# Patient Record
Sex: Female | Born: 1955 | Race: White | Hispanic: No | Marital: Married | Smoking: Former smoker
Health system: Southern US, Community
[De-identification: ages and names within clinical notes are randomized; demographics above are authoritative.]

## PROBLEM LIST (undated history)

## (undated) DIAGNOSIS — I480 Paroxysmal atrial fibrillation: Secondary | ICD-10-CM

## (undated) DIAGNOSIS — I10 Essential (primary) hypertension: Secondary | ICD-10-CM

## (undated) DIAGNOSIS — R011 Cardiac murmur, unspecified: Secondary | ICD-10-CM

## (undated) DIAGNOSIS — E119 Type 2 diabetes mellitus without complications: Secondary | ICD-10-CM

## (undated) DIAGNOSIS — G4730 Sleep apnea, unspecified: Secondary | ICD-10-CM

## (undated) DIAGNOSIS — F419 Anxiety disorder, unspecified: Secondary | ICD-10-CM

## (undated) DIAGNOSIS — M1990 Unspecified osteoarthritis, unspecified site: Secondary | ICD-10-CM

## (undated) DIAGNOSIS — K219 Gastro-esophageal reflux disease without esophagitis: Secondary | ICD-10-CM

## (undated) DIAGNOSIS — E079 Disorder of thyroid, unspecified: Secondary | ICD-10-CM

## (undated) DIAGNOSIS — T7840XA Allergy, unspecified, initial encounter: Secondary | ICD-10-CM

## (undated) DIAGNOSIS — I2510 Atherosclerotic heart disease of native coronary artery without angina pectoris: Secondary | ICD-10-CM

## (undated) DIAGNOSIS — J439 Emphysema, unspecified: Secondary | ICD-10-CM

## (undated) DIAGNOSIS — F988 Other specified behavioral and emotional disorders with onset usually occurring in childhood and adolescence: Secondary | ICD-10-CM

## (undated) HISTORY — PX: TUBAL LIGATION: SHX77

## (undated) HISTORY — DX: Allergy, unspecified, initial encounter: T78.40XA

## (undated) HISTORY — PX: EXCISIONAL HEMORRHOIDECTOMY: SHX1541

## (undated) HISTORY — PX: APPENDECTOMY: SHX54

## (undated) HISTORY — DX: Anxiety disorder, unspecified: F41.9

## (undated) HISTORY — DX: Unspecified osteoarthritis, unspecified site: M19.90

## (undated) HISTORY — DX: Sleep apnea, unspecified: G47.30

## (undated) HISTORY — PX: ABDOMINAL HYSTERECTOMY: SHX81

## (undated) HISTORY — DX: Disorder of thyroid, unspecified: E07.9

## (undated) HISTORY — DX: Emphysema, unspecified: J43.9

## (undated) HISTORY — DX: Gastro-esophageal reflux disease without esophagitis: K21.9

## (undated) HISTORY — PX: INCONTINENCE SURGERY: SHX676

## (undated) HISTORY — DX: Cardiac murmur, unspecified: R01.1

---

## 2011-09-29 ENCOUNTER — Other Ambulatory Visit (HOSPITAL_COMMUNITY): Payer: Self-pay | Admitting: Urology

## 2011-09-29 ENCOUNTER — Ambulatory Visit (HOSPITAL_COMMUNITY)
Admission: RE | Admit: 2011-09-29 | Discharge: 2011-09-29 | Disposition: A | Discharge status: Home / Self Care | Payer: BC Managed Care – PPO | Source: Ambulatory Visit | Attending: Urology | Admitting: Urology

## 2011-09-29 DIAGNOSIS — R109 Unspecified abdominal pain: Secondary | ICD-10-CM | POA: Insufficient documentation

## 2011-09-29 DIAGNOSIS — N200 Calculus of kidney: Secondary | ICD-10-CM

## 2011-11-03 DIAGNOSIS — E663 Overweight: Secondary | ICD-10-CM | POA: Insufficient documentation

## 2011-11-03 DIAGNOSIS — N209 Urinary calculus, unspecified: Secondary | ICD-10-CM | POA: Insufficient documentation

## 2011-11-03 DIAGNOSIS — N200 Calculus of kidney: Secondary | ICD-10-CM | POA: Insufficient documentation

## 2011-11-03 DIAGNOSIS — N393 Stress incontinence (female) (male): Secondary | ICD-10-CM | POA: Insufficient documentation

## 2011-11-03 DIAGNOSIS — K5900 Constipation, unspecified: Secondary | ICD-10-CM | POA: Insufficient documentation

## 2011-11-17 ENCOUNTER — Other Ambulatory Visit (HOSPITAL_COMMUNITY): Payer: Self-pay | Admitting: Urology

## 2011-11-17 DIAGNOSIS — N200 Calculus of kidney: Secondary | ICD-10-CM

## 2011-11-20 ENCOUNTER — Ambulatory Visit (HOSPITAL_COMMUNITY)
Admission: RE | Admit: 2011-11-20 | Discharge: 2011-11-20 | Disposition: A | Discharge status: Home / Self Care | Payer: BC Managed Care – PPO | Source: Ambulatory Visit | Attending: Urology | Admitting: Urology

## 2011-11-20 DIAGNOSIS — R109 Unspecified abdominal pain: Secondary | ICD-10-CM | POA: Insufficient documentation

## 2011-11-20 DIAGNOSIS — N200 Calculus of kidney: Secondary | ICD-10-CM | POA: Insufficient documentation

## 2012-07-09 ENCOUNTER — Encounter (HOSPITAL_COMMUNITY): Payer: Self-pay

## 2012-07-09 ENCOUNTER — Emergency Department (HOSPITAL_COMMUNITY)
Admission: EM | Admit: 2012-07-09 | Discharge: 2012-07-09 | Disposition: A | Discharge status: Home / Self Care | Payer: BC Managed Care – PPO | Attending: Emergency Medicine | Admitting: Emergency Medicine

## 2012-07-09 ENCOUNTER — Emergency Department (HOSPITAL_COMMUNITY): Payer: BC Managed Care – PPO

## 2012-07-09 DIAGNOSIS — R51 Headache: Secondary | ICD-10-CM | POA: Insufficient documentation

## 2012-07-09 DIAGNOSIS — R5383 Other fatigue: Secondary | ICD-10-CM | POA: Insufficient documentation

## 2012-07-09 DIAGNOSIS — Z79899 Other long term (current) drug therapy: Secondary | ICD-10-CM | POA: Insufficient documentation

## 2012-07-09 DIAGNOSIS — R42 Dizziness and giddiness: Secondary | ICD-10-CM | POA: Insufficient documentation

## 2012-07-09 DIAGNOSIS — E119 Type 2 diabetes mellitus without complications: Secondary | ICD-10-CM | POA: Insufficient documentation

## 2012-07-09 DIAGNOSIS — F988 Other specified behavioral and emotional disorders with onset usually occurring in childhood and adolescence: Secondary | ICD-10-CM | POA: Insufficient documentation

## 2012-07-09 DIAGNOSIS — R5381 Other malaise: Secondary | ICD-10-CM | POA: Insufficient documentation

## 2012-07-09 DIAGNOSIS — R55 Syncope and collapse: Secondary | ICD-10-CM | POA: Insufficient documentation

## 2012-07-09 DIAGNOSIS — R0602 Shortness of breath: Secondary | ICD-10-CM | POA: Insufficient documentation

## 2012-07-09 HISTORY — DX: Type 2 diabetes mellitus without complications: E11.9

## 2012-07-09 HISTORY — DX: Other specified behavioral and emotional disorders with onset usually occurring in childhood and adolescence: F98.8

## 2012-07-09 LAB — COMPREHENSIVE METABOLIC PANEL
Albumin: 3.6 g/dL (ref 3.5–5.2)
BUN: 12 mg/dL (ref 6–23)
Chloride: 101 mEq/L (ref 96–112)
Creatinine, Ser: 0.74 mg/dL (ref 0.50–1.10)
GFR calc Af Amer: >90 mL/min (ref >90)
GFR calc non Af Amer: >90 mL/min (ref >90)
Total Bilirubin: 0.2 mg/dL — ABNORMAL LOW (ref 0.3–1.2)

## 2012-07-09 LAB — CBC WITH DIFFERENTIAL/PLATELET
Basophils Relative: 0 % (ref 0–1)
Eosinophils Absolute: 0.1 10*3/uL (ref 0.0–0.7)
HCT: 38.3 % (ref 36.0–46.0)
Hemoglobin: 13.3 g/dL (ref 12.0–15.0)
MCH: 32.5 pg (ref 26.0–34.0)
MCHC: 34.7 g/dL (ref 30.0–36.0)
Monocytes Absolute: 0.6 10*3/uL (ref 0.1–1.0)
Monocytes Relative: 7 % (ref 3–12)
Neutro Abs: 4.3 10*3/uL (ref 1.7–7.7)

## 2012-07-09 LAB — TROPONIN I: Troponin I: <0.3 ng/mL (ref <0.30)

## 2012-07-09 LAB — PROTIME-INR: Prothrombin Time: 12.4 seconds (ref 11.6–15.2)

## 2012-07-09 LAB — MAGNESIUM: Magnesium: 1.6 mg/dL (ref 1.5–2.5)

## 2012-07-09 MED ORDER — SODIUM CHLORIDE 0.9 % IV SOLN
INTRAVENOUS | Status: DC
  Administered 2012-07-09: 18:00:00 via INTRAVENOUS

## 2012-07-09 MED ORDER — KETOROLAC TROMETHAMINE 30 MG/ML IJ SOLN: 30.0000 mg | Freq: Once | INTRAMUSCULAR | Status: DC

## 2012-07-09 MED ORDER — SODIUM CHLORIDE 0.9 % IV BOLUS (SEPSIS)
700.0000 mL | Freq: Once | INTRAVENOUS | Status: AC
  Administered 2012-07-09: 700 mL via INTRAVENOUS

## 2012-07-09 MED ORDER — DIPHENHYDRAMINE HCL 50 MG/ML IJ SOLN
25.0000 mg | Freq: Once | INTRAMUSCULAR | Status: AC
  Administered 2012-07-09: 25 mg via INTRAVENOUS
  Filled 2012-07-09: qty 1

## 2012-07-09 MED ORDER — MECLIZINE HCL 12.5 MG PO TABS
25.0000 mg | ORAL_TABLET | Freq: Once | ORAL | Status: AC
  Administered 2012-07-09: 25 mg via ORAL
  Filled 2012-07-09: qty 2

## 2012-07-09 MED ORDER — METOCLOPRAMIDE HCL 5 MG/ML IJ SOLN
10.0000 mg | Freq: Once | INTRAMUSCULAR | Status: AC
  Administered 2012-07-09: 10 mg via INTRAVENOUS
  Filled 2012-07-09: qty 2

## 2012-07-09 NOTE — ED Provider Notes (Signed)
History   This chart was scribed for [name], MD scribed by Magnus Sinning. The patient was seen in room APA14/[facility]14 at 17:15   CSN: [id number]  Arrival date & time 07/09/12  1658     Chief Complaint  Patient presents with  . Loss of Consciousness    (Consider location/radiation/quality/duration/timing/severity/associated sxs/prior treatment) Patient is a 56 y.o. female presenting with syncope. The history is provided by the patient. No language interpreter was used.  Loss of Consciousness   Rachel Odonnell is a 56 y.o. female who presents to the Emergency Department complaining of sudden onset brief syncopal episode with associated SOB, a current HA located at the back of the head that is more prominent on the right, and current weakness. She says this episode began approximately one hour ago. She says she got up this morning and has felt fine all day. 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 She notes that she had been doing usual work activity all day. Pt explains that she suddenly she felt like she was having lapses in concentration and explains she began seeing "dancing before her eyes and felt that she had no gravity."  She states she purposely leaned back against the wall.   She reports that an inmate said after the episode that  she just slid down the wall and sat down on her bottom. She explains she has no recollection of this event. Pt provides that when she woke up she reached up to grabbed the inmate's hand to help her get up and when she got up she felt her knees buckle.   Denies diaphoresis, CP, pale or flushed skin, nausea, vomiting, diarrhea, numbness, tingling, or blurred/double vision at the time of the dizziness episode.   Patient does report hx of vertigo spells since she was 21.y.o, but she states this situation felt different. She provides that she takes meclizine for vertigo, as well as, janumet, Lipitor, Claritin,  and a fish oil vitamin daily.   The patient explains she walked into the ED with husbands assistance, but states she feels like she has had problems with her balance. She states she still feels weak.  Pt notes hx of similar episode 1 month ago, but says the episode did not last long, at approximately 30 minutes. She says at prior episode, she was at home. She says she had nausea with prior episode and says that she did informed PCP. She states she did call Dr Sherril Croon tonight and he is going to see her in the office on Monday.   She reports having concentration problems and memory problems for a long time and Dr Sherril Croon started her on stratera and she had only had 1 or 2 doses when she had the first syncopal episode. She has now been on adderall a week.   PCP: Dr. Sherril Croon in [place]   Past Medical History  Diagnosis Date  . Diabetes mellitus without complication   . ADD (attention deficit disorder)     Past Surgical History  Procedure Date  . Incontinence surgery     No family history on file. Father had valve replacement with COPD. Currently living at 42 y.o.  Mother died at 7 of Cervical cancer that metastasized.  Sister had a benign tumor on lung, which was removed.   History  Substance Use Topics  . Smoking status: Not on file  . Smokeless tobacco: Not on file  .  Alcohol Use: No  Denies alcohol or smoking tobacco. Currently employed at correctional facility Lives at home Lives with spouse   Review of Systems  Cardiovascular: Positive for syncope.  All other systems reviewed and are negative.  10 Systems reviewed and are negative for acute change except as noted in the HPI.  Allergies  Review of patient's allergies indicates no known allergies.  Home Medications   Current Outpatient Rx  Name  Route  Sig  Dispense  Refill  . AMPHETAMINE-DEXTROAMPHETAMINE 10 MG PO TABS   Oral   Take 10 mg by mouth daily.         . ATORVASTATIN CALCIUM 20 MG PO TABS   Oral   Take 20 mg by  mouth daily.         Marland Kitchen CITALOPRAM HYDROBROMIDE 20 MG PO TABS   Oral   Take 20 mg by mouth daily.         Marland Kitchen ESTRADIOL 2 MG PO TABS   Oral   Take 2 mg by mouth daily.         Marland Kitchen LORATADINE 10 MG PO TABS   Oral   Take 10 mg by mouth daily.         Marland Kitchen SITAGLIPTIN-METFORMIN HCL 50-500 MG PO TABS   Oral   Take 1 tablet by mouth 2 (two) times daily.         Restless leg syndrome medication  BP 133/74  Pulse 62  Temp 98 F (36.7 C) (Oral)  Resp 18  Ht 5\' 4"  (1.626 m)  Wt 210 lb (95.255 kg)  BMI 36.05 kg/m2  SpO2 96%  Vital signs normal   Orthostatic VS normal with bradycardia   Physical Exam  Nursing note and vitals reviewed. Constitutional: She is oriented to person, place, and time. She appears well-developed and well-nourished. No distress.  HENT:  Head: Normocephalic and atraumatic.  Right Ear: External ear normal.  Left Ear: External ear normal.  Nose: Nose normal.  Mouth/Throat: Oropharynx is clear and moist.  Eyes: Conjunctivae normal and EOM are normal. Pupils are equal, round, and reactive to light.  Neck: Normal range of motion. Neck supple. No tracheal deviation present.       No carotid bruits  Cardiovascular: Normal rate.   Murmur heard.      Late systolic soft murmur. No carotid bruits.   Pulmonary/Chest: Effort normal and breath sounds normal. No respiratory distress. She has no wheezes. She has no rales. She exhibits no tenderness.  Abdominal: Soft. Bowel sounds are normal. She exhibits no distension. There is no tenderness. There is no rebound and no guarding.  Musculoskeletal: Normal range of motion. She exhibits no edema and no tenderness.  Neurological: She is alert and oriented to person, place, and time. No cranial nerve deficit or sensory deficit. Coordination normal.       No focal weakness. FTN was normal bilaterally. Grips equal bilaterally  Skin: Skin is warm and dry. No rash noted. No erythema. No pallor.  Psychiatric: She has a  normal mood and affect. Her behavior is normal.    ED Course  Procedures (including critical care time)   Medications  0.9 %  sodium chloride infusion (0  Intravenous Stopped 07/09/12 2016)  sodium chloride 0.9 % bolus 700 mL (0 mL Intravenous Stopped 07/09/12 2011)  meclizine (ANTIVERT) tablet 25 mg (25 mg Oral Given 07/09/12 1847)  metoCLOPramide (REGLAN) injection 10 mg (10 mg Intravenous Given 07/09/12 2016)  diphenhydrAMINE (BENADRYL) injection 25 mg (25  mg Intravenous Given 07/09/12 2016)     DIAGNOSTIC STUDIES: Oxygen Saturation is 96% on room air, normal by my interpretation.    COORDINATION OF CARE: 18:54: Performed recheck and provided that labs and radiology were all nml. Pt provides she walked to the bathroom unassisted and that she has felt better.   20:00 recheck patient ambulated to the bathroom easily. States her headache is still present.  20:40 nurse reports her headache is gone. Pt ready to go home.    Results for orders placed during the hospital encounter of 07/09/12  CBC WITH DIFFERENTIAL      Component Value Range   WBC 9.2  4.0 - 10.5 K/uL   RBC 4.09  3.87 - 5.11 MIL/uL   Hemoglobin 13.3  12.0 - 15.0 g/dL   HCT 16.1  09.6 - 04.5 %   MCV 93.6  78.0 - 100.0 fL   MCH 32.5  26.0 - 34.0 pg   MCHC 34.7  30.0 - 36.0 g/dL   RDW 40.9  81.1 - 91.4 %   Platelets 311  150 - 400 K/uL   Neutrophils Relative 47  43 - 77 %   Neutro Abs 4.3  1.7 - 7.7 K/uL   Lymphocytes Relative 45  12 - 46 %   Lymphs Abs 4.1 (*) 0.7 - 4.0 K/uL   Monocytes Relative 7  3 - 12 %   Monocytes Absolute 0.6  0.1 - 1.0 K/uL   Eosinophils Relative 1  0 - 5 %   Eosinophils Absolute 0.1  0.0 - 0.7 K/uL   Basophils Relative 0  0 - 1 %   Basophils Absolute 0.0  0.0 - 0.1 K/uL  COMPREHENSIVE METABOLIC PANEL      Component Value Range   Sodium 137  135 - 145 mEq/L   Potassium 3.9  3.5 - 5.1 mEq/L   Chloride 101  96 - 112 mEq/L   CO2 25  19 - 32 mEq/L   Glucose, Bld 146 (*) 70 - 99  mg/dL   BUN 12  6 - 23 mg/dL   Creatinine, Ser 7.82  0.50 - 1.10 mg/dL   Calcium 95.6  8.4 - 21.3 mg/dL   Total Protein 7.0  6.0 - 8.3 g/dL   Albumin 3.6  3.5 - 5.2 g/dL   AST 24  0 - 37 U/L   ALT 23  0 - 35 U/L   Alkaline Phosphatase 72  39 - 117 U/L   Total Bilirubin 0.2 (*) 0.3 - 1.2 mg/dL   GFR calc non Af Amer >90  >90 mL/min   GFR calc Af Amer >90  >90 mL/min  TROPONIN I      Component Value Range   Troponin I <0.30  <0.30 ng/mL  MAGNESIUM      Component Value Range   Magnesium 1.6  1.5 - 2.5 mg/dL  D-DIMER, QUANTITATIVE      Component Value Range   D-Dimer, Quant 0.40  0.00 - 0.48 ug/mL-FEU  APTT      Component Value Range   aPTT 26  24 - 37 seconds  PROTIME-INR      Component Value Range   Prothrombin Time 12.4  11.6 - 15.2 seconds   INR 0.93  0.00 - 1.49   Laboratory interpretation all normal except hyperglycemia   Dg Chest 1 View  07/09/2012  *RADIOLOGY REPORT*  Clinical Data: 56 year old female with syncope.  Altered level of consciousness.  CHEST - 1 VIEW  Comparison: CT abdomen  11/20/2011.  Findings: AP upright view at 1800 hours.  Lung volumes are within normal limits.  Cardiac size and mediastinal contours are within normal limits.  Visualized tracheal air column is within normal limits.  Allowing for portable technique, the lungs are clear.  No pneumothorax or effusion.  IMPRESSION: No acute cardiopulmonary abnormality.   Original Report Authenticated By: Erskine Speed, M.D.    Ct Head Wo Contrast  07/09/2012  *RADIOLOGY REPORT*  Clinical Data: 56 year old female syncope altered level of consciousness, weakness.  CT HEAD WITHOUT CONTRAST  Technique:  Contiguous axial images were obtained from the base of the skull through the vertex without contrast.  Comparison: None.  Findings: Visualized paranasal sinuses and mastoids are clear. Visualized orbits and scalp soft tissues are within normal limits. No acute osseous abnormality identified.  Mild calcified  atherosclerosis at the skull base.  Cerebral volume is within normal limits for age.  No midline shift, ventriculomegaly, mass effect, evidence of mass lesion, intracranial hemorrhage or evidence of cortically based acute infarction.  Gray-Adduci matter differentiation is within normal limits throughout the brain.  No suspicious intracranial vascular hyperdensity.  IMPRESSION: Normal noncontrast CT appearance of the brain.   Original Report Authenticated By: Erskine Speed, M.D.     Date: 07/09/2012  Rate: 63  Rhythm: normal sinus rhythm  QRS Axis: normal  Intervals: normal  ST/T Wave abnormalities: normal  Conduction Disutrbances:none  Narrative Interpretation:   Old EKG Reviewed: none available     1. Syncopal episodes   2. Headache     Plan discharge  Devoria Albe, MD, FACEP   MDM  I personally performed the services described in this documentation, which was scribed in my presence. The recorded information has been reviewed and considered.  Devoria Albe, MD, Armando Gang         [name], MD 07/09/12 [phone]

## 2012-07-09 NOTE — ED Notes (Signed)
Pt states she has a sudden onset of dizziness while at work. States she slid down the wall and everything went black. States she has a headache now and is unable to concentrate

## 2012-07-29 ENCOUNTER — Other Ambulatory Visit (HOSPITAL_COMMUNITY): Payer: Self-pay | Admitting: Internal Medicine

## 2012-07-29 ENCOUNTER — Ambulatory Visit (HOSPITAL_COMMUNITY)
Admission: RE | Admit: 2012-07-29 | Discharge: 2012-07-29 | Disposition: A | Discharge status: Home / Self Care | Payer: BC Managed Care – PPO | Source: Ambulatory Visit | Attending: Internal Medicine | Admitting: Internal Medicine

## 2012-07-29 DIAGNOSIS — R109 Unspecified abdominal pain: Secondary | ICD-10-CM

## 2012-07-29 DIAGNOSIS — K7689 Other specified diseases of liver: Secondary | ICD-10-CM | POA: Insufficient documentation

## 2012-08-02 ENCOUNTER — Other Ambulatory Visit (HOSPITAL_COMMUNITY): Payer: Self-pay | Admitting: Internal Medicine

## 2012-08-02 DIAGNOSIS — R109 Unspecified abdominal pain: Secondary | ICD-10-CM

## 2012-08-03 ENCOUNTER — Encounter (HOSPITAL_COMMUNITY)
Admission: RE | Admit: 2012-08-03 | Discharge: 2012-08-03 | Disposition: A | Discharge status: Home / Self Care | Payer: BC Managed Care – PPO | Source: Ambulatory Visit | Attending: Internal Medicine | Admitting: Internal Medicine

## 2012-08-03 ENCOUNTER — Other Ambulatory Visit (HOSPITAL_COMMUNITY): Payer: Self-pay | Admitting: Internal Medicine

## 2012-08-03 ENCOUNTER — Encounter (HOSPITAL_COMMUNITY): Payer: Self-pay

## 2012-08-03 DIAGNOSIS — R109 Unspecified abdominal pain: Secondary | ICD-10-CM | POA: Insufficient documentation

## 2012-08-03 HISTORY — DX: Essential (primary) hypertension: I10

## 2012-08-03 MED ORDER — TECHNETIUM TC 99M MEBROFENIN IV KIT
5.0000 | PACK | Freq: Once | INTRAVENOUS | Status: AC | PRN
  Administered 2012-08-03: 5 via INTRAVENOUS

## 2012-08-03 MED ORDER — MORPHINE SULFATE 2 MG/ML IJ SOLN
2.0000 mg | Freq: Once | INTRAMUSCULAR | Status: AC
  Administered 2012-08-03: 2 mg via INTRAVENOUS

## 2013-02-12 IMAGING — CR DG CHEST 1V
1 series · 1 of 1 positions shown · non-contrast
Comparison: CT abdomen 11/20/2011.

CLINICAL DATA: 56-year-old female with syncope.  Altered level of
consciousness.

CHEST - 1 VIEW

[view not recorded]
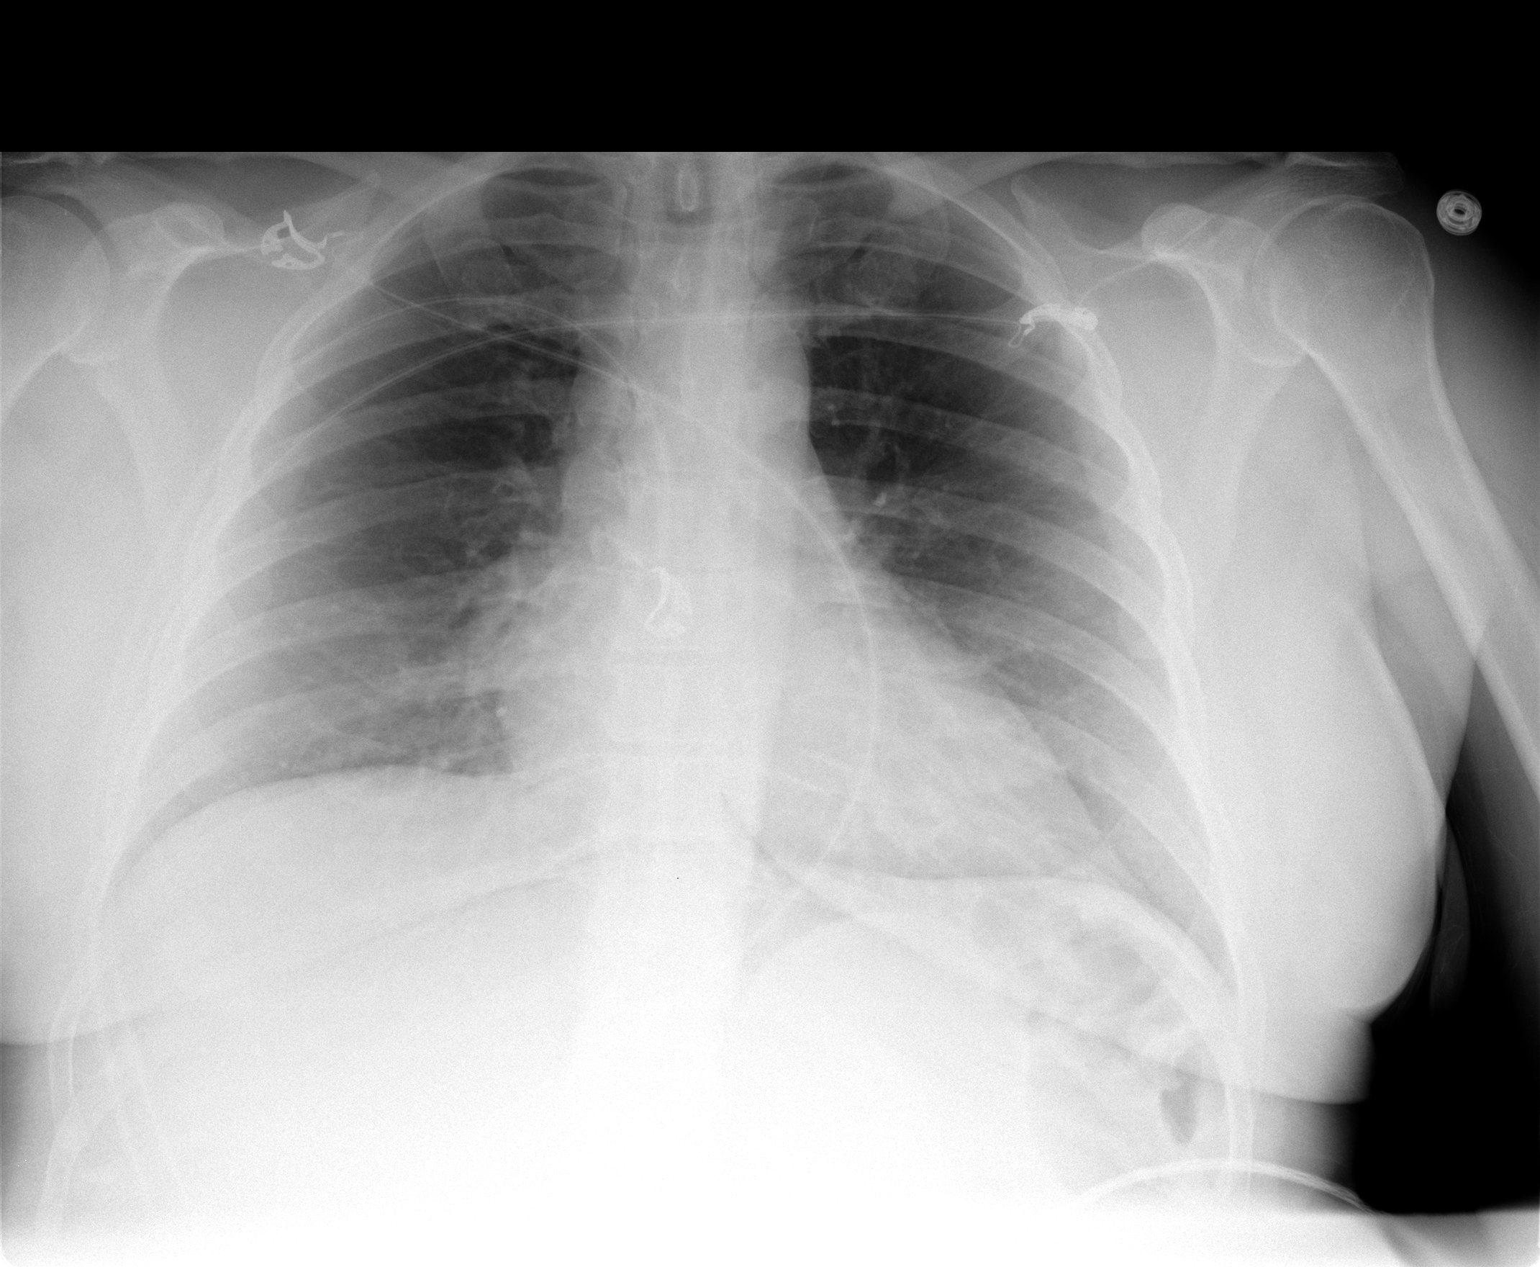

[1 of 1 positions shown; findings below may reference images not displayed]

FINDINGS: AP upright view at 3300 hours.  Lung volumes are within
normal limits.  Cardiac size and mediastinal contours are within
normal limits.  Visualized tracheal air column is within normal
limits.  Allowing for portable technique, the lungs are clear.  No
pneumothorax or effusion.
IMPRESSION: No acute cardiopulmonary abnormality.

## 2016-05-31 DIAGNOSIS — I10 Essential (primary) hypertension: Secondary | ICD-10-CM | POA: Insufficient documentation

## 2016-05-31 DIAGNOSIS — E119 Type 2 diabetes mellitus without complications: Secondary | ICD-10-CM | POA: Insufficient documentation

## 2016-05-31 DIAGNOSIS — E1159 Type 2 diabetes mellitus with other circulatory complications: Secondary | ICD-10-CM | POA: Insufficient documentation

## 2016-05-31 DIAGNOSIS — G43109 Migraine with aura, not intractable, without status migrainosus: Secondary | ICD-10-CM | POA: Insufficient documentation

## 2016-05-31 DIAGNOSIS — E1165 Type 2 diabetes mellitus with hyperglycemia: Secondary | ICD-10-CM | POA: Insufficient documentation

## 2018-03-03 ENCOUNTER — Encounter: Payer: Self-pay | Admitting: *Deleted

## 2018-03-03 ENCOUNTER — Other Ambulatory Visit: Payer: Self-pay

## 2018-03-03 ENCOUNTER — Ambulatory Visit (INDEPENDENT_AMBULATORY_CARE_PROVIDER_SITE_OTHER): Payer: BC Managed Care – PPO | Admitting: Cardiovascular Disease

## 2018-03-03 ENCOUNTER — Encounter: Payer: Self-pay | Admitting: Cardiovascular Disease

## 2018-03-03 VITALS — BP 112/67 | HR 69 | Ht 64.0 in | Wt 216.0 lb

## 2018-03-03 DIAGNOSIS — R079 Chest pain, unspecified: Secondary | ICD-10-CM | POA: Diagnosis not present

## 2018-03-03 DIAGNOSIS — R5383 Other fatigue: Secondary | ICD-10-CM | POA: Diagnosis not present

## 2018-03-03 DIAGNOSIS — R0609 Other forms of dyspnea: Secondary | ICD-10-CM

## 2018-03-03 DIAGNOSIS — E119 Type 2 diabetes mellitus without complications: Secondary | ICD-10-CM | POA: Diagnosis not present

## 2018-03-03 NOTE — Patient Instructions (Signed)
Medication Instructions:  Continue all current medications.  Labwork: none  Testing/Procedures:  Your physician has requested that you have a lexiscan myoview. For further information please visit [email or web address]. Please follow instruction sheet, as given.  Office will contact with results via phone or letter.    Follow-Up: Next available.    Any Other Special Instructions Will Be Listed Below (If Applicable).  If you need a refill on your cardiac medications before your next appointment, please call your pharmacy.  

## 2018-03-03 NOTE — Progress Notes (Signed)
CARDIOLOGY CONSULT NOTE  Patient ID: Rachel Odonnell MRN: [id number] DOB/AGE: 1956/02/16 62 y.o.  Admit date: (Not on file) Primary Physician: Ignatius SpeckingVyas, Dhruv B, MD Referring Physician: Ignatius SpeckingVyas, Dhruv B, MD  Reason for Consultation: Shortness of breath  HPI: Rachel KrebsKathy M Nesheim is a 62 y.o. female who is being seen today for the evaluation of shortness of breath at the request of Vyas, Angelina Pihhruv B, MD.   I reviewed records from her PCP.  She reported the underwent a normal stress test in June 2011 and a normal echocardiogram in March 2010.  I reviewed the echocardiogram report dated 12/10/2008.  [place] was performed in [place].  There appeared to be normal left ventricular systolic function and normal chamber size.  All valves were also normal.  I personally reviewed an ECG performed on 02/06/2010 which demonstrated sinus rhythm with nonspecific T wave abnormalities.  I reviewed carotid Dopplers dated 06/08/2016 which showed less than 50% bilateral internal carotid artery stenosis.  I personally reviewed another ECG performed on 02/14/2018 which demonstrated sinus rhythm with septal Q waves and nonspecific T wave abnormalities in leads III and aVF as well as V6.  She has been experiencing exertional dyspnea for the past month.  This can occur both at rest and with exertion or when going from the sitting to standing position.  She feels dizzy and feels like she is going to pass out.  She has been fatigued.  She even gets short of breath with talking which she began expensing about 3 weeks ago.  She works part-time [facility] [employer] and has had to take herself out of work.  She has occasional sharp retrosternal chest pains lasting seconds.  She has also had bilateral leg and feet swelling.  She has restless leg syndrome as well.  She has a history of type 2 diabetes mellitus and her most recent A1c was 8.9%.  This morning when helping her daughter low teaching supplies into the car she felt short of  breath and had to sit down.  She quit smoking in 1996.  She tells me she underwent an echocardiogram, chest x-ray, and had labs done all within the past several weeks.  I will have to request a copy of these results.  Family history: Father underwent CABG in his late 60s/early 1570s.  He died at the age of 63.  He had COPD.  Her mother had cancer.  Social history: [place] in [place].  She is divorced and remarried.  She quit smoking in 1996.   Allergies  Allergen Reactions  . Azithromycin Hives  . Codeine Other (See Comments)  . Atomoxetine Nausea And Vomiting  . Atorvastatin Other (See Comments)    Restless legs  . Lisinopril Nausea And Vomiting    Current Outpatient Medications  Medication Sig Dispense Refill  . citalopram (CELEXA) 20 MG tablet Take 20 mg by mouth daily.    Marland Kitchen. estradiol (ESTRACE) 2 MG tablet Take 2 mg by mouth daily.    Marland Kitchen. loratadine (CLARITIN) 10 MG tablet Take 10 mg by mouth daily.    . sitaGLIPtan-metformin (JANUMET) 50-500 MG per tablet Take 1 tablet by mouth 2 (two) times daily.    . TRULICITY 0.75 MG/0.5ML SOPN     . amphetamine-dextroamphetamine (ADDERALL) 10 MG tablet Take 10 mg by mouth daily.     No current facility-administered medications for this visit.     Past Medical History:  Diagnosis Date  .  ADD (attention deficit disorder)   . Diabetes mellitus without complication (HCC)   . Hypertension     Past Surgical History:  Procedure Laterality Date  . INCONTINENCE SURGERY      Social History   Socioeconomic History  . Marital status: Married    Spouse name: Not on file  . Number of children: Not on file  . Years of education: Not on file  . Highest education level: Not on file  Occupational History  . Not on file  Social Needs  . Financial resource strain: Not on file  . Food insecurity:    Worry: Not on file    Inability: Not on file  . Transportation needs:    Medical: Not on file    Non-medical: Not on file    Tobacco Use  . Smoking status: Never Smoker  . Smokeless tobacco: Never Used  Substance and Sexual Activity  . Alcohol use: No  . Drug use: No  . Sexual activity: Not on file  Lifestyle  . Physical activity:    Days per week: Not on file    Minutes per session: Not on file  . Stress: Not on file  Relationships  . Social connections:    Talks on phone: Not on file    Gets together: Not on file    Attends religious service: Not on file    Active member of club or organization: Not on file    Attends meetings of clubs or organizations: Not on file    Relationship status: Not on file  . Intimate partner violence:    Fear of current or ex partner: Not on file    Emotionally abused: Not on file    Physically abused: Not on file    Forced sexual activity: Not on file  Other Topics Concern  . Not on file  Social History Narrative  . Not on file     Current Meds  Medication Sig  . citalopram (CELEXA) 20 MG tablet Take 20 mg by mouth daily.  Marland Kitchen estradiol (ESTRACE) 2 MG tablet Take 2 mg by mouth daily.  Marland Kitchen loratadine (CLARITIN) 10 MG tablet Take 10 mg by mouth daily.  . sitaGLIPtan-metformin (JANUMET) 50-500 MG per tablet Take 1 tablet by mouth 2 (two) times daily.  . TRULICITY 0.75 MG/0.5ML SOPN       Review of systems complete and found to be negative unless listed above in HPI    Physical exam Height 5\' 4"  (1.626 m), weight 216 lb (98 kg). General: NAD Neck: No JVD, no thyromegaly or thyroid nodule.  Lungs: Clear to auscultation bilaterally with normal respiratory effort. CV: Nondisplaced PMI. Regular rate and rhythm, normal S1/S2, no S3/S4, 2/6 systolic murmur over bilateral upper sternal borders, right greater than left.  No peripheral edema.  No carotid bruit.   Abdomen: Soft, nontender, no distention.  Skin: Intact without lesions or rashes.  Neurologic: Alert and oriented x 3.  Psych: Normal affect. Extremities: No clubbing or cyanosis.  HEENT: Normal.   ECG:  Most recent ECG reviewed.   Labs: Lab Results  Component Value Date/Time   K 3.9 07/09/2012 05:36 PM   BUN 12 07/09/2012 05:36 PM   CREATININE 0.74 07/09/2012 05:36 PM   ALT 23 07/09/2012 05:36 PM   HGB 13.3 07/09/2012 05:36 PM     Lipids: No results found for: LDLCALC, LDLDIRECT, CHOL, TRIG, HDL      ASSESSMENT AND PLAN:  1.  Exertional dyspnea with intermittent chest pains  and fatigue: Uncertain etiology but she carries no history of pulmonary disease and quit smoking decades ago.  Given her history of type 2 diabetes mellitus, she is certainly at risk for ischemic heart disease. I will proceed with a nuclear myocardial perfusion imaging study to evaluate for ischemic heart disease (Lexiscan Myoview). I will also request echocardiogram report, chest x-ray report, and copy of most recent labs from her PCP.  2. Type 2 diabetes mellitus: This is poorly controlled with most recent A1c of 8.9%.  She takes Janumet and Trulicity.     Disposition: Follow up in 2 months   Signed: Prentice Docker, M.D., F.A.C.C.  03/03/2018, 1:56 PM

## 2018-03-04 ENCOUNTER — Telehealth: Payer: Self-pay | Admitting: Cardiovascular Disease

## 2018-03-04 NOTE — Telephone Encounter (Signed)
Pre-cert Verification for the following procedure   [facility]

## 2018-03-11 ENCOUNTER — Encounter (HOSPITAL_BASED_OUTPATIENT_CLINIC_OR_DEPARTMENT_OTHER)
Admission: RE | Admit: 2018-03-11 | Discharge: 2018-03-11 | Disposition: A | Discharge status: Home / Self Care | Payer: BC Managed Care – PPO | Source: Ambulatory Visit | Attending: Cardiovascular Disease | Admitting: Cardiovascular Disease

## 2018-03-11 ENCOUNTER — Encounter (HOSPITAL_COMMUNITY): Payer: Self-pay

## 2018-03-11 ENCOUNTER — Ambulatory Visit (HOSPITAL_COMMUNITY)
Admission: RE | Admit: 2018-03-11 | Discharge: 2018-03-11 | Disposition: A | Discharge status: Home / Self Care | Payer: BC Managed Care – PPO | Source: Ambulatory Visit | Attending: Cardiovascular Disease | Admitting: Cardiovascular Disease

## 2018-03-11 DIAGNOSIS — R0609 Other forms of dyspnea: Secondary | ICD-10-CM

## 2018-03-11 LAB — NM MYOCAR MULTI W/SPECT W/WALL MOTION / EF
Peak HR: 88 {beats}/min
Rest HR: 61 {beats}/min

## 2018-03-11 MED ORDER — TECHNETIUM TC 99M TETROFOSMIN IV KIT
30.0000 | PACK | Freq: Once | INTRAVENOUS | Status: AC | PRN
  Administered 2018-03-11: 32 via INTRAVENOUS

## 2018-03-11 MED ORDER — SODIUM CHLORIDE 0.9% FLUSH
INTRAVENOUS | Status: AC
  Administered 2018-03-11: 10 mL via INTRAVENOUS
  Filled 2018-03-11: qty 10

## 2018-03-11 MED ORDER — TECHNETIUM TC 99M TETROFOSMIN IV KIT
10.0000 | PACK | Freq: Once | INTRAVENOUS | Status: AC | PRN
  Administered 2018-03-11: 9.89 via INTRAVENOUS

## 2018-03-11 MED ORDER — REGADENOSON 0.4 MG/5ML IV SOLN
INTRAVENOUS | Status: AC
  Administered 2018-03-11: 0.4 mg via INTRAVENOUS
  Filled 2018-03-11: qty 5

## 2018-03-14 ENCOUNTER — Telehealth: Payer: Self-pay

## 2018-03-14 NOTE — Telephone Encounter (Signed)
Patient notified. Routed to PCP 

## 2018-03-14 NOTE — Telephone Encounter (Signed)
-----   Message from Antoine PocheJonathan F Branch, MD sent at 03/14/2018  1:41 PM EDT ----- Stress test looks good, no evidence of blockages. Dr Kirtland BouchardK to discuss in detail at f/u next week, reassess symptoms   J BrancH MD

## 2018-03-21 ENCOUNTER — Encounter: Payer: Self-pay | Admitting: Cardiovascular Disease

## 2018-03-21 ENCOUNTER — Ambulatory Visit (INDEPENDENT_AMBULATORY_CARE_PROVIDER_SITE_OTHER): Payer: BC Managed Care – PPO | Admitting: Cardiovascular Disease

## 2018-03-21 VITALS — BP 122/64 | HR 64 | Ht 64.0 in | Wt 209.0 lb

## 2018-03-21 DIAGNOSIS — E119 Type 2 diabetes mellitus without complications: Secondary | ICD-10-CM

## 2018-03-21 DIAGNOSIS — R0609 Other forms of dyspnea: Secondary | ICD-10-CM | POA: Diagnosis not present

## 2018-03-21 DIAGNOSIS — R5383 Other fatigue: Secondary | ICD-10-CM

## 2018-03-21 DIAGNOSIS — R079 Chest pain, unspecified: Secondary | ICD-10-CM | POA: Diagnosis not present

## 2018-03-21 NOTE — Patient Instructions (Signed)
Medication Instructions:  Continue all current medications.  Labwork: none  Testing/Procedures: none  Follow-Up: As needed.    Any Other Special Instructions Will Be Listed Below (If Applicable).  If you need a refill on your cardiac medications before your next appointment, please call your pharmacy.  

## 2018-03-21 NOTE — Progress Notes (Signed)
SUBJECTIVE: The patient returns for follow-up after undergoing cardiovascular testing performed for the evaluation of chest pain and exertional dyspnea.  Nuclear stress test on 03/11/2018 was normal, EF 55 to 65%.  Since her last visit with me, she has had one mild episode of retrosternal chest tightness radiating down the left arm.  Her exertional dyspnea has improved.  She had a sleep study several years ago and was told she did not have sleep apnea.  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  She believes this is taken a toll on her mental health.     Review of Systems: As per "subjective", otherwise negative.  Allergies  Allergen Reactions  . Azithromycin Hives  . Codeine Other (See Comments)  . Atomoxetine Nausea And Vomiting  . Atorvastatin Other (See Comments)    Restless legs  . Lisinopril Nausea And Vomiting    Current Outpatient Medications  Medication Sig Dispense Refill  . amphetamine-dextroamphetamine (ADDERALL) 10 MG tablet Take 10 mg by mouth daily.    . citalopram (CELEXA) 20 MG tablet Take 20 mg by mouth daily.    . dapagliflozin propanediol (FARXIGA) 10 MG TABS tablet Take 10 mg by mouth daily.    Marland Kitchen estradiol (ESTRACE) 2 MG tablet Take 2 mg by mouth daily.    . fenofibrate (TRICOR) 145 MG tablet Take 145 mg by mouth daily.    Marland Kitchen levocetirizine (XYZAL) 5 MG tablet Take 5 mg by mouth every evening.    . loratadine (CLARITIN) 10 MG tablet Take 10 mg by mouth daily.    . montelukast (SINGULAIR) 10 MG tablet Take 10 mg by mouth at bedtime.    Marland Kitchen omeprazole (PRILOSEC) 40 MG capsule Take 40 mg by mouth daily.    . pregabalin (LYRICA) 50 MG capsule Take 50 mg by mouth daily.    Marland Kitchen rOPINIRole (REQUIP) 2 MG tablet Take 2 mg by mouth at bedtime.    . sitaGLIPtan-metformin (JANUMET) 50-500 MG per tablet Take 1 tablet by mouth 2 (two) times daily.    . TRULICITY 0.75  MG/0.5ML SOPN      No current facility-administered medications for this visit.     Past Medical History:  Diagnosis Date  . ADD (attention deficit disorder)   . Diabetes mellitus without complication (HCC)   . Hypertension     Past Surgical History:  Procedure Laterality Date  . INCONTINENCE SURGERY      Social History   Socioeconomic History  . Marital status: Married    Spouse name: Not on file  . Number of children: Not on file  . Years of education: Not on file  . Highest education level: Not on file  Occupational History  . Not on file  Social Needs  . Financial resource strain: Not on file  . Food insecurity:    Worry: Not on file    Inability: Not on file  . Transportation needs:    Medical: Not on file    Non-medical: Not on file  Tobacco Use  . Smoking status: Never Smoker  . Smokeless tobacco: Never Used  Substance and Sexual Activity  . Alcohol use: No  . Drug use: No  . Sexual activity: Not on file  Lifestyle  . Physical activity:    Days per week: Not on file    Minutes per session: Not on file  .  Stress: Not on file  Relationships  . Social connections:    Talks on phone: Not on file    Gets together: Not on file    Attends religious service: Not on file    Active member of club or organization: Not on file    Attends meetings of clubs or organizations: Not on file    Relationship status: Not on file  . Intimate partner violence:    Fear of current or ex partner: Not on file    Emotionally abused: Not on file    Physically abused: Not on file    Forced sexual activity: Not on file  Other Topics Concern  . Not on file  Social History Narrative  . Not on file     Vitals:   03/21/18 1333  BP: 122/64  Pulse: 64  SpO2: 98%  Weight: 209 lb (94.8 kg)  Height: 5\' 4"  (1.626 m)    Wt Readings from Last 3 Encounters:  03/21/18 209 lb (94.8 kg)  03/03/18 216 lb (98 kg)  07/09/12 210 lb (95.3 kg)     PHYSICAL EXAM General:  NAD HEENT: Normal. Neck: No JVD, no thyromegaly. Lungs: Clear to auscultation bilaterally with normal respiratory effort. CV: Regular rate and rhythm, normal S1/S2, no S3/S4, no murmur. No pretibial or periankle edema.  No carotid bruit.   Abdomen: Soft, nontender, no distention.  Neurologic: Alert and oriented.  Psych: Normal affect. Skin: Normal. Musculoskeletal: No gross deformities.    ECG: Reviewed above under Subjective   Labs: Lab Results  Component Value Date/Time   K 3.9 07/09/2012 05:36 PM   BUN 12 07/09/2012 05:36 PM   CREATININE 0.74 07/09/2012 05:36 PM   ALT 23 07/09/2012 05:36 PM   HGB 13.3 07/09/2012 05:36 PM     Lipids: No results found for: LDLCALC, LDLDIRECT, CHOL, TRIG, HDL     ASSESSMENT AND PLAN: 1.  Exertional dyspnea with intermittent chest pains: Nuclear stress test was normal as noted above.  Symptoms have improved since her last visit.  I told her to contact me should she have recurrent and progressive symptoms.  2.  Type 2 diabetes mellitus: This is poorly controlled with most recent A1c of 8.9%.  She takes Janumet and Trulicity.      Disposition: Follow up prn   Prentice DockerSuresh Koneswaran, M.D., F.A.C.C.

## 2018-10-15 IMAGING — NM NM MYOCAR MULTI W/SPECT W/WALL MOTION & EF
2 series · 12 of 12 positions shown · non-contrast
Comparison: none

[Series 1: rest · 6.51mm/px · 6 of 64 frames shown]
[frame 6/64]
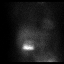
[frame 16/64]
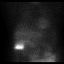
[frame 27/64]
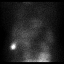
[frame 38/64]
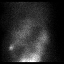
[frame 48/64]
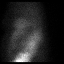
[frame 59/64]
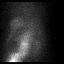

[Series 3: stress gated - perfusion · 6.51mm/px · 6 of 64 frames shown]
[frame 6/64]
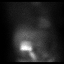
[frame 16/64]
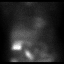
[frame 27/64]
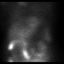
[frame 38/64]
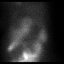
[frame 48/64]
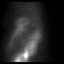
[frame 59/64]
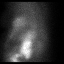

[12 of 12 positions shown; findings below may reference images not displayed]

Canned report from images found in remote index.

Refer to host system for actual result text.

## 2019-02-13 ENCOUNTER — Other Ambulatory Visit: Payer: Self-pay

## 2019-02-13 ENCOUNTER — Other Ambulatory Visit: Payer: Self-pay | Admitting: Internal Medicine

## 2019-02-13 DIAGNOSIS — Z20822 Contact with and (suspected) exposure to covid-19: Secondary | ICD-10-CM

## 2019-02-18 LAB — NOVEL CORONAVIRUS, NAA: SARS-CoV-2, NAA: NOT DETECTED

## 2019-11-28 ENCOUNTER — Institutional Professional Consult (permissible substitution): Payer: BC Managed Care – PPO | Admitting: Neurology

## 2020-05-22 DIAGNOSIS — Z87891 Personal history of nicotine dependence: Secondary | ICD-10-CM | POA: Diagnosis not present

## 2020-05-22 DIAGNOSIS — S60222A Contusion of left hand, initial encounter: Secondary | ICD-10-CM | POA: Diagnosis not present

## 2020-05-22 DIAGNOSIS — Z23 Encounter for immunization: Secondary | ICD-10-CM | POA: Diagnosis not present

## 2020-05-22 DIAGNOSIS — S80212A Abrasion, left knee, initial encounter: Secondary | ICD-10-CM | POA: Diagnosis not present

## 2020-05-22 DIAGNOSIS — W010XXA Fall on same level from slipping, tripping and stumbling without subsequent striking against object, initial encounter: Secondary | ICD-10-CM | POA: Diagnosis not present

## 2020-05-22 DIAGNOSIS — S0990XA Unspecified injury of head, initial encounter: Secondary | ICD-10-CM | POA: Diagnosis not present

## 2020-05-22 DIAGNOSIS — S60221A Contusion of right hand, initial encounter: Secondary | ICD-10-CM | POA: Diagnosis not present

## 2020-05-22 DIAGNOSIS — M542 Cervicalgia: Secondary | ICD-10-CM | POA: Diagnosis not present

## 2020-05-22 DIAGNOSIS — M79642 Pain in left hand: Secondary | ICD-10-CM | POA: Diagnosis not present

## 2020-05-27 DIAGNOSIS — R69 Illness, unspecified: Secondary | ICD-10-CM | POA: Diagnosis not present

## 2020-05-28 DIAGNOSIS — Z0001 Encounter for general adult medical examination with abnormal findings: Secondary | ICD-10-CM | POA: Diagnosis not present

## 2020-05-28 DIAGNOSIS — Z1329 Encounter for screening for other suspected endocrine disorder: Secondary | ICD-10-CM | POA: Diagnosis not present

## 2020-05-28 DIAGNOSIS — E039 Hypothyroidism, unspecified: Secondary | ICD-10-CM | POA: Diagnosis not present

## 2020-05-28 DIAGNOSIS — Z131 Encounter for screening for diabetes mellitus: Secondary | ICD-10-CM | POA: Diagnosis not present

## 2020-05-28 DIAGNOSIS — E119 Type 2 diabetes mellitus without complications: Secondary | ICD-10-CM | POA: Diagnosis not present

## 2020-05-28 DIAGNOSIS — Z13220 Encounter for screening for lipoid disorders: Secondary | ICD-10-CM | POA: Diagnosis not present

## 2020-05-28 DIAGNOSIS — E782 Mixed hyperlipidemia: Secondary | ICD-10-CM | POA: Diagnosis not present

## 2020-05-31 DIAGNOSIS — R69 Illness, unspecified: Secondary | ICD-10-CM | POA: Diagnosis not present

## 2020-05-31 DIAGNOSIS — Z6836 Body mass index (BMI) 36.0-36.9, adult: Secondary | ICD-10-CM | POA: Diagnosis not present

## 2020-05-31 DIAGNOSIS — K219 Gastro-esophageal reflux disease without esophagitis: Secondary | ICD-10-CM | POA: Diagnosis not present

## 2020-05-31 DIAGNOSIS — E785 Hyperlipidemia, unspecified: Secondary | ICD-10-CM | POA: Diagnosis not present

## 2020-05-31 DIAGNOSIS — Z0000 Encounter for general adult medical examination without abnormal findings: Secondary | ICD-10-CM | POA: Diagnosis not present

## 2020-05-31 DIAGNOSIS — J309 Allergic rhinitis, unspecified: Secondary | ICD-10-CM | POA: Diagnosis not present

## 2020-05-31 DIAGNOSIS — Z23 Encounter for immunization: Secondary | ICD-10-CM | POA: Diagnosis not present

## 2020-05-31 DIAGNOSIS — E1165 Type 2 diabetes mellitus with hyperglycemia: Secondary | ICD-10-CM | POA: Diagnosis not present

## 2020-06-13 DIAGNOSIS — Z23 Encounter for immunization: Secondary | ICD-10-CM | POA: Diagnosis not present

## 2020-07-03 DIAGNOSIS — H2513 Age-related nuclear cataract, bilateral: Secondary | ICD-10-CM | POA: Diagnosis not present

## 2020-07-03 DIAGNOSIS — H524 Presbyopia: Secondary | ICD-10-CM | POA: Diagnosis not present

## 2020-07-03 DIAGNOSIS — H5203 Hypermetropia, bilateral: Secondary | ICD-10-CM | POA: Diagnosis not present

## 2020-07-03 DIAGNOSIS — H52223 Regular astigmatism, bilateral: Secondary | ICD-10-CM | POA: Diagnosis not present

## 2020-07-03 DIAGNOSIS — Z7984 Long term (current) use of oral hypoglycemic drugs: Secondary | ICD-10-CM | POA: Diagnosis not present

## 2020-07-03 DIAGNOSIS — E119 Type 2 diabetes mellitus without complications: Secondary | ICD-10-CM | POA: Diagnosis not present

## 2020-07-03 DIAGNOSIS — Z0100 Encounter for examination of eyes and vision without abnormal findings: Secondary | ICD-10-CM | POA: Diagnosis not present

## 2020-07-15 DIAGNOSIS — Z1329 Encounter for screening for other suspected endocrine disorder: Secondary | ICD-10-CM | POA: Diagnosis not present

## 2020-07-15 DIAGNOSIS — E039 Hypothyroidism, unspecified: Secondary | ICD-10-CM | POA: Diagnosis not present

## 2020-07-30 DIAGNOSIS — Z1231 Encounter for screening mammogram for malignant neoplasm of breast: Secondary | ICD-10-CM | POA: Diagnosis not present

## 2020-08-01 DIAGNOSIS — R69 Illness, unspecified: Secondary | ICD-10-CM | POA: Diagnosis not present

## 2020-08-23 DIAGNOSIS — Z20828 Contact with and (suspected) exposure to other viral communicable diseases: Secondary | ICD-10-CM | POA: Diagnosis not present

## 2020-09-03 DIAGNOSIS — E039 Hypothyroidism, unspecified: Secondary | ICD-10-CM | POA: Diagnosis not present

## 2020-09-03 DIAGNOSIS — Z6837 Body mass index (BMI) 37.0-37.9, adult: Secondary | ICD-10-CM | POA: Diagnosis not present

## 2020-09-03 DIAGNOSIS — J309 Allergic rhinitis, unspecified: Secondary | ICD-10-CM | POA: Diagnosis not present

## 2020-09-03 DIAGNOSIS — E1165 Type 2 diabetes mellitus with hyperglycemia: Secondary | ICD-10-CM | POA: Diagnosis not present

## 2020-09-03 DIAGNOSIS — E785 Hyperlipidemia, unspecified: Secondary | ICD-10-CM | POA: Diagnosis not present

## 2020-09-03 DIAGNOSIS — K219 Gastro-esophageal reflux disease without esophagitis: Secondary | ICD-10-CM | POA: Diagnosis not present

## 2020-09-03 DIAGNOSIS — R69 Illness, unspecified: Secondary | ICD-10-CM | POA: Diagnosis not present

## 2020-11-28 DIAGNOSIS — Z6836 Body mass index (BMI) 36.0-36.9, adult: Secondary | ICD-10-CM | POA: Diagnosis not present

## 2020-11-28 DIAGNOSIS — E785 Hyperlipidemia, unspecified: Secondary | ICD-10-CM | POA: Diagnosis not present

## 2020-11-28 DIAGNOSIS — R69 Illness, unspecified: Secondary | ICD-10-CM | POA: Diagnosis not present

## 2020-11-28 DIAGNOSIS — E1165 Type 2 diabetes mellitus with hyperglycemia: Secondary | ICD-10-CM | POA: Diagnosis not present

## 2020-11-28 DIAGNOSIS — J309 Allergic rhinitis, unspecified: Secondary | ICD-10-CM | POA: Diagnosis not present

## 2020-11-28 DIAGNOSIS — E039 Hypothyroidism, unspecified: Secondary | ICD-10-CM | POA: Diagnosis not present

## 2020-11-28 DIAGNOSIS — K219 Gastro-esophageal reflux disease without esophagitis: Secondary | ICD-10-CM | POA: Diagnosis not present

## 2020-11-28 DIAGNOSIS — Z1331 Encounter for screening for depression: Secondary | ICD-10-CM | POA: Diagnosis not present

## 2020-11-28 DIAGNOSIS — Z1389 Encounter for screening for other disorder: Secondary | ICD-10-CM | POA: Diagnosis not present

## 2020-12-20 DIAGNOSIS — G4701 Insomnia due to medical condition: Secondary | ICD-10-CM | POA: Diagnosis not present

## 2020-12-20 DIAGNOSIS — G4733 Obstructive sleep apnea (adult) (pediatric): Secondary | ICD-10-CM | POA: Diagnosis not present

## 2021-01-02 DIAGNOSIS — M9903 Segmental and somatic dysfunction of lumbar region: Secondary | ICD-10-CM | POA: Diagnosis not present

## 2021-01-02 DIAGNOSIS — S338XXA Sprain of other parts of lumbar spine and pelvis, initial encounter: Secondary | ICD-10-CM | POA: Diagnosis not present

## 2021-01-02 DIAGNOSIS — S134XXA Sprain of ligaments of cervical spine, initial encounter: Secondary | ICD-10-CM | POA: Diagnosis not present

## 2021-01-02 DIAGNOSIS — S233XXA Sprain of ligaments of thoracic spine, initial encounter: Secondary | ICD-10-CM | POA: Diagnosis not present

## 2021-01-02 DIAGNOSIS — M9902 Segmental and somatic dysfunction of thoracic region: Secondary | ICD-10-CM | POA: Diagnosis not present

## 2021-01-02 DIAGNOSIS — M9901 Segmental and somatic dysfunction of cervical region: Secondary | ICD-10-CM | POA: Diagnosis not present

## 2021-01-08 DIAGNOSIS — M9901 Segmental and somatic dysfunction of cervical region: Secondary | ICD-10-CM | POA: Diagnosis not present

## 2021-01-08 DIAGNOSIS — M9902 Segmental and somatic dysfunction of thoracic region: Secondary | ICD-10-CM | POA: Diagnosis not present

## 2021-01-08 DIAGNOSIS — S134XXA Sprain of ligaments of cervical spine, initial encounter: Secondary | ICD-10-CM | POA: Diagnosis not present

## 2021-01-08 DIAGNOSIS — M9903 Segmental and somatic dysfunction of lumbar region: Secondary | ICD-10-CM | POA: Diagnosis not present

## 2021-01-08 DIAGNOSIS — S233XXA Sprain of ligaments of thoracic spine, initial encounter: Secondary | ICD-10-CM | POA: Diagnosis not present

## 2021-01-08 DIAGNOSIS — S338XXA Sprain of other parts of lumbar spine and pelvis, initial encounter: Secondary | ICD-10-CM | POA: Diagnosis not present

## 2021-01-16 DIAGNOSIS — S338XXA Sprain of other parts of lumbar spine and pelvis, initial encounter: Secondary | ICD-10-CM | POA: Diagnosis not present

## 2021-01-16 DIAGNOSIS — M9902 Segmental and somatic dysfunction of thoracic region: Secondary | ICD-10-CM | POA: Diagnosis not present

## 2021-01-16 DIAGNOSIS — S233XXA Sprain of ligaments of thoracic spine, initial encounter: Secondary | ICD-10-CM | POA: Diagnosis not present

## 2021-01-16 DIAGNOSIS — S134XXA Sprain of ligaments of cervical spine, initial encounter: Secondary | ICD-10-CM | POA: Diagnosis not present

## 2021-01-16 DIAGNOSIS — M9901 Segmental and somatic dysfunction of cervical region: Secondary | ICD-10-CM | POA: Diagnosis not present

## 2021-01-16 DIAGNOSIS — M9903 Segmental and somatic dysfunction of lumbar region: Secondary | ICD-10-CM | POA: Diagnosis not present

## 2021-01-20 DIAGNOSIS — R6884 Jaw pain: Secondary | ICD-10-CM | POA: Diagnosis not present

## 2021-01-20 DIAGNOSIS — R0600 Dyspnea, unspecified: Secondary | ICD-10-CM | POA: Diagnosis not present

## 2021-01-20 DIAGNOSIS — Z6837 Body mass index (BMI) 37.0-37.9, adult: Secondary | ICD-10-CM | POA: Diagnosis not present

## 2021-02-04 DIAGNOSIS — M9901 Segmental and somatic dysfunction of cervical region: Secondary | ICD-10-CM | POA: Diagnosis not present

## 2021-02-04 DIAGNOSIS — S233XXA Sprain of ligaments of thoracic spine, initial encounter: Secondary | ICD-10-CM | POA: Diagnosis not present

## 2021-02-04 DIAGNOSIS — M9903 Segmental and somatic dysfunction of lumbar region: Secondary | ICD-10-CM | POA: Diagnosis not present

## 2021-02-04 DIAGNOSIS — M9902 Segmental and somatic dysfunction of thoracic region: Secondary | ICD-10-CM | POA: Diagnosis not present

## 2021-02-04 DIAGNOSIS — S134XXA Sprain of ligaments of cervical spine, initial encounter: Secondary | ICD-10-CM | POA: Diagnosis not present

## 2021-02-04 DIAGNOSIS — S338XXA Sprain of other parts of lumbar spine and pelvis, initial encounter: Secondary | ICD-10-CM | POA: Diagnosis not present

## 2021-02-13 DIAGNOSIS — K219 Gastro-esophageal reflux disease without esophagitis: Secondary | ICD-10-CM | POA: Diagnosis not present

## 2021-02-13 DIAGNOSIS — E039 Hypothyroidism, unspecified: Secondary | ICD-10-CM | POA: Diagnosis not present

## 2021-02-13 DIAGNOSIS — E785 Hyperlipidemia, unspecified: Secondary | ICD-10-CM | POA: Diagnosis not present

## 2021-02-13 DIAGNOSIS — E1165 Type 2 diabetes mellitus with hyperglycemia: Secondary | ICD-10-CM | POA: Diagnosis not present

## 2021-02-19 DIAGNOSIS — R6884 Jaw pain: Secondary | ICD-10-CM | POA: Diagnosis not present

## 2021-02-19 DIAGNOSIS — R0600 Dyspnea, unspecified: Secondary | ICD-10-CM | POA: Diagnosis not present

## 2021-02-19 DIAGNOSIS — I351 Nonrheumatic aortic (valve) insufficiency: Secondary | ICD-10-CM | POA: Diagnosis not present

## 2021-02-20 DIAGNOSIS — S134XXA Sprain of ligaments of cervical spine, initial encounter: Secondary | ICD-10-CM | POA: Diagnosis not present

## 2021-02-20 DIAGNOSIS — S233XXA Sprain of ligaments of thoracic spine, initial encounter: Secondary | ICD-10-CM | POA: Diagnosis not present

## 2021-02-20 DIAGNOSIS — M9901 Segmental and somatic dysfunction of cervical region: Secondary | ICD-10-CM | POA: Diagnosis not present

## 2021-02-20 DIAGNOSIS — S338XXA Sprain of other parts of lumbar spine and pelvis, initial encounter: Secondary | ICD-10-CM | POA: Diagnosis not present

## 2021-02-20 DIAGNOSIS — M9903 Segmental and somatic dysfunction of lumbar region: Secondary | ICD-10-CM | POA: Diagnosis not present

## 2021-02-20 DIAGNOSIS — M9902 Segmental and somatic dysfunction of thoracic region: Secondary | ICD-10-CM | POA: Diagnosis not present

## 2021-02-26 DIAGNOSIS — H04413 Chronic dacryocystitis of bilateral lacrimal passages: Secondary | ICD-10-CM | POA: Diagnosis not present

## 2021-03-03 DIAGNOSIS — R69 Illness, unspecified: Secondary | ICD-10-CM | POA: Diagnosis not present

## 2021-03-03 DIAGNOSIS — E1165 Type 2 diabetes mellitus with hyperglycemia: Secondary | ICD-10-CM | POA: Diagnosis not present

## 2021-03-03 DIAGNOSIS — K219 Gastro-esophageal reflux disease without esophagitis: Secondary | ICD-10-CM | POA: Diagnosis not present

## 2021-03-03 DIAGNOSIS — G4733 Obstructive sleep apnea (adult) (pediatric): Secondary | ICD-10-CM | POA: Diagnosis not present

## 2021-03-03 DIAGNOSIS — E785 Hyperlipidemia, unspecified: Secondary | ICD-10-CM | POA: Diagnosis not present

## 2021-03-03 DIAGNOSIS — E039 Hypothyroidism, unspecified: Secondary | ICD-10-CM | POA: Diagnosis not present

## 2021-03-03 DIAGNOSIS — J309 Allergic rhinitis, unspecified: Secondary | ICD-10-CM | POA: Diagnosis not present

## 2021-03-03 DIAGNOSIS — Z6837 Body mass index (BMI) 37.0-37.9, adult: Secondary | ICD-10-CM | POA: Diagnosis not present

## 2021-03-06 DIAGNOSIS — R079 Chest pain, unspecified: Secondary | ICD-10-CM | POA: Diagnosis not present

## 2021-03-06 DIAGNOSIS — Z79899 Other long term (current) drug therapy: Secondary | ICD-10-CM | POA: Diagnosis not present

## 2021-03-06 DIAGNOSIS — I700 Atherosclerosis of aorta: Secondary | ICD-10-CM | POA: Diagnosis not present

## 2021-03-06 DIAGNOSIS — K760 Fatty (change of) liver, not elsewhere classified: Secondary | ICD-10-CM | POA: Diagnosis not present

## 2021-03-06 DIAGNOSIS — R9431 Abnormal electrocardiogram [ECG] [EKG]: Secondary | ICD-10-CM | POA: Diagnosis not present

## 2021-03-06 DIAGNOSIS — R0600 Dyspnea, unspecified: Secondary | ICD-10-CM | POA: Diagnosis not present

## 2021-03-06 DIAGNOSIS — Z885 Allergy status to narcotic agent status: Secondary | ICD-10-CM | POA: Diagnosis not present

## 2021-03-06 DIAGNOSIS — J439 Emphysema, unspecified: Secondary | ICD-10-CM | POA: Diagnosis not present

## 2021-03-06 DIAGNOSIS — R0609 Other forms of dyspnea: Secondary | ICD-10-CM | POA: Diagnosis not present

## 2021-03-06 DIAGNOSIS — R0602 Shortness of breath: Secondary | ICD-10-CM | POA: Diagnosis not present

## 2021-03-07 DIAGNOSIS — R0600 Dyspnea, unspecified: Secondary | ICD-10-CM | POA: Diagnosis not present

## 2021-03-07 DIAGNOSIS — R0609 Other forms of dyspnea: Secondary | ICD-10-CM | POA: Diagnosis not present

## 2021-03-10 DIAGNOSIS — G4733 Obstructive sleep apnea (adult) (pediatric): Secondary | ICD-10-CM | POA: Diagnosis not present

## 2021-03-11 DIAGNOSIS — S233XXA Sprain of ligaments of thoracic spine, initial encounter: Secondary | ICD-10-CM | POA: Diagnosis not present

## 2021-03-11 DIAGNOSIS — M9902 Segmental and somatic dysfunction of thoracic region: Secondary | ICD-10-CM | POA: Diagnosis not present

## 2021-03-11 DIAGNOSIS — M9901 Segmental and somatic dysfunction of cervical region: Secondary | ICD-10-CM | POA: Diagnosis not present

## 2021-03-11 DIAGNOSIS — S338XXA Sprain of other parts of lumbar spine and pelvis, initial encounter: Secondary | ICD-10-CM | POA: Diagnosis not present

## 2021-03-11 DIAGNOSIS — S134XXA Sprain of ligaments of cervical spine, initial encounter: Secondary | ICD-10-CM | POA: Diagnosis not present

## 2021-03-11 DIAGNOSIS — M9903 Segmental and somatic dysfunction of lumbar region: Secondary | ICD-10-CM | POA: Diagnosis not present

## 2021-03-17 NOTE — Progress Notes (Signed)
Cardiology Office Note  Date: 03/18/2021   ID: Rachel Odonnell, DOB 1956/07/15, MRN [id number]  PCP:  Royann ShiversSkillman, Katherine E, PA-C  Cardiologist:  None Electrophysiologist:  None   Chief Complaint: Chest pain  History of Present Illness: Rachel Odonnell is a 65 y.o. female with a history of DM2, HTN  She was last seen by Dr. Purvis SheffieldKoneswaran 03/21/2018 for follow-up for dyspnea on exertion, chest pain unspecified, fatigue, DM2.  She had a nuclear stress test on 03/11/2018 which was normal with a EF of 55 to 60%.  He noted since her last visit she had 1 mild episode of retrosternal chest tightness radiating to left arm.  Her exertional dyspnea had improved.  She had a sleep study several years prior and was told she did not have sleep apnea.  She was told she had narcolepsy and was taking Adderall.  Her symptoms have improved since her prior visit.  She was told to contact Dr. Purvis SheffieldKoneswaran should she have recurrent and progressive symptoms.  Her diabetes was poorly controlled with her most recent A1c of 8.9%.  She was taking Janumet and Trulicity.  [facility] [place] on 03/06/2021 with complaints of shortness of breath and chest pain for several months which she described as continuing to become worse.  She stated she had a stress test a week prior and was told it was normal.  She was scheduled to see cardiology in August but stated she became short of breath a little activity and chest pain was also becoming worse.  Initial troponin was 4 with a delta of 1.  CBC was normal.  Glucose is 198, AST was 66.  She had a CT of the chest with results noted below.  There was some evidence of coronary artery calcification and emphysema.  Also evidence of hepatic steatosis.   She is here today for follow-up status post recent [facility] stress [facility].  Stress echo did not reveal any wall motion abnormality consistent with significant ischemia.  She did have a symptom of jaw pain which was reproduced  at peak stress.  She denied any chest discomfort.  She had normal LV systolic function with no regional wall motion abnormalities.  No regional wall motion average noted poststress.  LV was normal in size and wall thickness.  LV systolic function with EF of 60 to 65%.  RV was normal in size and normal function.  Left atrium was mildly dilated in size, right atrium normal in size, aortic valve trileaflet with mildly thickened leaflets with normal excursion.  Mild aortic regurgitation.  There was no evidence of significant transvalvular gradient.  Aortic valve sclerosis without stenosis.  Pulmonary valve was not well visualized.  Mitral valve showed no significant mitral valve regurgitation and no mitral stenosis.  There was no significant tricuspid regurgitation.  She had right neck pain at peak exercise which was the indication for the stress echo.  She states in spite of the normal results she starts having issues with right neck pain and dizziness with shortness of breath when performing more than normal ADLs.  She [place] and started having neck pain, shortness of breath and dizziness.  She states the symptoms started about a year ago but they seem to have progressed over time.  She states she had a CT [facility] which showed mild coronary artery calcification, mild paraseptal emphysema, mild  hepatic steatosis, aortic atherosclerosis and emphysema.  She states she was a prior smoker.  She smoked approximately 25 years and quit approximately 25 years ago.  However she has been taking care of her for her father over the years and has been exposed to heavy secondhand smoke from him before he passed away.  She does have a history of GERD and takes a PPI.    Past Medical History:  Diagnosis Date   ADD (attention deficit disorder)    Diabetes mellitus without complication (HCC)    Hypertension     Past Surgical History:  Procedure  Laterality Date   INCONTINENCE SURGERY      Current Outpatient Medications  Medication Sig Dispense Refill   aspirin EC 81 MG tablet Take 81 mg by mouth daily.     citalopram (CELEXA) 40 MG tablet Take 40 mg by mouth daily.     diclofenac (VOLTAREN) 75 MG EC tablet Take 1 tablet by mouth 2 (two) times daily.     estradiol (ESTRACE) 2 MG tablet Take 2 mg by mouth daily.     EUTHYROX 25 MCG tablet Take 1 tablet by mouth daily.     fenofibrate (TRICOR) 145 MG tablet Take 145 mg by mouth daily.     glipiZIDE (GLUCOTROL) 5 MG tablet Take 3 tablets by mouth daily. Take 5 mg in the morning and 10 mg in the evening     isosorbide mononitrate (IMDUR) 30 MG 24 hr tablet Take 1 tablet (30 mg total) by mouth daily. 90 tablet 1   levocetirizine (XYZAL) 5 MG tablet Take 5 mg by mouth every evening.     montelukast (SINGULAIR) 10 MG tablet Take 10 mg by mouth at bedtime.     omeprazole (PRILOSEC) 40 MG capsule Take 40 mg by mouth daily.     pioglitazone (ACTOS) 15 MG tablet Take 1 tablet by mouth daily.     rOPINIRole (REQUIP) 2 MG tablet Take 2 mg by mouth 2 (two) times daily.     rosuvastatin (CRESTOR) 10 MG tablet Take 1 tablet by mouth daily.     TRULICITY 0.75 MG/0.5ML SOPN Inject 0.75 mg into the skin once a week.     No current facility-administered medications for this visit.   Allergies:  Azithromycin, Codeine, Atomoxetine, Atorvastatin, and Lisinopril   Social History: The patient  reports that she has never smoked. She has never used smokeless tobacco. She reports that she does not drink alcohol and does not use drugs.   Family History: The patient's family history includes COPD in her father; Congestive Heart Failure in her father; Emphysema in her father; Heart Problems in her father; Heart block in her father.   ROS:  Please see the history of present illness. Otherwise, complete review of systems is positive for none.  All other systems are reviewed and negative.   Physical Exam: VS:   BP 130/60   Pulse 60   Ht  (1.626 m)   Wt 217 lb 9.6 oz (98.7 kg)   SpO2 98%   BMI 37.35 kg/m , BMI Body mass index is 37.35 kg/m.  Wt Readings from Last 3 Encounters:  03/18/21 217 lb 9.6 oz (98.7 kg)  03/21/18 209 lb (94.8 kg)  03/03/18 216 lb (98 kg)    General: Patient appears comfortable at rest. Neck: Supple, no elevated JVP or carotid bruits, no thyromegaly. Lungs: Clear to auscultation, nonlabored breathing at rest. Cardiac: Regular rate and rhythm, no S3 or significant systolic murmur,  no pericardial rub. Extremities: No pitting edema, distal pulses 2+. Skin: Warm and dry. Musculoskeletal: No kyphosis. Neuropsychiatric: Alert and oriented x3, affect grossly appropriate.  ECG:    Recent Labwork: No results found for requested labs within last 8760 hours.  No results found for: CHOL, TRIG, HDL, CHOLHDL, VLDL, LDLCALC, LDLDIRECT  Other Studies Reviewed Today:    Dobutamine stress test 02/19/2021 [facility]  Stress Dobutamine (02/19/2021 10:30 AM EDT) Specimen (Source) Anatomical Location / Laterality Collection Method / Volume Collection Time Received Time        02/19/2021 9:48 AM EDT     Imaging Results - Echocardiogram Stress Dobutamine (02/19/2021 10:30 AM EDT) Narrative  02/19/2021 11:32 AM EDT   Patient Info Name:     Lendon Collar Kato Age:     64 years DOB:     04/13/1956 Gender:     Female MRN:     [id number] Accession #:     [id number] Ht:     163 cm Wt:     99 kg BSA:     2.16 m2 BP:     148 /     56 mmHg Technical Quality:     Fair Exam Date:     02/19/2021 9:48 AM Site Location:     [place] Exam Location:     [place] Admit Date:     02/19/2021  Exam Type:     ECHOCARDIOGRAM STRESS DOBUTAMINE  Study Info Indications     - dyspnea, jaw pain  Dobutamine stress echocardiogram is performed.  Staff Referring Physician:     Royann Shivers Ordering Physician:     Royann Shivers Sonographer:     Sinda Du  Account #:     [id number]   Summary  1. Pharmacologic stress echocardiogram does not reveal wall motion abnormality consistent with significant ischemia.  2. Patient symptom of jaw pain was reproduced at peak stress (reason for stress test).  No chest discomfort reported.   Stress Echo Findings Left Ventricle  Normal left ventricular systolic function with no regional wall motion abnormalities noted at rest.  No regional wall motion abnormalities noted post stress.   Left Ventricle  The left ventricle is normal in size with normal wall thickness.  The left ventricular systolic function is normal, LVEF is visually estimated at 60-65%.  Right Ventricle  The right ventricle is normal in size, with normal systolic function.   Left Atrium  The left atrium is mildly dilated in size.  Right Atrium  The right atrium is normal  in size.   Aortic Valve  The aortic valve is trileaflet with mildly thickened leaflets with normal excursion.  There is mild aortic regurgitation.  There is no evidence of a significant transvalvular gradient.  Aortic valve sclerosis without stenosis.  Pulmonary Valve  Pulmonary valve is not well visualized.  Mitral Valve  There is no significant mitral valve regurgitation.  There is no mitral stenosis.  Tricuspid Valve  There is no significant tricuspid regurgitation.   Other Findings  Rhythm: Sinus Rhythm.  Pericardium  There is no pericardial effusion.  Aorta  The aorta is normal in size in the visualized segments.   Protocol:     Dobutamine Rest HR:     61 bpm Peak HR:     144 bpm Rest Sys BP:     148 mmHg Rest Dias BP:     56 mmHg Peak Sys BP:     227 mmHg Peak Dias BP:  68 mmHg Max Pred HR:     155 bpm % Max Pred HR:     93 % Target HR:     132 bpm Max RPP:     32,688 bpm*mmHg Target HR Summary:     Patient's target heart rate was not achieved due to fatigue BP Response:     Normal blood pressure  response Termination Reason:     Reached target heart rate or workload Total Time:     10 min : 25 sec Resting ECG  Normal sinus rhythm. Stress ECG  Borderline ST depression - inferior leads. Arrhythmias  Frequent PACs.   Left Ventricular Outflow Tract ---------------------------------------------------------------------- Name                                 Value        Normal ----------------------------------------------------------------------  LVOT 2D ---------------------------------------------------------------------- LVOT Diameter                       1.8 cm                LVOT Area                          2.5 cm2  Aorta ---------------------------------------------------------------------- Name                                 Value        Normal ----------------------------------------------------------------------  Ascending Aorta ---------------------------------------------------------------------- Asc Ao Diameter                     3.6 cm  Ventricles ---------------------------------------------------------------------- Name                                 Value        Normal ----------------------------------------------------------------------  LV Dimensions 2D/MM ---------------------------------------------------------------------- LVOT Diameter                       1.8 cm   Report Signatures Echo Finalized by [employer]  Assar  DO on 02/19/2021 11:32 AM Stress ECG Finalize by Rayetta Pigg  Assar  DO on 02/19/2021 11:32 AM     Imaging Results - Echocardiogram Stress Dobutamine (02/19/2021 10:30 AM EDT) Procedure Note  Assar, Soheil, DO - 02/19/2021   Formatting of this note might be different from the original. Patient Info Name: ALYCEA SEGOVIANO Marcella Age: 86 years DOB: October 02, 1955 Gender: Female MRN: [id number] Accession #: [id number] Ht: 163 cm Wt: 99 kg BSA: 2.16 m2 BP: 148 / 56 mmHg Technical Quality: Fair Exam Date: 02/19/2021 9:48 AM Site  Location: [place] Exam Location: [place] Admit Date: 02/19/2021  Exam Type: ECHOCARDIOGRAM STRESS DOBUTAMINE  Study Info Indications - dyspnea, jaw pain  Dobutamine stress echocardiogram is performed.  Staff Referring Physician: Royann Shivers Ordering Physician: Royann Shivers Sonographer: Sinda Du  Account #: [id number]   Summary 1. Pharmacologic stress echocardiogram does not reveal wall motion abnormality consistent with significant ischemia. 2. Patient symptom of jaw pain was reproduced at peak stress (reason for stress test). No chest discomfort reported.     03/07/2021  CTA  Chest W Contrast  Anatomical Region Laterality Modality  Chest -- Computed Tomography  Vascular -- --  Impression  Slightly limited examination without evidence of acute pulmonary  embolism through the proximal segmental arteries. Distal segmental  and subsegmental pulmonary arteries are not well assessed on this  examination.   Mild coronary artery calcification.   Mild paraseptal emphysema.   Mild hepatic steatosis   Aortic Atherosclerosis (ICD10-I70.0) and Emphysema (ICD10-J43.9).       Stress Myoview 03/11/2018 Study Result  Narrative & Impression  There was no ST segment deviation noted during stress. The study is normal. There are no perfusion defects This is a low risk study. The left ventricular ejection fraction is normal (55-65%).      Assessment and Plan:  1. DOE (dyspnea on exertion)   2. Chest pain of uncertain etiology   3. Type 2 diabetes mellitus without complication, without long-term current use of insulin (HCC)   4. History of tobacco abuse    1. DOE (dyspnea on exertion) Complaining of DOE on mild to moderate exertion.  History of smoking x25 years and significant exposure to secondhand smoke from her father who was a heavy smoker until his death recently.  Recent CT scan showed evidence of mild paraseptal emphysema,  coronary artery calcifications, and hepatic steatosis.  Please refer to pulmonology either Dr. Vassie Loll or Dr. Sherene Sires in [place] for evaluation.  2. Chest pain of uncertain etiology Patient states she still has neck pain not necessarily chest pain when she performs normal more than normal activity.  She notes associated shortness of breath when this occurs.  Please start Imdur 30 mg p.o. daily.  Recently had a negative dobutamine stress.  Please see results above  3. Type 2 diabetes mellitus without complication, without long-term current use of insulin [facility]) Patient states she has been having some issues with controlling her diabetes.  She is taking metformin which causes her a significant amount of diarrhea.  Recent random glucose in the emergency room 11 days ago 198.  4. History of tobacco abuse History of tobacco abuse.  She used to smoke approximately 25 years and has quit approximately 25 years but states she has been exposed to significant secondhand smoke from taking care of her father who was a heavy smoker for a long time until his death last year.  5.  Palpitations/racing heart She notes increasing heart rate/palpitations when performing more than usual activity associated with some dizziness.  Please place a 14-day ZIO monitor to assess for arrhythmias.  Medication Adjustments/Labs and Tests Ordered: Current medicines are reviewed at length with the patient today.  Concerns regarding medicines are outlined above.   Disposition: Follow-up with Dr. Wyline Mood or APP 2 to 3 months  Signed, Rennis Harding, NP 03/18/2021 11:47 AM    [facility] at [facility] [address] [place], [place], [place] [address] Phone: [phone]; Fax: [phone]

## 2021-03-18 ENCOUNTER — Ambulatory Visit (INDEPENDENT_AMBULATORY_CARE_PROVIDER_SITE_OTHER): Payer: Medicare HMO

## 2021-03-18 ENCOUNTER — Telehealth: Payer: Self-pay | Admitting: Family Medicine

## 2021-03-18 ENCOUNTER — Encounter: Payer: Self-pay | Admitting: Family Medicine

## 2021-03-18 ENCOUNTER — Other Ambulatory Visit: Payer: Self-pay | Admitting: Family Medicine

## 2021-03-18 ENCOUNTER — Ambulatory Visit (INDEPENDENT_AMBULATORY_CARE_PROVIDER_SITE_OTHER): Payer: Medicare HMO | Admitting: Family Medicine

## 2021-03-18 VITALS — BP 130/60 | HR 60 | Ht 64.0 in | Wt 217.6 lb

## 2021-03-18 DIAGNOSIS — R079 Chest pain, unspecified: Secondary | ICD-10-CM

## 2021-03-18 DIAGNOSIS — Z87891 Personal history of nicotine dependence: Secondary | ICD-10-CM | POA: Diagnosis not present

## 2021-03-18 DIAGNOSIS — R002 Palpitations: Secondary | ICD-10-CM

## 2021-03-18 DIAGNOSIS — E119 Type 2 diabetes mellitus without complications: Secondary | ICD-10-CM

## 2021-03-18 DIAGNOSIS — R0600 Dyspnea, unspecified: Secondary | ICD-10-CM | POA: Diagnosis not present

## 2021-03-18 DIAGNOSIS — R0609 Other forms of dyspnea: Secondary | ICD-10-CM

## 2021-03-18 MED ORDER — ISOSORBIDE MONONITRATE ER 30 MG PO TB24: 30.0000 mg | ORAL_TABLET | Freq: Every day | ORAL | 1 refills | Status: DC

## 2021-03-18 NOTE — Patient Instructions (Addendum)
Medication Instructions:  Your physician has recommended you make the following change in your medication:  Start isosorbide mononitrate 30 mg by mouth daily Continue other medications the same  Labwork: none  Testing/Procedures: ZIO- Long Term Monitor Instructions   Your physician has requested you wear your ZIO patch monitor 14 days.   This is a single patch monitor.  [employer] supplies one patch monitor per enrollment.  Additional stickers are not available.   Please do not apply patch if you will be having a Nuclear Stress Test, Echocardiogram, Cardiac CT, MRI, or Chest Xray during the time frame you would be wearing the monitor. The patch cannot be worn during these tests.  You cannot remove and re-apply the ZIO XT patch monitor.     Once you have received you monitor, please review enclosed instructions.  Your monitor has already been registered assigning a specific monitor serial # to you.   Applying the monitor   Shave hair from upper left chest.   Hold abrader disc by orange tab.  Rub abrader in 40 strokes over left upper chest as indicated in your monitor instructions.   Clean area with 4 enclosed alcohol pads .  Use all pads to assure are is cleaned thoroughly.  Let dry.   Apply patch as indicated in monitor instructions.  Patch will be place under collarbone on left side of chest with arrow pointing upward.   Rub patch adhesive wings for 2 minutes.Remove Borum label marked "1".  Remove Lozito label marked "2".  Rub patch adhesive wings for 2 additional minutes.   While looking in a mirror, press and release button in center of patch.  A small green light will flash 3-4 times .  This will be your only indicator the monitor has been turned on.     Do not shower for the first 24 hours.  You may shower after the first 24 hours.   Press button if you feel a symptom. You will hear a small click.  Record Date, Time and Symptom in the Patient Log Book.   When you are ready to  remove patch, follow instructions on last 2 pages of Patient Log Book.  Stick patch monitor onto last page of Patient Log Book.   Place Patient Log Book in [place] box.  Use locking tab on box and tape box closed securely.  The [employer] and [employer] has [employer] on it.  Please place in mailbox as soon as possible.  Your physician should have your test results approximately 7 days after the monitor has been mailed back to [facility].   Call [facility] Customer Care at [phone] if you have questions regarding your ZIO XT patch monitor.  Call them immediately if you see an orange light blinking on your monitor.   If your monitor falls off in less than 4 days contact our Monitor department at [phone].  If your monitor becomes loose or falls off after 4 days call [employer] at [phone] for suggestions on securing your monitor.  Follow-Up: Your physician recommends that you schedule a follow-up appointment in: 2 months  Any Other Special Instructions Will Be Listed Below (If Applicable). You have been referred to [facility] Pulmonology  If you need a refill on your cardiac medications before your next appointment, please call your pharmacy.

## 2021-03-18 NOTE — Telephone Encounter (Signed)
Pre-cert Verification for the following    14 Day ZIO XT dx: palpitations

## 2021-03-19 DIAGNOSIS — H04203 Unspecified epiphora, bilateral lacrimal glands: Secondary | ICD-10-CM | POA: Diagnosis not present

## 2021-03-24 DIAGNOSIS — S338XXA Sprain of other parts of lumbar spine and pelvis, initial encounter: Secondary | ICD-10-CM | POA: Diagnosis not present

## 2021-03-24 DIAGNOSIS — M9901 Segmental and somatic dysfunction of cervical region: Secondary | ICD-10-CM | POA: Diagnosis not present

## 2021-03-24 DIAGNOSIS — S134XXA Sprain of ligaments of cervical spine, initial encounter: Secondary | ICD-10-CM | POA: Diagnosis not present

## 2021-03-24 DIAGNOSIS — M9903 Segmental and somatic dysfunction of lumbar region: Secondary | ICD-10-CM | POA: Diagnosis not present

## 2021-03-24 DIAGNOSIS — M9902 Segmental and somatic dysfunction of thoracic region: Secondary | ICD-10-CM | POA: Diagnosis not present

## 2021-03-24 DIAGNOSIS — S233XXA Sprain of ligaments of thoracic spine, initial encounter: Secondary | ICD-10-CM | POA: Diagnosis not present

## 2021-03-26 DIAGNOSIS — H1033 Unspecified acute conjunctivitis, bilateral: Secondary | ICD-10-CM | POA: Diagnosis not present

## 2021-03-28 ENCOUNTER — Encounter: Payer: Self-pay | Admitting: *Deleted

## 2021-03-28 ENCOUNTER — Other Ambulatory Visit: Payer: Self-pay | Admitting: *Deleted

## 2021-03-28 NOTE — Patient Outreach (Signed)
[facility]) Care Management  03/28/2021  Rachel Odonnell 06/04/1956 [id number]   [facility] outreach to MD referred patient   Mrs CHIRSTY ARMISTEAD was referred by Wayland Denis, PA-C for uncontrolled diabetes   [employer] and blue cross and blue shield state health plan   Initial assessment  Mrs Amero reports she and family members are traveling to [place] at the time of this outreach for a Cruise until after 04/07/21  Diabetes She confirms concerns with uncontrolled diabetes and reports she has been seen by an endocrinologist once but did not feel it was beneficial. She generally monitors her cbg in the mornings. She reports she is able to calculate her HgA1c on her phone. She voices concerns with managing her diabetes with her diet. She reports attempts to reduce her intake of carbohydrates (potatoes noodles, breads.) She reports her last HgA1c was 11.1 but has previously been able to keep it between 6-7 She discussed changes in her medicines from Trulicity & Farxiga to Metformin after having cost concerns. Patient assistance applications have not been completed. She reports concerns with coping related to poor results with diet and weight management. She weighs weekly and reports a weight of 220 lbs this morning She had been 215 lbs. She reports minimal exercise  Metabolic resistance syndrome discussed  Hypertension Her .blood pressure (BP) this morning was 130/62 Se reports experiencing menopause early (age 89) she had a hysterectomy at age 42. 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 Noelle Penner, RN, BSN, CCM [facility] Telephonic Care Management Care Coordinator Office number [phone] Main [facility] number [phone] Fax number [phone]

## 2021-04-07 DIAGNOSIS — R002 Palpitations: Secondary | ICD-10-CM | POA: Diagnosis not present

## 2021-04-09 ENCOUNTER — Other Ambulatory Visit: Payer: Self-pay | Admitting: *Deleted

## 2021-04-09 NOTE — Patient Outreach (Signed)
[facility]) Care Management  04/09/2021  Rachel Odonnell 04-18-1956 [id number]   [facility] unsuccessful follow up outreach to MD referred patient    Rachel Odonnell was referred by Wayland Denis, PA-C for uncontrolled diabetes     [employer] and blue cross and blue shield state health plan      Outreach attempt to the home number [phone] No answer. [facility] RN CM left HIPAA [facility] Portability and Accountability Act) compliant voicemail message along with CM's contact info.   Plan: [facility] RN CM scheduled this patient for another call attempt within 4-7 business days Unsuccessful outreach on 04/09/21   Rachel Odonnell L. Noelle Penner, RN, BSN, CCM [facility] Telephonic Care Management Care Coordinator Office number [phone] Mobile number [phone]  Main [facility] number [phone] Fax number [phone]

## 2021-04-10 DIAGNOSIS — G4733 Obstructive sleep apnea (adult) (pediatric): Secondary | ICD-10-CM | POA: Diagnosis not present

## 2021-04-17 DIAGNOSIS — M9903 Segmental and somatic dysfunction of lumbar region: Secondary | ICD-10-CM | POA: Diagnosis not present

## 2021-04-17 DIAGNOSIS — M9901 Segmental and somatic dysfunction of cervical region: Secondary | ICD-10-CM | POA: Diagnosis not present

## 2021-04-17 DIAGNOSIS — M9902 Segmental and somatic dysfunction of thoracic region: Secondary | ICD-10-CM | POA: Diagnosis not present

## 2021-04-17 DIAGNOSIS — S134XXA Sprain of ligaments of cervical spine, initial encounter: Secondary | ICD-10-CM | POA: Diagnosis not present

## 2021-04-17 DIAGNOSIS — S338XXA Sprain of other parts of lumbar spine and pelvis, initial encounter: Secondary | ICD-10-CM | POA: Diagnosis not present

## 2021-04-17 DIAGNOSIS — S233XXA Sprain of ligaments of thoracic spine, initial encounter: Secondary | ICD-10-CM | POA: Diagnosis not present

## 2021-04-18 ENCOUNTER — Other Ambulatory Visit: Payer: Self-pay

## 2021-04-18 ENCOUNTER — Encounter: Payer: Self-pay | Admitting: *Deleted

## 2021-04-18 ENCOUNTER — Other Ambulatory Visit: Payer: Self-pay | Admitting: *Deleted

## 2021-04-18 NOTE — Patient Outreach (Signed)
[facility]) Care Management  04/18/2021  Rachel Odonnell 1956/03/15 [id number]  [facility] follow outreach to MD referred patient Rachel Odonnell was referred by Wayland Denis, PA-C for uncontrolled diabetes on 03/19/21 [facility] admission February 19, 2021 and ED in March 06 2021  Insurance [employer] medicare and blue cross and blue shield state health plan    Follow up Assessment Rachel Royal verified HIPAA identifiers  She confirms returning from her cruise and today is camping. She continues to stay as active as possible  She reports some symptoms of fatigue with walking, extremity swelling with weight gain to 2228 lbs during her cruise She reports her weight has decreased to 219-220 at height of 5'4"  Diabetes She reports not following a diabetic diet during her cruise and estimates her Cbg values were in the 200s as she later confirms in the assessment she did not take her glucometer on her cruise She denies any symptoms of hypoglycemia Voices a lot of non compliance  Her preference continues to be to have her pcp manage her labs, foot care and medicines for her diabetes related to her reported previous experience with endocrinology visits being unaffordable for her (>$200) "to tell me I was taking the right medicines" She feels her pcp can complete these interventions for her  RN CM discussed other components of diabetes care to include a nutritionist, exercise plan, eye care and dental care She was encouraged to consider visits to a local nutritionist to develop an individualized meal plan for her  She confirms no structured diet or exercise plan but has eye and dental services annually plus is a member of [employer] with poor attendance related to worsening shortness of breath and jaw pain She reports she is a meat lover, likes and believes she would be able to maintain a keto diet reports she frequently eats fast foods, is a meat lover (Chicken, seafood+) She confirms she has  only received diabetic education with handouts. She reports diabetic education classes were offered but she did not attend any.  Cardiac symptoms In July 2022. Rachel Odonnell reports she presented to her primary care provider (PCP) with symptoms of  uncontrollably shaking, jaw pain and shortness of breath (sob). She reports today she believes her fatigue with walking during the cruise may be related to finding of her cardiac workup. She reports her stress test in July 2022 was negative. She reports she was informed she has emphysema She reports she is now on medicine to "open my arteries"   Respiratory symptoms She reports she was informed after her cardiac workup that she had emphysema after she was a former smoker. She reports she stopped smoking 26 years ago and was a 2 ppd smoker for 25 years, Quit in 1996 She is to establish care with Dr Vassie Loll Pulmonologist on 05/14/21  Review of the 03/07/21 CTA chest with contrast indicates mild coronary artery calcification, mild paraseptal emphysema, mild hepatic steatosis and aortic atherosclerosis  See pcp again 06/09/21 for A1C check,  last on March 03 2021   EMMI education sent via confirmed e-mail on chronic obstructive pulmonary disease: full program, coronary artery disease: treatment options, metabolic syndrome: hypertension, prediabetes and hyperlipidemia, Diabetes: Nutrition and healthy eating, Atherosclerosis  Patient Active Problem List   Diagnosis Date Noted   Diabetes (HCC) 05/31/2016   Hypertension 05/31/2016   Migraine with aura 05/31/2016   Constipation 11/03/2011   Overweight 11/03/2011   Stress incontinence 11/03/2011   Urinary tract stones 11/03/2011  Plans Patient agrees to care plan and follow up within the next 30 business days  Goals Addressed               This Visit's Progress     Patient Stated     Monitor and Manage My Blood Sugar-Diabetes Type 2 ([facility]) (pt-stated)   Not on track     Timeframe:  Long-Range  Goal Priority:  High Start Date:                04/18/21             Expected End Date:          06/16/21             Follow Up Date 05/14/21    - check blood sugar at prescribed times    Notes:  04/18/21 reports non compliance with checking cbg values and diet during recent cruise.       ;  Cala Bradford L. Noelle Penner, RN, BSN, CCM [facility] Telephonic Care Management Care Coordinator Office number [phone] Main [facility] number [phone] Fax number [phone]

## 2021-04-24 ENCOUNTER — Other Ambulatory Visit: Payer: Self-pay | Admitting: *Deleted

## 2021-04-24 ENCOUNTER — Encounter: Payer: Self-pay | Admitting: *Deleted

## 2021-04-24 NOTE — Patient Outreach (Signed)
[facility]) Care Management  04/24/2021  Rachel Odonnell Jul 17, 1956 [id number]  [facility] care coordination- questionable insured home visit  Incoming call from patient  She is able to verify her HIPAA identifiers    She inquired if RN CM was the staff visiting her today as she needed to cancel the appointment as she is not feeing well  She reports symptoms of dizziness and nausea  She has taken her Antivert and is attempting to rest  After confirming RN CM was not scheduled for a home visit with her, she located a number for Rachel Odonnell as [phone] who is scheduled to arrive for an in home wellness check   RN CM interventions Dialed with the patient to [phone] to leave a return number for the patient and to leave a message Dialed [phone] (listed on scanned [employer] card) Spoke with Rachel Odonnell. Rachel Odonnell called to [phone] without an response Rachel Odonnell called and transferred RN CM to [phone] , [facility] Spoke with Rachel Odonnell who was not able to find that Rachel Odonnell was scheduled for any annual wellness visits   Returned a call to Rachel Odonnell to update her that her [facility] staff states she is not scheduled for an annual home visit with them   Rachel Odonnell voiced appreciate with the RN CM care coordination and call attempts on her behalf She will rest and outreach to RN Cm prn   1506 & 1511 Pt returned calls updating RN CM she was contacted by "Rachel Odonnell from [place]" from a [place] number She informed this person she had called to cancel the visit. Rachel Odonnell disconnected to find the number she was called from as [phone] RN CM called the number and left a message requesting a return call [phone] Pt called RN CM  She reports she had time to rest and feels better  She to report she had received another call from another staff member stating they are from [place] with the intent to schedule her for a home visit.  Pt states she informed the person she had an  appointment for 04/24/21 at 2 pm but was called after 3 pm with cancellation Pt states she informed this person she was not interested in having any annual [employer] home visits  Pt agrees to RN CM outreach to possible [employer] manager CM received a call from "Rachel Odonnell" from [phone] providing poor identification of herself or the company she represents when RN CM inquired. She questioned if RN CM was "Rachel Odonnell" or "Rachel Odonnell" She disconnected the line without answer further RN CM questions RN CM outreached to [facility] staff to attempt to connect with [employer] contact   Plans Patient agrees to care plan and follow up within the next 30 business days  Rachel Odonnell L. Noelle Penner, RN, BSN, CCM [facility] Telephonic Care Management Care Coordinator Office number [phone] Main [facility] number [phone] Fax number [phone]

## 2021-04-25 ENCOUNTER — Other Ambulatory Visit: Payer: Self-pay | Admitting: *Deleted

## 2021-04-25 NOTE — Patient Outreach (Signed)
[facility]) Care Management  04/25/2021  Rachel Odonnell 03/28/1956 [id number]   [facility] Care coordination  Reviewed pt questionable outreach for an [employer] home visit with [facility] multidisciplinary team. Information located to identify this was related to scam call  [name] was updated  She has not had further calls and safety was encouraged  Plan [facility] RN CM will follow up with patient within the next 30 business days  Patient agrees to care plan   Cala Bradford L. Noelle Penner, RN, BSN, CCM [facility] Telephonic Care Management Care Coordinator Office number [phone] Mobile number [phone]  Main [facility] number [phone] Fax number [phone]

## 2021-04-28 DIAGNOSIS — R002 Palpitations: Secondary | ICD-10-CM | POA: Diagnosis not present

## 2021-05-06 ENCOUNTER — Institutional Professional Consult (permissible substitution): Payer: Medicare HMO | Admitting: Internal Medicine

## 2021-05-11 DIAGNOSIS — G4733 Obstructive sleep apnea (adult) (pediatric): Secondary | ICD-10-CM | POA: Diagnosis not present

## 2021-05-12 DIAGNOSIS — E1165 Type 2 diabetes mellitus with hyperglycemia: Secondary | ICD-10-CM | POA: Diagnosis not present

## 2021-05-12 DIAGNOSIS — K219 Gastro-esophageal reflux disease without esophagitis: Secondary | ICD-10-CM | POA: Diagnosis not present

## 2021-05-12 DIAGNOSIS — G2581 Restless legs syndrome: Secondary | ICD-10-CM | POA: Diagnosis not present

## 2021-05-12 DIAGNOSIS — Z791 Long term (current) use of non-steroidal anti-inflammatories (NSAID): Secondary | ICD-10-CM | POA: Diagnosis not present

## 2021-05-12 DIAGNOSIS — J302 Other seasonal allergic rhinitis: Secondary | ICD-10-CM | POA: Diagnosis not present

## 2021-05-12 DIAGNOSIS — E785 Hyperlipidemia, unspecified: Secondary | ICD-10-CM | POA: Diagnosis not present

## 2021-05-12 DIAGNOSIS — M1990 Unspecified osteoarthritis, unspecified site: Secondary | ICD-10-CM | POA: Diagnosis not present

## 2021-05-12 DIAGNOSIS — G8929 Other chronic pain: Secondary | ICD-10-CM | POA: Diagnosis not present

## 2021-05-12 DIAGNOSIS — R69 Illness, unspecified: Secondary | ICD-10-CM | POA: Diagnosis not present

## 2021-05-14 ENCOUNTER — Ambulatory Visit (INDEPENDENT_AMBULATORY_CARE_PROVIDER_SITE_OTHER): Payer: Medicare HMO | Admitting: Pulmonary Disease

## 2021-05-14 ENCOUNTER — Other Ambulatory Visit: Payer: Self-pay | Admitting: *Deleted

## 2021-05-14 ENCOUNTER — Other Ambulatory Visit: Payer: Self-pay

## 2021-05-14 ENCOUNTER — Encounter: Payer: Self-pay | Admitting: Pulmonary Disease

## 2021-05-14 VITALS — BP 122/58 | HR 58 | Temp 98.7°F | Ht 64.0 in | Wt 227.0 lb

## 2021-05-14 DIAGNOSIS — Z23 Encounter for immunization: Secondary | ICD-10-CM | POA: Diagnosis not present

## 2021-05-14 DIAGNOSIS — J432 Centrilobular emphysema: Secondary | ICD-10-CM | POA: Insufficient documentation

## 2021-05-14 DIAGNOSIS — R0600 Dyspnea, unspecified: Secondary | ICD-10-CM | POA: Diagnosis not present

## 2021-05-14 DIAGNOSIS — Z9989 Dependence on other enabling machines and devices: Secondary | ICD-10-CM | POA: Insufficient documentation

## 2021-05-14 DIAGNOSIS — G4733 Obstructive sleep apnea (adult) (pediatric): Secondary | ICD-10-CM | POA: Insufficient documentation

## 2021-05-14 DIAGNOSIS — I208 Other forms of angina pectoris: Secondary | ICD-10-CM | POA: Diagnosis not present

## 2021-05-14 DIAGNOSIS — R0609 Other forms of dyspnea: Secondary | ICD-10-CM

## 2021-05-14 NOTE — Patient Outreach (Signed)
[facility]) Care Management  05/14/2021  Rachel Odonnell 1956-07-13 [id number]   [facility] Unsuccessful outreach to MD referred patient Rachel Odonnell was referred by Wayland Denis, PA-C for uncontrolled diabetes on 03/19/21 [facility] admission February 19, 2021 and ED in March 06 2021   Insurance [employer] medicare and blue cross and blue shield state health plan  Last outreach was on 04/25/21 to provide an updated on concerns with a home visit  Outreach attempt to the home number [phone] No answer. [facility] RN CM left HIPAA [facility] Portability and Accountability Act) compliant voicemail message along with CM's contact info.   Plan: [facility] RN CM scheduled this patient for another call attempt within 30 business days Unsuccessful outreach on 05/14/21   Cala Bradford L. Noelle Penner, RN, BSN, CCM [facility] Telephonic Care Management Care Coordinator Office number [phone] Mobile number [phone]  Main [facility] number [phone] Fax number [phone]

## 2021-05-14 NOTE — Assessment & Plan Note (Signed)
Her persistent jaw pain is still concerning to me, this has decreased on the Imdur but she had a couple of episodes. I have asked her to contact cardiology should the frequency worsen.  Normal dobutamine stress echo is reassuring

## 2021-05-14 NOTE — Progress Notes (Signed)
Subjective:    Patient ID: Rachel Odonnell, female    DOB: [birth year], 65 y.o.   MRN: [id number]  HPI  Chief Complaint  Patient presents with   Consult    Referred by cardiologist for SOB with exertion and pain in jaws from SOB. On meds to dilate arteries, pain and SOB has not been going on as much since beginning this medication but still present. Dx of mild emphysema. Does have a cough non productive.    65 year old heavy ex-smoker referred for evaluation of emphysema by cardiology. She presented with shortness of breath and pain radiating to her jaws over the last 6 months which occurred when walking and improved on stopping and resting.  She also reports low energy levels and an early morning cough  She had an ED visit on 7/21 for chest and jaw pain Dobutamine stress echo did not reveal any wall motion abnormality or significant ischemia, nml LVEF. She had neck pain at peak exercise. In 2019 showed a normal nuclear stress test.  I reviewed cardiology evaluation.  She was started on Imdur 30 mg daily.  She states that in spite of taking this she has had at least 2 episodes of jaw pain on exertion -She had a CT angiogram of the chest which showed mild emphysema, hence the referral She denies wheezing or frequent chest colds She smoked 2 packs/day starting as a teenager, about 40 pack years until she quit in 1996 she also reports exposure to secondhand smoke from her father.  She was on a cruise recently and had to use a wheelchair  PMH - ? Narcolepsy vs ADD -she was on Adderall which was stopped by PCP earlier this year. -Severe OSA was diagnosed in 2022 with AHI 43/hour and maintained on nasal CPAP of 10 cm  She lives with her husband and is a retired Secondary school teacher, still works part-time at the [employer], used to work Editor, commissioning  Significant tests/ events reviewed  CTA chest 02/2021 mild coronary artery calcification, mild paraseptal emphysema  Past Medical History:   Diagnosis Date   ADD (attention deficit disorder)    Diabetes mellitus without complication (HCC)    Hypertension    Past Surgical History:  Procedure Laterality Date   INCONTINENCE SURGERY      Allergies  Allergen Reactions   Azithromycin Hives   Codeine Other (See Comments)   Atomoxetine Nausea And Vomiting   Atorvastatin Other (See Comments)    Restless legs   Lisinopril Nausea And Vomiting    Social History   Socioeconomic History   Marital status: Married    Spouse name: Shon Hale   Number of children: Not on file   Years of education: Not on file   Highest education level: Not on file  Occupational History   Not on file  Tobacco Use   Smoking status: Former    Packs/day: 2.00    Types: Cigarettes    Quit date: 08/17/1994    Years since quitting: 26.7   Smokeless tobacco: Never  Substance and Sexual Activity   Alcohol use: No   Drug use: No   Sexual activity: Not on file  Other Topics Concern   Not on file  Social History Narrative   Lives at home with husband, Shon Hale   Former smoker Smoked 2 ppd x 25 yrs; Quit in 1996   Previous worked in the prison system- sheriff department   Now on fixed income from her SSI and pension   Social Determinants  of Health   Financial Resource Strain: Low Risk    Difficulty of Paying Living Expenses: Not very hard  Food Insecurity: No Food Insecurity   Worried About Running Out of Food in the Last Year: Never true   Ran Out of Food in the Last Year: Never true  Transportation Needs: No Transportation Needs   Lack of Transportation (Medical): No   Lack of Transportation (Non-Medical): No  Physical Activity: Unknown   Days of Exercise per Week: 1 day   Minutes of Exercise per Session: Not on file  Stress: No Stress Concern Present   Feeling of Stress : Only a little  Social Connections: Press photographer of Communication with Friends and Family: More than three times a week   Frequency of Social Gatherings  with Friends and Family: More than three times a week   Attends Religious Services: More than 4 times per year   Active Member of [employer] or Organizations: Yes   Attends Engineer, structural: More than 4 times per year   Marital Status: Married  Catering manager Violence: Not At Risk   Fear of Current or Ex-Partner: No   Emotionally Abused: No   Physically Abused: No   Sexually Abused: No     Family History  Problem Relation Age of Onset   Heart Problems Father    COPD Father    Emphysema Father    Heart block Father    Congestive Heart Failure Father      Review of Systems Shortness of breath with activity Early morning nonproductive cough Tooth problems Headaches Feet swelling Joint stiffness  Constitutional: negative for anorexia, fevers and sweats  Eyes: negative for irritation, redness and visual disturbance  Ears, nose, mouth, throat, and face: negative for earaches, epistaxis, nasal congestion and sore throat  Respiratory: negative for sputum and wheezing  Cardiovascular: negative for chest pain,  lower extremity edema, orthopnea, palpitations and syncope  Gastrointestinal: negative for abdominal pain, constipation, diarrhea, melena, nausea and vomiting  Genitourinary:negative for dysuria, frequency and hematuria  Hematologic/lymphatic: negative for bleeding, easy bruising and lymphadenopathy  Musculoskeletal:negative for arthralgias, muscle weakness and stiff joints  Neurological: negative for coordination problems, gait problems, headaches and weakness  Endocrine: negative for diabetic symptoms including polydipsia, polyuria and weight loss     Objective:   Physical Exam  Gen. Pleasant, obese, in no distress, normal affect ENT - no pallor,icterus, no post nasal drip, class 2-3 airway Neck: No JVD, no thyromegaly, no carotid bruits Lungs: no use of accessory muscles, no dullness to percussion, decreased without rales or rhonchi  Cardiovascular: Rhythm  regular, heart sounds  normal, no murmurs or gallops, no peripheral edema Abdomen: soft and non-tender, no hepatosplenomegaly, BS normal. Musculoskeletal: No deformities, no cyanosis or clubbing Neuro:  alert, non focal, no tremors       Assessment & Plan:

## 2021-05-14 NOTE — Patient Instructions (Addendum)
Schedule PFTs If jaw pain persists, please call cardiology

## 2021-05-14 NOTE — Assessment & Plan Note (Signed)
Mild paraseptal emphysema was noted on her CT angiogram chest in July. We will obtain PFTs to clarify but I doubt that this is the main issue or the reason for her dyspnea.  She has been quit smoking since 1996

## 2021-05-14 NOTE — Assessment & Plan Note (Signed)
She seems to have severe OSA, maintained on CPAP, DME is [employer].  She has good results with improvement in her daytime somnolence and fatigue.  She appears to be compliant by report

## 2021-05-15 DIAGNOSIS — S233XXA Sprain of ligaments of thoracic spine, initial encounter: Secondary | ICD-10-CM | POA: Diagnosis not present

## 2021-05-15 DIAGNOSIS — M9903 Segmental and somatic dysfunction of lumbar region: Secondary | ICD-10-CM | POA: Diagnosis not present

## 2021-05-15 DIAGNOSIS — S134XXA Sprain of ligaments of cervical spine, initial encounter: Secondary | ICD-10-CM | POA: Diagnosis not present

## 2021-05-15 DIAGNOSIS — S338XXA Sprain of other parts of lumbar spine and pelvis, initial encounter: Secondary | ICD-10-CM | POA: Diagnosis not present

## 2021-05-15 DIAGNOSIS — M9901 Segmental and somatic dysfunction of cervical region: Secondary | ICD-10-CM | POA: Diagnosis not present

## 2021-05-15 DIAGNOSIS — M9902 Segmental and somatic dysfunction of thoracic region: Secondary | ICD-10-CM | POA: Diagnosis not present

## 2021-05-19 NOTE — Progress Notes (Signed)
Cardiology Office Note  Date: 05/20/2021   ID: [name], Rachel Odonnell 02, 1957, MRN [id number]  PCP:  Rachel Shivers, PA-C  Cardiologist:  None Electrophysiologist:  None   Chief Complaint: Chest pain  History of Present Illness: Rachel Odonnell is a 65 y.o. female with a history of DM2, HTN  She was last seen by Dr. Purvis Sheffield 03/21/2018 for follow-up for dyspnea on exertion, chest pain unspecified, fatigue, DM2.  She had a nuclear stress test on 03/11/2018 which was normal with a EF of 55 to 60%.  He noted since her last visit she had 1 mild episode of retrosternal chest tightness radiating to left arm.  Her exertional dyspnea had improved.  She had a sleep study several years prior and was told she did not have sleep apnea.  She was told she had narcolepsy and was taking Adderall.  Her symptoms have improved since her prior visit.  She was told to contact Dr. Purvis Sheffield should she have recurrent and progressive symptoms.  Her diabetes was poorly controlled with her most recent A1c of 8.9%.  She was taking Janumet and Trulicity.  She had a visit to [facility] [place] on 03/06/2021 with complaints of shortness of breath and chest pain for several months which she described as continuing to become worse.  She stated she had a stress test a week prior and was told it was normal.  She was scheduled to see cardiology in August but stated she became short of breath with a little activity and chest pain was also becoming worse.  Initial troponin was 4 with a delta of 1.  CBC was normal.  Glucose is 198, AST was 66.  She had a CT of the chest with results noted below.  There was some evidence of coronary artery calcification and emphysema.  Also evidence of hepatic steatosis.   Recently saw Dr. Vassie Odonnell on 05/14/2021.  He mentioned she seemed to have severe OSA maintained on CPAP.  She had good results with improvement in daytime somnolence and fatigue.  Plans were to obtain PFTs to clarify but Dr. Vassie Odonnell  doubted this was the main issue or reason for her dyspnea.  He mentioned she complained of jaw pain and if jaw pain persists to call cardiology.   She is here to follow-up on ZIO monitor.  We discussed the results of ZIO monitor.  States she recently had 2 episodes of chest pain while walking at [facility].  She states she has not been totally compliant with taking her Imdur.  She states she sometimes forgets.  She is wondering if the chest pain Odonnell have had something to do with her missing a dose or 2 of the medication.  We talked about her recent dobutamine stress test.  Which was reassuring.  She has upcoming PFTs with Dr. Vassie Odonnell.  She states she has been feeling fatigued since stopping her Adderall for narcolepsy.  She does have sleep apnea which has been managed by her PCP per her statement.  I advised her to speak with Dr. Vassie Odonnell regarding increased fatigue and sleepiness.  He mentioned maybe this could be related to the fact she Odonnell need CPAP titration.  Otherwise she denies any issues.  Blood pressure is elevated today but usually within normal limits.   Past Medical History:  Diagnosis Date   ADD (attention deficit disorder)    Diabetes mellitus without complication (HCC)    Hypertension     Past Surgical History:  Procedure  Laterality Date   INCONTINENCE SURGERY      Current Outpatient Medications  Medication Sig Dispense Refill   aspirin EC 81 MG tablet Take 81 mg by mouth daily.     citalopram (CELEXA) 40 MG tablet Take 40 mg by mouth daily.     diclofenac (VOLTAREN) 75 MG EC tablet Take 1 tablet by mouth 2 (two) times daily.     estradiol (ESTRACE) 2 MG tablet Take 2 mg by mouth daily.     EUTHYROX 25 MCG tablet Take 1 tablet by mouth daily.     fenofibrate (TRICOR) 145 MG tablet Take 145 mg by mouth daily.     fluorometholone (FML) 0.1 % ophthalmic suspension as needed.     glipiZIDE (GLUCOTROL) 5 MG tablet Take 3 tablets by mouth daily. Take 5 mg in the morning and 10 mg in  the evening     isosorbide mononitrate (IMDUR) 30 MG 24 hr tablet Take 1 tablet (30 mg total) by mouth daily. 90 tablet 1   levocetirizine (XYZAL) 5 MG tablet Take 5 mg by mouth every evening.     metFORMIN (GLUCOPHAGE) 500 MG tablet Take 1,000 mg by mouth 2 (two) times daily with a meal.     montelukast (SINGULAIR) 10 MG tablet Take 10 mg by mouth at bedtime.     Omega-3 1000 MG CAPS Take by mouth.     Omega-3 Fatty Acids (FISH OIL) 1000 MG CAPS Take by mouth.     omeprazole (PRILOSEC) 40 MG capsule Take 40 mg by mouth daily.     pioglitazone (ACTOS) 15 MG tablet Take 1 tablet by mouth daily.     rOPINIRole (REQUIP) 2 MG tablet Take 2 mg by mouth 2 (two) times daily.     rosuvastatin (CRESTOR) 10 MG tablet Take 1 tablet by mouth daily.     tobramycin (TOBREX) 0.3 % ophthalmic solution Place 1 drop into both eyes 4 (four) times daily.     TRULICITY 0.75 MG/0.5ML SOPN Inject 0.75 mg into the skin once a week.     No current facility-administered medications for this visit.   Allergies:  Azithromycin, Codeine, Atomoxetine, Atorvastatin, and Lisinopril   Social History: The patient  reports that she quit smoking about 26 years ago. Her smoking use included cigarettes. She smoked an average of 2 packs per day. She has never used smokeless tobacco. She reports that she does not drink alcohol and does not use drugs.   Family History: The patient's family history includes COPD in her father; Congestive Heart Failure in her father; Emphysema in her father; Heart Problems in her father; Heart block in her father.   ROS:  Please see the history of present illness. Otherwise, complete review of systems is positive for none.  All other systems are reviewed and negative.   Physical Exam: VS:  BP (!) 142/70   Pulse (!) 58   Ht 5\' 4"  (1.626 m)   Wt 225 lb (102.1 kg)   SpO2 96%   BMI 38.62 kg/m , BMI Body mass index is 38.62 kg/m.  Wt Readings from Last 3 Encounters:  05/20/21 225 lb (102.1 kg)   05/14/21 227 lb 0.6 oz (103 kg)  03/28/21 220 lb (99.8 kg)    General: Patient appears comfortable at rest. Neck: Supple, no elevated JVP or carotid bruits, no thyromegaly. Lungs: Clear to auscultation, nonlabored breathing at rest. Cardiac: Regular rate and rhythm, no S3 or significant systolic murmur, no pericardial rub. Extremities: No pitting edema,  distal pulses 2+. Skin: Warm and dry. Musculoskeletal: No kyphosis. Neuropsychiatric: Alert and oriented x3, affect grossly appropriate.  ECG:    Recent Labwork: No results found for requested labs within last 8760 hours.  No results found for: CHOL, TRIG, HDL, CHOLHDL, VLDL, LDLCALC, LDLDIRECT  Other Studies Reviewed Today:  Cardiac monitor 03/18/2021 12 day monitor Rare supraventricular ectopy in the form of isolated PACs, couplets, triplets. Fourteen episodes of SVT longest 8 beats Rare ventricular ectopy in the form of isolated PVCs, couplets. Four episodes of NSVT longest 15 beats. Reported symptoms correlated with sinus rhythm, PACs, PVCs, and afib Episodes of afib <1% burden, rates were controlled Single nocturnal pause 3.1 seconds    Dobutamine stress test 02/19/2021 [facility] cardiology  Stress Dobutamine (02/19/2021 10:30 AM EDT) Specimen (Source) Anatomical Location / Laterality Collection Method / Volume Collection Time Received Time        02/19/2021 9:48 AM EDT     Imaging Results - Echocardiogram Stress Dobutamine (02/19/2021 10:30 AM EDT) Narrative  02/19/2021 11:32 AM EDT   Patient Info Name:     Lendon Collar Tarver Age:     64 years DOB:     1955-11-11 Gender:     Female MRN:     [id number] Accession #:     [id number] Ht:     163 cm Wt:     99 kg BSA:     2.16 m2 BP:     148 /     56 mmHg Technical Quality:     Fair Exam Date:     02/19/2021 9:48 AM Site Location:     [place] Exam Location:     [place] Admit Date:     02/19/2021  Exam Type:     ECHOCARDIOGRAM STRESS DOBUTAMINE  Study  Info Indications     - dyspnea, jaw pain  Dobutamine stress echocardiogram is performed.  Staff Referring Physician:     Royann Odonnell Ordering Physician:     Rachel Odonnell Sonographer:     Sinda Du  Account #:     [id number]   Summary  1. Pharmacologic stress echocardiogram does not reveal wall motion abnormality consistent with significant ischemia.  2. Patient symptom of jaw pain was reproduced at peak stress (reason for stress test).  No chest discomfort reported.   Stress Echo Findings Left Ventricle  Normal left ventricular systolic function with no regional wall motion abnormalities noted at rest.  No regional wall motion abnormalities noted post stress.   Left Ventricle  The left ventricle is normal in size with normal wall thickness.  The left ventricular systolic function is normal, LVEF is visually estimated at 60-65%.  Right Ventricle  The right ventricle is normal in size, with normal systolic function.   Left Atrium  The left atrium is mildly dilated in size.  Right Atrium  The right atrium is normal  in size.   Aortic Valve  The aortic valve is trileaflet with mildly thickened leaflets with normal excursion.  There is mild aortic regurgitation.  There is no evidence of a significant transvalvular gradient.  Aortic valve sclerosis without stenosis.  Pulmonary Valve  Pulmonary valve is not well visualized.  Mitral Valve  There is no significant mitral valve regurgitation.  There is no mitral stenosis.  Tricuspid Valve  There is no significant tricuspid regurgitation.   Other Findings  Rhythm: Sinus Rhythm.  Pericardium  There is no pericardial effusion.  Aorta  The aorta is normal in size in  the visualized segments.   Protocol:     Dobutamine Rest HR:     61 bpm Peak HR:     144 bpm Rest Sys BP:     148 mmHg Rest Dias BP:     56 mmHg Peak Sys BP:     227 mmHg Peak Dias BP:     68 mmHg Max Pred HR:      155 bpm % Max Pred HR:     93 % Target HR:     132 bpm Max RPP:     32,688 bpm*mmHg Target HR Summary:     Patient's target heart rate was not achieved due to fatigue BP Response:     Normal blood pressure response Termination Reason:     Reached target heart rate or workload Total Time:     10 min : 25 sec Resting ECG  Normal sinus rhythm. Stress ECG  Borderline ST depression - inferior leads. Arrhythmias  Frequent PACs.   Left Ventricular Outflow Tract ---------------------------------------------------------------------- Name                                 Value        Normal ----------------------------------------------------------------------  LVOT 2D ---------------------------------------------------------------------- LVOT Diameter                       1.8 cm                LVOT Area                          2.5 cm2  Aorta ---------------------------------------------------------------------- Name                                 Value        Normal ----------------------------------------------------------------------  Ascending Aorta ---------------------------------------------------------------------- Asc Ao Diameter                     3.6 cm  Ventricles ---------------------------------------------------------------------- Name                                 Value        Normal ----------------------------------------------------------------------  LV Dimensions 2D/MM ---------------------------------------------------------------------- LVOT Diameter                       1.8 cm   Report Signatures Echo Finalized by [employer]  Assar  DO on 02/19/2021 11:32 AM Stress ECG Finalize by Rayetta Pigg  Assar  DO on 02/19/2021 11:32 AM     Imaging Results - Echocardiogram Stress Dobutamine (02/19/2021 10:30 AM EDT) Procedure Note  Assar, Soheil, DO - 02/19/2021   Formatting of this note might be different from the original. Patient Info Name: GERMANY DODGEN  Dimascio Age: 81 years DOB: 10-Aug-1956 Gender: Female MRN: [id number] Accession #: [id number] Ht: 163 cm Wt: 99 kg BSA: 2.16 m2 BP: 148 / 56 mmHg Technical Quality: Fair Exam Date: 02/19/2021 9:48 AM Site Location: [place] Exam Location: [place] Admit Date: 02/19/2021  Exam Type: ECHOCARDIOGRAM STRESS DOBUTAMINE  Study Info Indications - dyspnea, jaw pain  Dobutamine stress echocardiogram is performed.  Staff Referring Physician: Royann Odonnell Ordering Physician: Rachel Odonnell Sonographer: Sinda Du  Account #: [id number]   Summary  1. Pharmacologic stress echocardiogram does not reveal wall motion abnormality consistent with significant ischemia. 2. Patient symptom of jaw pain was reproduced at peak stress (reason for stress test). No chest discomfort reported.     03/07/2021  CTA  Chest W Contrast  Anatomical Region Laterality Modality  Chest -- Computed Tomography  Vascular -- --    Impression  Slightly limited examination without evidence of acute pulmonary  embolism through the proximal segmental arteries. Distal segmental  and subsegmental pulmonary arteries are not well assessed on this  examination.   Mild coronary artery calcification.   Mild paraseptal emphysema.   Mild hepatic steatosis   Aortic Atherosclerosis (ICD10-I70.0) and Emphysema (ICD10-J43.9).       Stress Myoview 03/11/2018 Study Result  Narrative & Impression  There was no ST segment deviation noted during stress. The study is normal. There are no perfusion defects This is a low risk study. The left ventricular ejection fraction is normal (55-65%).      Assessment and Plan:  1. DOE (dyspnea on exertion)   2. Chest pain of uncertain etiology   3. Type 2 diabetes mellitus without complication, without long-term current use of insulin (HCC)   4. History of tobacco abuse   5. Palpitations     1. DOE (dyspnea on exertion) Complaining  of DOE on mild to moderate exertion.  History of smoking x25 years and significant exposure to secondhand smoke from her father who was a heavy smoker until his death recently.  Recent CT scan showed evidence of mild paraseptal emphysema, coronary artery calcifications, and hepatic steatosis.  Please refer to pulmonology either Dr. Vassie Odonnell or Dr. Sherene Sires in [place] for evaluation.  She has upcoming PFTs with Dr. Vassie Odonnell.  I advised her to speak with Dr. Vassie Odonnell regarding her sleepiness.  She states she was previously on Adderall due to symptoms of narcolepsy.  It Odonnell be that she needs adjustment to her CPAP.  2. Chest pain of uncertain etiology At previous visit we started her on Imdur 30 mg p.o. daily.  She states she recently went to [facility] and was walking and had 2 subsequent episodes of chest pain.  She states sometimes she forgets to take the Imdur and is wondering if these episodes Odonnell have been related to not taking the medication.  Advised her to increase Imdur dose to 60 mg daily.  If chest pain persists Odonnell need to speak to primary cardiologist for possible investigation by cardiac catheterization.  She had a recent stress test at [facility] on 03/11/2021 which did not reveal wall motion abnormality consistent with significant ischemia.  Symptom of jaw pain was reproduced at peak stress.  No chest discomfort.  She had normal left ventricular systolic function with no regional wall motion abnormalities.  No regional wall motion abnormalities noted post-rest.  LVEF was estimated at 60 to 65%.  There was mild aortic regurgitation.  03/11/2021  3. Type 2 diabetes mellitus without complication, without long-term current use of insulin [facility]) Patient states she has been having some issues with controlling her diabetes.  She is taking metformin which causes her a significant amount of diarrhea.  Recent random glucose in the emergency room 11 days ago 198.  4. History of tobacco abuse History of tobacco  abuse.  She used to smoke approximately 25 years and has quit approximately 25 years but states she has been exposed to significant secondhand smoke from taking care of her father who was a heavy smoker for a long  time until his death last year.  She has upcoming PFTs with Dr. Vassie Odonnell.  5.  Palpitations/racing heart Recent ZIO monitor showed rare supraventricular ectopy in the form PACs, couplets, triplets, 14 episodes of SVT longest was 8 beats.  Rare ventricular ectopy therapy in the form of isolated PVCs, couplets.  4 episodes of NSVT longest was 15 beats.  The reported symptoms correlated with sinus rhythm, PACs, PVCs and atrial fibrillation.  Episodes of atrial fibrillation less than 1% burden.  Rates were controlled.  She had 1 single nocturnal pause of 3.1-second.  Medication Adjustments/Labs and Tests Ordered: Current medicines are reviewed at length with the patient today.  Concerns regarding medicines are outlined above.   Disposition: Follow-up with Dr. Wyline Mood or APP 3 months  Signed, Rennis Harding, NP 05/20/2021 2:56 PM    [facility] [address] [place], [place], [place] [address] Phone: [phone]; Fax: [phone]

## 2021-05-20 ENCOUNTER — Ambulatory Visit (INDEPENDENT_AMBULATORY_CARE_PROVIDER_SITE_OTHER): Payer: Medicare HMO | Admitting: Family Medicine

## 2021-05-20 ENCOUNTER — Encounter: Payer: Self-pay | Admitting: Family Medicine

## 2021-05-20 VITALS — BP 142/70 | HR 58 | Ht 64.0 in | Wt 225.0 lb

## 2021-05-20 DIAGNOSIS — R002 Palpitations: Secondary | ICD-10-CM

## 2021-05-20 DIAGNOSIS — R079 Chest pain, unspecified: Secondary | ICD-10-CM

## 2021-05-20 DIAGNOSIS — R0609 Other forms of dyspnea: Secondary | ICD-10-CM | POA: Diagnosis not present

## 2021-05-20 DIAGNOSIS — E119 Type 2 diabetes mellitus without complications: Secondary | ICD-10-CM

## 2021-05-20 DIAGNOSIS — Z87891 Personal history of nicotine dependence: Secondary | ICD-10-CM | POA: Diagnosis not present

## 2021-05-20 MED ORDER — ISOSORBIDE MONONITRATE ER 60 MG PO TB24: 60.0000 mg | ORAL_TABLET | Freq: Every day | ORAL | 3 refills | Status: DC

## 2021-05-20 NOTE — Patient Instructions (Addendum)
Medication Instructions:  Your physician has recommended you make the following change in your medication: Increase Imdur to 60 mg daily. Continue all other medications the same.   Labwork: None  Testing/Procedures: None  Follow-Up: Your physician recommends that you schedule a follow-up appointment in: 3 months   Any Other Special Instructions Will Be Listed Below (If Applicable).  If you need a refill on your cardiac medications before your next appointment, please call your pharmacy.

## 2021-05-28 ENCOUNTER — Other Ambulatory Visit: Payer: Self-pay | Admitting: *Deleted

## 2021-05-28 ENCOUNTER — Other Ambulatory Visit: Payer: Self-pay

## 2021-05-28 NOTE — Patient Outreach (Signed)
[facility]) Care Management  05/28/2021  TERRYL NIZIOLEK 1956-08-03 [id number]  [facility] follow up outreach to MD referred patient Mrs YU CRAGUN was referred by Wayland Denis, PA-C for uncontrolled diabetes on 03/19/21 [facility] admission February 19, 2021 and ED in March 06 2021   Insurance [employer] medicare and blue cross and blue shield state health plan   Mrs Bobe is able to verify her HIPAA identifiers  Assessment Dizzy is a complaint today On Imdur that was recently increased to 60 mg Saw pulmonology Dr Vassie Loll on 05/14/21 after   Discuss low blood pressure and risks  Need to get her blood cuff from her daughter  Did a virtual visit with Orthoptist vs home visit   Nutrition [place] information provided as [address] [place] [phone] and [facility]  [place]  She will using blue cross and blue shield for filing services  Pt discussed she has gathered various disease management information and is trying to review them    Plan Patient agrees to care plan and follow up within the next 30 business days   Justina Bertini L. Noelle Penner, RN, BSN, CCM [facility] Telephonic Care Management Care Coordinator Office number [phone] Main [facility] number [phone] Fax number [phone]

## 2021-06-02 ENCOUNTER — Telehealth: Payer: Self-pay | Admitting: Family Medicine

## 2021-06-02 NOTE — Telephone Encounter (Signed)
New message    Pt c/o medication issue:  1. Name of Medication: Imdur  2. How are you currently taking this medication (dosage and times per day)? 1 60 mg a day   3. Are you having a reaction (difficulty breathing--STAT)? yes  4. What is your medication issue? Since increase in medication - she has been having dizzy spells, can she back the medication back down until she gets back from vacation and can come into office ?

## 2021-06-02 NOTE — Telephone Encounter (Signed)
Patient had Imdur increased to 60 mg 2 weeks ago.Since then she has had headaches and fatigue. Her BP was 152/70 when she checked. She has dropped dose back to 30 mg qd and will monitor her BP/HR for several days and then update [place]. I told her headaches can happen as she acelomates to dose increase and a to use tylenol as directed.    I will FYI A.Vincenza Hews, NP

## 2021-06-04 ENCOUNTER — Telehealth: Payer: Self-pay | Admitting: Family Medicine

## 2021-06-04 NOTE — Telephone Encounter (Signed)
Since the increase of the Imdur pt is having shortness of breath, been having some "twings" in her heart area, fatigue, high bp, headaches, dizziness -- she can barely get out of bed.   This has been going on for a few weeks.   Please call [phone]

## 2021-06-05 NOTE — Telephone Encounter (Signed)
Currently in [place], [place] until tomorrow Reports after walking, feels aching chest pain, rated 3/10, that lasts 5 minutes and stops after resting. Reports this pain started over the weekend after she arrived in [place]. Reports SOB Denies dizziness Denies active chest pain Denies respiratory symptoms Offered appointment for Monday but declined since has PCP appointment Scheduled appointment on 06/10/21 @1 :30 pm with , NP Advised if symptoms get worse between now and then, to go to the ED for an evaluation Verbalized understanding of plan

## 2021-06-09 ENCOUNTER — Encounter: Payer: Self-pay | Admitting: Cardiology

## 2021-06-09 ENCOUNTER — Ambulatory Visit: Payer: Medicare HMO | Admitting: Family Medicine

## 2021-06-09 DIAGNOSIS — Z7689 Persons encountering health services in other specified circumstances: Secondary | ICD-10-CM | POA: Diagnosis not present

## 2021-06-09 DIAGNOSIS — E785 Hyperlipidemia, unspecified: Secondary | ICD-10-CM | POA: Diagnosis not present

## 2021-06-09 DIAGNOSIS — E1165 Type 2 diabetes mellitus with hyperglycemia: Secondary | ICD-10-CM | POA: Diagnosis not present

## 2021-06-09 DIAGNOSIS — J309 Allergic rhinitis, unspecified: Secondary | ICD-10-CM | POA: Diagnosis not present

## 2021-06-09 DIAGNOSIS — F419 Anxiety disorder, unspecified: Secondary | ICD-10-CM | POA: Diagnosis not present

## 2021-06-09 DIAGNOSIS — Z6838 Body mass index (BMI) 38.0-38.9, adult: Secondary | ICD-10-CM | POA: Diagnosis not present

## 2021-06-09 DIAGNOSIS — H16139 Photokeratitis, unspecified eye: Secondary | ICD-10-CM | POA: Diagnosis not present

## 2021-06-09 DIAGNOSIS — E039 Hypothyroidism, unspecified: Secondary | ICD-10-CM | POA: Diagnosis not present

## 2021-06-09 DIAGNOSIS — K219 Gastro-esophageal reflux disease without esophagitis: Secondary | ICD-10-CM | POA: Diagnosis not present

## 2021-06-09 DIAGNOSIS — G4733 Obstructive sleep apnea (adult) (pediatric): Secondary | ICD-10-CM | POA: Diagnosis not present

## 2021-06-09 DIAGNOSIS — R69 Illness, unspecified: Secondary | ICD-10-CM | POA: Diagnosis not present

## 2021-06-09 NOTE — Progress Notes (Addendum)
Cardiology Office Note  Date: 06/10/2021   ID: Rachel, Odonnell 12-30-1955, MRN [id number]  PCP:  Royann Shivers, PA-C  Cardiologist:  None Electrophysiologist:  None   Chief Complaint: Chest pain  History of Present Illness: Rachel Odonnell is a 65 y.o. female with a history of DM2, HTN   Recently saw Dr. Vassie Loll on 05/14/2021.  He mentioned she seemed to have severe OSA maintained on CPAP.  She had good results with improvement in daytime somnolence and fatigue.  Plans were to obtain PFTs to clarify but Dr. Vassie Loll doubted this was the main issue or reason for her dyspnea.  He mentioned she complained of jaw pain and if jaw pain persists to call cardiology.  She was last here to follow-up on ZIO monitor.  We discussed the results of ZIO monitor.  She recently had 2 episodes of chest pain while walking at [facility].  He had not been totally compliant with taking her Imdur.  She stated she sometimes forgot.  She is wondering if the chest pain may have had something to do with her missing a dose or 2 of the medication to take it.  We talked about her recent dobutamine stress test which was reassuring.  She had upcoming PFTs with Dr. Vassie Loll.  She had been feeling fatigued since stopping her Adderall for narcolepsy.  She does have sleep apnea which has been managed by her PCP per her statement.  I advised her to speak with Dr. Vassie Loll regarding increased fatigue and sleepiness.  He mentioned maybe this could be related to the fact she may need CPAP titration.  Otherwise she denied any issues.   Imdur was increased at last visit to 60 mg.  Patient called on 06/04/2021 complaining of shortness of breath and having some "twinges" in her heart area, fatigue, high blood pressure, headache, dizziness, can barely get out of bed after increasing Imdur.  She described this as occurring over the prior 3 weeks.  She reported walking with sensation of aching chest pain.  She rated it 3 out of 10 lasting 5  minutes and stops after resting.  Also reported some accompanying shortness of breath.   She is here today for follow-up with continued complaints of increased shortness of breath.  She states it feels like it is getting worse.  She states she was recently in [facility] and was carrying light bags to her cabin when she started having significant shortness of breath/DOE with some midsternal chest pain which was relieved with rest.  She states she becomes significantly winded when she was exiting her car and entering the clinic today.  She states no one has contacted her regarding PFTs pulmonology had ordered for her.  We discussed possible cardiac catheterization as a next step.  We discussed the process and the risk and benefits.  She is willing to proceed stating she just wants to find out the real issue behind her shortness of breath and chest pain.  She states she could not tolerate the increased dose of Imdur and went back down to 30 but yesterday started the Imdur 60 mg of back again.  EKG today shows normal sinus rhythm rate of 61, septal infarct, age undetermined.  Past Medical History:  Diagnosis Date   ADD (attention deficit disorder)    Diabetes mellitus without complication (HCC)    Hypertension     Past Surgical History:  Procedure Laterality Date   INCONTINENCE SURGERY  Current Outpatient Medications  Medication Sig Dispense Refill   aspirin EC 81 MG tablet Take 81 mg by mouth daily.     citalopram (CELEXA) 40 MG tablet Take 40 mg by mouth daily.     diclofenac (VOLTAREN) 75 MG EC tablet Take 1 tablet by mouth 2 (two) times daily.     estradiol (ESTRACE) 2 MG tablet Take 2 mg by mouth daily.     EUTHYROX 25 MCG tablet Take 1 tablet by mouth daily.     fenofibrate (TRICOR) 145 MG tablet Take 145 mg by mouth daily.     fluorometholone (FML) 0.1 % ophthalmic suspension as needed.     Insulin Glargine (TOUJEO SOLOSTAR [place]) Inject into the skin once a week.      isosorbide mononitrate (IMDUR) 60 MG 24 hr tablet Take 1 tablet (60 mg total) by mouth daily. (Patient taking differently: Take 30 mg by mouth daily.) 90 tablet 3   levocetirizine (XYZAL) 5 MG tablet Take 5 mg by mouth every evening.     metFORMIN (GLUCOPHAGE) 500 MG tablet Take 1,000 mg by mouth 2 (two) times daily with a meal.     montelukast (SINGULAIR) 10 MG tablet Take 10 mg by mouth at bedtime.     Omega-3 Fatty Acids (FISH OIL) 1000 MG CAPS Take 1 capsule by mouth daily.     omeprazole (PRILOSEC) 40 MG capsule Take 40 mg by mouth daily.     rOPINIRole (REQUIP) 2 MG tablet Take 2 mg by mouth 2 (two) times daily.     rosuvastatin (CRESTOR) 10 MG tablet Take 1 tablet by mouth daily.     No current facility-administered medications for this visit.   Allergies:  Azithromycin, Codeine, Atomoxetine, Atorvastatin, and Lisinopril   Social History: The patient  reports that she quit smoking about 26 years ago. Her smoking use included cigarettes. She smoked an average of 2 packs per day. She has never used smokeless tobacco. She reports that she does not drink alcohol and does not use drugs.   Family History: The patient's family history includes COPD in her father; Congestive Heart Failure in her father; Emphysema in her father; Heart Problems in her father; Heart block in her father.   ROS:  Please see the history of present illness. Otherwise, complete review of systems is positive for none.  All other systems are reviewed and negative.   Physical Exam: VS:  BP 134/72   Pulse 63   Ht 5\' 4"  (1.626 m)   Wt 226 lb 6.4 oz (102.7 kg)   SpO2 94%   BMI 38.86 kg/m , BMI Body mass index is 38.86 kg/m.  Wt Readings from Last 3 Encounters:  06/10/21 226 lb 6.4 oz (102.7 kg)  05/20/21 225 lb (102.1 kg)  05/14/21 227 lb 0.6 oz (103 kg)    General: Patient appears comfortable at rest. Neck: Supple, no elevated JVP or carotid bruits, no thyromegaly. Lungs: Clear to auscultation, nonlabored  breathing at rest. Cardiac: Regular rate and rhythm, no S3 or significant systolic murmur, no pericardial rub. Extremities: No pitting edema, distal pulses 2+. Skin: Warm and dry. Musculoskeletal: No kyphosis. Neuropsychiatric: Alert and oriented x3, affect grossly appropriate.  ECG: 06/10/2021 normal sinus rhythm rate of 61, septal infarct, age undetermined.  Recent Labwork: No results found for requested labs within last 8760 hours.  No results found for: CHOL, TRIG, HDL, CHOLHDL, VLDL, LDLCALC, LDLDIRECT  Other Studies Reviewed Today:  Cardiac monitor 03/18/2021 12 day monitor Rare supraventricular ectopy  in the form of isolated PACs, couplets, triplets. Fourteen episodes of SVT longest 8 beats Rare ventricular ectopy in the form of isolated PVCs, couplets. Four episodes of NSVT longest 15 beats. Reported symptoms correlated with sinus rhythm, PACs, PVCs, and afib Episodes of afib <1% burden, rates were controlled Single nocturnal pause 3.1 seconds    Dobutamine stress test 02/19/2021 [facility] cardiology  Stress Dobutamine (02/19/2021 10:30 AM EDT) Specimen (Source) Anatomical Location / Laterality Collection Method / Volume Collection Time Received Time        02/19/2021 9:48 AM EDT     Imaging Results - Echocardiogram Stress Dobutamine (02/19/2021 10:30 AM EDT) Narrative  02/19/2021 11:32 AM EDT   Patient Info Name:     Rachel Odonnell Age:     64 years DOB:     May 09, 1956 Gender:     Female MRN:     [id number] Accession #:     [id number] Ht:     163 cm Wt:     99 kg BSA:     2.16 m2 BP:     148 /     56 mmHg Technical Quality:     Fair Exam Date:     02/19/2021 9:48 AM Site Location:     [place] Exam Location:     [place] Admit Date:     02/19/2021  Exam Type:     ECHOCARDIOGRAM STRESS DOBUTAMINE  Study Info Indications     - dyspnea, jaw pain  Dobutamine stress echocardiogram is performed.  Staff Referring Physician:     Royann Shivers Ordering Physician:     Royann Shivers Sonographer:     Sinda Du  Account #:     [id number]   Summary  1. Pharmacologic stress echocardiogram does not reveal wall motion abnormality consistent with significant ischemia.  2. Patient symptom of jaw pain was reproduced at peak stress (reason for stress test).  No chest discomfort reported.   Stress Echo Findings Left Ventricle  Normal left ventricular systolic function with no regional wall motion abnormalities noted at rest.  No regional wall motion abnormalities noted post stress.   Left Ventricle  The left ventricle is normal in size with normal wall thickness.  The left ventricular systolic function is normal, LVEF is visually estimated at 60-65%.  Right Ventricle  The right ventricle is normal in size, with normal systolic function.   Left Atrium  The left atrium is mildly dilated in size.  Right Atrium  The right atrium is normal  in size.   Aortic Valve  The aortic valve is trileaflet with mildly thickened leaflets with normal excursion.  There is mild aortic regurgitation.  There is no evidence of a significant transvalvular gradient.  Aortic valve sclerosis without stenosis.  Pulmonary Valve  Pulmonary valve is not well visualized.  Mitral Valve  There is no significant mitral valve regurgitation.  There is no mitral stenosis.  Tricuspid Valve  There is no significant tricuspid regurgitation.   Other Findings  Rhythm: Sinus Rhythm.  Pericardium  There is no pericardial effusion.  Aorta  The aorta is normal in size in the visualized segments.   Protocol:     Dobutamine Rest HR:     61 bpm Peak HR:     144 bpm Rest Sys BP:     148 mmHg Rest Dias BP:     56 mmHg Peak Sys BP:     227 mmHg Peak Dias BP:     68  mmHg Max Pred HR:     155 bpm % Max Pred HR:     93 % Target HR:     132 bpm Max RPP:     32,688 bpm*mmHg Target HR Summary:     Patient's target heart rate was not  achieved due to fatigue BP Response:     Normal blood pressure response Termination Reason:     Reached target heart rate or workload Total Time:     10 min : 25 sec Resting ECG  Normal sinus rhythm. Stress ECG  Borderline ST depression - inferior leads. Arrhythmias  Frequent PACs.   Left Ventricular Outflow Tract ---------------------------------------------------------------------- Name                                 Value        Normal ----------------------------------------------------------------------  LVOT 2D ---------------------------------------------------------------------- LVOT Diameter                       1.8 cm                LVOT Area                          2.5 cm2  Aorta ---------------------------------------------------------------------- Name                                 Value        Normal ----------------------------------------------------------------------  Ascending Aorta ---------------------------------------------------------------------- Asc Ao Diameter                     3.6 cm  Ventricles ---------------------------------------------------------------------- Name                                 Value        Normal ----------------------------------------------------------------------  LV Dimensions 2D/MM ---------------------------------------------------------------------- LVOT Diameter                       1.8 cm   Report Signatures Echo Finalized by Soheil  Assar  DO on 02/19/2021 11:32 AM Stress ECG Finalize by Rayetta Pigg  Assar  DO on 02/19/2021 11:32 AM        03/07/2021  CTA  Chest W Contrast  Anatomical Region Laterality Modality  Chest -- Computed Tomography  Vascular -- --    Impression  Slightly limited examination without evidence of acute pulmonary  embolism through the proximal segmental arteries. Distal segmental  and subsegmental pulmonary arteries are not well assessed on this  examination.   Mild  coronary artery calcification.   Mild paraseptal emphysema.   Mild hepatic steatosis   Aortic Atherosclerosis (ICD10-I70.0) and Emphysema (ICD10-J43.9).       Stress Myoview 03/11/2018 Study Result  Narrative & Impression  There was no ST segment deviation noted during stress. The study is normal. There are no perfusion defects This is a low risk study. The left ventricular ejection fraction is normal (55-65%).      Assessment and Plan:  1. DOE (dyspnea on exertion)   2. Chest pain of uncertain etiology   3. Type 2 diabetes mellitus without complication, without long-term current use of insulin (HCC)   4. History of tobacco abuse   5. Palpitations  1. DOE (dyspnea on exertion) Today she continues to complain continued and progressing DOE/SOB.  She states she had trouble just walking from car to the clinic today.  States she was at [facility] over the weekend and had significant issues with dyspnea on exertion/shortness of breath which seems to be progressing.  She states during that period she had some midsternal chest pain.  We discussed since she continues with progressive shortness of breath and episodes of chest pain in spite of increased anginal therapy it may be reasonable to undergo a right and left heart catheterization.  I spoke to her about risk and benefits of cardiac catheterization.  She fully understands the risk and benefits and is willing to undergo cardiac catheterization.  Also spoke to Dr. Diona Browner who recommended both right and left heart cath if patient is willing to proceed.  Patient is willing to proceed.  She has a right and left heart catheterization scheduled for Friday, 06/13/2021 at 1030 with Dr. [place].  2. Chest pain of uncertain etiology She had a recent dobutamine stress test at [facility] on 03/11/2021 which did not reveal wall motion abnormality consistent with significant ischemia.  Symptom of jaw pain was reproduced at peak  stress.  No chest discomfort.  She had normal left ventricular systolic function with no regional wall motion abnormalities.  No regional wall motion abnormalities noted post-rest.  LVEF was estimated at 60 to 65%.  There was mild aortic regurgitation.  As noted above we discussed continuing shortness of breath and chest pain in spite of increasing antianginal therapy.  Patient would like to proceed with right and left heart cath for definitive evaluation of her shortness of breath and chest pain.  As noted above we discussed risk and benefits of cardiac catheterization and she is willing to proceed.  3. Type 2 diabetes mellitus without complication, without long-term current use of insulin [facility]) Patient states she has been having some issues with controlling her diabetes.  She is taking metformin which causes her a significant amount of diarrhea.  Recent random glucose in the emergency room was 198.  4. History of tobacco abuse History of tobacco abuse.  She used to smoke approximately 25 years and has quit approximately 25 years but states she has been exposed to significant secondhand smoke from taking care of her father who was a heavy smoker for a long time until his death last year.  She has upcoming PFTs with Dr. Vassie Loll.  5.  Palpitations/racing heart Recent ZIO monitor showed rare supraventricular ectopy in the form PACs, couplets, triplets, 14 episodes of SVT longest was 8 beats.  Rare ventricular ectopy therapy in the form of isolated PVCs, couplets.  4 episodes of NSVT longest was 15 beats.  The reported symptoms correlated with sinus rhythm, PACs, PVCs and atrial fibrillation.  Episodes of atrial fibrillation less than 1% burden.  Rates were controlled.  She had 1 single nocturnal pause of 3.1-second.  Medication Adjustments/Labs and Tests Ordered: Current medicines are reviewed at length with the patient today.  Concerns regarding medicines are outlined above.   Disposition: Follow-up with  Dr. Wyline Mood or APP after cardiac catheterization.  Signed, Rennis Harding, NP 06/10/2021 1:49 PM    [facility] [address] [place], [place], [place] [address] Phone: [phone]; Fax: [phone]

## 2021-06-09 NOTE — H&P (View-Only) (Signed)
Cardiology Office Note  Date: 06/10/2021   ID: Rachel Odonnell, Rachel Odonnell 12-30-1955, MRN [id number]  PCP:  Royann Shivers, PA-C  Cardiologist:  None Electrophysiologist:  None   Chief Complaint: Chest pain  History of Present Illness: Rachel Odonnell is a 65 y.o. female with a history of DM2, HTN   Recently saw Dr. Vassie Loll on 05/14/2021.  He mentioned she seemed to have severe OSA maintained on CPAP.  She had good results with improvement in daytime somnolence and fatigue.  Plans were to obtain PFTs to clarify but Dr. Vassie Loll doubted this was the main issue or reason for her dyspnea.  He mentioned she complained of jaw pain and if jaw pain persists to call cardiology.  She was last here to follow-up on ZIO monitor.  We discussed the results of ZIO monitor.  She recently had 2 episodes of chest pain while walking at [facility].  He had not been totally compliant with taking her Imdur.  She stated she sometimes forgot.  She is wondering if the chest pain may have had something to do with her missing a dose or 2 of the medication to take it.  We talked about her recent dobutamine stress test which was reassuring.  She had upcoming PFTs with Dr. Vassie Loll.  She had been feeling fatigued since stopping her Adderall for narcolepsy.  She does have sleep apnea which has been managed by her PCP per her statement.  I advised her to speak with Dr. Vassie Loll regarding increased fatigue and sleepiness.  He mentioned maybe this could be related to the fact she may need CPAP titration.  Otherwise she denied any issues.   Imdur was increased at last visit to 60 mg.  Patient called on 06/04/2021 complaining of shortness of breath and having some "twinges" in her heart area, fatigue, high blood pressure, headache, dizziness, can barely get out of bed after increasing Imdur.  She described this as occurring over the prior 3 weeks.  She reported walking with sensation of aching chest pain.  She rated it 3 out of 10 lasting 5  minutes and stops after resting.  Also reported some accompanying shortness of breath.   She is here today for follow-up with continued complaints of increased shortness of breath.  She states it feels like it is getting worse.  She states she was recently in [facility] and was carrying light bags to her cabin when she started having significant shortness of breath/DOE with some midsternal chest pain which was relieved with rest.  She states she becomes significantly winded when she was exiting her car and entering the clinic today.  She states no one has contacted her regarding PFTs pulmonology had ordered for her.  We discussed possible cardiac catheterization as a next step.  We discussed the process and the risk and benefits.  She is willing to proceed stating she just wants to find out the real issue behind her shortness of breath and chest pain.  She states she could not tolerate the increased dose of Imdur and went back down to 30 but yesterday started the Imdur 60 mg of back again.  EKG today shows normal sinus rhythm rate of 61, septal infarct, age undetermined.  Past Medical History:  Diagnosis Date   ADD (attention deficit disorder)    Diabetes mellitus without complication (HCC)    Hypertension     Past Surgical History:  Procedure Laterality Date   INCONTINENCE SURGERY  Current Outpatient Medications  Medication Sig Dispense Refill   aspirin EC 81 MG tablet Take 81 mg by mouth daily.     citalopram (CELEXA) 40 MG tablet Take 40 mg by mouth daily.     diclofenac (VOLTAREN) 75 MG EC tablet Take 1 tablet by mouth 2 (two) times daily.     estradiol (ESTRACE) 2 MG tablet Take 2 mg by mouth daily.     EUTHYROX 25 MCG tablet Take 1 tablet by mouth daily.     fenofibrate (TRICOR) 145 MG tablet Take 145 mg by mouth daily.     fluorometholone (FML) 0.1 % ophthalmic suspension as needed.     Insulin Glargine (TOUJEO SOLOSTAR [place]) Inject into the skin once a week.      isosorbide mononitrate (IMDUR) 60 MG 24 hr tablet Take 1 tablet (60 mg total) by mouth daily. (Patient taking differently: Take 30 mg by mouth daily.) 90 tablet 3   levocetirizine (XYZAL) 5 MG tablet Take 5 mg by mouth every evening.     metFORMIN (GLUCOPHAGE) 500 MG tablet Take 1,000 mg by mouth 2 (two) times daily with a meal.     montelukast (SINGULAIR) 10 MG tablet Take 10 mg by mouth at bedtime.     Omega-3 Fatty Acids (FISH OIL) 1000 MG CAPS Take 1 capsule by mouth daily.     omeprazole (PRILOSEC) 40 MG capsule Take 40 mg by mouth daily.     rOPINIRole (REQUIP) 2 MG tablet Take 2 mg by mouth 2 (two) times daily.     rosuvastatin (CRESTOR) 10 MG tablet Take 1 tablet by mouth daily.     No current facility-administered medications for this visit.   Allergies:  Azithromycin, Codeine, Atomoxetine, Atorvastatin, and Lisinopril   Social History: The patient  reports that she quit smoking about 26 years ago. Her smoking use included cigarettes. She smoked an average of 2 packs per day. She has never used smokeless tobacco. She reports that she does not drink alcohol and does not use drugs.   Family History: The patient's family history includes COPD in her father; Congestive Heart Failure in her father; Emphysema in her father; Heart Problems in her father; Heart block in her father.   ROS:  Please see the history of present illness. Otherwise, complete review of systems is positive for none.  All other systems are reviewed and negative.   Physical Exam: VS:  BP 134/72   Pulse 63   Ht 5\' 4"  (1.626 m)   Wt 226 lb 6.4 oz (102.7 kg)   SpO2 94%   BMI 38.86 kg/m , BMI Body mass index is 38.86 kg/m.  Wt Readings from Last 3 Encounters:  06/10/21 226 lb 6.4 oz (102.7 kg)  05/20/21 225 lb (102.1 kg)  05/14/21 227 lb 0.6 oz (103 kg)    General: Patient appears comfortable at rest. Neck: Supple, no elevated JVP or carotid bruits, no thyromegaly. Lungs: Clear to auscultation, nonlabored  breathing at rest. Cardiac: Regular rate and rhythm, no S3 or significant systolic murmur, no pericardial rub. Extremities: No pitting edema, distal pulses 2+. Skin: Warm and dry. Musculoskeletal: No kyphosis. Neuropsychiatric: Alert and oriented x3, affect grossly appropriate.  ECG: 06/10/2021 normal sinus rhythm rate of 61, septal infarct, age undetermined.  Recent Labwork: No results found for requested labs within last 8760 hours.  No results found for: CHOL, TRIG, HDL, CHOLHDL, VLDL, LDLCALC, LDLDIRECT  Other Studies Reviewed Today:  Cardiac monitor 03/18/2021 12 day monitor Rare supraventricular ectopy  in the form of isolated PACs, couplets, triplets. Fourteen episodes of SVT longest 8 beats Rare ventricular ectopy in the form of isolated PVCs, couplets. Four episodes of NSVT longest 15 beats. Reported symptoms correlated with sinus rhythm, PACs, PVCs, and afib Episodes of afib <1% burden, rates were controlled Single nocturnal pause 3.1 seconds    Dobutamine stress test 02/19/2021 [facility] cardiology  Stress Dobutamine (02/19/2021 10:30 AM EDT) Specimen (Source) Anatomical Location / Laterality Collection Method / Volume Collection Time Received Time        02/19/2021 9:48 AM EDT     Imaging Results - Echocardiogram Stress Dobutamine (02/19/2021 10:30 AM EDT) Narrative  02/19/2021 11:32 AM EDT   Patient Info Name:     Rachel Odonnell Age:     64 years DOB:     May 09, 1956 Gender:     Female MRN:     [id number] Accession #:     [id number] Ht:     163 cm Wt:     99 kg BSA:     2.16 m2 BP:     148 /     56 mmHg Technical Quality:     Fair Exam Date:     02/19/2021 9:48 AM Site Location:     [place] Exam Location:     [place] Admit Date:     02/19/2021  Exam Type:     ECHOCARDIOGRAM STRESS DOBUTAMINE  Study Info Indications     - dyspnea, jaw pain  Dobutamine stress echocardiogram is performed.  Staff Referring Physician:     Royann Shivers Ordering Physician:     Royann Shivers Sonographer:     Sinda Du  Account #:     [id number]   Summary  1. Pharmacologic stress echocardiogram does not reveal wall motion abnormality consistent with significant ischemia.  2. Patient symptom of jaw pain was reproduced at peak stress (reason for stress test).  No chest discomfort reported.   Stress Echo Findings Left Ventricle  Normal left ventricular systolic function with no regional wall motion abnormalities noted at rest.  No regional wall motion abnormalities noted post stress.   Left Ventricle  The left ventricle is normal in size with normal wall thickness.  The left ventricular systolic function is normal, LVEF is visually estimated at 60-65%.  Right Ventricle  The right ventricle is normal in size, with normal systolic function.   Left Atrium  The left atrium is mildly dilated in size.  Right Atrium  The right atrium is normal  in size.   Aortic Valve  The aortic valve is trileaflet with mildly thickened leaflets with normal excursion.  There is mild aortic regurgitation.  There is no evidence of a significant transvalvular gradient.  Aortic valve sclerosis without stenosis.  Pulmonary Valve  Pulmonary valve is not well visualized.  Mitral Valve  There is no significant mitral valve regurgitation.  There is no mitral stenosis.  Tricuspid Valve  There is no significant tricuspid regurgitation.   Other Findings  Rhythm: Sinus Rhythm.  Pericardium  There is no pericardial effusion.  Aorta  The aorta is normal in size in the visualized segments.   Protocol:     Dobutamine Rest HR:     61 bpm Peak HR:     144 bpm Rest Sys BP:     148 mmHg Rest Dias BP:     56 mmHg Peak Sys BP:     227 mmHg Peak Dias BP:     68  mmHg Max Pred HR:     155 bpm % Max Pred HR:     93 % Target HR:     132 bpm Max RPP:     32,688 bpm*mmHg Target HR Summary:     Patient's target heart rate was not  achieved due to fatigue BP Response:     Normal blood pressure response Termination Reason:     Reached target heart rate or workload Total Time:     10 min : 25 sec Resting ECG  Normal sinus rhythm. Stress ECG  Borderline ST depression - inferior leads. Arrhythmias  Frequent PACs.   Left Ventricular Outflow Tract ---------------------------------------------------------------------- Name                                 Value        Normal ----------------------------------------------------------------------  LVOT 2D ---------------------------------------------------------------------- LVOT Diameter                       1.8 cm                LVOT Area                          2.5 cm2  Aorta ---------------------------------------------------------------------- Name                                 Value        Normal ----------------------------------------------------------------------  Ascending Aorta ---------------------------------------------------------------------- Asc Ao Diameter                     3.6 cm  Ventricles ---------------------------------------------------------------------- Name                                 Value        Normal ----------------------------------------------------------------------  LV Dimensions 2D/MM ---------------------------------------------------------------------- LVOT Diameter                       1.8 cm   Report Signatures Echo Finalized by Soheil  Assar  DO on 02/19/2021 11:32 AM Stress ECG Finalize by Soheil  Assar  DO on 02/19/2021 11:32 AM        03/07/2021  CTA  Chest W Contrast  Anatomical Region Laterality Modality  Chest -- Computed Tomography  Vascular -- --    Impression  Slightly limited examination without evidence of acute pulmonary  embolism through the proximal segmental arteries. Distal segmental  and subsegmental pulmonary arteries are not well assessed on this  examination.   Mild  coronary artery calcification.   Mild paraseptal emphysema.   Mild hepatic steatosis   Aortic Atherosclerosis (ICD10-I70.0) and Emphysema (ICD10-J43.9).       Stress Myoview 03/11/2018 Study Result  Narrative & Impression  There was no ST segment deviation noted during stress. The study is normal. There are no perfusion defects This is a low risk study. The left ventricular ejection fraction is normal (55-65%).      Assessment and Plan:  1. DOE (dyspnea on exertion)   2. Chest pain of uncertain etiology   3. Type 2 diabetes mellitus without complication, without long-term current use of insulin (HCC)   4. History of tobacco abuse   5. Palpitations        1. DOE (dyspnea on exertion) Today she continues to complain continued and progressing DOE/SOB.  She states she had trouble just walking from car to the clinic today.  States she was at [facility] over the weekend and had significant issues with dyspnea on exertion/shortness of breath which seems to be progressing.  She states during that period she had some midsternal chest pain.  We discussed since she continues with progressive shortness of breath and episodes of chest pain in spite of increased anginal therapy it may be reasonable to undergo a right and left heart catheterization.  I spoke to her about risk and benefits of cardiac catheterization.  She fully understands the risk and benefits and is willing to undergo cardiac catheterization.  Also spoke to Dr. Diona Browner who recommended both right and left heart cath if patient is willing to proceed.  Patient is willing to proceed.  She has a right and left heart catheterization scheduled for Friday, 06/13/2021 at 1030 with Dr. [place].  2. Chest pain of uncertain etiology She had a recent dobutamine stress test at [facility] on 03/11/2021 which did not reveal wall motion abnormality consistent with significant ischemia.  Symptom of jaw pain was reproduced at peak  stress.  No chest discomfort.  She had normal left ventricular systolic function with no regional wall motion abnormalities.  No regional wall motion abnormalities noted post-rest.  LVEF was estimated at 60 to 65%.  There was mild aortic regurgitation.  As noted above we discussed continuing shortness of breath and chest pain in spite of increasing antianginal therapy.  Patient would like to proceed with right and left heart cath for definitive evaluation of her shortness of breath and chest pain.  As noted above we discussed risk and benefits of cardiac catheterization and she is willing to proceed.  3. Type 2 diabetes mellitus without complication, without long-term current use of insulin [facility]) Patient states she has been having some issues with controlling her diabetes.  She is taking metformin which causes her a significant amount of diarrhea.  Recent random glucose in the emergency room was 198.  4. History of tobacco abuse History of tobacco abuse.  She used to smoke approximately 25 years and has quit approximately 25 years but states she has been exposed to significant secondhand smoke from taking care of her father who was a heavy smoker for a long time until his death last year.  She has upcoming PFTs with Dr. Vassie Loll.  5.  Palpitations/racing heart Recent ZIO monitor showed rare supraventricular ectopy in the form PACs, couplets, triplets, 14 episodes of SVT longest was 8 beats.  Rare ventricular ectopy therapy in the form of isolated PVCs, couplets.  4 episodes of NSVT longest was 15 beats.  The reported symptoms correlated with sinus rhythm, PACs, PVCs and atrial fibrillation.  Episodes of atrial fibrillation less than 1% burden.  Rates were controlled.  She had 1 single nocturnal pause of 3.1-second.  Medication Adjustments/Labs and Tests Ordered: Current medicines are reviewed at length with the patient today.  Concerns regarding medicines are outlined above.   Disposition: Follow-up with  Dr. Wyline Mood or APP after cardiac catheterization.  Signed, Rennis Harding, NP 06/10/2021 1:49 PM    [facility] [address] [place], [place], [place] [address] Phone: [phone]; Fax: [phone]

## 2021-06-10 ENCOUNTER — Encounter: Payer: Self-pay | Admitting: *Deleted

## 2021-06-10 ENCOUNTER — Telehealth: Payer: Self-pay | Admitting: Family Medicine

## 2021-06-10 ENCOUNTER — Ambulatory Visit (INDEPENDENT_AMBULATORY_CARE_PROVIDER_SITE_OTHER): Payer: Medicare HMO | Admitting: Family Medicine

## 2021-06-10 ENCOUNTER — Encounter: Payer: Self-pay | Admitting: Family Medicine

## 2021-06-10 VITALS — BP 134/72 | HR 63 | Ht 64.0 in | Wt 226.4 lb

## 2021-06-10 DIAGNOSIS — Z01812 Encounter for preprocedural laboratory examination: Secondary | ICD-10-CM

## 2021-06-10 DIAGNOSIS — Z87891 Personal history of nicotine dependence: Secondary | ICD-10-CM

## 2021-06-10 DIAGNOSIS — E119 Type 2 diabetes mellitus without complications: Secondary | ICD-10-CM | POA: Diagnosis not present

## 2021-06-10 DIAGNOSIS — G4733 Obstructive sleep apnea (adult) (pediatric): Secondary | ICD-10-CM | POA: Diagnosis not present

## 2021-06-10 DIAGNOSIS — R079 Chest pain, unspecified: Secondary | ICD-10-CM

## 2021-06-10 DIAGNOSIS — Z01818 Encounter for other preprocedural examination: Secondary | ICD-10-CM

## 2021-06-10 DIAGNOSIS — R0609 Other forms of dyspnea: Secondary | ICD-10-CM | POA: Diagnosis not present

## 2021-06-10 DIAGNOSIS — R002 Palpitations: Secondary | ICD-10-CM

## 2021-06-10 NOTE — Patient Instructions (Addendum)
Medication Instructions:  Continue all current medications.  Labwork: CBC, BMET - orders given today.  Testing/Procedures: Your physician has requested that you have a cardiac catheterization. Cardiac catheterization is used to diagnose and/or treat various heart conditions. Doctors may recommend this procedure for a number of different reasons. The most common reason is to evaluate chest pain. Chest pain can be a symptom of coronary artery disease (CAD), and cardiac catheterization can show whether plaque is narrowing or blocking your heart's arteries. This procedure is also used to evaluate the valves, as well as measure the blood flow and oxygen levels in different parts of your heart. For further information please visit www.cardiosmart.org. Please follow instruction sheet, as given.  Follow-Up: 1 month   Any Other Special Instructions Will Be Listed Below (If Applicable).  If you need a refill on your cardiac medications before your next appointment, please call your pharmacy.  

## 2021-06-10 NOTE — Telephone Encounter (Signed)
PERCERT:   right & left heart cath - sob, cp - Friday, 10/28 - [place] - 10:30

## 2021-06-11 ENCOUNTER — Other Ambulatory Visit: Payer: Self-pay

## 2021-06-11 ENCOUNTER — Other Ambulatory Visit (HOSPITAL_COMMUNITY)
Admission: RE | Admit: 2021-06-11 | Discharge: 2021-06-11 | Disposition: A | Discharge status: Home / Self Care | Payer: Medicare HMO | Source: Ambulatory Visit | Attending: Family Medicine | Admitting: Family Medicine

## 2021-06-11 DIAGNOSIS — Z01812 Encounter for preprocedural laboratory examination: Secondary | ICD-10-CM | POA: Diagnosis not present

## 2021-06-11 DIAGNOSIS — R0609 Other forms of dyspnea: Secondary | ICD-10-CM | POA: Diagnosis not present

## 2021-06-11 DIAGNOSIS — R079 Chest pain, unspecified: Secondary | ICD-10-CM | POA: Diagnosis not present

## 2021-06-11 LAB — CBC
HCT: 42 % (ref 36.0–46.0)
Hemoglobin: 13.9 g/dL (ref 12.0–15.0)
MCH: 32.5 pg (ref 26.0–34.0)
MCHC: 33.1 g/dL (ref 30.0–36.0)
MCV: 98.1 fL (ref 80.0–100.0)
Platelets: 267 10*3/uL (ref 150–400)
RBC: 4.28 MIL/uL (ref 3.87–5.11)
RDW: 12.5 % (ref 11.5–15.5)
WBC: 7.3 10*3/uL (ref 4.0–10.5)
nRBC: 0 % (ref 0.0–0.2)

## 2021-06-11 LAB — BASIC METABOLIC PANEL
Anion gap: 8 (ref 5–15)
BUN: 19 mg/dL (ref 8–23)
CO2: 25 mmol/L (ref 22–32)
Calcium: 9.9 mg/dL (ref 8.9–10.3)
Chloride: 99 mmol/L (ref 98–111)
Creatinine, Ser: 0.88 mg/dL (ref 0.44–1.00)
GFR, Estimated: >60 mL/min (ref >60)
Glucose, Bld: 338 mg/dL — ABNORMAL HIGH (ref 70–99)
Potassium: 4.3 mmol/L (ref 3.5–5.1)
Sodium: 132 mmol/L — ABNORMAL LOW (ref 135–145)

## 2021-06-11 NOTE — Progress Notes (Signed)
VM left for RT regarding patients PFT to be scheduled.

## 2021-06-11 NOTE — Telephone Encounter (Signed)
Called RT and LMTCB regarding PFT scheduling. Patient has been notified. Informed patient that Rachel Odonnell does have a long wait for PFTs at the moment and offered her a [place] PFT appt but pt declined and said she'd rather wait for the appt to be in [place]. Nothing further needed at this time.

## 2021-06-11 NOTE — Telephone Encounter (Signed)
-----   Message from Oretha Milch, MD sent at 06/10/2021  9:13 PM EDT ----- Thanks for letting me know Rachel Odonnell , please schedule pFTs for her ----- Message ----- From: Netta Neat., NP Sent: 06/10/2021   3:09 PM EDT To: Oretha Milch, MD, Antoine Poche, MD  Dr. Vassie Loll.  Just wanted to send this note to you.  Patient states she has not heard word about scheduling her PFTs yet.  She continues to complain of progressive shortness of breath.  I spoke to Dr. Diona Browner regarding cardiac catheterization.  Patient wants cardiac catheterization given progressive shortness of breath and continuing chest pain in spite of increasing antianginal therapy.  Just wanted you to know she had questions about no one contacting her about scheduling PFTs.  Thank you

## 2021-06-12 ENCOUNTER — Telehealth: Payer: Self-pay | Admitting: *Deleted

## 2021-06-12 ENCOUNTER — Telehealth: Payer: Self-pay | Admitting: Cardiology

## 2021-06-12 DIAGNOSIS — S233XXA Sprain of ligaments of thoracic spine, initial encounter: Secondary | ICD-10-CM | POA: Diagnosis not present

## 2021-06-12 DIAGNOSIS — M9901 Segmental and somatic dysfunction of cervical region: Secondary | ICD-10-CM | POA: Diagnosis not present

## 2021-06-12 DIAGNOSIS — S134XXA Sprain of ligaments of cervical spine, initial encounter: Secondary | ICD-10-CM | POA: Diagnosis not present

## 2021-06-12 DIAGNOSIS — M9902 Segmental and somatic dysfunction of thoracic region: Secondary | ICD-10-CM | POA: Diagnosis not present

## 2021-06-12 DIAGNOSIS — M9903 Segmental and somatic dysfunction of lumbar region: Secondary | ICD-10-CM | POA: Diagnosis not present

## 2021-06-12 DIAGNOSIS — S338XXA Sprain of other parts of lumbar spine and pelvis, initial encounter: Secondary | ICD-10-CM | POA: Diagnosis not present

## 2021-06-12 NOTE — Telephone Encounter (Signed)
Reviewed procedure/mask/visitor instructions with patient. 

## 2021-06-12 NOTE — Telephone Encounter (Signed)
Patient is having a heart catherization done tomorrow and she wanted to know about if she needs to take off finger and toe nail polish.

## 2021-06-12 NOTE — Telephone Encounter (Signed)
No answer, voicemail message. 

## 2021-06-12 NOTE — Telephone Encounter (Addendum)
Cardiac catheterization scheduled at [facility] for: Friday June 13, 2021 10:30 AM [facility] Main Entrance A [facility]) at: 8:30 AM   No solid food after midnight prior to cath, clear liquids until 5 AM day of procedure.  Medication instructions: Hold: Metformin-day of procedure and 48 hours post procedure  Except hold medications usual morning medications can be taken pre-cath with sips of water including aspirin 81 mg.    Confirmed patient has responsible adult to drive home post procedure and be with patient first 24 hours after arriving home.  [facility] does allow one visitor to accompany you and wait in the hospital waiting room while you are there for your procedure. You and your visitor will be asked to wear a mask once you enter the hospital.   Patient reports does not currently have any new symptoms concerning for COVID-19 and no household members with COVID-19 like illness.    Reviewed procedure/mask/visitor instructions with patient.

## 2021-06-12 NOTE — Telephone Encounter (Signed)
Patient is aware she does not need to remove finger/toe nail polish for cardiac cath tomorrow.

## 2021-06-13 ENCOUNTER — Ambulatory Visit (HOSPITAL_COMMUNITY)
Admission: RE | Admit: 2021-06-13 | Discharge: 2021-06-13 | Disposition: A | Discharge status: Home / Self Care | Payer: Medicare HMO | Attending: Cardiology | Admitting: Cardiology

## 2021-06-13 ENCOUNTER — Encounter (HOSPITAL_COMMUNITY): Payer: Self-pay | Admitting: Cardiology

## 2021-06-13 ENCOUNTER — Encounter (HOSPITAL_COMMUNITY)
Admission: RE | Disposition: A | Discharge status: Home / Self Care | Payer: Self-pay | Source: Home / Self Care | Attending: Cardiology

## 2021-06-13 ENCOUNTER — Telehealth: Payer: Self-pay | Admitting: Cardiology

## 2021-06-13 DIAGNOSIS — Z7982 Long term (current) use of aspirin: Secondary | ICD-10-CM | POA: Insufficient documentation

## 2021-06-13 DIAGNOSIS — R002 Palpitations: Secondary | ICD-10-CM | POA: Diagnosis not present

## 2021-06-13 DIAGNOSIS — I25119 Atherosclerotic heart disease of native coronary artery with unspecified angina pectoris: Secondary | ICD-10-CM | POA: Insufficient documentation

## 2021-06-13 DIAGNOSIS — Z87891 Personal history of nicotine dependence: Secondary | ICD-10-CM | POA: Insufficient documentation

## 2021-06-13 DIAGNOSIS — Z79899 Other long term (current) drug therapy: Secondary | ICD-10-CM | POA: Insufficient documentation

## 2021-06-13 DIAGNOSIS — R6884 Jaw pain: Secondary | ICD-10-CM | POA: Insufficient documentation

## 2021-06-13 DIAGNOSIS — E1159 Type 2 diabetes mellitus with other circulatory complications: Secondary | ICD-10-CM

## 2021-06-13 DIAGNOSIS — I272 Pulmonary hypertension, unspecified: Secondary | ICD-10-CM | POA: Diagnosis not present

## 2021-06-13 DIAGNOSIS — G4733 Obstructive sleep apnea (adult) (pediatric): Secondary | ICD-10-CM | POA: Diagnosis not present

## 2021-06-13 DIAGNOSIS — I10 Essential (primary) hypertension: Secondary | ICD-10-CM | POA: Diagnosis present

## 2021-06-13 DIAGNOSIS — Z794 Long term (current) use of insulin: Secondary | ICD-10-CM | POA: Insufficient documentation

## 2021-06-13 DIAGNOSIS — Z7984 Long term (current) use of oral hypoglycemic drugs: Secondary | ICD-10-CM | POA: Diagnosis not present

## 2021-06-13 DIAGNOSIS — E1165 Type 2 diabetes mellitus with hyperglycemia: Secondary | ICD-10-CM

## 2021-06-13 DIAGNOSIS — I2510 Atherosclerotic heart disease of native coronary artery without angina pectoris: Secondary | ICD-10-CM

## 2021-06-13 DIAGNOSIS — I200 Unstable angina: Secondary | ICD-10-CM | POA: Diagnosis present

## 2021-06-13 DIAGNOSIS — R0609 Other forms of dyspnea: Secondary | ICD-10-CM | POA: Diagnosis not present

## 2021-06-13 DIAGNOSIS — E119 Type 2 diabetes mellitus without complications: Secondary | ICD-10-CM | POA: Insufficient documentation

## 2021-06-13 DIAGNOSIS — R0602 Shortness of breath: Secondary | ICD-10-CM | POA: Diagnosis present

## 2021-06-13 DIAGNOSIS — E663 Overweight: Secondary | ICD-10-CM | POA: Diagnosis present

## 2021-06-13 HISTORY — PX: RIGHT/LEFT HEART CATH AND CORONARY ANGIOGRAPHY: CATH118266

## 2021-06-13 LAB — POCT I-STAT EG7
Acid-Base Excess: 1 mmol/L (ref 0.0–2.0)
Acid-Base Excess: 2 mmol/L (ref 0.0–2.0)
Bicarbonate: 27.9 mmol/L (ref 20.0–28.0)
Bicarbonate: 28.9 mmol/L — ABNORMAL HIGH (ref 20.0–28.0)
Calcium, Ion: 1.34 mmol/L (ref 1.15–1.40)
Calcium, Ion: 1.34 mmol/L (ref 1.15–1.40)
HCT: 37 % (ref 36.0–46.0)
HCT: 37 % (ref 36.0–46.0)
Hemoglobin: 12.6 g/dL (ref 12.0–15.0)
Hemoglobin: 12.6 g/dL (ref 12.0–15.0)
O2 Saturation: 67 %
O2 Saturation: 69 %
Potassium: 4.3 mmol/L (ref 3.5–5.1)
Potassium: 4.3 mmol/L (ref 3.5–5.1)
Sodium: 135 mmol/L (ref 135–145)
Sodium: 137 mmol/L (ref 135–145)
TCO2: 30 mmol/L (ref 22–32)
TCO2: 31 mmol/L (ref 22–32)
pCO2, Ven: 55.8 mmHg (ref 44.0–60.0)
pCO2, Ven: 56.5 mmHg (ref 44.0–60.0)
pH, Ven: 7.307 (ref 7.250–7.430)
pH, Ven: 7.317 (ref 7.250–7.430)
pO2, Ven: 39 mmHg (ref 32.0–45.0)
pO2, Ven: 40 mmHg (ref 32.0–45.0)

## 2021-06-13 LAB — GLUCOSE, CAPILLARY: Glucose-Capillary: 309 mg/dL — ABNORMAL HIGH (ref 70–99)

## 2021-06-13 LAB — POCT I-STAT 7, (LYTES, BLD GAS, ICA,H+H)
Acid-Base Excess: 2 mmol/L (ref 0.0–2.0)
Bicarbonate: 28.2 mmol/L — ABNORMAL HIGH (ref 20.0–28.0)
Calcium, Ion: 1.3 mmol/L (ref 1.15–1.40)
HCT: 36 % (ref 36.0–46.0)
Hemoglobin: 12.2 g/dL (ref 12.0–15.0)
O2 Saturation: 99 %
Potassium: 4.3 mmol/L (ref 3.5–5.1)
Sodium: 136 mmol/L (ref 135–145)
TCO2: 30 mmol/L (ref 22–32)
pCO2 arterial: 49.9 mmHg — ABNORMAL HIGH (ref 32.0–48.0)
pH, Arterial: 7.36 (ref 7.350–7.450)
pO2, Arterial: 144 mmHg — ABNORMAL HIGH (ref 83.0–108.0)

## 2021-06-13 SURGERY — RIGHT/LEFT HEART CATH AND CORONARY ANGIOGRAPHY
Anesthesia: LOCAL

## 2021-06-13 MED ORDER — SODIUM CHLORIDE 0.9% FLUSH: 3.0000 mL | INTRAVENOUS | Status: DC | PRN

## 2021-06-13 MED ORDER — IOHEXOL 350 MG/ML SOLN
INTRAVENOUS | Status: DC | PRN
  Administered 2021-06-13: 55 mL

## 2021-06-13 MED ORDER — LIDOCAINE HCL (PF) 1 % IJ SOLN
INTRAMUSCULAR | Status: AC
  Filled 2021-06-13: qty 30

## 2021-06-13 MED ORDER — HYDRALAZINE HCL 20 MG/ML IJ SOLN: 10.0000 mg | INTRAMUSCULAR | Status: DC | PRN

## 2021-06-13 MED ORDER — SODIUM CHLORIDE 0.9 % WEIGHT BASED INFUSION: 1.0000 mL/kg/h | INTRAVENOUS | Status: DC

## 2021-06-13 MED ORDER — FENTANYL CITRATE (PF) 100 MCG/2ML IJ SOLN
INTRAMUSCULAR | Status: DC | PRN
  Administered 2021-06-13: 25 ug via INTRAVENOUS

## 2021-06-13 MED ORDER — ASPIRIN 81 MG PO CHEW: 81.0000 mg | CHEWABLE_TABLET | ORAL | Status: DC

## 2021-06-13 MED ORDER — SODIUM CHLORIDE 0.9 % WEIGHT BASED INFUSION
3.0000 mL/kg/h | INTRAVENOUS | Status: AC
  Administered 2021-06-13: 3 mL/kg/h via INTRAVENOUS

## 2021-06-13 MED ORDER — METFORMIN HCL 500 MG PO TABS: 1000.0000 mg | ORAL_TABLET | Freq: Two times a day (BID) | ORAL | Status: DC

## 2021-06-13 MED ORDER — MIDAZOLAM HCL 2 MG/2ML IJ SOLN
INTRAMUSCULAR | Status: DC | PRN
  Administered 2021-06-13: 2 mg via INTRAVENOUS

## 2021-06-13 MED ORDER — FENTANYL CITRATE (PF) 100 MCG/2ML IJ SOLN
INTRAMUSCULAR | Status: AC
  Filled 2021-06-13: qty 2

## 2021-06-13 MED ORDER — SODIUM CHLORIDE 0.9% FLUSH: 3.0000 mL | Freq: Two times a day (BID) | INTRAVENOUS | Status: DC

## 2021-06-13 MED ORDER — MIDAZOLAM HCL 2 MG/2ML IJ SOLN
INTRAMUSCULAR | Status: AC
  Filled 2021-06-13: qty 2

## 2021-06-13 MED ORDER — SODIUM CHLORIDE 0.9 % IV SOLN: 250.0000 mL | INTRAVENOUS | Status: DC | PRN

## 2021-06-13 MED ORDER — HEPARIN SODIUM (PORCINE) 1000 UNIT/ML IJ SOLN
INTRAMUSCULAR | Status: AC
  Filled 2021-06-13: qty 1

## 2021-06-13 MED ORDER — VERAPAMIL HCL 2.5 MG/ML IV SOLN
INTRAVENOUS | Status: DC | PRN
  Administered 2021-06-13: 10 mL via INTRA_ARTERIAL

## 2021-06-13 MED ORDER — HEPARIN (PORCINE) IN NACL 1000-0.9 UT/500ML-% IV SOLN
INTRAVENOUS | Status: DC | PRN
  Administered 2021-06-13 (×2): 500 mL

## 2021-06-13 MED ORDER — ONDANSETRON HCL 4 MG/2ML IJ SOLN: 4.0000 mg | Freq: Four times a day (QID) | INTRAMUSCULAR | Status: DC | PRN

## 2021-06-13 MED ORDER — LIDOCAINE HCL (PF) 1 % IJ SOLN
INTRAMUSCULAR | Status: DC | PRN
  Administered 2021-06-13 (×2): 2 mL

## 2021-06-13 MED ORDER — VERAPAMIL HCL 2.5 MG/ML IV SOLN
INTRAVENOUS | Status: AC
  Filled 2021-06-13: qty 2

## 2021-06-13 MED ORDER — ACETAMINOPHEN 325 MG PO TABS: 650.0000 mg | ORAL_TABLET | ORAL | Status: DC | PRN

## 2021-06-13 MED ORDER — HEPARIN SODIUM (PORCINE) 1000 UNIT/ML IJ SOLN
INTRAMUSCULAR | Status: DC | PRN
  Administered 2021-06-13: 5000 [IU] via INTRAVENOUS

## 2021-06-13 MED ORDER — HEPARIN (PORCINE) IN NACL 1000-0.9 UT/500ML-% IV SOLN
INTRAVENOUS | Status: AC
  Filled 2021-06-13: qty 1000

## 2021-06-13 SURGICAL SUPPLY — 12 items
CATH 5FR JL3.5 JR4 ANG PIG MP (CATHETERS) ×1 IMPLANT
CATH BALLN WEDGE 5F 110CM (CATHETERS) ×1 IMPLANT
CATH INFINITI 5 FR JL3.5 (CATHETERS) ×1 IMPLANT
DEVICE RAD COMP TR BAND LRG (VASCULAR PRODUCTS) ×2 IMPLANT
GLIDESHEATH SLEND SS 6F .021 (SHEATH) ×1 IMPLANT
GUIDEWIRE INQWIRE 1.5J.035X260 (WIRE) IMPLANT
INQWIRE 1.5J .035X260CM (WIRE) ×2
KIT HEART LEFT (KITS) ×2 IMPLANT
PACK CARDIAC CATHETERIZATION (CUSTOM PROCEDURE TRAY) ×2 IMPLANT
SHEATH GLIDE SLENDER 4/5FR (SHEATH) ×1 IMPLANT
TRANSDUCER W/STOPCOCK (MISCELLANEOUS) ×2 IMPLANT
TUBING CIL FLEX 10 FLL-RA (TUBING) ×2 IMPLANT

## 2021-06-13 NOTE — Telephone Encounter (Signed)
Spoke to patient she stated she had a cath this morning and is doing well.She was told her cad would be treated medically.She wanted to know if she needs any new medications.Advised she should continue her current medications,Rosuvastatin 10 mg daily,Isosorbide 60 mg daily.She wanted to know if she would be referred to a pulmonary Dr.Advised to keep appointment already scheduled with Rennis Harding PA 11/21 at 2:30 pm at [facility] office and he would make that referral.

## 2021-06-13 NOTE — Interval H&P Note (Signed)
History and Physical Interval Note:  06/13/2021 10:05 AM  Melton Krebs  has presented today for surgery, with the diagnosis of shortness of breath.  The various methods of treatment have been discussed with the patient and family. After consideration of risks, benefits and other options for treatment, the patient has consented to  Procedure(s): RIGHT/LEFT HEART CATH AND CORONARY ANGIOGRAPHY (N/A) as a surgical intervention.  The patient's history has been reviewed, patient examined, no change in status, stable for surgery.  I have reviewed the patient's chart and labs.  Questions were answered to the patient's satisfaction.    Cath Lab Visit (complete for each Cath Lab visit)  Clinical Evaluation Leading to the Procedure:   ACS: Yes.    Non-ACS:    Anginal Classification: CCS III  Anti-ischemic medical therapy: Minimal Therapy (1 class of medications)  Non-Invasive Test Results: Low-risk stress test findings: cardiac mortality <1%/year  Prior CABG: No previous CABG       Theron Arista [facility] 06/13/2021 10:06 AM

## 2021-06-13 NOTE — Telephone Encounter (Signed)
Pt had a Cath this morning and was advised by Dr. [place] that he was going to give her some new bp meds but pt is not sure what meds it will be. Please advise pt further

## 2021-06-20 ENCOUNTER — Telehealth: Payer: Self-pay | Admitting: Pulmonary Disease

## 2021-06-20 NOTE — Telephone Encounter (Signed)
Spoke to RT dept. Scheduled pt for 07/29/21 1:00 with covid screen scheduled for 07/25/21 at 1:00 at [facility]. Called and notified patient and will sent pft paper to her in the mail. Nothing further needed at this time.

## 2021-06-25 ENCOUNTER — Other Ambulatory Visit: Payer: Self-pay | Admitting: *Deleted

## 2021-06-25 ENCOUNTER — Other Ambulatory Visit: Payer: Self-pay

## 2021-06-25 NOTE — Patient Outreach (Signed)
[facility]) Care Management  06/25/2021  VERDEAN MURIN Apr 01, 1956 [id number]   [facility] outreach to MD referred patient Mrs CALLAHAN PEDDIE was referred by Wayland Denis, PA-C for uncontrolled diabetes on 03/19/21 [facility] admission February 19, 2021 and ED in March 06 2021   Insurance [employer] medicare and blue cross and blue shield state health plan  Mrs Doe reports she is doing the same with her diabetes she has made some improvement with her HgA1c 06/11/21 HgA1c= 8.9% was 11.1 prior to this per [facility]  She continues to have times of increase home values She is noted some increased shortness of breath, cough and congestion  She will be having pulmonary testing soon to assist with determination of diagnosis and is looking forward to this Questions answered   Patient Active Problem List   Diagnosis Date Noted   Unstable angina (HCC) 06/13/2021   Centrilobular emphysema (HCC) 05/14/2021   OSA on CPAP 05/14/2021   Chronic stable angina (HCC) 05/14/2021   Diabetes (HCC) 05/31/2016   Hypertension 05/31/2016   Migraine with aura 05/31/2016   Constipation 11/03/2011   Overweight 11/03/2011   Stress incontinence 11/03/2011   Urinary tract stones 11/03/2011   Plans  Patient agrees to care plan and follow up within the next  30 business days Pending RN CM external services at [employer] L. Noelle Penner, RN, BSN, CCM [facility] Telephonic Care Management Care Coordinator Office number [phone] Main [facility] number [phone] Fax number [phone]

## 2021-07-06 NOTE — Progress Notes (Deleted)
Cardiology Office Note  Date: 07/06/2021   ID: Meranda, Dechaine February 04, 1956, MRN [id number]  PCP:  Royann Shivers, PA-C  Cardiologist:  None Electrophysiologist:  None   Chief Complaint: Chest pain  History of Present Illness: KRISSI WILLAIMS is a 65 y.o. female with a history of DM2, HTN   Recently saw Dr. Vassie Loll on 05/14/2021.  He mentioned she seemed to have severe OSA maintained on CPAP.  She had good results with improvement in daytime somnolence and fatigue.  Plans were to obtain PFTs to clarify but Dr. Vassie Loll doubted this was the main issue or reason for her dyspnea.  He mentioned she complained of jaw pain and if jaw pain persists to call cardiology.  She was last here to follow-up on ZIO monitor.  We discussed the results of ZIO monitor.  She recently had 2 episodes of chest pain while walking at [facility].  He had not been totally compliant with taking her Imdur.  She stated she sometimes forgot.  She is wondering if the chest pain may have had something to do with her missing a dose or 2 of the medication to take it.  We talked about her recent dobutamine stress test which was reassuring.  She had upcoming PFTs with Dr. Vassie Loll.  She had been feeling fatigued since stopping her Adderall for narcolepsy.  She does have sleep apnea which has been managed by her PCP per her statement.  I advised her to speak with Dr. Vassie Loll regarding increased fatigue and sleepiness.  He mentioned maybe this could be related to the fact she may need CPAP titration.  Otherwise she denied any issues.   Imdur was increased at last visit to 60 mg.  Patient called on 06/04/2021 complaining of shortness of breath and having some "twinges" in her heart area, fatigue, high blood pressure, headache, dizziness, can barely get out of bed after increasing Imdur.  She described this as occurring over the prior 3 weeks.  She reported walking with sensation of aching chest pain.  She rated it 3 out of 10 lasting 5  minutes and stops after resting.  Also reported some accompanying shortness of breath.   She is here today for follow-up with continued complaints of increased shortness of breath.  She states it feels like it is getting worse.  She states she was recently in [facility] and was carrying light bags to her cabin when she started having significant shortness of breath/DOE with some midsternal chest pain which was relieved with rest.  She states she becomes significantly winded when she was exiting her car and entering the clinic today.  She states no one has contacted her regarding PFTs pulmonology had ordered for her.  We discussed possible cardiac catheterization as a next step.  We discussed the process and the risk and benefits.  She is willing to proceed stating she just wants to find out the real issue behind her shortness of breath and chest pain.  She states she could not tolerate the increased dose of Imdur and went back down to 30 but yesterday started the Imdur 60 mg of back again.  EKG today shows normal sinus rhythm rate of 61, septal infarct, age undetermined.   Follow-up recent cardiac catheterization  Past Medical History:  Diagnosis Date   ADD (attention deficit disorder)    Diabetes mellitus without complication (HCC)    Hypertension     Past Surgical History:  Procedure Laterality Date  INCONTINENCE SURGERY     RIGHT/LEFT HEART CATH AND CORONARY ANGIOGRAPHY N/A 06/13/2021   Procedure: RIGHT/LEFT HEART CATH AND CORONARY ANGIOGRAPHY;  Surgeon: Swaziland, Peter M, MD;  Location: [facility] INVASIVE CV LAB;  Service: Cardiovascular;  Laterality: N/A;    Current Outpatient Medications  Medication Sig Dispense Refill   aspirin EC 81 MG tablet Take 81 mg by mouth every evening.     citalopram (CELEXA) 40 MG tablet Take 40 mg by mouth every evening.     diclofenac (VOLTAREN) 75 MG EC tablet Take 75 mg by mouth 2 (two) times daily.     estradiol (ESTRACE) 2 MG tablet Take 2 mg by  mouth every evening.     EUTHYROX 25 MCG tablet Take 25 mcg by mouth daily before breakfast.     fenofibrate (TRICOR) 145 MG tablet Take 145 mg by mouth every evening.     fluorometholone (FML) 0.1 % ophthalmic suspension Place 1 drop into both eyes 3 (three) times daily as needed (allergy eyes.).     isosorbide mononitrate (IMDUR) 60 MG 24 hr tablet Take 1 tablet (60 mg total) by mouth daily. 90 tablet 3   levocetirizine (XYZAL) 5 MG tablet Take 5 mg by mouth every evening.     metFORMIN (GLUCOPHAGE) 500 MG tablet Take 2 tablets (1,000 mg total) by mouth 2 (two) times daily with a meal.     montelukast (SINGULAIR) 10 MG tablet Take 10 mg by mouth at bedtime.     Omega-3 Fatty Acids (FISH OIL) 1000 MG CAPS Take 1,000 mg by mouth every evening.     omeprazole (PRILOSEC) 40 MG capsule Take 40 mg by mouth every evening.     rOPINIRole (REQUIP) 2 MG tablet Take 2 mg by mouth at bedtime.     rosuvastatin (CRESTOR) 10 MG tablet Take 10 mg by mouth every evening.     tirzepatide [facility]) 2.5 MG/0.5ML Pen Inject 2.5 mg into the skin every Monday.     No current facility-administered medications for this visit.   Allergies:  Azithromycin, Codeine, Atomoxetine, Atorvastatin, and Lisinopril   Social History: The patient  reports that she quit smoking about 26 years ago. Her smoking use included cigarettes. She smoked an average of 2 packs per day. She has never used smokeless tobacco. She reports that she does not drink alcohol and does not use drugs.   Family History: The patient's family history includes COPD in her father; Congestive Heart Failure in her father; Emphysema in her father; Heart Problems in her father; Heart block in her father.   ROS:  Please see the history of present illness. Otherwise, complete review of systems is positive for none.  All other systems are reviewed and negative.   Physical Exam: VS:  There were no vitals taken for this visit., BMI There is no height or weight on  file to calculate BMI.  Wt Readings from Last 3 Encounters:  06/13/21 222 lb (100.7 kg)  06/10/21 226 lb 6.4 oz (102.7 kg)  05/20/21 225 lb (102.1 kg)    General: Patient appears comfortable at rest. Neck: Supple, no elevated JVP or carotid bruits, no thyromegaly. Lungs: Clear to auscultation, nonlabored breathing at rest. Cardiac: Regular rate and rhythm, no S3 or significant systolic murmur, no pericardial rub. Extremities: No pitting edema, distal pulses 2+. Skin: Warm and dry. Musculoskeletal: No kyphosis. Neuropsychiatric: Alert and oriented x3, affect grossly appropriate.  ECG: 06/10/2021 normal sinus rhythm rate of 61, septal infarct, age undetermined.  Recent Labwork:  06/11/2021: BUN 19; Creatinine, Ser 0.88; Platelets 267 06/13/2021: Hemoglobin 12.6; Hemoglobin 12.6; Potassium 4.3; Potassium 4.3; Sodium 137; Sodium 135  No results found for: CHOL, TRIG, HDL, CHOLHDL, VLDL, LDLCALC, LDLDIRECT  Other Studies Reviewed Today:  Cardiac catheterization 06/13/2021 up RIGHT/LEFT HEART CATH AND CORONARY ANGIOGRAPHY   Conclusion      Prox LAD to Mid LAD lesion is 45% stenosed.   1st Mrg lesion is 60% stenosed.   Prox RCA to Mid RCA lesion is 25% stenosed.   The left ventricular systolic function is normal.   LV end diastolic pressure is normal.   The left ventricular ejection fraction is 55-65% by visual estimate.   Hemodynamic findings consistent with pulmonary hypertension.   Nonobstructive CAD Normal LV filling pressures.  Normal right heart pressures Normal cardiac output.    Plan: I do not see a cardiac cause for her symptoms. Would recommend medical therapy and risk factor modification for her CAD.  Diagnostic Dominance: Right   Jesus Cardiac monitor 03/18/2021 12 day monitor Rare supraventricular ectopy in the form of isolated PACs, couplets, triplets. Fourteen episodes of SVT longest 8 beats Rare ventricular ectopy in the form of isolated PVCs, couplets.  Four episodes of NSVT longest 15 beats. Reported symptoms correlated with sinus rhythm, PACs, PVCs, and afib Episodes of afib <1% burden, rates were controlled Single nocturnal pause 3.1 seconds    Dobutamine stress test 02/19/2021 [facility] cardiology  Stress Dobutamine (02/19/2021 10:30 AM EDT) Specimen (Source) Anatomical Location / Laterality Collection Method / Volume Collection Time Received Time        02/19/2021 9:48 AM EDT     Imaging Results - Echocardiogram Stress Dobutamine (02/19/2021 10:30 AM EDT) Narrative  02/19/2021 11:32 AM EDT   Patient Info Name:     Lendon Collar Hansson Age:     64 years DOB:     1956/07/17 Gender:     Female MRN:     [id number] Accession #:     [id number] Ht:     163 cm Wt:     99 kg BSA:     2.16 m2 BP:     148 /     56 mmHg Technical Quality:     Fair Exam Date:     02/19/2021 9:48 AM Site Location:     [place] Exam Location:     [place] Admit Date:     02/19/2021  Exam Type:     ECHOCARDIOGRAM STRESS DOBUTAMINE  Study Info Indications     - dyspnea, jaw pain  Dobutamine stress echocardiogram is performed.  Staff Referring Physician:     Royann Shivers Ordering Physician:     Royann Shivers Sonographer:     Sinda Du  Account #:     [id number]   Summary  1. Pharmacologic stress echocardiogram does not reveal wall motion abnormality consistent with significant ischemia.  2. Patient symptom of jaw pain was reproduced at peak stress (reason for stress test).  No chest discomfort reported.   Stress Echo Findings Left Ventricle  Normal left ventricular systolic function with no regional wall motion abnormalities noted at rest.  No regional wall motion abnormalities noted post stress.   Left Ventricle  The left ventricle is normal in size with normal wall thickness.  The left ventricular systolic function is normal, LVEF is visually estimated at 60-65%.  Right Ventricle  The right ventricle is  normal in size, with normal systolic function.   Left Atrium  The left atrium is  mildly dilated in size.  Right Atrium  The right atrium is normal  in size.   Aortic Valve  The aortic valve is trileaflet with mildly thickened leaflets with normal excursion.  There is mild aortic regurgitation.  There is no evidence of a significant transvalvular gradient.  Aortic valve sclerosis without stenosis.  Pulmonary Valve  Pulmonary valve is not well visualized.  Mitral Valve  There is no significant mitral valve regurgitation.  There is no mitral stenosis.  Tricuspid Valve  There is no significant tricuspid regurgitation.   Other Findings  Rhythm: Sinus Rhythm.  Pericardium  There is no pericardial effusion.  Aorta  The aorta is normal in size in the visualized segments.   Protocol:     Dobutamine Rest HR:     61 bpm Peak HR:     144 bpm Rest Sys BP:     148 mmHg Rest Dias BP:     56 mmHg Peak Sys BP:     227 mmHg Peak Dias BP:     68 mmHg Max Pred HR:     155 bpm % Max Pred HR:     93 % Target HR:     132 bpm Max RPP:     32,688 bpm*mmHg Target HR Summary:     Patient's target heart rate was not achieved due to fatigue BP Response:     Normal blood pressure response Termination Reason:     Reached target heart rate or workload Total Time:     10 min : 25 sec Resting ECG  Normal sinus rhythm. Stress ECG  Borderline ST depression - inferior leads. Arrhythmias  Frequent PACs.   Left Ventricular Outflow Tract ---------------------------------------------------------------------- Name                                 Value        Normal ----------------------------------------------------------------------  LVOT 2D ---------------------------------------------------------------------- LVOT Diameter                       1.8 cm                LVOT Area                          2.5  cm2  Aorta ---------------------------------------------------------------------- Name                                 Value        Normal ----------------------------------------------------------------------  Ascending Aorta ---------------------------------------------------------------------- Asc Ao Diameter                     3.6 cm  Ventricles ---------------------------------------------------------------------- Name                                 Value        Normal ----------------------------------------------------------------------  LV Dimensions 2D/MM ---------------------------------------------------------------------- LVOT Diameter                       1.8 cm   Report Signatures Echo Finalized by Soheil  Assar  DO on 02/19/2021 11:32 AM Stress ECG Finalize by Soheil  Assar  DO on 02/19/2021 11:32 AM        03/07/2021  CTA  Chest W Contrast  Anatomical Region Laterality Modality  Chest -- Computed Tomography  Vascular -- --    Impression  Slightly limited examination without evidence of acute pulmonary  embolism through the proximal segmental arteries. Distal segmental  and subsegmental pulmonary arteries are not well assessed on this  examination.   Mild coronary artery calcification.   Mild paraseptal emphysema.   Mild hepatic steatosis   Aortic Atherosclerosis (ICD10-I70.0) and Emphysema (ICD10-J43.9).       Stress Myoview 03/11/2018 Study Result  Narrative & Impression  There was no ST segment deviation noted during stress. The study is normal. There are no perfusion defects This is a low risk study. The left ventricular ejection fraction is normal (55-65%).      Assessment and Plan:  1. DOE (dyspnea on exertion)   2. Chest pain of uncertain etiology   3. Type 2 diabetes mellitus without complication, without long-term current use of insulin (HCC)   4. History of tobacco abuse   5. Palpitations       1. DOE (dyspnea on  exertion) Today she continues to complain continued and progressing DOE/SOB.  She states she had trouble just walking from car to the clinic today.  States she was at [facility] over the weekend and had significant issues with dyspnea on exertion/shortness of breath which seems to be progressing.  She states during that period she had some midsternal chest pain.  We discussed since she continues with progressive shortness of breath and episodes of chest pain in spite of increased anginal therapy it may be reasonable to undergo a right and left heart catheterization.  I spoke to her about risk and benefits of cardiac catheterization.  She fully understands the risk and benefits and is willing to undergo cardiac catheterization.  Also spoke to Dr. Diona Browner who recommended both right and left heart cath if patient is willing to proceed.  Patient is willing to proceed.  She has a right and left heart catheterization scheduled for Friday, 06/13/2021 at 1030 with Dr. Swaziland.  2. Chest pain of uncertain etiology She had a recent dobutamine stress test at [facility] on 03/11/2021 which did not reveal wall motion abnormality consistent with significant ischemia.  Symptom of jaw pain was reproduced at peak stress.  No chest discomfort.  She had normal left ventricular systolic function with no regional wall motion abnormalities.  No regional wall motion abnormalities noted post-rest.  LVEF was estimated at 60 to 65%.  There was mild aortic regurgitation.  As noted above we discussed continuing shortness of breath and chest pain in spite of increasing antianginal therapy.  Patient would like to proceed with right and left heart cath for definitive evaluation of her shortness of breath and chest pain.  As noted above we discussed risk and benefits of cardiac catheterization and she is willing to proceed.  3. Type 2 diabetes mellitus without complication, without long-term current use of insulin  [facility]) Patient states she has been having some issues with controlling her diabetes.  She is taking metformin which causes her a significant amount of diarrhea.  Recent random glucose in the emergency room was 198.  4. History of tobacco abuse History of tobacco abuse.  She used to smoke approximately 25 years and has quit approximately 25 years but states she has been exposed to significant secondhand smoke from taking care of her father who was a heavy smoker for a long time until his death last year.  She  has upcoming PFTs with Dr. Vassie Loll.  5.  Palpitations/racing heart Recent ZIO monitor showed rare supraventricular ectopy in the form PACs, couplets, triplets, 14 episodes of SVT longest was 8 beats.  Rare ventricular ectopy therapy in the form of isolated PVCs, couplets.  4 episodes of NSVT longest was 15 beats.  The reported symptoms correlated with sinus rhythm, PACs, PVCs and atrial fibrillation.  Episodes of atrial fibrillation less than 1% burden.  Rates were controlled.  She had 1 single nocturnal pause of 3.1-second.  Medication Adjustments/Labs and Tests Ordered: Current medicines are reviewed at length with the patient today.  Concerns regarding medicines are outlined above.   Disposition: Follow-up with Dr. Wyline Mood or APP after cardiac catheterization.  Signed, Rennis Harding, NP 07/06/2021 6:48 PM    [facility] at [facility] [address] [place], [place], [place] [address] Phone: [phone]; Fax: [phone]

## 2021-07-07 ENCOUNTER — Ambulatory Visit: Payer: Medicare HMO | Admitting: Family Medicine

## 2021-07-07 DIAGNOSIS — R0609 Other forms of dyspnea: Secondary | ICD-10-CM

## 2021-07-07 DIAGNOSIS — Z87891 Personal history of nicotine dependence: Secondary | ICD-10-CM

## 2021-07-07 DIAGNOSIS — R079 Chest pain, unspecified: Secondary | ICD-10-CM

## 2021-07-07 DIAGNOSIS — E119 Type 2 diabetes mellitus without complications: Secondary | ICD-10-CM

## 2021-07-07 DIAGNOSIS — R002 Palpitations: Secondary | ICD-10-CM

## 2021-07-08 DIAGNOSIS — S233XXA Sprain of ligaments of thoracic spine, initial encounter: Secondary | ICD-10-CM | POA: Diagnosis not present

## 2021-07-08 DIAGNOSIS — M9902 Segmental and somatic dysfunction of thoracic region: Secondary | ICD-10-CM | POA: Diagnosis not present

## 2021-07-08 DIAGNOSIS — S134XXA Sprain of ligaments of cervical spine, initial encounter: Secondary | ICD-10-CM | POA: Diagnosis not present

## 2021-07-08 DIAGNOSIS — S338XXA Sprain of other parts of lumbar spine and pelvis, initial encounter: Secondary | ICD-10-CM | POA: Diagnosis not present

## 2021-07-08 DIAGNOSIS — M9903 Segmental and somatic dysfunction of lumbar region: Secondary | ICD-10-CM | POA: Diagnosis not present

## 2021-07-08 DIAGNOSIS — M9901 Segmental and somatic dysfunction of cervical region: Secondary | ICD-10-CM | POA: Diagnosis not present

## 2021-07-11 DIAGNOSIS — G4733 Obstructive sleep apnea (adult) (pediatric): Secondary | ICD-10-CM | POA: Diagnosis not present

## 2021-07-25 ENCOUNTER — Other Ambulatory Visit (HOSPITAL_COMMUNITY)
Admission: RE | Admit: 2021-07-25 | Discharge: 2021-07-25 | Disposition: A | Discharge status: Home / Self Care | Payer: Medicare HMO | Source: Ambulatory Visit | Attending: Pulmonary Disease | Admitting: Pulmonary Disease

## 2021-07-25 DIAGNOSIS — Z20822 Contact with and (suspected) exposure to covid-19: Secondary | ICD-10-CM | POA: Diagnosis not present

## 2021-07-25 DIAGNOSIS — Z01812 Encounter for preprocedural laboratory examination: Secondary | ICD-10-CM | POA: Insufficient documentation

## 2021-07-25 DIAGNOSIS — Z01818 Encounter for other preprocedural examination: Secondary | ICD-10-CM

## 2021-07-25 DIAGNOSIS — I200 Unstable angina: Secondary | ICD-10-CM

## 2021-07-26 LAB — SARS CORONAVIRUS 2 (TAT 6-24 HRS): SARS Coronavirus 2: NEGATIVE

## 2021-07-29 ENCOUNTER — Ambulatory Visit (HOSPITAL_COMMUNITY)
Admission: RE | Admit: 2021-07-29 | Discharge: 2021-07-29 | Disposition: A | Discharge status: Home / Self Care | Payer: Medicare HMO | Source: Ambulatory Visit | Attending: Pulmonary Disease | Admitting: Pulmonary Disease

## 2021-07-29 DIAGNOSIS — R0609 Other forms of dyspnea: Secondary | ICD-10-CM | POA: Insufficient documentation

## 2021-07-29 LAB — PULMONARY FUNCTION TEST
DL/VA % pred: 93 %
DL/VA: 3.91 ml/min/mmHg/L
DLCO unc % pred: 75 %
DLCO unc: 14.92 ml/min/mmHg
FEF 25-75 Post: 3.4 L/sec
FEF 25-75 Pre: 2.15 L/sec
FEF2575-%Change-Post: 58 %
FEF2575-%Pred-Post: 163 %
FEF2575-%Pred-Pre: 103 %
FEV1-%Change-Post: 11 %
FEV1-%Pred-Post: 93 %
FEV1-%Pred-Pre: 84 %
FEV1-Post: 2.24 L
FEV1-Pre: 2.01 L
FEV1FVC-%Change-Post: 3 %
FEV1FVC-%Pred-Pre: 107 %
FEV6-%Change-Post: 7 %
FEV6-%Pred-Post: 87 %
FEV6-%Pred-Pre: 81 %
FEV6-Post: 2.63 L
FEV6-Pre: 2.45 L
FEV6FVC-%Pred-Post: 104 %
FEV6FVC-%Pred-Pre: 104 %
FVC-%Change-Post: 7 %
FVC-%Pred-Post: 84 %
FVC-%Pred-Pre: 78 %
FVC-Post: 2.63 L
FVC-Pre: 2.45 L
Post FEV1/FVC ratio: 85 %
Post FEV6/FVC ratio: 100 %
Pre FEV1/FVC ratio: 82 %
Pre FEV6/FVC Ratio: 100 %
RV % pred: 128 %
RV: 2.71 L
TLC % pred: 105 %
TLC: 5.35 L

## 2021-07-29 MED ORDER — ALBUTEROL SULFATE (2.5 MG/3ML) 0.083% IN NEBU
2.5000 mg | INHALATION_SOLUTION | Freq: Once | RESPIRATORY_TRACT | Status: AC
  Administered 2021-07-29: 2.5 mg via RESPIRATORY_TRACT

## 2021-07-30 ENCOUNTER — Other Ambulatory Visit: Payer: Self-pay | Admitting: *Deleted

## 2021-07-30 NOTE — Patient Outreach (Signed)
[facility]) Care Management Telephonic RN Care Manager Note   07/30/2021 Name:  Rachel Odonnell MRN:  [id number] DOB:  1955-10-14  Summary: Patient voices she is doing fair  She presently had Shortness of breath with exertion, coughing and congestion  She confirms she completed pulmonary tests on 07/29/21 and is pending results  She is aware from her pcp office that she will start seeing a pcp RN CM and was informed of a date of 08/20/21 after the pcp office move to a new location  Pending updated Diabetes labs no improvements Concluded outreach as patient arrived to restaurant for dinner with her husband     Subjective: Rachel Odonnell is an 65 y.o. year old female who is a primary patient of Royann Shivers, [place]. The care management team was consulted for assistance with care management and/or care coordination needs.    Telephonic RN Care Manager completed Telephone Visit today.   Objective:  Medications Reviewed Today     Reviewed by Clinton Gallant, RN (Registered Nurse) on 07/30/21 at 1654  Med List Status: <None>   Medication Order Taking? Sig Documenting Provider Last Dose Status Informant  aspirin EC 81 MG tablet [id number]  Take 81 mg by mouth every evening. [provider]  Active Self  citalopram (CELEXA) 40 MG tablet [id number]  Take 40 mg by mouth every evening. [provider]  Active Self  diclofenac (VOLTAREN) 75 MG EC tablet [id number]  Take 75 mg by mouth 2 (two) times daily. [provider]  Active Self  estradiol (ESTRACE) 2 MG tablet [id number]  Take 2 mg by mouth every evening. [provider]  Active Self  EUTHYROX 25 MCG tablet [id number]  Take 25 mcg by mouth daily before breakfast. [provider]  Active Self  fenofibrate (TRICOR) 145 MG tablet [id number]  Take 145 mg by mouth every evening. [provider]  Active Self  fluorometholone (FML) 0.1 % ophthalmic suspension [id number]   Place 1 drop into both eyes 3 (three) times daily as needed (allergy eyes.). [provider]  Active Self  isosorbide mononitrate (IMDUR) 60 MG 24 hr tablet [id number]  Take 1 tablet (60 mg total) by mouth daily. Netta Neat., NP  Active Self  levocetirizine (XYZAL) 5 MG tablet [id number]  Take 5 mg by mouth every evening. [provider]  Active Self  metFORMIN (GLUCOPHAGE) 500 MG tablet [id number]  Take 2 tablets (1,000 mg total) by mouth 2 (two) times daily with a meal. [place], Peter M, MD  Active   metFORMIN (GLUCOPHAGE-XR) 500 MG 24 hr tablet [id number]  Take 2,000 mg by mouth daily. [provider]  Active   montelukast (SINGULAIR) 10 MG tablet [id number]  Take 10 mg by mouth at bedtime. [provider]  Active Self  Omega-3 Fatty Acids (FISH OIL) 1000 MG CAPS [id number]  Take 1,000 mg by mouth every evening. [provider]  Active Self  omeprazole (PRILOSEC) 40 MG capsule [id number]  Take 40 mg by mouth every evening. [provider]  Active Self  rOPINIRole (REQUIP) 2 MG tablet [id number]  Take 2 mg by mouth at bedtime. [provider]  Active Self  rosuvastatin (CRESTOR) 10 MG tablet [id number]  Take 10 mg by mouth every evening. [provider]  Active Self  tirzepatide Gregg[facility] Keen) 2.5 MG/0.5ML Pen [id number]  Inject 2.5 mg into the skin every Monday. [provider]  Active  SDOH:  (Social Determinants of Health) assessments and interventions performed:  SDOH Interventions    Flowsheet Row Most Recent Value  SDOH Interventions   Food Insecurity Interventions Intervention Not Indicated  Financial Strain Interventions Intervention Not Indicated  Housing Interventions Intervention Not Indicated  Intimate Partner Violence Interventions Intervention Not Indicated  Stress Interventions Intervention Not Indicated  Social Connections Interventions Intervention Not Indicated  Transportation  Interventions Intervention Not Indicated       Care Plan  Review of patient past medical history, allergies, medications, health status, including review of consultants reports, laboratory and other test data, was performed as part of comprehensive evaluation for care management services.   Care Plan : Diabetes Type 2 (Adult)  Updates made by Clinton Gallant, RN since 07/30/2021 12:00 AM     Problem: Glycemic Management (Diabetes, Type 2) Resolved 07/30/2021  Priority: High  Onset Date: 04/18/2021     Goal: Glycemic Management Optimized Completed 07/30/2021  Start Date: 04/18/2021  Expected End Date: 08/15/2021  This Visit's Progress: Not on track  Recent Progress: Not on track  Priority: High  Note:      Task: Alleviate Barriers to Glycemic Management Completed 07/30/2021  Due Date: 08/15/2021  Outcome: Positive  Responsible User: Clinton Gallant, RN  Note:   Care Management Activities:   07/30/21 Resolving due to duplicate goal- continues with elevations pending updated labs 06/25/21 continues to not check as ordered and when does with elevations   04/18/21- barriers to adherence to treatment plan identified - blood glucose monitoring encouraged - blood glucose readings reviewed - dental care encouraged - encourage participation in diabetes education classes - mutual A1C goal set or reviewed - resources required to improve adherence to care identified   Notes:    Problem: Disease Progression (Diabetes, Type 2) Resolved 07/30/2021  Priority: High  Onset Date: 04/18/2021     Long-Range Goal: Disease Progression Prevented or Minimized Completed 07/30/2021  Start Date: 04/18/2021  Expected End Date: 08/15/2021  This Visit's Progress: Not on track  Recent Progress: Not on track  Priority: High  Note:      Task: Monitor and Manage Follow-up for Comorbidities Completed 07/30/2021  Due Date: 08/16/2021  Outcome: Positive  Responsible User: Clinton Gallant, RN   Note:   Care Management Activities:   07/30/21 Resolving due to duplicate goal- continues with elevations pending updated labs 06/25/21 continues to not check as ordered and when does with elevations  04/18/21- activity based on tolerance and functional limitations encouraged - completion of annual dilated eye exam confirmed - completion of annual foot exam verified - healthy lifestyle promoted - medication side effects managed - modest weight loss (5 percent) promoted - quality of sleep assessed - reduction of sedentary activity encouraged - response to pharmacologic therapy monitored - signs/symptoms of comorbidities identified - vital signs and trends reviewed  Notes:    Problem: CHL AMB "PATIENT-SPECIFIC PROBLEM"      Care Plan : RN Care Manager Plan of Care  Updates made by Clinton Gallant, RN since 07/30/2021 12:00 AM     Problem: CHL AMB "PATIENT-SPECIFIC PROBLEM"Complex Care Coordination Needs and disease management in patient with DM emphysema   Priority: High  Onset Date: 07/30/2021     Long-Range Goal: Establish Plan of Care for Management Complex SDOH Barriers, disease management and Care Coordination Needs in patient with DM, Emphysema   Start Date: 07/30/2021  This Visit's Progress: Not on track  Priority: High  Note:   Current Barriers:  Knowledge Deficits related to plan of care for management of DMII and emphysema  Care Coordination needs related to Limited education about DM II, emphysema* and health behaviors  RN CM Clinical Goal(s):  Patient will verbalize understanding of plan for management of DMII and Emphysema as evidenced by improvements and admission prevention  through collaboration with RN Care manager, provider, and care team.   Further outreaches to assess worsening symptoms, care coordination and disease management/education needs Inter-disciplinary care team collaboration (see longitudinal plan of care) Evaluation of current treatment plan  related to  self management and patient's adherence to plan as established by provider 07/30/21 She and RN CM reviewed and discussed PFTs (Pulmonary function tests) Recommendations/Changes made from today's visit: RN CM recommended pt Complete the COPD EMMI sent to her on in 05/2021 Sent her further EMMI via e-mail on COPD including emphysema, risk factors for COPD Discussed her progression to pcp office external RN CM services for 2023 - agrees to closure in January 2023 or earlier if her pcp office indicates  COPD Interventions:  (Status:  New goal.) Long Term Goal Provided patient with basic written and verbal COPD education on self care/management/and exacerbation prevention Advised patient to track and manage COPD triggers Advised patient to self assesses COPD action plan zone and make appointment with provider if in the yellow zone for 48 hours without improvement Provided education about and advised patient to utilize infection prevention strategies to reduce risk of respiratory infection Discussed the importance of adequate rest and management of fatigue with COPD Screening for signs and symptoms of depression related to chronic disease state  Assessed social determinant of health barriers   Diabetes Interventions:  (Status:  Goal on track:  NO.) Long Term Goal Assessed patient's understanding of A1c goal: <7% Provided education to patient about basic DM disease process Reviewed medications with patient and discussed importance of medication adherence Counseled on importance of regular laboratory monitoring as prescribed No results found for: HGBA1C- 06/11/21 HgA1c= 8.9% was 11.1 prior to this per [facility]   Patient Goals/Self-Care Activities: Take all medications as prescribed  Follow Up Plan:  The patient has been provided with contact information for the care management team and has been advised to call with any health related questions or concerns.  The care management team will  reach out to the patient again over the next 30 business days.      Plan: The patient has been provided with contact information for the care management team and has been advised to call with any health related questions or concerns.  The care management team will reach out to the patient again over the next 30 business days.  Rachel Athens L. Noelle Penner, RN, BSN, CCM [facility] Telephonic Care Management Care Coordinator Office number [phone] [facility] number [phone] Fax number [phone]

## 2021-07-31 ENCOUNTER — Telehealth: Payer: Self-pay | Admitting: Pulmonary Disease

## 2021-07-31 DIAGNOSIS — M9901 Segmental and somatic dysfunction of cervical region: Secondary | ICD-10-CM | POA: Diagnosis not present

## 2021-07-31 DIAGNOSIS — M9902 Segmental and somatic dysfunction of thoracic region: Secondary | ICD-10-CM | POA: Diagnosis not present

## 2021-07-31 DIAGNOSIS — S134XXA Sprain of ligaments of cervical spine, initial encounter: Secondary | ICD-10-CM | POA: Diagnosis not present

## 2021-07-31 DIAGNOSIS — S338XXA Sprain of other parts of lumbar spine and pelvis, initial encounter: Secondary | ICD-10-CM | POA: Diagnosis not present

## 2021-07-31 DIAGNOSIS — S233XXA Sprain of ligaments of thoracic spine, initial encounter: Secondary | ICD-10-CM | POA: Diagnosis not present

## 2021-07-31 DIAGNOSIS — M9903 Segmental and somatic dysfunction of lumbar region: Secondary | ICD-10-CM | POA: Diagnosis not present

## 2021-08-01 NOTE — Telephone Encounter (Signed)
Called and gave pt results. She requested an appt with Dr. Vassie Loll to discuss narcolepsy. Appt made for Feb. 2023, soonest available for Dr.  Vassie Loll in [place]

## 2021-08-10 DIAGNOSIS — G4733 Obstructive sleep apnea (adult) (pediatric): Secondary | ICD-10-CM | POA: Diagnosis not present

## 2021-08-12 ENCOUNTER — Ambulatory Visit (INDEPENDENT_AMBULATORY_CARE_PROVIDER_SITE_OTHER): Payer: Medicare HMO | Admitting: Cardiology

## 2021-08-12 ENCOUNTER — Telehealth: Payer: Self-pay | Admitting: Cardiology

## 2021-08-12 ENCOUNTER — Encounter: Payer: Self-pay | Admitting: Cardiology

## 2021-08-12 ENCOUNTER — Other Ambulatory Visit: Payer: Self-pay

## 2021-08-12 ENCOUNTER — Encounter: Payer: Self-pay | Admitting: *Deleted

## 2021-08-12 VITALS — BP 128/60 | HR 64 | Ht 64.0 in | Wt 224.2 lb

## 2021-08-12 DIAGNOSIS — I2510 Atherosclerotic heart disease of native coronary artery without angina pectoris: Secondary | ICD-10-CM | POA: Diagnosis not present

## 2021-08-12 DIAGNOSIS — R0609 Other forms of dyspnea: Secondary | ICD-10-CM | POA: Diagnosis not present

## 2021-08-12 DIAGNOSIS — E782 Mixed hyperlipidemia: Secondary | ICD-10-CM | POA: Diagnosis not present

## 2021-08-12 DIAGNOSIS — I4891 Unspecified atrial fibrillation: Secondary | ICD-10-CM

## 2021-08-12 MED ORDER — APIXABAN 5 MG PO TABS: 5.0000 mg | ORAL_TABLET | Freq: Two times a day (BID) | ORAL | 0 refills | Status: DC

## 2021-08-12 MED ORDER — APIXABAN 5 MG PO TABS: 5.0000 mg | ORAL_TABLET | Freq: Two times a day (BID) | ORAL | 6 refills | Status: DC

## 2021-08-12 NOTE — Telephone Encounter (Signed)
Pt c/o medication issue:  1. Name of Medication: apixaban (ELIQUIS) 5 MG TABS tablet  2. How are you currently taking this medication (dosage and times per day)?   3. Are you having a reaction (difficulty breathing--STAT)?   4. What is your medication issue? PT IS NOT ABLE TO AFFORD THIS MEDICATION. SHE WANTS TO KNOW IF SHE CAN BE PRESCRIBED SOMETHING ELSE

## 2021-08-12 NOTE — Progress Notes (Signed)
Clinical Summary Rachel Odonnell is a 65 y.o.female former patient of Dr Purvis Sheffield, this is our first visit together.   Chest pain/DOE/CAD - Nuclear stress test on 03/11/2018 was normal   02/2021 DSE [facility]: baseline LVEF 60-65%, normal RV, mild AI. 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 LVEDP 11, mean PA 22, PCWP 2, CI 2.3   07/2021 PFTs: some restriction likely secondary to body habitus - seen by pulmonary - chronic SOB overall stable.    2. 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 LVEDP 11, mean PA 22, PCWP 2, CI 2.3 - imdur has helped with symptoms, one episode of jaw pain since last visit.    3. Afib - noted on recent monitor - sporadic palpitations, overall infrequent    4. OSA - on cpap  5. Hyperlipidemia - she is on crestor 10mg , fenofirbate.  Past Medical History:  Diagnosis Date   ADD (attention deficit disorder)    Diabetes mellitus without complication (HCC)    Hypertension      Allergies  Allergen Reactions   Azithromycin Hives   Codeine Nausea Only   Atomoxetine Nausea And Vomiting   Atorvastatin Other (See Comments)    Restless legs   Lisinopril Nausea And Vomiting     Current Outpatient Medications  Medication Sig Dispense Refill   aspirin EC 81 MG tablet Take 81 mg by mouth every evening.     citalopram (CELEXA) 40 MG tablet Take 40 mg by mouth every evening.     diclofenac (VOLTAREN) 75 MG EC tablet Take 75 mg by mouth 2 (two) times daily.     estradiol (ESTRACE) 2 MG tablet Take 2 mg by mouth every evening.     EUTHYROX 25 MCG tablet Take 25 mcg by mouth daily before breakfast.     fenofibrate (TRICOR) 145 MG tablet Take 145 mg by mouth every evening.     fluorometholone (FML) 0.1 % ophthalmic suspension Place 1 drop into both eyes 3 (three) times daily as needed (allergy eyes.).     isosorbide mononitrate (IMDUR) 60 MG 24 hr tablet Take 1 tablet (60 mg total) by mouth daily. 90 tablet  3   levocetirizine (XYZAL) 5 MG tablet Take 5 mg by mouth every evening.     metFORMIN (GLUCOPHAGE) 500 MG tablet Take 2 tablets (1,000 mg total) by mouth 2 (two) times daily with a meal.     metFORMIN (GLUCOPHAGE-XR) 500 MG 24 hr tablet Take 2,000 mg by mouth daily.     montelukast (SINGULAIR) 10 MG tablet Take 10 mg by mouth at bedtime.     Omega-3 Fatty Acids (FISH OIL) 1000 MG CAPS Take 1,000 mg by mouth every evening.     omeprazole (PRILOSEC) 40 MG capsule Take 40 mg by mouth every evening.     rOPINIRole (REQUIP) 2 MG tablet Take 2 mg by mouth at bedtime.     rosuvastatin (CRESTOR) 10 MG tablet Take 10 mg by mouth every evening.     tirzepatide [facility]) 2.5 MG/0.5ML Pen Inject 2.5 mg into the skin every Monday.     No current facility-administered medications for this visit.     Past Surgical History:  Procedure Laterality Date   INCONTINENCE SURGERY     RIGHT/LEFT HEART CATH AND CORONARY ANGIOGRAPHY N/A 06/13/2021   Procedure: RIGHT/LEFT HEART CATH AND CORONARY ANGIOGRAPHY;  Surgeon: 06/15/2021, Peter M, MD;  Location: [facility] INVASIVE CV LAB;  Service: Cardiovascular;  Laterality: N/A;  Allergies  Allergen Reactions   Azithromycin Hives   Codeine Nausea Only   Atomoxetine Nausea And Vomiting   Atorvastatin Other (See Comments)    Restless legs   Lisinopril Nausea And Vomiting      Family History  Problem Relation Age of Onset   Heart Problems Father    COPD Father    Emphysema Father    Heart block Father    Congestive Heart Failure Father      Social History Rachel Odonnell reports that she quit smoking about 27 years ago. Her smoking use included cigarettes. She smoked an average of 2 packs per day. She has never used smokeless tobacco. Rachel Odonnell reports no history of alcohol use.   Review of Systems CONSTITUTIONAL: No weight loss, fever, chills, weakness or fatigue.  HEENT: Eyes: No visual loss, blurred vision, double vision or yellow sclerae.No hearing loss,  sneezing, congestion, runny nose or sore throat.  SKIN: No rash or itching.  CARDIOVASCULAR: per hpi RESPIRATORY: No shortness of breath, cough or sputum.  GASTROINTESTINAL: No anorexia, nausea, vomiting or diarrhea. No abdominal pain or blood.  GENITOURINARY: No burning on urination, no polyuria NEUROLOGICAL: No headache, dizziness, syncope, paralysis, ataxia, numbness or tingling in the extremities. No change in bowel or bladder control.  MUSCULOSKELETAL: No muscle, back pain, joint pain or stiffness.  LYMPHATICS: No enlarged nodes. No history of splenectomy.  PSYCHIATRIC: No history of depression or anxiety.  ENDOCRINOLOGIC: No reports of sweating, cold or heat intolerance. No polyuria or polydipsia.  Marland Kitchen   Physical Examination Today's Vitals   08/12/21 1342  BP: 128/60  Pulse: 64  SpO2: 97%  Weight: 224 lb 3.2 oz (101.7 kg)  Height: 5\' 4"  (1.626 m)   Body mass index is 38.48 kg/m.  Gen: resting comfortably, no acute distress HEENT: no scleral icterus, pupils equal round and reactive, no palptable cervical adenopathy,  CV: RRR, 2/6 sysotlic murmur rusb, no jvd Resp: Clear to auscultation bilaterally GI: abdomen is soft, non-tender, non-distended, normal bowel sounds, no hepatosplenomegaly MSK: extremities are warm, no edema.  Skin: warm, no rash Neuro:  no focal deficits Psych: appropriate affect   Diagnostic Studies 05/2021 RHC/LHC   Prox LAD to Mid LAD lesion is 45% stenosed.   1st Mrg lesion is 60% stenosed.   Prox RCA to Mid RCA lesion is 25% stenosed.   The left ventricular systolic function is normal.   LV end diastolic pressure is normal.   The left ventricular ejection fraction is 55-65% by visual estimate.   Hemodynamic findings consistent with pulmonary hypertension.   Nonobstructive CAD Normal LV filling pressures.  Normal right heart pressures Normal cardiac output.    Plan: I do not see a cardiac cause for her symptoms. Would recommend medical therapy  and risk factor modification for her CAD.   04/2021 monitor 12 day monitor Rare supraventricular ectopy in the form of isolated PACs, couplets, triplets. Fourteen episodes of SVT longest 8 beats Rare ventricular ectopy in the form of isolated PVCs, couplets. Four episodes of NSVT longest 15 beats. Reported symptoms correlated with sinus rhythm, PACs, PVCs, and afib Episodes of afib <1% burden, rates were controlled Single nocturnal pause 3.1 seconds   Assessment and Plan  1.DOE/SOB - overall benign cardiac workup with echo and RHC/LHC - no further cardiac testing planned at this time  2. Afib Noted on recent monitor, her CHADS2Vasc score is 4, start eliquis 5mg  bid. Stop ASA, stop diclofenac  3. CAD - mild to moderate by  recent cath - jaw pain has improved with imdur, perhaps some vasospasm. Room to titrate if needed.   4. Hyperlipidemia - request labs from pcp, continue current meds    Antoine Poche, M.D.

## 2021-08-12 NOTE — Patient Instructions (Signed)
Medication Instructions:  Stop Aspirin  Stop Diclofenac (Voltaren) Begin Eliquis 5mg  twice a day   Continue all other medications.     Labwork: none  Testing/Procedures: none  Follow-Up: 4 months   Any Other Special Instructions Will Be Listed Below (If Applicable).   If you need a refill on your cardiac medications before your next appointment, please call your pharmacy.

## 2021-08-13 NOTE — Telephone Encounter (Signed)
Replied to patient via mychart

## 2021-08-20 DIAGNOSIS — M9903 Segmental and somatic dysfunction of lumbar region: Secondary | ICD-10-CM | POA: Diagnosis not present

## 2021-08-20 DIAGNOSIS — S338XXA Sprain of other parts of lumbar spine and pelvis, initial encounter: Secondary | ICD-10-CM | POA: Diagnosis not present

## 2021-08-20 DIAGNOSIS — M9901 Segmental and somatic dysfunction of cervical region: Secondary | ICD-10-CM | POA: Diagnosis not present

## 2021-08-20 DIAGNOSIS — S233XXA Sprain of ligaments of thoracic spine, initial encounter: Secondary | ICD-10-CM | POA: Diagnosis not present

## 2021-08-20 DIAGNOSIS — S134XXA Sprain of ligaments of cervical spine, initial encounter: Secondary | ICD-10-CM | POA: Diagnosis not present

## 2021-08-20 DIAGNOSIS — M9902 Segmental and somatic dysfunction of thoracic region: Secondary | ICD-10-CM | POA: Diagnosis not present

## 2021-08-21 ENCOUNTER — Ambulatory Visit: Payer: Medicare HMO | Admitting: Family Medicine

## 2021-08-21 ENCOUNTER — Ambulatory Visit: Payer: Medicare HMO | Admitting: Cardiology

## 2021-08-22 ENCOUNTER — Other Ambulatory Visit: Payer: Self-pay | Admitting: *Deleted

## 2021-08-22 ENCOUNTER — Telehealth: Payer: Self-pay | Admitting: Pulmonary Disease

## 2021-08-22 ENCOUNTER — Other Ambulatory Visit: Payer: Self-pay

## 2021-08-22 ENCOUNTER — Encounter: Payer: Self-pay | Admitting: *Deleted

## 2021-08-22 MED ORDER — AMOXICILLIN-POT CLAVULANATE 875-125 MG PO TABS: 1.0000 | ORAL_TABLET | Freq: Two times a day (BID) | ORAL | 0 refills | Status: DC

## 2021-08-22 NOTE — Telephone Encounter (Signed)
Primary Pulmonologist: Vassie Loll Last office visit and with whom: 05/14/2021 Vassie Loll What do we see them for (pulmonary problems): DOE, emphysema, OSA on CPAP Last OV assessment/plan:  Assessment & Plan:           Assessment & Plan Note by Oretha Milch, MD at 05/14/2021 12:21 PM  Author: Oretha Milch, MD Author Type: Physician Filed: 05/14/2021 12:22 PM  Note Status: Written Cosign: Cosign Not Required Encounter Date: 05/14/2021  Problem: Chronic stable angina (HCC)  Editor: Oretha Milch, MD (Physician)               Her persistent jaw pain is still concerning to me, this has decreased on the Imdur but she had a couple of episodes. I have asked her to contact cardiology should the frequency worsen.  Normal dobutamine stress echo is reassuring        Assessment & Plan Note by Oretha Milch, MD at 05/14/2021 12:15 PM  Author: Oretha Milch, MD Author Type: Physician Filed: 05/14/2021 12:16 PM  Note Status: Written Cosign: Cosign Not Required Encounter Date: 05/14/2021  Problem: OSA on CPAP  Editor: Oretha Milch, MD (Physician)               She seems to have severe OSA, maintained on CPAP, DME is [facility].  She has good results with improvement in her daytime somnolence and fatigue.  She appears to be compliant by report        Assessment & Plan Note by Oretha Milch, MD at 05/14/2021 12:15 PM  Author: Oretha Milch, MD Author Type: Physician Filed: 05/14/2021 12:15 PM  Note Status: Written Cosign: Cosign Not Required Encounter Date: 05/14/2021  Problem: Centrilobular emphysema (HCC)  Editor: Oretha Milch, MD (Physician)               Mild paraseptal emphysema was noted on her CT angiogram chest in July. We will obtain PFTs to clarify but I doubt that this is the main issue or the reason for her dyspnea.  She has been quit smoking since 1996        Patient Instructions by Oretha Milch, MD at 05/14/2021 11:00 AM  Author: Oretha Milch, MD Author Type: Physician Filed:  05/14/2021 11:35 AM  Note Status: Addendum Cosign: Cosign Not Required Encounter Date: 05/14/2021  Editor: Oretha Milch, MD (Physician)      Prior Versions: 1. Oretha Milch, MD (Physician) at 05/14/2021 11:22 AM - Signed    Schedule PFTs If jaw pain persists, please call cardiology       Orthostatic Vitals Recorded in This Encounter   05/14/2021  1110     Patient Position: Sitting  BP Location: Left Arm   Instructions  Schedule PFTs If jaw pain persists, please call cardiology       Was appointment offered to patient (explain)?  no   Reason for call: Has been coughing up green mucous for a week.  She said she thought it was just normal for people with emphysema.  Denies any fever, chills or body aches.   No increased sob or chest congestion.  Having post nasal drip.  Denies any sick contacts.  She is covid vaccinated.  No recent covid test.    Dr. Vassie Loll, please advise.  Thank you.   (examples of things to ask: : When did symptoms start? Fever? Cough? Productive? Color to sputum? More sputum than usual? Wheezing? Have you needed increased oxygen? Are you taking  your respiratory medications? What over the counter measures have you tried?)  Allergies  Allergen Reactions   Azithromycin Hives   Codeine Nausea Only   Atomoxetine Nausea And Vomiting   Atorvastatin Other (See Comments)    Restless legs   Lisinopril Nausea And Vomiting    Immunization History  Administered Date(s) Administered   Fluad Quad(high Dose 65+) 05/14/2021   Moderna SARS-COV2 Booster Vaccination 06/13/2020   Moderna Sars-Covid-2 Vaccination 09/06/2019, 10/04/2019   Pneumococcal Polysaccharide-23 04/22/2017   Pneumococcal-Unspecified 05/31/2020   Tdap 04/22/2017   Zoster Recombinat (Shingrix) 11/30/2017, 02/20/2018

## 2021-08-22 NOTE — Patient Outreach (Signed)
[facility] Care Management Telephonic RN Care Manager Note   08/22/2021 Name:  Rachel Odonnell MRN:  [id number] DOB:  September 04, 1955  Summary: [facility] vs [employer] medicare for 2023  Rachel Cliffton AstersWhite confirms she is aware that the pcp office has an external RN CM services but she has not had an outreach at this time. She agrees to update RN CM  She has been informed recently she has Atrial fibrillation and emphysema Today she and RN CM reviewed her home management for these diagnoses  176 cbg fasting In 2023 she will have her daughter and her family to stay with her for a short period of time as they transition Post nasal treatment plan Green mucus  Recommendations/Changes made from today's visit: [facility] plus pending external RN CM services Completed assessments for afib and copd with education on home management to include her keeping MD appointments, medicines, cardiac/pulmonary rehabs, coumadin clinics discussed Recommend a diary to determine her individual atrial fib symptoms (she noted she breaks out in a sweat, heart race feels faint then she rests)  Outreach with patient to Dr Vassie LollAlva pulmonary [phone] after she discussed a cough, with green mucus  Spoke with Rachel Odonnell to send a message to Dr Reginia NaasAlva's nurse Encouraged patient to review the EMMI education assigned to her that is noted not to have been reviewed yet Sent further EMMI education on atrial fibrillation and diabetes type 2     Subjective: Rachel Odonnell is an 66 y.o. year old female who is a primary patient of Royann ShiversSkillman, Katherine E, [place]. The care management team was consulted for assistance with care management and/or care coordination needs.    Telephonic RN Care Manager completed Telephone Visit today.   Objective:  Medications Reviewed Today     Reviewed by Clinton GallantGibbs, Sherly Brodbeck L, RN (Registered Nurse) on 08/22/21 at 1403  Med  List Status: <None>   Medication Order Taking? Sig Documenting Provider Last Dose Status Informant  apixaban (ELIQUIS) 5 MG TABS tablet [id number]  Take 1 tablet (5 mg total) by mouth 2 (two) times daily. Antoine PocheBranch, Jonathan F, MD  Active   citalopram (CELEXA) 40 MG tablet [id number]  Take 40 mg by mouth every evening. [provider]  Active Self  estradiol (ESTRACE) 2 MG tablet [id number]  Take 2 mg by mouth every evening. [provider]  Active Self  EUTHYROX 25 MCG tablet [id number]  Take 25 mcg by mouth daily before breakfast. [provider]  Active Self  fenofibrate (TRICOR) 145 MG tablet [id number]  Take 145 mg by mouth every evening. [provider]  Active Self  fluorometholone (FML) 0.1 % ophthalmic suspension [id number]  Place 1 drop into both eyes 3 (three) times daily as needed (allergy eyes.). [provider]  Active Self  isosorbide mononitrate (IMDUR) 60 MG 24 hr tablet [id number]  Take 1 tablet (60 mg total) by mouth daily. Netta NeatQuinn, Andrew L Jr., NP  Active Self  levocetirizine (XYZAL) 5 MG tablet [id number]  Take 5 mg by mouth every evening. [provider]  Active Self  metFORMIN (GLUCOPHAGE) 500 MG tablet [id number]  Take 2 tablets (1,000 mg total) by mouth 2 (two) times daily with a meal. [place], Peter M, MD  Active   montelukast (SINGULAIR) 10 MG tablet [id number]  Take 10 mg by mouth at bedtime. [provider]  Active Self  Omega-3 Fatty Acids (FISH OIL) 1000 MG CAPS  [id number]  Take 1,000 mg by mouth every evening. [provider]  Active Self  omeprazole (PRILOSEC) 40 MG capsule [id number]  Take 40 mg by mouth every evening. [provider]  Active Self  rOPINIRole (REQUIP) 2 MG tablet [id number]  Take 2 mg by mouth at bedtime. [provider]  Active Self  rosuvastatin (CRESTOR) 10 MG tablet [id number]  Take 10 mg by mouth every evening. [provider]  Active Self  tirzepatide Greggory Keen) 2.5  MG/0.5ML Pen [id number]  Inject 2.5 mg into the skin every Monday. [provider]  Active             Patient Active Problem List   Diagnosis Date Noted   Unstable angina (HCC) 06/13/2021   Centrilobular emphysema (HCC) 05/14/2021   OSA on CPAP 05/14/2021   Chronic stable angina (HCC) 05/14/2021   Diabetes (HCC) 05/31/2016   Hypertension 05/31/2016   Migraine with aura 05/31/2016   Constipation 11/03/2011   Overweight 11/03/2011   Stress incontinence 11/03/2011   Urinary tract stones 11/03/2011    SDOH:  (Social Determinants of Health) assessments and interventions performed:    Care Plan  Review of patient past medical history, allergies, medications, health status, including review of consultants reports, laboratory and other test data, was performed as part of comprehensive evaluation for care management services.   Care Plan : Diabetes Type 2 (Adult)  Updates made by Clinton Gallant, RN since 08/22/2021 12:00 AM  Completed 08/22/2021   Problem: CHL AMB "PATIENT-SPECIFIC PROBLEM" Resolved 08/22/2021     Care Plan : RN Care Manager Plan of Care  Updates made by Clinton Gallant, RN since 08/22/2021 12:00 AM     Problem: CHL AMB "PATIENT-SPECIFIC PROBLEM"Complex Care Coordination Needs and disease management in patient with DM emphysema   Priority: High  Onset Date: 07/30/2021     Long-Range Goal: Establish Plan of Care for Management Complex SDOH Barriers, disease management and Care Coordination Needs in patient with DM, Emphysema   Start Date: 07/30/2021  This Visit's Progress: On track  Recent Progress: Not on track  Priority: High  Note:   Current Barriers:  Knowledge Deficits related to plan of care for management of Atrial Fibrillation, COPD, DMII, and emphysema  Care Coordination needs related to Limited education about DM II, COPD/emphysema, Atrial fibrillation* and health behaviors Barriers:Health Behaviors Knowledge   RN CM Clinical Goal(s):   Patient will verbalize understanding of plan for management of DMII and Emphysema as evidenced by improvements and admission prevention  through collaboration with RN Care manager, provider, and care team.  Further outreaches to assess worsening symptoms, care coordination and disease management/education needs Inter-disciplinary care team collaboration (see longitudinal plan of care) Evaluation of current treatment plan related to  self management and patient's adherence to plan as established by provider Discussed her change from [facility] changes [facility] complex program eligibility plus pending external RN CM services 08/22/21 Completed assessments for afib and copd with education on home management to include her keeping MD appointments, medicines, cardiac/pulmonary rehabs, coumadin clinics discussed   COPD Interventions:  (Status:  Goal on track:  Yes.) Long Term Goal Provided patient with basic written and verbal COPD education on self care/management/and exacerbation prevention Advised patient to track and manage COPD triggers Advised patient to self assesses COPD action plan zone and make appointment with provider if in the yellow zone for 48 hours without improvement Provided education about and advised patient to utilize infection prevention strategies to reduce risk of  respiratory infection Discussed the importance of adequate rest and management of fatigue with COPD Screening for signs and symptoms of depression related to chronic disease state  Assessed social determinant of health barriers Reminded her to review COPD EMMI not review by her yet  08/22/21 outreach with her to pulmonologist office to leave a message for RN about pt reported post nasal drainage, green intermittent productive mucus recently- no fever   Diabetes Interventions:  (Status:  Goal on track:  NO.) Long Term Goal Assessed patient's understanding of A1c goal: <7% Provided education to patient about basic DM disease  process Reviewed medications with patient and discussed importance of medication adherence Counseled on importance of regular laboratory monitoring as prescribed No results found for: HGBA1C-  06/11/21 HgA1c= 8.9% was 11.1 prior to this per [facility] 08/22/21 cbg during outreach = 176 - sent EMMI DM education   Patient Goals/Self-Care Activities: Take all medications as prescribed Attend all scheduled provider appointments Perform all self care activities independently  Perform IADL's (shopping, preparing meals, housekeeping, managing finances) independently Call provider office for new concerns or questions  begin a symptom diary  AFIB Interventions: (Status:  New goal.) Long Term Goal   Reviewed importance of adherence to anticoagulant exactly as prescribed Counseled on avoidance of NSAIDs due to increased bleeding risk with anticoagulants Counseled on importance of regular laboratory monitoring as prescribed Afib action plan reviewed Recommend a diary to determine her individual atrial fib symptoms (she noted she breaks out in a sweat, heart race feels faint then she rests)  Sent EMMI Afib education 08/22/21  Follow Up Plan:  The patient has been provided with contact information for the care management team and has been advised to call with any health related questions or concerns.       Plan:  patient will be transferred to her pcp external care management program unless patient notifies RN CM this is not her pcp office plan  Abdulraheem Pineo L. Noelle Penner, RN, BSN, CCM [facility] Telephonic Care Management Care Coordinator Office number [phone] [facility] number [phone] Fax number [phone]

## 2021-08-22 NOTE — Telephone Encounter (Signed)
Called and spoke with patient, advised of recommendations per Dr. Elsworth Soho, she verbalized understanding.  Verified pharmacy and script set for Augmentin.  Nothing further needed.

## 2021-09-08 DIAGNOSIS — Z20828 Contact with and (suspected) exposure to other viral communicable diseases: Secondary | ICD-10-CM | POA: Diagnosis not present

## 2021-09-23 ENCOUNTER — Telehealth: Payer: Self-pay

## 2021-09-23 NOTE — Telephone Encounter (Signed)
Called and spoke to patient and asked if she could bring in her SD card from CPAP tomorrow during appt. Patient agreed if she has a SD Card she will bring it in. Nothing further needed.

## 2021-09-24 ENCOUNTER — Other Ambulatory Visit: Payer: Self-pay

## 2021-09-24 ENCOUNTER — Ambulatory Visit (INDEPENDENT_AMBULATORY_CARE_PROVIDER_SITE_OTHER): Payer: Medicare HMO | Admitting: Pulmonary Disease

## 2021-09-24 ENCOUNTER — Encounter: Payer: Self-pay | Admitting: Pulmonary Disease

## 2021-09-24 VITALS — BP 136/86 | HR 60 | Temp 98.6°F | Ht 64.0 in | Wt 224.1 lb

## 2021-09-24 DIAGNOSIS — J432 Centrilobular emphysema: Secondary | ICD-10-CM

## 2021-09-24 DIAGNOSIS — G4733 Obstructive sleep apnea (adult) (pediatric): Secondary | ICD-10-CM | POA: Diagnosis not present

## 2021-09-24 DIAGNOSIS — G2581 Restless legs syndrome: Secondary | ICD-10-CM | POA: Diagnosis not present

## 2021-09-24 DIAGNOSIS — Z9989 Dependence on other enabling machines and devices: Secondary | ICD-10-CM | POA: Diagnosis not present

## 2021-09-24 DIAGNOSIS — G4710 Hypersomnia, unspecified: Secondary | ICD-10-CM

## 2021-09-24 NOTE — Progress Notes (Signed)
Subjective:    Patient ID: Rachel Odonnell, female    DOB: 03/09/1956, 66 y.o.   MRN: [id number]  HPI   66 yo ex-smoker with mild emphysema and OSA whom I saw in consultation 04/2021 .  She was having shortness of breath on exertion and chest pain radiating to her jaws.  Normal dobutamine stress echo   PMH - ? Narcolepsy vs ADD -she was on Adderall which was stopped by PCP earlier this year. -Severe OSA was diagnosed in 2022 with AHI 43/hour and maintained on nasal CPAP of 10 cm -Atrial fibrillation   She lives with her husband and is a retired Magazine features editor, still works part-time at the [employer], used to work Designer, industrial/product Complaint  Patient presents with   Follow-up    Patient is feeling fatigue more than usual lately.    She presents today for evaluation of persistent fatigue and evaluation of OSA/narcolepsy We treated her in January for sinusitis with Augmentin We referred her for further cardiology evaluation, she was found to be in atrial fibrillation and is now on apixaban.  She was diagnosed with severe OSA and started on CPAP with nasal mask, she is adjusting well to this. 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 She also reports being put on Adderall years ago and this seemed to help. She now reports excessive daytime somnolence, Epworth sleepiness score is 18. She is often napping even in the early morning. 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 LHC/RHC nml cors, PA pressures  PFTs 07/2021 mild restriction, ratio 82, FEV1 84%, FVC  78%, DLCO 75%  Past Medical History:  Diagnosis Date   ADD (attention deficit disorder)    Diabetes mellitus without complication ([place])    Hypertension     Past Surgical History:  Procedure Laterality Date   INCONTINENCE SURGERY     RIGHT/LEFT HEART CATH AND CORONARY ANGIOGRAPHY N/A 06/13/2021   Procedure: RIGHT/LEFT HEART CATH AND CORONARY ANGIOGRAPHY;  Surgeon: Martinique, Peter M, MD;  Location: [place] [facility];  Service: Cardiovascular;  Laterality: N/A;    Allergies  Allergen Reactions   Azithromycin Hives   Codeine Nausea Only   Atomoxetine Nausea And Vomiting   Atorvastatin Other (See Comments)    Restless legs   Lisinopril Nausea And Vomiting    Social History   Socioeconomic History   Marital status: Married    Spouse name: Milbert Coulter   Number of children: Not on file   Years of education: Not on file   Highest education level: Not on file  Occupational History   Not on file  Tobacco Use   Smoking status: Former    Packs/day: 2.00    Types: Cigarettes    Quit date: 08/17/1994    Years since quitting: 27.1   Smokeless tobacco: Never  Substance and Sexual Activity   Alcohol use: No   Drug use: No   Sexual activity: Not on file  Other Topics Concern   Not on file  Social History Narrative  Lives at home with husband, Milbert Coulter   Former smoker Smoked 2 ppd x 25 yrs; Quit in 1996   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, academic   Now on fixed income from her [place] and pension   Social Determinants of Radio broadcast assistant Strain: Low Risk    Difficulty of Paying Living Expenses: Not hard at [employer] Insecurity: No Food Insecurity   Worried About Charity fundraiser in the Last Year: Never true   Arboriculturist in the Last Year: Never true  Transportation Needs: No Data processing manager (Medical): No   Lack of Transportation (Non-Medical): No  Physical Activity: Unknown   Days of Exercise per Week: 1 day    Minutes of Exercise per Session: Not on file  Stress: No Stress Concern Present   Feeling of Stress : Only a little  Social Connections: Engineer, building services of Communication with Friends and Family: More than three times a week   Frequency of Social Gatherings with Friends and Family: More than three times a week   Attends Religious Services: More than 4 times per year   Active Member of [employer] or Organizations: Yes   Attends Music therapist: More than 4 times per year   Marital Status: Married  Human resources officer Violence: Not At Risk   Fear of Current or Ex-Partner: No   Emotionally Abused: No   Physically Abused: No   Sexually Abused: No     Family History  Problem Relation Age of Onset   Heart Problems Father    COPD Father    Emphysema Father    Heart block Father    Congestive Heart Failure Father       Review of Systems neg for any significant sore throat, dysphagia, itching, sneezing, nasal congestion or excess/ purulent secretions, fever, chills, sweats, unintended wt loss, pleuritic or exertional cp, hempoptysis, orthopnea pnd or change in chronic leg swelling. Also denies presyncope, palpitations, heartburn, abdominal pain, nausea, vomiting, diarrhea or change in bowel or urinary habits, dysuria,hematuria, rash, arthralgias, visual complaints, headache, numbness weakness or ataxia.     Objective:   Physical Exam  Gen. Pleasant, obese, in no distress ENT - no lesions, no post nasal drip Neck: No JVD, no thyromegaly, no carotid bruits Lungs: no use of accessory muscles, no dullness to percussion, decreased without rales or rhonchi  Cardiovascular: Rhythm regular, heart sounds  normal, no murmurs or gallops, no peripheral edema Musculoskeletal: No deformities, no cyanosis or clubbing , no tremors       Assessment & Plan:

## 2021-09-24 NOTE — Patient Instructions (Signed)
° °  X CPAP titration study followed by nap test / MSLT  X change CPAP to 5-14 cm

## 2021-09-24 NOTE — Assessment & Plan Note (Signed)
CPAP download was reviewed which shows good control of events on auto settings 4 to 20 cm with average pressure of 11 cm. We will change her to auto CPAP 5 to 14 cm.  Weight loss encouraged, compliance with goal of at least 4-6 hrs every night is the expectation. Advised against medications with sedative side effects Cautioned against driving when sleepy - understanding that sleepiness will vary on a day to day basis

## 2021-09-24 NOTE — Assessment & Plan Note (Signed)
I have asked her to consider decreasing Requip to 1 mg and see if this improves her hypersomnolence

## 2021-09-24 NOTE — Assessment & Plan Note (Signed)
Persistent hypersomnolence in spite of CPAP use.  This may still be related to OSA .  She was previously given a diagnosis of narcolepsy so we will have to investigate further. Proceed with formal CPAP titration study followed by MSLT if more than 2 sleep onset REM naps are noted and we would make a formal diagnosis of narcolepsy.  She does not have any signs of cataplexy. Caution against using stimulants given her history of atrial fibrillation

## 2021-09-24 NOTE — Assessment & Plan Note (Signed)
Minimal on CT mainly, no significant airway obstruction Does not need medications

## 2021-09-25 ENCOUNTER — Encounter: Payer: Self-pay | Admitting: Cardiology

## 2021-09-25 DIAGNOSIS — Z124 Encounter for screening for malignant neoplasm of cervix: Secondary | ICD-10-CM | POA: Diagnosis not present

## 2021-09-25 DIAGNOSIS — F419 Anxiety disorder, unspecified: Secondary | ICD-10-CM | POA: Diagnosis not present

## 2021-09-25 DIAGNOSIS — Z6837 Body mass index (BMI) 37.0-37.9, adult: Secondary | ICD-10-CM | POA: Diagnosis not present

## 2021-09-25 DIAGNOSIS — Z209 Contact with and (suspected) exposure to unspecified communicable disease: Secondary | ICD-10-CM | POA: Diagnosis not present

## 2021-09-25 DIAGNOSIS — G4733 Obstructive sleep apnea (adult) (pediatric): Secondary | ICD-10-CM | POA: Diagnosis not present

## 2021-09-25 DIAGNOSIS — J309 Allergic rhinitis, unspecified: Secondary | ICD-10-CM | POA: Diagnosis not present

## 2021-09-25 DIAGNOSIS — K219 Gastro-esophageal reflux disease without esophagitis: Secondary | ICD-10-CM | POA: Diagnosis not present

## 2021-09-25 DIAGNOSIS — E785 Hyperlipidemia, unspecified: Secondary | ICD-10-CM | POA: Diagnosis not present

## 2021-09-25 DIAGNOSIS — E039 Hypothyroidism, unspecified: Secondary | ICD-10-CM | POA: Diagnosis not present

## 2021-09-25 DIAGNOSIS — Z0000 Encounter for general adult medical examination without abnormal findings: Secondary | ICD-10-CM | POA: Diagnosis not present

## 2021-09-25 DIAGNOSIS — Z0142 Encounter for cervical smear to confirm findings of recent normal smear following initial abnormal smear: Secondary | ICD-10-CM | POA: Diagnosis not present

## 2021-09-25 DIAGNOSIS — E1165 Type 2 diabetes mellitus with hyperglycemia: Secondary | ICD-10-CM | POA: Diagnosis not present

## 2021-09-25 DIAGNOSIS — R251 Tremor, unspecified: Secondary | ICD-10-CM | POA: Diagnosis not present

## 2021-09-25 LAB — HEMOGLOBIN A1C: Hemoglobin A1C: 10.3

## 2021-09-25 LAB — LIPID PANEL
Cholesterol: 148 (ref 0–200)
HDL: 29 — AB (ref 35–70)
LDL Cholesterol: 55
Triglycerides: 421 — AB (ref 40–160)

## 2021-09-25 LAB — VITAMIN D 25 HYDROXY (VIT D DEFICIENCY, FRACTURES): Vit D, 25-Hydroxy: 19.2

## 2021-09-25 LAB — HEPATIC FUNCTION PANEL
ALT: 29 U/L (ref 7–35)
AST: 42 — AB (ref 13–35)

## 2021-09-25 LAB — BASIC METABOLIC PANEL
BUN: 12 (ref 4–21)
Creatinine: 0.8 (ref 0.5–1.1)

## 2021-09-25 LAB — COMPREHENSIVE METABOLIC PANEL: Calcium: 9.8 (ref 8.7–10.7)

## 2021-09-25 LAB — TSH: TSH: 2.7 (ref 0.41–5.90)

## 2021-10-01 DIAGNOSIS — G4733 Obstructive sleep apnea (adult) (pediatric): Secondary | ICD-10-CM | POA: Diagnosis not present

## 2021-10-03 DIAGNOSIS — K529 Noninfective gastroenteritis and colitis, unspecified: Secondary | ICD-10-CM | POA: Diagnosis not present

## 2021-10-03 DIAGNOSIS — Z6837 Body mass index (BMI) 37.0-37.9, adult: Secondary | ICD-10-CM | POA: Diagnosis not present

## 2021-10-08 DIAGNOSIS — I10 Essential (primary) hypertension: Secondary | ICD-10-CM | POA: Diagnosis not present

## 2021-10-08 DIAGNOSIS — Z0100 Encounter for examination of eyes and vision without abnormal findings: Secondary | ICD-10-CM | POA: Diagnosis not present

## 2021-10-08 DIAGNOSIS — H5200 Hypermetropia, unspecified eye: Secondary | ICD-10-CM | POA: Diagnosis not present

## 2021-10-08 DIAGNOSIS — E7800 Pure hypercholesterolemia, unspecified: Secondary | ICD-10-CM | POA: Diagnosis not present

## 2021-10-08 DIAGNOSIS — E119 Type 2 diabetes mellitus without complications: Secondary | ICD-10-CM | POA: Diagnosis not present

## 2021-10-10 ENCOUNTER — Other Ambulatory Visit: Payer: Self-pay | Admitting: *Deleted

## 2021-10-10 ENCOUNTER — Encounter: Payer: Self-pay | Admitting: *Deleted

## 2021-10-10 NOTE — Patient Outreach (Signed)
[facility]) Care Management Telephonic RN Care Manager Note   10/10/2021 Name:  Rachel Odonnell MRN:  [id number] DOB:  08-08-1956  Summary: Follow up outreach to patient  She had not updated RN CM if she had follow up with external care management staff She confirms today she does not recall receiving a call from the external care management staff (pharmacy nor nurse) and possibly if they did reach out and did not identify themselves she may have assumed it was a Designer, multimedia She agrees for RN CM to send an e-mail to external care management and to forward her information to [facility] staff if no response from external vendor as her coverage changed from [employer] to [facility] for 2023   As an update on her medical concerns she reports she is less short of breath and is able to walk longer distances like in a grocery store but is sleeping more She reports being started on Eliquis and it has been beneficial She reports her pain medications were discontinued and she is noticing she is having intermittent stiffness. She reports being able to only take Tylenol but without relief  She is scheduled for a sleep study October 28 2021 and looking forward to the outcome. ? 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, Adderrall was discontinued by pcp   Recommendations/Changes made from today's visit: Assessed for possible outreach from external care management services (dawn, Kim)   Assessed for worsening symptoms She was encouraged to speak with her MD about her pain changes, speak with her insurance about possible complementary care benefits like massage that may assist with pain also [facility] D[facility]ed [facility] progression with pending case transfer to appropriate care management services Sent e-mail to external care management program    Subjective: Rachel Odonnell is an 66 y.o. year old female who is a primary patient of Royann Shivers, [place]. The care management team was consulted  for assistance with care management and/or care coordination needs.    Telephonic RN Care Manager completed Telephone Visit today.   Objective:  Medications Reviewed Today     Reviewed by Clinton Gallant, RN (Registered Nurse) on 10/10/21 at 1748  Med List Status: <None>   Medication Order Taking? Sig Documenting Provider Last Dose Status Informant  apixaban (ELIQUIS) 5 MG TABS tablet [id number] Yes Take 1 tablet (5 mg total) by mouth 2 (two) times daily. Antoine Poche, MD Taking Active   citalopram (CELEXA) 40 MG tablet [id number]  Take 40 mg by mouth every evening. [provider]  Active Self  cyclobenzaprine (FLEXERIL) 10 MG tablet [id number]  Take by mouth. [provider]  Active   diazepam (VALIUM) 5 MG tablet [id number]  Take by mouth. [provider]  Active   estradiol (ESTRACE) 2 MG tablet [id number]  Take 2 mg by mouth every evening. [provider]  Active Self  EUTHYROX 25 MCG tablet [id number]  Take 25 mcg by mouth daily before breakfast. [provider]  Active Self  fenofibrate (TRICOR) 145 MG tablet [id number]  Take 145 mg by mouth every evening. [provider]  Active Self  fluorometholone (FML) 0.1 % ophthalmic suspension [id number]  Place 1 drop into both eyes 3 (three) times daily as needed (allergy eyes.). [provider]  Active Self  isosorbide mononitrate (IMDUR) 60 MG 24 hr tablet [id number]  Take 1 tablet (60 mg total) by mouth daily. Netta Neat., NP  Active Self  LAGEVRIO 200 MG CAPS  capsule [id number]  Take by mouth. [provider]  Active   levocetirizine (XYZAL) 5 MG tablet [id number]  Take 5 mg by mouth every evening. [provider]  Active Self  metFORMIN (GLUCOPHAGE) 500 MG tablet [id number]  Take 2 tablets (1,000 mg total) by mouth 2 (two) times daily with a meal. [place], Peter M, MD  Active   montelukast (SINGULAIR) 10 MG tablet [id number]  Take 10 mg by mouth at bedtime.  [provider]  Active Self  Omega-3 Fatty Acids (FISH OIL) 1000 MG CAPS [id number]  Take 1,000 mg by mouth every evening. [provider]  Active Self  omeprazole (PRILOSEC) 40 MG capsule [id number]  Take 40 mg by mouth every evening. [provider]  Active Self  rOPINIRole (REQUIP) 2 MG tablet [id number]  Take 2 mg by mouth at bedtime. [provider]  Active Self  rosuvastatin (CRESTOR) 10 MG tablet [id number]  Take 10 mg by mouth every evening. [provider]  Active Self  tirzepatide Greggory Keen) 2.5 MG/0.5ML Pen [id number]  Inject 2.5 mg into the skin every Monday. [provider]  Active              SDOH:  (Social Determinants of Health) assessments and interventions performed:  SDOH Interventions    Flowsheet Row Most Recent Value  SDOH Interventions   Food Insecurity Interventions Intervention Not Indicated  Financial Strain Interventions Intervention Not Indicated  Housing Interventions Intervention Not Indicated  Intimate Partner Violence Interventions Intervention Not Indicated  Stress Interventions Intervention Not Indicated  Social Connections Interventions Intervention Not Indicated  Transportation Interventions Intervention Not Indicated       Care Plan  Review of patient past medical history, allergies, medications, health status, including review of consultants reports, laboratory and other test data, was performed as part of comprehensive evaluation for care management services.   Care Plan : RN Care Manager Plan of Care  Updates made by Clinton Gallant, RN since 10/10/2021 12:00 AM     Problem: CH[facility] AMB "PATIENT-SPECIFIC PROBLEM"Complex Care Coordination Needs and disease management in patient with DM emphysema   Priority: High  Onset Date: 07/30/2021     Long-Range Goal: Establish Plan of Care for Management Complex SDOH Barriers, disease management and Care Coordination Needs in patient with DM,  Emphysema   Start Date: 07/30/2021  This Visit's Progress: On track  Recent Progress: On track  Priority: High  Note:   Current Barriers:  Knowledge Deficits related to plan of care for management of Atrial Fibrillation, COPD, DMII, and emphysema  Care Coordination needs related to Limited education about DM II, COPD/emphysema, Atrial fibrillation* and health behaviors Barriers:Health Behaviors Knowledge   RN CM Clinical Goal(s):  Patient will verbalize understanding of plan for management of DMII and Emphysema as evidenced by improvements and admission prevention  through collaboration with RN Care manager, provider, and care team.  Further outreaches to assess worsening symptoms, care coordination and disease management/education needs Inter-disciplinary care team collaboration (see longitudinal plan of care) Evaluation of current treatment plan related to  self management and patient's adherence to plan as established by provider Discussed her change from [facility] changes [facility] complex program eligibility plus pending external RN CM services 08/22/21 Completed assessments for Afib and copd with education on home management to include her keeping MD appointments, medicines, cardiac/pulmonary rehabs, coumadin clinics discussed 10/10/21 Assessed for possible outreach from external care management services (dawn, Kim)   Assessed for worsening symptoms She was encouraged to speak with  her MD about her pain changes, speak with her insurance about possible complementary care benefits like massage that may assist with pain also [facility] Discussed [facility] progression with pending case transfer to appropriate care management services Sent e-mail to external care management program  COPD Interventions:  (Status:  Goal on track:  Yes.) Long Term Goal Provided patient with basic written and verbal COPD education on self care/management/and exacerbation prevention Advised patient to track  and manage COPD triggers Advised patient to self assesses COPD action plan zone and make appointment with provider if in the yellow zone for 48 hours without improvement Provided education about and advised patient to utilize infection prevention strategies to reduce risk of respiratory infection Discussed the importance of adequate rest and management of fatigue with COPD Screening for signs and symptoms of depression related to chronic disease state  Assessed social determinant of health barriers Reminded her to review COPD EMMI not review by her yet  08/22/21 outreach with her to pulmonologist office to leave a message for RN about pt reported post nasal drainage, green intermittent productive mucus recently- no fever   Diabetes Interventions:  (Status:  Condition stable.  Not addressed this visit.) Long Term Goal Assessed patient's understanding of A1c goal: <7% Provided education to patient about basic DM disease process Reviewed medications with patient and discussed importance of medication adherence Counseled on importance of regular laboratory monitoring as prescribed No results found for: HGBA1C-  06/11/21 HgA1c= 8.9% was 11.1 prior to this per [facility] 08/22/21 cbg during outreach = 176 - sent EMMI DM education   AFIB Interventions: (Status:  Goal on track:  Yes.) Long Term Goal   Reviewed importance of adherence to anticoagulant exactly as prescribed Counseled on avoidance of NSAIDs due to increased bleeding risk with anticoagulants Counseled on importance of regular laboratory monitoring as prescribed Afib action plan reviewed Recommend a diary to determine her individual atrial fib symptoms (she noted she breaks out in a sweat, heart race feels faint then she rests)  Sent EMMI Afib education 08/22/21  Patient Goals/Self-Care Activities: Take all medications as prescribed Attend all scheduled provider appointments Perform all self care activities independently  Perform IADL's  (shopping, preparing meals, housekeeping, managing finances) independently Call provider office for new concerns or questions  begin a symptom diary  Follow Up Plan:  The patient has been provided with contact information for the care management team and has been advised to call with any health related questions or concerns.       Plan: The patient has been provided with contact information for the care management team and has been advised to call with any health related questions or concerns.  The care management team will reach out to the patient again over the next 30+ business days.  Marguerite Barba L. Noelle Penner, RN, BSN, CCM [facility] Telephonic Care Management Care Coordinator Office number [phone] Main [facility] number [phone] Fax number [phone]

## 2021-10-17 DIAGNOSIS — R87612 Low grade squamous intraepithelial lesion on cytologic smear of cervix (LGSIL): Secondary | ICD-10-CM | POA: Diagnosis not present

## 2021-10-17 DIAGNOSIS — Z0000 Encounter for general adult medical examination without abnormal findings: Secondary | ICD-10-CM | POA: Diagnosis not present

## 2021-10-17 DIAGNOSIS — Z113 Encounter for screening for infections with a predominantly sexual mode of transmission: Secondary | ICD-10-CM | POA: Diagnosis not present

## 2021-10-17 DIAGNOSIS — Z6837 Body mass index (BMI) 37.0-37.9, adult: Secondary | ICD-10-CM | POA: Diagnosis not present

## 2021-10-21 DIAGNOSIS — M9903 Segmental and somatic dysfunction of lumbar region: Secondary | ICD-10-CM | POA: Diagnosis not present

## 2021-10-21 DIAGNOSIS — S338XXA Sprain of other parts of lumbar spine and pelvis, initial encounter: Secondary | ICD-10-CM | POA: Diagnosis not present

## 2021-10-21 DIAGNOSIS — M9901 Segmental and somatic dysfunction of cervical region: Secondary | ICD-10-CM | POA: Diagnosis not present

## 2021-10-21 DIAGNOSIS — M9902 Segmental and somatic dysfunction of thoracic region: Secondary | ICD-10-CM | POA: Diagnosis not present

## 2021-10-21 DIAGNOSIS — S233XXA Sprain of ligaments of thoracic spine, initial encounter: Secondary | ICD-10-CM | POA: Diagnosis not present

## 2021-10-21 DIAGNOSIS — S134XXA Sprain of ligaments of cervical spine, initial encounter: Secondary | ICD-10-CM | POA: Diagnosis not present

## 2021-10-27 ENCOUNTER — Ambulatory Visit (HOSPITAL_BASED_OUTPATIENT_CLINIC_OR_DEPARTMENT_OTHER): Payer: Medicare HMO | Attending: Pulmonary Disease | Admitting: Pulmonary Disease

## 2021-10-27 ENCOUNTER — Other Ambulatory Visit: Payer: Self-pay | Admitting: *Deleted

## 2021-10-27 ENCOUNTER — Other Ambulatory Visit: Payer: Self-pay

## 2021-10-27 DIAGNOSIS — Z9989 Dependence on other enabling machines and devices: Secondary | ICD-10-CM | POA: Diagnosis not present

## 2021-10-27 DIAGNOSIS — G4733 Obstructive sleep apnea (adult) (pediatric): Secondary | ICD-10-CM | POA: Insufficient documentation

## 2021-10-27 NOTE — Patient Outreach (Incomplete)
[facility]) Care Management  10/27/2021  SHERLIE BOYUM [birth year] [id number]   [facility] Case closure -connected with external care management services  RN CM received an e-mail update that Mrs Legere is now connected with an external care management services Pt is scheduled to meet with pcp office CRN on 12/17/21 at 1445   Plan [facility] RN CM will close case - connected to external care management  Case closure letters sent to patient and MD  Cala Bradford L. Noelle Penner, RN, BSN, CCM [facility] Telephonic Care Management Care Coordinator Office number [phone] Main [facility] number [phone] Fax number [phone]

## 2021-10-27 NOTE — Patient Outreach (Signed)
[facility]) Care Management ? ?10/27/2021 ? ?Rachel Odonnell ?[birth year] ?[id number] ? ? ?[facility] Case closure -connected with external care management services ? ?RN CM received an e-mail update that Rachel Odonnell is now connected with an external care management services ?Pt is scheduled to meet with pcp office CRN on 12/17/21 at 1445 ? ? ?Plan [facility] RN CM will close case - connected to external care management  ?Case closure letters sent to patient and MD ? ?Care Plan : RN Care Manager Plan of Care  ?Updates made by Clinton Gallant, RN since 10/28/2021 12:00 AM  ?Completed 10/28/2021  ? 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  ? ?Long-Range Goal: Establish Plan of Care for Management Complex SDOH Barriers, disease management and Care Coordination Needs in patient with DM, Emphysema Completed 10/28/2021  ?Start Date: 07/30/2021  ?This Visit's Progress: On track  ?Recent Progress: On track  ?Priority: High  ?Note:   ?Current Barriers:  ?Knowledge Deficits related to plan of care for management of Atrial Fibrillation, COPD, DMII, and emphysema  ?Care Coordination needs related to Limited education about DM II, COPD/emphysema, Atrial fibrillation* and health behaviors ?Barriers:Health Behaviors ?Knowledge ? ? ?RN CM Clinical Goal(s):  ?Patient will verbalize understanding of plan for management of DMII and Emphysema as evidenced by improvements and admission prevention  through collaboration with RN Care manager, provider, and care team.  ?Further outreaches to assess worsening symptoms, care coordination and disease management/education needs ?Inter-disciplinary care team collaboration (see longitudinal plan of care) ?Evaluation of current treatment plan related to  self management and patient's adherence to plan as established by provider ?Discussed her change  from [facility] changes [facility] complex program eligibility plus pending external RN CM services ?08/22/21 Completed assessments for Afib and copd with education on home management to include her keeping MD appointments, medicines, cardiac/pulmonary rehabs, coumadin clinics discussed ?10/10/21 Assessed for possible outreach from external care management services (dawn, Selena Batten)  ? Assessed for worsening symptoms ?She was encouraged to speak with her MD about her pain changes, speak with her insurance about possible complementary care benefits like massage that may assist with pain also ?[facility] ?Discussed [facility] progression with pending case transfer to appropriate care management services ?Sent e-mail to external care management program ?10/27/21 case closure RN CM received an e-mail update that Rachel Odonnell is now connected with an external care management services. Pt is scheduled to meet with pcp office CRN on 12/17/21 at 1445 ? ?COPD Interventions:  (Status:   goal on track, case closure- connected to external care management   ) Long Term Goal ?Provided patient with basic written and verbal COPD education on self care/management/and exacerbation prevention ?Advised patient to track and manage COPD triggers ?Advised patient to self assesses COPD action plan zone and make appointment with provider if in the yellow zone for 48 hours without improvement ?Provided education about and advised patient to utilize infection prevention strategies to reduce risk of respiratory infection ?Discussed the importance of adequate rest and management of fatigue with COPD ?Screening for signs and symptoms of depression related to chronic disease state  ?Assessed social determinant of health barriers ?Reminded her to review COPD EMMI not review by her yet  ?08/22/21 outreach with her to pulmonologist office to leave a message for RN about pt reported post nasal drainage, green intermittent productive mucus recently- no  fever ? ? ?Diabetes Interventions:  (Status:   goal not on track - connected to external care management  ) Long Term Goal ?Assessed patient's understanding of A1c goal: <7% ?Provided education to patient about basic DM disease process ?Reviewed medications with patient and discussed importance of medication adherence ?Counseled on importance of regular laboratory monitoring as prescribed ?No results found for: HGBA1C-  ?06/11/21 HgA1c= 8.9% was 11.1 prior to this per [facility] ?08/22/21 cbg during outreach = 176 - sent EMMI DM education  ? ?AFIB Interventions: (Status:  Goal on track:  Yes.- connected to external care management ) Long Term Goal ?  Reviewed importance of adherence to anticoagulant exactly as prescribed ?Counseled on avoidance of NSAIDs due to increased bleeding risk with anticoagulants ?Counseled on importance of regular laboratory monitoring as prescribed ?Afib action plan reviewed ?Recommend a diary to determine her individual atrial fib symptoms (she noted she breaks out in a sweat, heart race feels faint then she rests)  ?Sent EMMI Afib education 08/22/21 ? ?Patient Goals/Self-Care Activities: ?Take all medications as prescribed ?Attend all scheduled provider appointments ?Perform all self care activities independently  ?Perform IADL's (shopping, preparing meals, housekeeping, managing finances) independently ?Call provider office for new concerns or questions  ?begin a symptom diary ? ?Follow Up Plan:  [facility] RN CM will close case - connected to external care management  ?Case closure letters sent to patient and MD The patient has been provided with contact information for the care management team and has been advised to call with any health related questions or concerns.  ?  ? ? ?Dionte Blaustein L. Noelle Penner, RN, BSN, CCM ?[facility] Telephonic Care Management Care Coordinator ?Office number [phone] ?Main [facility] number [phone] ?Fax number [phone] ? ?

## 2021-10-28 ENCOUNTER — Ambulatory Visit (HOSPITAL_BASED_OUTPATIENT_CLINIC_OR_DEPARTMENT_OTHER): Payer: Medicare HMO | Attending: Pulmonary Disease | Admitting: Pulmonary Disease

## 2021-10-28 DIAGNOSIS — Z9989 Dependence on other enabling machines and devices: Secondary | ICD-10-CM | POA: Insufficient documentation

## 2021-10-28 DIAGNOSIS — G4711 Idiopathic hypersomnia with long sleep time: Secondary | ICD-10-CM | POA: Diagnosis not present

## 2021-10-28 DIAGNOSIS — G4710 Hypersomnia, unspecified: Secondary | ICD-10-CM

## 2021-10-28 DIAGNOSIS — G4733 Obstructive sleep apnea (adult) (pediatric): Secondary | ICD-10-CM | POA: Insufficient documentation

## 2021-10-28 NOTE — Procedures (Signed)
Patient Name: Rachel Odonnell, Rachel Odonnell ?Study Date: 10/27/2021 ?Gender: Female ?D.O.B: [birth year] ?Age (years): 53 ?Referring Provider: Kara Mead MD, ABSM ?Height (inches): 64 ?Interpreting Physician: Kara Mead MD, ABSM ?Weight (lbs): 223 ?RPSGT: Zadie Rhine ?BMI: 38 ?MRN: [id number] ?Neck Size: 15.00 ?<br> <br> ?CLINICAL INFORMATION ?The patient is referred for a CPAP titration to treat sleep apnea. ? ? ? ?Severe OSA was diagnosed in 2022 with AHI 43/hour and maintained on nasal CPAP of 10 cm ? ?SLEEP STUDY TECHNIQUE ?As per the AASM Manual for the Scoring of Sleep and Associated Events v2.3 (April 2016) with a hypopnea requiring 4% desaturations. ? ?The channels recorded and monitored were frontal, central and occipital EEG, electrooculogram (EOG), submentalis EMG (chin), nasal and oral airflow, thoracic and abdominal wall motion, anterior tibialis EMG, snore microphone, electrocardiogram, and pulse oximetry. Continuous positive airway pressure (CPAP) was initiated at the beginning of the study and titrated to treat sleep-disordered breathing. ? ?MEDICATIONS ?Medications self-administered by patient taken the night of the study : eliquis, CITALOPRAM, etradol, FENOFIBRATE, FISH OIL, levocetirizine, METFORMIN, MONTELUKAST, OMEPRAZOLE, ROPINIROLE HCL, rosuvastatin ? ?TECHNICIAN COMMENTS ?Comments added by technician: Pt had one restroom visted. Patient had difficulty initiating sleep. ?Comments added by scorer: N/A ? ? ?RESPIRATORY PARAMETERS ?Optimal PAP Pressure (cm): 13 AHI at Optimal Pressure (/hr): 0 ?Overall Minimal O2 (%): 85.0 Supine % at Optimal Pressure (%): 0 ?Minimal O2 at Optimal Pressure (%): 89.0  ? ?SLEEP ARCHITECTURE ?The study was initiated at 10:50:49 PM and ended at 5:59:30 AM. ? ?Sleep onset time was 17.3 minutes and the sleep efficiency was 70.9%%. The total sleep time was 304 minutes. ? ?The patient spent 6.6%% of the night in stage N1 sleep, 61.0%% in stage N2 sleep, 3.3%% in stage N3 and 29.1% in  REM.Stage REM latency was 125.0 minutes ? ?Wake after sleep onset was 107.4. Alpha intrusion was absent. Supine sleep was 0.00%. ? ?CARDIAC DATA ?The 2 lead EKG demonstrated sinus rhythm. The mean heart rate was 59.4 beats per minute. Other EKG findings include: None. ? ? ?LEG MOVEMENT DATA ?The total Periodic Limb Movements of Sleep (PLMS) were 0. The PLMS index was 0.0. A PLMS index of <15 is considered normal in adults. ? ?IMPRESSIONS ?- The optimal PAP pressure was 13 cm of water. ?- Central sleep apnea was not noted during this titration (CAI = 0.4/h). ?- Moderate oxygen desaturations were observed during this titration (min O2 = 85.0%). ?- No snoring was audible during this study. ?- No cardiac abnormalities were observed during this study. ?- Clinically significant periodic limb movements were not noted during this study. Arousals associated with PLMs were rare. ? ? ?DIAGNOSIS ?- Obstructive Sleep Apnea (G47.33) ? ? ?RECOMMENDATIONS ?- Trial of CPAP therapy on 13 cm H2O with a Small size Resmed Nasal Airfit N20 mask and heated humidification. ?- Avoid alcohol, sedatives and other CNS depressants that may worsen sleep apnea and disrupt normal sleep architecture. ?- Sleep hygiene should be reviewed to assess factors that may improve sleep quality. ?- Weight management and regular exercise should be initiated or continued. ?-Consider MSLT if hypersomnolence persists ?- Return to Sleep Center for re-evaluation after 4 weeks of therapy ? ? ?Kara Mead MD ?Board Certified in Sleep medicine ? ?

## 2021-10-28 NOTE — Procedures (Deleted)
? ? ?  NAME: Rachel Odonnell ?DATE OF BIRTH:  19-Jul-1956 ?MEDICAL RECORD NUMBER [id number]  ?LOCATION: [facility]  ?PHYSICIAN: [facility]. Kajah Santizo  ?DATE OF STUDY: 10/27/2021 ? ?SLEEP STUDY TYPE: Out of Center Sleep Test ?              ? ?REFERRING PHYSICIAN: Oretha Milch, MD ? ?INDICATION FOR STUDY: *** ? ?EPWORTH SLEEPINESS SCORE:   ?HEIGHT: 5\' 4"  (162.6 cm)  ?WEIGHT: 223 lb (101.2 kg)    Body mass index is 38.28 kg/m?.  ?NECK SIZE: 15 in. ? ?MEDICATIONS: *** ? ?IMPRESSION:  *** ?   ?RECOMMENDATION:  ***  ? ? Rachel Odonnell ?Diplomate, [employer] ? ?ELECTRONICALLY SIGNED ON:  10/28/2021, 5:00 PM ?[facility] ?PH: [phone] 10/30/2021   FX: [phone] ?ACCREDITED BY THE AMERICAN ACADEMY OF SLEEP MEDICINE ? ?

## 2021-11-04 DIAGNOSIS — J309 Allergic rhinitis, unspecified: Secondary | ICD-10-CM | POA: Diagnosis not present

## 2021-11-04 DIAGNOSIS — H16139 Photokeratitis, unspecified eye: Secondary | ICD-10-CM | POA: Diagnosis not present

## 2021-11-04 DIAGNOSIS — F419 Anxiety disorder, unspecified: Secondary | ICD-10-CM | POA: Diagnosis not present

## 2021-11-04 DIAGNOSIS — K219 Gastro-esophageal reflux disease without esophagitis: Secondary | ICD-10-CM | POA: Diagnosis not present

## 2021-11-04 DIAGNOSIS — E785 Hyperlipidemia, unspecified: Secondary | ICD-10-CM | POA: Diagnosis not present

## 2021-11-04 DIAGNOSIS — E039 Hypothyroidism, unspecified: Secondary | ICD-10-CM | POA: Diagnosis not present

## 2021-11-04 DIAGNOSIS — E1165 Type 2 diabetes mellitus with hyperglycemia: Secondary | ICD-10-CM | POA: Diagnosis not present

## 2021-11-04 DIAGNOSIS — G4733 Obstructive sleep apnea (adult) (pediatric): Secondary | ICD-10-CM | POA: Diagnosis not present

## 2021-11-05 DIAGNOSIS — G47411 Narcolepsy with cataplexy: Secondary | ICD-10-CM

## 2021-11-05 DIAGNOSIS — G4710 Hypersomnia, unspecified: Secondary | ICD-10-CM | POA: Diagnosis not present

## 2021-11-05 DIAGNOSIS — Z9989 Dependence on other enabling machines and devices: Secondary | ICD-10-CM | POA: Diagnosis not present

## 2021-11-05 DIAGNOSIS — G4733 Obstructive sleep apnea (adult) (pediatric): Secondary | ICD-10-CM | POA: Diagnosis not present

## 2021-11-05 NOTE — Procedures (Signed)
Patient Name: Rachel, Odonnell ?Study Date: 10/28/2021 ?Gender: Female ?D.O.B: 23-Feb-1956 ?Age (years): 40 ?Referring Provider: Kara Mead MD, ABSM ?Height (inches): 64 ?Interpreting Physician: Kara Mead MD, ABSM ?Weight (lbs): 223 ?RPSGT: Jacolyn Reedy ?BMI: 38 ?MRN: [id number] ?Neck Size: 15.00 ?<br> <br> ?CLINICAL INFORMATION ?Sleep Study Type: MSLT ? ? ? ?The patient was referred to the sleep center for evaluation of daytime sleepiness. Persistent hypersomnolence in spite of CPAP use. Previous diagnosis of narcolepsy ? ?Epworth Sleepiness Score: 20 ? ?SLEEP STUDY TECHNIQUE ?A Multiple Sleep Latency Test was performed after an overnight polysomnogram according to the AASM scoring manual v2.3 (April 2016) and clinical guidelines. Five nap opportunities occurred over the course of the test which followed an overnight polysomnogram. The channels recorded and monitored were frontal, central, and occipital electroencephalography (EEG), right and left electrooculogram (EOG), chin electromyography (EMG), and electrocardiogram (EKG). ? ?MEDICATIONS ?Medications taken by the patient : eliquis, CITALOPRAM, etradol, FENOFIBRATE, FISH OIL, levocetirizine, METFORMIN, MONTELUKAST, OMEPRAZOLE, ROPINIROLE HCL, rosuvastatin ?Medications administered by patient during sleep study : No sleep medicine administered. ? ? ?IMPRESSIONS ?- Total number of naps attempted: 5 . Total number of naps with sleep attained: 5. The Mean Sleep Latency was 2 minutes 50 secs. There were no sleep-onset REM periods. ?- The patient appears to have pathologic sleepiness, evidenced by a short mean sleep latency (8 minutes or less) on this MSLT. ? ? 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 ? ?RECOMMENDATIONS ?- Return for follow up and management of Idiopathic Hypersomnia. ?- Return for follow up to evaluate other causes of excessive daytime sleepiness such as medications. ? ? ?Kara Mead MD ?Board Certified in Sleep  medicine ? ?

## 2021-11-06 DIAGNOSIS — Z6837 Body mass index (BMI) 37.0-37.9, adult: Secondary | ICD-10-CM | POA: Diagnosis not present

## 2021-11-06 DIAGNOSIS — R87612 Low grade squamous intraepithelial lesion on cytologic smear of cervix (LGSIL): Secondary | ICD-10-CM | POA: Diagnosis not present

## 2021-11-06 DIAGNOSIS — N870 Mild cervical dysplasia: Secondary | ICD-10-CM | POA: Diagnosis not present

## 2021-11-06 DIAGNOSIS — R87619 Unspecified abnormal cytological findings in specimens from cervix uteri: Secondary | ICD-10-CM | POA: Diagnosis not present

## 2021-11-11 DIAGNOSIS — M9902 Segmental and somatic dysfunction of thoracic region: Secondary | ICD-10-CM | POA: Diagnosis not present

## 2021-11-11 DIAGNOSIS — S134XXA Sprain of ligaments of cervical spine, initial encounter: Secondary | ICD-10-CM | POA: Diagnosis not present

## 2021-11-11 DIAGNOSIS — S233XXA Sprain of ligaments of thoracic spine, initial encounter: Secondary | ICD-10-CM | POA: Diagnosis not present

## 2021-11-11 DIAGNOSIS — M9903 Segmental and somatic dysfunction of lumbar region: Secondary | ICD-10-CM | POA: Diagnosis not present

## 2021-11-11 DIAGNOSIS — S338XXA Sprain of other parts of lumbar spine and pelvis, initial encounter: Secondary | ICD-10-CM | POA: Diagnosis not present

## 2021-11-11 DIAGNOSIS — M9901 Segmental and somatic dysfunction of cervical region: Secondary | ICD-10-CM | POA: Diagnosis not present

## 2021-11-17 ENCOUNTER — Encounter: Payer: Self-pay | Admitting: Neurology

## 2021-11-17 ENCOUNTER — Ambulatory Visit (INDEPENDENT_AMBULATORY_CARE_PROVIDER_SITE_OTHER): Payer: Medicare HMO | Admitting: Neurology

## 2021-11-17 VITALS — BP 117/58 | HR 66 | Ht 64.0 in | Wt 218.2 lb

## 2021-11-17 DIAGNOSIS — R251 Tremor, unspecified: Secondary | ICD-10-CM

## 2021-11-17 DIAGNOSIS — G4733 Obstructive sleep apnea (adult) (pediatric): Secondary | ICD-10-CM

## 2021-11-17 DIAGNOSIS — G4730 Sleep apnea, unspecified: Secondary | ICD-10-CM | POA: Insufficient documentation

## 2021-11-17 DIAGNOSIS — R413 Other amnesia: Secondary | ICD-10-CM | POA: Diagnosis not present

## 2021-11-17 MED ORDER — GABAPENTIN 100 MG PO CAPS: 100.0000 mg | ORAL_CAPSULE | Freq: Three times a day (TID) | ORAL | 11 refills | Status: DC

## 2021-11-17 NOTE — Progress Notes (Signed)
? ?Chief Complaint  ?Patient presents with  ? New Patient (Initial Visit)  ?  Rm 15. Alone. ?NP/paper proficient/Rachel Odonnell [facility]Tremors. ?States when she drives her right hand has tremors. C/o tremors when holding up a menu. C/o head tremors. Tremors began in left hand, now moving to right.  ? ? ? ? ?ASSESSMENT AND PLAN ? ?Rachel Odonnell is a 66 y.o. female   ?Cognitive impairment ? MoCA examination 21/30 ? MRI of brain ? laboratory evaluation from primary care physician ?Excessive daytime sleepiness, fatigue, ? Known history of obstructive sleep apnea, on CPAP, ? Sleep study in March 2023 showed findings consistent with obstructive sleep apnea, that was corrected by CPAP, mean sleep latency was 2 minutes 50 seconds, consistent with idiopathic hypersomnia ? ?Tremor, ? Mild action tremor, involving both hands, ? 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 Needs laboratory evaluation TSH if has not been done in 6 months ?  ?Restless leg symptoms ? On Requip, which can cause excessive sleepiness as well, she denies significant difficulty falling to sleep, stop Requip, gabapentin 100 mg as needed ? ?DIAGNOSTIC DATA (LABS, IMAGING, TESTING) ?- I reviewed patient records, labs, notes, testing and imaging myself where available. ? ? ?MEDICAL HISTORY: ? ?Rachel Odonnell is a 66 year old female, seen in request by her primary care PA Rachel Odonnell for evaluation of bilateral hands tremor, excessive daytime sleepiness, fatigue, initial evaluation was on November 17, 2021 ? ?I reviewed and summarized the referring note. PMHX. ?DM ?HTN ?CAD ?Atrial fibrillation, ?OSA-CPAP ? ?She denies family history of tremor, around September 2022 she noticed left hand action tremor, shaky when she holds up her left hand, recent involvement of right hand, ? ?She had long history of obesity, obstructive sleep apnea, using CPAP machine, complains of increased sleepiness since 2022, drifting to  sleep during daytime, sedation, ? ?She had repeat sleep study in March 2023 by Rachel Odonnell, reported obstructive sleep apnea corrected by CPAP, idiopathic hypersomnia, sleep latency was 2 minutes 50 seconds on any sleep latency test ? ?She also concerned about memory loss, she used to work at jail, complains of nightmares, now has improved after retirement ? ?Echocardiogram in December 2022: Ejection fraction 60 to 65%, no significant valvular pathology ? ?Cardiac catheter in October 2022, mild to moderate nonobstructive coronary artery disease, ? ?Laboratory evaluations in Oct 2022, A1c was 8.9, CMP, glucose 364, creat 0.75,  AST 59, ALT 43, hg 14.8, TSH,  Triglyceride 395, ? ?PHYSICAL EXAM: ?  ?Vitals:  ? 11/17/21 1436  ?BP: (!) 117/58  ?Pulse: 66  ?Weight: 218 lb 3.2 oz (99 kg)  ?Height: 5\' 4"  (1.626 m)  ? ?Not recorded ?  ? ? ?Body mass index is 37.45 kg/m?. ? ?PHYSICAL EXAMNIATION: ? ?Gen: NAD, conversant, well nourised, well groomed                     ?Cardiovascular: Regular rate rhythm, no peripheral edema, warm, nontender. ?Eyes: Conjunctivae clear without exudates or hemorrhage ?Neck: Supple, no carotid bruits. ?Pulmonary: Clear to auscultation bilaterally  ? ?NEUROLOGICAL EXAM: ? ?MENTAL STATUS: ?Speech: ?   Speech is normal; fluent and spontaneous with normal comprehension.  ?Cognition: ?  ? ?  11/17/2021  ?  3:18 PM  ?Montreal Cognitive Assessment   ?Visuospatial/ Executive (0/5) 4  ?Naming (0/3) 3  ?Attention: Read list of digits (0/2) 1  ?Attention: Read list of letters (0/1) 1  ?Attention: Serial 7 subtraction starting at 100 (0/3) 3  ?  Language: Repeat phrase (0/2) 2  ?Language : Fluency (0/1) 0  ?Abstraction (0/2) 1  ?Delayed Recall (0/5) 1  ?Orientation (0/6) 5  ?Total 21 21  ?Adjusted Score (based on education) 22  ?  ?CRANIAL NERVES: ?CN II: Visual fields are full to confrontation. Pupils are round equal and briskly reactive to light. ?CN III, IV, VI: extraocular movement are normal. No ptosis. ?CN V:  Facial sensation is intact to light touch ?CN VII: Face is symmetric with normal eye closure  ?CN VIII: Hearing is normal to causal conversation. ?CN IX, X: Phonation is normal. ?CN XI: Head turning and shoulder shrug are intact ?CN XII: Narrow oropharyngeal space ? ?MOTOR: Normal strength, mild bilateral hands tremor, mild shaky when drawing spiral circle ? ?REFLEXES: ?Reflexes are 2+ and symmetric at the biceps, triceps, knees, and absent ankles. Plantar responses are flexor. ? ?SENSORY: ?Intact to light touch, pinprick and vibratory sensation are intact in fingers and toes. ? ?COORDINATION: ?There is no trunk or limb dysmetria noted. ? ?GAIT/STANCE: Need push-up to get up from seated position, steady ? ?REVIEW OF SYSTEMS:  ?Full 14 system review of systems performed and notable only for as above ?All other review of systems were negative. ? ? ?ALLERGIES: ?Allergies  ?Allergen Reactions  ? Azithromycin Hives  ? Codeine Nausea Only  ? Atomoxetine Nausea And Vomiting  ? Atorvastatin Other (See Comments)  ?  Restless legs  ? Lisinopril Nausea And Vomiting  ? ? ?HOME MEDICATIONS: ?Current Outpatient Medications  ?Medication Sig Dispense Refill  ? apixaban (ELIQUIS) 5 MG TABS tablet Take 1 tablet (5 mg total) by mouth 2 (two) times daily. 60 tablet 6  ? citalopram (CELEXA) 40 MG tablet Take 40 mg by mouth every evening.    ? cyclobenzaprine (FLEXERIL) 10 MG tablet Take by mouth.    ? diazepam (VALIUM) 5 MG tablet Take by mouth.    ? estradiol (ESTRACE) 2 MG tablet Take 2 mg by mouth every evening.    ? EUTHYROX 25 MCG tablet Take 25 mcg by mouth daily before breakfast.    ? fenofibrate (TRICOR) 145 MG tablet Take 145 mg by mouth every evening.    ? fluorometholone (FML) 0.1 % ophthalmic suspension Place 1 drop into both eyes 3 (three) times daily as needed (allergy eyes.).    ? glipiZIDE (GLUCOTROL) 2.5 mg TABS tablet Take 2.5 mg by mouth daily before breakfast.    ? isosorbide mononitrate (IMDUR) 60 MG 24 hr tablet  Take 1 tablet (60 mg total) by mouth daily. 90 tablet 3  ? LAGEVRIO 200 MG CAPS capsule Take by mouth.    ? levocetirizine (XYZAL) 5 MG tablet Take 5 mg by mouth every evening.    ? metFORMIN (GLUCOPHAGE) 500 MG tablet Take 2 tablets (1,000 mg total) by mouth 2 (two) times daily with a meal.    ? montelukast (SINGULAIR) 10 MG tablet Take 10 mg by mouth at bedtime.    ? Omega-3 Fatty Acids (FISH OIL) 1000 MG CAPS Take 1,000 mg by mouth every evening.    ? omeprazole (PRILOSEC) 40 MG capsule Take 40 mg by mouth every evening.    ? rOPINIRole (REQUIP) 2 MG tablet Take 2 mg by mouth at bedtime.    ? rosuvastatin (CRESTOR) 10 MG tablet Take 10 mg by mouth every evening.    ? ?No current facility-administered medications for this visit.  ? ? ?PAST MEDICAL HISTORY: ?Past Medical History:  ?Diagnosis Date  ? ADD (attention deficit disorder)   ?  Diabetes mellitus without complication ([place])   ? Hypertension   ? ? ?PAST SURGICAL HISTORY: ?Past Surgical History:  ?Procedure Laterality Date  ? INCONTINENCE SURGERY    ? RIGHT/LEFT HEART CATH AND CORONARY ANGIOGRAPHY N/A 06/13/2021  ? Procedure: RIGHT/LEFT HEART CATH AND CORONARY ANGIOGRAPHY;  Surgeon: Martinique, Peter M, MD;  Location: [facility];  Service: Cardiovascular;  Laterality: N/A;  ? ? ?FAMILY HISTORY: ?Family History  ?Problem Relation Age of Onset  ? Heart Problems Father   ? COPD Father   ? Emphysema Father   ? Heart block Father   ? Congestive Heart Failure Father   ? ? ?SOCIAL HISTORY: ?Social History  ? ?Socioeconomic History  ? Marital status: Married  ?  Spouse name: Milbert Coulter  ? Number of children: Not on file  ? Years of education: Not on file  ? Highest education level: Not on file  ?Occupational History  ? Not on file  ?Tobacco Use  ? Smoking status: Former  ?  Packs/day: 2.00  ?  Types: Cigarettes  ?  Quit date: 08/17/1994  ?  Years since quitting: 27.2  ? Smokeless tobacco: Never  ?Substance and Sexual Activity  ? Alcohol use: No  ? Drug use: No  ? Sexual  activity: Not on file  ?Other Topics Concern  ? Not on file  ?Social History Narrative  ? Lives at home with husband, Milbert Coulter  ? Former smoker Smoked 2 ppd x 25 yrs; Quit in 1996  ? Previous worked in the [employer]

## 2021-11-17 NOTE — Patient Instructions (Signed)
Stop Requip ? ?Gabapentin 100mg  1-3 tab as needed for restless leg symptom ?

## 2021-11-18 ENCOUNTER — Telehealth: Payer: Self-pay | Admitting: Neurology

## 2021-11-18 NOTE — Telephone Encounter (Signed)
Rachel Odonnell: [id number] (exp. 11/18/21 to 12/18/21) & Rachel Odonnell: [id number] (exp. 11/18/21 to 12/17/21) order sent to GI . They will reach out to the patient to schedule.  ?

## 2021-11-29 ENCOUNTER — Ambulatory Visit
Admission: RE | Admit: 2021-11-29 | Discharge: 2021-11-29 | Disposition: A | Discharge status: Home / Self Care | Payer: Medicare HMO | Source: Ambulatory Visit | Attending: Neurology | Admitting: Neurology

## 2021-11-29 DIAGNOSIS — R413 Other amnesia: Secondary | ICD-10-CM

## 2021-11-29 DIAGNOSIS — R251 Tremor, unspecified: Secondary | ICD-10-CM

## 2021-12-01 ENCOUNTER — Telehealth: Payer: Self-pay | Admitting: Neurology

## 2021-12-01 NOTE — Telephone Encounter (Signed)
I called patient to discuss. No answer, left a message asking her to call us back. 

## 2021-12-01 NOTE — Telephone Encounter (Signed)
I spoke to the patient about the MRI results. She verbalized understanding of the findings.  ?

## 2021-12-01 NOTE — Telephone Encounter (Signed)
Please call patient, MRI of the brain showed generalized atrophy, more than age age-appropriate, also mild supratentorium small vessel disease ? ?Continue take medication, keep follow-up visit ? ?IMPRESSION: This MRI of the brain without contrast shows the following: ?1.   Mild generalized cortical atrophy that is more pronounced in the medial temporal lobes.  Though not always specific, this pattern can be seen with Alzheimer's disease.  Atrophy was not present on the CT scan from 07/09/2012. ?2.   Some scattered T2/FLAIR hyperintense foci in the hemispheres consistent with mild chronic microvascular ischemic change, typical for age.  None of these foci appear to be acute. ?3.   No acute findings ?  ?

## 2021-12-02 DIAGNOSIS — Z1231 Encounter for screening mammogram for malignant neoplasm of breast: Secondary | ICD-10-CM | POA: Diagnosis not present

## 2021-12-04 DIAGNOSIS — M9901 Segmental and somatic dysfunction of cervical region: Secondary | ICD-10-CM | POA: Diagnosis not present

## 2021-12-04 DIAGNOSIS — S233XXA Sprain of ligaments of thoracic spine, initial encounter: Secondary | ICD-10-CM | POA: Diagnosis not present

## 2021-12-04 DIAGNOSIS — M9903 Segmental and somatic dysfunction of lumbar region: Secondary | ICD-10-CM | POA: Diagnosis not present

## 2021-12-04 DIAGNOSIS — S338XXA Sprain of other parts of lumbar spine and pelvis, initial encounter: Secondary | ICD-10-CM | POA: Diagnosis not present

## 2021-12-04 DIAGNOSIS — M9902 Segmental and somatic dysfunction of thoracic region: Secondary | ICD-10-CM | POA: Diagnosis not present

## 2021-12-04 DIAGNOSIS — S134XXA Sprain of ligaments of cervical spine, initial encounter: Secondary | ICD-10-CM | POA: Diagnosis not present

## 2021-12-05 DIAGNOSIS — E1165 Type 2 diabetes mellitus with hyperglycemia: Secondary | ICD-10-CM | POA: Diagnosis not present

## 2021-12-05 DIAGNOSIS — R42 Dizziness and giddiness: Secondary | ICD-10-CM | POA: Diagnosis not present

## 2021-12-05 DIAGNOSIS — Z885 Allergy status to narcotic agent status: Secondary | ICD-10-CM | POA: Diagnosis not present

## 2021-12-05 DIAGNOSIS — R519 Headache, unspecified: Secondary | ICD-10-CM | POA: Diagnosis not present

## 2021-12-11 ENCOUNTER — Encounter: Payer: Self-pay | Admitting: *Deleted

## 2021-12-11 ENCOUNTER — Ambulatory Visit (INDEPENDENT_AMBULATORY_CARE_PROVIDER_SITE_OTHER): Payer: Medicare HMO | Admitting: Cardiology

## 2021-12-11 ENCOUNTER — Encounter: Payer: Self-pay | Admitting: Cardiology

## 2021-12-11 VITALS — BP 136/78 | HR 55 | Ht 64.0 in | Wt 219.0 lb

## 2021-12-11 DIAGNOSIS — I4891 Unspecified atrial fibrillation: Secondary | ICD-10-CM

## 2021-12-11 DIAGNOSIS — D6869 Other thrombophilia: Secondary | ICD-10-CM | POA: Diagnosis not present

## 2021-12-11 DIAGNOSIS — I2510 Atherosclerotic heart disease of native coronary artery without angina pectoris: Secondary | ICD-10-CM | POA: Diagnosis not present

## 2021-12-11 DIAGNOSIS — E782 Mixed hyperlipidemia: Secondary | ICD-10-CM

## 2021-12-11 DIAGNOSIS — R0609 Other forms of dyspnea: Secondary | ICD-10-CM

## 2021-12-11 NOTE — Patient Instructions (Addendum)

## 2021-12-11 NOTE — Progress Notes (Signed)
? ? ? ?Clinical Summary ?[name] is a 66 y.o.female ? ?Chest pain/DOE/CAD ?- Nuclear stress test on 03/11/2018 was normal ?  ?  ?02/2021 DSE [facility]: baseline LVEF 60-65%, normal RV, mild AI. No significant valve pathology ?05/2021 RHC/LHC: mild to mod nonobstructive CAD. LVEDP 11, mean PA 22, PCWP 2, CI 2.3 ?  ?  ?07/2021 PFTs: some restriction likely secondary to body habitus ?- no recent chest pains. Rare infrequent jaw pains.  ?  ?  ?2. CAD ?- 05/2021 RHC/LHC: mild to mod nonobstructive CAD. LVEDP 11, mean PA 22, PCWP 2, CI 2.3 ?  ?Rare infrequent jaw pains. Improved on imdur.  ?  ?  ?3. Afib ?- no palpitations ?- no bleeding on eliquis.  ?- has not required av nodal agent ?  ?  ?4. OSA ?- on cpap, followed by Dr Vassie Loll ? ? ?  ?5. Hyperlipidemia ?- she is on crestor 10mg , fenofirbate.  ?- labs folliowed by pcp ?Past Medical History:  ?Diagnosis Date  ? ADD (attention deficit disorder)   ? Diabetes mellitus without complication ([facility])   ? Hypertension   ? ? ? ?Allergies  ?Allergen Reactions  ? Azithromycin Hives  ? Codeine Nausea Only  ? Atomoxetine Nausea And Vomiting  ? Atorvastatin Other (See Comments)  ?  Restless legs  ? Lisinopril Nausea And Vomiting  ? ? ? ?Current Outpatient Medications  ?Medication Sig Dispense Refill  ? apixaban (ELIQUIS) 5 MG TABS tablet Take 1 tablet (5 mg total) by mouth 2 (two) times daily. 60 tablet 6  ? citalopram (CELEXA) 40 MG tablet Take 40 mg by mouth every evening.    ? estradiol (ESTRACE) 2 MG tablet Take 2 mg by mouth every evening.    ? EUTHYROX 25 MCG tablet Take 25 mcg by mouth daily before breakfast.    ? fenofibrate (TRICOR) 145 MG tablet Take 145 mg by mouth every evening.    ? fluorometholone (FML) 0.1 % ophthalmic suspension Place 1 drop into both eyes 3 (three) times daily as needed (allergy eyes.).    ? gabapentin (NEURONTIN) 100 MG capsule Take 1 capsule (100 mg total) by mouth 3 (three) times daily. 90 capsule 11  ? glipiZIDE (GLUCOTROL) 2.5 mg TABS tablet Take 2.5 mg  by mouth daily before breakfast.    ? isosorbide mononitrate (IMDUR) 60 MG 24 hr tablet Take 1 tablet (60 mg total) by mouth daily. 90 tablet 3  ? LAGEVRIO 200 MG CAPS capsule Take by mouth.    ? levocetirizine (XYZAL) 5 MG tablet Take 5 mg by mouth every evening.    ? metFORMIN (GLUCOPHAGE) 500 MG tablet Take 2 tablets (1,000 mg total) by mouth 2 (two) times daily with a meal.    ? montelukast (SINGULAIR) 10 MG tablet Take 10 mg by mouth at bedtime.    ? Omega-3 Fatty Acids (FISH OIL) 1000 MG CAPS Take 1,000 mg by mouth every evening.    ? omeprazole (PRILOSEC) 40 MG capsule Take 40 mg by mouth every evening.    ? rosuvastatin (CRESTOR) 10 MG tablet Take 10 mg by mouth every evening.    ? ?No current facility-administered medications for this visit.  ? ? ? ?Past Surgical History:  ?Procedure Laterality Date  ? INCONTINENCE SURGERY    ? RIGHT/LEFT HEART CATH AND CORONARY ANGIOGRAPHY N/A 06/13/2021  ? Procedure: RIGHT/LEFT HEART CATH AND CORONARY ANGIOGRAPHY;  Surgeon: 06/15/2021, Peter M, MD;  Location: [facility];  Service: Cardiovascular;  Laterality: N/A;  ? ? ? ?  Allergies  ?Allergen Reactions  ? Azithromycin Hives  ? Codeine Nausea Only  ? Atomoxetine Nausea And Vomiting  ? Atorvastatin Other (See Comments)  ?  Restless legs  ? Lisinopril Nausea And Vomiting  ? ? ? ? ?Family History  ?Problem Relation Age of Onset  ? Heart Problems Father   ? COPD Father   ? Emphysema Father   ? Heart block Father   ? Congestive Heart Failure Father   ? ? ? ?Social History ?[name] reports that she quit smoking about 27 years ago. Her smoking use included cigarettes. She smoked an average of 2 packs per day. She has never used smokeless tobacco. ?[name] reports no history of alcohol use. ? ? ?Review of Systems ?CONSTITUTIONAL: No weight loss, fever, chills, weakness or fatigue.  ?HEENT: Eyes: No visual loss, blurred vision, double vision or yellow sclerae.No hearing loss, sneezing, congestion, runny nose or sore throat.   ?SKIN: No rash or itching.  ?CARDIOVASCULAR: per hpi ?RESPIRATORY: No shortness of breath, cough or sputum.  ?GASTROINTESTINAL: No anorexia, nausea, vomiting or diarrhea. No abdominal pain or blood.  ?GENITOURINARY: No burning on urination, no polyuria ?NEUROLOGICAL: No headache, dizziness, syncope, paralysis, ataxia, numbness or tingling in the extremities. No change in bowel or bladder control.  ?MUSCULOSKELETAL: No muscle, back pain, joint pain or stiffness.  ?LYMPHATICS: No enlarged nodes. No history of splenectomy.  ?PSYCHIATRIC: No history of depression or anxiety.  ?ENDOCRINOLOGIC: No reports of sweating, cold or heat intolerance. No polyuria or polydipsia.  ?. ? ? ?Physical Examination ?Today's Vitals  ? 12/11/21 1451  ?BP: 136/78  ?Pulse: (!) 55  ?SpO2: 95%  ?Weight: 219 lb (99.3 kg)  ?Height: 5\' 4"  (1.626 m)  ? ?Body mass index is 37.59 kg/m?. ? ?Gen: resting comfortably, no acute distress ?HEENT: no scleral icterus, pupils equal round and reactive, no palptable cervical adenopathy,  ?CV: RRR, 2/6 systolic murmur apex ?Resp: Clear to auscultation bilaterally ?GI: abdomen is soft, non-tender, non-distended, normal bowel sounds, no hepatosplenomegaly ?MSK: extremities are warm, no edema.  ?Skin: warm, no rash ?Neuro:  no focal deficits ?Psych: appropriate affect ? ? ?Diagnostic Studies ?05/2021 RHC/LHC ?  Prox LAD to Mid LAD lesion is 45% stenosed. ?  1st Mrg lesion is 60% stenosed. ?  Prox RCA to Mid RCA lesion is 25% stenosed. ?  The left ventricular systolic function is normal. ?  LV end diastolic pressure is normal. ?  The left ventricular ejection fraction is 55-65% by visual estimate. ?  Hemodynamic findings consistent with pulmonary hypertension. ?  ?Nonobstructive CAD ?Normal LV filling pressures.  ?Normal right heart pressures ?Normal cardiac output.  ?  ?Plan: I do not see a cardiac cause for her symptoms. Would recommend medical therapy and risk factor modification for her CAD. ?  ?04/2021  monitor ?12 day monitor ?Rare supraventricular ectopy in the form of isolated PACs, couplets, triplets. Fourteen episodes of SVT longest 8 beats ?Rare ventricular ectopy in the form of isolated PVCs, couplets. Four episodes of NSVT longest 15 beats. ?Reported symptoms correlated with sinus rhythm, PACs, PVCs, and afib ?Episodes of afib <1% burden, rates were controlled ?Single nocturnal pause 3.1 seconds ? ? ? ?Assessment and Plan  ? ?1.DOE/SOB ?- overall benign cardiac workup with echo and RHC/LHC ?- no further cardiac testing indicated ?  ?2. Afib/acquired thrombophilia ?No symptoms ?- has not required av nodal agents ?- continue eliquis for stroke prevention ?  ?3. CAD ?- mild to moderate by recent cath ?-  jaw pain has improved with imdur, perhaps some vasospasm or microvasc disease ?- continue current meds ?  ?4. Hyperlipidemia ?- request pcp labs, continue current meds ? ? ?F/u 1 year ? ?Antoine PocheJonathan F. Jaquin Coy, M.D. ?

## 2021-12-15 ENCOUNTER — Ambulatory Visit: Payer: Medicare HMO | Admitting: "Endocrinology

## 2021-12-16 ENCOUNTER — Ambulatory Visit (INDEPENDENT_AMBULATORY_CARE_PROVIDER_SITE_OTHER): Payer: Medicare HMO | Admitting: "Endocrinology

## 2021-12-16 ENCOUNTER — Encounter: Payer: Self-pay | Admitting: "Endocrinology

## 2021-12-16 ENCOUNTER — Telehealth: Payer: Self-pay | Admitting: "Endocrinology

## 2021-12-16 VITALS — BP 108/52 | HR 56 | Ht 64.0 in | Wt 220.2 lb

## 2021-12-16 DIAGNOSIS — Z6837 Body mass index (BMI) 37.0-37.9, adult: Secondary | ICD-10-CM | POA: Diagnosis not present

## 2021-12-16 DIAGNOSIS — E782 Mixed hyperlipidemia: Secondary | ICD-10-CM | POA: Diagnosis not present

## 2021-12-16 DIAGNOSIS — E559 Vitamin D deficiency, unspecified: Secondary | ICD-10-CM

## 2021-12-16 DIAGNOSIS — E039 Hypothyroidism, unspecified: Secondary | ICD-10-CM

## 2021-12-16 DIAGNOSIS — I10 Essential (primary) hypertension: Secondary | ICD-10-CM | POA: Diagnosis not present

## 2021-12-16 DIAGNOSIS — G4730 Sleep apnea, unspecified: Secondary | ICD-10-CM

## 2021-12-16 DIAGNOSIS — K760 Fatty (change of) liver, not elsewhere classified: Secondary | ICD-10-CM | POA: Diagnosis not present

## 2021-12-16 DIAGNOSIS — E1159 Type 2 diabetes mellitus with other circulatory complications: Secondary | ICD-10-CM | POA: Diagnosis not present

## 2021-12-16 DIAGNOSIS — E66812 Obesity, class 2: Secondary | ICD-10-CM

## 2021-12-16 MED ORDER — CONTOUR TEST VI STRP: ORAL_STRIP | 2 refills | Status: DC

## 2021-12-16 MED ORDER — VITAMIN D3 125 MCG (5000 UT) PO CAPS: 5000.0000 [IU] | ORAL_CAPSULE | Freq: Every day | ORAL | 0 refills | Status: AC

## 2021-12-16 NOTE — Patient Instructions (Signed)

## 2021-12-16 NOTE — Telephone Encounter (Signed)
Pt is asking if she could eat fish or bread? Please advise ?

## 2021-12-16 NOTE — Progress Notes (Signed)
? ?                                                             Endocrinology Consult Note  ?     12/16/2021, 12:11 PM ? ? ?Subjective:  ? ? Patient ID: Rachel Odonnell, female    DOB: 12-28-1955.  ?Rachel Odonnell is being seen in consultation for management of currently uncontrolled symptomatic diabetes requested by  Royann Shivers, PA-C. ? ? ?Past Medical History:  ?Diagnosis Date  ? ADD (attention deficit disorder)   ? Diabetes mellitus without complication (HCC)   ? Hypertension   ? ? ?Past Surgical History:  ?Procedure Laterality Date  ? ABDOMINAL HYSTERECTOMY    ? EXCISIONAL HEMORRHOIDECTOMY    ? INCONTINENCE SURGERY    ? RIGHT/LEFT HEART CATH AND CORONARY ANGIOGRAPHY N/A 06/13/2021  ? Procedure: RIGHT/LEFT HEART CATH AND CORONARY ANGIOGRAPHY;  Surgeon: Swaziland, Peter M, MD;  Location: [facility] INVASIVE CV LAB;  Service: Cardiovascular;  Laterality: N/A;  ? ? ?Social History  ? ?Socioeconomic History  ? Marital status: Married  ?  Spouse name: Shon Hale  ? Number of children: Not on file  ? Years of education: Not on file  ? Highest education level: Not on file  ?Occupational History  ? Not on file  ?Tobacco Use  ? Smoking status: Former  ?  Packs/day: 2.00  ?  Types: Cigarettes  ?  Quit date: 08/17/1994  ?  Years since quitting: 27.3  ? Smokeless tobacco: Never  ?Vaping Use  ? Vaping Use: Never used  ?Substance and Sexual Activity  ? Alcohol use: No  ? Drug use: No  ? Sexual activity: Not on file  ?Other Topics Concern  ? Not on file  ?Social History Narrative  ? Lives at home with husband, Shon Hale  ? Former smoker Smoked 2 ppd x 25 yrs; Quit in 1996  ? 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 Now on fixed income from her SSI and pension  ? ?Social Determinants of Health  ? ?Financial Resource Strain: Low Risk   ? Difficulty of Paying Living Expenses: Not hard at all  ?Food  Insecurity: No Food Insecurity  ? Worried About Programme researcher, broadcasting/film/video in the Last Year: Never true  ? Ran Out of Food in the Last Year: Never true  ?Transportation Needs: No Transportation Needs  ? Lack of Transportation (Medical): No  ? Lack of Transportation (Non-Medical): No  ?Physical Activity: Unknown  ? Days of Exercise per Week: 1 day  ? Minutes of Exercise per Session: Not on file  ?Stress: No Stress Concern Present  ? Feeling of Stress : Only a little  ?Social Connections: Socially Integrated  ? Frequency of Communication with Friends and Family: More than three times a week  ? Frequency of Social Gatherings with Friends and Family: More than three times a week  ? Attends Religious Services: More than 4 times per year  ? Active Member of Clubs or Organizations: Yes  ? Attends Banker Meetings: More than 4 times per year  ? Marital Status: Married  ? ? ?Family History  ?Problem Relation Age of Onset  ?  Cancer Mother   ? Diabetes Father   ? Heart Problems Father   ? COPD Father   ? Emphysema Father   ? Heart block Father   ? Congestive Heart Failure Father   ? ? ?Outpatient Encounter Medications as of 12/16/2021  ?Medication Sig  ? glucose blood (CONTOUR TEST) test strip Use as instructed  ? Multiple Vitamin (MULTIVITAMIN ADULT PO) Take 1 tablet by mouth daily.  ? apixaban (ELIQUIS) 5 MG TABS tablet Take 1 tablet (5 mg total) by mouth 2 (two) times daily.  ? citalopram (CELEXA) 40 MG tablet Take 40 mg by mouth every evening.  ? estradiol (ESTRACE) 2 MG tablet Take 2 mg by mouth every evening.  ? EUTHYROX 25 MCG tablet Take 25 mcg by mouth daily before breakfast.  ? fenofibrate (TRICOR) 145 MG tablet Take 145 mg by mouth every evening.  ? fluorometholone (FML) 0.1 % ophthalmic suspension Place 1 drop into both eyes 3 (three) times daily as needed (allergy eyes.).  ? gabapentin (NEURONTIN) 100 MG capsule Take 1 capsule (100 mg total) by mouth 3 (three) times daily.  ? glipiZIDE (GLUCOTROL) 2.5 mg TABS  tablet Take 5 mg by mouth daily with breakfast.  ? isosorbide mononitrate (IMDUR) 60 MG 24 hr tablet Take 1 tablet (60 mg total) by mouth daily.  ? levocetirizine (XYZAL) 5 MG tablet Take 5 mg by mouth every evening.  ? metFORMIN (GLUCOPHAGE) 500 MG tablet Take 2 tablets (1,000 mg total) by mouth 2 (two) times daily with a meal.  ? montelukast (SINGULAIR) 10 MG tablet Take 10 mg by mouth at bedtime.  ? Omega-3 Fatty Acids (FISH OIL) 1000 MG CAPS Take 1,000 mg by mouth every evening.  ? omeprazole (PRILOSEC) 40 MG capsule Take 40 mg by mouth every evening.  ? rosuvastatin (CRESTOR) 10 MG tablet Take 10 mg by mouth every evening.  ? ?No facility-administered encounter medications on file as of 12/16/2021.  ? ? ?ALLERGIES: ?Allergies  ?Allergen Reactions  ? Azithromycin Hives  ? Codeine Nausea Only  ? Atomoxetine Nausea And Vomiting  ? Atorvastatin Other (See Comments)  ?  Restless legs  ? Lisinopril Nausea And Vomiting  ? ? ?VACCINATION STATUS: ?Immunization History  ?Administered Date(s) Administered  ? Fluad Quad(high Dose 65+) 05/14/2021  ? Moderna SARS-COV2 Booster Vaccination 06/13/2020  ? Moderna Sars-Covid-2 Vaccination 09/06/2019, 10/04/2019  ? Pneumococcal Polysaccharide-23 04/22/2017  ? Pneumococcal-Unspecified 05/31/2020  ? Tdap 04/22/2017  ? Zoster Recombinat (Shingrix) 11/30/2017, 02/20/2018  ? ? ?Diabetes ?She presents for her initial diabetic visit. She has type 2 diabetes mellitus. Onset time: Patient was diagnosed at approximate age of 49 years. There are no hypoglycemic associated symptoms. Pertinent negatives for hypoglycemia include no confusion, headaches, pallor or seizures. Associated symptoms include blurred vision, fatigue, polydipsia and polyuria. Pertinent negatives for diabetes include no chest pain and no polyphagia. There are no hypoglycemic complications. Symptoms are worsening. Diabetic complications include heart disease and peripheral neuropathy. Risk factors for coronary artery disease  include dyslipidemia, diabetes mellitus, family history, obesity, post-menopausal, sedentary lifestyle, tobacco exposure and hypertension. Current diabetic treatments: She is currently on metformin 1000 mg p.o. twice daily, glipizide 7.5 mg p.o. daily. Her weight is increasing steadily. She is following a generally unhealthy diet. When asked about meal planning, she reported none. She never participates in exercise. Her home blood glucose trend is increasing steadily. Her overall blood glucose range is >200 mg/dl. (She presents with a meter showing average blood glucose of 341 for the last 14  days of 27 readings.  Her recent A1c was 10.3%.) An ACE inhibitor/angiotensin II receptor blocker is not being taken. Eye exam is current.  ?Hyperlipidemia ?This is a chronic problem. The current episode started more than 1 year ago. The problem is uncontrolled. Exacerbating diseases include diabetes, hypothyroidism and obesity. Pertinent negatives include no chest pain, myalgias or shortness of breath. Current antihyperlipidemic treatment includes statins, fibric acid derivatives and bile acid sequestrants. Risk factors for coronary artery disease include family history, dyslipidemia, obesity, a sedentary lifestyle, post-menopausal, hypertension and diabetes mellitus.  ?Hypertension ?This is a chronic problem. The current episode started more than 1 year ago. Associated symptoms include blurred vision. Pertinent negatives include no chest pain, headaches, palpitations or shortness of breath. Risk factors for coronary artery disease include dyslipidemia, diabetes mellitus, family history, obesity, post-menopausal state, smoking/tobacco exposure and sedentary lifestyle. Past treatments include direct vasodilators. Hypertensive end-organ damage includes CAD/MI.  ? ? ?Review of Systems  ?Constitutional:  Positive for fatigue. Negative for chills, fever and unexpected weight change.  ?HENT:  Negative for trouble swallowing and  voice change.   ?Eyes:  Positive for blurred vision. Negative for visual disturbance.  ?Respiratory:  Negative for cough, shortness of breath and wheezing.   ?Cardiovascular:  Negative for chest pain, palp

## 2021-12-17 NOTE — Telephone Encounter (Signed)
Patient made aware.

## 2021-12-18 ENCOUNTER — Ambulatory Visit (INDEPENDENT_AMBULATORY_CARE_PROVIDER_SITE_OTHER): Payer: Medicare HMO | Admitting: Pulmonary Disease

## 2021-12-18 ENCOUNTER — Encounter: Payer: Self-pay | Admitting: Pulmonary Disease

## 2021-12-18 ENCOUNTER — Telehealth: Payer: Self-pay | Admitting: Pharmacy Technician

## 2021-12-18 DIAGNOSIS — G2581 Restless legs syndrome: Secondary | ICD-10-CM

## 2021-12-18 DIAGNOSIS — Z9989 Dependence on other enabling machines and devices: Secondary | ICD-10-CM

## 2021-12-18 DIAGNOSIS — E039 Hypothyroidism, unspecified: Secondary | ICD-10-CM | POA: Diagnosis not present

## 2021-12-18 DIAGNOSIS — Z0001 Encounter for general adult medical examination with abnormal findings: Secondary | ICD-10-CM | POA: Diagnosis not present

## 2021-12-18 DIAGNOSIS — Z1331 Encounter for screening for depression: Secondary | ICD-10-CM | POA: Diagnosis not present

## 2021-12-18 DIAGNOSIS — G4733 Obstructive sleep apnea (adult) (pediatric): Secondary | ICD-10-CM

## 2021-12-18 DIAGNOSIS — E785 Hyperlipidemia, unspecified: Secondary | ICD-10-CM | POA: Diagnosis not present

## 2021-12-18 DIAGNOSIS — Z1389 Encounter for screening for other disorder: Secondary | ICD-10-CM | POA: Diagnosis not present

## 2021-12-18 DIAGNOSIS — E1165 Type 2 diabetes mellitus with hyperglycemia: Secondary | ICD-10-CM | POA: Diagnosis not present

## 2021-12-18 DIAGNOSIS — G4710 Hypersomnia, unspecified: Secondary | ICD-10-CM

## 2021-12-18 MED ORDER — MODAFINIL 100 MG PO TABS: 100.0000 mg | ORAL_TABLET | Freq: Every day | ORAL | 0 refills | Status: DC

## 2021-12-18 NOTE — Assessment & Plan Note (Addendum)
She has persistent somnolence in spite of CPAP compliance.  We will try Provigil 100 mg, discussed side effects including cardiac.  She has used Adderall in the past ?Reviewed MSLT which does not show any evidence of narcolepsy ?

## 2021-12-18 NOTE — Progress Notes (Signed)
? ?  Subjective:  ? ? Patient ID: Rachel Odonnell, female    DOB: 04/13/1956, 66 y.o.   MRN: [id number] ? ?HPI ? ?66 yo ex-smoker with mild emphysema ,OSA and persistent hypersomnolence ?Initial consultation 04/2021 .  She was having shortness of breath on exertion and chest pain radiating to her jaws.  Normal dobutamine stress echo ?She reports a diagnosis of narcolepsy many years ago when she was living in [place].  ?-RLS on requip ?  ?  ?PMH -ADD - was on Adderall. ?-Atrial fibrillation ?  ?She lives with her husband and is a retired Magazine features editor, used to work Education officer, community ? ?On her last visit, we changed her to auto CPAP 5 to 14 cm. ?She also decreased Requip. ?She saw neurology for persistent tremors, imaging was negative .Requip was changed to gabapentin I reviewed consultation ?She reports persistent somnolence, she reports good compliance with her CPAP very seldom she will fall asleep in a recliner and forget to use it. ?CPAP download was reviewed ? ?We also reviewed titration sleep study and MSLT ? ?Significant tests/ events reviewed ? ?CPAP titration 10/2021 13 cm ?MSLT  5/5 naps,MSL 2:50 s, no SoREMs ?2022 split >>AHI 43/hour >> nasal CPAP of 10 cm ? ?CTA chest 02/2021 mild coronary artery calcification, mild paraseptal emphysema ? - non obs CAD ?05/2021 LHC/RHC nml cors, PA pressures ?  ?PFTs 07/2021 mild restriction, ratio 82, FEV1 84%, FVC 78%, DLCO 75% ? ?Review of Systems ?neg for any significant sore throat, dysphagia, itching, sneezing, nasal congestion or excess/ purulent secretions, fever, chills, sweats, unintended wt loss, pleuritic or exertional cp, hempoptysis, orthopnea pnd or change in chronic leg swelling. Also denies presyncope, palpitations, heartburn, abdominal pain, nausea, vomiting, diarrhea or change in bowel or urinary habits, dysuria,hematuria, rash, arthralgias, visual complaints, headache, numbness weakness or ataxia. ? ?   ?Objective:  ? Physical Exam ? ?Gen. Pleasant, obese, in no  distress ?ENT - no lesions, no post nasal drip ?Neck: No JVD, no thyromegaly, no carotid bruits ?Lungs: no use of accessory muscles, no dullness to percussion, decreased without rales or rhonchi  ?Cardiovascular: Rhythm regular, heart sounds  normal, no murmurs or gallops, no peripheral edema ?Musculoskeletal: No deformities, no cyanosis or clubbing , no tremors ? ? ? ?   ?Assessment & Plan:  ? ? ?

## 2021-12-18 NOTE — Telephone Encounter (Signed)
Received notification from [facility] regarding a prior authorization for  Modinafil 100mg  . Authorization has been APPROVED from 08/17/21 to 08/16/22.  ? ?Authorization # Key: 08/18/22 - PA Case ID: [id number] ? ?Phone # [phone]  ?

## 2021-12-18 NOTE — Assessment & Plan Note (Signed)
CPAP download was reviewed which shows few residual events with AHI 11/hour on auto settings 5 to 14 cm with average pressure of 12 and maximum pressure of 13 cm. ?Compliance is excellent with almost 8 to 9 hours per night ?We will change to auto settings 8 to 14 cm ? ?Weight loss encouraged, compliance with goal of at least 4-6 hrs every night is the expectation. ?Advised against medications with sedative side effects ?Cautioned against driving when sleepy - understanding that sleepiness will vary on a day to day basis ? ? ?

## 2021-12-18 NOTE — Patient Instructions (Signed)
?  X Rx for provigil 100 mg - take as needed  ? ?Take gabapentin 4pm & at bedtime ?

## 2021-12-18 NOTE — Assessment & Plan Note (Signed)
I have asked her to skip morning dose of gabapentin to decrease somnolence and take it starting at 4 PM and then another dose at bedtime for restless legs ?

## 2021-12-22 DIAGNOSIS — S338XXA Sprain of other parts of lumbar spine and pelvis, initial encounter: Secondary | ICD-10-CM | POA: Diagnosis not present

## 2021-12-22 DIAGNOSIS — M9901 Segmental and somatic dysfunction of cervical region: Secondary | ICD-10-CM | POA: Diagnosis not present

## 2021-12-22 DIAGNOSIS — M9902 Segmental and somatic dysfunction of thoracic region: Secondary | ICD-10-CM | POA: Diagnosis not present

## 2021-12-22 DIAGNOSIS — M9903 Segmental and somatic dysfunction of lumbar region: Secondary | ICD-10-CM | POA: Diagnosis not present

## 2021-12-22 DIAGNOSIS — S233XXA Sprain of ligaments of thoracic spine, initial encounter: Secondary | ICD-10-CM | POA: Diagnosis not present

## 2021-12-22 DIAGNOSIS — S134XXA Sprain of ligaments of cervical spine, initial encounter: Secondary | ICD-10-CM | POA: Diagnosis not present

## 2021-12-23 ENCOUNTER — Telehealth: Payer: Self-pay | Admitting: Cardiology

## 2021-12-23 ENCOUNTER — Encounter: Payer: Self-pay | Admitting: "Endocrinology

## 2021-12-23 ENCOUNTER — Ambulatory Visit (INDEPENDENT_AMBULATORY_CARE_PROVIDER_SITE_OTHER): Payer: Medicare HMO | Admitting: "Endocrinology

## 2021-12-23 VITALS — BP 128/52 | HR 64 | Ht 64.0 in | Wt 214.2 lb

## 2021-12-23 DIAGNOSIS — E559 Vitamin D deficiency, unspecified: Secondary | ICD-10-CM

## 2021-12-23 DIAGNOSIS — E039 Hypothyroidism, unspecified: Secondary | ICD-10-CM

## 2021-12-23 DIAGNOSIS — E782 Mixed hyperlipidemia: Secondary | ICD-10-CM | POA: Diagnosis not present

## 2021-12-23 DIAGNOSIS — E1159 Type 2 diabetes mellitus with other circulatory complications: Secondary | ICD-10-CM | POA: Diagnosis not present

## 2021-12-23 DIAGNOSIS — I4891 Unspecified atrial fibrillation: Secondary | ICD-10-CM

## 2021-12-23 DIAGNOSIS — K760 Fatty (change of) liver, not elsewhere classified: Secondary | ICD-10-CM

## 2021-12-23 DIAGNOSIS — I10 Essential (primary) hypertension: Secondary | ICD-10-CM

## 2021-12-23 LAB — POCT GLYCOSYLATED HEMOGLOBIN (HGB A1C): HbA1c, POC (controlled diabetic range): 11.5 % — AB (ref 0.0–7.0)

## 2021-12-23 MED ORDER — EMPAGLIFLOZIN 10 MG PO TABS: 10.0000 mg | ORAL_TABLET | Freq: Every day | ORAL | 2 refills | Status: DC

## 2021-12-23 NOTE — Patient Instructions (Signed)

## 2021-12-23 NOTE — Telephone Encounter (Signed)
Unsure if she's in donut hole. Last 90 day supply was $45 today $160 for a 30 day supply. Says she can not afford this. Does not qualify for patient assistance due to income of over $70,000 per year. Advised that samples are not available enough to supply for the rest of the year. Advised only other option would be to switch to coumadin and message would be sent to provider.  ?

## 2021-12-23 NOTE — Progress Notes (Signed)
? ?                                                             Endocrinology Consult Note  ?     12/23/2021, 7:39 PM ? ? ?Subjective:  ? ? Patient ID: Rachel Odonnell, female    DOB: 1956-02-12.  ?Rachel Odonnell is being seen in consultation for management of currently uncontrolled symptomatic diabetes requested by  Rachel Shivers, PA-C. ? ? ?Past Medical History:  ?Diagnosis Date  ? ADD (attention deficit disorder)   ? Diabetes mellitus without complication (HCC)   ? Hypertension   ? ? ?Past Surgical History:  ?Procedure Laterality Date  ? ABDOMINAL HYSTERECTOMY    ? EXCISIONAL HEMORRHOIDECTOMY    ? INCONTINENCE SURGERY    ? RIGHT/LEFT HEART CATH AND CORONARY ANGIOGRAPHY N/A 06/13/2021  ? Procedure: RIGHT/LEFT HEART CATH AND CORONARY ANGIOGRAPHY;  Surgeon: Swaziland, Peter M, MD;  Location: [facility] INVASIVE CV LAB;  Service: Cardiovascular;  Laterality: N/A;  ? ? ?Social History  ? ?Socioeconomic History  ? Marital status: Married  ?  Spouse name: Shon Hale  ? Number of children: Not on file  ? Years of education: Not on file  ? Highest education level: Not on file  ?Occupational History  ? Not on file  ?Tobacco Use  ? Smoking status: Former  ?  Packs/day: 2.00  ?  Types: Cigarettes  ?  Quit date: 08/17/1994  ?  Years since quitting: 27.3  ? Smokeless tobacco: Never  ?Vaping Use  ? Vaping Use: Never used  ?Substance and Sexual Activity  ? Alcohol use: No  ? Drug use: No  ? Sexual activity: Not on file  ?Other Topics Concern  ? Not on file  ?Social History Narrative  ? Lives at home with husband, Shon Hale  ? Former smoker Smoked 2 ppd x 25 yrs; Quit in 1996  ? 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 Now on fixed income from her SSI and pension  ? ?Social Determinants of Health  ? ?Financial Resource Strain: Low Risk   ? Difficulty of Paying Living Expenses: Not hard at all  ?Food  Insecurity: No Food Insecurity  ? Worried About Programme researcher, broadcasting/film/video in the Last Year: Never true  ? Ran Out of Food in the Last Year: Never true  ?Transportation Needs: No Transportation Needs  ? Lack of Transportation (Medical): No  ? Lack of Transportation (Non-Medical): No  ?Physical Activity: Unknown  ? Days of Exercise per Week: 1 day  ? Minutes of Exercise per Session: Not on file  ?Stress: No Stress Concern Present  ? Feeling of Stress : Only a little  ?Social Connections: Socially Integrated  ? Frequency of Communication with Friends and Family: More than three times a week  ? Frequency of Social Gatherings with Friends and Family: More than three times a week  ? Attends Religious Services: More than 4 times per year  ? Active Member of Clubs or Organizations: Yes  ? Attends Banker Meetings: More than 4 times per year  ? Marital Status: Married  ? ? ?Family History  ?Problem Relation Age of Onset  ?  Cancer Mother   ? Diabetes Father   ? Heart Problems Father   ? COPD Father   ? Emphysema Father   ? Heart block Father   ? Congestive Heart Failure Father   ? ? ?Outpatient Encounter Medications as of 12/23/2021  ?Medication Sig  ? empagliflozin (JARDIANCE) 10 MG TABS tablet Take 1 tablet (10 mg total) by mouth daily.  ? apixaban (ELIQUIS) 5 MG TABS tablet Take 1 tablet (5 mg total) by mouth 2 (two) times daily.  ? Cholecalciferol (VITAMIN D3) 125 MCG (5000 UT) CAPS Take 1 capsule (5,000 Units total) by mouth daily.  ? citalopram (CELEXA) 40 MG tablet Take 40 mg by mouth every evening.  ? estradiol (ESTRACE) 2 MG tablet Take 2 mg by mouth every evening.  ? EUTHYROX 25 MCG tablet Take 25 mcg by mouth daily before breakfast.  ? fenofibrate (TRICOR) 145 MG tablet Take 145 mg by mouth every evening.  ? fluorometholone (FML) 0.1 % ophthalmic suspension Place 1 drop into both eyes 3 (three) times daily as needed (allergy eyes.).  ? gabapentin (NEURONTIN) 100 MG capsule Take 1 capsule (100 mg total) by  mouth 3 (three) times daily.  ? glipiZIDE (GLUCOTROL) 2.5 mg TABS tablet Take 5 mg by mouth daily with breakfast.  ? glucose blood (CONTOUR TEST) test strip Use as instructed  ? isosorbide mononitrate (IMDUR) 60 MG 24 hr tablet Take 1 tablet (60 mg total) by mouth daily.  ? levocetirizine (XYZAL) 5 MG tablet Take 5 mg by mouth every evening.  ? metFORMIN (GLUCOPHAGE) 500 MG tablet Take 2 tablets (1,000 mg total) by mouth 2 (two) times daily with a meal.  ? modafinil (PROVIGIL) 100 MG tablet Take 1 tablet (100 mg total) by mouth daily.  ? montelukast (SINGULAIR) 10 MG tablet Take 10 mg by mouth at bedtime.  ? Multiple Vitamin (MULTIVITAMIN ADULT PO) Take 1 tablet by mouth daily.  ? Omega-3 Fatty Acids (FISH OIL) 1000 MG CAPS Take 1,000 mg by mouth every evening.  ? omeprazole (PRILOSEC) 40 MG capsule Take 40 mg by mouth every evening.  ? rosuvastatin (CRESTOR) 10 MG tablet Take 10 mg by mouth every evening.  ? ?No facility-administered encounter medications on file as of 12/23/2021.  ? ? ?ALLERGIES: ?Allergies  ?Allergen Reactions  ? Azithromycin Hives  ? Codeine Nausea Only  ? Atomoxetine Nausea And Vomiting  ? Atorvastatin Other (See Comments)  ?  Restless legs  ? Lisinopril Nausea And Vomiting  ? ? ?VACCINATION STATUS: ?Immunization History  ?Administered Date(s) Administered  ? Fluad Quad(high Dose 65+) 05/14/2021  ? Moderna SARS-COV2 Booster Vaccination 06/13/2020  ? Moderna Sars-Covid-2 Vaccination 09/06/2019, 10/04/2019  ? Pneumococcal Polysaccharide-23 04/22/2017  ? Pneumococcal-Unspecified 05/31/2020  ? Tdap 04/22/2017  ? Zoster Recombinat (Shingrix) 11/30/2017, 02/20/2018  ? ? ?Diabetes ?She presents for her follow-up diabetic visit. She has type 2 diabetes mellitus. Onset time: Patient was diagnosed at approximate age of 49 years. Her disease course has been worsening. There are no hypoglycemic associated symptoms. Pertinent negatives for hypoglycemia include no confusion, headaches, pallor or seizures.  Associated symptoms include blurred vision, fatigue, polydipsia and polyuria. Pertinent negatives for diabetes include no chest pain and no polyphagia. There are no hypoglycemic complications. Symptoms are worsening. Diabetic complications include heart disease and peripheral neuropathy. Risk factors for coronary artery disease include dyslipidemia, diabetes mellitus, family history, obesity, post-menopausal, sedentary lifestyle, tobacco exposure and hypertension. Current diabetic treatments: She is currently on metformin 1000 mg p.o. twice daily, glipizide 7.5 mg  p.o. daily. Her weight is increasing steadily. She is following a generally unhealthy diet. When asked about meal planning, she reported none. She never participates in exercise. Her home blood glucose trend is increasing steadily. Her breakfast blood glucose range is generally >200 mg/dl. Her lunch blood glucose range is generally >200 mg/dl. Her dinner blood glucose range is generally >200 mg/dl. Her bedtime blood glucose range is generally >200 mg/dl. Her overall blood glucose range is >200 mg/dl. (She presents with persistent, severe hyperglycemia averaging 300-350 mg per DL.  Her point-of-care A1c is 11.5%.   ?However, she presents with 6 pound weight loss, feeling better.) An ACE inhibitor/angiotensin II receptor blocker is not being taken. Eye exam is current.  ?Hyperlipidemia ?This is a chronic problem. The current episode started more than 1 year ago. The problem is uncontrolled. Exacerbating diseases include diabetes, hypothyroidism and obesity. Pertinent negatives include no chest pain, myalgias or shortness of breath. Current antihyperlipidemic treatment includes statins, fibric acid derivatives and bile acid sequestrants. Risk factors for coronary artery disease include family history, dyslipidemia, obesity, a sedentary lifestyle, post-menopausal, hypertension and diabetes mellitus.  ?Hypertension ?This is a chronic problem. The current  episode started more than 1 year ago. Associated symptoms include blurred vision. Pertinent negatives include no chest pain, headaches, palpitations or shortness of breath. Risk factors for coronary artery disease inc

## 2021-12-23 NOTE — Telephone Encounter (Signed)
Patient states she went to get her refills of Eliquis and it was going to cost her $160 for 30 days.  She can't afford that.  She would like for something else to be prescribed to her.  ?

## 2021-12-24 ENCOUNTER — Telehealth: Payer: Self-pay | Admitting: "Endocrinology

## 2021-12-24 MED ORDER — RIVAROXABAN 20 MG PO TABS: 20.0000 mg | ORAL_TABLET | Freq: Every day | ORAL | 0 refills | Status: DC

## 2021-12-24 NOTE — Telephone Encounter (Signed)
Can set her up in coumadin clinic ? ?Josue Hector MD ?

## 2021-12-24 NOTE — Telephone Encounter (Signed)
Rachel Odonnell ?OK to schedule as a new coumadin patient one day next week.  Will need to see her 3 days before she runs out of Eliquis. ?[name] ?

## 2021-12-24 NOTE — Telephone Encounter (Signed)
Pt made aware

## 2021-12-24 NOTE — Telephone Encounter (Signed)
Pt left voicemail that Rachel Odonnell is $158, she can not afford that and is requesting something cheaper to be sent in.  ?

## 2021-12-24 NOTE — Telephone Encounter (Signed)
Called and spoke with patient.  She talked with  insurance company and she is in the donut hole.  She is not excited about changing to warfarin.  Will send in Rx for Xarelto to [facility] to check the cost of it before changing to warfarin.  She will check on proce and let me know which way she decides to go. ?

## 2021-12-24 NOTE — Addendum Note (Signed)
Addended by: Malen Gauze on: 12/24/2021 03:18 PM ? ? Modules accepted: Orders ? ?

## 2021-12-25 ENCOUNTER — Telehealth: Payer: Self-pay | Admitting: Pulmonary Disease

## 2021-12-25 DIAGNOSIS — G4733 Obstructive sleep apnea (adult) (pediatric): Secondary | ICD-10-CM

## 2021-12-25 NOTE — Telephone Encounter (Signed)
Patient feels that her pressures for CPAP need to be adjusted.  She states this was discussed at [facility] and an order was placed.  ?States the wrong DME called her to change.   ? ?Will place a new order for Adjustment with DME [facility].  ?

## 2021-12-29 NOTE — Telephone Encounter (Signed)
Pt states ever since her office visit her readings have been over 200. She said this morning it was 217 and that was the lowest its been. Please advise ?

## 2021-12-29 NOTE — Telephone Encounter (Signed)
See note below from last week. She could not afford the jardiance so you increased her glipizide to 10 mg already please advise ?

## 2021-12-30 ENCOUNTER — Telehealth: Payer: Self-pay | Admitting: Cardiology

## 2021-12-30 NOTE — Telephone Encounter (Signed)
Called pt and left VM to call back to discuss ?

## 2021-12-30 NOTE — Telephone Encounter (Signed)
Pt checked on cost of Xarelto and it cost about the same as Eliquis.  She can not afford either.  She is willing to change to warfarin.  Appt made for pt on Thursdays 5/18 at 10:00am.  She is in agreement. ?

## 2021-12-30 NOTE — Telephone Encounter (Signed)
Pt called back and said that she did have fried fish over the weekend as well as Femia rice and not brown rice. Pt wanted you to be aware.  ?

## 2021-12-30 NOTE — Telephone Encounter (Signed)
Pt returning nurses call regarding medication Eliquis. Please advise ?

## 2021-12-31 DIAGNOSIS — Z86010 Personal history of colonic polyps: Secondary | ICD-10-CM | POA: Diagnosis not present

## 2022-01-01 ENCOUNTER — Ambulatory Visit: Payer: Medicare HMO

## 2022-01-01 DIAGNOSIS — Z5181 Encounter for therapeutic drug level monitoring: Secondary | ICD-10-CM | POA: Insufficient documentation

## 2022-01-01 DIAGNOSIS — I4891 Unspecified atrial fibrillation: Secondary | ICD-10-CM | POA: Insufficient documentation

## 2022-01-05 DIAGNOSIS — M9903 Segmental and somatic dysfunction of lumbar region: Secondary | ICD-10-CM | POA: Diagnosis not present

## 2022-01-05 DIAGNOSIS — S338XXA Sprain of other parts of lumbar spine and pelvis, initial encounter: Secondary | ICD-10-CM | POA: Diagnosis not present

## 2022-01-05 DIAGNOSIS — M9902 Segmental and somatic dysfunction of thoracic region: Secondary | ICD-10-CM | POA: Diagnosis not present

## 2022-01-05 DIAGNOSIS — S233XXA Sprain of ligaments of thoracic spine, initial encounter: Secondary | ICD-10-CM | POA: Diagnosis not present

## 2022-01-05 DIAGNOSIS — M9901 Segmental and somatic dysfunction of cervical region: Secondary | ICD-10-CM | POA: Diagnosis not present

## 2022-01-05 DIAGNOSIS — S134XXA Sprain of ligaments of cervical spine, initial encounter: Secondary | ICD-10-CM | POA: Diagnosis not present

## 2022-01-06 ENCOUNTER — Ambulatory Visit (INDEPENDENT_AMBULATORY_CARE_PROVIDER_SITE_OTHER): Payer: Medicare HMO | Admitting: "Endocrinology

## 2022-01-06 ENCOUNTER — Encounter: Payer: Self-pay | Admitting: "Endocrinology

## 2022-01-06 VITALS — BP 110/50 | HR 64 | Ht 64.0 in | Wt 214.8 lb

## 2022-01-06 DIAGNOSIS — E039 Hypothyroidism, unspecified: Secondary | ICD-10-CM

## 2022-01-06 DIAGNOSIS — E782 Mixed hyperlipidemia: Secondary | ICD-10-CM | POA: Diagnosis not present

## 2022-01-06 DIAGNOSIS — E1159 Type 2 diabetes mellitus with other circulatory complications: Secondary | ICD-10-CM | POA: Diagnosis not present

## 2022-01-06 DIAGNOSIS — I10 Essential (primary) hypertension: Secondary | ICD-10-CM | POA: Diagnosis not present

## 2022-01-06 DIAGNOSIS — E559 Vitamin D deficiency, unspecified: Secondary | ICD-10-CM

## 2022-01-06 MED ORDER — BD PEN NEEDLE SHORT U/F 31G X 8 MM MISC: 1.0000 | 2 refills | Status: AC

## 2022-01-06 MED ORDER — GLIPIZIDE 5 MG PO TABS: 5.0000 mg | ORAL_TABLET | Freq: Every day | ORAL | 1 refills | Status: DC

## 2022-01-06 MED ORDER — TRESIBA FLEXTOUCH 200 UNIT/ML ~~LOC~~ SOPN: 20.0000 [IU] | PEN_INJECTOR | Freq: Every day | SUBCUTANEOUS | 2 refills | Status: DC

## 2022-01-06 NOTE — Progress Notes (Signed)
01/06/2022, 2:04 PM   Endocrinology follow-up note  Subjective:    Patient ID: Rachel KrebsKathy M Waters, female    DOB: 03-Sep-1955.  Rachel Odonnell is being seen in follow-up after she was seen in consultation for  management of currently uncontrolled symptomatic diabetes requested by  Royann ShiversSkillman, Katherine E, PA-C.   Past Medical History:  Diagnosis Date   ADD (attention deficit disorder)    Diabetes mellitus without complication (HCC)    Hypertension     Past Surgical History:  Procedure Laterality Date   ABDOMINAL HYSTERECTOMY     EXCISIONAL HEMORRHOIDECTOMY     INCONTINENCE SURGERY     RIGHT/LEFT HEART CATH AND CORONARY ANGIOGRAPHY N/A 06/13/2021   Procedure: RIGHT/LEFT HEART CATH AND CORONARY ANGIOGRAPHY;  Surgeon: SwazilandJordan, Peter M, MD;  [facility];  Service: Cardiovascular;  Laterality: N/A;    Social History   Socioeconomic History   Marital status: Married    Spouse name: Shon HaleLeon   Number of children: Not on file   Years of education: Not on file   Highest education level: Not on file  Occupational History   Not on file  Tobacco Use   Smoking status: Former    Packs/day: 2.00    Types: Cigarettes    Quit date: 08/17/1994    Years since quitting: 27.4   Smokeless tobacco: Never  Vaping Use   Vaping Use: Never used  Substance and Sexual Activity   Alcohol use: No   Drug use: No   Sexual activity: Not on file  Other Topics Concern   Not on file  Social History Narrative   Lives at home with husband, Shon HaleLeon   Former smoker Smoked 2 ppd x 25 yrs; Quit in 1996   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, retailsupervisor  retired Secondary school teacherstaff sergeant, 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, used to work Editor, commissioningcorrections   Now on fixed income from her SSI and pension   Social Determinants of Corporate investment bankerHealth   Financial Resource Strain: Low Risk    Difficulty of Paying Living  Expenses: Not hard at [employer]  Food Insecurity: No Food Insecurity   Worried About Programme researcher, broadcasting/film/videounning Out of Food in the Last Year: Never true   Baristaan Out of Food in the Last Year: Never true  Transportation Needs: No Transportation Needs   Lack of Transportation (Medical): No   Lack of Transportation (Non-Medical): No  Physical Activity: Unknown   Days of Exercise per Week: 1 day   Minutes of Exercise per Session: Not on file  Stress: No Stress Concern Present   Feeling of Stress : Only a little  Social Connections: Press photographerocially Integrated   Frequency of Communication with Friends and Family: More than three times a week   Frequency of Social Gatherings with Friends and Family: More than three times a week   Attends Religious Services: More than 4 times per year   Active Member of [employer] Financial[employer]ubs or Organizations: Yes   Attends Engineer, structuralClub or Organization Meetings: More than 4 times per year   Marital Status: Married    Family History  Problem Relation Age of  Onset   Cancer Mother    Diabetes Father    Heart Problems Father    COPD Father    Emphysema Father    Heart block Father    Congestive Heart Failure Father     Outpatient Encounter Medications as of 01/06/2022  Medication Sig   insulin degludec (TRESIBA FLEXTOUCH) 200 UNIT/ML FlexTouch Pen Inject 20 Units into the skin at bedtime.   Insulin Pen Needle (B-D ULTRAFINE III SHORT PEN) 31G X 8 MM MISC 1 each by Does not apply route as directed.   [DISCONTINUED] glipiZIDE (GLUCOTROL) 10 MG tablet Take 10 mg by mouth daily before breakfast.   Cholecalciferol (VITAMIN D3) 125 MCG (5000 UT) CAPS Take 1 capsule (5,000 Units total) by mouth daily.   citalopram (CELEXA) 40 MG tablet Take 40 mg by mouth every evening.   estradiol (ESTRACE) 2 MG tablet Take 2 mg by mouth every evening.   EUTHYROX 25 MCG tablet Take 25 mcg by mouth daily before breakfast.   fenofibrate (TRICOR) 145 MG tablet Take 145 mg by mouth every evening.   fluorometholone (FML) 0.1 % ophthalmic  suspension Place 1 drop into both eyes 3 (three) times daily as needed (allergy eyes.).   gabapentin (NEURONTIN) 100 MG capsule Take 1 capsule (100 mg total) by mouth 3 (three) times daily.   glipiZIDE (GLUCOTROL) 5 MG tablet Take 1 tablet (5 mg total) by mouth daily before breakfast.   glucose blood (CONTOUR TEST) test strip Use as instructed   isosorbide mononitrate (IMDUR) 60 MG 24 hr tablet Take 1 tablet (60 mg total) by mouth daily.   levocetirizine (XYZAL) 5 MG tablet Take 5 mg by mouth every evening.   metFORMIN (GLUCOPHAGE) 500 MG tablet Take 2 tablets (1,000 mg total) by mouth 2 (two) times daily with a meal.   modafinil (PROVIGIL) 100 MG tablet Take 1 tablet (100 mg total) by mouth daily.   montelukast (SINGULAIR) 10 MG tablet Take 10 mg by mouth at bedtime.   Multiple Vitamin (MULTIVITAMIN ADULT PO) Take 1 tablet by mouth daily.   Omega-3 Fatty Acids (FISH OIL) 1000 MG CAPS Take 1,000 mg by mouth every evening.   omeprazole (PRILOSEC) 40 MG capsule Take 40 mg by mouth every evening.   rosuvastatin (CRESTOR) 10 MG tablet Take 10 mg by mouth every evening.   warfarin (COUMADIN) 5 MG tablet Take 5 mg by mouth daily. Or as directed by coumadin clinic   [DISCONTINUED] empagliflozin (JARDIANCE) 10 MG TABS tablet Take 1 tablet (10 mg total) by mouth daily.   [DISCONTINUED] glipiZIDE (GLUCOTROL) 2.5 mg TABS tablet Take 5 mg by mouth daily with breakfast.   No facility-administered encounter medications on file as of 01/06/2022.    ALLERGIES: Allergies  Allergen Reactions   Azithromycin Hives   Codeine Nausea Only   Atomoxetine Nausea And Vomiting   Atorvastatin Other (See Comments)    Restless legs   Lisinopril Nausea And Vomiting    VACCINATION STATUS: Immunization History  Administered Date(s) Administered   Fluad Quad(high Dose 65+) 05/14/2021   Moderna SARS-COV2 Booster Vaccination 06/13/2020   Moderna Sars-Covid-2 Vaccination 09/06/2019, 10/04/2019   Pneumococcal  Polysaccharide-23 04/22/2017   Pneumococcal-Unspecified 05/31/2020   Tdap 04/22/2017   Zoster Recombinat (Shingrix) 11/30/2017, 02/20/2018    Diabetes She presents for her follow-up diabetic visit. She has type 2 diabetes mellitus. Onset time: Patient was diagnosed at approximate age of 49 years. Her disease course has been worsening. There are no hypoglycemic associated symptoms. Pertinent negatives for  hypoglycemia include no confusion, headaches, pallor or seizures. Associated symptoms include polydipsia and polyuria. Pertinent negatives for diabetes include no blurred vision, no chest pain, no fatigue and no polyphagia. There are no hypoglycemic complications. Symptoms are improving. Diabetic complications include heart disease and peripheral neuropathy. Risk factors for coronary artery disease include dyslipidemia, diabetes mellitus, family history, obesity, post-menopausal, sedentary lifestyle, tobacco exposure and hypertension. Current diabetic treatments: She is currently on metformin 1000 mg p.o. twice daily, glipizide 7.5 mg p.o. daily. Her weight is fluctuating minimally. She is following a generally unhealthy diet. When asked about meal planning, she reported none. She never participates in exercise. Her home blood glucose trend is decreasing steadily. Her breakfast blood glucose range is generally >200 mg/dl. Her lunch blood glucose range is generally >200 mg/dl. Her dinner blood glucose range is generally >200 mg/dl. Her bedtime blood glucose range is generally >200 mg/dl. Her overall blood glucose range is >200 mg/dl. (She presents with a meter showing average blood glucose of 295-307 for the last 14 days improving from 300-350 mg/min during her last visit.  Her most recent A1c was 11.5%.  She could not afford Jardiance and hence she discontinued. ) An ACE inhibitor/angiotensin II receptor blocker is not being taken. Eye exam is current.  Hyperlipidemia This is a chronic problem. The current  episode started more than 1 year ago. The problem is uncontrolled. Exacerbating diseases include diabetes, hypothyroidism and obesity. Pertinent negatives include no chest pain, myalgias or shortness of breath. Current antihyperlipidemic treatment includes statins, fibric acid derivatives and bile acid sequestrants. Risk factors for coronary artery disease include family history, dyslipidemia, obesity, a sedentary lifestyle, post-menopausal, hypertension and diabetes mellitus.  Hypertension This is a chronic problem. The current episode started more than 1 year ago. Pertinent negatives include no blurred vision, chest pain, headaches, palpitations or shortness of breath. Risk factors for coronary artery disease include dyslipidemia, diabetes mellitus, family history, obesity, post-menopausal state, smoking/tobacco exposure and sedentary lifestyle. Past treatments include direct vasodilators. Hypertensive end-organ damage includes CAD/MI.    Review of Systems  Constitutional:  Negative for chills, fatigue, fever and unexpected weight change.  HENT:  Negative for trouble swallowing and voice change.   Eyes:  Negative for blurred vision and visual disturbance.  Respiratory:  Negative for cough, shortness of breath and wheezing.   Cardiovascular:  Negative for chest pain, palpitations and leg swelling.  Gastrointestinal:  Negative for diarrhea, nausea and vomiting.  Endocrine: Positive for polydipsia and polyuria. Negative for cold intolerance, heat intolerance and polyphagia.  Musculoskeletal:  Negative for arthralgias and myalgias.  Skin:  Negative for color change, pallor, rash and wound.  Neurological:  Negative for seizures and headaches.  Psychiatric/Behavioral:  Negative for confusion and suicidal ideas.    Objective:       01/06/2022   10:40 AM 12/23/2021    3:29 PM 12/18/2021   11:58 AM  Vitals with BMI  Height     Weight 214 lbs 13 oz 214 lbs 3 oz 218 lbs 10 oz  BMI 36.85  36.75 37.5  Systolic 110 128 161  Diastolic 50 52 58  Pulse 64 64 72    BP (!) 110/50   Pulse 64   Ht  (1.626 m)   Wt 214 lb 12.8 oz (97.4 kg)   BMI 36.87 kg/m   Wt Readings from Last 3 Encounters:  01/06/22 214 lb 12.8 oz (97.4 kg)  12/23/21 214 lb 3.2 oz (97.2 kg)  12/18/21 218  lb 9.6 oz (99.2 kg)         CMP ( most recent) CMP     Component Value Date/Time   NA 137 06/13/2021 1030   NA 135 06/13/2021 1030   K 4.3 06/13/2021 1030   K 4.3 06/13/2021 1030   CL 99 06/11/2021 1331   CO2 25 06/11/2021 1331   GLUCOSE 338 (H) 06/11/2021 1331   BUN 12 09/25/2021 0000   CREATININE 0.8 09/25/2021 0000   CREATININE 0.88 06/11/2021 1331   CALCIUM 9.8 09/25/2021 0000   PROT 7.0 07/09/2012 1736   ALBUMIN 3.6 07/09/2012 1736   AST 42 (A) 09/25/2021 0000   ALT 29 09/25/2021 0000   ALKPHOS 72 07/09/2012 1736   BILITOT 0.2 (L) 07/09/2012 1736   GFRNONAA >60 06/11/2021 1331   GFRAA >90 07/09/2012 1736    Recent Results (from the past 2160 hour(s))  HgB A1c     Status: Abnormal   Collection Time: 12/23/21  4:10 PM  Result Value Ref Range   Hemoglobin A1C     HbA1c POC (<> result, manual entry)     HbA1c, POC (prediabetic range)     HbA1c, POC (controlled diabetic range) 11.5 (A) 0.0 - 7.0 %     Assessment & Plan:   1. DM type 2 causing vascular disease (HCC) 2. Mixed hyperlipidemia 3. Essential hypertension, benign 4. Hypothyroidism, unspecified type 5. Vitamin D deficiency 6. Class 2 severe obesity due to excess calories with serious comorbidity and body mass index  of 37.0 to 37.9 in adult (HCC) 7. Non-alcoholic fatty liver disease 8. Sleep apnea, unspecified type  - Rachel Odonnell has currently uncontrolled symptomatic type 2 DM since  66 years of age.   She presents with a meter showing average blood glucose of 295-307 for the last 14 days improving from 300-350 mg/min during her last visit.  Her most recent A1c was 11.5%.  She could not afford Jardiance  and hence she discontinued.    Recent labs reviewed. - I had a long discussion with her about the progressive nature of diabetes and the pathology behind its complications. -her diabetes is complicated by coronary artery disease, peripheral neuropathy, obesity, sedentary life, nonalcoholic fatty liver disease and she remains at extremely  high risk for more acute and chronic complications which include CAD, CVA, CKD, retinopathy, and neuropathy. These are all discussed in detail with her.  - I discussed all available options of managing her diabetes including de-escalation of medications.  Considering her associated comorbidities including sleep apnea requiring CPAP, nonalcoholic fatty liver disease, hypertension, hyperlipidemia, she is a perfect candidate for lifestyle medicine.    I have counseled her on diet  and weight management  by adopting a Whole Food , Plant Predominant  ( WFPP) nutrition as recommended by [employer] of Lifestyle Medicine. Patient is encouraged to switch to  unprocessed or minimally processed  complex starch, adequate protein intake (mainly plant source), minimal liquid fat ( mainly vegetable oils), plenty of fruits, and vegetables. -  she is advised to stick to a routine mealtimes to eat 3 complete meals a day and snack only when necessary ( to snack only to correct hypoglycemia BG <70 day time or <100 at night).   - she acknowledges that there is a room for improvement in her food and drink choices. - Suggestion is made for her to avoid simple carbohydrates  from her diet including Cakes, Sweet Desserts, Ice Cream, Soda (diet and regular), Sweet Tea, Candies,  Chips, Cookies, Store Bought Juices, Alcohol , Artificial Sweeteners,  Coffee Creamer, and "Sugar-free" Products, Lemonade. This will help patient to have more stable blood glucose profile and potentially avoid unintended weight gain.  The following Lifestyle Medicine recommendations according to American College  of Lifestyle Medicine  [facility]) were discussed and and offered to patient and she  agrees to start the journey:  A. Whole Foods, Plant-Based Nutrition comprising of fruits and vegetables, plant-based proteins, whole-grain carbohydrates was discussed in detail with the patient.   A list for source of those nutrients were also provided to the patient.  Patient will use only water or unsweetened tea for hydration. B.  The need to stay away from risky substances including alcohol, smoking; obtaining 7 to 9 hours of restorative sleep, at least 150 minutes of moderate intensity exercise weekly, the importance of healthy social connections,  and stress management techniques were discussed. C.  A full color page of  Calorie density of various food groups per pound showing examples of each food groups was provided to the patient.   - she will be scheduled with Norm Salt, RDN, CDE for individualized diabetes education.  - I have approached her with the following plan to manage  her diabetes and patient agrees:   -Unfortunately, her engagement for lifestyle medicine is not optimal.  She cannot afford Jardiance hence discontinued.    She will need insulin treatment.  I discussed and initiated Tresiba 20 units nightly.  I demonstrated insulin injection technique in the exam room with a sample.   She will continue to monitor blood glucose 4 times a day-before meals and at bedtime and return in 2 weeks with her meter and logs for evaluation. This patient will benefit from a CGM.  She will be considered for the freestyle libre device next visit. She is advised to continue metformin 1000 mg p.o. twice daily, advised to lower glipizide to 5 mg p.o. daily at breakfast.  - she is encouraged to call clinic for blood glucose levels less than 70 or above 200 mg /dl. - she will be considered for incretin therapy as appropriate next visit  if she presents with persistent hyperglycemia averaging greater than 250  mg/day.   - Specific targets for  A1c;  LDL, HDL,  and Triglycerides were discussed with the patient.  2) Blood Pressure /Hypertension:    Her blood pressure is controlled to target.   she is advised to continue her current medications including Imdur 60 mg p.o. daily with breakfast .   3) Lipids/Hyperlipidemia:   Review of her recent lipid panel showed uncontrolled hypertriglyceridemia at 421, LDL at 55.   She is advised to continue on her current triple agent therapy including fenofibrate 145 mg p.o. daily, omega-3 fatty acids 1000 mg p.o. twice daily, and Crestor 10 mg p.o. daily at bedtime.  She has a chance to simplify this treatment with proper engagement in lifestyle medicine.     Side effects and precautions discussed with her.  4)  Weight/Diet:  Body mass index is 36.87 kg/m.  -   clearly complicating her diabetes care.   she is  a candidate for weight loss. I discussed with her the fact that loss of 5 - 10% of her  current body weight will have the most impact on her diabetes management.  The above detailed  ACLM recommendations for nutrition, exercise, sleep, social life, avoidance of risky substances, the need for restorative sleep   information will also detailed on  discharge instructions.  5) hypothyroidism: The circumstances of her diagnosis are not available to review.  She is currently on levothyroxine 25 mcg p.o. daily before breakfast.  Her recent TSH was on target at 2.7.  She is advised to continue her current dose.   - We discussed about the correct intake of her thyroid hormone, on empty stomach at fasting, with water, separated by at least 30 minutes from breakfast and other medications,  and separated by more than 4 hours from calcium, iron, multivitamins, acid reflux medications (PPIs). -Patient is made aware of the fact that thyroid hormone replacement is needed for life, dose to be adjusted by periodic monitoring of thyroid function tests.    6) Chronic  Care/Health Maintenance:  -she  is on Statin medications and  is encouraged to initiate and continue to follow up with Ophthalmology, Dentist,  Podiatrist at least yearly or according to recommendations, and advised to   stay away from smoking. I have recommended yearly flu vaccine and pneumonia vaccine at least every 5 years; moderate intensity exercise for up to 150 minutes weekly; and  sleep for 7- 9 hours a day.  -She will need to have vitamin D supplement with vitamin D3 5000 units daily x90 days.  - she is  advised to maintain close follow up with Royann Shivers, PA-C for primary care needs, as well as her other providers for optimal and coordinated care.  I spent 41 minutes in the care of the patient today including review of labs from CMP, Lipids, Thyroid Function, Hematology (current and previous including abstractions from other facilities); face-to-face time discussing  her blood glucose readings/logs, discussing hypoglycemia and hyperglycemia episodes and symptoms, medications doses, her options of short and long term treatment based on the latest standards of care / guidelines;  discussion about incorporating lifestyle medicine;  and documenting the encounter.    Please refer to Patient Instructions for Blood Glucose Monitoring and Insulin/Medications Dosing Guide"  in media tab for additional information. Please  also refer to " Patient Self Inventory" in the Media  tab for reviewed elements of pertinent patient history.  Shaun Zuccaro Golinski participated in the discussions, expressed understanding, and voiced agreement with the above plans.  All questions were answered to her satisfaction. she is encouraged to contact clinic should she have any questions or concerns prior to her return visit.  Follow up plan: - Return in about 2 weeks (around 01/20/2022) for F/U with Meter/CGM Megan Salon Only - no Labs.  Marquis Lunch, MD [facility] [address] [place], [place] [address] Phone: [phone]  Fax: [phone]    01/06/2022, 2:04 PM  This note was partially dictated with voice recognition software. Similar sounding words can be transcribed inadequately or may not  be corrected upon review.

## 2022-01-06 NOTE — Patient Instructions (Signed)

## 2022-01-07 ENCOUNTER — Encounter: Payer: Self-pay | Admitting: *Deleted

## 2022-01-07 ENCOUNTER — Encounter: Payer: Medicare HMO | Admitting: *Deleted

## 2022-01-08 ENCOUNTER — Ambulatory Visit (INDEPENDENT_AMBULATORY_CARE_PROVIDER_SITE_OTHER): Payer: Medicare HMO | Admitting: *Deleted

## 2022-01-08 ENCOUNTER — Telehealth: Payer: Self-pay | Admitting: "Endocrinology

## 2022-01-08 DIAGNOSIS — I4891 Unspecified atrial fibrillation: Secondary | ICD-10-CM

## 2022-01-08 DIAGNOSIS — Z5181 Encounter for therapeutic drug level monitoring: Secondary | ICD-10-CM

## 2022-01-08 MED ORDER — WARFARIN SODIUM 5 MG PO TABS: 5.0000 mg | ORAL_TABLET | Freq: Every day | ORAL | 0 refills | Status: DC

## 2022-01-08 NOTE — Patient Instructions (Signed)
Description   Pt's last dose of Eliquis was 5/23 ( pt ran out) Pt starting Warfarin 5/25, prescription sent.  Recheck INR  5/30  A full discussion of the nature of anticoagulants has been carried out.  A benefit risk analysis has been presented to the patient, so that they understand the justification for choosing anticoagulation at this time. The need for frequent and regular monitoring, precise dosage adjustment and compliance is stressed.  Side effects of potential bleeding are discussed.  The patient should avoid any OTC items containing aspirin or ibuprofen, and should avoid great swings in general diet.  Avoid alcohol consumption.  Call if any signs of abnormal bleeding.

## 2022-01-08 NOTE — Telephone Encounter (Signed)
Pt called with high BG readings.   Date Before breakfast Before lunch Before supper Bedtime  5/23 [phone]  5/24 [phone]  5/25 213

## 2022-01-08 NOTE — Telephone Encounter (Signed)
Patient made aware.

## 2022-01-13 ENCOUNTER — Ambulatory Visit (INDEPENDENT_AMBULATORY_CARE_PROVIDER_SITE_OTHER): Payer: Medicare HMO | Admitting: *Deleted

## 2022-01-13 DIAGNOSIS — I4821 Permanent atrial fibrillation: Secondary | ICD-10-CM | POA: Diagnosis not present

## 2022-01-13 DIAGNOSIS — Z5181 Encounter for therapeutic drug level monitoring: Secondary | ICD-10-CM

## 2022-01-13 LAB — POCT INR: INR: 2.6 (ref 2.0–3.0)

## 2022-01-13 NOTE — Progress Notes (Signed)
This encounter was created in error - please disregard.

## 2022-01-13 NOTE — Progress Notes (Unsigned)
This encounter was created in error - please disregard.

## 2022-01-13 NOTE — Patient Instructions (Signed)
Pt started Warfarin 1 tablet (5mg ) daily on 5/25 Continue warfarin 5mg  daily Recheck in 1 wk

## 2022-01-14 ENCOUNTER — Ambulatory Visit: Payer: Medicare HMO | Admitting: "Endocrinology

## 2022-01-14 NOTE — Telephone Encounter (Signed)
Pt called back and stated that her #'s are still staying consistent over 200. This morning it was 208 and at lunch today it was 235. She did increase to 30 units. Please Advise

## 2022-01-15 NOTE — Telephone Encounter (Signed)
Patient was made aware

## 2022-01-19 ENCOUNTER — Ambulatory Visit (INDEPENDENT_AMBULATORY_CARE_PROVIDER_SITE_OTHER): Payer: Medicare HMO | Admitting: *Deleted

## 2022-01-19 DIAGNOSIS — Z5181 Encounter for therapeutic drug level monitoring: Secondary | ICD-10-CM

## 2022-01-19 DIAGNOSIS — I4821 Permanent atrial fibrillation: Secondary | ICD-10-CM | POA: Diagnosis not present

## 2022-01-19 LAB — POCT INR: INR: 3.4 — AB (ref 2.0–3.0)

## 2022-01-19 NOTE — Patient Instructions (Signed)
Pt started Warfarin 1 tablet (5mg ) daily on 5/25 Hold warfarin tonight then decrease dose to 1 tablet daily except 1/2 tablet on Mondays and Thursdays Recheck in 1 wk

## 2022-01-28 ENCOUNTER — Encounter: Payer: Self-pay | Admitting: "Endocrinology

## 2022-01-28 ENCOUNTER — Ambulatory Visit (INDEPENDENT_AMBULATORY_CARE_PROVIDER_SITE_OTHER): Payer: Medicare HMO | Admitting: "Endocrinology

## 2022-01-28 VITALS — BP 132/62 | HR 60 | Ht 64.0 in | Wt 218.6 lb

## 2022-01-28 DIAGNOSIS — I10 Essential (primary) hypertension: Secondary | ICD-10-CM

## 2022-01-28 DIAGNOSIS — E782 Mixed hyperlipidemia: Secondary | ICD-10-CM

## 2022-01-28 DIAGNOSIS — E039 Hypothyroidism, unspecified: Secondary | ICD-10-CM | POA: Diagnosis not present

## 2022-01-28 DIAGNOSIS — E1159 Type 2 diabetes mellitus with other circulatory complications: Secondary | ICD-10-CM | POA: Diagnosis not present

## 2022-01-28 DIAGNOSIS — E559 Vitamin D deficiency, unspecified: Secondary | ICD-10-CM

## 2022-01-28 MED ORDER — TRESIBA FLEXTOUCH 200 UNIT/ML ~~LOC~~ SOPN: 60.0000 [IU] | PEN_INJECTOR | Freq: Every day | SUBCUTANEOUS | 2 refills | Status: DC

## 2022-01-28 MED ORDER — FREESTYLE LIBRE 2 SENSOR MISC: 1.0000 | 3 refills | Status: DC

## 2022-01-28 MED ORDER — FREESTYLE LIBRE 2 READER DEVI: 0 refills | Status: DC

## 2022-01-28 NOTE — Progress Notes (Signed)
01/28/2022, 9:59 PM   Endocrinology follow-up note  Subjective:    Patient ID: Rachel Odonnell, female    DOB: Jul 15, 1956.  Rachel Odonnell is being seen in follow-up after she was seen in consultation for  management of currently uncontrolled symptomatic diabetes requested by  Royann Shivers, PA-C.   Past Medical History:  Diagnosis Date   ADD (attention deficit disorder)    Diabetes mellitus without complication (HCC)    Hypertension     Past Surgical History:  Procedure Laterality Date   ABDOMINAL HYSTERECTOMY     EXCISIONAL HEMORRHOIDECTOMY     INCONTINENCE SURGERY     RIGHT/LEFT HEART CATH AND CORONARY ANGIOGRAPHY N/A 06/13/2021   Procedure: RIGHT/LEFT HEART CATH AND CORONARY ANGIOGRAPHY;  Surgeon: [place], Peter M, MD;  Location: [facility] INVASIVE CV LAB;  Service: Cardiovascular;  Laterality: N/A;    Social History   Socioeconomic History   Marital status: Married    Spouse name: Shon Hale   Number of children: Not on file   Years of education: Not on file   Highest education level: Not on file  Occupational History   Not on file  Tobacco Use   Smoking status: Former    Packs/day: 2.00    Types: Cigarettes    Quit date: 08/17/1994    Years since quitting: 27.4   Smokeless tobacco: Never  Vaping Use   Vaping Use: Never used  Substance and Sexual Activity   Alcohol use: No   Drug use: No   Sexual activity: Not on file  Other Topics Concern   Not on file  Social History Narrative   Lives at home with husband, Shon Hale   Former smoker Smoked 2 ppd x 25 yrs; Quit in 1996   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, retail  retired Secondary school teacher, still works part-time at the [employer], used to work Editor, commissioning   Now on fixed income from her SSI and pension   Social Determinants of Health   Financial Resource Strain: Low Risk  (10/10/2021)   Overall Financial  Resource Strain (CARDIA)    Difficulty of Paying Living Expenses: Not hard at all  Food Insecurity: No Food Insecurity (10/10/2021)   Hunger Vital Sign    Worried About Running Out of Food in the Last Year: Never true    Ran Out of Food in the Last Year: Never true  Transportation Needs: No Transportation Needs (10/10/2021)   PRAPARE - Administrator, Civil Service (Medical): No    Lack of Transportation (Non-Medical): No  Physical Activity: Unknown (04/18/2021)   Exercise Vital Sign    Days of Exercise per Week: 1 day    Minutes of Exercise per Session: Not on file  Stress: No Stress Concern Present (10/10/2021)   [employer] of Occupational Health - Occupational Stress Questionnaire    Feeling of Stress : Only a little  Social Connections: Socially Integrated (10/10/2021)   Social Connection and Isolation Panel [NHANES]    Frequency of Communication with Friends and Family: More than three times a week    Frequency of Social Gatherings with  Friends and Family: More than three times a week    Attends Religious Services: More than 4 times per year    Active Member of Clubs or Organizations: Yes    Attends Engineer, structural: More than 4 times per year    Marital Status: Married    Family History  Problem Relation Age of Onset   Cancer Mother    Diabetes Father    Heart Problems Father    COPD Father    Emphysema Father    Heart block Father    Congestive Heart Failure Father     Outpatient Encounter Medications as of 01/28/2022  Medication Sig   Continuous Blood Gluc Receiver (FREESTYLE LIBRE 2 READER) DEVI As directed   Continuous Blood Gluc Sensor (FREESTYLE LIBRE 2 SENSOR) MISC 1 Piece by Does not apply route every 14 (fourteen) days.   busPIRone (BUSPAR) 5 MG tablet Take 5 mg by mouth 3 (three) times daily.   Cholecalciferol (VITAMIN D3) 125 MCG (5000 UT) CAPS Take 1 capsule (5,000 Units total) by mouth daily.   citalopram (CELEXA) 40 MG tablet  Take 40 mg by mouth every evening.   estradiol (ESTRACE) 2 MG tablet Take 2 mg by mouth every evening.   EUTHYROX 25 MCG tablet Take 25 mcg by mouth daily before breakfast.   fenofibrate (TRICOR) 145 MG tablet Take 145 mg by mouth every evening.   fluorometholone (FML) 0.1 % ophthalmic suspension Place 1 drop into both eyes 3 (three) times daily as needed (allergy eyes.).   gabapentin (NEURONTIN) 100 MG capsule Take 1 capsule (100 mg total) by mouth 3 (three) times daily. (Patient taking differently: Take 100 mg by mouth at bedtime.)   glipiZIDE (GLUCOTROL) 5 MG tablet Take 1 tablet (5 mg total) by mouth daily before breakfast.   glucose blood (CONTOUR TEST) test strip Use as instructed   insulin degludec (TRESIBA FLEXTOUCH) 200 UNIT/ML FlexTouch Pen Inject 60 Units into the skin at bedtime.   Insulin Pen Needle (B-D ULTRAFINE III SHORT PEN) 31G X 8 MM MISC 1 each by Does not apply route as directed.   isosorbide mononitrate (IMDUR) 60 MG 24 hr tablet Take 1 tablet (60 mg total) by mouth daily.   levocetirizine (XYZAL) 5 MG tablet Take 5 mg by mouth every evening.   metFORMIN (GLUCOPHAGE) 500 MG tablet Take 2 tablets (1,000 mg total) by mouth 2 (two) times daily with a meal.   modafinil (PROVIGIL) 100 MG tablet Take 1 tablet (100 mg total) by mouth daily.   montelukast (SINGULAIR) 10 MG tablet Take 10 mg by mouth at bedtime.   Multiple Vitamin (MULTIVITAMIN ADULT PO) Take 1 tablet by mouth daily.   Omega-3 Fatty Acids (FISH OIL) 1000 MG CAPS Take 1,000 mg by mouth every evening.   omeprazole (PRILOSEC) 40 MG capsule Take 40 mg by mouth every evening.   rosuvastatin (CRESTOR) 10 MG tablet Take 10 mg by mouth every evening.   warfarin (COUMADIN) 5 MG tablet Take 5 mg by mouth daily. Or as directed by coumadin clinic   warfarin (COUMADIN) 5 MG tablet Take 1 tablet (5 mg total) by mouth daily.   [DISCONTINUED] insulin degludec (TRESIBA FLEXTOUCH) 200 UNIT/ML FlexTouch Pen Inject 20 Units into the  skin at bedtime. (Patient taking differently: Inject 50 Units into the skin at bedtime.)   No facility-administered encounter medications on file as of 01/28/2022.    ALLERGIES: Allergies  Allergen Reactions   Azithromycin Hives   Codeine Nausea Only  Atomoxetine Nausea And Vomiting   Atorvastatin Other (See Comments)    Restless legs   Lisinopril Nausea And Vomiting    VACCINATION STATUS: Immunization History  Administered Date(s) Administered   Fluad Quad(high Dose 65+) 05/14/2021   Moderna SARS-COV2 Booster Vaccination 06/13/2020   Moderna Sars-Covid-2 Vaccination 09/06/2019, 10/04/2019   Pneumococcal Polysaccharide-23 04/22/2017   Pneumococcal-Unspecified 05/31/2020   Tdap 04/22/2017   Zoster Recombinat (Shingrix) 11/30/2017, 02/20/2018    Diabetes She presents for her follow-up diabetic visit. She has type 2 diabetes mellitus. Onset time: Patient was diagnosed at approximate age of 49 years. Her disease course has been worsening. There are no hypoglycemic associated symptoms. Pertinent negatives for hypoglycemia include no confusion, headaches, pallor or seizures. Pertinent negatives for diabetes include no blurred vision, no chest pain, no fatigue, no polydipsia, no polyphagia and no polyuria. There are no hypoglycemic complications. Symptoms are improving. Diabetic complications include heart disease and peripheral neuropathy. Risk factors for coronary artery disease include dyslipidemia, diabetes mellitus, family history, obesity, post-menopausal, sedentary lifestyle, tobacco exposure and hypertension. Current diabetic treatments: She is currently on metformin 1000 mg p.o. twice daily, glipizide 7.5 mg p.o. daily. Her weight is fluctuating minimally. She is following a generally unhealthy diet. When asked about meal planning, she reported none. She never participates in exercise. Her home blood glucose trend is decreasing steadily. Her breakfast blood glucose range is generally  140-180 mg/dl. Her lunch blood glucose range is generally 180-200 mg/dl. Her dinner blood glucose range is generally 180-200 mg/dl. Her bedtime blood glucose range is generally 180-200 mg/dl. Her overall blood glucose range is 180-200 mg/dl. (She presents with a meter  and a log showing recent improvement in her glycemic profile. 14 days EAG of 192 improving from  295-307 previous  14 days .  Her most recent A1c was 11.5%.  She was initiated on basal insulin, currently on Tresiba 50  units qns. ) An ACE inhibitor/angiotensin II receptor blocker is not being taken. Eye exam is current.  Hyperlipidemia This is a chronic problem. The current episode started more than 1 year ago. The problem is uncontrolled. Exacerbating diseases include diabetes, hypothyroidism and obesity. Pertinent negatives include no chest pain, myalgias or shortness of breath. Current antihyperlipidemic treatment includes statins, fibric acid derivatives and bile acid sequestrants. Risk factors for coronary artery disease include family history, dyslipidemia, obesity, a sedentary lifestyle, post-menopausal, hypertension and diabetes mellitus.  Hypertension This is a chronic problem. The current episode started more than 1 year ago. Pertinent negatives include no blurred vision, chest pain, headaches, palpitations or shortness of breath. Risk factors for coronary artery disease include dyslipidemia, diabetes mellitus, family history, obesity, post-menopausal state, smoking/tobacco exposure and sedentary lifestyle. Past treatments include direct vasodilators. Hypertensive end-organ damage includes CAD/MI.     Review of Systems  Constitutional:  Negative for chills, fatigue, fever and unexpected weight change.  HENT:  Negative for trouble swallowing and voice change.   Eyes:  Negative for blurred vision and visual disturbance.  Respiratory:  Negative for cough, shortness of breath and wheezing.   Cardiovascular:  Negative for chest  pain, palpitations and leg swelling.  Gastrointestinal:  Negative for diarrhea, nausea and vomiting.  Endocrine: Negative for cold intolerance, heat intolerance, polydipsia, polyphagia and polyuria.  Musculoskeletal:  Negative for arthralgias and myalgias.  Skin:  Negative for color change, pallor, rash and wound.  Neurological:  Negative for seizures and headaches.  Psychiatric/Behavioral:  Negative for confusion and suicidal ideas.     Objective:  01/28/2022    1:22 PM 01/06/2022   10:40 AM 12/23/2021    3:29 PM  Vitals with BMI  Height 5\' 4"  5\' 4"  5\' 4"   Weight 218 lbs 10 oz 214 lbs 13 oz 214 lbs 3 oz  BMI 37.5 36.85 36.75  Systolic 132 110  Diastolic 62 50 52  Pulse 60 64 64    BP 132/62   Pulse 60   Ht 5\' 4"  (1.626 m)   Wt 218 lb 9.6 oz (99.2 kg)   BMI 37.52 kg/m   Wt Readings from Last 3 Encounters:  01/28/22 218 lb 9.6 oz (99.2 kg)  01/06/22 214 lb 12.8 oz (97.4 kg)  12/23/21 214 lb 3.2 oz (97.2 kg)         CMP ( most recent) CMP     Component Value Date/Time   NA 137 06/13/2021 1030   NA 135 06/13/2021 1030   K 4.3 06/13/2021 1030   K 4.3 06/13/2021 1030   CL 99 06/11/2021 1331   CO2 25 06/11/2021 1331   GLUCOSE 338 (H) 06/11/2021 1331   BUN 12 09/25/2021 0000   CREATININE 0.8 09/25/2021 0000   CREATININE 0.88 06/11/2021 1331   CALCIUM 9.8 09/25/2021 0000   PROT 7.0 07/09/2012 1736   ALBUMIN 3.6 07/09/2012 1736   AST 42 (A) 09/25/2021 0000   ALT 29 09/25/2021 0000   ALKPHOS 72 07/09/2012 1736   BILITOT 0.2 (L) 07/09/2012 1736   GFRNONAA >60 06/11/2021 1331   GFRAA >90 07/09/2012 1736    Recent Results (from the past 2160 hour(s))  HgB A1c     Status: Abnormal   Collection Time: 12/23/21  4:10 PM  Result Value Ref Range   Hemoglobin A1C     HbA1c POC (<> result, manual entry)     HbA1c, POC (prediabetic range)     HbA1c, POC (controlled diabetic range) 11.5 (A) 0.0 - 7.0 %  POCT INR     Status: Normal   Collection Time: 01/13/22   3:28 PM  Result Value Ref Range   INR 2.6 2.0 - 3.0  POCT INR     Status: Abnormal   Collection Time: 01/19/22  2:29 PM  Result Value Ref Range   INR 3.4 (A) 2.0 - 3.0     Assessment & Plan:   1. DM type 2 causing vascular disease (HCC) 2. Mixed hyperlipidemia 3. Essential hypertension, benign 4. Hypothyroidism, unspecified type 5. Vitamin D deficiency 6. Class 2 severe obesity due to excess calories with serious comorbidity and body mass index  of 37.0 to 37.9 in adult (HCC) 7. Non-alcoholic fatty liver disease 8. Sleep apnea, unspecified type  - Rachel Odonnell has currently uncontrolled symptomatic type 2 DM since  66 years of age.   She presents with a meter  and a log showing recent improvement in her glycemic profile. 14 days EAG of 192 improving from  295-307 previous  14 days .  Her most recent A1c was 11.5%.  She was initiated on basal insulin, currently on Tresiba 50  units qns.   Recent labs reviewed. - I had a long discussion with her about the progressive nature of diabetes and the pathology behind its complications. -her diabetes is complicated by coronary artery disease, peripheral neuropathy, obesity, sedentary life, nonalcoholic fatty liver disease and she remains at extremely  high risk for more acute and chronic complications which include CAD, CVA, CKD, retinopathy, and neuropathy. These are all discussed in detail with her.  -  I discussed all available options of managing her diabetes including de-escalation of medications.  Considering her associated comorbidities including sleep apnea requiring CPAP, nonalcoholic fatty liver disease, hypertension, hyperlipidemia, she is a perfect candidate for lifestyle medicine.    I have counseled her on diet  and weight management  by adopting a Whole Food , Plant Predominant  ( WFPP) nutrition as recommended by [employer] of Lifestyle Medicine. Patient is encouraged to switch to  unprocessed or minimally processed   complex starch, adequate protein intake (mainly plant source), minimal liquid fat ( mainly vegetable oils), plenty of fruits, and vegetables. -  she is advised to stick to a routine mealtimes to eat 3 complete meals a day and snack only when necessary ( to snack only to correct hypoglycemia BG <70 day time or <100 at night).   -- she acknowledges that there is a room for improvement in her food and drink choices. - Suggestion is made for her to avoid simple carbohydrates  from her diet including Cakes, Sweet Desserts, Ice Cream, Soda (diet and regular), Sweet Tea, Candies, Chips, Cookies, Store Bought Juices, Alcohol , Artificial Sweeteners,  Coffee Creamer, and "Sugar-free" Products, Lemonade. This will help patient to have more stable blood glucose profile and potentially avoid unintended weight gain.  The following Lifestyle Medicine recommendations according to American College of Lifestyle Medicine  [facility]) were discussed and and offered to patient and she  agrees to start the journey:  A. Whole Foods, Plant-Based Nutrition comprising of fruits and vegetables, plant-based proteins, whole-grain carbohydrates was discussed in detail with the patient.   A list for source of those nutrients were also provided to the patient.  Patient will use only water or unsweetened tea for hydration. B.  The need to stay away from risky substances including alcohol, smoking; obtaining 7 to 9 hours of restorative sleep, at least 150 minutes of moderate intensity exercise weekly, the importance of healthy social connections,  and stress management techniques were discussed. C.  A full color page of  Calorie density of various food groups per pound showing examples of each food groups was provided to the patient.    - she has been scheduled with Norm Salt, RDN, CDE for individualized diabetes education.  - I have approached her with the following plan to manage  her diabetes and patient agrees:    -Unfortunately, her engagement for lifestyle medicine is not optimal.  She cannot afford Jardiance hence discontinued.    She will continue to  need insulin treatment.  I discussed and increased her Tresiba to 60 units nightly.   She will continue to monitor blood glucose 4 times a day-before meals and at bedtime and return in 2 weeks with her meter and logs for evaluation. This patient will benefit from a CGM.  I will prescribe the Freestyle Libre device for her.  She is advised to continue metformin 1000 mg p.o. twice daily, advised to lower glipizide to 5 mg p.o. daily at breakfast.  - she is encouraged to call clinic for blood glucose levels less than 70 or above 200 mg /dl. - she will be considered for incretin therapy as appropriate next visit  if she presents with persistent  postprandial-hyperglycemia averaging greater than 250 mg/day.   - Specific targets for  A1c;  LDL, HDL,  and Triglycerides were discussed with the patient.  2) Blood Pressure /Hypertension:    Her blood pressure is controlled to target.  she is advised to continue her  current medications including Imdur 60 mg p.o. daily with breakfast .   3) Lipids/Hyperlipidemia:   Review of her recent lipid panel showed uncontrolled hypertriglyceridemia at 421, LDL at 55.   She is advised to continue on her current triple agent therapy including fenofibrate 145 mg p.o. daily, omega-3 fatty acids 1000 mg p.o. twice daily, and Crestor 10 mg p.o. daily at bedtime.  She has a chance to simplify this treatment with proper engagement in lifestyle medicine.     Side effects and precautions discussed with her.  4)  Weight/Diet:  Body mass index is 37.52 kg/m.  -   clearly complicating her diabetes care.   she is  a candidate for weight loss. I discussed with her the fact that loss of 5 - 10% of her  current body weight will have the most impact on her diabetes management.  The above detailed  ACLM recommendations for nutrition,  exercise, sleep, social life, avoidance of risky substances, the need for restorative sleep   information will also detailed on discharge instructions.  5) hypothyroidism: The circumstances of her diagnosis are not available to review.  She is currently on levothyroxine 25 mcg p.o. daily before breakfast.  Her recent TSH was on target at 2.7.  She is advised to continue her current dose.   - We discussed about the correct intake of her thyroid hormone, on empty stomach at fasting, with water, separated by at least 30 minutes from breakfast and other medications,  and separated by more than 4 hours from calcium, iron, multivitamins, acid reflux medications (PPIs). -Patient is made aware of the fact that thyroid hormone replacement is needed for life, dose to be adjusted by periodic monitoring of thyroid function tests.   6) Chronic Care/Health Maintenance:  -she  is on Statin medications and  is encouraged to initiate and continue to follow up with Ophthalmology, Dentist,  Podiatrist at least yearly or according to recommendations, and advised to   stay away from smoking. I have recommended yearly flu vaccine and pneumonia vaccine at least every 5 years; moderate intensity exercise for up to 150 minutes weekly; and  sleep for 7- 9 hours a day.  -She will need to have vitamin D supplement with vitamin D3 5000 units daily x90 days.  - she is  advised to maintain close follow up with Royann Shivers, PA-C for primary care needs, as well as her other providers for optimal and coordinated care.    I spent 41 minutes in the care of the patient today including review of labs from CMP, Lipids, Thyroid Function, Hematology (current and previous including abstractions from other facilities); face-to-face time discussing  her blood glucose readings/logs, discussing hypoglycemia and hyperglycemia episodes and symptoms, medications doses, her options of short and long term treatment based on the latest  standards of care / guidelines;  discussion about incorporating lifestyle medicine;  and documenting the encounter.    Please refer to Patient Instructions for Blood Glucose Monitoring and Insulin/Medications Dosing Guide"  in media tab for additional information. Please  also refer to " Patient Self Inventory" in the Media  tab for reviewed elements of pertinent patient history.  Tashyra Adduci Givans participated in the discussions, expressed understanding, and voiced agreement with the above plans.  All questions were answered to her satisfaction. she is encouraged to contact clinic should she have any questions or concerns prior to her return visit.   Follow up plan: - Return in about 9 weeks (around 04/01/2022)  for F/U with Pre-visit Labs, Meter/CGM/Logs, A1c here, Urine MA - NV.  Marquis LunchGebre Vianne Grieshop, MD [facility] [address] [place], [place] [address] Phone: [phone]  Fax: [phone]    01/28/2022, 9:59 PM  This note was partially dictated with voice recognition software. Similar sounding words can be transcribed inadequately or may not  be corrected upon review.

## 2022-01-28 NOTE — Patient Instructions (Signed)
                                     Advice for Weight Management  -For most of us the best way to lose weight is by diet management. Generally speaking, diet management means consuming less calories intentionally which over time brings about progressive weight loss.  This can be achieved more effectively by avoiding ultra processed carbohydrates, processed meats, unhealthy fats.    It is critically important to know your numbers: how much calorie you are consuming and how much calorie you need. More importantly, our carbohydrates sources should be unprocessed naturally occurring  complex starch food items.  It is always important to balance nutrition also by  appropriate intake of proteins (mainly plant-based), healthy fats/oils, plenty of fruits and vegetables.   -The American College of Lifestyle Medicine (ACL M) recommends nutrition derived mostly from Whole Food, Plant Predominant Sources example an apple instead of applesauce or apple pie. Eat Plenty of vegetables, Mushrooms, fruits, Legumes, Whole Grains, Nuts, seeds in lieu of processed meats, processed snacks/pastries red meat, poultry, eggs.  Use only water or unsweetened tea for hydration.  The College also recommends the need to stay away from risky substances including alcohol, smoking; obtaining 7-9 hours of restorative sleep, at least 150 minutes of moderate intensity exercise weekly, importance of healthy social connections, and being mindful of stress and seek help when it is overwhelming.    -Sticking to a routine mealtime to eat 3 meals a day and avoiding unnecessary snacks is shown to have a big role in weight control. Under normal circumstances, the only time we burn stored energy is when we are hungry, so allow  some hunger to take place- hunger means no food between appropriate meal times, only water.  It is not advisable to starve.   -It is better to avoid simple carbohydrates including:  Cakes, Sweet Desserts, Ice Cream, Soda (diet and regular), Sweet Tea, Candies, Chips, Cookies, Store Bought Juices, Alcohol in Excess of  1-2 drinks a day, Lemonade,  Artificial Sweeteners, Doughnuts, Coffee Creamers, "Sugar-free" Products, etc, etc.  This is not a complete list.....    -Consulting with certified diabetes educators is proven to provide you with the most accurate and current information on diet.  Also, you may be  interested in discussing diet options/exchanges , we can schedule a visit with Penny Crumpton, RDN, CDE for individualized nutrition education.  -Exercise: If you are able: 30 -60 minutes a day ,4 days a week, or 150 minutes of moderate intensity exercise weekly.    The longer the better if tolerated.  Combine stretch, strength, and aerobic activities.  If you were told in the past that you have high risk for cardiovascular diseases, or if you are currently symptomatic, you may seek evaluation by your heart doctor prior to initiating moderate to intense exercise programs.                                  Additional Care Considerations for Diabetes   -Diabetes  is a chronic disease.  The most important care consideration is regular follow-up with your diabetes care provider with the goal being avoiding or delaying its complications and to take advantage of advances in medications and technology.  If appropriate actions are taken early enough, type 2 diabetes can even be   reversed.  Seek information from the right source.  - Whole Food, Plant Predominant Nutrition is highly recommended: Eat Plenty of vegetables, Mushrooms, fruits, Legumes, Whole Grains, Nuts, seeds in lieu of processed meats, processed snacks/pastries red meat, poultry, eggs as recommended by American College of  Lifestyle Medicine (ACLM).  -Type 2 diabetes is known to coexist with other important comorbidities such as high blood pressure and high cholesterol.  It is critical to control not only the diabetes but  also the high blood pressure and high cholesterol to minimize and delay the risk of complications including coronary artery disease, stroke, amputations, blindness, etc.  The good news is that this diet recommendation for type 2 diabetes is also very helpful for managing high cholesterol and high blood blood pressure.  - Studies showed that people with diabetes will benefit from a class of medications known as ACE inhibitors and statins.  Unless there are specific reasons not to be on these medications, the standard of care is to consider getting one from these groups of medications at an optimal doses.  These medications are generally considered safe and proven to help protect the heart and the kidneys.    - People with diabetes are encouraged to initiate and maintain regular follow-up with eye doctors, foot doctors, dentists , and if necessary heart and kidney doctors.     - It is highly recommended that people with diabetes quit smoking or stay away from smoking, and get yearly  flu vaccine and pneumonia vaccine at least every 5 years.  See above for additional recommendations on exercise, sleep, stress management , and healthy social connections.      

## 2022-01-29 ENCOUNTER — Ambulatory Visit (INDEPENDENT_AMBULATORY_CARE_PROVIDER_SITE_OTHER): Payer: Medicare HMO | Admitting: *Deleted

## 2022-01-29 ENCOUNTER — Other Ambulatory Visit: Payer: Self-pay | Admitting: Cardiology

## 2022-01-29 DIAGNOSIS — I4821 Permanent atrial fibrillation: Secondary | ICD-10-CM

## 2022-01-29 DIAGNOSIS — Z5181 Encounter for therapeutic drug level monitoring: Secondary | ICD-10-CM | POA: Diagnosis not present

## 2022-01-29 DIAGNOSIS — I4891 Unspecified atrial fibrillation: Secondary | ICD-10-CM

## 2022-01-29 LAB — POCT INR: INR: 3.9 — AB (ref 2.0–3.0)

## 2022-01-29 NOTE — Patient Instructions (Signed)
Pt started Warfarin 1 tablet (5mg ) daily on 5/25 Hold warfarin tonight then decrease dose to 1/2 tablet daily except 1 tablet on Sundays, Tuesdays and Thursdays Recheck in 2 wk

## 2022-01-30 NOTE — Telephone Encounter (Signed)
Prescription refill request received for warfarin Lov:  [facility] 12/11/2021 Next INR check: 7/3 Warfarin tablet strength: 5mg    Refill sent.

## 2022-02-03 DIAGNOSIS — F339 Major depressive disorder, recurrent, unspecified: Secondary | ICD-10-CM | POA: Diagnosis not present

## 2022-02-03 DIAGNOSIS — E1165 Type 2 diabetes mellitus with hyperglycemia: Secondary | ICD-10-CM | POA: Diagnosis not present

## 2022-02-13 DIAGNOSIS — E039 Hypothyroidism, unspecified: Secondary | ICD-10-CM | POA: Diagnosis not present

## 2022-02-13 DIAGNOSIS — K219 Gastro-esophageal reflux disease without esophagitis: Secondary | ICD-10-CM | POA: Diagnosis not present

## 2022-02-13 DIAGNOSIS — E1165 Type 2 diabetes mellitus with hyperglycemia: Secondary | ICD-10-CM | POA: Diagnosis not present

## 2022-02-14 HISTORY — PX: COLONOSCOPY: SHX174

## 2022-02-16 ENCOUNTER — Ambulatory Visit (INDEPENDENT_AMBULATORY_CARE_PROVIDER_SITE_OTHER): Payer: Medicare HMO | Admitting: *Deleted

## 2022-02-16 DIAGNOSIS — Z5181 Encounter for therapeutic drug level monitoring: Secondary | ICD-10-CM | POA: Diagnosis not present

## 2022-02-16 DIAGNOSIS — I4821 Permanent atrial fibrillation: Secondary | ICD-10-CM

## 2022-02-16 LAB — POCT INR: INR: 1.8 — AB (ref 2.0–3.0)

## 2022-02-16 NOTE — Patient Instructions (Signed)
Take warfarin extra 1/2 tablet tonight then resume 1/2 tablet daily except 1 tablet on Sundays, Tuesdays and Thursdays.  Start taking at night. Recheck in 2 wk

## 2022-02-18 DIAGNOSIS — M9902 Segmental and somatic dysfunction of thoracic region: Secondary | ICD-10-CM | POA: Diagnosis not present

## 2022-02-18 DIAGNOSIS — S338XXA Sprain of other parts of lumbar spine and pelvis, initial encounter: Secondary | ICD-10-CM | POA: Diagnosis not present

## 2022-02-18 DIAGNOSIS — S233XXA Sprain of ligaments of thoracic spine, initial encounter: Secondary | ICD-10-CM | POA: Diagnosis not present

## 2022-02-18 DIAGNOSIS — M9903 Segmental and somatic dysfunction of lumbar region: Secondary | ICD-10-CM | POA: Diagnosis not present

## 2022-02-18 DIAGNOSIS — M9901 Segmental and somatic dysfunction of cervical region: Secondary | ICD-10-CM | POA: Diagnosis not present

## 2022-02-18 DIAGNOSIS — S134XXA Sprain of ligaments of cervical spine, initial encounter: Secondary | ICD-10-CM | POA: Diagnosis not present

## 2022-02-23 ENCOUNTER — Encounter: Payer: Medicare HMO | Attending: Physician Assistant | Admitting: Nutrition

## 2022-02-23 ENCOUNTER — Ambulatory Visit: Payer: Medicare HMO | Admitting: Nutrition

## 2022-02-23 VITALS — Ht 64.0 in | Wt 218.0 lb

## 2022-02-23 DIAGNOSIS — K760 Fatty (change of) liver, not elsewhere classified: Secondary | ICD-10-CM | POA: Insufficient documentation

## 2022-02-23 DIAGNOSIS — Z713 Dietary counseling and surveillance: Secondary | ICD-10-CM | POA: Insufficient documentation

## 2022-02-23 DIAGNOSIS — I10 Essential (primary) hypertension: Secondary | ICD-10-CM | POA: Insufficient documentation

## 2022-02-23 DIAGNOSIS — Z6837 Body mass index (BMI) 37.0-37.9, adult: Secondary | ICD-10-CM | POA: Insufficient documentation

## 2022-02-23 DIAGNOSIS — E1165 Type 2 diabetes mellitus with hyperglycemia: Secondary | ICD-10-CM

## 2022-02-23 DIAGNOSIS — E118 Type 2 diabetes mellitus with unspecified complications: Secondary | ICD-10-CM | POA: Diagnosis not present

## 2022-02-23 DIAGNOSIS — E782 Mixed hyperlipidemia: Secondary | ICD-10-CM | POA: Insufficient documentation

## 2022-02-23 DIAGNOSIS — E663 Overweight: Secondary | ICD-10-CM

## 2022-02-23 NOTE — Progress Notes (Signed)
Medical Nutrition Therapy  Appointment Start time:  1530  Appointment End time:  1630  Dm Typ2  Primary concerns today: DM Type 2 , NASH Referral diagnosis: E11,8, K 76 Preferred learning style: none Learning readiness: Ready    NUTRITION ASSESSMENT  66 yr old female with Type 2 DM, HTN, Hyperlipidemia, NASH  Sees Dr. Fransico Him, Endocrinology. She is working on lifestyle medicine. Currently on Glipizide 5 mg, Tresiba 60 units at night. Metformin 2 g per day; Has CGM LIBRE. BS ranges 295-307 mg/dl. A1C 11.5%.  Anthropometrics  Wt Readings from Last 3 Encounters:  02/23/22 218 lb (98.9 kg)  01/28/22 218 lb 9.6 oz (99.2 kg)  01/06/22 214 lb 12.8 oz (97.4 kg)   Ht Readings from Last 3 Encounters:  02/23/22 5\' 4"  (1.626 m)  01/28/22 5\' 4"  (1.626 m)  01/06/22 5\' 4"  (1.626 m)   Body mass index is 37.42 kg/m. @BMIFA @ Facility age limit for growth %iles is 20 years. Facility age limit for growth %iles is 20 years.    Clinical Medical Hx: See chart Medications: Tresiba, Glipizide, Metformin Labs:  Lab Results  Component Value Date   HGBA1C 11.5 (A) 12/23/2021      Latest Ref Rng & Units 09/25/2021   12:00 AM 06/13/2021   10:30 AM 06/13/2021   10:27 AM  CMP  BUN 4 - 21 12        Creatinine 0.5 - 1.1 0.8        Sodium 135 - 145 mmol/L 135 - 145 mmol/L  137    135  136   Potassium 3.5 - 5.1 mmol/L 3.5 - 5.1 mmol/L  4.3    4.3  4.3   Calcium 8.7 - 10.7 9.8        AST 13 - 35 42        ALT 7 - 35 U/L 29           This result is from an external source.   Multiple values from one day are sorted in reverse-chronological order   Lipid Panel     Component Value Date/Time   CHOL 148 09/25/2021 0000   TRIG 421 (A) 09/25/2021 0000   HDL 29 (A) 09/25/2021 0000   LDLCALC 55 09/25/2021 0000    Notable Signs/Symptoms: Increased thirsty, frequent urination.  Lifestyle & Dietary Hx Married . Hsuband dows cooking and shopping. Gouts out to eat.  Estimated daily fluid  intake: 30 oz Supplements: see chart Sleep: varies Stress / self-care: family  Current average weekly physical activity: none  24-Hr Dietary Recall First Meal: Oatmeal,  small box  rasins, cashews, and coc[place] chucnks Snack:  Second Meal: Sandwich or leftovers, soda Snack:  Third Meal: Meat and some veggies, water Snack:  Beverages: Sodas, juice, water  Estimated Energy Needs Calories: 1200 Carbohydrate: 135g Protein: 90g Fat: 33g   NUTRITION DIAGNOSIS  NB-1.1 Food and nutrition-related knowledge deficit As related to Diabetes Type 2.  As evidenced by A1C 11.5%.   NUTRITION INTERVENTION  Nutrition education (E-1) on the following topics:  Nutrition and Diabetes education provided on My Plate, CHO counting, meal planning, portion sizes, timing of meals, avoiding snacks between meals unless having a low blood sugar, target ranges for A1C and blood sugars, signs/symptoms and treatment of hyper/hypoglycemia, monitoring blood sugars, taking medications as prescribed, benefits of exercising 30 minutes per day and prevention of complications of DM.  Lifestyle Medicine  - Whole Food, Plant Predominant Nutrition is highly recommended: Eat Plenty of vegetables, Mushrooms,  fruits, Legumes, Whole Grains, Nuts, seeds in lieu of processed meats, processed snacks/pastries red meat, poultry, eggs.    -It is better to avoid simple carbohydrates including: Cakes, Sweet Desserts, Ice Cream, Soda (diet and regular), Sweet Tea, Candies, Chips, Cookies, Store Bought Juices, Alcohol in Excess of  1-2 drinks a day, Lemonade,  Artificial Sweeteners, Doughnuts, Coffee Creamers, "Sugar-free" Products, etc, etc.  This is not a complete list.....  Exercise: If you are able: 30 -60 minutes a day ,4 days a week, or 150 minutes a week.  The longer the better.  Combine stretch, strength, and aerobic activities.  If you were told in the past that you have high risk for cardiovascular diseases, you may seek  evaluation by your heart doctor prior to initiating moderate to intense exercise programs.    Handouts Provided Include  LIfestyle medicine handouts Meal Plan Card  Learning Style & Readiness for Change Teaching method utilized: Visual & Auditory  Demonstrated degree of understanding via: Teach Back  Barriers to learning/adherence to lifestyle change: none  Goals Established by Pt Eat three meals per day Don't skip meals Take insulin as prescibed Test blood sugars 4 times per day. Increase fresh fruits and vegetables Drink only water and cut out sodas, juice, tea and junk food.   MONITORING & EVALUATION Dietary intake, weekly physical activity, and blood sugars  in 1 month.  Next Steps  Patient is to work on meal planning and eating three meals per day.Marland Kitchen

## 2022-03-02 ENCOUNTER — Encounter: Payer: Self-pay | Admitting: Nutrition

## 2022-03-02 NOTE — Patient Instructions (Signed)
  Goals Established by Pt Eat three meals per day Don't skip meals Take insulin as prescibed Test blood sugars 4 times per day. Increase fresh fruits and vegetables Drink only water and cut out sodas, juice, tea and junk food.

## 2022-03-05 DIAGNOSIS — E1165 Type 2 diabetes mellitus with hyperglycemia: Secondary | ICD-10-CM | POA: Diagnosis not present

## 2022-03-10 ENCOUNTER — Encounter: Payer: Self-pay | Admitting: Pulmonary Disease

## 2022-03-10 ENCOUNTER — Ambulatory Visit (INDEPENDENT_AMBULATORY_CARE_PROVIDER_SITE_OTHER): Payer: Medicare HMO | Admitting: Pulmonary Disease

## 2022-03-10 ENCOUNTER — Ambulatory Visit (INDEPENDENT_AMBULATORY_CARE_PROVIDER_SITE_OTHER): Payer: Medicare HMO | Admitting: *Deleted

## 2022-03-10 ENCOUNTER — Encounter: Payer: Medicare HMO | Admitting: Nutrition

## 2022-03-10 ENCOUNTER — Encounter: Payer: Self-pay | Admitting: Nutrition

## 2022-03-10 VITALS — Ht 64.0 in | Wt 224.0 lb

## 2022-03-10 DIAGNOSIS — G4733 Obstructive sleep apnea (adult) (pediatric): Secondary | ICD-10-CM | POA: Diagnosis not present

## 2022-03-10 DIAGNOSIS — E782 Mixed hyperlipidemia: Secondary | ICD-10-CM | POA: Diagnosis not present

## 2022-03-10 DIAGNOSIS — E118 Type 2 diabetes mellitus with unspecified complications: Secondary | ICD-10-CM | POA: Diagnosis not present

## 2022-03-10 DIAGNOSIS — I4821 Permanent atrial fibrillation: Secondary | ICD-10-CM

## 2022-03-10 DIAGNOSIS — Z6837 Body mass index (BMI) 37.0-37.9, adult: Secondary | ICD-10-CM | POA: Diagnosis not present

## 2022-03-10 DIAGNOSIS — Z5181 Encounter for therapeutic drug level monitoring: Secondary | ICD-10-CM

## 2022-03-10 DIAGNOSIS — Z9989 Dependence on other enabling machines and devices: Secondary | ICD-10-CM | POA: Diagnosis not present

## 2022-03-10 DIAGNOSIS — Z713 Dietary counseling and surveillance: Secondary | ICD-10-CM | POA: Diagnosis not present

## 2022-03-10 DIAGNOSIS — G4710 Hypersomnia, unspecified: Secondary | ICD-10-CM

## 2022-03-10 DIAGNOSIS — K760 Fatty (change of) liver, not elsewhere classified: Secondary | ICD-10-CM | POA: Diagnosis not present

## 2022-03-10 DIAGNOSIS — I10 Essential (primary) hypertension: Secondary | ICD-10-CM | POA: Diagnosis not present

## 2022-03-10 LAB — POCT INR: INR: 1.1 — AB (ref 2.0–3.0)

## 2022-03-10 NOTE — Patient Instructions (Addendum)
Goals  Get days and nights back on tack. Get back to eating plant based foods after colonoscopy Get online for [email or web address] Drink 80+ oz per day of water Eat 3 meals per day. Exercise as tolerated. Lose  1-2 lbs per week. AM less than 130 and bedtime 10 pm- less than 180 at least. Only take 1/2 of your insulin -Tresiba 30 units at night before your colonoscopy.

## 2022-03-10 NOTE — Patient Instructions (Signed)
Sample schedule :   Bedtime 12 MN Take melatonin 2 h prior - 5-10 mg  Wake up 9 am Light exposure x Provigil 1 h later

## 2022-03-10 NOTE — Assessment & Plan Note (Signed)
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

## 2022-03-10 NOTE — Assessment & Plan Note (Signed)
CPAP download was reviewed which shows reasonable control of events with residual AHI 6.5/hour on average pressure of 13 cm maximum pressure 13.5 on auto settings 8 to 14 cm.  She has a mild leak.  She is very compliant more than 8 hours every night. CPAP is only helped improve her daytime somnolence and fatigue  Weight loss encouraged, compliance with goal of at least 4-6 hrs every night is the expectation. Advised against medications with sedative side effects Cautioned against driving when sleepy - understanding that sleepiness will vary on a day to day basis

## 2022-03-10 NOTE — Patient Instructions (Signed)
Pending colonoscopy on 7/27.  Warfarin has been on hold since 7/21. Restart warfarin night of procedure take warfarin 1 tablet x 2 days then increase dose to 1 tablet daily except 1/2 tablet on Mondays, Wednesdays and Fridays. Recheck in 10 days

## 2022-03-10 NOTE — Progress Notes (Signed)
Medical Nutrition Therapy  Appointment Start time:  1530  Appointment End time:  1630  Dm Typ2  Primary concerns today: DM Type 2 , NASH Referral diagnosis: E11,8, K 76 Preferred learning style: none Learning readiness: Ready    NUTRITION ASSESSMENT  66 yr old female with Type 2 DM, HTN, Hyperlipidemia, NASH  Sees Dr. Fransico Him, Endocrinology. She is working on lifestyle medicine. Getting ready to have a colonoscopy this Thursday. Has been using tofu crumbles instead of b  Glipizide once a day 5 mg; Tresiba Staying up at night and sleeping during the day. Has been skipping her medicines at times due to sleeping schedule  =bring off.  Tresiba 60 units. Glipizide 5 mg in am and Metformin 1000 mg BID. Getting a colonoscopy Thrusday. Will do prep tomorrow. Dr. Alva-pulmonologist. Josephine Igo shows her TIR only 39% and 61% high due to her staying up at night snacking and sleeping during the daytime.  She has been stocking her pantry and fridge with plant based foods. Est A1C is 8%.  Plans on getting back on track after her colonoscopy.  Anthropometrics  Wt Readings from Last 3 Encounters:  03/10/22 221 lb 6.4 oz (100.4 kg)  02/23/22 218 lb (98.9 kg)  01/28/22 218 lb 9.6 oz (99.2 kg)   Ht Readings from Last 3 Encounters:  03/10/22 5\' 4"  (1.626 m)  02/23/22 5\' 4"  (1.626 m)  01/28/22 5\' 4"  (1.626 m)   There is no height or weight on file to calculate BMI. @BMIFA @ Facility age limit for growth %iles is 20 years. Facility age limit for growth %iles is 20 years.    Clinical Medical Hx: See chart Medications: Tresiba, Glipizide, Metformin Labs:  Lab Results  Component Value Date   HGBA1C 11.5 (A) 12/23/2021      Latest Ref Rng & Units 09/25/2021   12:00 AM 06/13/2021   10:30 AM 06/13/2021   10:27 AM  CMP  BUN 4 - 21 12        Creatinine 0.5 - 1.1 0.8        Sodium 135 - 145 mmol/L 135 - 145 mmol/L  137    135  136   Potassium 3.5 - 5.1 mmol/L 3.5 - 5.1 mmol/L  4.3    4.3   4.3   Calcium 8.7 - 10.7 9.8        AST 13 - 35 42        ALT 7 - 35 U/L 29           This result is from an external source.   Multiple values from one day are sorted in reverse-chronological order   Lipid Panel     Component Value Date/Time   CHOL 148 09/25/2021 0000   TRIG 421 (A) 09/25/2021 0000   HDL 29 (A) 09/25/2021 0000   LDLCALC 55 09/25/2021 0000    Notable Signs/Symptoms: Increased thirsty, frequent urination.  Lifestyle & Dietary Hx Married . Hsuband dows cooking and shopping. Gouts out to eat.  Estimated daily fluid intake: 30 oz Supplements: see chart Sleep: varies Stress / self-care: family  Current average weekly physical activity: none  24-Hr Dietary Recall First Meal: ham biscuit, or bacon egg and cheese biscuit and gravy Snack:  Second Meal: leftovers or pb sandwich, or banana, water Snack:  Third Meal: Chicken potpie from [facility], water Snack:  Beverages: water  Estimated Energy Needs Calories: 1200 Carbohydrate: 135g Protein: 90g Fat: 33g   NUTRITION DIAGNOSIS  NB-1.1 Food and nutrition-related knowledge deficit  As related to Diabetes Type 2.  As evidenced by A1C 11.5%.   NUTRITION INTERVENTION  Nutrition education (E-1) on the following topics:  Nutrition and Diabetes education provided on My Plate, CHO counting, meal planning, portion sizes, timing of meals, avoiding snacks between meals unless having a low blood sugar, target ranges for A1C and blood sugars, signs/symptoms and treatment of hyper/hypoglycemia, monitoring blood sugars, taking medications as prescribed, benefits of exercising 30 minutes per day and prevention of complications of DM.  Lifestyle Medicine  - Whole Food, Plant Predominant Nutrition is highly recommended: Eat Plenty of vegetables, Mushrooms, fruits, Legumes, Whole Grains, Nuts, seeds in lieu of processed meats, processed snacks/pastries red meat, poultry, eggs.    -It is better to avoid simple carbohydrates  including: Cakes, Sweet Desserts, Ice Cream, Soda (diet and regular), Sweet Tea, Candies, Chips, Cookies, Store Bought Juices, Alcohol in Excess of  1-2 drinks a day, Lemonade,  Artificial Sweeteners, Doughnuts, Coffee Creamers, "Sugar-free" Products, etc, etc.  This is not a complete list.....  Exercise: If you are able: 30 -60 minutes a day ,4 days a week, or 150 minutes a week.  The longer the better.  Combine stretch, strength, and aerobic activities.  If you were told in the past that you have high risk for cardiovascular diseases, you may seek evaluation by your heart doctor prior to initiating moderate to intense exercise programs.    Handouts Provided Include  LIfestyle medicine handouts Meal Plan Card  Learning Style & Readiness for Change Teaching method utilized: Visual & Auditory  Demonstrated degree of understanding via: Teach Back  Barriers to learning/adherence to lifestyle change: none  Goals Established by Pt Goals  Get days and nights back on tack. Get back to eating plant based foods after colonoscopy Get online for [email or web address] Drink 80+ oz per day of water Eat 3 meals per day. Exercise as tolerated. Lose  1-2 lbs per week. AM less than 130 and bedtime 10 pm- less than 180 at least. Only take 1/2 of your insulin -Tresiba 30 units at night before your colonoscopy.  MONITORING & EVALUATION Dietary intake, weekly physical activity, and blood sugars  in 1 month.  Next Steps  Patient is to work on meal planning and eating three meals per day.Marland Kitchen

## 2022-03-10 NOTE — Progress Notes (Signed)
   Subjective:    Patient ID: Rachel Odonnell, female    DOB: [birth year], 66 y.o.   MRN: [id number]  HPI  66 yo ex-smoker with mild emphysema ,OSA and persistent hypersomnolence - delayed sleep phase - worked night shift x 16 y She reports a diagnosis of narcolepsy many years ago when she was living in [place].  -RLS on requip     PMH -ADD - was on Adderall. -Atrial fibrillation   Chief Complaint  Patient presents with   Follow-up    Patient states CPAP working well.    On her last visit, we changed her to auto CPAP 8 to 14 cm & added provigil. She saw neurology for persistent tremors, imaging was negative .Requip was changed to gabapentin   She has not tried the Provigil much.  She admits that she does not have a fixed bedtime or fixed wake-up time.  Sometimes she sleeps all day.  She has worked the night shift for so many years that she has found it difficult to adjust to a normal sleep cycle  CPAP is working well and she denies any problems with mask or pressure.  She is only taking 1 dose of gabapentin at bedtime due to her sleepiness.  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 Also denies presyncope, palpitations, heartburn, abdominal pain, nausea, vomiting, diarrhea or change in bowel or urinary habits, dysuria,hematuria, rash, arthralgias, visual  complaints, headache, numbness weakness or ataxia.     Objective:   Physical Exam   Gen. Pleasant, obese, in no distress ENT - no lesions, no post nasal drip Neck: No JVD, no thyromegaly, no carotid bruits Lungs: no use of accessory muscles, no dullness to percussion, decreased without rales or rhonchi  Cardiovascular: Rhythm regular, heart sounds  normal, no murmurs or gallops, no peripheral edema Musculoskeletal: No deformities, no cyanosis or clubbing , no tremors        Assessment & Plan:

## 2022-03-12 DIAGNOSIS — K649 Unspecified hemorrhoids: Secondary | ICD-10-CM | POA: Diagnosis not present

## 2022-03-12 DIAGNOSIS — Z1211 Encounter for screening for malignant neoplasm of colon: Secondary | ICD-10-CM | POA: Diagnosis not present

## 2022-03-12 DIAGNOSIS — J439 Emphysema, unspecified: Secondary | ICD-10-CM | POA: Diagnosis not present

## 2022-03-12 DIAGNOSIS — E119 Type 2 diabetes mellitus without complications: Secondary | ICD-10-CM | POA: Diagnosis not present

## 2022-03-12 DIAGNOSIS — E1165 Type 2 diabetes mellitus with hyperglycemia: Secondary | ICD-10-CM | POA: Diagnosis not present

## 2022-03-12 DIAGNOSIS — Z86010 Personal history of colonic polyps: Secondary | ICD-10-CM | POA: Diagnosis not present

## 2022-03-12 DIAGNOSIS — K5730 Diverticulosis of large intestine without perforation or abscess without bleeding: Secondary | ICD-10-CM | POA: Diagnosis not present

## 2022-03-12 DIAGNOSIS — K641 Second degree hemorrhoids: Secondary | ICD-10-CM | POA: Diagnosis not present

## 2022-03-12 DIAGNOSIS — Z87891 Personal history of nicotine dependence: Secondary | ICD-10-CM | POA: Diagnosis not present

## 2022-03-12 DIAGNOSIS — Q438 Other specified congenital malformations of intestine: Secondary | ICD-10-CM | POA: Diagnosis not present

## 2022-03-12 DIAGNOSIS — K621 Rectal polyp: Secondary | ICD-10-CM | POA: Diagnosis not present

## 2022-03-12 DIAGNOSIS — E785 Hyperlipidemia, unspecified: Secondary | ICD-10-CM | POA: Diagnosis not present

## 2022-03-17 DIAGNOSIS — E039 Hypothyroidism, unspecified: Secondary | ICD-10-CM | POA: Diagnosis not present

## 2022-03-17 DIAGNOSIS — E1165 Type 2 diabetes mellitus with hyperglycemia: Secondary | ICD-10-CM | POA: Diagnosis not present

## 2022-03-17 DIAGNOSIS — E785 Hyperlipidemia, unspecified: Secondary | ICD-10-CM | POA: Diagnosis not present

## 2022-03-17 DIAGNOSIS — K219 Gastro-esophageal reflux disease without esophagitis: Secondary | ICD-10-CM | POA: Diagnosis not present

## 2022-03-18 ENCOUNTER — Telehealth: Payer: Self-pay | Admitting: "Endocrinology

## 2022-03-18 DIAGNOSIS — Z6837 Body mass index (BMI) 37.0-37.9, adult: Secondary | ICD-10-CM | POA: Diagnosis not present

## 2022-03-18 DIAGNOSIS — R87622 Low grade squamous intraepithelial lesion on cytologic smear of vagina (LGSIL): Secondary | ICD-10-CM | POA: Diagnosis not present

## 2022-03-18 NOTE — Telephone Encounter (Signed)
Pt is asking if she is on auto ship with her [employer]. Please Advise

## 2022-03-18 NOTE — Telephone Encounter (Signed)
Spoke with pt, made her aware [employer] should automatically ship her sensors to her. Understanding voiced.

## 2022-03-23 ENCOUNTER — Ambulatory Visit: Payer: Medicare HMO | Admitting: Adult Health

## 2022-03-23 ENCOUNTER — Encounter: Payer: Self-pay | Admitting: Adult Health

## 2022-03-23 ENCOUNTER — Ambulatory Visit (INDEPENDENT_AMBULATORY_CARE_PROVIDER_SITE_OTHER): Payer: Medicare HMO | Admitting: Adult Health

## 2022-03-23 VITALS — BP 158/62 | HR 57 | Ht 64.0 in | Wt 218.0 lb

## 2022-03-23 DIAGNOSIS — R413 Other amnesia: Secondary | ICD-10-CM

## 2022-03-23 DIAGNOSIS — G2581 Restless legs syndrome: Secondary | ICD-10-CM

## 2022-03-23 DIAGNOSIS — G4710 Hypersomnia, unspecified: Secondary | ICD-10-CM | POA: Diagnosis not present

## 2022-03-23 DIAGNOSIS — R251 Tremor, unspecified: Secondary | ICD-10-CM | POA: Diagnosis not present

## 2022-03-23 NOTE — Progress Notes (Signed)
[facility] [address] [place]. [place] [address] [phone]       OFFICE FOLLOW UP NOTE  Rachel Odonnell Date of Birth:  11-01-1955 Medical Record Number:  [id number]    Primary neurologist: Dr. Terrace Arabia Reason for visit: Tremor and excessive daytime fatigue    SUBJECTIVE:   CHIEF COMPLAINT:  Chief Complaint  Patient presents with   Follow-up    RM 3 alone  Pt is well, states tremors is the same and fatigue has slightly improved since last visit      HPI:   Rachel Odonnell is a 66 year old female, seen in request by her primary care PA Wayland Denis for evaluation of bilateral hands tremor, excessive daytime sleepiness, fatigue, initial evaluation was on November 17, 2021   I reviewed and summarized the referring note. PMHX. DM HTN CAD Atrial fibrillation, OSA-CPAP   She denies family history of tremor, around September 2022 she noticed left hand action tremor, shaky when she holds up her left hand, recent involvement of right hand,  She had long history of obesity, obstructive sleep apnea, using CPAP machine, complains of increased sleepiness since 2022, drifting to sleep during daytime, sedation,  She had repeat sleep study in March 2023 by Dr. Virgel Manifold, reported obstructive sleep apnea corrected by CPAP, idiopathic hypersomnia, sleep latency was 2 minutes 50 seconds on any sleep latency test  She also concerned about memory loss, she used to work at jail, complains of nightmares, now has improved after retirement   Echocardiogram in December 2022: Ejection fraction 60 to 65%, no significant valvular pathology  Cardiac catheter in October 2022, mild to moderate nonobstructive coronary artery disease,   Laboratory evaluations in Oct 2022, A1c was 8.9, CMP, glucose 364, creat 0.75,  AST 59, ALT 43, hg 14.8, TSH,  Triglyceride 395,     Update 03/23/2022 JM: Patient is being seen for follow-up visit after initial consult visit with Dr. Terrace Arabia 4 months  ago.  She is unaccompanied today.  Reports continued tremors, essentially unchanged since prior visit. Still has issues when gripping steering wheel and holding menu for prolonged period of time.  Can occasionally interfere with writing, does not interfere with ADLs.  Discontinued Requip at prior visit as this can contribute to excessive sleepiness and recommended use of gabapentin 100 mg 3 times daily. Has been taking 100mg  nightly, no significant issues with restless leg.  Will noticed it being worse when she forgets a dose   Continues to follow with pulmonology for OSA on CPAP and hypersomnolence, recommended use of Provigil for daytime sleepiness and melatonin for delayed sleep phase syndrome. Does report Provigil has helped a lot with sleepiness.  Has been taking melatonin 15 mg nightly and working on sleep/wake cycle.  She continues to have some issues with cognition more with short-term memory loss.  She continues to drive and maintains all ADLs and IADLs independently.  She does report family history of Alzheimer's including her aunt on her mother side and paternal grandmother. Completed MRI of the brain back in April showed mild generalized cortical atrophy more pronounced in medial temporal lobes as well as mild chronic microvascular ischemic change.      ROS:   14 system review of systems performed and negative with exception of those listed in HPI  PMH:  Past Medical History:  Diagnosis Date   ADD (attention deficit disorder)    Diabetes mellitus without complication (HCC)    Hypertension     PSH:  Past  Surgical History:  Procedure Laterality Date   ABDOMINAL HYSTERECTOMY     EXCISIONAL HEMORRHOIDECTOMY     INCONTINENCE SURGERY     RIGHT/LEFT HEART CATH AND CORONARY ANGIOGRAPHY N/A 06/13/2021   Procedure: RIGHT/LEFT HEART CATH AND CORONARY ANGIOGRAPHY;  Surgeon: [place], Peter M, MD;  Location: [facility] INVASIVE CV LAB;  Service: Cardiovascular;  Laterality: N/A;    Social  History:  Social History   Socioeconomic History   Marital status: Married    Spouse name: Rachel Odonnell   Number of children: Not on file   Years of education: Not on file   Highest education level: Not on file  Occupational History   Not on file  Tobacco Use   Smoking status: Former    Packs/day: 2.00    Types: Cigarettes    Quit date: 08/17/1994    Years since quitting: 27.6   Smokeless tobacco: Never  Vaping Use   Vaping Use: Never used  Substance and Sexual Activity   Alcohol use: No   Drug use: No   Sexual activity: Not on file  Other Topics Concern   Not on file  Social History Narrative   Lives at home with husband, Rachel Odonnell   Former smoker Smoked 2 ppd x 25 yrs; Quit in 1996   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, retail  retired Secondary school teacher, still works part-time at the [employer], used to work Editor, commissioning   Now on fixed income from her SSI and pension   Social Determinants of Health   Financial Resource Strain: Low Risk  (10/10/2021)   Overall Financial Resource Strain (CARDIA)    Difficulty of Paying Living Expenses: Not hard at all  Food Insecurity: No Food Insecurity (10/10/2021)   Hunger Vital Sign    Worried About Running Out of Food in the Last Year: Never true    Ran Out of Food in the Last Year: Never true  Transportation Needs: No Transportation Needs (10/10/2021)   PRAPARE - Administrator, Civil Service (Medical): No    Lack of Transportation (Non-Medical): No  Physical Activity: Unknown (04/18/2021)   Exercise Vital Sign    Days of Exercise per Week: 1 day    Minutes of Exercise per Session: Not on file  Stress: No Stress Concern Present (10/10/2021)   [employer] of Occupational Health - Occupational Stress Questionnaire    Feeling of Stress : Only a little  Social Connections: Socially Integrated (10/10/2021)   Social Connection and Isolation Panel [NHANES]    Frequency of Communication with Friends and  Family: More than three times a week    Frequency of Social Gatherings with Friends and Family: More than three times a week    Attends Religious Services: More than 4 times per year    Active Member of [employer] or Organizations: Yes    Attends Engineer, structural: More than 4 times per year    Marital Status: Married  Catering manager Violence: Not At Risk (10/10/2021)   Humiliation, Afraid, Rape, and Kick questionnaire    Fear of Current or Ex-Partner: No    Emotionally Abused: No    Physically Abused: No    Sexually Abused: No    Family History:  Family History  Problem Relation Age of Onset   Cancer Mother    Diabetes Father    Heart Problems Father    COPD Father    Emphysema Father    Heart block Father    Congestive  Heart Failure Father     Medications:   Current Outpatient Medications on File Prior to Visit  Medication Sig Dispense Refill   busPIRone (BUSPAR) 5 MG tablet Take 5 mg by mouth 3 (three) times daily.     Cholecalciferol (VITAMIN D3) 125 MCG (5000 UT) CAPS Take 1 capsule (5,000 Units total) by mouth daily. 90 capsule 0   citalopram (CELEXA) 40 MG tablet Take 40 mg by mouth every evening.     Continuous Blood Gluc Receiver (FREESTYLE LIBRE 2 READER) DEVI As directed 1 each 0   Continuous Blood Gluc Sensor (FREESTYLE LIBRE 2 SENSOR) MISC 1 Piece by Does not apply route every 14 (fourteen) days. 2 each 3   estradiol (ESTRACE) 2 MG tablet Take 2 mg by mouth every evening.     EUTHYROX 25 MCG tablet Take 25 mcg by mouth daily before breakfast.     fenofibrate (TRICOR) 145 MG tablet Take 145 mg by mouth every evening.     fluorometholone (FML) 0.1 % ophthalmic suspension Place 1 drop into both eyes 3 (three) times daily as needed (allergy eyes.).     gabapentin (NEURONTIN) 100 MG capsule Take 1 capsule (100 mg total) by mouth 3 (three) times daily. (Patient taking differently: Take 100 mg by mouth at bedtime.) 90 capsule 11   glipiZIDE (GLUCOTROL) 5 MG tablet  Take 1 tablet (5 mg total) by mouth daily before breakfast. 90 tablet 1   insulin degludec (TRESIBA FLEXTOUCH) 200 UNIT/ML FlexTouch Pen Inject 60 Units into the skin at bedtime. 12 mL 2   Insulin Pen Needle (B-D ULTRAFINE III SHORT PEN) 31G X 8 MM MISC 1 each by Does not apply route as directed. 100 each 2   isosorbide mononitrate (IMDUR) 60 MG 24 hr tablet Take 1 tablet (60 mg total) by mouth daily. 90 tablet 3   levocetirizine (XYZAL) 5 MG tablet Take 5 mg by mouth every evening.     metFORMIN (GLUCOPHAGE) 500 MG tablet Take 2 tablets (1,000 mg total) by mouth 2 (two) times daily with a meal.     modafinil (PROVIGIL) 100 MG tablet Take 1 tablet (100 mg total) by mouth daily. 30 tablet 0   montelukast (SINGULAIR) 10 MG tablet Take 10 mg by mouth at bedtime.     Multiple Vitamin (MULTIVITAMIN ADULT PO) Take 1 tablet by mouth daily.     Omega-3 Fatty Acids (FISH OIL) 1000 MG CAPS Take 1,000 mg by mouth 2 (two) times daily. Morning and evening     omeprazole (PRILOSEC) 40 MG capsule Take 40 mg by mouth every evening.     rosuvastatin (CRESTOR) 10 MG tablet Take 10 mg by mouth every evening.     warfarin (COUMADIN) 5 MG tablet Take 1/2 a tablet to 1 tablet by mouth daily as directed by the coumadin clinic. 30 tablet 1   No current facility-administered medications on file prior to visit.    Allergies:   Allergies  Allergen Reactions   Azithromycin Hives   Codeine Nausea Only   Atomoxetine Nausea And Vomiting   Atorvastatin Other (See Comments)    Restless legs   Lisinopril Nausea And Vomiting      OBJECTIVE:  Physical Exam  Vitals:   03/23/22 1515  BP: (!) 158/62  Pulse: (!) 57  Weight: 218 lb (98.9 kg)  Height: 5\' 4"  (1.626 m)   Body mass index is 37.42 kg/m. No results found.   General: well developed, well nourished, pleasant middle-age Caucasian female, seated, in  no evident distress Head: head normocephalic and atraumatic.   Musculoskeletal: no deformity Skin:  no  rash/petichiae Vascular:  Normal pulses all extremities   Neurologic Exam Mental Status: Awake and fully alert.  Fluent speech and language.  Oriented to place and time. Recent memory subjectively impaired and remote memory intact. Attention span, concentration and fund of knowledge appropriate during visit. Mood and affect flat.     11/17/2021    3:18 PM  Montreal Cognitive Assessment   Visuospatial/ Executive (0/5) 4  Naming (0/3) 3  Attention: Read list of digits (0/2) 1  Attention: Read list of letters (0/1) 1  Attention: Serial 7 subtraction starting at 100 (0/3) 3  Language: Repeat phrase (0/2) 2  Language : Fluency (0/1) 0  Abstraction (0/2) 1  Delayed Recall (0/5) 1  Orientation (0/6) 5  Total 21  Adjusted Score (based on education) 22   Cranial Nerves: Pupils equal, briskly reactive to light. Extraocular movements full without nystagmus. Visual fields full to confrontation. Hearing intact. Facial sensation intact. Face, tongue, palate moves normally and symmetrically.  Motor: Normal bulk and tone. Normal strength in all tested extremity muscles.  Unable to appreciate any action or resting tremor bilaterally.  No evidence of cogwheel rigidity or bradykinesia Sensory.: intact to touch , pinprick , position and vibratory sensation.  Coordination: Rapid alternating movements normal in all extremities. Finger-to-nose and heel-to-shin performed accurately bilaterally. Gait and Station: Arises from chair without difficulty. Stance is normal. Gait demonstrates wide-based gait with mild unsteadiness initially with chronic low back pain, ambulates without assistive device.  Tandem walk and heel toe not attempted Reflexes: 1+ and symmetric. Toes downgoing.         ASSESSMENT/PLAN: Rachel Odonnell is a 66 y.o. year old female    Tremor  Unable to appreciate tremor on exam today - mild action tremor involving both hands worse with holding steering well and holding menu at  restaurant  No parkinsonian features, per Dr. 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  Would not recommend use of medication management as current symptoms are mild and does not greatly interfere with daily activities/functioning, concern potential side effects may outweigh benefit  Discussed possibly participating in PT but declines interest at this time as she currently has multiple doctors appointments  Advised to follow-up with PCP to discuss current use of Celexa as this could potentially be contributing  Continue to follow with PCP for hypothyroidism   Cognitive impairment             MoCA examination 22/30 (11/2021)              MRI of brain mild generalized cortical atrophy  Does have family history of Alzheimer's - could consider neuro cognitive evaluation in the future if symptoms should worsen.   Discussed underlying excessive fatigue could be contributing as well as multiple comorbidities             laboratory evaluation from primary care physician   Excessive daytime sleepiness, fatigue, OSA on CPAP  Currently managed by pulmonology on Provigil and melatonin with some improvement Continue nightly CPAP for OSA management     Restless leg symptoms            Well-controlled on gabapentin 100 mg nightly - can continue to be managed by PCP/pulmonology       No further recommendations or workup indicated of the above from a neurological standpoint.  Recommend continued follow-up with other multiple specialties and call office with any questions  or concerns in the future   CC:  PCP: Royann Shivers, PA-C    I spent 26 minutes of face-to-face and non-face-to-face time with patient.  This included previsit chart review, lab review, study review, electronic health record documentation, patient education and discussion regarding above diagnoses and answered all other questions to patient satisfaction   Ihor Austin, [facility]  [facility] [address] [place], [place] [address]  Phone [phone] Fax [phone] Note: This document was prepared with digital dictation and possible smart phrase technology. Any transcriptional errors that result from this process are unintentional.

## 2022-03-23 NOTE — Patient Instructions (Addendum)
Your Plan:  Continue working with pulmonology for sleep apnea and continue day time fatigue   You may start to see some improvement of your tremor and memory issues as your fatigue improves  Please let me know if you would like to do some physical therapy for tremors  Please let me know if you would ike to do complete a neurocognitive evaluation by a neuropsychologist for memory concerns        Thank you for coming to see [place] at [facility]. I hope we have been able to provide you high quality care today.  You may receive a patient satisfaction survey over the next few weeks. We would appreciate your feedback and comments so that we may continue to improve ourselves and the health of our patients.    Tremor A tremor is trembling or shaking that you cannot control. Most tremors affect the hands or arms. Tremors can also affect the head, vocal cords, face, and other parts of the body. There are many types of tremors. Common types include: Essential tremor. These usually occur in people older than 40. This type of tremor may run in families and can happen in otherwise healthy people. Resting tremor. These occur when the muscles are at rest, such as when your hands are resting in your lap. People with Parkinson's disease often have resting tremors. Postural tremor. These occur when you try to hold a pose, such as keeping your hands outstretched. Kinetic tremor. These occur during purposeful movement, such as trying to touch a finger to your nose. Task-specific tremor. These may occur when you do certain tasks such as writing, speaking, or standing. Psychogenic tremor. These are greatly reduced or go away when you are distracted. These tremors happen due to underlying stress or psychiatric disease. They can happen in people of all ages. Some types of tremors have no known cause. Tremors can also be a symptom of nervous system problems (neurological disorders) that may occur  with aging. Some tremors go away with treatment, while others do not. Follow these instructions at home: Lifestyle     If you drink alcohol: Limit how much you have to: 0-1 drink a day for women who are not pregnant. 0-2 drinks a day for men. Know how much alcohol is in a drink. In the [place] one drink equals one 12 oz bottle of beer (355 mL), one 5 oz glass of wine (148 mL), or one 1 oz glass of hard liquor (44 mL). Do not use any products that contain nicotine or tobacco. These products include cigarettes, chewing tobacco, and vaping devices, such as e-cigarettes. If you need help quitting, ask your health care provider. Avoid extreme heat and extreme cold. Limit your caffeine intake, as told by your health care provider. Try to get 8 hours of sleep each night. Find ways to manage your stress, such as meditation or yoga. General instructions Take over-the-counter and prescription medicines only as told by your health care provider. Keep all follow-up visits. This is important. Contact a health care provider if: You develop a tremor after starting a new medicine. You have a tremor along with other symptoms such as: Numbness. Tingling. Pain. Weakness. Your tremor gets worse. Your tremor interferes with your day-to-day life. Summary A tremor is trembling or shaking that you cannot control. Most tremors affect the hands or arms. Some types of tremors have no known cause. Others may be a symptom of nervous system problems (neurological disorders). Make sure you discuss any  tremors you have with your health care provider. This information is not intended to replace advice given to you by your health care provider. Make sure you discuss any questions you have with your health care provider. Document Revised: 05/23/2021 Document Reviewed: 05/23/2021 Elsevier Patient Education  2023 Elsevier Inc.     Compensation Strategies for Tremors  When eating, try the following Eat out of  bowls, divided plates, or use a plate guard (available at a medical supply store) and eat with a spoon so that you have an edge to scoop up food. Try raising your plate so that there is less distance between the plate and mouth.Try stabilizing elbows on the tables or against your body. Use utensil with built-up/larger grips as they are easier to hold.  When writing, try the following: Stabilize forearm on the table. Take your time as rushing/being stressed can increase tremors. Try a felt-tipped pen, it does not glide as much.  Avoid gel pens ( they move to much ). Consider using pre-printed labels with your name and address (carry them with you when you go out) or you can get stamps with your address or signature on it. Use a small tape recorder to record messages/reminders for yourself. Use pens with bigger grips.  When brushing your teeth, putting on make-up, or styling hair, try the following: Use an electric toothbrush. Use items with built-up grips. Stabilize your elbows against your body or on the counter. Use long-handled brushes/combs. Use a hair dryer with a stand.  In general: Avoid stress, fatigue or rushing as this can increase tremors. Sit down for activities that require more control/coordination. Perform "flicks".

## 2022-03-24 ENCOUNTER — Ambulatory Visit (INDEPENDENT_AMBULATORY_CARE_PROVIDER_SITE_OTHER): Payer: Medicare HMO | Admitting: *Deleted

## 2022-03-24 DIAGNOSIS — I4821 Permanent atrial fibrillation: Secondary | ICD-10-CM

## 2022-03-24 DIAGNOSIS — Z5181 Encounter for therapeutic drug level monitoring: Secondary | ICD-10-CM | POA: Diagnosis not present

## 2022-03-24 LAB — POCT INR: INR: 1.3 — AB (ref 2.0–3.0)

## 2022-03-24 NOTE — Patient Instructions (Signed)
Increase warfarin to 1 tablet daily except 1/2 tablet on Sundays Recheck in 1 wk 

## 2022-03-25 DIAGNOSIS — F339 Major depressive disorder, recurrent, unspecified: Secondary | ICD-10-CM | POA: Diagnosis not present

## 2022-03-25 DIAGNOSIS — F419 Anxiety disorder, unspecified: Secondary | ICD-10-CM | POA: Diagnosis not present

## 2022-03-31 ENCOUNTER — Ambulatory Visit (INDEPENDENT_AMBULATORY_CARE_PROVIDER_SITE_OTHER): Payer: Medicare HMO | Admitting: *Deleted

## 2022-03-31 DIAGNOSIS — Z131 Encounter for screening for diabetes mellitus: Secondary | ICD-10-CM | POA: Diagnosis not present

## 2022-03-31 DIAGNOSIS — K219 Gastro-esophageal reflux disease without esophagitis: Secondary | ICD-10-CM | POA: Diagnosis not present

## 2022-03-31 DIAGNOSIS — E1165 Type 2 diabetes mellitus with hyperglycemia: Secondary | ICD-10-CM | POA: Diagnosis not present

## 2022-03-31 DIAGNOSIS — Z13220 Encounter for screening for lipoid disorders: Secondary | ICD-10-CM | POA: Diagnosis not present

## 2022-03-31 DIAGNOSIS — Z5181 Encounter for therapeutic drug level monitoring: Secondary | ICD-10-CM

## 2022-03-31 DIAGNOSIS — E785 Hyperlipidemia, unspecified: Secondary | ICD-10-CM | POA: Diagnosis not present

## 2022-03-31 DIAGNOSIS — I4821 Permanent atrial fibrillation: Secondary | ICD-10-CM

## 2022-03-31 DIAGNOSIS — Z1329 Encounter for screening for other suspected endocrine disorder: Secondary | ICD-10-CM | POA: Diagnosis not present

## 2022-03-31 DIAGNOSIS — E039 Hypothyroidism, unspecified: Secondary | ICD-10-CM | POA: Diagnosis not present

## 2022-03-31 LAB — POCT INR: INR: 2.6 (ref 2.0–3.0)

## 2022-03-31 NOTE — Patient Instructions (Signed)
Continue 1 tablet daily except 1/2 tablet on Sundays Recheck in 1 wk

## 2022-04-01 DIAGNOSIS — M9901 Segmental and somatic dysfunction of cervical region: Secondary | ICD-10-CM | POA: Diagnosis not present

## 2022-04-01 DIAGNOSIS — M9902 Segmental and somatic dysfunction of thoracic region: Secondary | ICD-10-CM | POA: Diagnosis not present

## 2022-04-01 DIAGNOSIS — S338XXA Sprain of other parts of lumbar spine and pelvis, initial encounter: Secondary | ICD-10-CM | POA: Diagnosis not present

## 2022-04-01 DIAGNOSIS — S233XXA Sprain of ligaments of thoracic spine, initial encounter: Secondary | ICD-10-CM | POA: Diagnosis not present

## 2022-04-01 DIAGNOSIS — S134XXA Sprain of ligaments of cervical spine, initial encounter: Secondary | ICD-10-CM | POA: Diagnosis not present

## 2022-04-01 DIAGNOSIS — M9903 Segmental and somatic dysfunction of lumbar region: Secondary | ICD-10-CM | POA: Diagnosis not present

## 2022-04-01 LAB — TSH: TSH: 7.99 — AB (ref 0.41–5.90)

## 2022-04-02 ENCOUNTER — Other Ambulatory Visit: Payer: Self-pay | Admitting: Cardiology

## 2022-04-02 ENCOUNTER — Ambulatory Visit: Payer: Medicare HMO | Admitting: Nutrition

## 2022-04-02 ENCOUNTER — Telehealth: Payer: Self-pay | Admitting: "Endocrinology

## 2022-04-02 ENCOUNTER — Encounter: Payer: Self-pay | Admitting: "Endocrinology

## 2022-04-02 ENCOUNTER — Ambulatory Visit (INDEPENDENT_AMBULATORY_CARE_PROVIDER_SITE_OTHER): Payer: Medicare HMO | Admitting: "Endocrinology

## 2022-04-02 VITALS — BP 108/56 | HR 68 | Ht 64.0 in | Wt 223.6 lb

## 2022-04-02 DIAGNOSIS — I4891 Unspecified atrial fibrillation: Secondary | ICD-10-CM

## 2022-04-02 DIAGNOSIS — E782 Mixed hyperlipidemia: Secondary | ICD-10-CM | POA: Diagnosis not present

## 2022-04-02 DIAGNOSIS — K760 Fatty (change of) liver, not elsewhere classified: Secondary | ICD-10-CM

## 2022-04-02 DIAGNOSIS — E039 Hypothyroidism, unspecified: Secondary | ICD-10-CM

## 2022-04-02 DIAGNOSIS — I10 Essential (primary) hypertension: Secondary | ICD-10-CM

## 2022-04-02 DIAGNOSIS — E559 Vitamin D deficiency, unspecified: Secondary | ICD-10-CM | POA: Diagnosis not present

## 2022-04-02 DIAGNOSIS — E1159 Type 2 diabetes mellitus with other circulatory complications: Secondary | ICD-10-CM

## 2022-04-02 LAB — POCT GLYCOSYLATED HEMOGLOBIN (HGB A1C): HbA1c, POC (controlled diabetic range): 9 % — AB (ref 0.0–7.0)

## 2022-04-02 LAB — POCT UA - MICROALBUMIN
Albumin/Creatinine Ratio, Urine, POC: <30
Creatinine, POC: 200 mg/dL
Microalbumin Ur, POC: 10 mg/L

## 2022-04-02 MED ORDER — TRESIBA FLEXTOUCH 200 UNIT/ML ~~LOC~~ SOPN
70.0000 [IU] | PEN_INJECTOR | Freq: Every day | SUBCUTANEOUS | 2 refills | Status: DC
Start: 2022-04-02 — End: 2022-05-12

## 2022-04-02 MED ORDER — EUTHYROX 50 MCG PO TABS: 50.0000 ug | ORAL_TABLET | Freq: Every day | ORAL | 1 refills | Status: DC

## 2022-04-02 NOTE — Telephone Encounter (Signed)
Called pt, reviewed her A1c result.

## 2022-04-02 NOTE — Progress Notes (Signed)
04/02/2022, 7:16 PM   Endocrinology follow-up note  Subjective:    Patient ID: Rachel Odonnell, female    DOB: 1956-05-12.  Rachel Odonnell is being seen in follow-up after she was seen in consultation for  management of currently uncontrolled symptomatic diabetes requested by  Royann Shivers, PA-C.   Past Medical History:  Diagnosis Date   ADD (attention deficit disorder)    Diabetes mellitus without complication (HCC)    Hypertension     Past Surgical History:  Procedure Laterality Date   ABDOMINAL HYSTERECTOMY     COLONOSCOPY  02/2022   EXCISIONAL HEMORRHOIDECTOMY     INCONTINENCE SURGERY     RIGHT/LEFT HEART CATH AND CORONARY ANGIOGRAPHY N/A 06/13/2021   Procedure: RIGHT/LEFT HEART CATH AND CORONARY ANGIOGRAPHY;  Surgeon: [place], Peter M, MD;  Location: [facility] INVASIVE CV LAB;  Service: Cardiovascular;  Laterality: N/A;    Social History   Socioeconomic History   Marital status: Married    Spouse name: Shon Hale   Number of children: Not on file   Years of education: Not on file   Highest education level: Not on file  Occupational History   Not on file  Tobacco Use   Smoking status: Former    Packs/day: 2.00    Types: Cigarettes    Quit date: 08/17/1994    Years since quitting: 27.6   Smokeless tobacco: Never  Vaping Use   Vaping Use: Never used  Substance and Sexual Activity   Alcohol use: No   Drug use: No   Sexual activity: Not on file  Other Topics Concern   Not on file  Social History Narrative   Lives at home with husband, Shon Hale   Former smoker Smoked 2 ppd x 25 yrs; Quit in 1996   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, retail  retired Secondary school teacher, still works part-time at the [employer], used to work Editor, commissioning   Now on fixed income from her SSI and pension   Social Determinants of Health   Financial Resource Strain: Low Risk   (10/10/2021)   Overall Financial Resource Strain (CARDIA)    Difficulty of Paying Living Expenses: Not hard at all  Food Insecurity: No Food Insecurity (10/10/2021)   Hunger Vital Sign    Worried About Running Out of Food in the Last Year: Never true    Ran Out of Food in the Last Year: Never true  Transportation Needs: No Transportation Needs (10/10/2021)   PRAPARE - Administrator, Civil Service (Medical): No    Lack of Transportation (Non-Medical): No  Physical Activity: Unknown (04/18/2021)   Exercise Vital Sign    Days of Exercise per Week: 1 day    Minutes of Exercise per Session: Not on file  Stress: No Stress Concern Present (10/10/2021)   [employer] of Occupational Health - Occupational Stress Questionnaire    Feeling of Stress : Only a little  Social Connections: Socially Integrated (10/10/2021)   Social Connection and Isolation Panel [NHANES]    Frequency of Communication with Friends and Family: More than three times a week  Frequency of Social Gatherings with Friends and Family: More than three times a week    Attends Religious Services: More than 4 times per year    Active Member of [employer] or Organizations: Yes    Attends Music therapist: More than 4 times per year    Marital Status: Married    Family History  Problem Relation Age of Onset   Cancer Mother    Diabetes Father    Heart Problems Father    COPD Father    Emphysema Father    Heart block Father    Congestive Heart Failure Father     Outpatient Encounter Medications as of 04/02/2022  Medication Sig   EUTHYROX 50 MCG tablet Take 1 tablet (50 mcg total) by mouth daily before breakfast.   busPIRone (BUSPAR) 5 MG tablet Take 5 mg by mouth 3 (three) times daily.   Cholecalciferol (VITAMIN D3) 125 MCG (5000 UT) CAPS Take 1 capsule (5,000 Units total) by mouth daily.   citalopram (CELEXA) 40 MG tablet Take 40 mg by mouth every evening.   Continuous Blood Gluc Receiver (FREESTYLE  LIBRE 2 READER) DEVI As directed   Continuous Blood Gluc Sensor (FREESTYLE LIBRE 2 SENSOR) MISC 1 Piece by Does not apply route every 14 (fourteen) days.   estradiol (ESTRACE) 2 MG tablet Take 2 mg by mouth every evening.   fenofibrate (TRICOR) 145 MG tablet Take 145 mg by mouth every evening.   fluorometholone (FML) 0.1 % ophthalmic suspension Place 1 drop into both eyes 3 (three) times daily as needed (allergy eyes.).   gabapentin (NEURONTIN) 100 MG capsule Take 1 capsule (100 mg total) by mouth 3 (three) times daily. (Patient taking differently: Take 100 mg by mouth at bedtime.)   glipiZIDE (GLUCOTROL) 5 MG tablet Take 1 tablet (5 mg total) by mouth daily before breakfast.   insulin degludec (TRESIBA FLEXTOUCH) 200 UNIT/ML FlexTouch Pen Inject 70 Units into the skin at bedtime.   Insulin Pen Needle (B-D ULTRAFINE III SHORT PEN) 31G X 8 MM MISC 1 each by Does not apply route as directed.   isosorbide mononitrate (IMDUR) 60 MG 24 hr tablet Take 1 tablet (60 mg total) by mouth daily.   levocetirizine (XYZAL) 5 MG tablet Take 5 mg by mouth every evening.   metFORMIN (GLUCOPHAGE) 500 MG tablet Take 2 tablets (1,000 mg total) by mouth 2 (two) times daily with a meal.   modafinil (PROVIGIL) 100 MG tablet Take 1 tablet (100 mg total) by mouth daily.   montelukast (SINGULAIR) 10 MG tablet Take 10 mg by mouth at bedtime.   Multiple Vitamin (MULTIVITAMIN ADULT PO) Take 1 tablet by mouth daily.   Omega-3 Fatty Acids (FISH OIL) 1000 MG CAPS Take 1,000 mg by mouth 2 (two) times daily. Morning and evening   omeprazole (PRILOSEC) 40 MG capsule Take 40 mg by mouth every evening.   rosuvastatin (CRESTOR) 10 MG tablet Take 10 mg by mouth every evening.   [DISCONTINUED] EUTHYROX 25 MCG tablet Take 50 mcg by mouth daily before breakfast.   [DISCONTINUED] insulin degludec (TRESIBA FLEXTOUCH) 200 UNIT/ML FlexTouch Pen Inject 60 Units into the skin at bedtime.   [DISCONTINUED] warfarin (COUMADIN) 5 MG tablet Take 1/2  a tablet to 1 tablet by mouth daily as directed by the coumadin clinic.   No facility-administered encounter medications on file as of 04/02/2022.    ALLERGIES: Allergies  Allergen Reactions   Azithromycin Hives   Codeine Nausea Only   Atomoxetine Nausea And Vomiting  Atorvastatin Other (See Comments)    Restless legs   Lisinopril Nausea And Vomiting    VACCINATION STATUS: Immunization History  Administered Date(s) Administered   Fluad Quad(high Dose 65+) 05/14/2021   Moderna SARS-COV2 Booster Vaccination 06/13/2020   Moderna Sars-Covid-2 Vaccination 09/06/2019, 10/04/2019   Pneumococcal Polysaccharide-23 04/22/2017   Pneumococcal-Unspecified 05/31/2020   Tdap 04/22/2017   Zoster Recombinat (Shingrix) 11/30/2017, 02/20/2018    Diabetes She presents for her follow-up diabetic visit. She has type 2 diabetes mellitus. Onset time: Patient was diagnosed at approximate age of 49 years. Her disease course has been improving. There are no hypoglycemic associated symptoms. Pertinent negatives for hypoglycemia include no confusion, headaches, pallor or seizures. Pertinent negatives for diabetes include no blurred vision, no chest pain, no fatigue, no polydipsia, no polyphagia and no polyuria. There are no hypoglycemic complications. Symptoms are improving. Diabetic complications include heart disease and peripheral neuropathy. Risk factors for coronary artery disease include dyslipidemia, diabetes mellitus, family history, obesity, post-menopausal, sedentary lifestyle, tobacco exposure and hypertension. Current diabetic treatments: She is currently on metformin 1000 mg p.o. twice daily, glipizide 7.5 mg p.o. daily. Her weight is increasing steadily. She is following a generally unhealthy diet. When asked about meal planning, she reported none. She never participates in exercise. Her home blood glucose trend is decreasing steadily. Her breakfast blood glucose range is generally >200 mg/dl. Her  lunch blood glucose range is generally >200 mg/dl. Her dinner blood glucose range is generally >200 mg/dl. Her bedtime blood glucose range is generally >200 mg/dl. Her overall blood glucose range is >200 mg/dl. (She presents with her CGM device.  AGP report shows 38% time in range, 36% level 1 hyperglycemia, 26% level 2 hyperglycemia.  Her average blood glucose is 203.  Her point-of-care A1c is 9% improving from 11.5%.    ) An ACE inhibitor/angiotensin II receptor blocker is not being taken. Eye exam is current.  Hyperlipidemia This is a chronic problem. The current episode started more than 1 year ago. The problem is uncontrolled. Exacerbating diseases include diabetes, hypothyroidism and obesity. Pertinent negatives include no chest pain, myalgias or shortness of breath. Current antihyperlipidemic treatment includes statins, fibric acid derivatives and bile acid sequestrants. Risk factors for coronary artery disease include family history, dyslipidemia, obesity, a sedentary lifestyle, post-menopausal, hypertension and diabetes mellitus.  Hypertension This is a chronic problem. The current episode started more than 1 year ago. Pertinent negatives include no blurred vision, chest pain, headaches, palpitations or shortness of breath. Risk factors for coronary artery disease include dyslipidemia, diabetes mellitus, family history, obesity, post-menopausal state, smoking/tobacco exposure and sedentary lifestyle. Past treatments include direct vasodilators. Hypertensive end-organ damage includes CAD/MI.     Review of Systems  Constitutional:  Negative for chills, fatigue, fever and unexpected weight change.  HENT:  Negative for trouble swallowing and voice change.   Eyes:  Negative for blurred vision and visual disturbance.  Respiratory:  Negative for cough, shortness of breath and wheezing.   Cardiovascular:  Negative for chest pain, palpitations and leg swelling.  Gastrointestinal:  Negative for  diarrhea, nausea and vomiting.  Endocrine: Negative for cold intolerance, heat intolerance, polydipsia, polyphagia and polyuria.  Musculoskeletal:  Negative for arthralgias and myalgias.  Skin:  Negative for color change, pallor, rash and wound.  Neurological:  Negative for seizures and headaches.  Psychiatric/Behavioral:  Negative for confusion and suicidal ideas.     Objective:       04/02/2022    2:09 PM 03/23/2022    3:15 PM 03/10/2022  3:14 PM  Vitals with BMI  Height 5\' 4"  5\' 4"  5\' 4"   Weight 223 lbs 10 oz 218 lbs 224 lbs  BMI 38.36 0000000 [id number]  Systolic [id number] 0000000   Diastolic 56 62   Pulse 68 57     BP (!) 108/56   Pulse 68   Ht 5\' 4"  (1.626 m)   Wt 223 lb 9.6 oz (101.4 kg)   BMI 38.38 kg/m   Wt Readings from Last 3 Encounters:  04/02/22 223 lb 9.6 oz (101.4 kg)  03/23/22 218 lb (98.9 kg)  03/10/22 224 lb (101.6 kg)         CMP ( most recent) CMP     Component Value Date/Time   NA 137 06/13/2021 1030   NA 135 06/13/2021 1030   K 4.3 06/13/2021 1030   K 4.3 06/13/2021 1030   CL 99 06/11/2021 1331   CO2 25 06/11/2021 1331   GLUCOSE 338 (H) 06/11/2021 1331   BUN 12 09/25/2021 0000   CREATININE 0.8 09/25/2021 0000   CREATININE 0.88 06/11/2021 1331   CALCIUM 9.8 09/25/2021 0000   PROT 7.0 07/09/2012 1736   ALBUMIN 3.6 07/09/2012 1736   AST 42 (A) 09/25/2021 0000   ALT 29 09/25/2021 0000   ALKPHOS 72 07/09/2012 1736   BILITOT 0.2 (L) 07/09/2012 1736   GFRNONAA >60 06/11/2021 1331   GFRAA >90 07/09/2012 1736    Recent Results (from the past 2160 hour(s))  POCT INR     Status: Normal   Collection Time: 01/13/22  3:28 PM  Result Value Ref Range   INR 2.6 2.0 - 3.0  POCT INR     Status: Abnormal   Collection Time: 01/19/22  2:29 PM  Result Value Ref Range   INR 3.4 (A) 2.0 - 3.0  POCT INR     Status: Abnormal   Collection Time: 01/29/22  1:45 PM  Result Value Ref Range   INR 3.9 (A) 2.0 - 3.0  POCT INR     Status: Abnormal   Collection Time:  02/16/22  2:14 PM  Result Value Ref Range   INR 1.8 (A) 2.0 - 3.0  POCT INR     Status: Abnormal   Collection Time: 03/10/22  9:19 AM  Result Value Ref Range   INR 1.1 (A) 2.0 - 3.0  POCT INR     Status: Abnormal   Collection Time: 03/24/22  3:31 PM  Result Value Ref Range   INR 1.3 (A) 2.0 - 3.0  TSH     Status: Abnormal   Collection Time: 03/31/22 12:00 AM  Result Value Ref Range   TSH 7.99 (A) 0.41 - 5.90    Comment: T4 9.0  POCT INR     Status: Normal   Collection Time: 03/31/22  2:15 PM  Result Value Ref Range   INR 2.6 2.0 - 3.0  HgB A1c     Status: Abnormal   Collection Time: 04/02/22  2:27 PM  Result Value Ref Range   Hemoglobin A1C     HbA1c POC (<> result, manual entry)     HbA1c, POC (prediabetic range)     HbA1c, POC (controlled diabetic range) 9.0 (A) 0.0 - 7.0 %  POCT UA - Microalbumin     Status: None   Collection Time: 04/02/22  2:28 PM  Result Value Ref Range   Microalbumin Ur, POC 10 mg/L   Creatinine, POC 200 mg/dL   Albumin/Creatinine Ratio, Urine, POC <30  Assessment & Plan:   1. DM type 2 causing vascular disease (HCC) 2. Mixed hyperlipidemia 3. Essential hypertension, benign 4. Hypothyroidism, unspecified type 5. Vitamin D deficiency 6. Class 2 severe obesity due to excess calories with serious comorbidity and body mass index  of 37.0 to 37.9 in adult (HCC) 7. Non-alcoholic fatty liver disease 8. Sleep apnea, unspecified type  - Rachel Odonnell has currently uncontrolled symptomatic type 2 DM since  66 years of age.  She presents with her CGM device.  AGP report shows 38% time in range, 36% level 1 hyperglycemia, 26% level 2 hyperglycemia.  Her average blood glucose is 203.  Her point-of-care A1c is 9% improving from 11.5%.     Recent labs reviewed. - I had a long discussion with her about the progressive nature of diabetes and the pathology behind its complications. -her diabetes is complicated by coronary artery disease, peripheral  neuropathy, obesity, sedentary life, nonalcoholic fatty liver disease and she remains at extremely  high risk for more acute and chronic complications which include CAD, CVA, CKD, retinopathy, and neuropathy. These are all discussed in detail with her.  - I discussed all available options of managing her diabetes including de-escalation of medications.  Considering her associated comorbidities including sleep apnea requiring CPAP, nonalcoholic fatty liver disease, hypertension, hyperlipidemia, she is a perfect candidate for lifestyle medicine.    I have counseled her on diet  and weight management  by adopting a Whole Food , Plant Predominant  ( WFPP) nutrition as recommended by [employer] of Lifestyle Medicine. Patient is encouraged to switch to  unprocessed or minimally processed  complex starch, adequate protein intake (mainly plant source), minimal liquid fat ( mainly vegetable oils), plenty of fruits, and vegetables. -  she is advised to stick to a routine mealtimes to eat 3 complete meals a day and snack only when necessary ( to snack only to correct hypoglycemia BG <70 day time or <100 at night).   - she acknowledges that there is a room for improvement in her food and drink choices. - Suggestion is made for her to avoid simple carbohydrates  from her diet including Cakes, Sweet Desserts, Ice Cream, Soda (diet and regular), Sweet Tea, Candies, Chips, Cookies, Store Bought Juices, Alcohol , Artificial Sweeteners,  Coffee Creamer, and "Sugar-free" Products, Lemonade. This will help patient to have more stable blood glucose profile and potentially avoid unintended weight gain.  The following Lifestyle Medicine recommendations according to American College of Lifestyle Medicine  [facility]) were discussed and and offered to patient and she  agrees to start the journey:  A. Whole Foods, Plant-Based Nutrition comprising of fruits and vegetables, plant-based proteins, whole-grain carbohydrates was  discussed in detail with the patient.   A list for source of those nutrients were also provided to the patient.  Patient will use only water or unsweetened tea for hydration. B.  The need to stay away from risky substances including alcohol, smoking; obtaining 7 to 9 hours of restorative sleep, at least 150 minutes of moderate intensity exercise weekly, the importance of healthy social connections,  and stress management techniques were discussed. C.  A full color page of  Calorie density of various food groups per pound showing examples of each food groups was provided to the patient.   - she has been scheduled with Norm Salt, RDN, CDE for individualized diabetes education.  - I have approached her with the following plan to manage  her diabetes and patient agrees:   -  Unfortunately, her engagement for lifestyle medicine is not optimal.   She will continue to need a higher dose of insulin.  I discussed and increase her Tresiba to 70 units nightly.  She is encouraged to continue to use her CGM continuously.    She is advised to continue metformin 1000 mg p.o. twice daily, advised to lower glipizide to 5 mg p.o. daily at breakfast.  - she is encouraged to call clinic for blood glucose levels less than 70 or above 200 mg /dl. - she will be considered for incretin therapy as appropriate next visit  if she presents with persistent  postprandial-hyperglycemia averaging greater than 250 mg/day.  -She did not tolerate Jardiance in a prior attempt.   - Specific targets for  A1c;  LDL, HDL,  and Triglycerides were discussed with the patient.  2) Blood Pressure /Hypertension:    -Her blood pressure is controlled to target.  she is advised to continue her current medications including Imdur 60 mg p.o. daily with breakfast .   3) Lipids/Hyperlipidemia:   Review of her recent lipid panel showed uncontrolled hypertriglyceridemia at 421, LDL at 55.   She is advised to continue on her current triple  agent therapy including fenofibrate 145 mg p.o. daily, omega-3 fatty acids 1000 mg p.o. twice daily, and Crestor 10 mg p.o. daily at bedtime.  She has a chance to simplify this treatment with proper engagement in lifestyle medicine.  She is hesitant to engage with lifestyle medicine for now.   Side effects and precautions discussed with her.  4)  Weight/Diet:  Body mass index is 38.38 kg/m.  -   clearly complicating her diabetes care.   she is  a candidate for weight loss. I discussed with her the fact that loss of 5 - 10% of her  current body weight will have the most impact on her diabetes management.  The above detailed  ACLM recommendations for nutrition, exercise, sleep, social life, avoidance of risky substances, the need for restorative sleep   information will also detailed on discharge instructions.  5) hypothyroidism: The circumstances of her diagnosis are not available to review.  Her previsit thyroid function tests are consistent with under replacement.  I discussed and increase her levothyroxine to 50 mcg p.o. daily before breakfast.    - We discussed about the correct intake of her thyroid hormone, on empty stomach at fasting, with water, separated by at least 30 minutes from breakfast and other medications,  and separated by more than 4 hours from calcium, iron, multivitamins, acid reflux medications (PPIs). -Patient is made aware of the fact that thyroid hormone replacement is needed for life, dose to be adjusted by periodic monitoring of thyroid function tests.   6) Chronic Care/Health Maintenance:  -she  is on Statin medications and  is encouraged to initiate and continue to follow up with Ophthalmology, Dentist,  Podiatrist at least yearly or according to recommendations, and advised to   stay away from smoking. I have recommended yearly flu vaccine and pneumonia vaccine at least every 5 years; moderate intensity exercise for up to 150 minutes weekly; and  sleep for 7- 9 hours a  day.  -She will need to have vitamin D supplement with vitamin D3 5000 units daily x90 days.  - she is  advised to maintain close follow up with Rosalee Kaufman, PA-C for primary care needs, as well as her other providers for optimal and coordinated care.   I spent 42 minutes in the  care of the patient today including review of labs from [place], Lipids, Thyroid Function, Hematology (current and previous including abstractions from other facilities); face-to-face time discussing  her blood glucose readings/logs, discussing hypoglycemia and hyperglycemia episodes and symptoms, medications doses, her options of short and long term treatment based on the latest standards of care / guidelines;  discussion about incorporating lifestyle medicine;  and documenting the encounter. Risk reduction counseling performed per USPSTF guidelines to reduce  obesity and cardiovascular risk factors.     Please refer to Patient Instructions for Blood Glucose Monitoring and Insulin/Medications Dosing Guide"  in media tab for additional information. Please  also refer to " Patient Self Inventory" in the Media  tab for reviewed elements of pertinent patient history.  Nizhoni Truong Stegner participated in the discussions, expressed understanding, and voiced agreement with the above plans.  All questions were answered to her satisfaction. she is encouraged to contact clinic should she have any questions or concerns prior to her return visit.    Follow up plan: - Return in about 3 months (around 07/03/2022) for F/U with Pre-visit Labs, Meter/CGM/Logs, A1c here.  Glade Lloyd, MD [facility] [address] [place], [place] [address] Phone: [phone]  Fax: [phone]    04/02/2022, 7:16 PM  This note was partially dictated with voice recognition software. Similar sounding words can be transcribed inadequately or may not  be corrected upon review.

## 2022-04-02 NOTE — Telephone Encounter (Signed)
New message    Seen today, patient forgot what her A1C levels were asking for a call back

## 2022-04-02 NOTE — Telephone Encounter (Signed)
Prescription refill request received for warfarin Lov: 12/11/2021 Next INR check: 8/28 Warfarin tablet strength: 5mg 

## 2022-04-02 NOTE — Patient Instructions (Signed)

## 2022-04-03 ENCOUNTER — Other Ambulatory Visit: Payer: Self-pay | Admitting: Pulmonary Disease

## 2022-04-05 DIAGNOSIS — E1165 Type 2 diabetes mellitus with hyperglycemia: Secondary | ICD-10-CM | POA: Diagnosis not present

## 2022-04-06 NOTE — Telephone Encounter (Signed)
Please advise on refill request

## 2022-04-13 ENCOUNTER — Ambulatory Visit: Payer: Medicare HMO | Attending: Cardiology | Admitting: *Deleted

## 2022-04-13 DIAGNOSIS — Z20828 Contact with and (suspected) exposure to other viral communicable diseases: Secondary | ICD-10-CM | POA: Diagnosis not present

## 2022-04-13 DIAGNOSIS — R059 Cough, unspecified: Secondary | ICD-10-CM | POA: Diagnosis not present

## 2022-04-13 DIAGNOSIS — R030 Elevated blood-pressure reading, without diagnosis of hypertension: Secondary | ICD-10-CM | POA: Diagnosis not present

## 2022-04-13 DIAGNOSIS — Z6837 Body mass index (BMI) 37.0-37.9, adult: Secondary | ICD-10-CM | POA: Diagnosis not present

## 2022-04-13 DIAGNOSIS — I4821 Permanent atrial fibrillation: Secondary | ICD-10-CM

## 2022-04-13 DIAGNOSIS — R6883 Chills (without fever): Secondary | ICD-10-CM | POA: Diagnosis not present

## 2022-04-13 DIAGNOSIS — Z5181 Encounter for therapeutic drug level monitoring: Secondary | ICD-10-CM | POA: Diagnosis not present

## 2022-04-13 LAB — POCT INR: INR: 3.2 — AB (ref 2.0–3.0)

## 2022-04-13 NOTE — Patient Instructions (Signed)
Hold warfarin tonight then resume 1 tablet daily except 1/2 tablet on Sundays Recheck in 2 wks

## 2022-04-14 ENCOUNTER — Telehealth: Payer: Self-pay | Admitting: Cardiology

## 2022-04-14 NOTE — Telephone Encounter (Signed)
Called and spoke with pt after discussing interactions of warfarin and paxlovid with C Pavero PharmD.  Recommendation is for pt to continue current dose of warfarin and come for early recheck of INR.  INR appt moved up to 04/21/22.  She is in agreement.

## 2022-04-14 NOTE — Telephone Encounter (Signed)
Pt c/o medication issue:  1. Name of Medication:   warfarin (COUMADIN) 5 MG tablet  2. How are you currently taking this medication (dosage and times per day)? As prescribed  3. Are you having a reaction (difficulty breathing--STAT)?  No  4. What is your medication issue?    Patient called concerned that she has tested positive for COVID and has been prescribed paxlovid and the pharmacist told the paxlovid may interact with the warfarin.  Patient stated she is concerned about taking the warfarin this evening.

## 2022-04-15 DIAGNOSIS — I4891 Unspecified atrial fibrillation: Secondary | ICD-10-CM | POA: Diagnosis not present

## 2022-04-16 ENCOUNTER — Telehealth: Payer: Self-pay | Admitting: "Endocrinology

## 2022-04-16 NOTE — Telephone Encounter (Signed)
Pt states she is being treated for covid, since starting medications her BS has been elevated. She has three more days left of treatment. Her BG was 410 before bed last night and 204 this morning before breakfast. She takes metformin 1000mg  bid, glipizide 5mg  daily and tresiba 70 units qhs.

## 2022-04-16 NOTE — Telephone Encounter (Signed)
New message   Pt c/o medication issue:  1. Name of Medication: Paxlovid   2. How are you currently taking this medication (dosage and times per day)? 3 twice a day   3. Are you having a reaction (difficulty breathing--STAT)? No   4. What is your medication issue? Blood sugar are high . Yesterday said High twice  . Finger stick 412.  Blood sugar this morning  204.

## 2022-04-16 NOTE — Telephone Encounter (Signed)
Discussed with pt, understanding voiced. 

## 2022-04-16 NOTE — Telephone Encounter (Signed)
Left a message requesting pt return call to the office. ?

## 2022-04-17 ENCOUNTER — Other Ambulatory Visit: Payer: Self-pay | Admitting: "Endocrinology

## 2022-04-17 NOTE — Telephone Encounter (Signed)
Discussed with pt, understanding voiced. 

## 2022-04-17 NOTE — Telephone Encounter (Signed)
Pt called back this morning stated her BG this morning at 5:05a.m was 327, at 7:47a.m 290 and at 8:33a.m was 271. States she did take 80 units of her basal insulin last night.

## 2022-04-21 ENCOUNTER — Ambulatory Visit: Payer: Medicare HMO | Attending: Cardiology | Admitting: *Deleted

## 2022-04-21 DIAGNOSIS — Z5181 Encounter for therapeutic drug level monitoring: Secondary | ICD-10-CM | POA: Diagnosis not present

## 2022-04-21 DIAGNOSIS — I4821 Permanent atrial fibrillation: Secondary | ICD-10-CM | POA: Diagnosis not present

## 2022-04-21 LAB — POCT INR: INR: 1.5 — AB (ref 2.0–3.0)

## 2022-04-21 NOTE — Patient Instructions (Signed)
Take warfarin 2 tablet today, 1 1/2 tablets tomorrow then resume 1 tablet daily except 1/2 tablet on Sundays Recheck in 1 wk

## 2022-04-22 DIAGNOSIS — M9902 Segmental and somatic dysfunction of thoracic region: Secondary | ICD-10-CM | POA: Diagnosis not present

## 2022-04-22 DIAGNOSIS — S134XXA Sprain of ligaments of cervical spine, initial encounter: Secondary | ICD-10-CM | POA: Diagnosis not present

## 2022-04-22 DIAGNOSIS — S338XXA Sprain of other parts of lumbar spine and pelvis, initial encounter: Secondary | ICD-10-CM | POA: Diagnosis not present

## 2022-04-22 DIAGNOSIS — M9903 Segmental and somatic dysfunction of lumbar region: Secondary | ICD-10-CM | POA: Diagnosis not present

## 2022-04-22 DIAGNOSIS — S233XXA Sprain of ligaments of thoracic spine, initial encounter: Secondary | ICD-10-CM | POA: Diagnosis not present

## 2022-04-22 DIAGNOSIS — M9901 Segmental and somatic dysfunction of cervical region: Secondary | ICD-10-CM | POA: Diagnosis not present

## 2022-04-30 ENCOUNTER — Ambulatory Visit: Payer: Medicare HMO | Attending: Cardiology | Admitting: *Deleted

## 2022-04-30 DIAGNOSIS — I4821 Permanent atrial fibrillation: Secondary | ICD-10-CM

## 2022-04-30 DIAGNOSIS — Z5181 Encounter for therapeutic drug level monitoring: Secondary | ICD-10-CM | POA: Diagnosis not present

## 2022-04-30 LAB — POCT INR: INR: 4.1 — AB (ref 2.0–3.0)

## 2022-04-30 NOTE — Patient Instructions (Signed)
Hold warfarin tonight then resume 1 tablet daily except 1/2 tablet on Sundays Recheck in 2 wk

## 2022-05-06 DIAGNOSIS — E1165 Type 2 diabetes mellitus with hyperglycemia: Secondary | ICD-10-CM | POA: Diagnosis not present

## 2022-05-11 ENCOUNTER — Telehealth: Payer: Self-pay | Admitting: "Endocrinology

## 2022-05-11 ENCOUNTER — Ambulatory Visit: Payer: Medicare HMO | Attending: Cardiology | Admitting: *Deleted

## 2022-05-11 DIAGNOSIS — I4821 Permanent atrial fibrillation: Secondary | ICD-10-CM

## 2022-05-11 DIAGNOSIS — E1159 Type 2 diabetes mellitus with other circulatory complications: Secondary | ICD-10-CM

## 2022-05-11 DIAGNOSIS — Z5181 Encounter for therapeutic drug level monitoring: Secondary | ICD-10-CM

## 2022-05-11 LAB — POCT INR: INR: 6.6 — AB (ref 2.0–3.0)

## 2022-05-11 NOTE — Patient Instructions (Signed)
Description   -Hold warfarin 9/25, 9/26 and 9/27  -Then START taking warfarin 1 tablet daily except for 1/2 a tablet on Sunday and Thursday. Recheck INR in 1 week.

## 2022-05-11 NOTE — Telephone Encounter (Signed)
New message  Need clarification of  insulin degludec (TRESIBA FLEXTOUCH) 200 UNIT/ML FlexTouch Pen  Pharmacy [facility] in [place] .

## 2022-05-11 NOTE — Telephone Encounter (Signed)
Pt stated we had increased her Tresiba to 100 units qhs while she was being treated for Covid but since then she backed down to 80 units qhs. Pt's BG Sunday at supper 131, this morning 68 and today around lunch was 123. Over the weekend her sensor had fell off and she was not able to give me any other readings. Pt requested clarification of dose you recommend for her tresiba.

## 2022-05-12 ENCOUNTER — Telehealth: Payer: Self-pay | Admitting: "Endocrinology

## 2022-05-12 DIAGNOSIS — F419 Anxiety disorder, unspecified: Secondary | ICD-10-CM | POA: Diagnosis not present

## 2022-05-12 DIAGNOSIS — J309 Allergic rhinitis, unspecified: Secondary | ICD-10-CM | POA: Diagnosis not present

## 2022-05-12 DIAGNOSIS — G4733 Obstructive sleep apnea (adult) (pediatric): Secondary | ICD-10-CM | POA: Diagnosis not present

## 2022-05-12 DIAGNOSIS — G4710 Hypersomnia, unspecified: Secondary | ICD-10-CM | POA: Diagnosis not present

## 2022-05-12 DIAGNOSIS — Z6838 Body mass index (BMI) 38.0-38.9, adult: Secondary | ICD-10-CM | POA: Diagnosis not present

## 2022-05-12 DIAGNOSIS — E1165 Type 2 diabetes mellitus with hyperglycemia: Secondary | ICD-10-CM | POA: Diagnosis not present

## 2022-05-12 DIAGNOSIS — E785 Hyperlipidemia, unspecified: Secondary | ICD-10-CM | POA: Diagnosis not present

## 2022-05-12 DIAGNOSIS — E039 Hypothyroidism, unspecified: Secondary | ICD-10-CM | POA: Diagnosis not present

## 2022-05-12 DIAGNOSIS — K219 Gastro-esophageal reflux disease without esophagitis: Secondary | ICD-10-CM | POA: Diagnosis not present

## 2022-05-12 MED ORDER — TRESIBA FLEXTOUCH 200 UNIT/ML ~~LOC~~ SOPN: 70.0000 [IU] | PEN_INJECTOR | Freq: Every day | SUBCUTANEOUS | 0 refills | Status: DC

## 2022-05-12 NOTE — Telephone Encounter (Signed)
New message   The patient  PCP is suggest since she is still gaining weight would ozempic along with insulin degludec (TRESIBA FLEXTOUCH) 200 UNIT/ML FlexTouch Pen be an option.

## 2022-05-12 NOTE — Telephone Encounter (Signed)
Discussed with pt, understanding voiced. Rx for Tresiba 70 units qhs sent to [facility].

## 2022-05-13 DIAGNOSIS — M9902 Segmental and somatic dysfunction of thoracic region: Secondary | ICD-10-CM | POA: Diagnosis not present

## 2022-05-13 DIAGNOSIS — S233XXA Sprain of ligaments of thoracic spine, initial encounter: Secondary | ICD-10-CM | POA: Diagnosis not present

## 2022-05-13 DIAGNOSIS — M9901 Segmental and somatic dysfunction of cervical region: Secondary | ICD-10-CM | POA: Diagnosis not present

## 2022-05-13 DIAGNOSIS — S134XXA Sprain of ligaments of cervical spine, initial encounter: Secondary | ICD-10-CM | POA: Diagnosis not present

## 2022-05-13 DIAGNOSIS — S338XXA Sprain of other parts of lumbar spine and pelvis, initial encounter: Secondary | ICD-10-CM | POA: Diagnosis not present

## 2022-05-13 DIAGNOSIS — M9903 Segmental and somatic dysfunction of lumbar region: Secondary | ICD-10-CM | POA: Diagnosis not present

## 2022-05-14 DIAGNOSIS — F339 Major depressive disorder, recurrent, unspecified: Secondary | ICD-10-CM | POA: Diagnosis not present

## 2022-05-18 ENCOUNTER — Ambulatory Visit: Payer: Medicare HMO | Attending: Cardiology | Admitting: *Deleted

## 2022-05-18 DIAGNOSIS — I4821 Permanent atrial fibrillation: Secondary | ICD-10-CM

## 2022-05-18 DIAGNOSIS — Z5181 Encounter for therapeutic drug level monitoring: Secondary | ICD-10-CM

## 2022-05-18 LAB — POCT INR: INR: 2.6 (ref 2.0–3.0)

## 2022-05-18 NOTE — Patient Instructions (Signed)
Continue warfarin 1 tablet daily except for 1/2 a tablet on Sunday and Thursday. Recheck INR in 3 week.

## 2022-06-08 ENCOUNTER — Ambulatory Visit: Payer: Medicare HMO | Attending: Cardiology | Admitting: *Deleted

## 2022-06-08 DIAGNOSIS — I4821 Permanent atrial fibrillation: Secondary | ICD-10-CM

## 2022-06-08 DIAGNOSIS — F419 Anxiety disorder, unspecified: Secondary | ICD-10-CM | POA: Diagnosis not present

## 2022-06-08 DIAGNOSIS — Z5181 Encounter for therapeutic drug level monitoring: Secondary | ICD-10-CM | POA: Diagnosis not present

## 2022-06-08 DIAGNOSIS — F339 Major depressive disorder, recurrent, unspecified: Secondary | ICD-10-CM | POA: Diagnosis not present

## 2022-06-08 LAB — POCT INR: INR: 3.3 — AB (ref 2.0–3.0)

## 2022-06-08 NOTE — Patient Instructions (Signed)
Decrease warfarin to 1 tablet daily except for 1/2 a tablet on Mondays, Wednesdays and Fridays. Recheck INR in 2 week.

## 2022-06-09 ENCOUNTER — Telehealth: Payer: Self-pay | Admitting: "Endocrinology

## 2022-06-09 DIAGNOSIS — E1159 Type 2 diabetes mellitus with other circulatory complications: Secondary | ICD-10-CM

## 2022-06-09 MED ORDER — FREESTYLE LIBRE 2 SENSOR MISC: 2 refills | Status: DC

## 2022-06-09 MED ORDER — GLIPIZIDE 5 MG PO TABS: 5.0000 mg | ORAL_TABLET | Freq: Every day | ORAL | 0 refills | Status: DC

## 2022-06-09 MED ORDER — GLUCOSE BLOOD VI STRP: 1.0000 | ORAL_STRIP | 2 refills | Status: AC | PRN

## 2022-06-09 MED ORDER — ACCU-CHEK SOFTCLIX LANCETS MISC: 2 refills | Status: AC

## 2022-06-09 NOTE — Telephone Encounter (Signed)
Discussed with pt, understanding voiced. 

## 2022-06-09 NOTE — Telephone Encounter (Signed)
Patient called & said she needs her [employer] supplies to go to [facility], she can get it free through them. She needs a refill on her glipizide to go to [facility] in [place]

## 2022-06-09 NOTE — Telephone Encounter (Signed)
F/u   The patient called back with addition pharmacy information   [facility] Team    Phone # [phone] .

## 2022-06-09 NOTE — Telephone Encounter (Signed)
Sent Rx's for libre 2 sensors, glipizide 5mg  daily, accu-chek guide test strips and lancets to [facility].

## 2022-06-09 NOTE — Telephone Encounter (Signed)
[place] her insulin. She said her sugar has been high. She wants to know should she increase.  Pt called with high BG readings.   12:50 338,12:52 pm 326, right now 297  [phone]

## 2022-06-09 NOTE — Telephone Encounter (Signed)
Looks like she is taking Tresiba 70 units SQ nightly.  She can increase her insulin by 10 units (80 units  nightly) until she gets back on track.

## 2022-06-10 DIAGNOSIS — M9903 Segmental and somatic dysfunction of lumbar region: Secondary | ICD-10-CM | POA: Diagnosis not present

## 2022-06-10 DIAGNOSIS — S233XXA Sprain of ligaments of thoracic spine, initial encounter: Secondary | ICD-10-CM | POA: Diagnosis not present

## 2022-06-10 DIAGNOSIS — M9902 Segmental and somatic dysfunction of thoracic region: Secondary | ICD-10-CM | POA: Diagnosis not present

## 2022-06-10 DIAGNOSIS — S134XXA Sprain of ligaments of cervical spine, initial encounter: Secondary | ICD-10-CM | POA: Diagnosis not present

## 2022-06-10 DIAGNOSIS — S338XXA Sprain of other parts of lumbar spine and pelvis, initial encounter: Secondary | ICD-10-CM | POA: Diagnosis not present

## 2022-06-10 DIAGNOSIS — M9901 Segmental and somatic dysfunction of cervical region: Secondary | ICD-10-CM | POA: Diagnosis not present

## 2022-06-18 ENCOUNTER — Telehealth: Payer: Self-pay | Admitting: Pulmonary Disease

## 2022-06-18 DIAGNOSIS — N210 Calculus in bladder: Secondary | ICD-10-CM | POA: Diagnosis not present

## 2022-06-18 DIAGNOSIS — M47816 Spondylosis without myelopathy or radiculopathy, lumbar region: Secondary | ICD-10-CM | POA: Diagnosis not present

## 2022-06-18 DIAGNOSIS — K760 Fatty (change of) liver, not elsewhere classified: Secondary | ICD-10-CM | POA: Diagnosis not present

## 2022-06-18 DIAGNOSIS — I7090 Unspecified atherosclerosis: Secondary | ICD-10-CM | POA: Diagnosis not present

## 2022-06-18 DIAGNOSIS — N132 Hydronephrosis with renal and ureteral calculous obstruction: Secondary | ICD-10-CM | POA: Diagnosis not present

## 2022-06-18 DIAGNOSIS — F419 Anxiety disorder, unspecified: Secondary | ICD-10-CM | POA: Diagnosis not present

## 2022-06-18 DIAGNOSIS — J439 Emphysema, unspecified: Secondary | ICD-10-CM | POA: Diagnosis not present

## 2022-06-18 DIAGNOSIS — N201 Calculus of ureter: Secondary | ICD-10-CM | POA: Diagnosis not present

## 2022-06-18 DIAGNOSIS — E785 Hyperlipidemia, unspecified: Secondary | ICD-10-CM | POA: Diagnosis not present

## 2022-06-18 DIAGNOSIS — I4891 Unspecified atrial fibrillation: Secondary | ICD-10-CM | POA: Diagnosis not present

## 2022-06-18 DIAGNOSIS — E119 Type 2 diabetes mellitus without complications: Secondary | ICD-10-CM | POA: Diagnosis not present

## 2022-06-18 DIAGNOSIS — R109 Unspecified abdominal pain: Secondary | ICD-10-CM | POA: Diagnosis not present

## 2022-06-18 DIAGNOSIS — Z86010 Personal history of colonic polyps: Secondary | ICD-10-CM | POA: Diagnosis not present

## 2022-06-19 NOTE — Telephone Encounter (Signed)
Please advise on refill request

## 2022-06-22 ENCOUNTER — Ambulatory Visit: Payer: Medicare HMO | Attending: Cardiology | Admitting: *Deleted

## 2022-06-22 DIAGNOSIS — I4821 Permanent atrial fibrillation: Secondary | ICD-10-CM | POA: Diagnosis not present

## 2022-06-22 DIAGNOSIS — Z5181 Encounter for therapeutic drug level monitoring: Secondary | ICD-10-CM

## 2022-06-22 LAB — POCT INR: INR: 2.5 (ref 2.0–3.0)

## 2022-06-22 NOTE — Patient Instructions (Signed)
Continue warfarin 1 tablet daily except for 1/2 a tablet on Mondays, Wednesdays and Fridays. Recheck INR in 4 weeks.

## 2022-06-24 DIAGNOSIS — M9903 Segmental and somatic dysfunction of lumbar region: Secondary | ICD-10-CM | POA: Diagnosis not present

## 2022-06-24 DIAGNOSIS — S134XXA Sprain of ligaments of cervical spine, initial encounter: Secondary | ICD-10-CM | POA: Diagnosis not present

## 2022-06-24 DIAGNOSIS — S233XXA Sprain of ligaments of thoracic spine, initial encounter: Secondary | ICD-10-CM | POA: Diagnosis not present

## 2022-06-24 DIAGNOSIS — M9901 Segmental and somatic dysfunction of cervical region: Secondary | ICD-10-CM | POA: Diagnosis not present

## 2022-06-24 DIAGNOSIS — M9902 Segmental and somatic dysfunction of thoracic region: Secondary | ICD-10-CM | POA: Diagnosis not present

## 2022-06-24 DIAGNOSIS — S338XXA Sprain of other parts of lumbar spine and pelvis, initial encounter: Secondary | ICD-10-CM | POA: Diagnosis not present

## 2022-06-24 DIAGNOSIS — F339 Major depressive disorder, recurrent, unspecified: Secondary | ICD-10-CM | POA: Diagnosis not present

## 2022-07-03 DIAGNOSIS — N200 Calculus of kidney: Secondary | ICD-10-CM | POA: Diagnosis not present

## 2022-07-03 DIAGNOSIS — Z6838 Body mass index (BMI) 38.0-38.9, adult: Secondary | ICD-10-CM | POA: Diagnosis not present

## 2022-07-03 DIAGNOSIS — F419 Anxiety disorder, unspecified: Secondary | ICD-10-CM | POA: Diagnosis not present

## 2022-07-03 DIAGNOSIS — G4733 Obstructive sleep apnea (adult) (pediatric): Secondary | ICD-10-CM | POA: Diagnosis not present

## 2022-07-03 DIAGNOSIS — E039 Hypothyroidism, unspecified: Secondary | ICD-10-CM | POA: Diagnosis not present

## 2022-07-03 DIAGNOSIS — K219 Gastro-esophageal reflux disease without esophagitis: Secondary | ICD-10-CM | POA: Diagnosis not present

## 2022-07-03 DIAGNOSIS — E785 Hyperlipidemia, unspecified: Secondary | ICD-10-CM | POA: Diagnosis not present

## 2022-07-03 DIAGNOSIS — J309 Allergic rhinitis, unspecified: Secondary | ICD-10-CM | POA: Diagnosis not present

## 2022-07-03 DIAGNOSIS — E1165 Type 2 diabetes mellitus with hyperglycemia: Secondary | ICD-10-CM | POA: Diagnosis not present

## 2022-07-06 NOTE — Telephone Encounter (Signed)
PMP was reviewed, note reviewed ,. Keep follow up with Dr. Vassie Loll

## 2022-07-06 NOTE — Telephone Encounter (Signed)
Rachel Odonnell is out of office until end of November. Will send to [facility] to see if she can refill it or if we should wait until Dr. Vassie Loll is back in office. Rubye Oaks NP please advise. Thank you!

## 2022-07-07 ENCOUNTER — Other Ambulatory Visit: Payer: Self-pay

## 2022-07-07 ENCOUNTER — Ambulatory Visit: Payer: Medicare HMO | Admitting: "Endocrinology

## 2022-07-07 ENCOUNTER — Telehealth: Payer: Self-pay | Admitting: "Endocrinology

## 2022-07-07 DIAGNOSIS — E1159 Type 2 diabetes mellitus with other circulatory complications: Secondary | ICD-10-CM

## 2022-07-07 MED ORDER — FREESTYLE LIBRE 2 SENSOR MISC: 2 refills | Status: DC

## 2022-07-07 NOTE — Telephone Encounter (Signed)
Rx sent 

## 2022-07-07 NOTE — Telephone Encounter (Signed)
New message      1. Which medications need to be refilled? (please list name of each medication and dose if known) Continuous Blood Gluc Sensor (FREESTYLE LIBRE 2 SENSOR) MISC   2. Which pharmacy/location (including street and city if local pharmacy) is medication to be sent to? [facility] in [place]  3. Do they need a 30 day or 90 day supply?

## 2022-07-07 NOTE — Telephone Encounter (Signed)
Sent Rx for [employer] 2 sensors to [facility] in [place].

## 2022-07-15 DIAGNOSIS — S233XXA Sprain of ligaments of thoracic spine, initial encounter: Secondary | ICD-10-CM | POA: Diagnosis not present

## 2022-07-15 DIAGNOSIS — M9901 Segmental and somatic dysfunction of cervical region: Secondary | ICD-10-CM | POA: Diagnosis not present

## 2022-07-15 DIAGNOSIS — M9903 Segmental and somatic dysfunction of lumbar region: Secondary | ICD-10-CM | POA: Diagnosis not present

## 2022-07-15 DIAGNOSIS — M9902 Segmental and somatic dysfunction of thoracic region: Secondary | ICD-10-CM | POA: Diagnosis not present

## 2022-07-15 DIAGNOSIS — S338XXA Sprain of other parts of lumbar spine and pelvis, initial encounter: Secondary | ICD-10-CM | POA: Diagnosis not present

## 2022-07-15 DIAGNOSIS — S134XXA Sprain of ligaments of cervical spine, initial encounter: Secondary | ICD-10-CM | POA: Diagnosis not present

## 2022-07-20 ENCOUNTER — Ambulatory Visit: Payer: Medicare HMO | Attending: Cardiology | Admitting: *Deleted

## 2022-07-20 DIAGNOSIS — Z5181 Encounter for therapeutic drug level monitoring: Secondary | ICD-10-CM

## 2022-07-20 DIAGNOSIS — I4821 Permanent atrial fibrillation: Secondary | ICD-10-CM

## 2022-07-20 LAB — POCT INR: INR: 2.9 (ref 2.0–3.0)

## 2022-07-20 NOTE — Patient Instructions (Signed)
Continue warfarin 1 tablet daily except for 1/2 a tablet on Mondays, Wednesdays and Fridays. Recheck INR in 4 weeks.

## 2022-07-21 DIAGNOSIS — R4189 Other symptoms and signs involving cognitive functions and awareness: Secondary | ICD-10-CM | POA: Diagnosis not present

## 2022-07-21 DIAGNOSIS — F339 Major depressive disorder, recurrent, unspecified: Secondary | ICD-10-CM | POA: Diagnosis not present

## 2022-07-21 DIAGNOSIS — F419 Anxiety disorder, unspecified: Secondary | ICD-10-CM | POA: Diagnosis not present

## 2022-07-23 ENCOUNTER — Other Ambulatory Visit: Payer: Self-pay

## 2022-07-23 ENCOUNTER — Other Ambulatory Visit: Payer: Self-pay | Admitting: "Endocrinology

## 2022-07-23 DIAGNOSIS — E1159 Type 2 diabetes mellitus with other circulatory complications: Secondary | ICD-10-CM

## 2022-07-23 MED ORDER — FREESTYLE LIBRE 2 SENSOR MISC: 0 refills | Status: DC

## 2022-07-28 DIAGNOSIS — E1159 Type 2 diabetes mellitus with other circulatory complications: Secondary | ICD-10-CM | POA: Diagnosis not present

## 2022-07-28 DIAGNOSIS — E039 Hypothyroidism, unspecified: Secondary | ICD-10-CM | POA: Diagnosis not present

## 2022-07-29 LAB — COMPREHENSIVE METABOLIC PANEL
ALT: 28 IU/L (ref 0–32)
AST: 41 IU/L — ABNORMAL HIGH (ref 0–40)
Albumin/Globulin Ratio: 1.5 (ref 1.2–2.2)
Albumin: 4.6 g/dL (ref 3.9–4.9)
Alkaline Phosphatase: 92 IU/L (ref 44–121)
BUN/Creatinine Ratio: 20 (ref 12–28)
BUN: 18 mg/dL (ref 8–27)
Bilirubin Total: 0.4 mg/dL (ref 0.0–1.2)
CO2: 21 mmol/L (ref 20–29)
Calcium: 10.9 mg/dL — ABNORMAL HIGH (ref 8.7–10.3)
Chloride: 97 mmol/L (ref 96–106)
Creatinine, Ser: 0.9 mg/dL (ref 0.57–1.00)
Globulin, Total: 3 g/dL (ref 1.5–4.5)
Glucose: 268 mg/dL — ABNORMAL HIGH (ref 70–99)
Potassium: 4.7 mmol/L (ref 3.5–5.2)
Sodium: 141 mmol/L (ref 134–144)
Total Protein: 7.6 g/dL (ref 6.0–8.5)
eGFR: 70 mL/min/{1.73_m2} (ref >59)

## 2022-07-29 LAB — LIPID PANEL
Chol/HDL Ratio: 6.1 ratio — ABNORMAL HIGH (ref 0.0–4.4)
Cholesterol, Total: 219 mg/dL — ABNORMAL HIGH (ref 100–199)
HDL: 36 mg/dL — ABNORMAL LOW (ref >39)
LDL Chol Calc (NIH): 120 mg/dL — ABNORMAL HIGH (ref 0–99)
Triglycerides: 359 mg/dL — ABNORMAL HIGH (ref 0–149)
VLDL Cholesterol Cal: 63 mg/dL — ABNORMAL HIGH (ref 5–40)

## 2022-07-29 LAB — TSH: TSH: 2.92 u[IU]/mL (ref 0.450–4.500)

## 2022-07-29 LAB — T4, FREE: Free T4: 1.38 ng/dL (ref 0.82–1.77)

## 2022-08-04 DIAGNOSIS — E1165 Type 2 diabetes mellitus with hyperglycemia: Secondary | ICD-10-CM | POA: Diagnosis not present

## 2022-08-05 ENCOUNTER — Ambulatory Visit: Payer: Medicare HMO | Admitting: "Endocrinology

## 2022-08-05 ENCOUNTER — Telehealth: Payer: Self-pay | Admitting: "Endocrinology

## 2022-08-05 DIAGNOSIS — E1159 Type 2 diabetes mellitus with other circulatory complications: Secondary | ICD-10-CM

## 2022-08-05 MED ORDER — TRESIBA FLEXTOUCH 200 UNIT/ML ~~LOC~~ SOPN: 80.0000 [IU] | PEN_INJECTOR | Freq: Every day | SUBCUTANEOUS | 0 refills | Status: DC

## 2022-08-05 NOTE — Telephone Encounter (Signed)
Rx refill sent.

## 2022-08-05 NOTE — Telephone Encounter (Signed)
New message   1. Which medications need to be refilled? (please list name of each medication and dose if known) insulin degludec (TRESIBA FLEXTOUCH) 200 UNIT/ML FlexTouch Pen   2. Which pharmacy/location (including street and city if local pharmacy) is medication to be sent to? [facility] in [place]    3. Do they need a 30 day or 90 day supply? 90 day supply

## 2022-08-06 DIAGNOSIS — F339 Major depressive disorder, recurrent, unspecified: Secondary | ICD-10-CM | POA: Diagnosis not present

## 2022-08-06 DIAGNOSIS — E039 Hypothyroidism, unspecified: Secondary | ICD-10-CM | POA: Diagnosis not present

## 2022-08-06 DIAGNOSIS — F419 Anxiety disorder, unspecified: Secondary | ICD-10-CM | POA: Diagnosis not present

## 2022-08-06 DIAGNOSIS — K219 Gastro-esophageal reflux disease without esophagitis: Secondary | ICD-10-CM | POA: Diagnosis not present

## 2022-08-06 DIAGNOSIS — G4733 Obstructive sleep apnea (adult) (pediatric): Secondary | ICD-10-CM | POA: Diagnosis not present

## 2022-08-06 DIAGNOSIS — F0390 Unspecified dementia without behavioral disturbance: Secondary | ICD-10-CM | POA: Diagnosis not present

## 2022-08-06 DIAGNOSIS — E1165 Type 2 diabetes mellitus with hyperglycemia: Secondary | ICD-10-CM | POA: Diagnosis not present

## 2022-08-06 DIAGNOSIS — E785 Hyperlipidemia, unspecified: Secondary | ICD-10-CM | POA: Diagnosis not present

## 2022-08-06 DIAGNOSIS — N200 Calculus of kidney: Secondary | ICD-10-CM | POA: Diagnosis not present

## 2022-08-10 ENCOUNTER — Other Ambulatory Visit: Payer: Self-pay | Admitting: Cardiology

## 2022-08-10 DIAGNOSIS — I4891 Unspecified atrial fibrillation: Secondary | ICD-10-CM

## 2022-08-11 NOTE — Telephone Encounter (Signed)
Prescription refill request received for warfarin Lov: 12/11/21 ([place])  Next INR check: 08/18/22 Warfarin tablet strength: 5mg   Appropriate dose and refill sent to requested pharmacy.

## 2022-08-12 ENCOUNTER — Other Ambulatory Visit: Payer: Self-pay | Admitting: "Endocrinology

## 2022-08-12 DIAGNOSIS — S233XXA Sprain of ligaments of thoracic spine, initial encounter: Secondary | ICD-10-CM | POA: Diagnosis not present

## 2022-08-12 DIAGNOSIS — S338XXA Sprain of other parts of lumbar spine and pelvis, initial encounter: Secondary | ICD-10-CM | POA: Diagnosis not present

## 2022-08-12 DIAGNOSIS — S134XXA Sprain of ligaments of cervical spine, initial encounter: Secondary | ICD-10-CM | POA: Diagnosis not present

## 2022-08-12 DIAGNOSIS — M9903 Segmental and somatic dysfunction of lumbar region: Secondary | ICD-10-CM | POA: Diagnosis not present

## 2022-08-12 DIAGNOSIS — E1159 Type 2 diabetes mellitus with other circulatory complications: Secondary | ICD-10-CM

## 2022-08-12 DIAGNOSIS — M9901 Segmental and somatic dysfunction of cervical region: Secondary | ICD-10-CM | POA: Diagnosis not present

## 2022-08-12 DIAGNOSIS — M9902 Segmental and somatic dysfunction of thoracic region: Secondary | ICD-10-CM | POA: Diagnosis not present

## 2022-08-18 ENCOUNTER — Ambulatory Visit: Payer: Medicare HMO | Attending: Cardiology | Admitting: *Deleted

## 2022-08-18 DIAGNOSIS — Z5181 Encounter for therapeutic drug level monitoring: Secondary | ICD-10-CM | POA: Diagnosis not present

## 2022-08-18 DIAGNOSIS — I4821 Permanent atrial fibrillation: Secondary | ICD-10-CM | POA: Diagnosis not present

## 2022-08-18 LAB — POCT INR: INR: 3.1 — AB (ref 2.0–3.0)

## 2022-08-18 NOTE — Patient Instructions (Signed)
Decrease warfarin to 1/2 tablet daily except 1 tablet on Sundays, Tuesdays and Thursdays. Recheck INR in 4 weeks.

## 2022-08-22 ENCOUNTER — Other Ambulatory Visit: Payer: Self-pay | Admitting: "Endocrinology

## 2022-08-22 DIAGNOSIS — E1159 Type 2 diabetes mellitus with other circulatory complications: Secondary | ICD-10-CM

## 2022-08-24 ENCOUNTER — Other Ambulatory Visit: Payer: Self-pay | Admitting: Cardiology

## 2022-08-24 DIAGNOSIS — I4891 Unspecified atrial fibrillation: Secondary | ICD-10-CM

## 2022-08-24 NOTE — Telephone Encounter (Signed)
Refill request for warfarin:  Last INR was 3.1 on 08/18/22 Next INR due 09/15/22 LOV was 12/11/21  Zandra Abts MD  Refill approved.

## 2022-08-25 DIAGNOSIS — F419 Anxiety disorder, unspecified: Secondary | ICD-10-CM | POA: Diagnosis not present

## 2022-08-25 DIAGNOSIS — F0390 Unspecified dementia without behavioral disturbance: Secondary | ICD-10-CM | POA: Diagnosis not present

## 2022-08-25 DIAGNOSIS — F339 Major depressive disorder, recurrent, unspecified: Secondary | ICD-10-CM | POA: Diagnosis not present

## 2022-08-26 ENCOUNTER — Encounter: Payer: Self-pay | Admitting: Urology

## 2022-08-26 ENCOUNTER — Ambulatory Visit (INDEPENDENT_AMBULATORY_CARE_PROVIDER_SITE_OTHER): Payer: Medicare HMO | Admitting: Urology

## 2022-08-26 VITALS — BP 147/75 | HR 65

## 2022-08-26 DIAGNOSIS — N200 Calculus of kidney: Secondary | ICD-10-CM | POA: Diagnosis not present

## 2022-08-26 DIAGNOSIS — Z87442 Personal history of urinary calculi: Secondary | ICD-10-CM

## 2022-08-26 LAB — URINALYSIS, ROUTINE W REFLEX MICROSCOPIC
Bilirubin, UA: NEGATIVE
Glucose, UA: NEGATIVE
Ketones, UA: NEGATIVE
Nitrite, UA: NEGATIVE
Specific Gravity, UA: 1.02 (ref 1.005–1.030)
Urobilinogen, Ur: 1 mg/dL (ref 0.2–1.0)
pH, UA: 5.5 (ref 5.0–7.5)

## 2022-08-26 LAB — MICROSCOPIC EXAMINATION: WBC, UA: >30 /hpf — AB (ref 0–5)

## 2022-08-26 NOTE — Progress Notes (Signed)
08/26/2022 2:30 PM   Rachel Odonnell Jul 29, 1956 [id number]  Referring provider: Rosalee Kaufman, PA-C [place],  [place] [address]  Nephrolithiasis   HPI: Ms Rachel Odonnell is a 67yo here for evaluation of nephrolithiasis. Her first stone event was 2008. She has had ESWL twice, 2 ureteroscopies, and she has passed 3 other calculi. Her last stone event was 06/2022 and she passed a 37mm calculus. CT 06/18/2022 showed bilateral 1-39mm calculi. No prior 24 hour urine. She had a recent serum calcium of 10.9.    PMH: Past Medical History:  Diagnosis Date   ADD (attention deficit disorder)    Diabetes mellitus without complication ([place])    Hypertension     Surgical History: Past Surgical History:  Procedure Laterality Date   ABDOMINAL HYSTERECTOMY     COLONOSCOPY  02/2022   EXCISIONAL HEMORRHOIDECTOMY     INCONTINENCE SURGERY     RIGHT/LEFT HEART CATH AND CORONARY ANGIOGRAPHY N/A 06/13/2021   Procedure: RIGHT/LEFT HEART CATH AND CORONARY ANGIOGRAPHY;  Surgeon: Martinique, Peter M, MD;  Location: [facility];  Service: Cardiovascular;  Laterality: N/A;    Home Medications:  Allergies as of 08/26/2022       Reactions   Azithromycin Hives   Codeine Nausea Only   Atomoxetine Nausea And Vomiting   Atorvastatin Other (See Comments)   Restless legs   Lisinopril Nausea And Vomiting        Medication List        Accurate as of August 26, 2022  2:30 PM. If you have any questions, ask your nurse or doctor.          Accu-Chek Softclix Lancets lancets Use as instructed to test blood glucose four times daily   B-D ULTRAFINE III SHORT PEN 31G X 8 MM Misc Generic drug: Insulin Pen Needle 1 each by Does not apply route as directed.   busPIRone 5 MG tablet Commonly known as: BUSPAR Take 5 mg by mouth 3 (three) times daily.   citalopram 40 MG tablet Commonly known as: CELEXA Take 40 mg by mouth every evening.   donepezil 5 MG tablet Commonly known as:  ARICEPT SMARTSIG:1 Tablet(s) By Mouth Every Evening   estradiol 2 MG tablet Commonly known as: ESTRACE Take 2 mg by mouth every evening.   Euthyrox 50 MCG tablet Generic drug: levothyroxine Take 1 tablet (50 mcg total) by mouth daily before breakfast.   fenofibrate 145 MG tablet Commonly known as: TRICOR Take 145 mg by mouth every evening.   Fish Oil 1000 MG Caps Take 1,000 mg by mouth 2 (two) times daily. Morning and evening   fluorometholone 0.1 % ophthalmic suspension Commonly known as: FML Place 1 drop into both eyes 3 (three) times daily as needed (allergy eyes.).   FreeStyle Libre 2 Reader Kerrin Mo As directed   [employer] 2 Sensor Misc Change sensor every 14 days. Use to check glucose continuously   gabapentin 100 MG capsule Commonly known as: Neurontin Take 1 capsule (100 mg total) by mouth 3 (three) times daily. What changed: when to take this   glipiZIDE 5 MG tablet Commonly known as: GLUCOTROL Take 1 tablet (5 mg total) by mouth daily before breakfast.   glucose blood test strip 1 each by Other route as needed. Use as instructed to test blood glucose four times daily   isosorbide mononitrate 60 MG 24 hr tablet Commonly known as: IMDUR Take 1 tablet (60 mg total) by mouth daily.   levocetirizine 5  MG tablet Commonly known as: XYZAL Take 5 mg by mouth every evening.   metFORMIN 500 MG tablet Commonly known as: GLUCOPHAGE Take 2 tablets (1,000 mg total) by mouth 2 (two) times daily with a meal.   modafinil 100 MG tablet Commonly known as: PROVIGIL Take 1 tablet by mouth once daily   montelukast 10 MG tablet Commonly known as: SINGULAIR Take 10 mg by mouth at bedtime.   MULTIVITAMIN ADULT PO Take 1 tablet by mouth daily.   omeprazole 40 MG capsule Commonly known as: PRILOSEC Take 40 mg by mouth every evening.   rosuvastatin 10 MG tablet Commonly known as: CRESTOR Take 10 mg by mouth every evening.   Tyler [employer] FlexTouch 200 UNIT/ML FlexTouch  Pen Generic drug: insulin degludec Inject 80 Units into the skin at bedtime.   Vitamin D3 125 MCG (5000 UT) Caps Take 1 capsule (5,000 Units total) by mouth daily.   warfarin 5 MG tablet Commonly known as: COUMADIN Take as directed by the anticoagulation clinic. If you are unsure how to take this medication, talk to your nurse or doctor. Original instructions: TAKE 1/2 TO 1 (ONE-HALF TO ONE) TABLET BY MOUTH ONCE DAILY AS  DIRECTED  BY  THE  COUMADIN  CLINIC        Allergies:  Allergies  Allergen Reactions   Azithromycin Hives   Codeine Nausea Only   Atomoxetine Nausea And Vomiting   Atorvastatin Other (See Comments)    Restless legs   Lisinopril Nausea And Vomiting    Family History: Family History  Problem Relation Age of Onset   Cancer Mother    Diabetes Father    Heart Problems Father    COPD Father    Emphysema Father    Heart block Father    Congestive Heart Failure Father     Social History:  reports that she quit smoking about 28 years ago. Her smoking use included cigarettes. She smoked an average of 2 packs per day. She has never used smokeless tobacco. She reports that she does not drink alcohol and does not use drugs.  ROS: All other review of systems were reviewed and are negative except what is noted above in HPI  Physical Exam: BP (!) 147/75   Pulse 65   Constitutional:  Alert and oriented, No acute distress. HEENT: [place] AT, moist mucus membranes.  Trachea midline, no masses. Cardiovascular: No clubbing, cyanosis, or edema. Respiratory: Normal respiratory effort, no increased work of breathing. GI: Abdomen is soft, nontender, nondistended, no abdominal masses GU: No CVA tenderness.  Lymph: No cervical or inguinal lymphadenopathy. Skin: No rashes, bruises or suspicious lesions. Neurologic: Grossly intact, no focal deficits, moving all 4 extremities. Psychiatric: Normal mood and affect.  Laboratory Data: Lab Results  Component Value Date   WBC  7.3 06/11/2021   HGB 12.6 06/13/2021   HGB 12.6 06/13/2021   HCT 37.0 06/13/2021   HCT 37.0 06/13/2021   MCV 98.1 06/11/2021   PLT 267 06/11/2021    Lab Results  Component Value Date   CREATININE 0.90 07/28/2022    No results found for: "PSA"  No results found for: "TESTOSTERONE"  Lab Results  Component Value Date   HGBA1C 9.0 (A) 04/02/2022    Urinalysis No results found for: "COLORURINE", "APPEARANCEUR", "LABSPEC", "PHURINE", "GLUCOSEU", "HGBUR", "BILIRUBINUR", "KETONESUR", "PROTEINUR", "UROBILINOGEN", "NITRITE", "LEUKOCYTESUR"  No results found for: "LABMICR", "WBCUA", "RBCUA", "LABEPIT", "MUCUS", "BACTERIA"  Pertinent Imaging: Ct 08/18/2021: Images reviewed and discussed with the patient  Results for orders placed during  the hospital encounter of 09/29/11  DG Abd 1 View  Narrative *RADIOLOGY REPORT*  Clinical Data: Follow up left renal calculus  ABDOMEN - 1 VIEW  Comparison: None  Findings: Calcification projects over expected position of left renal pelvis, 1.9 x 1.3 cm. No additional urinary tract calcification identified. Rounded calcifications in pelvis bilaterally likely reflect pelvic phleboliths. Osseous structures normal. Increased stool in colon.  IMPRESSION: 1.9 x 1.3 cm diameter calcification projects over the expected position of left renal pelvis.  Original Report Authenticated By: Lollie Marrow, M.D.  No results found for this or any previous visit.  No results found for this or any previous visit.  No results found for this or any previous visit.  No results found for this or any previous visit.  No valid procedures specified. No results found for this or any previous visit.  No results found for this or any previous visit.   Assessment & Plan:    1. History of kidney stones -BMP, uric acid, PTH -24 hour urine -folowup in 4-6 weeks - Urinalysis, Routine w reflex microscopic   No follow-ups on file.  Wilkie Aye,  MD  [facility] Urology [place]

## 2022-08-26 NOTE — Patient Instructions (Signed)

## 2022-08-27 LAB — BASIC METABOLIC PANEL
BUN/Creatinine Ratio: 12 (ref 12–28)
BUN: 11 mg/dL (ref 8–27)
CO2: 22 mmol/L (ref 20–29)
Calcium: 10.1 mg/dL (ref 8.7–10.3)
Chloride: 99 mmol/L (ref 96–106)
Creatinine, Ser: 0.9 mg/dL (ref 0.57–1.00)
Glucose: 242 mg/dL — ABNORMAL HIGH (ref 70–99)
Potassium: 4.8 mmol/L (ref 3.5–5.2)
Sodium: 137 mmol/L (ref 134–144)
eGFR: 70 mL/min/{1.73_m2} (ref >59)

## 2022-08-27 LAB — PTH, INTACT AND CALCIUM: PTH: 34 pg/mL (ref 15–65)

## 2022-08-27 LAB — URIC ACID: Uric Acid: 4.1 mg/dL (ref 3.0–7.2)

## 2022-08-29 LAB — URINE CULTURE

## 2022-08-31 ENCOUNTER — Other Ambulatory Visit: Payer: Self-pay

## 2022-08-31 MED ORDER — NITROFURANTOIN MONOHYD MACRO 100 MG PO CAPS: 100.0000 mg | ORAL_CAPSULE | Freq: Two times a day (BID) | ORAL | 0 refills | Status: DC

## 2022-08-31 NOTE — Progress Notes (Unsigned)
Verbal from Dr. Alyson Ingles to send in Brent 100mg  BID for 7 days.  Rx sent to pharmacy and patient aware via voicemail.

## 2022-09-01 ENCOUNTER — Ambulatory Visit (INDEPENDENT_AMBULATORY_CARE_PROVIDER_SITE_OTHER): Payer: Medicare HMO | Admitting: Nurse Practitioner

## 2022-09-01 ENCOUNTER — Encounter: Payer: Self-pay | Admitting: Nurse Practitioner

## 2022-09-01 VITALS — BP 109/68 | HR 62 | Ht 64.0 in | Wt 229.0 lb

## 2022-09-01 DIAGNOSIS — E559 Vitamin D deficiency, unspecified: Secondary | ICD-10-CM | POA: Diagnosis not present

## 2022-09-01 DIAGNOSIS — K760 Fatty (change of) liver, not elsewhere classified: Secondary | ICD-10-CM

## 2022-09-01 DIAGNOSIS — I10 Essential (primary) hypertension: Secondary | ICD-10-CM | POA: Diagnosis not present

## 2022-09-01 DIAGNOSIS — E782 Mixed hyperlipidemia: Secondary | ICD-10-CM

## 2022-09-01 DIAGNOSIS — E1159 Type 2 diabetes mellitus with other circulatory complications: Secondary | ICD-10-CM

## 2022-09-01 DIAGNOSIS — E039 Hypothyroidism, unspecified: Secondary | ICD-10-CM | POA: Diagnosis not present

## 2022-09-01 LAB — POCT GLYCOSYLATED HEMOGLOBIN (HGB A1C): Hemoglobin A1C: 9.9 % — AB (ref 4.0–5.6)

## 2022-09-01 NOTE — Progress Notes (Signed)
09/01/2022, 2:18 PM   Endocrinology follow-up note  Subjective:    Patient ID: Rachel Odonnell, female    DOB: 02-18-1956.  Rachel Odonnell is being seen in follow-up after she was seen in consultation for  management of currently uncontrolled symptomatic diabetes requested by  Rosalee Kaufman, PA-C.   Past Medical History:  Diagnosis Date   ADD (attention deficit disorder)    Diabetes mellitus without complication ([place])    Hypertension     Past Surgical History:  Procedure Laterality Date   ABDOMINAL HYSTERECTOMY     COLONOSCOPY  02/2022   EXCISIONAL HEMORRHOIDECTOMY     INCONTINENCE SURGERY     RIGHT/LEFT HEART CATH AND CORONARY ANGIOGRAPHY N/A 06/13/2021   Procedure: RIGHT/LEFT HEART CATH AND CORONARY ANGIOGRAPHY;  Surgeon: Martinique, Peter M, MD;  Location: [facility];  Service: Cardiovascular;  Laterality: N/A;    Social History   Socioeconomic History   Marital status: Married    Spouse name: Milbert Coulter   Number of children: Not on file   Years of education: Not on file   Highest education level: Not on file  Occupational History   Not on file  Tobacco Use   Smoking status: Former    Packs/day: 2.00    Types: Cigarettes    Quit date: 08/17/1994    Years since quitting: 28.0   Smokeless tobacco: Never  Vaping Use   Vaping Use: Never used  Substance and Sexual Activity   Alcohol use: No   Drug use: No   Sexual activity: Not on file  Other Topics Concern   Not on file  Social History Narrative   Lives at home with husband, Milbert Coulter   Former smoker Smoked 2 ppd x 25 yrs; Quit in 1996   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, academic  retired Magazine features editor, still works part-time at the [employer], used to work Education officer, community   Now on fixed income from her [place] and pension   Social Determinants of [place] Strain: [place]   (10/10/2021)   Overall Financial Resource Strain (CARDIA)    Difficulty of Paying Living Expenses: Not hard at [place]: No [place] (10/10/2021)   Hunger Vital Sign    Worried About [place] in the Last Year: Never true    [place] in the Last Year: Never true  Transportation Needs: No Transportation Needs (10/10/2021)   PRAPARE - Hydrologist (Medical): No    Lack of Transportation (Non-Medical): No  Physical Activity: Unknown (04/18/2021)   Exercise Vital Sign    Days of Exercise per Week: 1 day    Minutes of Exercise per Session: Not on file  Stress: No Stress Concern Present (10/10/2021)   [place]    Feeling of Stress : Only a little  Social Connections: Socially Integrated (10/10/2021)   Social Connection and Isolation Panel [NHANES]    Frequency of Communication with Friends and Family: More than three times a week  Frequency of Social Gatherings with Friends and Family: More than three times a week    Attends Religious Services: More than 4 times per year    Active Member of [employer] or Organizations: Yes    Attends Engineer, structural: More than 4 times per year    Marital Status: Married    Family History  Problem Relation Age of Onset   Cancer Mother    Diabetes Father    Heart Problems Father    COPD Father    Emphysema Father    Heart block Father    Congestive Heart Failure Father     Outpatient Encounter Medications as of 09/01/2022  Medication Sig   Accu-Chek Softclix Lancets lancets Use as instructed to test blood glucose four times daily   busPIRone (BUSPAR) 5 MG tablet Take 5 mg by mouth 3 (three) times daily.   Cholecalciferol (VITAMIN D3) 125 MCG (5000 UT) CAPS Take 1 capsule (5,000 Units total) by mouth daily.   citalopram (CELEXA) 40 MG tablet Take 40 mg by mouth every evening.   Continuous Blood Gluc Receiver  (FREESTYLE LIBRE 2 READER) DEVI As directed   Continuous Blood Gluc Sensor (FREESTYLE LIBRE 2 SENSOR) MISC Change sensor every 14 days. Use to check glucose continuously   donepezil (ARICEPT) 5 MG tablet SMARTSIG:1 Tablet(s) By Mouth Every Evening   estradiol (ESTRACE) 2 MG tablet Take 2 mg by mouth every evening.   EUTHYROX 50 MCG tablet Take 1 tablet (50 mcg total) by mouth daily before breakfast.   fenofibrate (TRICOR) 145 MG tablet Take 145 mg by mouth every evening.   fluorometholone (FML) 0.1 % ophthalmic suspension Place 1 drop into both eyes 3 (three) times daily as needed (allergy eyes.).   gabapentin (NEURONTIN) 100 MG capsule Take 1 capsule (100 mg total) by mouth 3 (three) times daily. (Patient taking differently: Take 100 mg by mouth at bedtime.)   glipiZIDE (GLUCOTROL) 5 MG tablet Take 1 tablet (5 mg total) by mouth daily before breakfast.   glucose blood test strip 1 each by Other route as needed. Use as instructed to test blood glucose four times daily   insulin degludec (TRESIBA FLEXTOUCH) 200 UNIT/ML FlexTouch Pen Inject 80 Units into the skin at bedtime.   Insulin Pen Needle (B-D ULTRAFINE III SHORT PEN) 31G X 8 MM MISC 1 each by Does not apply route as directed.   isosorbide mononitrate (IMDUR) 60 MG 24 hr tablet Take 1 tablet (60 mg total) by mouth daily.   levocetirizine (XYZAL) 5 MG tablet Take 5 mg by mouth every evening.   metFORMIN (GLUCOPHAGE) 500 MG tablet Take 2 tablets (1,000 mg total) by mouth 2 (two) times daily with a meal.   modafinil (PROVIGIL) 100 MG tablet Take 1 tablet by mouth once daily   montelukast (SINGULAIR) 10 MG tablet Take 10 mg by mouth at bedtime.   Multiple Vitamin (MULTIVITAMIN ADULT PO) Take 1 tablet by mouth daily.   Omega-3 Fatty Acids (FISH OIL) 1000 MG CAPS Take 1,000 mg by mouth 2 (two) times daily. Morning and evening   omeprazole (PRILOSEC) 40 MG capsule Take 40 mg by mouth every evening.   rosuvastatin (CRESTOR) 10 MG tablet Take 10 mg  by mouth every evening.   warfarin (COUMADIN) 5 MG tablet TAKE 1/2 TO 1 (ONE-HALF TO ONE) TABLET BY MOUTH ONCE DAILY AS  DIRECTED  BY  THE  COUMADIN  CLINIC   nitrofurantoin, macrocrystal-monohydrate, (MACROBID) 100 MG capsule Take 1 capsule (100 mg  total) by mouth every 12 (twelve) hours. (Patient not taking: Reported on 09/01/2022)   No facility-administered encounter medications on file as of 09/01/2022.    ALLERGIES: Allergies  Allergen Reactions   Azithromycin Hives   Codeine Nausea Only   Atomoxetine Nausea And Vomiting   Atorvastatin Other (See Comments)    Restless legs   Lisinopril Nausea And Vomiting    VACCINATION STATUS: Immunization History  Administered Date(s) Administered   Fluad Quad(high Dose 65+) 05/14/2021   Moderna SARS-COV2 Booster Vaccination 06/13/2020   Moderna Sars-Covid-2 Vaccination 09/06/2019, 10/04/2019   Pneumococcal Polysaccharide-23 04/22/2017   Pneumococcal-Unspecified 05/31/2020   Tdap 04/22/2017   Zoster Recombinat (Shingrix) 11/30/2017, 02/20/2018    Diabetes She presents for her follow-up diabetic visit. She has type 2 diabetes mellitus. Onset time: Patient was diagnosed at approximate age of 49 years. Her disease course has been fluctuating. There are no hypoglycemic associated symptoms. Pertinent negatives for hypoglycemia include no confusion, headaches, pallor or seizures. Pertinent negatives for diabetes include no blurred vision, no chest pain, no fatigue, no polydipsia, no polyphagia and no polyuria. There are no hypoglycemic complications. Symptoms are improving. Diabetic complications include heart disease and peripheral neuropathy. Risk factors for coronary artery disease include dyslipidemia, diabetes mellitus, family history, obesity, post-menopausal, sedentary lifestyle, tobacco exposure and hypertension. Current diabetic treatment includes insulin injections and oral agent (dual therapy) (.). Her weight is increasing steadily. She is  following a generally unhealthy diet. When asked about meal planning, she reported none. She never participates in exercise. Her home blood glucose trend is fluctuating dramatically. Her breakfast blood glucose range is generally >200 mg/dl. Her lunch blood glucose range is generally >200 mg/dl. Her dinner blood glucose range is generally >200 mg/dl. Her bedtime blood glucose range is generally >200 mg/dl. Her overall blood glucose range is >200 mg/dl. (She presents today with her CGM showing inconsistent glucose monitoring with gross hyperglycemia overall.  Her POCT A1c today is 9.9%, increasing from last visit of 9%.  She notes she has fallen off the bandwagon lately.  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  Analysis of her CGM shows TIR 20%, TAR 80% (40% in the tier 2 hyperglycemia range), TBR 0%.  She denies any hypoglycemia.  She reports she did try the WFPB diet but did not like it. ) An ACE inhibitor/angiotensin II receptor blocker is not being taken. Eye exam is current.  Hyperlipidemia This is a chronic problem. The current episode started more than 1 year ago. The problem is uncontrolled. Exacerbating diseases include diabetes, hypothyroidism and obesity. Pertinent negatives include no chest pain, myalgias or shortness of breath. Current antihyperlipidemic treatment includes statins, fibric acid derivatives and bile acid sequestrants. Risk factors for coronary artery disease include family history, dyslipidemia, obesity, a sedentary lifestyle, post-menopausal, hypertension and diabetes mellitus.  Hypertension This is a chronic problem. The current episode started more than 1 year ago. Pertinent negatives include no blurred vision, chest pain, headaches, palpitations or shortness of breath. Risk factors for coronary artery disease include dyslipidemia, diabetes mellitus, family history, obesity, post-menopausal state, smoking/tobacco exposure and sedentary  lifestyle. Past treatments include direct vasodilators. Hypertensive end-organ damage includes CAD/MI.    Review of systems  Constitutional: +steadily increasing body weight,  current Body mass index is 39.31 kg/m. , no fatigue, no subjective hyperthermia, no subjective hypothermia Eyes: no blurry vision, no xerophthalmia ENT: no sore throat, no nodules palpated in throat, no dysphagia/odynophagia, no hoarseness Cardiovascular: no chest  pain, no shortness of breath, no palpitations, no leg swelling Respiratory: no cough, no shortness of breath Gastrointestinal: no nausea/vomiting/diarrhea Musculoskeletal: no muscle/joint aches Skin: no rashes, no hyperemia Neurological: no tremors, no numbness, no tingling, no dizziness Psychiatric: no depression, no anxiety   Objective:    BP 109/68 (BP Location: Left Arm, Patient Position: Sitting, Cuff Size: Large)   Pulse 62   Ht 5\' 4"  (1.626 m)   Wt 229 lb (103.9 kg)   BMI 39.31 kg/m   Wt Readings from Last 3 Encounters:  09/01/22 229 lb (103.9 kg)  04/02/22 223 lb 9.6 oz (101.4 kg)  03/23/22 218 lb (98.9 kg)    BP Readings from Last 3 Encounters:  09/01/22 109/68  08/26/22 (!) 147/75  04/02/22 (!) 108/56     Physical Exam- Limited  Constitutional:  Body mass index is 39.31 kg/m. , not in acute distress, normal state of mind Eyes:  EOMI, no exophthalmos Musculoskeletal: no gross deformities, strength intact in all four extremities, no gross restriction of joint movements Skin:  no rashes, no hyperemia Neurological: no tremor with outstretched hands   CMP ( most recent) CMP     Component Value Date/Time   NA 137 08/26/2022 1455   K 4.8 08/26/2022 1455   CL 99 08/26/2022 1455   CO2 22 08/26/2022 1455   GLUCOSE 242 (H) 08/26/2022 1455   GLUCOSE 338 (H) 06/11/2021 1331   BUN 11 08/26/2022 1455   CREATININE 0.90 08/26/2022 1455   CALCIUM 10.1 08/26/2022 1455   PROT 7.6 07/28/2022 1509   ALBUMIN 4.6 07/28/2022 1509   AST  41 (H) 07/28/2022 1509   ALT 28 07/28/2022 1509   ALKPHOS 92 07/28/2022 1509   BILITOT 0.4 07/28/2022 1509   GFRNONAA >60 06/11/2021 1331   GFRAA >90 07/09/2012 1736    Recent Results (from the past 2160 hour(s))  POCT INR     Status: Abnormal   Collection Time: 06/08/22  2:19 PM  Result Value Ref Range   INR 3.3 (A) 2.0 - 3.0   POC INR    POCT INR     Status: Normal   Collection Time: 06/22/22  2:09 PM  Result Value Ref Range   INR 2.5 2.0 - 3.0   POC INR    POCT INR     Status: Normal   Collection Time: 07/20/22  2:32 PM  Result Value Ref Range   INR 2.9 2.0 - 3.0   POC INR    Comprehensive metabolic panel     Status: Abnormal   Collection Time: 07/28/22  3:09 PM  Result Value Ref Range   Glucose 268 (H) 70 - 99 mg/dL   BUN 18 8 - 27 mg/dL   Creatinine, Ser 1.610.90 0.57 - 1.00 mg/dL   eGFR 70 >09>59 UE/AVW/0.98mL/min/1.73   BUN/Creatinine Ratio 20 12 - 28   Sodium 141 134 - 144 mmol/L   Potassium 4.7 3.5 - 5.2 mmol/L   Chloride 97 96 - 106 mmol/L   CO2 21 20 - 29 mmol/L   Calcium 10.9 (H) 8.7 - 10.3 mg/dL   Total Protein 7.6 6.0 - 8.5 g/dL   Albumin 4.6 3.9 - 4.9 g/dL   Globulin, Total 3.0 1.5 - 4.5 g/dL   Albumin/Globulin Ratio 1.5 1.2 - 2.2   Bilirubin Total 0.4 0.0 - 1.2 mg/dL   Alkaline Phosphatase 92 44 - 121 IU/L   AST 41 (H) 0 - 40 IU/L   ALT 28 0 - 32 IU/L  Lipid panel     Status: Abnormal   Collection Time: 07/28/22  3:09 PM  Result Value Ref Range   Cholesterol, Total 219 (H) 100 - 199 mg/dL   Triglycerides 454359 (H) 0 - 149 mg/dL   HDL 36 (L) >09>39 mg/dL   VLDL Cholesterol Cal 63 (H) 5 - 40 mg/dL   LDL Chol Calc (NIH) 811120 (H) 0 - 99 mg/dL   Chol/HDL Ratio 6.1 (H) 0.0 - 4.4 ratio    Comment:                                   T. Chol/HDL Ratio                                             Men  Women                               1/2 Avg.Risk  3.4    3.3                                   Avg.Risk  5.0    4.4                                2X Avg.Risk  9.6    7.1                                 3X Avg.Risk 23.4   11.0   TSH     Status: None   Collection Time: 07/28/22  3:09 PM  Result Value Ref Range   TSH 2.920 0.450 - 4.500 uIU/mL  T4, free     Status: None   Collection Time: 07/28/22  3:09 PM  Result Value Ref Range   Free T4 1.38 0.82 - 1.77 ng/dL  POCT INR     Status: Abnormal   Collection Time: 08/18/22  1:36 PM  Result Value Ref Range   INR 3.1 (A) 2.0 - 3.0   POC INR    Urinalysis, Routine w reflex microscopic     Status: Abnormal   Collection Time: 08/26/22  2:07 PM  Result Value Ref Range   Specific Gravity, UA 1.020 1.005 - 1.030   pH, UA 5.5 5.0 - 7.5   Color, UA Yellow Yellow   Appearance Ur Cloudy (A) Clear   Leukocytes,UA 3+ (A) Negative   Protein,UA 1+ (A) Negative/Trace   Glucose, UA Negative Negative   Ketones, UA Negative Negative   RBC, UA Trace (A) Negative   Bilirubin, UA Negative Negative   Urobilinogen, Ur 1.0 0.2 - 1.0 mg/dL   Nitrite, UA Negative Negative   Microscopic Examination See below:     Comment: Microscopic was indicated and was performed.  Microscopic Examination     Status: Abnormal   Collection Time: 08/26/22  2:07 PM   Urine  Result Value Ref Range   WBC, UA >30 (A) 0 - 5 /hpf   RBC, Urine 3-10 (A) 0 - 2 /hpf   Epithelial Cells (non renal) 0-10 0 - 10 /  hpf   Bacteria, UA Moderate (A) None seen/Few  Uric acid     Status: None   Collection Time: 08/26/22  2:55 PM  Result Value Ref Range   Uric Acid 4.1 3.0 - 7.2 mg/dL    Comment:            Therapeutic target for gout patients: <7.8  Basic metabolic panel     Status: Abnormal   Collection Time: 08/26/22  2:55 PM  Result Value Ref Range   Glucose 242 (H) 70 - 99 mg/dL   BUN 11 8 - 27 mg/dL   Creatinine, Ser 0.90 0.57 - 1.00 mg/dL   eGFR 70 >59 mL/min/1.73   BUN/Creatinine Ratio 12 12 - 28   Sodium 137 134 - 144 mmol/L   Potassium 4.8 3.5 - 5.2 mmol/L   Chloride 99 96 - 106 mmol/L   CO2 22 20 - 29 mmol/L   Calcium 10.1 8.7 - 10.3 mg/dL  PTH,  Intact and Calcium     Status: None   Collection Time: 08/26/22  2:55 PM  Result Value Ref Range   PTH 34 15 - 65 pg/mL   PTH Interp Comment     Comment: Interpretation                 Intact PTH    Calcium                                 (pg/mL)      (mg/dL) Normal                          15 - 65     8.6 - 10.2 Primary Hyperparathyroidism         >65          >10.2 Secondary Hyperparathyroidism       >65          <10.2 Non-Parathyroid Hypercalcemia       <65          >10.2 Hypoparathyroidism                  <15          < 8.6 Non-Parathyroid Hypocalcemia    15 - 65          < 8.6   Urine Culture     Status: Abnormal   Collection Time: 08/26/22  3:23 PM   Specimen: Urine   UR  Result Value Ref Range   Urine Culture, Routine Final report (A)    Organism ID, Bacteria Escherichia coli (A)     Comment: Cefazolin <=4 ug/mL Cefazolin with an MIC <=16 predicts susceptibility to the oral agents cefaclor, cefdinir, cefpodoxime, cefprozil, cefuroxime, cephalexin, and loracarbef when used for therapy of uncomplicated urinary tract infections due to E. coli, Klebsiella pneumoniae, and Proteus mirabilis. Greater than 100,000 colony forming units per mL    Antimicrobial Susceptibility Comment     Comment:       ** S = Susceptible; I = Intermediate; R = Resistant **                    P = Positive; N = Negative             MICS are expressed in micrograms per mL    Antibiotic  RSLT#1    RSLT#2    RSLT#3    RSLT#4 Amoxicillin/Clavulanic Acid    S Ampicillin                     S Cefepime                       S Ceftriaxone                    S Cefuroxime                     S Ciprofloxacin                  S Ertapenem                      S Gentamicin                     S Imipenem                       S Levofloxacin                   S Meropenem                      S Nitrofurantoin                 S Piperacillin/Tazobactam        S Tetracycline                    S Tobramycin                     S Trimethoprim/Sulfa             S   HgB A1c     Status: Abnormal   Collection Time: 09/01/22  2:01 PM  Result Value Ref Range   Hemoglobin A1C 9.9 (A) 4.0 - 5.6 %   HbA1c POC (<> result, manual entry)     HbA1c, POC (prediabetic range)     HbA1c, POC (controlled diabetic range)       Assessment & Plan:   1) DM type 2 causing vascular disease (HCC)  - KALYNN DECLERCQ has currently uncontrolled symptomatic type 2 DM since  67 years of age.  She presents today with her CGM showing inconsistent glucose monitoring with gross hyperglycemia overall.  Her POCT A1c today is 9.9%, increasing from last visit of 9%.  She notes she has fallen off the bandwagon lately.  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  Analysis of her CGM shows TIR 20%, TAR 80% (40% in the tier 2 hyperglycemia range), TBR 0%.  She denies any hypoglycemia.  She reports she did try the WFPB diet but did not like it.   Recent labs reviewed.  - I had a long discussion with her about the progressive nature of diabetes and the pathology behind its complications. -her diabetes is complicated by coronary artery disease, peripheral neuropathy, obesity, sedentary life, nonalcoholic fatty liver disease and she remains at extremely  high risk for more acute and chronic complications which include CAD, CVA, CKD, retinopathy, and neuropathy. These are all discussed in detail with her.  - I discussed all available options of managing her diabetes including de-escalation of medications.  Considering  her associated comorbidities including sleep apnea requiring CPAP, nonalcoholic fatty liver disease, hypertension, hyperlipidemia, she is a perfect candidate for lifestyle medicine.    I have counseled her on diet  and weight management  by adopting a Whole Food , Plant Predominant  ( WFPP) nutrition as recommended by [employer] of Lifestyle Medicine. Patient is  encouraged to switch to  unprocessed or minimally processed  complex starch, adequate protein intake (mainly plant source), minimal liquid fat ( mainly vegetable oils), plenty of fruits, and vegetables. -  she is advised to stick to a routine mealtimes to eat 3 complete meals a day and snack only when necessary ( to snack only to correct hypoglycemia BG <70 day time or <100 at night).   - she acknowledges that there is a room for improvement in her food and drink choices. - Suggestion is made for her to avoid simple carbohydrates  from her diet including Cakes, Sweet Desserts, Ice Cream, Soda (diet and regular), Sweet Tea, Candies, Chips, Cookies, Store Bought Juices, Alcohol , Artificial Sweeteners,  Coffee Creamer, and "Sugar-free" Products, Lemonade. This will help patient to have more stable blood glucose profile and potentially avoid unintended weight gain.  The following Lifestyle Medicine recommendations according to American College of Lifestyle Medicine  [facility]) were discussed and and offered to patient and she  agrees to start the journey:  A. Whole Foods, Plant-Based Nutrition comprising of fruits and vegetables, plant-based proteins, whole-grain carbohydrates was discussed in detail with the patient.   A list for source of those nutrients were also provided to the patient.  Patient will use only water or unsweetened tea for hydration. B.  The need to stay away from risky substances including alcohol, smoking; obtaining 7 to 9 hours of restorative sleep, at least 150 minutes of moderate intensity exercise weekly, the importance of healthy social connections,  and stress management techniques were discussed. C.  A full color page of  Calorie density of various food groups per pound showing examples of each food groups was provided to the patient.  - she has been scheduled with Norm Salt, RDN, CDE for individualized diabetes education.  - I have approached her with the following plan to  manage  her diabetes and patient agrees:   She is advised to continue her Tresiba 80 units SQ nightly, Metformin 1000 mg po twice daily, and increase her Glipizide to 5 mg po twice daily.    -She is encouraged to be more consistent with glucose monitoring, to scan before each meal and before bed.  she is encouraged to call clinic for blood glucose levels less than 70 or above 200 mg /dl. - she has been on GLP1 therapy in the past and tolerated it well but insurance did not provide optimal coverage.  -She did not tolerate Jardiance in a prior attempt.  - Specific targets for  A1c;  LDL, HDL,  and Triglycerides were discussed with the patient.  2) Blood Pressure /Hypertension:   -Her blood pressure is controlled to target.  she is advised to continue her current medications including Imdur 60 mg p.o. daily with breakfast .  3) Lipids/Hyperlipidemia:    Her most recent lipid panel from 07/28/22 shows uncontrolled LDL of 120 and significantly elevated triglycerides of 359.  She is advised to continue on her current triple agent therapy including fenofibrate 145 mg p.o. daily, omega-3 fatty acids 1000 mg p.o. twice daily, and Crestor 10 mg p.o. daily at bedtime.  She has a  chance to simplify this treatment with proper engagement in lifestyle medicine.  She is hesitant to engage with lifestyle medicine for now.  Side effects and precautions discussed with her.  4)  Weight/Diet:   Her Body mass index is 39.31 kg/m.  -   clearly complicating her diabetes care.   she is  a candidate for weight loss. I discussed with her the fact that loss of 5 - 10% of her  current body weight will have the most impact on her diabetes management.  The above detailed  ACLM recommendations for nutrition, exercise, sleep, social life, avoidance of risky substances, the need for restorative sleep   information will also detailed on discharge instructions.  5) Hypothyroidism:  The circumstances of her diagnosis are not  available to review.  Her previsit thyroid function tests are consistent with appropriate hormone replacement.  She is advised to continue Levothyroxine 50 mcg p.o. daily before breakfast.    - We discussed about the correct intake of her thyroid hormone, on empty stomach at fasting, with water, separated by at least 30 minutes from breakfast and other medications,  and separated by more than 4 hours from calcium, iron, multivitamins, acid reflux medications (PPIs). -Patient is made aware of the fact that thyroid hormone replacement is needed for life, dose to be adjusted by periodic monitoring of thyroid function tests.  6) Chronic Care/Health Maintenance: -she is on Statin medications and  is encouraged to initiate and continue to follow up with Ophthalmology, Dentist,  Podiatrist at least yearly or according to recommendations, and advised to   stay away from smoking. I have recommended yearly flu vaccine and pneumonia vaccine at least every 5 years; moderate intensity exercise for up to 150 minutes weekly; and  sleep for 7- 9 hours a day.  -She will need to have vitamin D supplement with vitamin D3 5000 units daily x90 days.  - she is advised to maintain close follow up with Royann Shivers, PA-C for primary care needs, as well as her other providers for optimal and coordinated care.     I spent 50 minutes in the care of the patient today including review of labs from CMP, Lipids, Thyroid Function, Hematology (current and previous including abstractions from other facilities); face-to-face time discussing  her blood glucose readings/logs, discussing hypoglycemia and hyperglycemia episodes and symptoms, medications doses, her options of short and long term treatment based on the latest standards of care / guidelines;  discussion about incorporating lifestyle medicine;  and documenting the encounter. Risk reduction counseling performed per USPSTF guidelines to reduce obesity and cardiovascular  risk factors.     Please refer to Patient Instructions for Blood Glucose Monitoring and Insulin/Medications Dosing Guide"  in media tab for additional information. Please  also refer to " Patient Self Inventory" in the Media  tab for reviewed elements of pertinent patient history.  Shyna Duignan Kassel participated in the discussions, expressed understanding, and voiced agreement with the above plans.  All questions were answered to her satisfaction. she is encouraged to contact clinic should she have any questions or concerns prior to her return visit.    Follow up plan: - Return in about 3 months (around 12/01/2022) for Diabetes F/U with A1c in office, No previsit labs, Bring meter and logs.  Ronny Bacon, [facility] [address] [place], [place] [address] Phone: [phone] Fax: [phone]  09/01/2022, 2:18 PM

## 2022-09-02 DIAGNOSIS — M9901 Segmental and somatic dysfunction of cervical region: Secondary | ICD-10-CM | POA: Diagnosis not present

## 2022-09-02 DIAGNOSIS — S338XXA Sprain of other parts of lumbar spine and pelvis, initial encounter: Secondary | ICD-10-CM | POA: Diagnosis not present

## 2022-09-02 DIAGNOSIS — S134XXA Sprain of ligaments of cervical spine, initial encounter: Secondary | ICD-10-CM | POA: Diagnosis not present

## 2022-09-02 DIAGNOSIS — M9902 Segmental and somatic dysfunction of thoracic region: Secondary | ICD-10-CM | POA: Diagnosis not present

## 2022-09-02 DIAGNOSIS — M9903 Segmental and somatic dysfunction of lumbar region: Secondary | ICD-10-CM | POA: Diagnosis not present

## 2022-09-02 DIAGNOSIS — S233XXA Sprain of ligaments of thoracic spine, initial encounter: Secondary | ICD-10-CM | POA: Diagnosis not present

## 2022-09-08 ENCOUNTER — Ambulatory Visit: Payer: Medicare HMO | Admitting: Pulmonary Disease

## 2022-09-08 ENCOUNTER — Telehealth: Payer: Self-pay

## 2022-09-08 NOTE — Telephone Encounter (Signed)
Made patient aware that her BMP was normal. Patient voiced that she was not sure why her BMP was being check. I made the patient aware that the BMP checks her level of electrolytes and how well her kidney are functioning. Patient voiced that the MD told her that if her BMP was normal then she may have to do a 24 hour urine because her levels were high and should never be that high, but patient is confused on whether she should start doing her 24 hour urine. I made patient aware that I will send a message to Dr. Alyson Ingles and someone will reach back to her after the MD response. Patient voiced understanding

## 2022-09-08 NOTE — Telephone Encounter (Signed)
-----  Message from Cleon Gustin, MD sent at 09/08/2022  8:52 AM EST ----- normal ----- Message ----- From: Sherrilyn Rist, CMA Sent: 08/28/2022   8:01 AM EST To: Cleon Gustin, MD  Please review

## 2022-09-09 NOTE — Telephone Encounter (Signed)
Made patient aware that her BMP is to check acidity and calcium in blood. She still needs to do a 24 hour urine. Made patient aware to contact the company on her 24 hour urine form and they will send her all of the items need for the 24 hour urine collection along with a return postage. Patient voiced understanding

## 2022-09-20 ENCOUNTER — Other Ambulatory Visit: Payer: Self-pay | Admitting: Adult Health

## 2022-09-21 ENCOUNTER — Ambulatory Visit: Payer: Medicare HMO | Attending: Cardiology | Admitting: *Deleted

## 2022-09-21 DIAGNOSIS — Z5181 Encounter for therapeutic drug level monitoring: Secondary | ICD-10-CM | POA: Diagnosis not present

## 2022-09-21 DIAGNOSIS — I4821 Permanent atrial fibrillation: Secondary | ICD-10-CM | POA: Diagnosis not present

## 2022-09-21 LAB — POCT INR: INR: 2.9 (ref 2.0–3.0)

## 2022-09-21 NOTE — Patient Instructions (Signed)
Continue warfarin 1/2 tablet daily except 1 tablet on Sundays, Tuesdays and Thursdays. Recheck INR in 4 weeks.  

## 2022-09-21 NOTE — Telephone Encounter (Signed)
Refill has been sent. She has an office visit pending 3/7

## 2022-09-22 ENCOUNTER — Other Ambulatory Visit: Payer: Self-pay | Admitting: Urology

## 2022-09-22 DIAGNOSIS — F339 Major depressive disorder, recurrent, unspecified: Secondary | ICD-10-CM | POA: Diagnosis not present

## 2022-09-22 DIAGNOSIS — F419 Anxiety disorder, unspecified: Secondary | ICD-10-CM | POA: Diagnosis not present

## 2022-09-22 DIAGNOSIS — F0390 Unspecified dementia without behavioral disturbance: Secondary | ICD-10-CM | POA: Diagnosis not present

## 2022-09-22 DIAGNOSIS — N200 Calculus of kidney: Secondary | ICD-10-CM | POA: Diagnosis not present

## 2022-09-23 DIAGNOSIS — S233XXA Sprain of ligaments of thoracic spine, initial encounter: Secondary | ICD-10-CM | POA: Diagnosis not present

## 2022-09-23 DIAGNOSIS — S134XXA Sprain of ligaments of cervical spine, initial encounter: Secondary | ICD-10-CM | POA: Diagnosis not present

## 2022-09-23 DIAGNOSIS — M9902 Segmental and somatic dysfunction of thoracic region: Secondary | ICD-10-CM | POA: Diagnosis not present

## 2022-09-23 DIAGNOSIS — S338XXA Sprain of other parts of lumbar spine and pelvis, initial encounter: Secondary | ICD-10-CM | POA: Diagnosis not present

## 2022-09-23 DIAGNOSIS — M9903 Segmental and somatic dysfunction of lumbar region: Secondary | ICD-10-CM | POA: Diagnosis not present

## 2022-09-23 DIAGNOSIS — M9901 Segmental and somatic dysfunction of cervical region: Secondary | ICD-10-CM | POA: Diagnosis not present

## 2022-09-25 DIAGNOSIS — Z6838 Body mass index (BMI) 38.0-38.9, adult: Secondary | ICD-10-CM | POA: Diagnosis not present

## 2022-09-25 DIAGNOSIS — M79671 Pain in right foot: Secondary | ICD-10-CM | POA: Diagnosis not present

## 2022-09-25 DIAGNOSIS — R030 Elevated blood-pressure reading, without diagnosis of hypertension: Secondary | ICD-10-CM | POA: Diagnosis not present

## 2022-09-26 ENCOUNTER — Other Ambulatory Visit: Payer: Self-pay | Admitting: "Endocrinology

## 2022-09-27 LAB — LITHOLINK 24HR URINE PANEL
Ammonium, Urine: 44 mmol/24 hr (ref 15–60)
Calcium Oxalate Saturation: 4.24 — ABNORMAL LOW (ref 6.00–10.00)
Calcium Phosphate Saturation: 0.68 (ref 0.50–2.00)
Calcium, Urine: 333 mg/24 hr — ABNORMAL HIGH (ref <200)
Calcium/Creatinine Ratio: 187 mg/g creat (ref 51–262)
Calcium/Kg Body Weight: 3.2 mg/24 hr/kg (ref <4.0)
Chloride, Urine: 194 mmol/24 hr (ref 70–250)
Citrate, Urine: 3426 mg/24 hr (ref >550)
Creatinine, Urine: 1776 mg/24 hr
Creatinine/Kg Body Weight: 17.2 mg/24 hr/kg (ref 8.7–20.3)
Cystine, Urine, Qualitative: NEGATIVE
Magnesium, Urine: 64 mg/24 hr (ref 30–120)
Oxalate, Urine: 25 mg/24 hr (ref 20–40)
Phosphorus, Urine: 760 mg/24 hr (ref 600–1200)
Potassium, Urine: 70 mmol/24 hr (ref 20–100)
Protein Catabolic Rate: 0.8 g/kg/24 hr (ref 0.8–1.4)
Sodium, Urine: 220 mmol/24 hr — ABNORMAL HIGH (ref 50–150)
Sulfate, Urine: 39 meq/24 hr (ref 20–80)
Urea Nitrogen, Urine: 9.94 g/24 hr (ref 6.00–14.00)
Uric Acid Saturation: 0.63 (ref <1.00)
Uric Acid, Urine: 848 mg/24 hr — ABNORMAL HIGH (ref <750)
Urine Volume (Preserved): 2640 mL/24 hr (ref 500–4000)
pH, 24 hr, Urine: 6.022 (ref 5.800–6.200)

## 2022-09-28 DIAGNOSIS — Z20828 Contact with and (suspected) exposure to other viral communicable diseases: Secondary | ICD-10-CM | POA: Diagnosis not present

## 2022-09-30 ENCOUNTER — Ambulatory Visit: Payer: Medicare HMO | Admitting: Urology

## 2022-10-20 DIAGNOSIS — F325 Major depressive disorder, single episode, in full remission: Secondary | ICD-10-CM | POA: Diagnosis not present

## 2022-10-20 DIAGNOSIS — F419 Anxiety disorder, unspecified: Secondary | ICD-10-CM | POA: Diagnosis not present

## 2022-10-20 DIAGNOSIS — R413 Other amnesia: Secondary | ICD-10-CM | POA: Diagnosis not present

## 2022-10-21 ENCOUNTER — Ambulatory Visit: Payer: Medicare HMO | Attending: Cardiology | Admitting: *Deleted

## 2022-10-21 DIAGNOSIS — Z5181 Encounter for therapeutic drug level monitoring: Secondary | ICD-10-CM | POA: Diagnosis not present

## 2022-10-21 DIAGNOSIS — I4821 Permanent atrial fibrillation: Secondary | ICD-10-CM | POA: Diagnosis not present

## 2022-10-21 LAB — POCT INR: INR: 3 (ref 2.0–3.0)

## 2022-10-21 NOTE — Patient Instructions (Signed)
Continue warfarin 1/2 tablet daily except 1 tablet on Sundays, Tuesdays and Thursdays. Recheck INR in 4 weeks.

## 2022-10-22 ENCOUNTER — Encounter: Payer: Self-pay | Admitting: Pulmonary Disease

## 2022-10-22 ENCOUNTER — Ambulatory Visit (INDEPENDENT_AMBULATORY_CARE_PROVIDER_SITE_OTHER): Payer: Medicare HMO | Admitting: Pulmonary Disease

## 2022-10-22 VITALS — BP 130/68 | HR 68 | Ht 64.0 in | Wt 230.6 lb

## 2022-10-22 DIAGNOSIS — G4721 Circadian rhythm sleep disorder, delayed sleep phase type: Secondary | ICD-10-CM | POA: Diagnosis not present

## 2022-10-22 DIAGNOSIS — G4733 Obstructive sleep apnea (adult) (pediatric): Secondary | ICD-10-CM | POA: Diagnosis not present

## 2022-10-22 NOTE — Assessment & Plan Note (Signed)
CPAP download was reviewed which shows excellent control of events on auto settings 8 to 14 cm with average pressure of 12 and max pressure of 13.3 cm.  She has a moderate leak and on certain nights AHI seems to be as high as 10-15/hour. Due to this we will change auto CPAP to 10 to 14 cm Last DME to provide her with new CPAP supplies including a new AirFit F30 fullface mask  Weight loss encouraged, compliance with goal of at least 4-6 hrs every night is the expectation. Advised against medications with sedative side effects Cautioned against driving when sleepy - understanding that sleepiness will vary on a day to day basis

## 2022-10-22 NOTE — Patient Instructions (Signed)
  X Change auto CPAP to 10-14 cm   Use provigil as needed

## 2022-10-22 NOTE — Assessment & Plan Note (Signed)
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 Given her Provigil to help her to be more functional in the daytime but verified that she just needs to use this on an as-needed basis only. More importantly, she should adhere to a sleep-wake schedule, using melatonin if needed to anchor her bedtime to around midnight and waking up at 9 AM with light exposure in the morning

## 2022-10-22 NOTE — Progress Notes (Signed)
   Subjective:    Patient ID: Rachel Odonnell, female    DOB: [birth year], 67 y.o.   MRN: [id number]  HPI  67 yo ex-smoker with mild emphysema ,OSA and persistent hypersomnolence - delayed sleep phase - worked night shift x 32 y  -RLS on requip     PMH -ADD - was on Adderall. -Atrial fibrillation  Chief Complaint  Patient presents with   Follow-up    CPAP working well    She was started on CPAP in 2023 and seems to have settled down with an AirFit F30 fullface mask. She needs supplies. I felt that her hypersomnolence was related more to delayed sleep phase syndrome and last office visit we started on Provigil and also discussed a detailed sleep and wake schedule trying to anchor her bedtime perhaps with melatonin and have some light exposure in the daytime   We worked out a sample schedule :    Bedtime 12 MN Take melatonin 2 h prior - 5-10 mg   Wake up 9 am Light exposure x 5mns Provigil 1 h later  She has not really been able to adhere to the schedule.  She takes Provigil on an as-needed basis the daytime.  She has memory issues and was started on Aricept  Significant tests/ events reviewed   CPAP titration 10/2021 13 cm MSLT  5/5 naps,MSL 2:50 s, no SoREMs  2022 split >>AHI 43/hour >> nasal CPAP of 10 cm   CTA chest 02/2021 mild coronary artery calcification, mild paraseptal emphysema  - non obs CAD 05/2021 LHC/RHC nml cors, PA pressures   PFTs 07/2021 mild restriction, ratio 82, FEV1 84%, FVC 78%, DLCO 75%  Review of Systems neg for any significant sore throat, dysphagia, itching, sneezing, nasal congestion or excess/ purulent secretions, fever, chills, sweats, unintended wt loss, pleuritic or exertional cp, hempoptysis, orthopnea pnd or change in chronic leg swelling. Also denies presyncope, palpitations, heartburn, abdominal pain, nausea, vomiting, diarrhea or change in bowel or urinary habits, dysuria,hematuria, rash, arthralgias, visual complaints, headache,  numbness weakness or ataxia.     Objective:   Physical Exam  Gen. Pleasant, obese, in no distress ENT - no lesions, no post nasal drip Neck: No JVD, no thyromegaly, no carotid bruits Lungs: no use of accessory muscles, no dullness to percussion, decreased without rales or rhonchi  Cardiovascular: Rhythm regular, heart sounds  normal, no murmurs or gallops, no peripheral edema Musculoskeletal: No deformities, no cyanosis or clubbing , no tremors       Assessment & Plan:

## 2022-10-30 ENCOUNTER — Other Ambulatory Visit: Payer: Self-pay | Admitting: Pulmonary Disease

## 2022-10-30 NOTE — Telephone Encounter (Signed)
Please advise on refill request

## 2022-10-30 NOTE — Telephone Encounter (Signed)
Refill for modafinil has been sent 

## 2022-11-03 ENCOUNTER — Other Ambulatory Visit: Payer: Self-pay

## 2022-11-03 DIAGNOSIS — E039 Hypothyroidism, unspecified: Secondary | ICD-10-CM | POA: Diagnosis not present

## 2022-11-03 DIAGNOSIS — E1159 Type 2 diabetes mellitus with other circulatory complications: Secondary | ICD-10-CM

## 2022-11-03 DIAGNOSIS — G4710 Hypersomnia, unspecified: Secondary | ICD-10-CM | POA: Diagnosis not present

## 2022-11-03 DIAGNOSIS — R5383 Other fatigue: Secondary | ICD-10-CM | POA: Diagnosis not present

## 2022-11-03 DIAGNOSIS — E785 Hyperlipidemia, unspecified: Secondary | ICD-10-CM | POA: Diagnosis not present

## 2022-11-03 DIAGNOSIS — E1165 Type 2 diabetes mellitus with hyperglycemia: Secondary | ICD-10-CM | POA: Diagnosis not present

## 2022-11-03 MED ORDER — TRESIBA FLEXTOUCH 200 UNIT/ML ~~LOC~~ SOPN: 80.0000 [IU] | PEN_INJECTOR | Freq: Every day | SUBCUTANEOUS | 1 refills | Status: DC

## 2022-11-04 ENCOUNTER — Ambulatory Visit (INDEPENDENT_AMBULATORY_CARE_PROVIDER_SITE_OTHER): Payer: Medicare HMO | Admitting: Urology

## 2022-11-04 VITALS — BP 145/60 | HR 59

## 2022-11-04 DIAGNOSIS — Z87442 Personal history of urinary calculi: Secondary | ICD-10-CM

## 2022-11-04 DIAGNOSIS — R102 Pelvic and perineal pain: Secondary | ICD-10-CM

## 2022-11-04 DIAGNOSIS — N3001 Acute cystitis with hematuria: Secondary | ICD-10-CM

## 2022-11-04 DIAGNOSIS — R300 Dysuria: Secondary | ICD-10-CM | POA: Diagnosis not present

## 2022-11-04 LAB — MICROSCOPIC EXAMINATION: WBC, UA: >30 /hpf — AB (ref 0–5)

## 2022-11-04 LAB — URINALYSIS, ROUTINE W REFLEX MICROSCOPIC
Bilirubin, UA: NEGATIVE
Glucose, UA: NEGATIVE
Ketones, UA: NEGATIVE
Nitrite, UA: POSITIVE — AB
Specific Gravity, UA: 1.03 (ref 1.005–1.030)
Urobilinogen, Ur: 1 mg/dL (ref 0.2–1.0)
pH, UA: 5.5 (ref 5.0–7.5)

## 2022-11-04 MED ORDER — INDAPAMIDE 2.5 MG PO TABS: 2.5000 mg | ORAL_TABLET | Freq: Every day | ORAL | 11 refills | Status: DC

## 2022-11-04 MED ORDER — CEFUROXIME AXETIL 500 MG PO TABS: 500.0000 mg | ORAL_TABLET | Freq: Two times a day (BID) | ORAL | 0 refills | Status: DC

## 2022-11-04 NOTE — Progress Notes (Unsigned)
11/04/2022 2:17 PM   Rachel Odonnell 07-15-1956 [id number]  Referring provider: Rosalee Kaufman, PA-C [place],  [place] [address]  No chief complaint on file.   HPI:    PMH: Past Medical History:  Diagnosis Date   ADD (attention deficit disorder)    Diabetes mellitus without complication ([facility])    Hypertension     Surgical History: Past Surgical History:  Procedure Laterality Date   ABDOMINAL HYSTERECTOMY     COLONOSCOPY  02/2022   EXCISIONAL HEMORRHOIDECTOMY     INCONTINENCE SURGERY     RIGHT/LEFT HEART CATH AND CORONARY ANGIOGRAPHY N/A 06/13/2021   Procedure: RIGHT/LEFT HEART CATH AND CORONARY ANGIOGRAPHY;  Surgeon: Martinique, Peter M, MD;  Location: [facility];  Service: Cardiovascular;  Laterality: N/A;    Home Medications:  Allergies as of 11/04/2022       Reactions   Azithromycin Hives   Codeine Nausea Only   Atomoxetine Nausea And Vomiting   Atorvastatin Other (See Comments)   Restless legs   Lisinopril Nausea And Vomiting        Medication List        Accurate as of November 04, 2022  2:17 PM. If you have any questions, ask your nurse or doctor.          Accu-Chek Softclix Lancets lancets Use as instructed to test blood glucose four times daily   B-D ULTRAFINE III SHORT PEN 31G X 8 MM Misc Generic drug: Insulin Pen Needle 1 each by Does not apply route as directed.   busPIRone 5 MG tablet Commonly known as: BUSPAR Take 5 mg by mouth 3 (three) times daily.   citalopram 40 MG tablet Commonly known as: CELEXA Take 40 mg by mouth every evening.   donepezil 5 MG tablet Commonly known as: ARICEPT SMARTSIG:1 Tablet(s) By Mouth Every Evening   estradiol 2 MG tablet Commonly known as: ESTRACE Take 2 mg by mouth every evening.   fenofibrate 145 MG tablet Commonly known as: TRICOR Take 145 mg by mouth every evening.   Fish Oil 1000 MG Caps Take 1,000 mg by mouth 2 (two) times daily. Morning and evening    fluorometholone 0.1 % ophthalmic suspension Commonly known as: FML Place 1 drop into both eyes 3 (three) times daily as needed (allergy eyes.).   FreeStyle Libre 2 Reader Kerrin Mo As directed   [employer] 2 Sensor Misc Change sensor every 14 days. Use to check glucose continuously   gabapentin 100 MG capsule Commonly known as: Neurontin Take 1 capsule (100 mg total) by mouth 3 (three) times daily. What changed: when to take this   glipiZIDE 5 MG tablet Commonly known as: GLUCOTROL Take 1 tablet (5 mg total) by mouth daily before breakfast.   glucose blood test strip 1 each by Other route as needed. Use as instructed to test blood glucose four times daily   isosorbide mononitrate 60 MG 24 hr tablet Commonly known as: IMDUR Take 1 tablet (60 mg total) by mouth daily.   levocetirizine 5 MG tablet Commonly known as: XYZAL Take 5 mg by mouth every evening.   levothyroxine 50 MCG tablet Commonly known as: SYNTHROID TAKE 1 TABLET BY MOUTH ONCE DAILY BEFORE BREAKFAST   metFORMIN 500 MG tablet Commonly known as: GLUCOPHAGE Take 2 tablets (1,000 mg total) by mouth 2 (two) times daily with a meal.   modafinil 100 MG tablet Commonly known as: PROVIGIL Take 1 tablet by mouth once daily   montelukast  10 MG tablet Commonly known as: SINGULAIR Take 10 mg by mouth at bedtime.   MULTIVITAMIN ADULT PO Take 1 tablet by mouth daily.   omeprazole 40 MG capsule Commonly known as: PRILOSEC Take 40 mg by mouth every evening.   rosuvastatin 10 MG tablet Commonly known as: CRESTOR Take 10 mg by mouth every evening.   Tyler Aas FlexTouch 200 UNIT/ML FlexTouch Pen Generic drug: insulin degludec Inject 80 Units into the skin at bedtime.   Vitamin D3 125 MCG (5000 UT) capsule Generic drug: Cholecalciferol Take 1 capsule (5,000 Units total) by mouth daily.   warfarin 5 MG tablet Commonly known as: COUMADIN Take as directed by the anticoagulation clinic. If you are unsure how to  take this medication, talk to your nurse or doctor. Original instructions: TAKE 1/2 TO 1 (ONE-HALF TO ONE) TABLET BY MOUTH ONCE DAILY AS  DIRECTED  BY  THE  COUMADIN  CLINIC        Allergies:  Allergies  Allergen Reactions   Azithromycin Hives   Codeine Nausea Only   Atomoxetine Nausea And Vomiting   Atorvastatin Other (See Comments)    Restless legs   Lisinopril Nausea And Vomiting    Family History: Family History  Problem Relation Age of Onset   Cancer Mother    Diabetes Father    Heart Problems Father    COPD Father    Emphysema Father    Heart block Father    Congestive Heart Failure Father     Social History:  reports that she quit smoking about 28 years ago. Her smoking use included cigarettes. She smoked an average of 2 packs per day. She has never used smokeless tobacco. She reports that she does not drink alcohol and does not use drugs.  ROS: All other review of systems were reviewed and are negative except what is noted above in HPI  Physical Exam: BP (!) 145/60   Pulse (!) 59   Constitutional:  Alert and oriented, No acute distress. HEENT: [facility] AT, moist mucus membranes.  Trachea midline, no masses. Cardiovascular: No clubbing, cyanosis, or edema. Respiratory: Normal respiratory effort, no increased work of breathing. GI: Abdomen is soft, nontender, nondistended, no abdominal masses GU: No CVA tenderness.  Lymph: No cervical or inguinal lymphadenopathy. Skin: No rashes, bruises or suspicious lesions. Neurologic: Grossly intact, no focal deficits, moving all 4 extremities. Psychiatric: Normal mood and affect.  Laboratory Data: Lab Results  Component Value Date   WBC 7.3 06/11/2021   HGB 12.6 06/13/2021   HGB 12.6 06/13/2021   HCT 37.0 06/13/2021   HCT 37.0 06/13/2021   MCV 98.1 06/11/2021   PLT 267 06/11/2021    Lab Results  Component Value Date   CREATININE 0.90 08/26/2022    No results found for: "PSA"  No results found for:  "TESTOSTERONE"  Lab Results  Component Value Date   HGBA1C 9.9 (A) 09/01/2022    Urinalysis    Component Value Date/Time   APPEARANCEUR Cloudy (A) 08/26/2022 1407   GLUCOSEU Negative 08/26/2022 1407   BILIRUBINUR Negative 08/26/2022 1407   PROTEINUR 1+ (A) 08/26/2022 1407   NITRITE Negative 08/26/2022 1407   LEUKOCYTESUR 3+ (A) 08/26/2022 1407    Lab Results  Component Value Date   LABMICR See below: 08/26/2022   WBCUA >30 (A) 08/26/2022   LABEPIT 0-10 08/26/2022   BACTERIA Moderate (A) 08/26/2022    Pertinent Imaging: *** Results for orders placed during the hospital encounter of 09/29/11  DG Abd 1 View  Narrative *RADIOLOGY REPORT*  Clinical Data: Follow up left renal calculus  ABDOMEN - 1 VIEW  Comparison: None  Findings: Calcification projects over expected position of left renal pelvis, 1.9 x 1.3 cm. No additional urinary tract calcification identified. Rounded calcifications in pelvis bilaterally likely reflect pelvic phleboliths. Osseous structures normal. Increased stool in colon.  IMPRESSION: 1.9 x 1.3 cm diameter calcification projects over the expected position of left renal pelvis.  Original Report Authenticated By: Burnetta Sabin, M.D.  No results found for this or any previous visit.  No results found for this or any previous visit.  No results found for this or any previous visit.  No results found for this or any previous visit.  No valid procedures specified. No results found for this or any previous visit.  No results found for this or any previous visit.   Assessment & Plan:    1. History of kidney stones -We will start indapamide 2.5mg  daily -followup 3 months with renal US - Urinalysis, Routine w reflex microscopic  2. Acute cystitis -Urine for culture -ceftin 500mg  BID for 7 days -Followup 7 days NV repeat urine culture   No follow-ups on file.  Nicolette Bang, MD  [facility] [place]

## 2022-11-05 ENCOUNTER — Encounter: Payer: Self-pay | Admitting: Urology

## 2022-11-05 ENCOUNTER — Other Ambulatory Visit: Payer: Self-pay | Admitting: "Endocrinology

## 2022-11-05 NOTE — Patient Instructions (Signed)

## 2022-11-07 LAB — URINE CULTURE

## 2022-11-09 DIAGNOSIS — F325 Major depressive disorder, single episode, in full remission: Secondary | ICD-10-CM | POA: Diagnosis not present

## 2022-11-09 DIAGNOSIS — E1165 Type 2 diabetes mellitus with hyperglycemia: Secondary | ICD-10-CM | POA: Diagnosis not present

## 2022-11-09 DIAGNOSIS — E039 Hypothyroidism, unspecified: Secondary | ICD-10-CM | POA: Diagnosis not present

## 2022-11-09 DIAGNOSIS — F339 Major depressive disorder, recurrent, unspecified: Secondary | ICD-10-CM | POA: Diagnosis not present

## 2022-11-09 DIAGNOSIS — G4710 Hypersomnia, unspecified: Secondary | ICD-10-CM | POA: Diagnosis not present

## 2022-11-09 DIAGNOSIS — Z0000 Encounter for general adult medical examination without abnormal findings: Secondary | ICD-10-CM | POA: Diagnosis not present

## 2022-11-09 DIAGNOSIS — E785 Hyperlipidemia, unspecified: Secondary | ICD-10-CM | POA: Diagnosis not present

## 2022-11-09 DIAGNOSIS — F0390 Unspecified dementia without behavioral disturbance: Secondary | ICD-10-CM | POA: Diagnosis not present

## 2022-11-09 DIAGNOSIS — K219 Gastro-esophageal reflux disease without esophagitis: Secondary | ICD-10-CM | POA: Diagnosis not present

## 2022-11-10 ENCOUNTER — Ambulatory Visit (INDEPENDENT_AMBULATORY_CARE_PROVIDER_SITE_OTHER): Payer: Medicare HMO | Admitting: Urology

## 2022-11-10 DIAGNOSIS — N3001 Acute cystitis with hematuria: Secondary | ICD-10-CM

## 2022-11-10 NOTE — Progress Notes (Signed)
Repeat urine culture 

## 2022-11-12 LAB — URINE CULTURE

## 2022-11-17 ENCOUNTER — Other Ambulatory Visit: Payer: Self-pay | Admitting: *Deleted

## 2022-11-17 DIAGNOSIS — E1165 Type 2 diabetes mellitus with hyperglycemia: Secondary | ICD-10-CM | POA: Diagnosis not present

## 2022-11-17 MED ORDER — ISOSORBIDE MONONITRATE ER 60 MG PO TB24: 60.0000 mg | ORAL_TABLET | Freq: Every day | ORAL | 0 refills | Status: DC

## 2022-11-18 ENCOUNTER — Ambulatory Visit: Payer: Medicare HMO | Attending: Cardiology | Admitting: *Deleted

## 2022-11-18 DIAGNOSIS — Z5181 Encounter for therapeutic drug level monitoring: Secondary | ICD-10-CM

## 2022-11-18 DIAGNOSIS — I4821 Permanent atrial fibrillation: Secondary | ICD-10-CM

## 2022-11-18 LAB — POCT INR: INR: 1.9 — AB (ref 2.0–3.0)

## 2022-11-18 NOTE — Patient Instructions (Signed)
Take warfarin 1 tablet tonight then resume 1/2 tablet daily except 1 tablet on Sundays, Tuesdays and Thursdays. Recheck INR in 4 weeks.

## 2022-12-01 ENCOUNTER — Ambulatory Visit (INDEPENDENT_AMBULATORY_CARE_PROVIDER_SITE_OTHER): Payer: Medicare HMO | Admitting: "Endocrinology

## 2022-12-01 ENCOUNTER — Encounter: Payer: Self-pay | Admitting: "Endocrinology

## 2022-12-01 VITALS — BP 130/58 | HR 52 | Ht 64.0 in | Wt 228.0 lb

## 2022-12-01 DIAGNOSIS — E039 Hypothyroidism, unspecified: Secondary | ICD-10-CM

## 2022-12-01 DIAGNOSIS — K760 Fatty (change of) liver, not elsewhere classified: Secondary | ICD-10-CM | POA: Diagnosis not present

## 2022-12-01 DIAGNOSIS — E782 Mixed hyperlipidemia: Secondary | ICD-10-CM | POA: Diagnosis not present

## 2022-12-01 DIAGNOSIS — Z6837 Body mass index (BMI) 37.0-37.9, adult: Secondary | ICD-10-CM | POA: Diagnosis not present

## 2022-12-01 DIAGNOSIS — I10 Essential (primary) hypertension: Secondary | ICD-10-CM

## 2022-12-01 DIAGNOSIS — E1159 Type 2 diabetes mellitus with other circulatory complications: Secondary | ICD-10-CM

## 2022-12-01 LAB — POCT GLYCOSYLATED HEMOGLOBIN (HGB A1C): HbA1c, POC (controlled diabetic range): 10.4 % — AB (ref 0.0–7.0)

## 2022-12-01 MED ORDER — GLIPIZIDE 5 MG PO TABS: 5.0000 mg | ORAL_TABLET | Freq: Every day | ORAL | 1 refills | Status: DC

## 2022-12-01 MED ORDER — TRESIBA FLEXTOUCH 200 UNIT/ML ~~LOC~~ SOPN: 90.0000 [IU] | PEN_INJECTOR | Freq: Every day | SUBCUTANEOUS | 1 refills | Status: DC

## 2022-12-01 MED ORDER — METFORMIN HCL 500 MG PO TABS: 500.0000 mg | ORAL_TABLET | Freq: Two times a day (BID) | ORAL | 1 refills | Status: DC

## 2022-12-01 NOTE — Progress Notes (Unsigned)
12/01/2022, 6:26 PM   Endocrinology follow-up note  Subjective:    Patient ID: Rachel Odonnell, female    DOB: 20-Sep-1955.  Rachel Odonnell is being seen in follow-up after she was seen in consultation for  management of currently uncontrolled, symptomatic type 2 diabetes requested by  Royann Shivers, PA-C.   Past Medical History:  Diagnosis Date   ADD (attention deficit disorder)    Diabetes mellitus without complication    Hypertension     Past Surgical History:  Procedure Laterality Date   ABDOMINAL HYSTERECTOMY     COLONOSCOPY  02/2022   EXCISIONAL HEMORRHOIDECTOMY     INCONTINENCE SURGERY     RIGHT/LEFT HEART CATH AND CORONARY ANGIOGRAPHY N/A 06/13/2021   Procedure: RIGHT/LEFT HEART CATH AND CORONARY ANGIOGRAPHY;  Surgeon: [place], Peter M, MD;  Location: [facility];  Service: Cardiovascular;  Laterality: N/A;    Social History   Socioeconomic History   Marital status: Married    Spouse name: Shon Hale   Number of children: Not on file   Years of education: Not on file   Highest education level: Not on file  Occupational History   Not on file  Tobacco Use   Smoking status: Former    Packs/day: 2    Types: Cigarettes    Quit date: 08/17/1994    Years since quitting: 28.3   Smokeless tobacco: Never  Vaping Use   Vaping Use: Never used  Substance and Sexual Activity   Alcohol use: No   Drug use: No   Sexual activity: Not on file  Other Topics Concern   Not on file  Social History Narrative   Lives at home with husband, Shon Hale   Former smoker Smoked 2 ppd x 25 yrs; Quit in 1996   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, retail  retired Secondary school teacher, still works part-time at the [employer], used to work Editor, commissioning   Now on fixed income from her SSI and pension   Social Determinants of Health   Financial Resource Strain: Low Risk  (10/10/2021)    Overall Financial Resource Strain (CARDIA)    Difficulty of Paying Living Expenses: Not hard at all  Food Insecurity: No Food Insecurity (10/10/2021)   Hunger Vital Sign    Worried About Running Out of Food in the Last Year: Never true    Ran Out of Food in the Last Year: Never true  Transportation Needs: No Transportation Needs (10/10/2021)   PRAPARE - Administrator, Civil Service (Medical): No    Lack of Transportation (Non-Medical): No  Physical Activity: Unknown (04/18/2021)   Exercise Vital Sign    Days of Exercise per Week: 1 day    Minutes of Exercise per Session: Not on file  Stress: No Stress Concern Present (10/10/2021)   [employer] of Occupational Health - Occupational Stress Questionnaire    Feeling of Stress : Only a little  Social Connections: Socially Integrated (10/10/2021)   Social Connection and Isolation Panel [NHANES]    Frequency of Communication with Friends and Family: More than three times a week  Frequency of Social Gatherings with Friends and Family: More than three times a week    Attends Religious Services: More than 4 times per year    Active Member of [employer] or Organizations: Yes    Attends Engineer, structural: More than 4 times per year    Marital Status: Married    Family History  Problem Relation Age of Onset   Cancer Mother    Diabetes Father    Heart Problems Father    COPD Father    Emphysema Father    Heart block Father    Congestive Heart Failure Father     Outpatient Encounter Medications as of 12/01/2022  Medication Sig   Accu-Chek Softclix Lancets lancets Use as instructed to test blood glucose four times daily   busPIRone (BUSPAR) 5 MG tablet Take 5 mg by mouth 3 (three) times daily.   cefUROXime (CEFTIN) 500 MG tablet Take 1 tablet (500 mg total) by mouth 2 (two) times daily with a meal.   Cholecalciferol (VITAMIN D3) 125 MCG (5000 UT) CAPS Take 1 capsule (5,000 Units total) by mouth daily.   citalopram  (CELEXA) 40 MG tablet Take 40 mg by mouth every evening.   Continuous Blood Gluc Receiver (FREESTYLE LIBRE 2 READER) DEVI As directed   Continuous Blood Gluc Sensor (FREESTYLE LIBRE 2 SENSOR) MISC Change sensor every 14 days. Use to check glucose continuously   donepezil (ARICEPT) 5 MG tablet SMARTSIG:1 Tablet(s) By Mouth Every Evening   estradiol (ESTRACE) 2 MG tablet Take 2 mg by mouth every evening.   fenofibrate (TRICOR) 145 MG tablet Take 145 mg by mouth every evening.   fluorometholone (FML) 0.1 % ophthalmic suspension Place 1 drop into both eyes 3 (three) times daily as needed (allergy eyes.).   gabapentin (NEURONTIN) 100 MG capsule Take 1 capsule (100 mg total) by mouth 3 (three) times daily. (Patient taking differently: Take 100 mg by mouth at bedtime.)   glipiZIDE (GLUCOTROL) 5 MG tablet Take 1 tablet (5 mg total) by mouth daily before breakfast.   glucose blood test strip 1 each by Other route as needed. Use as instructed to test blood glucose four times daily   indapamide (LOZOL) 2.5 MG tablet Take 1 tablet (2.5 mg total) by mouth daily.   insulin degludec (TRESIBA FLEXTOUCH) 200 UNIT/ML FlexTouch Pen Inject 90 Units into the skin at bedtime.   Insulin Pen Needle (B-D ULTRAFINE III SHORT PEN) 31G X 8 MM MISC 1 each by Does not apply route as directed.   isosorbide mononitrate (IMDUR) 60 MG 24 hr tablet Take 1 tablet (60 mg total) by mouth daily.   levocetirizine (XYZAL) 5 MG tablet Take 5 mg by mouth every evening.   levothyroxine (SYNTHROID) 50 MCG tablet TAKE 1 TABLET BY MOUTH ONCE DAILY BEFORE BREAKFAST   metFORMIN (GLUCOPHAGE) 500 MG tablet Take 1 tablet (500 mg total) by mouth 2 (two) times daily with a meal.   modafinil (PROVIGIL) 100 MG tablet Take 1 tablet by mouth once daily   montelukast (SINGULAIR) 10 MG tablet Take 10 mg by mouth at bedtime.   Multiple Vitamin (MULTIVITAMIN ADULT PO) Take 1 tablet by mouth daily.   Omega-3 Fatty Acids (FISH OIL) 1000 MG CAPS Take 1,000 mg  by mouth 2 (two) times daily. Morning and evening   omeprazole (PRILOSEC) 40 MG capsule Take 40 mg by mouth every evening.   rosuvastatin (CRESTOR) 10 MG tablet Take 10 mg by mouth every evening.   warfarin (COUMADIN) 5 MG  tablet TAKE 1/2 TO 1 (ONE-HALF TO ONE) TABLET BY MOUTH ONCE DAILY AS  DIRECTED  BY  THE  COUMADIN  CLINIC   [DISCONTINUED] glipiZIDE (GLUCOTROL) 5 MG tablet Take 1 tablet (5 mg total) by mouth daily before breakfast. (Patient not taking: Reported on 12/01/2022)   [DISCONTINUED] insulin degludec (TRESIBA FLEXTOUCH) 200 UNIT/ML FlexTouch Pen Inject 80 Units into the skin at bedtime.   [DISCONTINUED] metFORMIN (GLUCOPHAGE) 500 MG tablet Take 2 tablets (1,000 mg total) by mouth 2 (two) times daily with a meal.   No facility-administered encounter medications on file as of 12/01/2022.    ALLERGIES: Allergies  Allergen Reactions   Azithromycin Hives   Codeine Nausea Only   Atomoxetine Nausea And Vomiting   Atorvastatin Other (See Comments)    Restless legs   Lisinopril Nausea And Vomiting    VACCINATION STATUS: Immunization History  Administered Date(s) Administered   Fluad Quad(high Dose 65+) 05/14/2021   Moderna SARS-COV2 Booster Vaccination 06/13/2020   Moderna Sars-Covid-2 Vaccination 09/06/2019, 10/04/2019   Pneumococcal Polysaccharide-23 04/22/2017   Pneumococcal-Unspecified 05/31/2020   Tdap 04/22/2017   Zoster Recombinat (Shingrix) 11/30/2017, 02/20/2018    Diabetes She presents for her follow-up diabetic visit. She has type 2 diabetes mellitus. Onset time: Patient was diagnosed at approximate age of 49 years. Her disease course has been worsening. There are no hypoglycemic associated symptoms. Pertinent negatives for hypoglycemia include no confusion, headaches, pallor or seizures. Pertinent negatives for diabetes include no blurred vision, no chest pain, no fatigue, no polydipsia, no polyphagia and no polyuria. There are no hypoglycemic complications. Symptoms  are worsening. Diabetic complications include heart disease and peripheral neuropathy. Risk factors for coronary artery disease include dyslipidemia, diabetes mellitus, family history, obesity, post-menopausal, sedentary lifestyle, tobacco exposure and hypertension. Current diabetic treatment includes insulin injections and oral agent (dual therapy) (.). Her weight is increasing steadily. She is following a generally unhealthy diet. When asked about meal planning, she reported none. She never participates in exercise. Her home blood glucose trend is increasing steadily. Her breakfast blood glucose range is generally >200 mg/dl. Her lunch blood glucose range is generally >200 mg/dl. Her dinner blood glucose range is generally >200 mg/dl. Her bedtime blood glucose range is generally >200 mg/dl. Her overall blood glucose range is >200 mg/dl. (She presents with her CGM device showing inconsistent utility, scanning/reviewing on average 2 times a day.  Her AGP shows 12% time in range, 22% level 1 hyperglycemia, 66% level 3 hyperglycemia.  Her point-of-care A1c is 10.4%, increasing from 9.9% during her last visit.  Her average blood glucose is 282 mg per DL for the last 14 days.  She has not documented or reported any hypoglycemia.    ) An ACE inhibitor/angiotensin II receptor blocker is not being taken. Eye exam is current.  Hyperlipidemia This is a chronic problem. The current episode started more than 1 year ago. The problem is uncontrolled. Exacerbating diseases include diabetes, hypothyroidism and obesity. Pertinent negatives include no chest pain, myalgias or shortness of breath. Current antihyperlipidemic treatment includes statins, fibric acid derivatives and bile acid sequestrants. Risk factors for coronary artery disease include family history, dyslipidemia, obesity, a sedentary lifestyle, post-menopausal, hypertension and diabetes mellitus.  Hypertension This is a chronic problem. The current episode  started more than 1 year ago. Pertinent negatives include no blurred vision, chest pain, headaches, palpitations or shortness of breath. Risk factors for coronary artery disease include dyslipidemia, diabetes mellitus, family history, obesity, post-menopausal state, smoking/tobacco exposure and sedentary lifestyle. Past treatments  include direct vasodilators. Hypertensive end-organ damage includes CAD/MI.    Review of systems  Constitutional: +steadily increasing body weight,  current Body mass index is 39.14 kg/m. , no fatigue, no subjective hyperthermia, no subjective hypothermia Eyes: no blurry vision, no xerophthalmia    Objective:    BP (!) 130/58   Pulse (!) 52   Ht  (1.626 m)   Wt 228 lb (103.4 kg)   BMI 39.14 kg/m   Wt Readings from Last 3 Encounters:  12/01/22 228 lb (103.4 kg)  10/22/22 230 lb 9.6 oz (104.6 kg)  09/01/22 229 lb (103.9 kg)    BP Readings from Last 3 Encounters:  12/01/22 (!) 130/58  11/04/22 (!) 145/60  10/22/22 130/68     Physical Exam- Limited  Constitutional:  Body mass index is 39.14 kg/m. , not in acute distress, normal state of mind Eyes:  EOMI, no exophthalmos    CMP ( most recent) CMP     Component Value Date/Time   NA 137 08/26/2022 1455   K 4.8 08/26/2022 1455   CL 99 08/26/2022 1455   CO2 22 08/26/2022 1455   GLUCOSE 242 (H) 08/26/2022 1455   GLUCOSE 338 (H) 06/11/2021 1331   BUN 11 08/26/2022 1455   CREATININE 0.90 08/26/2022 1455   CALCIUM 10.1 08/26/2022 1455   PROT 7.6 07/28/2022 1509   ALBUMIN 4.6 07/28/2022 1509   AST 41 (H) 07/28/2022 1509   ALT 28 07/28/2022 1509   ALKPHOS 92 07/28/2022 1509   BILITOT 0.4 07/28/2022 1509   GFRNONAA >60 06/11/2021 1331   GFRAA >90 07/09/2012 1736    Recent Results (from the past 2160 hour(s))  POCT INR     Status: Normal   Collection Time: 09/21/22  3:03 PM  Result Value Ref Range   INR 2.9 2.0 - 3.0   POC INR    [facility] 24Hr Urine Panel     Status: Abnormal    Collection Time: 09/22/22  7:00 AM  Result Value Ref Range   Cystine, Urine, Qualitative Neg Negative   Urine Volume (Preserved) 2,640 500 - 4,000 mL/24 hr   Calcium Oxalate Saturation 4.24 (L) 6.00 - 10.00   Calcium, Urine 333 (H) <200 mg/24 hr   Oxalate, Urine 25 20 - 40 mg/24 hr   Citrate, Urine 3,426 >550 mg/24 hr    Comment: The urine Cit result was verified by repeat analysis.   Calcium Phosphate Saturation 0.68 0.50 - 2.00   pH, 24 hr, Urine 6.022 5.800 - 6.200   Uric Acid Saturation 0.63 <1.00   Uric Acid, Urine 848 (H) <750 mg/24 hr   Sodium, Urine 220 (H) 50 - 150 mmol/24 hr   Potassium, Urine 70 20 - 100 mmol/24 hr   Magnesium, Urine 64 30 - 120 mg/24 hr   Phosphorus, Urine 760 600 - 1,200 mg/24 hr   Ammonium, Urine 44 15 - 60 mmol/24 hr   Chloride, Urine 194 70 - 250 mmol/24 hr   Sulfate, Urine 39 20 - 80 meq/24 hr   Urea Nitrogen, Urine 9.94 6.00 - 14.00 g/24 hr   Protein Catabolic Rate 0.8 0.8 - 1.4 g/kg/24 hr   Creatinine, Urine 1,776 Not Applic. mg/24 hr   Creatinine/Kg Body Weight 17.2 8.7 - 20.3 mg/24 hr/kg   Calcium/Kg Body Weight 3.2 <4.0 mg/24 hr/kg   Calcium/Creatinine Ratio 187 51 - 262 mg/g creat   Comment Note   POCT INR     Status: Normal   Collection Time: 10/21/22  3:19 PM  Result Value Ref Range   INR 3.0 2.0 - 3.0   POC INR    Urinalysis, Routine w reflex microscopic     Status: Abnormal   Collection Time: 11/04/22  2:08 PM  Result Value Ref Range   Specific Gravity, UA 1.030 1.005 - 1.030   pH, UA 5.5 5.0 - 7.5   Color, UA Yellow Yellow   Appearance Ur Cloudy (A) Clear   Leukocytes,UA 3+ (A) Negative   Protein,UA 1+ (A) Negative/Trace   Glucose, UA Negative Negative   Ketones, UA Negative Negative   RBC, UA 1+ (A) Negative   Bilirubin, UA Negative Negative   Urobilinogen, Ur 1.0 0.2 - 1.0 mg/dL   Nitrite, UA Positive (A) Negative   Microscopic Examination See below:     Comment: Microscopic was indicated and was performed.  Microscopic  Examination     Status: Abnormal   Collection Time: 11/04/22  2:08 PM   Urine  Result Value Ref Range   WBC, UA >30 (A) 0 - 5 /hpf   RBC, Urine 3-10 (A) 0 - 2 /hpf   Epithelial Cells (non renal) 0-10 0 - 10 /hpf   Bacteria, UA Many (A) None seen/Few  Urine Culture     Status: Abnormal   Collection Time: 11/04/22  2:43 PM   Specimen: Urine   UR  Result Value Ref Range   Urine Culture, Routine Final report (A)    Organism ID, Bacteria Escherichia coli (A)     Comment: Cefazolin <=4 ug/mL Cefazolin with an MIC <=16 predicts susceptibility to the oral agents cefaclor, cefdinir, cefpodoxime, cefprozil, cefuroxime, cephalexin, and loracarbef when used for therapy of uncomplicated urinary tract infections due to E. coli, Klebsiella pneumoniae, and Proteus mirabilis. Greater than 100,000 colony forming units per mL    Antimicrobial Susceptibility Comment     Comment:       ** S = Susceptible; I = Intermediate; R = Resistant **                    P = Positive; N = Negative             MICS are expressed in micrograms per mL    Antibiotic                 RSLT#1    RSLT#2    RSLT#3    RSLT#4 Amoxicillin/Clavulanic Acid    S Ampicillin                     S Cefepime                       S Ceftriaxone                    S Cefuroxime                     S Ciprofloxacin                  S Ertapenem                      S Gentamicin                     S Imipenem                       S Levofloxacin  S Meropenem                      S Nitrofurantoin                 S Piperacillin/Tazobactam        S Tetracycline                   S Tobramycin                     S Trimethoprim/Sulfa             S   Urine Culture     Status: None   Collection Time: 11/10/22  1:27 PM   Specimen: Urine   UR  Result Value Ref Range   Urine Culture, Routine Final report    Organism ID, Bacteria Comment     Comment: Mixed urogenital flora 10,000-25,000 colony forming units per mL    POCT INR     Status: Abnormal   Collection Time: 11/18/22  2:57 PM  Result Value Ref Range   INR 1.9 (A) 2.0 - 3.0   POC INR    HgB A1c     Status: Abnormal   Collection Time: 12/01/22  2:48 PM  Result Value Ref Range   Hemoglobin A1C     HbA1c POC (<> result, manual entry)     HbA1c, POC (prediabetic range)     HbA1c, POC (controlled diabetic range) 10.4 (A) 0.0 - 7.0 %     Assessment & Plan:   1) DM type 2 causing vascular disease (HCC)  - BEVERLYN MCGINNESS has currently uncontrolled symptomatic type 2 DM since  67 years of age.  She presents with her CGM device showing inconsistent utility, scanning/reviewing on average 2 times a day.  Her AGP shows 12% time in range, 22% level 1 hyperglycemia, 66% level 3 hyperglycemia.  Her point-of-care A1c is 10.4%, increasing from 9.9% during her last visit.  Her average blood glucose is 282 mg per DL for the last 14 days.  She has not documented or reported any hypoglycemia.     Recent labs reviewed.  - I had a long discussion with her about the progressive nature of diabetes and the pathology behind its complications. -her diabetes is complicated by coronary artery disease, peripheral neuropathy, obesity, sedentary life, nonalcoholic fatty liver disease and she remains at extremely  high risk for more acute and chronic complications which include CAD, CVA, CKD, retinopathy, and neuropathy. These are all discussed in detail with her.  - I discussed all available options of managing her diabetes including de-escalation of medications.  Considering her associated comorbidities including sleep apnea requiring CPAP, nonalcoholic fatty liver disease, hypertension, hyperlipidemia, she is a perfect candidate for lifestyle medicine.    I have counseled her on diet  and weight management  by adopting a Whole Food , Plant Predominant  ( WFPP) nutrition as recommended by [employer] of Lifestyle Medicine. Patient is encouraged to switch to  unprocessed  or minimally processed  complex starch, adequate protein intake (mainly plant source), minimal liquid fat ( mainly vegetable oils), plenty of fruits, and vegetables. -  she is advised to stick to a routine mealtimes to eat 3 complete meals a day and snack only when necessary ( to snack only to correct hypoglycemia BG <70 day time or <100 at night).   - she acknowledges that there is a room for improvement in her  food and drink choices. - Suggestion is made for her to avoid simple carbohydrates  from her diet including Cakes, Sweet Desserts, Ice Cream, Soda (diet and regular), Sweet Tea, Candies, Chips, Cookies, Store Bought Juices, Alcohol , Artificial Sweeteners,  Coffee Creamer, and "Sugar-free" Products, Lemonade. This will help patient to have more stable blood glucose profile and potentially avoid unintended weight gain.  The following Lifestyle Medicine recommendations according to American College of Lifestyle Medicine  [facility]) were discussed and and offered to patient and she  agrees to start the journey:  A. Whole Foods, Plant-Based Nutrition comprising of fruits and vegetables, plant-based proteins, whole-grain carbohydrates was discussed in detail with the patient.   A list for source of those nutrients were also provided to the patient.  Patient will use only water or unsweetened tea for hydration. B.  The need to stay away from risky substances including alcohol, smoking; obtaining 7 to 9 hours of restorative sleep, at least 150 minutes of moderate intensity exercise weekly, the importance of healthy social connections,  and stress management techniques were discussed. C.  A full color page of  Calorie density of various food groups per pound showing examples of each food groups was provided to the patient.  - she has been scheduled with Norm Salt, RDN, CDE for individualized diabetes education.  - I have approached her with the following plan to manage  her diabetes and patient  agrees:   In light of her presentation with prevailing hyperglycemic burden, she will need higher dose of insulin to control her glycemia to target.  Accordingly, she is advised to increase her Tresiba to 90 units nightly, advised to review/scan blood glucose at least 4 times a day and return in 2 weeks with her device for reevaluation.  -In the meantime, she is advised to continue metformin 500 mg p.o. twice daily,  continue glipizide 5 mg XL p.o. daily at breakfast.   -  she is encouraged to call clinic for blood glucose levels less than 70 or above 200 mg /dl. - she has been on GLP1 therapy in the past and tolerated it well but insurance did not provide optimal coverage.  -She did not tolerate Jardiance in a prior attempt.  - Specific targets for  A1c;  LDL, HDL,  and Triglycerides were discussed with the patient.  2) Blood Pressure /Hypertension:   -Her blood pressure is controlled to target.  she is advised to continue her current medications including Imdur 60 mg p.o. daily with breakfast .  3) Lipids/Hyperlipidemia:    Her most recent lipid panel from 07/28/22 shows uncontrolled LDL of 120 and significantly elevated triglycerides of 359.  She is advised to continue on her current triple agent therapy including fenofibrate 145 mg p.o. daily, omega-3 fatty acids 1000 mg p.o. twice daily, and Crestor 10 mg p.o. daily at bedtime.  She has a chance to simplify this treatment with proper engagement in lifestyle medicine.  She is hesitant to engage with lifestyle medicine for now.  Side effects and precautions discussed with her.  4)  Weight/Diet:   Her Body mass index is 39.14 kg/m.  -   clearly complicating her diabetes care.   she is  a candidate for weight loss. I discussed with her the fact that loss of 5 - 10% of her  current body weight will have the most impact on her diabetes management.  The above detailed  ACLM recommendations for nutrition, exercise, sleep, social life, avoidance of  risky substances, the need for restorative sleep   information will also detailed on discharge instructions.  5) Hypothyroidism:  The circumstances of her diagnosis are not available to review.  Her previsit thyroid function tests are consistent with appropriate hormone replacement.  She is advised to continue Levothyroxine 50 mcg p.o. daily before breakfast.    - We discussed about the correct intake of her thyroid hormone, on empty stomach at fasting, with water, separated by at least 30 minutes from breakfast and other medications,  and separated by more than 4 hours from calcium, iron, multivitamins, acid reflux medications (PPIs). -Patient is made aware of the fact that thyroid hormone replacement is needed for life, dose to be adjusted by periodic monitoring of thyroid function tests.   6) Chronic Care/Health Maintenance: -she is on Statin medications and  is encouraged to initiate and continue to follow up with Ophthalmology, Dentist,  Podiatrist at least yearly or according to recommendations, and advised to   stay away from smoking. I have recommended yearly flu vaccine and pneumonia vaccine at least every 5 years; moderate intensity exercise for up to 150 minutes weekly; and  sleep for 7- 9 hours a day.  -She will need to have vitamin D supplement with vitamin D3 5000 units daily x90 days.  - she is advised to maintain close follow up with Royann Shivers, PA-C for primary care needs, as well as her other providers for optimal and coordinated care.   I spent  42  minutes in the care of the patient today including review of labs from CMP, Lipids, Thyroid Function, Hematology (current and previous including abstractions from other facilities); face-to-face time discussing  her blood glucose readings/logs, discussing hypoglycemia and hyperglycemia episodes and symptoms, medications doses, her options of short and long term treatment based on the latest standards of care / guidelines;   discussion about incorporating lifestyle medicine;  and documenting the encounter. Risk reduction counseling performed per USPSTF guidelines to reduce  obesity and cardiovascular risk factors.     Please refer to Patient Instructions for Blood Glucose Monitoring and Insulin/Medications Dosing Guide"  in media tab for additional information. Please  also refer to " Patient Self Inventory" in the Media  tab for reviewed elements of pertinent patient history.  Meris Reede Dudenhoeffer participated in the discussions, expressed understanding, and voiced agreement with the above plans.  All questions were answered to her satisfaction. she is encouraged to contact clinic should she have any questions or concerns prior to her return visit.     Follow up plan: - Return in about 2 weeks (around 12/15/2022) for F/U with Meter/CGM /Logs Only - no Labs.  Ronny Bacon, [facility] [address] [place], [place] [address] Phone: [phone] Fax: [phone]  12/01/2022, 6:26 PM

## 2022-12-01 NOTE — Patient Instructions (Signed)

## 2022-12-02 ENCOUNTER — Encounter: Payer: Self-pay | Admitting: "Endocrinology

## 2022-12-04 ENCOUNTER — Other Ambulatory Visit: Payer: Self-pay

## 2022-12-04 DIAGNOSIS — E1159 Type 2 diabetes mellitus with other circulatory complications: Secondary | ICD-10-CM

## 2022-12-04 MED ORDER — FREESTYLE LIBRE 2 READER DEVI: 0 refills | Status: DC

## 2022-12-14 DIAGNOSIS — R030 Elevated blood-pressure reading, without diagnosis of hypertension: Secondary | ICD-10-CM | POA: Diagnosis not present

## 2022-12-14 DIAGNOSIS — Z6837 Body mass index (BMI) 37.0-37.9, adult: Secondary | ICD-10-CM | POA: Diagnosis not present

## 2022-12-14 DIAGNOSIS — J209 Acute bronchitis, unspecified: Secondary | ICD-10-CM | POA: Diagnosis not present

## 2022-12-14 DIAGNOSIS — J449 Chronic obstructive pulmonary disease, unspecified: Secondary | ICD-10-CM | POA: Diagnosis not present

## 2022-12-15 ENCOUNTER — Ambulatory Visit: Payer: Medicare HMO | Attending: Cardiology | Admitting: *Deleted

## 2022-12-15 DIAGNOSIS — Z5181 Encounter for therapeutic drug level monitoring: Secondary | ICD-10-CM

## 2022-12-15 DIAGNOSIS — I4821 Permanent atrial fibrillation: Secondary | ICD-10-CM | POA: Diagnosis not present

## 2022-12-15 LAB — POCT INR: INR: 4.3 — AB (ref 2.0–3.0)

## 2022-12-15 NOTE — Patient Instructions (Signed)
Has acute bronchitis and COPD.  Started on Levaquin 500mg  daily x 5 days and tessalon pearls.  Had steroid shot 4/29. Hold warfarin tonight then take 1/2 tablet while on Levaquin then resume 1/2 tablet daily except 1 tablet on Sundays, Tuesdays and Thursdays. Recheck INR in 2 weeks.

## 2022-12-21 ENCOUNTER — Ambulatory Visit: Payer: Medicare HMO | Admitting: Cardiology

## 2022-12-23 ENCOUNTER — Ambulatory Visit: Payer: Medicare HMO | Attending: Cardiology | Admitting: Cardiology

## 2022-12-23 ENCOUNTER — Encounter: Payer: Self-pay | Admitting: Cardiology

## 2022-12-23 VITALS — BP 131/63 | HR 57 | Ht 64.0 in | Wt 225.8 lb

## 2022-12-23 DIAGNOSIS — I2510 Atherosclerotic heart disease of native coronary artery without angina pectoris: Secondary | ICD-10-CM

## 2022-12-23 DIAGNOSIS — D6869 Other thrombophilia: Secondary | ICD-10-CM | POA: Diagnosis not present

## 2022-12-23 DIAGNOSIS — R0609 Other forms of dyspnea: Secondary | ICD-10-CM

## 2022-12-23 DIAGNOSIS — I480 Paroxysmal atrial fibrillation: Secondary | ICD-10-CM | POA: Diagnosis not present

## 2022-12-23 MED ORDER — ROSUVASTATIN CALCIUM 20 MG PO TABS: 20.0000 mg | ORAL_TABLET | Freq: Every day | ORAL | 3 refills | Status: DC

## 2022-12-23 NOTE — Progress Notes (Addendum)
Clinical Summary Ms. Ciresi is a 67 y.o.female seen today for follow up of the following medical problems.   Chest pain/DOE/CAD - Nuclear stress test on 03/11/2018 was normal     02/2021 DSE [facility]: baseline LVEF 60-65%, normal RV, mild AI. 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 LVEDP 11, mean PA 22, PCWP 2, CI 2.3  07/2021 PFTs: some restriction likely secondary to body habitus  -chronic SOB. Recently noted symptoms even more during a cruise where her activity level was much higher than baseline.      2. 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 LVEDP 11, mean PA 22, PCWP 2, CI 2.3   Infrequent jaw pains, did improved on imdur.      3. Afib, PAF - cost too high for both eliquis and xarelto, chagned to coumadin 03/2022 - has not required av nodal agent   - no recent palpitations - no bleeding on coumadin.    4. OSA - on cpap, followed by Dr Vassie Loll       5. Hyperlipidemia - she is on crestor 10mg , fenofirbate.  07/2022 TC 219 TG 359 HDL 36 LDL 120 Past Medical History:  Diagnosis Date   ADD (attention deficit disorder)    Diabetes mellitus without complication (HCC)    Hypertension      Allergies  Allergen Reactions   Azithromycin Hives   Codeine Nausea Only   Atomoxetine Nausea And Vomiting   Atorvastatin Other (See Comments)    Restless legs   Lisinopril Nausea And Vomiting     Current Outpatient Medications  Medication Sig Dispense Refill   Accu-Chek Softclix Lancets lancets Use as instructed to test blood glucose four times daily 100 each 2   busPIRone (BUSPAR) 5 MG tablet Take 5 mg by mouth 3 (three) times daily.     cefUROXime (CEFTIN) 500 MG tablet Take 1 tablet (500 mg total) by mouth 2 (two) times daily with a meal. 14 tablet 0   Cholecalciferol (VITAMIN D3) 125 MCG (5000 UT) CAPS Take 1 capsule (5,000 Units total) by mouth daily. 90 capsule 0   citalopram (CELEXA) 40 MG tablet Take 40 mg by  mouth every evening.     Continuous Blood Gluc Sensor (FREESTYLE LIBRE 2 SENSOR) MISC Change sensor every 14 days. Use to check glucose continuously 2 each 0   Continuous Glucose Receiver (FREESTYLE LIBRE 2 READER) DEVI USE TO CHECK GLUCOSE AS DIRECTED 1 each 0   donepezil (ARICEPT) 5 MG tablet SMARTSIG:1 Tablet(s) By Mouth Every Evening     estradiol (ESTRACE) 2 MG tablet Take 2 mg by mouth every evening.     fenofibrate (TRICOR) 145 MG tablet Take 145 mg by mouth every evening.     gabapentin (NEURONTIN) 100 MG capsule Take 1 capsule (100 mg total) by mouth 3 (three) times daily. (Patient taking differently: Take 100 mg by mouth at bedtime.) 90 capsule 11   glipiZIDE (GLUCOTROL) 5 MG tablet Take 1 tablet (5 mg total) by mouth daily before breakfast. 90 tablet 1   glucose blood test strip 1 each by Other route as needed. Use as instructed to test blood glucose four times daily 100 each 2   indapamide (LOZOL) 2.5 MG tablet Take 1 tablet (2.5 mg total) by mouth daily. 30 tablet 11   insulin degludec (TRESIBA FLEXTOUCH) 200 UNIT/ML FlexTouch Pen Inject 90 Units into the skin at bedtime. 30 mL 1   Insulin Pen Needle (B-D  ULTRAFINE III SHORT PEN) 31G X 8 MM MISC 1 each by Does not apply route as directed. 100 each 2   isosorbide mononitrate (IMDUR) 60 MG 24 hr tablet Take 1 tablet (60 mg total) by mouth daily. 90 tablet 0   levocetirizine (XYZAL) 5 MG tablet Take 5 mg by mouth every evening.     levothyroxine (SYNTHROID) 50 MCG tablet TAKE 1 TABLET BY MOUTH ONCE DAILY BEFORE BREAKFAST 90 tablet 0   metFORMIN (GLUCOPHAGE) 500 MG tablet Take 1 tablet (500 mg total) by mouth 2 (two) times daily with a meal. 180 tablet 1   modafinil (PROVIGIL) 100 MG tablet Take 1 tablet by mouth once daily 30 tablet 0   montelukast (SINGULAIR) 10 MG tablet Take 10 mg by mouth at bedtime.     Multiple Vitamin (MULTIVITAMIN ADULT PO) Take 1 tablet by mouth daily.     Omega-3 Fatty Acids (FISH OIL) 1000 MG CAPS Take 1,000  mg by mouth 2 (two) times daily. Morning and evening     omeprazole (PRILOSEC) 40 MG capsule Take 40 mg by mouth every evening.     rosuvastatin (CRESTOR) 10 MG tablet Take 10 mg by mouth every evening.     warfarin (COUMADIN) 5 MG tablet TAKE 1/2 TO 1 (ONE-HALF TO ONE) TABLET BY MOUTH ONCE DAILY AS  DIRECTED  BY  THE  COUMADIN  CLINIC 90 tablet 3   No current facility-administered medications for this visit.     Past Surgical History:  Procedure Laterality Date   ABDOMINAL HYSTERECTOMY     COLONOSCOPY  02/2022   EXCISIONAL HEMORRHOIDECTOMY     INCONTINENCE SURGERY     RIGHT/LEFT HEART CATH AND CORONARY ANGIOGRAPHY N/A 06/13/2021   Procedure: RIGHT/LEFT HEART CATH AND CORONARY ANGIOGRAPHY;  Surgeon: [place], Peter M, MD;  Location: [facility] INVASIVE CV LAB;  Service: Cardiovascular;  Laterality: N/A;     Allergies  Allergen Reactions   Azithromycin Hives   Codeine Nausea Only   Atomoxetine Nausea And Vomiting   Atorvastatin Other (See Comments)    Restless legs   Lisinopril Nausea And Vomiting      Family History  Problem Relation Age of Onset   Cancer Mother    Diabetes Father    Heart Problems Father    COPD Father    Emphysema Father    Heart block Father    Congestive Heart Failure Father      Social History Ms. Mingle reports that she quit smoking about 28 years ago. Her smoking use included cigarettes. She smoked an average of 2 packs per day. She has never used smokeless tobacco. Ms. Boyke reports no history of alcohol use.   Review of Systems CONSTITUTIONAL: No weight loss, fever, chills, weakness or fatigue.  HEENT: Eyes: No visual loss, blurred vision, double vision or yellow sclerae.No hearing loss, sneezing, congestion, runny nose or sore throat.  SKIN: No rash or itching.  CARDIOVASCULAR: per hpi RESPIRATORY: per hpi GASTROINTESTINAL: No anorexia, nausea, vomiting or diarrhea. No abdominal pain or blood.  GENITOURINARY: No burning on urination, no  polyuria NEUROLOGICAL: No headache, dizziness, syncope, paralysis, ataxia, numbness or tingling in the extremities. No change in bowel or bladder control.  MUSCULOSKELETAL: No muscle, back pain, joint pain or stiffness.  LYMPHATICS: No enlarged nodes. No history of splenectomy.  PSYCHIATRIC: No history of depression or anxiety.  ENDOCRINOLOGIC: No reports of sweating, cold or heat intolerance. No polyuria or polydipsia.  Marland Kitchen   Physical Examination Today's Vitals   12/23/22  1601  BP: 131/63  Pulse: (!) 57  SpO2: 98%  Weight: 225 lb 12.8 oz (102.4 kg)  Height: 5\' 4"  (1.626 m)   Body mass index is 38.76 kg/m.  Gen: resting comfortably, no acute distress HEENT: no scleral icterus, pupils equal round and reactive, no palptable cervical adenopathy,  CV: RRR, 2/6 systolic murmur rusb, no jvd Resp: Clear to auscultation bilaterally GI: abdomen is soft, non-tender, non-distended, normal bowel sounds, no hepatosplenomegaly MSK: extremities are warm, no edema.  Skin: warm, no rash Neuro:  no focal deficits Psych: appropriate affect   Diagnostic Studies  05/2021 RHC/LHC   Prox LAD to Mid LAD lesion is 45% stenosed.   1st Mrg lesion is 60% stenosed.   Prox RCA to Mid RCA lesion is 25% stenosed.   The left ventricular systolic function is normal.   LV end diastolic pressure is normal.   The left ventricular ejection fraction is 55-65% by visual estimate.   Hemodynamic findings consistent with pulmonary hypertension.   Nonobstructive CAD Normal LV filling pressures.  Normal right heart pressures Normal cardiac output.    Plan: I do not see a cardiac cause for her symptoms. Would recommend medical therapy and risk factor modification for her CAD.   04/2021 monitor 12 day monitor Rare supraventricular ectopy in the form of isolated PACs, couplets, triplets. Fourteen episodes of SVT longest 8 beats Rare ventricular ectopy in the form of isolated PVCs, couplets. Four episodes of NSVT  longest 15 beats. Reported symptoms correlated with sinus rhythm, PACs, PVCs, and afib Episodes of afib <1% burden, rates were controlled Single nocturnal pause 3.1 seconds     Assessment and Plan  1.DOE/SOB - overall benign cardiac workup with echo and RHC/LHC - would appear to be due to obesity BMI 39 and deconditioning. Strongly encouraged regular exercise, a day 5 days a week - no plans for additional testing at this time.    2. Afib/acquired thrombophilia - has not required av nodal agents - no symptoms - DOACs too expensive, transitioned to coumadin. Continue current meds - EKG today shows NSR   3. CAD - mild to moderate by recent cath - jaw pain has improved with imdur, perhaps some vasospasm or microvasc disease - doing well, continue current meds   4. Hyperlipidemia - above goal, increase crestor to 20mg  daily    F/u 6 months   Antoine Poche, M.D.

## 2022-12-23 NOTE — Patient Instructions (Signed)
Medication Instructions:  Your physician has recommended you make the following change in your medication:   -Increase Crestor to 20 mg once daily  *If you need a refill on your cardiac medications before your next appointment, please call your pharmacy*   Lab Work: None If you have labs (blood work) drawn today and your tests are completely normal, you will receive your results only by: MyChart Message (if you have MyChart) OR A paper copy in the mail If you have any lab test that is abnormal or we need to change your treatment, we will call you to review the results.   Testing/Procedures: None   Follow-Up: At [facility], you and your health needs are our priority.  As part of our continuing mission to provide you with exceptional heart care, we have created designated Provider Care Teams.  These Care Teams include your primary Cardiologist (physician) and Advanced Practice Providers (APPs -  Physician Assistants and Nurse Practitioners) who all work together to provide you with the care you need, when you need it.  We recommend signing up for the patient portal called "MyChart".  Sign up information is provided on this After Visit Summary.  MyChart is used to connect with patients for Virtual Visits (Telemedicine).  Patients are able to view lab/test results, encounter notes, upcoming appointments, etc.  Non-urgent messages can be sent to your provider as well.   To learn more about what you can do with MyChart, go to [email or web address].    Your next appointment:   6 month(s)  Provider:   Dina Rich, MD    Other Instructions

## 2022-12-24 DIAGNOSIS — Z1231 Encounter for screening mammogram for malignant neoplasm of breast: Secondary | ICD-10-CM | POA: Diagnosis not present

## 2022-12-29 ENCOUNTER — Ambulatory Visit: Payer: Medicare HMO | Admitting: "Endocrinology

## 2022-12-29 ENCOUNTER — Ambulatory Visit: Payer: Medicare HMO | Attending: Cardiology | Admitting: *Deleted

## 2022-12-29 DIAGNOSIS — I4821 Permanent atrial fibrillation: Secondary | ICD-10-CM

## 2022-12-29 DIAGNOSIS — Z5181 Encounter for therapeutic drug level monitoring: Secondary | ICD-10-CM

## 2022-12-29 LAB — POCT INR: INR: 2.5 (ref 2.0–3.0)

## 2022-12-29 NOTE — Patient Instructions (Signed)
Continue warfarin 1/2 tablet daily except 1 tablet on Sundays, Tuesdays and Thursdays Recheck INR in 4 weeks. 

## 2022-12-31 DIAGNOSIS — Z7984 Long term (current) use of oral hypoglycemic drugs: Secondary | ICD-10-CM | POA: Diagnosis not present

## 2022-12-31 DIAGNOSIS — H524 Presbyopia: Secondary | ICD-10-CM | POA: Diagnosis not present

## 2022-12-31 DIAGNOSIS — H52223 Regular astigmatism, bilateral: Secondary | ICD-10-CM | POA: Diagnosis not present

## 2022-12-31 DIAGNOSIS — E119 Type 2 diabetes mellitus without complications: Secondary | ICD-10-CM | POA: Diagnosis not present

## 2022-12-31 DIAGNOSIS — H5203 Hypermetropia, bilateral: Secondary | ICD-10-CM | POA: Diagnosis not present

## 2022-12-31 DIAGNOSIS — Z794 Long term (current) use of insulin: Secondary | ICD-10-CM | POA: Diagnosis not present

## 2022-12-31 DIAGNOSIS — H2513 Age-related nuclear cataract, bilateral: Secondary | ICD-10-CM | POA: Diagnosis not present

## 2023-01-04 DIAGNOSIS — S233XXA Sprain of ligaments of thoracic spine, initial encounter: Secondary | ICD-10-CM | POA: Diagnosis not present

## 2023-01-04 DIAGNOSIS — M9902 Segmental and somatic dysfunction of thoracic region: Secondary | ICD-10-CM | POA: Diagnosis not present

## 2023-01-04 DIAGNOSIS — S338XXA Sprain of other parts of lumbar spine and pelvis, initial encounter: Secondary | ICD-10-CM | POA: Diagnosis not present

## 2023-01-04 DIAGNOSIS — M9903 Segmental and somatic dysfunction of lumbar region: Secondary | ICD-10-CM | POA: Diagnosis not present

## 2023-01-04 DIAGNOSIS — S134XXA Sprain of ligaments of cervical spine, initial encounter: Secondary | ICD-10-CM | POA: Diagnosis not present

## 2023-01-04 DIAGNOSIS — M9901 Segmental and somatic dysfunction of cervical region: Secondary | ICD-10-CM | POA: Diagnosis not present

## 2023-01-14 ENCOUNTER — Other Ambulatory Visit: Payer: Self-pay

## 2023-01-14 ENCOUNTER — Other Ambulatory Visit: Payer: Self-pay | Admitting: *Deleted

## 2023-01-14 DIAGNOSIS — I4891 Unspecified atrial fibrillation: Secondary | ICD-10-CM

## 2023-01-14 DIAGNOSIS — E039 Hypothyroidism, unspecified: Secondary | ICD-10-CM

## 2023-01-14 MED ORDER — GABAPENTIN 100 MG PO CAPS: 100.0000 mg | ORAL_CAPSULE | Freq: Three times a day (TID) | ORAL | 5 refills | Status: DC

## 2023-01-14 MED ORDER — LEVOTHYROXINE SODIUM 50 MCG PO TABS
50.0000 ug | ORAL_TABLET | Freq: Every day | ORAL | 0 refills | Status: DC
Start: 2023-01-14 — End: 2023-03-17

## 2023-01-14 MED ORDER — ISOSORBIDE MONONITRATE ER 60 MG PO TB24: 60.0000 mg | ORAL_TABLET | Freq: Every day | ORAL | 3 refills | Status: DC

## 2023-01-14 MED ORDER — ROSUVASTATIN CALCIUM 20 MG PO TABS: 20.0000 mg | ORAL_TABLET | Freq: Every day | ORAL | 3 refills | Status: DC

## 2023-01-14 MED ORDER — WARFARIN SODIUM 5 MG PO TABS
ORAL_TABLET | ORAL | 0 refills | Status: DC
Start: 2023-01-14 — End: 2023-05-28

## 2023-01-14 NOTE — Telephone Encounter (Signed)
Prescription refill request received for warfarin Lov: [facility], 12/23/2022 Next INR check: 6/11 Warfarin tablet strength: 5mg 

## 2023-01-14 NOTE — Telephone Encounter (Signed)
Requested Prescriptions   Pending Prescriptions Disp Refills   gabapentin (NEURONTIN) 100 MG capsule 90 capsule 11    Sig: Take 1 capsule (100 mg total) by mouth 3 (three) times daily.   Last seen 03/23/22, next appt not scheduled Dispenses   Dispensed Days Supply Quantity Provider Pharmacy  GABAPENTIN 100MG     CAP 10/30/2022 30 90 each Levert Feinstein, MD [facility] [phone] ...  GABAPENTIN 100MG     CAP 06/07/2022 30 90 each Levert Feinstein, MD [facility] [phone] ...  GABAPENTIN 100MG     CAP 04/29/2022 30 90 each Levert Feinstein, MD [facility] [phone] .Marland KitchenMarland Kitchen

## 2023-01-18 DIAGNOSIS — E039 Hypothyroidism, unspecified: Secondary | ICD-10-CM | POA: Diagnosis not present

## 2023-01-18 DIAGNOSIS — G4733 Obstructive sleep apnea (adult) (pediatric): Secondary | ICD-10-CM | POA: Diagnosis not present

## 2023-01-18 DIAGNOSIS — F325 Major depressive disorder, single episode, in full remission: Secondary | ICD-10-CM | POA: Diagnosis not present

## 2023-01-18 DIAGNOSIS — E785 Hyperlipidemia, unspecified: Secondary | ICD-10-CM | POA: Diagnosis not present

## 2023-01-18 DIAGNOSIS — E1165 Type 2 diabetes mellitus with hyperglycemia: Secondary | ICD-10-CM | POA: Diagnosis not present

## 2023-01-18 DIAGNOSIS — F339 Major depressive disorder, recurrent, unspecified: Secondary | ICD-10-CM | POA: Diagnosis not present

## 2023-01-18 DIAGNOSIS — F0390 Unspecified dementia without behavioral disturbance: Secondary | ICD-10-CM | POA: Diagnosis not present

## 2023-01-18 DIAGNOSIS — K219 Gastro-esophageal reflux disease without esophagitis: Secondary | ICD-10-CM | POA: Diagnosis not present

## 2023-01-18 DIAGNOSIS — F419 Anxiety disorder, unspecified: Secondary | ICD-10-CM | POA: Diagnosis not present

## 2023-01-18 DIAGNOSIS — G4710 Hypersomnia, unspecified: Secondary | ICD-10-CM | POA: Diagnosis not present

## 2023-01-20 ENCOUNTER — Other Ambulatory Visit: Payer: Self-pay

## 2023-01-20 DIAGNOSIS — M9901 Segmental and somatic dysfunction of cervical region: Secondary | ICD-10-CM | POA: Diagnosis not present

## 2023-01-20 DIAGNOSIS — S134XXA Sprain of ligaments of cervical spine, initial encounter: Secondary | ICD-10-CM | POA: Diagnosis not present

## 2023-01-20 DIAGNOSIS — H524 Presbyopia: Secondary | ICD-10-CM | POA: Diagnosis not present

## 2023-01-20 DIAGNOSIS — M9903 Segmental and somatic dysfunction of lumbar region: Secondary | ICD-10-CM | POA: Diagnosis not present

## 2023-01-20 DIAGNOSIS — M9902 Segmental and somatic dysfunction of thoracic region: Secondary | ICD-10-CM | POA: Diagnosis not present

## 2023-01-20 DIAGNOSIS — Z87442 Personal history of urinary calculi: Secondary | ICD-10-CM

## 2023-01-20 DIAGNOSIS — S233XXA Sprain of ligaments of thoracic spine, initial encounter: Secondary | ICD-10-CM | POA: Diagnosis not present

## 2023-01-20 DIAGNOSIS — S338XXA Sprain of other parts of lumbar spine and pelvis, initial encounter: Secondary | ICD-10-CM | POA: Diagnosis not present

## 2023-01-20 DIAGNOSIS — H52223 Regular astigmatism, bilateral: Secondary | ICD-10-CM | POA: Diagnosis not present

## 2023-01-20 MED ORDER — INDAPAMIDE 2.5 MG PO TABS
2.5000 mg | ORAL_TABLET | Freq: Every day | ORAL | 11 refills | Status: DC
Start: 2023-01-20 — End: 2023-08-16

## 2023-01-26 ENCOUNTER — Ambulatory Visit: Payer: Medicare HMO | Attending: Cardiology | Admitting: *Deleted

## 2023-01-26 ENCOUNTER — Other Ambulatory Visit: Payer: Self-pay

## 2023-01-26 DIAGNOSIS — Z5181 Encounter for therapeutic drug level monitoring: Secondary | ICD-10-CM

## 2023-01-26 DIAGNOSIS — I4821 Permanent atrial fibrillation: Secondary | ICD-10-CM | POA: Diagnosis not present

## 2023-01-26 LAB — POCT INR: INR: 2.8 (ref 2.0–3.0)

## 2023-01-26 NOTE — Telephone Encounter (Signed)
Requested Prescriptions   Pending Prescriptions Disp Refills   gabapentin (NEURONTIN) 100 MG capsule 90 capsule 5    Sig: Take 1 capsule (100 mg total) by mouth 3 (three) times daily.   Last seen 03/23/22, next appt not scheduled Dispenses   Dispensed Days Supply Quantity Provider Pharmacy  gabapentin 100 mg capsule 01/14/2023 30 90 capsule Levert Feinstein, MD [facility] Mail D...  GABAPENTIN 100MG     CAP 10/30/2022 30 90 each Levert Feinstein, MD [facility] [phone] ...  GABAPENTIN 100MG     CAP 06/07/2022 30 90 each Levert Feinstein, MD [facility] [phone] ...  GABAPENTIN 100MG     CAP 04/29/2022 30 90 each Levert Feinstein, MD [facility] [place] [facility] [phone] .Marland KitchenMarland Kitchen

## 2023-01-26 NOTE — Patient Instructions (Signed)
Continue warfarin 1/2 tablet daily except 1 tablet on Sundays, Tuesdays and Thursdays Recheck INR in 4 weeks. 

## 2023-01-28 ENCOUNTER — Encounter: Payer: Self-pay | Admitting: "Endocrinology

## 2023-01-28 ENCOUNTER — Ambulatory Visit (INDEPENDENT_AMBULATORY_CARE_PROVIDER_SITE_OTHER): Payer: Medicare HMO | Admitting: "Endocrinology

## 2023-01-28 VITALS — BP 138/62 | HR 52 | Ht 64.0 in | Wt 226.8 lb

## 2023-01-28 DIAGNOSIS — E782 Mixed hyperlipidemia: Secondary | ICD-10-CM | POA: Diagnosis not present

## 2023-01-28 DIAGNOSIS — Z794 Long term (current) use of insulin: Secondary | ICD-10-CM

## 2023-01-28 DIAGNOSIS — E1159 Type 2 diabetes mellitus with other circulatory complications: Secondary | ICD-10-CM

## 2023-01-28 DIAGNOSIS — I10 Essential (primary) hypertension: Secondary | ICD-10-CM | POA: Diagnosis not present

## 2023-01-28 MED ORDER — OZEMPIC (0.25 OR 0.5 MG/DOSE) 2 MG/1.5ML ~~LOC~~ SOPN: 0.2500 mg | PEN_INJECTOR | SUBCUTANEOUS | 1 refills | Status: DC

## 2023-01-28 MED ORDER — NOVOLOG FLEXPEN 100 UNIT/ML ~~LOC~~ SOPN: 10.0000 [IU] | PEN_INJECTOR | Freq: Three times a day (TID) | SUBCUTANEOUS | 2 refills | Status: DC

## 2023-01-28 NOTE — Patient Instructions (Signed)

## 2023-01-28 NOTE — Progress Notes (Signed)
01/28/2023, 7:25 PM   Endocrinology follow-up note  Subjective:    Patient ID: Rachel Odonnell, female    DOB: 1956-05-28.  Rachel Odonnell is being seen in follow-up after she was seen in consultation for  management of currently uncontrolled, symptomatic type 2 diabetes requested by  Rachel Shivers, PA-C.   Past Medical History:  Diagnosis Date   ADD (attention deficit disorder)    Diabetes mellitus without complication (HCC)    Hypertension     Past Surgical History:  Procedure Laterality Date   ABDOMINAL HYSTERECTOMY     COLONOSCOPY  02/2022   EXCISIONAL HEMORRHOIDECTOMY     INCONTINENCE SURGERY     RIGHT/LEFT HEART CATH AND CORONARY ANGIOGRAPHY N/A 06/13/2021   Procedure: RIGHT/LEFT HEART CATH AND CORONARY ANGIOGRAPHY;  Surgeon: [place], Peter M, MD;  Location: [facility] INVASIVE CV LAB;  Service: Cardiovascular;  Laterality: N/A;    Social History   Socioeconomic History   Marital status: Married    Spouse name: Rachel Odonnell   Number of children: Not on file   Years of education: Not on file   Highest education level: Not on file  Occupational History   Not on file  Tobacco Use   Smoking status: Former    Packs/day: 2    Types: Cigarettes    Quit date: 08/17/1994    Years since quitting: 28.4   Smokeless tobacco: Never  Vaping Use   Vaping Use: Never used  Substance and Sexual Activity   Alcohol use: No   Drug use: No   Sexual activity: Not on file  Other Topics Concern   Not on file  Social History Narrative   Lives at home with husband, Rachel Odonnell   Former smoker Smoked 2 ppd x 25 yrs; Quit in 1996   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, retail  retired Secondary school teacher, still works part-time at the [employer], used to work Editor, commissioning   Now on fixed income from her SSI and pension   Social Determinants of Health   Financial Resource Strain: Low Risk   (10/10/2021)   Overall Financial Resource Strain (CARDIA)    Difficulty of Paying Living Expenses: Not hard at all  Food Insecurity: No Food Insecurity (10/10/2021)   Hunger Vital Sign    Worried About Running Out of Food in the Last Year: Never true    Ran Out of Food in the Last Year: Never true  Transportation Needs: No Transportation Needs (10/10/2021)   PRAPARE - Administrator, Civil Service (Medical): No    Lack of Transportation (Non-Medical): No  Physical Activity: Unknown (04/18/2021)   Exercise Vital Sign    Days of Exercise per Week: 1 day    Minutes of Exercise per Session: Not on file  Stress: No Stress Concern Present (10/10/2021)   [employer] of Occupational Health - Occupational Stress Questionnaire    Feeling of Stress : Only a little  Social Connections: Socially Integrated (10/10/2021)   Social Connection and Isolation Panel [NHANES]    Frequency of Communication with Friends and Family: More than three times a week  Frequency of Social Gatherings with Friends and Family: More than three times a week    Attends Religious Services: More than 4 times per year    Active Member of [employer] or Organizations: Yes    Attends Engineer, structural: More than 4 times per year    Marital Status: Married    Family History  Problem Relation Age of Onset   Cancer Mother    Diabetes Father    Heart Problems Father    COPD Father    Emphysema Father    Heart block Father    Congestive Heart Failure Father     Outpatient Encounter Medications as of 01/28/2023  Medication Sig   insulin aspart (NOVOLOG FLEXPEN) 100 UNIT/ML FlexPen Inject 10-16 Units into the skin 3 (three) times daily before meals.   Semaglutide,0.25 or 0.5MG /DOS, (OZEMPIC, 0.25 OR 0.5 MG/DOSE,) 2 MG/1.5ML SOPN Inject 0.25 mg into the skin once a week.   Accu-Chek Softclix Lancets lancets Use as instructed to test blood glucose four times daily   busPIRone (BUSPAR) 5 MG tablet Take 5 mg  by mouth at bedtime.   cefUROXime (CEFTIN) 500 MG tablet Take 1 tablet (500 mg total) by mouth 2 (two) times daily with a meal. (Patient not taking: Reported on 12/23/2022)   Cholecalciferol (VITAMIN D3) 125 MCG (5000 UT) CAPS Take 1 capsule (5,000 Units total) by mouth daily.   citalopram (CELEXA) 40 MG tablet Take 40 mg by mouth every evening.   Continuous Blood Gluc Sensor (FREESTYLE LIBRE 2 SENSOR) MISC Change sensor every 14 days. Use to check glucose continuously   Continuous Glucose Receiver (FREESTYLE LIBRE 2 READER) DEVI USE TO CHECK GLUCOSE AS DIRECTED   donepezil (ARICEPT) 5 MG tablet SMARTSIG:1 Tablet(s) By Mouth Every Evening   estradiol (ESTRACE) 2 MG tablet Take 2 mg by mouth every evening.   fenofibrate (TRICOR) 145 MG tablet Take 145 mg by mouth every evening.   gabapentin (NEURONTIN) 100 MG capsule Take 1 capsule (100 mg total) by mouth 3 (three) times daily.   glipiZIDE (GLUCOTROL) 5 MG tablet Take 1 tablet (5 mg total) by mouth daily before breakfast.   glucose blood test strip 1 each by Other route as needed. Use as instructed to test blood glucose four times daily   indapamide (LOZOL) 2.5 MG tablet Take 1 tablet (2.5 mg total) by mouth daily.   insulin degludec (TRESIBA FLEXTOUCH) 200 UNIT/ML FlexTouch Pen Inject 90 Units into the skin at bedtime.   Insulin Pen Needle (B-D ULTRAFINE III SHORT PEN) 31G X 8 MM MISC 1 each by Does not apply route as directed.   isosorbide mononitrate (IMDUR) 60 MG 24 hr tablet Take 1 tablet (60 mg total) by mouth daily.   levocetirizine (XYZAL) 5 MG tablet Take 5 mg by mouth every evening.   levothyroxine (SYNTHROID) 50 MCG tablet Take 1 tablet (50 mcg total) by mouth daily before breakfast.   metFORMIN (GLUCOPHAGE) 500 MG tablet Take 1 tablet (500 mg total) by mouth 2 (two) times daily with a meal.   modafinil (PROVIGIL) 100 MG tablet Take 1 tablet by mouth once daily   montelukast (SINGULAIR) 10 MG tablet Take 10 mg by mouth at bedtime.    Multiple Vitamin (MULTIVITAMIN ADULT PO) Take 1 tablet by mouth daily.   Omega-3 Fatty Acids (FISH OIL) 1000 MG CAPS Take 1,000 mg by mouth 2 (two) times daily. Morning and evening   omeprazole (PRILOSEC) 40 MG capsule Take 40 mg by mouth every evening.  rosuvastatin (CRESTOR) 20 MG tablet Take 1 tablet (20 mg total) by mouth daily. (Patient taking differently: Take 40 mg by mouth daily.)   warfarin (COUMADIN) 5 MG tablet Take 1/2 a tablet to 1 tablet by mouth daily as directed by the coumadin clinic.   No facility-administered encounter medications on file as of 01/28/2023.    ALLERGIES: Allergies  Allergen Reactions   Azithromycin Hives   Codeine Nausea Only   Atomoxetine Nausea And Vomiting   Atorvastatin Other (See Comments)    Restless legs   Lisinopril Nausea And Vomiting    VACCINATION STATUS: Immunization History  Administered Date(s) Administered   Fluad Quad(high Dose 65+) 05/14/2021   Moderna SARS-COV2 Booster Vaccination 06/13/2020   Moderna Sars-Covid-2 Vaccination 09/06/2019, 10/04/2019   Pneumococcal Polysaccharide-23 04/22/2017   Pneumococcal-Unspecified 05/31/2020   Tdap 04/22/2017   Zoster Recombinat (Shingrix) 11/30/2017, 02/20/2018    Diabetes She presents for her follow-up diabetic visit. She has type 2 diabetes mellitus. Onset time: Patient was diagnosed at approximate age of 49 years. Her disease course has been worsening. There are no hypoglycemic associated symptoms. Pertinent negatives for hypoglycemia include no confusion, headaches, pallor or seizures. Pertinent negatives for diabetes include no blurred vision, no chest pain, no fatigue, no polydipsia, no polyphagia and no polyuria. There are no hypoglycemic complications. Symptoms are worsening. Diabetic complications include heart disease and peripheral neuropathy. Risk factors for coronary artery disease include dyslipidemia, diabetes mellitus, family history, obesity, post-menopausal, sedentary  lifestyle, tobacco exposure and hypertension. Current diabetic treatment includes insulin injections and oral agent (dual therapy) (.). Her weight is increasing steadily. She is following a generally unhealthy diet. When asked about meal planning, she reported none. She never participates in exercise. Her home blood glucose trend is increasing steadily. Her breakfast blood glucose range is generally >200 mg/dl. Her lunch blood glucose range is generally >200 mg/dl. Her dinner blood glucose range is generally >200 mg/dl. Her bedtime blood glucose range is generally >200 mg/dl. Her overall blood glucose range is >200 mg/dl. (She presents with her CGM on her phone.  Her AGP report shows 2% time in range, 20% level 1 hyperglycemia, 78% Libre to hyperglycemia.  Her average blood glucose is 298 over the last 14 days.  She did not document any hypoglycemia.  Her recent A1c was 10.4%. Patient failed to call clinic for hyperglycemia.  ) An ACE inhibitor/angiotensin II receptor blocker is not being taken. Eye exam is current.  Hyperlipidemia This is a chronic problem. The current episode started more than 1 year ago. The problem is uncontrolled. Exacerbating diseases include diabetes, hypothyroidism and obesity. Pertinent negatives include no chest pain, myalgias or shortness of breath. Current antihyperlipidemic treatment includes statins, fibric acid derivatives and bile acid sequestrants. Risk factors for coronary artery disease include family history, dyslipidemia, obesity, a sedentary lifestyle, post-menopausal, hypertension and diabetes mellitus.  Hypertension This is a chronic problem. The current episode started more than 1 year ago. Pertinent negatives include no blurred vision, chest pain, headaches, palpitations or shortness of breath. Risk factors for coronary artery disease include dyslipidemia, diabetes mellitus, family history, obesity, post-menopausal state, smoking/tobacco exposure and sedentary  lifestyle. Past treatments include direct vasodilators. Hypertensive end-organ damage includes CAD/MI.    Review of systems  Constitutional: + Minimally fluctuating body weight ,  current Body mass index is 38.93 kg/m. , no fatigue, no subjective hyperthermia, no subjective hypothermia Eyes: no blurry vision, no xerophthalmia    Objective:    BP 138/62   Pulse (!) 52  Ht 5\' 4"  (1.626 m)   Wt 226 lb 12.8 oz (102.9 kg)   BMI 38.93 kg/m   Wt Readings from Last 3 Encounters:  01/28/23 226 lb 12.8 oz (102.9 kg)  12/23/22 225 lb 12.8 oz (102.4 kg)  12/01/22 228 lb (103.4 kg)    BP Readings from Last 3 Encounters:  01/28/23 138/62  12/23/22 131/63  12/01/22 (!) 130/58     Physical Exam- Limited  Constitutional:  Body mass index is 38.93 kg/m. , not in acute distress, normal state of mind Eyes:  EOMI, no exophthalmos    CMP ( most recent) CMP     Component Value Date/Time   NA 137 08/26/2022 1455   K 4.8 08/26/2022 1455   CL 99 08/26/2022 1455   CO2 22 08/26/2022 1455   GLUCOSE 242 (H) 08/26/2022 1455   GLUCOSE 338 (H) 06/11/2021 1331   BUN 11 08/26/2022 1455   CREATININE 0.90 08/26/2022 1455   CALCIUM 10.1 08/26/2022 1455   PROT 7.6 07/28/2022 1509   ALBUMIN 4.6 07/28/2022 1509   AST 41 (H) 07/28/2022 1509   ALT 28 07/28/2022 1509   ALKPHOS 92 07/28/2022 1509   BILITOT 0.4 07/28/2022 1509   GFRNONAA >60 06/11/2021 1331   GFRAA >90 07/09/2012 1736    Recent Results (from the past 2160 hour(s))  Urinalysis, Routine w reflex microscopic     Status: Abnormal   Collection Time: 11/04/22  2:08 PM  Result Value Ref Range   Specific Gravity, UA 1.030 1.005 - 1.030   pH, UA 5.5 5.0 - 7.5   Color, UA Yellow Yellow   Appearance Ur Cloudy (A) Clear   Leukocytes,UA 3+ (A) Negative   Protein,UA 1+ (A) Negative/Trace   Glucose, UA Negative Negative   Ketones, UA Negative Negative   RBC, UA 1+ (A) Negative   Bilirubin, UA Negative Negative   Urobilinogen, Ur 1.0  0.2 - 1.0 mg/dL   Nitrite, UA Positive (A) Negative   Microscopic Examination See below:     Comment: Microscopic was indicated and was performed.  Microscopic Examination     Status: Abnormal   Collection Time: 11/04/22  2:08 PM   Urine  Result Value Ref Range   WBC, UA >30 (A) 0 - 5 /hpf   RBC, Urine 3-10 (A) 0 - 2 /hpf   Epithelial Cells (non renal) 0-10 0 - 10 /hpf   Bacteria, UA Many (A) None seen/Few  Urine Culture     Status: Abnormal   Collection Time: 11/04/22  2:43 PM   Specimen: Urine   UR  Result Value Ref Range   Urine Culture, Routine Final report (A)    Organism ID, Bacteria Escherichia coli (A)     Comment: Cefazolin <=4 ug/mL Cefazolin with an MIC <=16 predicts susceptibility to the oral agents cefaclor, cefdinir, cefpodoxime, cefprozil, cefuroxime, cephalexin, and loracarbef when used for therapy of uncomplicated urinary tract infections due to E. coli, Klebsiella pneumoniae, and Proteus mirabilis. Greater than 100,000 colony forming units per mL    Antimicrobial Susceptibility Comment     Comment:       ** S = Susceptible; I = Intermediate; R = Resistant **                    P = Positive; N = Negative             MICS are expressed in micrograms per mL    Antibiotic  RSLT#1    RSLT#2    RSLT#3    RSLT#4 Amoxicillin/Clavulanic Acid    S Ampicillin                     S Cefepime                       S Ceftriaxone                    S Cefuroxime                     S Ciprofloxacin                  S Ertapenem                      S Gentamicin                     S Imipenem                       S Levofloxacin                   S Meropenem                      S Nitrofurantoin                 S Piperacillin/Tazobactam        S Tetracycline                   S Tobramycin                     S Trimethoprim/Sulfa             S   Urine Culture     Status: None   Collection Time: 11/10/22  1:27 PM   Specimen: Urine   UR  Result Value Ref  Range   Urine Culture, Routine Final report    Organism ID, Bacteria Comment     Comment: Mixed urogenital flora 10,000-25,000 colony forming units per mL   POCT INR     Status: Abnormal   Collection Time: 11/18/22  2:57 PM  Result Value Ref Range   INR 1.9 (A) 2.0 - 3.0   POC INR    HgB A1c     Status: Abnormal   Collection Time: 12/01/22  2:48 PM  Result Value Ref Range   Hemoglobin A1C     HbA1c POC (<> result, manual entry)     HbA1c, POC (prediabetic range)     HbA1c, POC (controlled diabetic range) 10.4 (A) 0.0 - 7.0 %  POCT INR     Status: Abnormal   Collection Time: 12/15/22  3:33 PM  Result Value Ref Range   INR 4.3 (A) 2.0 - 3.0   POC INR    POCT INR     Status: Normal   Collection Time: 12/29/22  3:27 PM  Result Value Ref Range   INR 2.5 2.0 - 3.0   POC INR    POCT INR     Status: Normal   Collection Time: 01/26/23  3:03 PM  Result Value Ref Range   INR 2.8 2.0 - 3.0   POC INR       Assessment & Plan:   1) DM type 2 causing vascular disease (HCC)  -  LAURIAN MANCHEGO has currently uncontrolled symptomatic type 2 DM since  67 years of age.  She presents with her CGM on her phone.  Her AGP report shows 2% time in range, 20% level 1 hyperglycemia, 78% Libre to hyperglycemia.  Her average blood glucose is 298 over the last 14 days.  She did not document any hypoglycemia.  Her recent A1c was 10.4%. Patient failed to call clinic for hyperglycemia despite the fact that she was advised to call clinic or hyperglycemia above 200 mg/day.   Recent labs reviewed.  - I had a long discussion with her about the progressive nature of diabetes and the pathology behind its complications. -her diabetes is complicated by coronary artery disease, peripheral neuropathy, obesity, sedentary life, nonalcoholic fatty liver disease and she remains at extremely  high risk for more acute and chronic complications which include CAD, CVA, CKD, retinopathy, and neuropathy. These are all  discussed in detail with her.  - I discussed all available options of managing her diabetes including de-escalation of medications.  Considering her associated comorbidities including sleep apnea requiring CPAP, nonalcoholic fatty liver disease, hypertension, hyperlipidemia, she is a perfect candidate for lifestyle medicine.    I have counseled her on diet  and weight management  by adopting a Whole Food , Plant Predominant  ( WFPP) nutrition as recommended by [employer] of Lifestyle Medicine. Patient is encouraged to switch to  unprocessed or minimally processed  complex starch, adequate protein intake (mainly plant source), minimal liquid fat ( mainly vegetable oils), plenty of fruits, and vegetables. -  she is advised to stick to a routine mealtimes to eat 3 complete meals a day and snack only when necessary ( to snack only to correct hypoglycemia BG <70 day time or <100 at night).   -Her engagement with lifestyle medicine nutrition is suboptimal, however,  she acknowledges that there is a room for improvement in her food and drink choices. - Suggestion is made for her to avoid simple carbohydrates  from her diet including Cakes, Sweet Desserts, Ice Cream, Soda (diet and regular), Sweet Tea, Candies, Chips, Cookies, Store Bought Juices, Alcohol , Artificial Sweeteners,  Coffee Creamer, and "Sugar-free" Products, Lemonade. This will help patient to have more stable blood glucose profile and potentially avoid unintended weight gain.  The following Lifestyle Medicine recommendations according to American College of Lifestyle Medicine  [facility]) were discussed and and offered to patient and she  agrees to start the journey:  A. Whole Foods, Plant-Based Nutrition comprising of fruits and vegetables, plant-based proteins, whole-grain carbohydrates was discussed in detail with the patient.   A list for source of those nutrients were also provided to the patient.  Patient will use only water or unsweetened  tea for hydration. B.  The need to stay away from risky substances including alcohol, smoking; obtaining 7 to 9 hours of restorative sleep, at least 150 minutes of moderate intensity exercise weekly, the importance of healthy social connections,  and stress management techniques were discussed. C.  A full color page of  Calorie density of various food groups per pound showing examples of each food groups was provided to the patient.    - she has been scheduled with Norm Salt, RDN, CDE for individualized diabetes education.  - I have approached her with the following plan to manage  her diabetes and patient agrees:   In light of her presentation with current and prevailing hyperglycemic burden, she is approached from intensive treatment with basal/bolus insulin. -  Accordingly, she is advised to continue Tresiba 90 units nightly, discussed and initiated NovoLog 10-16 units 3 times daily is for Premeal blood glucose readings above 90 mg per DL.  She is advised to continue metformin 500 mg p.o. daily, glipizide 5 mg p.o. daily until she finishes her current supplies.  This medication will be discontinued off subsequently.  She is interested in GLP-1 receptor agonist.  I discussed and initiated Ozempic 0.25 mg subcutaneously weekly.  This medication was advised as tolerated.  -She did not tolerate Jardiance in a prior attempt.  - Specific targets for  A1c;  LDL, HDL,  and Triglycerides were discussed with the patient.  2) Blood Pressure /Hypertension:   -Her blood pressure is controlled to target.  she is advised to continue her current medications including Imdur 60 mg p.o. daily with breakfast .  3) Lipids/Hyperlipidemia:    Her most recent lipid panel from 07/28/22 shows uncontrolled LDL of 120 and significantly elevated triglycerides of 359.  She is advised to continue on her current triple agent therapy including fenofibrate 145 mg p.o. daily, 35 acids 1000 mg p.o. twice daily, Crestor 10 mg  p.o. daily at bedtime.   She has a chance to simplify this treatment with proper engagement in lifestyle medicine.  She is hesitant to engage with lifestyle medicine for now.  Side effects and precautions discussed with her.  4)  Weight/Diet:   Her Body mass index is 38.93 kg/m.  -   clearly complicating her diabetes care.   she is  a candidate for weight loss. I discussed with her the fact that loss of 5 - 10% of her  current body weight will have the most impact on her diabetes management.  The above detailed  ACLM recommendations for nutrition, exercise, sleep, social life, avoidance of risky substances, the need for restorative sleep   information will also detailed on discharge instructions.  5) Hypothyroidism:  The circumstances of her diagnosis are not available to review.  Her previsit thyroid function tests are consistent with appropriate hormone replacement.  She is advised to continue Levothyroxine 50 mcg p.o. daily before breakfast.    - We discussed about the correct intake of her thyroid hormone, on empty stomach at fasting, with water, separated by at least 30 minutes from breakfast and other medications,  and separated by more than 4 hours from calcium, iron, multivitamins, acid reflux medications (PPIs). -Patient is made aware of the fact that thyroid hormone replacement is needed for life, dose to be adjusted by periodic monitoring of thyroid function tests.   6) Chronic Care/Health Maintenance: -she is on Statin medications and  is encouraged to initiate and continue to follow up with Ophthalmology, Dentist,  Podiatrist at least yearly or according to recommendations, and advised to   stay away from smoking. I have recommended yearly flu vaccine and pneumonia vaccine at least every 5 years; moderate intensity exercise for up to 150 minutes weekly; and  sleep for 7- 9 hours a day.  -She will need to have vitamin D supplement with vitamin D3 5000 units daily x90 days.  - she is  advised to maintain close follow up with Rachel Shivers, PA-C for primary care needs, as well as her other providers for optimal and coordinated care.    I spent  41  minutes in the care of the patient today including review of labs from CMP, Lipids, Thyroid Function, Hematology (current and previous including abstractions from other facilities); face-to-face time discussing  her blood glucose readings/logs, discussing hypoglycemia and hyperglycemia episodes and symptoms, medications doses, her options of short and long term treatment based on the latest standards of care / guidelines;  discussion about incorporating lifestyle medicine;  and documenting the encounter. Risk reduction counseling performed per USPSTF guidelines to reduce  obesity and cardiovascular risk factors.     Please refer to Patient Instructions for Blood Glucose Monitoring and Insulin/Medications Dosing Guide"  in media tab for additional information. Please  also refer to " Patient Self Inventory" in the Media  tab for reviewed elements of pertinent patient history.  Shawneen Deetz Lindholm participated in the discussions, expressed understanding, and voiced agreement with the above plans.  All questions were answered to her satisfaction. she is encouraged to contact clinic should she have any questions or concerns prior to her return visit.    Follow up plan: - Return in about 5 weeks (around 03/04/2023) for Bring Meter/CGM Device/Logs- A1c in Office.  Ronny Bacon, [facility] [address] [place], [place] [address] Phone: [phone] Fax: [phone]  01/28/2023, 7:25 PM

## 2023-01-29 ENCOUNTER — Ambulatory Visit (HOSPITAL_COMMUNITY)
Admission: RE | Admit: 2023-01-29 | Discharge: 2023-01-29 | Disposition: A | Discharge status: Home / Self Care | Payer: Medicare HMO | Source: Ambulatory Visit | Attending: Urology | Admitting: Urology

## 2023-01-29 DIAGNOSIS — N200 Calculus of kidney: Secondary | ICD-10-CM | POA: Insufficient documentation

## 2023-01-29 DIAGNOSIS — Z87442 Personal history of urinary calculi: Secondary | ICD-10-CM | POA: Diagnosis present

## 2023-02-02 ENCOUNTER — Ambulatory Visit: Payer: Medicare HMO | Admitting: Urology

## 2023-02-02 DIAGNOSIS — Z87442 Personal history of urinary calculi: Secondary | ICD-10-CM

## 2023-02-03 ENCOUNTER — Other Ambulatory Visit: Payer: Self-pay

## 2023-02-03 MED ORDER — NOVOLOG FLEXPEN 100 UNIT/ML ~~LOC~~ SOPN: 10.0000 [IU] | PEN_INJECTOR | Freq: Three times a day (TID) | SUBCUTANEOUS | 0 refills | Status: DC

## 2023-02-03 MED ORDER — OZEMPIC (0.25 OR 0.5 MG/DOSE) 2 MG/1.5ML ~~LOC~~ SOPN: 0.2500 mg | PEN_INJECTOR | SUBCUTANEOUS | 0 refills | Status: DC

## 2023-02-08 ENCOUNTER — Other Ambulatory Visit: Payer: Self-pay

## 2023-02-08 ENCOUNTER — Telehealth: Payer: Self-pay | Admitting: "Endocrinology

## 2023-02-08 ENCOUNTER — Telehealth: Payer: Self-pay

## 2023-02-08 DIAGNOSIS — E1159 Type 2 diabetes mellitus with other circulatory complications: Secondary | ICD-10-CM

## 2023-02-08 MED ORDER — TRESIBA FLEXTOUCH 200 UNIT/ML ~~LOC~~ SOPN
100.0000 [IU] | PEN_INJECTOR | Freq: Every day | SUBCUTANEOUS | 0 refills | Status: DC
Start: 2023-02-08 — End: 2023-02-16

## 2023-02-08 MED ORDER — NOVOLOG FLEXPEN 100 UNIT/ML ~~LOC~~ SOPN
10.0000 [IU] | PEN_INJECTOR | Freq: Three times a day (TID) | SUBCUTANEOUS | 0 refills | Status: DC
Start: 2023-02-08 — End: 2023-06-08

## 2023-02-08 NOTE — Telephone Encounter (Signed)
Tried to call pt but did not receive an answer and was unable to leave a message. 

## 2023-02-08 NOTE — Telephone Encounter (Signed)
Pt called and stated she left her insulin at home and is on vacation at [facility].  She needs a prescription called into [facility] in [place], [place]

## 2023-02-08 NOTE — Telephone Encounter (Signed)
Spoke with pt she stated her glucose had been between [phone] and occasionally registering HI on her CGM for the past 3 days. Discussed with Dr.Nida.  Advised pt to increase her tresiba to 100 units at bedtime and to start novolog SS 10-16 units tid with meals per Dr.Nida's orders. Pt voiced understanding. Rx for tresiba 100 units at bedtime and novolog 10-16 units tid sent to [facility] in [place], [place] as requested. Pt aware.

## 2023-02-09 ENCOUNTER — Telehealth: Payer: Self-pay

## 2023-02-09 NOTE — Telephone Encounter (Signed)
Left a message requesting pt return call to the office. 

## 2023-02-14 DIAGNOSIS — E1165 Type 2 diabetes mellitus with hyperglycemia: Secondary | ICD-10-CM | POA: Diagnosis not present

## 2023-02-14 DIAGNOSIS — I4891 Unspecified atrial fibrillation: Secondary | ICD-10-CM | POA: Diagnosis not present

## 2023-02-14 DIAGNOSIS — E782 Mixed hyperlipidemia: Secondary | ICD-10-CM | POA: Diagnosis not present

## 2023-02-15 DIAGNOSIS — E1165 Type 2 diabetes mellitus with hyperglycemia: Secondary | ICD-10-CM | POA: Diagnosis not present

## 2023-02-15 NOTE — Progress Notes (Unsigned)
Rachel Odonnell 08-26-1955 [id number]  History of Present Illness: [name] is a 67 y.o. female who presents today for follow up visit at [facility] [place]. - GU History: 1. Kidney stones. - She has had ESWL twice, 2 ureteroscopies, and she has passed 3 other calculi.  - 09/22/2022: 24 hour urine shows good urine volume, high calcium, high uric acid.   At last visit with Dr. Ronne Binning on 11/04/2022: - For stones: Started indapamide 2.5mg  daily. Advised follow up 3 months with renal US. - For acute UTI: Treated with Ceftin 500mg  BID for 7 days. Urine culture positive for E. Coli.   Since last visit: - 11/10/2022: Repeat urine culture was negative. - 01/29/2023: Renal US showed "Bilateral nonobstructing renal stones. No other abnormalities."  Today: She {Actions; denies-reports:120008} recent episode of stone pain / passage. She {Actions; denies-reports:120008} acute flank pain / abdominal pain. She {Actions; denies-reports:120008} fevers.  She {Actions; denies-reports:120008} nausea/ vomiting.  She urinates *** times per day. She {Actions; denies-reports:120008} urgency. She {Actions; denies-reports:120008} dysuria. She {Actions; denies-reports:120008} gross hematuria. She {Actions; denies-reports:120008} the need to strain to void. She {Actions; denies-reports:120008} sensations of incomplete emptying.   Fall Screening: Do you usually have a device to assist in your mobility? {yes/no:20286}  ***cane / ***walker / ***wheelchair  Medications: Current Outpatient Medications  Medication Sig Dispense Refill   Accu-Chek Softclix Lancets lancets Use as instructed to test blood glucose four times daily 100 each 2   busPIRone (BUSPAR) 5 MG tablet Take 5 mg by mouth at bedtime.     cefUROXime (CEFTIN) 500 MG tablet Take 1 tablet (500 mg total) by mouth 2 (two) times daily with a meal. (Patient not taking: Reported on 12/23/2022) 14 tablet 0   Cholecalciferol (VITAMIN D3) 125 MCG  (5000 UT) CAPS Take 1 capsule (5,000 Units total) by mouth daily. 90 capsule 0   citalopram (CELEXA) 40 MG tablet Take 40 mg by mouth every evening.     Continuous Blood Gluc Sensor (FREESTYLE LIBRE 2 SENSOR) MISC Change sensor every 14 days. Use to check glucose continuously 2 each 0   Continuous Glucose Receiver (FREESTYLE LIBRE 2 READER) DEVI USE TO CHECK GLUCOSE AS DIRECTED 1 each 0   donepezil (ARICEPT) 5 MG tablet SMARTSIG:1 Tablet(s) By Mouth Every Evening     estradiol (ESTRACE) 2 MG tablet Take 2 mg by mouth every evening.     fenofibrate (TRICOR) 145 MG tablet Take 145 mg by mouth every evening.     gabapentin (NEURONTIN) 100 MG capsule Take 1 capsule (100 mg total) by mouth 3 (three) times daily. 90 capsule 5   glipiZIDE (GLUCOTROL) 5 MG tablet Take 1 tablet (5 mg total) by mouth daily before breakfast. 90 tablet 1   glucose blood test strip 1 each by Other route as needed. Use as instructed to test blood glucose four times daily 100 each 2   indapamide (LOZOL) 2.5 MG tablet Take 1 tablet (2.5 mg total) by mouth daily. 30 tablet 11   insulin aspart (NOVOLOG FLEXPEN) 100 UNIT/ML FlexPen Inject 10-16 Units into the skin 3 (three) times daily before meals. 15 mL 0   insulin degludec (TRESIBA FLEXTOUCH) 200 UNIT/ML FlexTouch Pen Inject 100 Units into the skin at bedtime. 6 mL 0   Insulin Pen Needle (B-D ULTRAFINE III SHORT PEN) 31G X 8 MM MISC 1 each by Does not apply route as directed. 100 each 2   isosorbide mononitrate (IMDUR) 60 MG 24 hr tablet Take 1 tablet (  60 mg total) by mouth daily. 90 tablet 3   levocetirizine (XYZAL) 5 MG tablet Take 5 mg by mouth every evening.     levothyroxine (SYNTHROID) 50 MCG tablet Take 1 tablet (50 mcg total) by mouth daily before breakfast. 90 tablet 0   metFORMIN (GLUCOPHAGE) 500 MG tablet Take 1 tablet (500 mg total) by mouth 2 (two) times daily with a meal. 180 tablet 1   modafinil (PROVIGIL) 100 MG tablet Take 1 tablet by mouth once daily 30 tablet 0    montelukast (SINGULAIR) 10 MG tablet Take 10 mg by mouth at bedtime.     Multiple Vitamin (MULTIVITAMIN ADULT PO) Take 1 tablet by mouth daily.     Omega-3 Fatty Acids (FISH OIL) 1000 MG CAPS Take 1,000 mg by mouth 2 (two) times daily. Morning and evening     omeprazole (PRILOSEC) 40 MG capsule Take 40 mg by mouth every evening.     rosuvastatin (CRESTOR) 20 MG tablet Take 1 tablet (20 mg total) by mouth daily. (Patient taking differently: Take 40 mg by mouth daily.) 90 tablet 3   Semaglutide,0.25 or 0.5MG /DOS, (OZEMPIC, 0.25 OR 0.5 MG/DOSE,) 2 MG/1.5ML SOPN Inject 0.25 mg into the skin once a week. 4.5 mL 0   warfarin (COUMADIN) 5 MG tablet Take 1/2 a tablet to 1 tablet by mouth daily as directed by the coumadin clinic. 90 tablet 0   No current facility-administered medications for this visit.    Allergies: Allergies  Allergen Reactions   Azithromycin Hives   Codeine Nausea Only   Atomoxetine Nausea And Vomiting   Atorvastatin Other (See Comments)    Restless legs   Lisinopril Nausea And Vomiting    Past Medical History:  Diagnosis Date   ADD (attention deficit disorder)    Diabetes mellitus without complication (HCC)    Hypertension    Past Surgical History:  Procedure Laterality Date   ABDOMINAL HYSTERECTOMY     COLONOSCOPY  02/2022   EXCISIONAL HEMORRHOIDECTOMY     INCONTINENCE SURGERY     RIGHT/LEFT HEART CATH AND CORONARY ANGIOGRAPHY N/A 06/13/2021   Procedure: RIGHT/LEFT HEART CATH AND CORONARY ANGIOGRAPHY;  Surgeon: [place], Peter M, MD;  Location: [facility]IVE [facility];  Service: Cardiovascular;  Laterality: N/A;   Family History  Problem Relation Age of Onset   Cancer Mother    Diabetes Father    Heart Problems Father    COPD Father    Emphysema Father    Heart block Father    Congestive Heart Failure Father    Social History   Socioeconomic History   Marital status: Married    Spouse name: Rachel Odonnell   Number of children: Not on file   Years of education: Not  on file   Highest education level: Not on file  Occupational History   Not on file  Tobacco Use   Smoking status: Former    Packs/day: 2    Types: Cigarettes    Quit date: 08/17/1994    Years since quitting: 28.5   Smokeless tobacco: Never  Vaping Use   Vaping Use: Never used  Substance and Sexual Activity   Alcohol use: No   Drug use: No   Sexual activity: Not on file  Other Topics Concern   Not on file  Social History Narrative   Lives at home with husband, Rachel Odonnell   Former smoker Smoked 2 ppd x 25 yrs; Quit in 1996   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, retail  retired Secondary school teacher, still works part-time at the [employer], used to work Editor, commissioning   Now on fixed income from her SSI and pension   Social Determinants of Health   Financial Resource Strain: Low Risk  (10/10/2021)   Overall Financial Resource Strain (CARDIA)    Difficulty of Paying Living Expenses: Not hard at all  Food Insecurity: No Food Insecurity (10/10/2021)   Hunger Vital Sign    Worried About Running Out of Food in the Last Year: Never true    Ran Out of Food in the Last Year: Never true  Transportation Needs: No Transportation Needs (10/10/2021)   PRAPARE - Administrator, Civil Service (Medical): No    Lack of Transportation (Non-Medical): No  Physical Activity: Unknown (04/18/2021)   Exercise Vital Sign    Days of Exercise per Week: 1 day    Minutes of Exercise per Session: Not on file  Stress: No Stress Concern Present (10/10/2021)   [employer] of Occupational Health - Occupational Stress Questionnaire    Feeling of Stress : Only a little  Social Connections: Socially Integrated (10/10/2021)   Social Connection and Isolation Panel [NHANES]    Frequency of Communication with Friends and Family: More than three times a week    Frequency of Social Gatherings with Friends and Family: More than three times a week    Attends Religious Services: More than 4  times per year    Active Member of [employer] or Organizations: Yes    Attends Engineer, structural: More than 4 times per year    Marital Status: Married  Catering manager Violence: Not At Risk (10/10/2021)   Humiliation, Afraid, Rape, and Kick questionnaire    Fear of Current or Ex-Partner: No    Emotionally Abused: No    Physically Abused: No    Sexually Abused: No    SUBJECTIVE  Review of Systems Constitutional: Patient ***denies any unintentional weight loss or change in strength lntegumentary: Patient ***denies any rashes or pruritus Eyes: Patient denies ***dry eyes ENT: Patient ***denies dry mouth Cardiovascular: Patient ***denies chest pain or syncope Respiratory: Patient ***denies shortness of breath Gastrointestinal: Patient ***denies nausea, vomiting, constipation, or diarrhea Musculoskeletal: Patient ***denies muscle cramps or weakness Neurologic: Patient ***denies convulsions or seizures Psychiatric: Patient ***denies memory problems Allergic/Immunologic: Patient ***denies recent allergic reaction(s) Hematologic/Lymphatic: Patient denies bleeding tendencies Endocrine: Patient ***denies heat/cold intolerance  GU: As per HPI.  OBJECTIVE There were no vitals filed for this visit. There is no height or weight on file to calculate BMI.  Physical Examination  Constitutional: ***No obvious distress; patient is ***non-toxic appearing  Cardiovascular: ***No visible lower extremity edema.  Respiratory: The patient does ***not have audible wheezing/stridor; respirations do ***not appear labored  Gastrointestinal: Abdomen ***non-distended Musculoskeletal: ***Normal ROM of UEs  Skin: ***No obvious rashes/open sores  Neurologic: CN 2-12 grossly ***intact Psychiatric: Answered questions ***appropriately with ***normal affect  Hematologic/Lymphatic/Immunologic: ***No obvious bruises or sites of spontaneous bleeding  UA: {Desc; negative/positive:13464} *** WBC/hpf, ***  RBC/hpf, bacteria (***) *** nitrites, *** leukocytes, *** blood PVR: *** ml  ASSESSMENT No diagnosis found.  ***We reviewed recent imaging results; ***awaiting radiology results, appears to have ***no acute findings.  ***For stone prevention: Advised adequate hydration and we discussed option to consider low oxalate diet given that calcium oxalate is the most common type of stone. Handout provided about stone prevention diet.  ***For recurrent stone formers: We discussed option to proceed with 24 hour urinalysis ([facility]) for metabolic evaluation, which may  help with targeted recommendations for dietary I medication therapies for stone prevention. Patient elected to ***proceed/ ***hold off.  Will plan to follow up in ***6 months / ***1 year with ***KUB ***RUS for stone surveillance or sooner if needed.  Pt verbalized understanding and agreement. All questions were answered.  PLAN Advised the following: ***Maintain adequate fluid intake. ***Low oxalate diet. No follow-ups on file.  No orders of the defined types were placed in this encounter.   It has been explained that the patient is to follow regularly with their PCP in addition to all other providers involved in their care and to follow instructions provided by these respective offices. Patient advised to contact urology clinic if any urologic-pertaining questions, concerns, new symptoms or problems arise in the interim period.  There are no Patient Instructions on file for this visit.  Electronically signed by:  Donnita Falls, MSN, FNP-C, CUNP 02/15/2023 5:56 PM

## 2023-02-16 ENCOUNTER — Emergency Department (HOSPITAL_COMMUNITY): Payer: Medicare HMO

## 2023-02-16 ENCOUNTER — Other Ambulatory Visit: Payer: Self-pay

## 2023-02-16 ENCOUNTER — Encounter: Payer: Self-pay | Admitting: Urology

## 2023-02-16 ENCOUNTER — Emergency Department (HOSPITAL_COMMUNITY)
Admission: EM | Admit: 2023-02-16 | Discharge: 2023-02-17 | Disposition: A | Discharge status: Home / Self Care | Payer: Medicare HMO | Attending: Emergency Medicine | Admitting: Emergency Medicine

## 2023-02-16 ENCOUNTER — Encounter (HOSPITAL_COMMUNITY): Payer: Self-pay | Admitting: *Deleted

## 2023-02-16 ENCOUNTER — Ambulatory Visit (INDEPENDENT_AMBULATORY_CARE_PROVIDER_SITE_OTHER): Payer: Medicare HMO | Admitting: Urology

## 2023-02-16 VITALS — BP 135/74 | HR 58 | Temp 98.0°F

## 2023-02-16 DIAGNOSIS — E119 Type 2 diabetes mellitus without complications: Secondary | ICD-10-CM | POA: Diagnosis not present

## 2023-02-16 DIAGNOSIS — Z79899 Other long term (current) drug therapy: Secondary | ICD-10-CM | POA: Insufficient documentation

## 2023-02-16 DIAGNOSIS — Z794 Long term (current) use of insulin: Secondary | ICD-10-CM | POA: Diagnosis not present

## 2023-02-16 DIAGNOSIS — M9903 Segmental and somatic dysfunction of lumbar region: Secondary | ICD-10-CM | POA: Diagnosis not present

## 2023-02-16 DIAGNOSIS — Z87448 Personal history of other diseases of urinary system: Secondary | ICD-10-CM | POA: Diagnosis not present

## 2023-02-16 DIAGNOSIS — S233XXA Sprain of ligaments of thoracic spine, initial encounter: Secondary | ICD-10-CM | POA: Diagnosis not present

## 2023-02-16 DIAGNOSIS — R0989 Other specified symptoms and signs involving the circulatory and respiratory systems: Secondary | ICD-10-CM

## 2023-02-16 DIAGNOSIS — N209 Urinary calculus, unspecified: Secondary | ICD-10-CM | POA: Diagnosis not present

## 2023-02-16 DIAGNOSIS — G319 Degenerative disease of nervous system, unspecified: Secondary | ICD-10-CM | POA: Diagnosis not present

## 2023-02-16 DIAGNOSIS — R001 Bradycardia, unspecified: Secondary | ICD-10-CM | POA: Diagnosis present

## 2023-02-16 DIAGNOSIS — R42 Dizziness and giddiness: Secondary | ICD-10-CM | POA: Diagnosis not present

## 2023-02-16 DIAGNOSIS — Z960 Presence of urogenital implants: Secondary | ICD-10-CM

## 2023-02-16 DIAGNOSIS — R3915 Urgency of urination: Secondary | ICD-10-CM | POA: Insufficient documentation

## 2023-02-16 DIAGNOSIS — Z7984 Long term (current) use of oral hypoglycemic drugs: Secondary | ICD-10-CM | POA: Diagnosis not present

## 2023-02-16 DIAGNOSIS — I10 Essential (primary) hypertension: Secondary | ICD-10-CM | POA: Insufficient documentation

## 2023-02-16 DIAGNOSIS — S338XXA Sprain of other parts of lumbar spine and pelvis, initial encounter: Secondary | ICD-10-CM | POA: Diagnosis not present

## 2023-02-16 DIAGNOSIS — R413 Other amnesia: Secondary | ICD-10-CM | POA: Diagnosis not present

## 2023-02-16 DIAGNOSIS — I6782 Cerebral ischemia: Secondary | ICD-10-CM | POA: Diagnosis not present

## 2023-02-16 DIAGNOSIS — R0602 Shortness of breath: Secondary | ICD-10-CM | POA: Diagnosis not present

## 2023-02-16 DIAGNOSIS — S134XXA Sprain of ligaments of cervical spine, initial encounter: Secondary | ICD-10-CM | POA: Diagnosis not present

## 2023-02-16 DIAGNOSIS — M9902 Segmental and somatic dysfunction of thoracic region: Secondary | ICD-10-CM | POA: Diagnosis not present

## 2023-02-16 DIAGNOSIS — N3941 Urge incontinence: Secondary | ICD-10-CM

## 2023-02-16 DIAGNOSIS — E1159 Type 2 diabetes mellitus with other circulatory complications: Secondary | ICD-10-CM

## 2023-02-16 DIAGNOSIS — M9901 Segmental and somatic dysfunction of cervical region: Secondary | ICD-10-CM | POA: Diagnosis not present

## 2023-02-16 LAB — CBC WITH DIFFERENTIAL/PLATELET
Abs Immature Granulocytes: 0.04 10*3/uL (ref 0.00–0.07)
Basophils Absolute: 0.1 10*3/uL (ref 0.0–0.1)
Basophils Relative: 1 %
Eosinophils Absolute: 0.1 10*3/uL (ref 0.0–0.5)
Eosinophils Relative: 1 %
HCT: 42.6 % (ref 36.0–46.0)
Hemoglobin: 13.8 g/dL (ref 12.0–15.0)
Immature Granulocytes: 1 %
Lymphocytes Relative: 52 %
Lymphs Abs: 4 10*3/uL (ref 0.7–4.0)
MCH: 31.4 pg (ref 26.0–34.0)
MCHC: 32.4 g/dL (ref 30.0–36.0)
MCV: 97 fL (ref 80.0–100.0)
Monocytes Absolute: 0.6 10*3/uL (ref 0.1–1.0)
Monocytes Relative: 8 %
Neutro Abs: 2.8 10*3/uL (ref 1.7–7.7)
Neutrophils Relative %: 37 %
Platelets: 281 10*3/uL (ref 150–400)
RBC: 4.39 MIL/uL (ref 3.87–5.11)
RDW: 13.2 % (ref 11.5–15.5)
WBC: 7.6 10*3/uL (ref 4.0–10.5)
nRBC: 0 % (ref 0.0–0.2)

## 2023-02-16 LAB — URINALYSIS, ROUTINE W REFLEX MICROSCOPIC
Bilirubin Urine: NEGATIVE
Glucose, UA: NEGATIVE mg/dL
Ketones, ur: NEGATIVE mg/dL
Leukocytes,Ua: NEGATIVE
Nitrite: NEGATIVE
Protein, ur: NEGATIVE mg/dL
Specific Gravity, Urine: 1.003 — ABNORMAL LOW (ref 1.005–1.030)
pH: 7 (ref 5.0–8.0)

## 2023-02-16 LAB — BASIC METABOLIC PANEL
Anion gap: 9 (ref 5–15)
BUN: 16 mg/dL (ref 8–23)
CO2: 27 mmol/L (ref 22–32)
Calcium: 9.7 mg/dL (ref 8.9–10.3)
Chloride: 98 mmol/L (ref 98–111)
Creatinine, Ser: 0.84 mg/dL (ref 0.44–1.00)
GFR, Estimated: >60 mL/min (ref >60)
Glucose, Bld: 237 mg/dL — ABNORMAL HIGH (ref 70–99)
Potassium: 3.8 mmol/L (ref 3.5–5.1)
Sodium: 134 mmol/L — ABNORMAL LOW (ref 135–145)

## 2023-02-16 LAB — HEPATIC FUNCTION PANEL
ALT: 34 U/L (ref 0–44)
AST: 44 U/L — ABNORMAL HIGH (ref 15–41)
Albumin: 3.8 g/dL (ref 3.5–5.0)
Alkaline Phosphatase: 57 U/L (ref 38–126)
Bilirubin, Direct: <0.1 mg/dL (ref 0.0–0.2)
Total Bilirubin: 0.6 mg/dL (ref 0.3–1.2)
Total Protein: 7.4 g/dL (ref 6.5–8.1)

## 2023-02-16 LAB — D-DIMER, QUANTITATIVE: D-Dimer, Quant: <0.27 ug/mL-FEU (ref 0.00–0.50)

## 2023-02-16 MED ORDER — SODIUM CHLORIDE 0.9 % IV BOLUS
1000.0000 mL | Freq: Once | INTRAVENOUS | Status: AC
  Administered 2023-02-16: 1000 mL via INTRAVENOUS

## 2023-02-16 MED ORDER — GEMTESA 75 MG PO TABS
1.0000 | ORAL_TABLET | Freq: Every day | ORAL | 5 refills | Status: DC
Start: 2023-02-16 — End: 2023-04-13

## 2023-02-16 MED ORDER — TRESIBA FLEXTOUCH 200 UNIT/ML ~~LOC~~ SOPN
100.0000 [IU] | PEN_INJECTOR | Freq: Every day | SUBCUTANEOUS | 0 refills | Status: DC
Start: 2023-02-16 — End: 2023-03-04

## 2023-02-16 NOTE — ED Notes (Signed)
Patient transported to CT 

## 2023-02-16 NOTE — ED Provider Notes (Signed)
[place] EMERGENCY DEPARTMENT AT [facility] Provider Note   CSN: [id number] Arrival date & time: 02/16/23  1711     History {Add pertinent medical, surgical, social history, OB history to HPI:1} Chief Complaint  Patient presents with   Shortness of Breath    Rachel Odonnell is a 67 y.o. female.   Shortness of Breath Associated symptoms: headaches   Associated symptoms: no abdominal pain, no chest pain, no fever, no neck pain, no rash and no vomiting        Rachel Odonnell is a 67 y.o. female past medical history of type 2 diabetes, hypertension, migraine headaches, atrial fibrillation, who presents to the Emergency Department from her urologist office for evaluation of low blood pressure and bradycardia.  Patient states that she is having some shortness of breath and feels "lightheaded."  She endorses having these symptoms intermittently for some time and her symptoms usually improve after sleeping.  She denies any facial or extremity weakness.  states her diabetes medications were recently adjusted as her sugars were running in the 3 and 400s.  She currently takes [place] and NovoLog  Home Medications Prior to Admission medications   Medication Sig Start Date End Date Taking? Authorizing Provider  Accu-Chek Softclix Lancets lancets Use as instructed to test blood glucose four times daily 06/09/22   Roma Kayser, MD  busPIRone (BUSPAR) 5 MG tablet Take 5 mg by mouth at bedtime. 01/27/22   [provider]  Cholecalciferol (VITAMIN D3) 125 MCG (5000 UT) CAPS Take 1 capsule (5,000 Units total) by mouth daily. 12/16/21   Roma Kayser, MD  citalopram (CELEXA) 40 MG tablet Take 40 mg by mouth every evening.    [provider]  Continuous Blood Gluc Sensor (FREESTYLE LIBRE 2 SENSOR) MISC Change sensor every 14 days. Use to check glucose continuously 07/23/22   Roma Kayser, MD  Continuous Glucose Receiver (FREESTYLE LIBRE 2 READER) DEVI USE  TO CHECK GLUCOSE AS DIRECTED 12/04/22   Roma Kayser, MD  donepezil (ARICEPT) 5 MG tablet SMARTSIG:1 Tablet(s) By Mouth Every Evening 08/06/22   [provider]  estradiol (ESTRACE) 2 MG tablet Take 2 mg by mouth every evening.    [provider]  fenofibrate (TRICOR) 145 MG tablet Take 145 mg by mouth every evening.    [provider]  gabapentin (NEURONTIN) 100 MG capsule Take 1 capsule (100 mg total) by mouth 3 (three) times daily. 01/14/23   Levert Feinstein, MD  glipiZIDE (GLUCOTROL) 5 MG tablet Take 1 tablet (5 mg total) by mouth daily before breakfast. 12/01/22   Nida, Denman George, MD  glucose blood test strip 1 each by Other route as needed. Use as instructed to test blood glucose four times daily 06/09/22   Roma Kayser, MD  indapamide (LOZOL) 2.5 MG tablet Take 1 tablet (2.5 mg total) by mouth daily. 01/20/23   McKenzie, Mardene Celeste, MD  insulin aspart (NOVOLOG FLEXPEN) 100 UNIT/ML FlexPen Inject 10-16 Units into the skin 3 (three) times daily before meals. 02/08/23   Roma Kayser, MD  insulin degludec (TRESIBA FLEXTOUCH) 200 UNIT/ML FlexTouch Pen Inject 100 Units into the skin at bedtime. 02/16/23   Roma Kayser, MD  Insulin Pen Needle (B-D ULTRAFINE III SHORT PEN) 31G X 8 MM MISC 1 each by Does not apply route as directed. 01/06/22   Roma Kayser, MD  isosorbide mononitrate (IMDUR) 60 MG 24 hr tablet Take 1 tablet (60 mg total) by  mouth daily. 01/14/23   Antoine Poche, MD  levocetirizine (XYZAL) 5 MG tablet Take 5 mg by mouth every evening.    [provider]  levothyroxine (SYNTHROID) 50 MCG tablet Take 1 tablet (50 mcg total) by mouth daily before breakfast. 01/14/23   Nida, Denman George, MD  metFORMIN (GLUCOPHAGE) 500 MG tablet Take 1 tablet (500 mg total) by mouth 2 (two) times daily with a meal. 12/01/22   Nida, Denman George, MD  modafinil (PROVIGIL) 100 MG tablet Take 1 tablet by mouth once daily 10/30/22    Oretha Milch, MD  montelukast (SINGULAIR) 10 MG tablet Take 10 mg by mouth at bedtime.    [provider]  Multiple Vitamin (MULTIVITAMIN ADULT PO) Take 1 tablet by mouth daily.    [provider]  Omega-3 Fatty Acids (FISH OIL) 1000 MG CAPS Take 1,000 mg by mouth 2 (two) times daily. Morning and evening    [provider]  omeprazole (PRILOSEC) 40 MG capsule Take 40 mg by mouth every evening.    [provider]  rosuvastatin (CRESTOR) 20 MG tablet Take 1 tablet (20 mg total) by mouth daily. Patient taking differently: Take 40 mg by mouth daily. 01/14/23   Antoine Poche, MD  Semaglutide,0.25 or 0.5MG /DOS, (OZEMPIC, 0.25 OR 0.5 MG/DOSE,) 2 MG/1.5ML SOPN Inject 0.25 mg into the skin once a week. 02/03/23   Roma Kayser, MD  Vibegron (GEMTESA) 75 MG TABS Take 1 tablet (75 mg total) by mouth daily. 02/16/23   Donnita Falls, FNP  warfarin (COUMADIN) 5 MG tablet Take 1/2 a tablet to 1 tablet by mouth daily as directed by the coumadin clinic. 01/14/23   Antoine Poche, MD      Allergies    Azithromycin, Codeine, Atomoxetine, Atorvastatin, and Lisinopril    Review of Systems   Review of Systems  Constitutional:  Negative for appetite change, chills, fatigue and fever.  Eyes:  Negative for visual disturbance.  Respiratory:  Positive for shortness of breath.   Cardiovascular:  Negative for chest pain and leg swelling.  Gastrointestinal:  Negative for abdominal pain, diarrhea, nausea and vomiting.  Genitourinary:  Negative for dysuria.  Musculoskeletal:  Negative for back pain, neck pain and neck stiffness.  Skin:  Negative for rash and wound.  Neurological:  Positive for weakness, light-headedness and headaches. Negative for dizziness, seizures, syncope, facial asymmetry and numbness.  Psychiatric/Behavioral:  Negative for confusion.     Physical Exam Updated Vital Signs BP 132/67 (BP Location: Right Arm)   Pulse (!) 48   Temp 97.8 F (36.6  C) (Oral)   Resp 18   Ht 5\' 4"  (1.626 m)   Wt 102.1 kg   SpO2 100%   BMI 38.62 kg/m  Physical Exam Vitals and nursing note reviewed.  Constitutional:      General: She is not in acute distress.    Appearance: Normal appearance. She is well-developed. She is not toxic-appearing.  HENT:     Head: Atraumatic.     Mouth/Throat:     Mouth: Mucous membranes are moist.  Eyes:     Extraocular Movements: Extraocular movements intact.     Conjunctiva/sclera: Conjunctivae normal.     Pupils: Pupils are equal, round, and reactive to light.  Cardiovascular:     Rate and Rhythm: Normal rate and regular rhythm.  Pulmonary:     Effort: Pulmonary effort is normal.  Chest:     Chest wall: No tenderness.  Abdominal:  General: There is no distension.     Palpations: Abdomen is soft.     Tenderness: There is no abdominal tenderness. There is no guarding.  Musculoskeletal:        General: Normal range of motion.     Right lower leg: No edema.     Left lower leg: No edema.  Skin:    General: Skin is warm.  Neurological:     Mental Status: She is alert.     Sensory: No sensory deficit.     Motor: No weakness.     ED Results / Procedures / Treatments   Labs (all labs ordered are listed, but only abnormal results are displayed) Labs Reviewed  BASIC METABOLIC PANEL - Abnormal; Notable for the following components:      Result Value   Sodium 134 (*)    Glucose, Bld 237 (*)    All other components within normal limits  CBC WITH DIFFERENTIAL/PLATELET    EKG None  Radiology DG Chest Portable 1 View  Result Date: 02/16/2023 CLINICAL DATA:  Shortness of breath EXAM: PORTABLE CHEST 1 VIEW COMPARISON:  None Available. FINDINGS: No consolidation, pneumothorax or effusion. Normal cardiopericardial silhouette without edema. IMPRESSION: No acute cardiopulmonary disease. Electronically Signed   By: Karen Kays M.D.   On: 02/16/2023 17:46    Procedures Procedures  {Document cardiac  monitor, telemetry assessment procedure when appropriate:1}  Medications Ordered in ED Medications - No data to display  ED Course/ Medical Decision Making/ A&P   {   Click here for ABCD2, HEART and other calculatorsREFRESH Note before signing :1}                          Medical Decision Making Patient here for shortness of breath, frontal headache fatigue.  Sent from urologist office for bradycardia and hypotension.  Blood pressure here 132 systolic heart rate in the 40s and 50s no focal neurodeficits on my exam.  Patient denies chest pain.  States she has had the symptoms intermittently for some time.  Been hyperglycemic currently on NovoLog and Tresiba blood sugars have been elevated into the 400s per patient  Differential would include but not limited to hyperglycemic state, infectious process, neurologic process  Amount and/or Complexity of Data Reviewed Labs: ordered.    Details: Labs interpreted by me, no evidence of leukocytosis, hemoglobin unremarkable, chemistries show blood sugar of 237, reassuring anion gap and bicarb. Radiology: ordered.    Details: Chest x-ray without acute cardiopulmonary process ECG/medicine tests: ordered.    Details: EKG shows sinus bradycardia septal infarct, when compared to EKG of 07/06/2012 no significant change was found     {Document critical care time when appropriate:1} {Document review of labs and clinical decision tools ie heart score, Chads2Vasc2 etc:1}  {Document your independent review of radiology images, and any outside records:1} {Document your discussion with family members, caretakers, and with consultants:1} {Document social determinants of health affecting pt's care:1} {Document your decision making why or why not admission, treatments were needed:1} Final Clinical Impression(s) / ED Diagnoses Final diagnoses:  None    Rx / DC Orders ED Discharge Orders     None

## 2023-02-16 NOTE — ED Notes (Signed)
Pt appears to be comfortable and resting, laying on her side with her husband at bedside in hall bed at nurses station, can observe even RR that are unlabored, skin warm and dry to touch, pt awakes upon touch to shoulder, side rails up x2 for safety, NAD noted, plan of care ongoing, no further concerns as of present.  Husband voiced no needs at this time.

## 2023-02-16 NOTE — ED Triage Notes (Signed)
Pt with c/o SOB and lightheadedness.  Low HR, pt was at her urologist.  Denies any pain.

## 2023-02-17 ENCOUNTER — Emergency Department (HOSPITAL_COMMUNITY): Payer: Medicare HMO

## 2023-02-17 ENCOUNTER — Telehealth: Payer: Self-pay | Admitting: *Deleted

## 2023-02-17 DIAGNOSIS — G319 Degenerative disease of nervous system, unspecified: Secondary | ICD-10-CM | POA: Diagnosis not present

## 2023-02-17 DIAGNOSIS — M545 Low back pain, unspecified: Secondary | ICD-10-CM | POA: Diagnosis not present

## 2023-02-17 DIAGNOSIS — I6782 Cerebral ischemia: Secondary | ICD-10-CM | POA: Diagnosis not present

## 2023-02-17 DIAGNOSIS — R42 Dizziness and giddiness: Secondary | ICD-10-CM | POA: Diagnosis not present

## 2023-02-17 LAB — URINALYSIS, ROUTINE W REFLEX MICROSCOPIC
Bilirubin, UA: NEGATIVE
Ketones, UA: NEGATIVE
Leukocytes,UA: NEGATIVE
Nitrite, UA: NEGATIVE
Protein,UA: NEGATIVE
RBC, UA: NEGATIVE
Specific Gravity, UA: 1.025 (ref 1.005–1.030)
Urobilinogen, Ur: 4 mg/dL — ABNORMAL HIGH (ref 0.2–1.0)
pH, UA: 7 (ref 5.0–7.5)

## 2023-02-17 MED ORDER — SODIUM CHLORIDE 0.9 % IV BOLUS
500.0000 mL | Freq: Once | INTRAVENOUS | Status: AC
  Administered 2023-02-17: 500 mL via INTRAVENOUS

## 2023-02-17 NOTE — ED Notes (Signed)
Patient transported to MRI 

## 2023-02-17 NOTE — ED Provider Notes (Signed)
  Provider Note MRN:  [id number]  Arrival date & time: 02/17/23    ED Course and Medical Decision Making  Assumed care from PA Triplett at shift change.  Found to be hypotensive and bradycardic and lightheaded at PCP office today, sent here for evaluation.  Hyperglycemic as well.  Blood pressure is normal here in the emergency department.  Heart rate seems to be at or near her baseline heart rate in the 50s based on our records.  Still feeling generally unwell and having difficulty sitting up and ambulating due to lightheadedness.  Workup reassuring, will reassess after fluids.  1:40 AM update: Patient wakes easily on my assessment, explains that she has had balance issues all day today, lightheadedness, dizziness.  Will obtain MRI in the morning to exclude acute ischemic stroke.  Otherwise she does ambulate without assistance and possibly this is related to dehydration in the setting of hyperglycemia.  If MRI is normal I suspect patient can safely be discharged with PCP follow-up.  Signed out to oncoming provider at shift change.  Procedures  Final Clinical Impressions(s) / ED Diagnoses     ICD-10-CM   1. Dizzy  R42       ED Discharge Orders     None       Discharge Instructions   None     Elmer Sow. Pilar Plate, MD [facility] [email or web address]    Sabas Sous, MD 02/17/23 [phone]

## 2023-02-17 NOTE — ED Provider Notes (Signed)
Signout from Dr. Pilar Plate.  67 year old female sent here from PCPs office for bradycardic low blood pressure dizziness lightheadedness.  Patient's heart rate and blood pressure been stable here and she has received some IV fluids.  Due to dizziness she is pending MRI brain.  If negative likely can be discharged to follow-up outpatient with her treatment team. Physical Exam  BP 112/64   Pulse (!) 51   Temp 97.8 F (36.6 C) (Oral)   Resp 15   Ht 5\' 4"  (1.626 m)   Wt 102.1 kg   SpO2 96%   BMI 38.62 kg/m   Physical Exam  Procedures  Procedures  ED Course / MDM    Medical Decision Making Amount and/or Complexity of Data Reviewed Labs: ordered. Radiology: ordered.   Patient's MRI does not show any acute findings.  She said she ambulated to the bathroom felt a little unsteady but baseline for her.  She said she has seen Dr. Dierdre Searles in the past.  She is comfortable plan for discharge.  Will put a referral back in for her to see neurology.  Recommend close follow-up with her PCP.  Return instructions discussed.       Terrilee Files, MD 02/17/23 [phone]

## 2023-02-17 NOTE — Progress Notes (Signed)
   02/17/23 0141  BiPAP/CPAP/SIPAP  $ Face Mask Medium Yes  BiPAP/CPAP/SIPAP Pt Type Adult  BiPAP/CPAP/SIPAP DREAMSTATIOND  Mask Type Full face mask  Mask Size Medium  FiO2 (%) 21 %  Patient Home Equipment No  Auto Titrate Yes (5-20)

## 2023-02-17 NOTE — ED Notes (Signed)
Dr. Pilar Plate at bedside to consult pt regarding POC

## 2023-02-17 NOTE — Telephone Encounter (Signed)
Patient states she recently switched pharmacies and would like a prescription sent over for Provigil sent over to [facility].

## 2023-02-17 NOTE — Discharge Instructions (Addendum)
You are seen in the emergency department for feeling dizzy lightheaded.  You had blood work EKG and an MRI of your brain that did not show an obvious explanation for your symptoms.  Please monitor your heart rate and blood pressure at home, keep well-hydrated.  Follow-up with your primary care doctor and your cardiology team.  We have also put a referral in for you to follow-up with neurology.  Return to the emergency department if any worsening or concerning symptoms.

## 2023-02-18 NOTE — Progress Notes (Signed)
Cardiology Office Note:  .   Date:  02/19/2023  ID:  Rachel Odonnell, DOB 09-Mar-1956, MRN [id number] PCP: Rachel Odonnell  [facility] Providers Cardiologist:  Nona Dell, MD Cardiology APP:  Netta Neat., NP (Inactive) {  History of Present Illness: Rachel Odonnell is a 67 y.o. female with a past medical history of ADD, diabetes mellitus, hypertension, hyperlipidemia, OSA, atrial fibrillation, CAD and chest pain who presents for follow-up.  Patient seen by Dr. Wyline Mood 12/23/2022.  Nuclear stress test 02/2018 was normal.  Echocardiogram at [facility] 7/22 showed LVEF 66 5%, normal RV, mild AI, no significant valve disease.  05/2021 LHC/RHC showed mild to moderate nonobstructive CAD.  LVEDP 11, mean PA 22, PCWP 2, CI 2.3.  PFTs done December 2022 with some restriction likely secondary to body habitus.  Patient's had a history of chronic SOB.  Seen in the ED 7/2 for bradycardia.  Patient presented to PCP office and had low blood pressure, dizziness, lightheadedness, and bradycardia.  IV fluids administered.  MRI of the brain was also ordered.  MRI did not show any acute findings.  Patient was referred to neurology and stable for discharge.  Today, she tells me that she was at the urologist office and they did not talk much about her kidney stones.  She felt out of it at the time.  Recently on vacation and forgot her diabetic medications.  Was prescribed Novolin instead of NovoLog.  Kept her sugars down.  Sugar was elevated in the 200s at time of ED visit.  Not likely due to hypoglycemia.  Did have some shortness of breath as well but no chest pain.  Sleepy all the time.  Was given Provigil by her pulmonary/sleep medicine doctor since she was sleeping 14 to 16 hours a day.  She does have sleep apnea but is compliant with her CPAP.  Has a history of atrial fibrillation but is unaware when she is in and out of it.  Heart rate is 54 today, blood pressure 116/50.  She did feel slightly  better after IV fluids but still felt out of it.  Has been drinking plenty of water since then.  Reports no chest pain, pressure, or tightness. No edema, orthopnea, PND. Reports no palpitations.     ROS: Pertinent ROS in HPI  Studies Reviewed: .       Cardiac catheterization 06/13/2021 Left Main  Vessel was injected. Vessel is normal in caliber. Vessel is angiographically normal.    Left Anterior Descending  Vessel was injected. Vessel is normal in caliber.  Prox LAD to Mid LAD lesion is 45% stenosed.    Left Circumflex    First Obtuse Marginal Branch  1st Mrg lesion is 60% stenosed.    Right Coronary Artery  Prox RCA to Mid RCA lesion is 25% stenosed.    Intervention   No interventions have been documented.   Right Heart  Right Heart Pressures Hemodynamic findings consistent with pulmonary hypertension.   Left Heart  Left Ventricle The left ventricular size is normal. The left ventricular systolic function is normal. LV end diastolic pressure is normal. The left ventricular ejection fraction is 55-65% by visual estimate. No regional wall motion abnormalities.   Coronary Diagrams  Diagnostic Dominance: Right  Intervention  Risk Assessment/Calculations:    CHA2DS2-VASc Score = 4   This indicates a 4.8% annual risk of stroke. The patient's score is based upon: CHF History: 0 HTN History: 1 Diabetes History:  0 Stroke History: 0 Vascular Disease History: 1 Age Score: 1 Gender Score: 1            Physical Exam:   VS:  BP (!) 116/50   Pulse (!) 54   Ht 5\' 4"  (1.626 m)   Wt 228 lb 9.6 oz (103.7 kg)   SpO2 98%   BMI 39.24 kg/m    Wt Readings from Last 3 Encounters:  02/19/23 228 lb 9.6 oz (103.7 kg)  02/16/23 225 lb (102.1 kg)  01/28/23 226 lb 12.8 oz (102.9 kg)    GEN: Well nourished, well developed in no acute distress NECK: No JVD; No carotid bruits CARDIAC: RRR, + systolic murmurs, rubs, gallops RESPIRATORY:  Clear to auscultation without  rales, wheezing or rhonchi  ABDOMEN: Soft, non-tender, non-distended EXTREMITIES:  No edema; No deformity   ASSESSMENT AND PLAN: .   1.  Confusion/SOB/Dizziness -Ruled out stroke -Remains on Coumadin for atrial fibrillation -Reviewed her most recent cardiac catheterization which was in 2022 and she had some coronary disease at that time -Also, has not had an updated echocardiogram which we have ordered today -Will go ahead and order a coronary CTA to further evaluate her coronary disease  2.  A-fib, PAF -in and out -on coumadin -CHA2DS2-VASc score of 4, 4.8% annual risk of stroke -Normal sinus rhythm today  3.  OSA -on CPAP -Dr. Vassie Loll  4.  Hyperlipidemia -fasted today, lipid panel and LFTs ordered -Continue current medications, Crestor 20 mg daily. -Last LDL was 120, likely will need a medication adjustment  5.  Hypertension -doing better and today was 116/50 -We have asked her to keep track of her blood pressure at home an hour after morning medications and write it down -If blood pressure is getting too low could consider decreasing Imdur      Dispo: She can follow-up in 2 months with me or APP  Signed, Sharlene Dory, PA-C

## 2023-02-19 ENCOUNTER — Encounter: Payer: Self-pay | Admitting: Physician Assistant

## 2023-02-19 ENCOUNTER — Ambulatory Visit: Payer: Medicare HMO | Attending: Physician Assistant | Admitting: Physician Assistant

## 2023-02-19 ENCOUNTER — Other Ambulatory Visit: Payer: Self-pay | Admitting: Pulmonary Disease

## 2023-02-19 VITALS — BP 116/50 | HR 54 | Ht 64.0 in | Wt 228.6 lb

## 2023-02-19 DIAGNOSIS — I4891 Unspecified atrial fibrillation: Secondary | ICD-10-CM

## 2023-02-19 DIAGNOSIS — I10 Essential (primary) hypertension: Secondary | ICD-10-CM | POA: Diagnosis not present

## 2023-02-19 DIAGNOSIS — E785 Hyperlipidemia, unspecified: Secondary | ICD-10-CM

## 2023-02-19 DIAGNOSIS — R030 Elevated blood-pressure reading, without diagnosis of hypertension: Secondary | ICD-10-CM | POA: Diagnosis not present

## 2023-02-19 DIAGNOSIS — I480 Paroxysmal atrial fibrillation: Secondary | ICD-10-CM | POA: Diagnosis not present

## 2023-02-19 DIAGNOSIS — E119 Type 2 diabetes mellitus without complications: Secondary | ICD-10-CM

## 2023-02-19 DIAGNOSIS — R079 Chest pain, unspecified: Secondary | ICD-10-CM

## 2023-02-19 DIAGNOSIS — E1165 Type 2 diabetes mellitus with hyperglycemia: Secondary | ICD-10-CM | POA: Diagnosis not present

## 2023-02-19 DIAGNOSIS — R0602 Shortness of breath: Secondary | ICD-10-CM

## 2023-02-19 DIAGNOSIS — I2510 Atherosclerotic heart disease of native coronary artery without angina pectoris: Secondary | ICD-10-CM | POA: Diagnosis not present

## 2023-02-19 DIAGNOSIS — R0609 Other forms of dyspnea: Secondary | ICD-10-CM | POA: Diagnosis not present

## 2023-02-19 DIAGNOSIS — G4733 Obstructive sleep apnea (adult) (pediatric): Secondary | ICD-10-CM | POA: Diagnosis not present

## 2023-02-19 DIAGNOSIS — R55 Syncope and collapse: Secondary | ICD-10-CM

## 2023-02-19 DIAGNOSIS — E039 Hypothyroidism, unspecified: Secondary | ICD-10-CM | POA: Diagnosis not present

## 2023-02-19 DIAGNOSIS — J449 Chronic obstructive pulmonary disease, unspecified: Secondary | ICD-10-CM | POA: Diagnosis not present

## 2023-02-19 DIAGNOSIS — Z794 Long term (current) use of insulin: Secondary | ICD-10-CM

## 2023-02-19 DIAGNOSIS — Z0001 Encounter for general adult medical examination with abnormal findings: Secondary | ICD-10-CM | POA: Diagnosis not present

## 2023-02-19 MED ORDER — MODAFINIL 100 MG PO TABS: 100.0000 mg | ORAL_TABLET | Freq: Every day | ORAL | 0 refills | Status: DC

## 2023-02-19 MED ORDER — METOPROLOL TARTRATE 25 MG PO TABS: 25.0000 mg | ORAL_TABLET | Freq: Once | ORAL | 0 refills | Status: DC

## 2023-02-19 NOTE — Patient Instructions (Signed)
Medication Instructions:  Your physician recommends that you continue on your current medications as directed. Please refer to the Current Medication list given to you today.  *If you need a refill on your cardiac medications before your next appointment, please call your pharmacy*   Lab Work: BMET, LIPIDS, LFT'S-TODAY If you have labs (blood work) drawn today and your tests are completely normal, you will receive your results only by: MyChart Message (if you have MyChart) OR A paper copy in the mail If you have any lab test that is abnormal or we need to change your treatment, we will call you to review the results.   Testing/Procedures: Your physician has requested that you have an echocardiogram. Echocardiography is a painless test that uses sound waves to create images of your heart. It provides your doctor with information about the size and shape of your heart and how well your heart's chambers and valves are working. This procedure takes approximately one hour. There are no restrictions for this procedure. Please do NOT wear cologne, perfume, aftershave, or lotions (deodorant is allowed). Please arrive 15 minutes prior to your appointment time.     Your cardiac CT will be scheduled at one of the below locations:   [facility] [address] [place], [place] [address] [phone]  OR  [facility] [address] [place], [place] [address] [phone]  OR   [facility] [address] [place], [place] [address] [phone]  If scheduled at [facility], please arrive at the [facility] and Children's Entrance (Entrance C2) of [facility] 30 minutes prior to test start time. You can use the FREE valet parking offered at entrance C (encouraged to control the heart rate for the test)  Proceed to the [facility] Radiology Department (first floor) to check-in and test prep.  All  radiology patients and guests should use entrance C2 at [facility], accessed from [facility], even though the hospital's physical address listed is [address].    If scheduled at [facility] or [facility], please arrive 15 mins early for check-in and test prep.   Please follow these instructions carefully (unless otherwise directed):  An IV will be required for this test and Nitroglycerin will be given.  Hold all erectile dysfunction medications at least 3 days (72 hrs) prior to test. (Ie viagra, cialis, sildenafil, tadalafil, etc)   On the Night Before the Test: Be sure to Drink plenty of water. Do not consume any caffeinated/decaffeinated beverages or chocolate 12 hours prior to your test. Do not take any antihistamines 12 hours prior to your test. If the patient has contrast allergy: Patient will need a prescription for Prednisone and very clear instructions (as follows): Prednisone 50 mg - take 13 hours prior to test Take another Prednisone 50 mg 7 hours prior to test Take another Prednisone 50 mg 1 hour prior to test Take Benadryl 50 mg 1 hour prior to test Patient must complete all four doses of above prophylactic medications. Patient will need a ride after test due to Benadryl.  On the Day of the Test: Drink plenty of water until 1 hour prior to the test. Do not eat any food 1 hour prior to test. You may take your regular medications prior to the test.  Take metoprolol tartrate (Lopressor) 25 mg two hours prior to test. If you take Furosemide/Hydrochlorothiazide/Spironolactone, please HOLD on the  morning of the test. FEMALES- please wear underwire-free bra if available, avoid dresses & tight clothing    After the Test: Drink plenty of water. After receiving IV contrast, you may experience a mild flushed feeling. This is normal. On occasion, you may experience a mild rash up to 24 hours after  the test. This is not dangerous. If this occurs, you can take Benadryl 25 mg and increase your fluid intake. If you experience trouble breathing, this can be serious. If it is severe call 911 IMMEDIATELY. If it is mild, please call our office. If you take any of these medications: Glipizide/Metformin, Avandament, Glucavance, please do not take 48 hours after completing test unless otherwise instructed.  We will call to schedule your test 2-4 weeks out understanding that some insurance companies will need an authorization prior to the service being performed.   For more information and frequently asked questions, please visit our website : [email or web address]  For non-scheduling related questions, please contact the cardiac imaging nurse navigator should you have any questions/concerns: Rockwell [place], Cardiac Imaging Nurse Navigator Larey Brick, Cardiac Imaging Nurse Navigator [facility] Direct Office Dial: [phone]   For scheduling needs, including cancellations and rescheduling, please call [place], [phone].   Follow-Up: At [facility], you and your health needs are our priority.  As part of our continuing mission to provide you with exceptional heart care, we have created designated Provider Care Teams.  These Care Teams include your primary Cardiologist (physician) and Advanced Practice Providers (APPs -  Physician Assistants and Nurse Practitioners) who all work together to provide you with the care you need, when you need it.  Your next appointment:   6-8 week(s)  Provider:   [name], PA-C       Other Instructions Increase water intake to 64 oz daily  Low-Sodium Eating Plan Salt (sodium) helps you keep a healthy balance of fluids in your body. Too much sodium can raise your blood pressure. It can also cause fluid and waste to be held in your body. Your health care provider or dietitian may recommend a low-sodium  eating plan if you have high blood pressure (hypertension), kidney disease, liver disease, or heart failure. Eating less sodium can help lower your blood pressure and reduce swelling. It can also protect your heart, liver, and kidneys. What are tips for following this plan? Reading food labels  Check food labels for the amount of sodium per serving. If you eat more than one serving, you must multiply the listed amount by the number of servings. Choose foods with less than 140 milligrams (mg) of sodium per serving. Avoid foods with 300 mg of sodium or more per serving. Always check how much sodium is in a product, even if the label says "unsalted" or "no salt added." Shopping  Buy products labeled as "low-sodium" or "no salt added." Buy fresh foods. Avoid canned foods and pre-made or frozen meals. Avoid canned, cured, or processed meats. Buy breads that have less than 80 mg of sodium per slice. Cooking  Eat more home-cooked food. Try to eat less restaurant, buffet, and fast food. Try not to add salt when you cook. Use salt-free seasonings or herbs instead of table salt or sea salt. Check with your provider or pharmacist before using salt substitutes. Cook with plant-based oils, such as canola, sunflower, or olive oil. Meal planning When eating at a restaurant, ask if your food can be made with less salt or no salt. Avoid  dishes labeled as brined, pickled, cured, or smoked. Avoid dishes made with soy sauce, miso, or teriyaki sauce. Avoid foods that have monosodium glutamate (MSG) in them. MSG may be added to some restaurant food, sauces, soups, bouillon, and canned foods. Make meals that can be grilled, baked, poached, roasted, or steamed. These are often made with less sodium. General information Try to limit your sodium intake to 1,500-2,300 mg each day, or the amount told by your provider. What foods should I eat? Fruits Fresh, frozen, or canned fruit. Fruit juice. Vegetables Fresh or  frozen vegetables. "No salt added" canned vegetables. "No salt added" tomato sauce and paste. Low-sodium or reduced-sodium tomato and vegetable juice. Grains Low-sodium cereals, such as oats, puffed wheat and rice, and shredded wheat. Low-sodium crackers. Unsalted rice. Unsalted pasta. Low-sodium bread. Whole grain breads and whole grain pasta. Meats and other proteins Fresh or frozen meat, poultry, seafood, and fish. These should have no added salt. Low-sodium canned tuna and salmon. Unsalted nuts. Dried peas, beans, and lentils without added salt. Unsalted canned beans. Eggs. Unsalted nut butters. Dairy Milk. Soy milk. Cheese that is naturally low in sodium, such as ricotta cheese, fresh mozzarella, or Swiss cheese. Low-sodium or reduced-sodium cheese. Cream cheese. Yogurt. Seasonings and condiments Fresh and dried herbs and spices. Salt-free seasonings. Low-sodium mustard and ketchup. Sodium-free salad dressing. Sodium-free light mayonnaise. Fresh or refrigerated horseradish. Lemon juice. Vinegar. Other foods Homemade, reduced-sodium, or low-sodium soups. Unsalted popcorn and pretzels. Low-salt or salt-free chips. The items listed above may not be all the foods and drinks you can have. Talk to a dietitian to learn more. What foods should I avoid? Vegetables Sauerkraut, pickled vegetables, and relishes. Olives. [place] fries. Onion rings. Regular canned vegetables, except low-sodium or reduced-sodium items. Regular canned tomato sauce and paste. Regular tomato and vegetable juice. Frozen vegetables in sauces. Grains Instant hot cereals. Bread stuffing, pancake, and biscuit mixes. Croutons. Seasoned rice or pasta mixes. Noodle soup cups. Boxed or frozen macaroni and cheese. Regular salted crackers. Self-rising flour. Meats and other proteins Meat or fish that is salted, canned, smoked, spiced, or pickled. Precooked or cured meat, such as sausages or meat loaves. Tomasa Blase. Ham. Pepperoni. Hot dogs.  Corned beef. Chipped beef. Salt pork. Jerky. Pickled herring, anchovies, and sardines. Regular canned tuna. Salted nuts. Dairy Processed cheese and cheese spreads. Hard cheeses. Cheese curds. Blue cheese. Feta cheese. String cheese. Regular cottage cheese. Buttermilk. Canned milk. Fats and oils Salted butter. Regular margarine. Ghee. Bacon fat. Seasonings and condiments Onion salt, garlic salt, seasoned salt, table salt, and sea salt. Canned and packaged gravies. Worcestershire sauce. Tartar sauce. Barbecue sauce. Teriyaki sauce. Soy sauce, including reduced-sodium soy sauce. Steak sauce. Fish sauce. Oyster sauce. Cocktail sauce. Horseradish that you find on the shelf. Regular ketchup and mustard. Meat flavorings and tenderizers. Bouillon cubes. Hot sauce. Pre-made or packaged marinades. Pre-made or packaged taco seasonings. Relishes. Regular salad dressings. Salsa. Other foods Salted popcorn and pretzels. Corn chips and puffs. Potato and tortilla chips. Canned or dried soups. Pizza. Frozen entrees and pot pies. The items listed above may not be all the foods and drinks you should avoid. Talk to a dietitian to learn more. This information is not intended to replace advice given to you by your health care provider. Make sure you discuss any questions you have with your health care provider. Document Revised: 08/20/2022 Document Reviewed: 08/20/2022 Elsevier Patient Education  2024 Elsevier Inc.  Heart-Healthy Eating Plan Many factors influence your heart health, including eating and  exercise habits. Heart health is also called coronary health. Coronary risk increases with abnormal blood fat (lipid) levels. A heart-healthy eating plan includes limiting unhealthy fats, increasing healthy fats, limiting salt (sodium) intake, and making other diet and lifestyle changes. What is my plan? Your health care provider may recommend that: You limit your fat intake to _________% or less of your total calories  each day. You limit your saturated fat intake to _________% or less of your total calories each day. You limit the amount of cholesterol in your diet to less than _________ mg per day. You limit the amount of sodium in your diet to less than _________ mg per day. What are tips for following this plan? Cooking Cook foods using methods other than frying. Baking, boiling, grilling, and broiling are all good options. Other ways to reduce fat include: Removing the skin from poultry. Removing all visible fats from meats. Steaming vegetables in water or broth. Meal planning  At meals, imagine dividing your plate into fourths: Fill one-half of your plate with vegetables and green salads. Fill one-fourth of your plate with whole grains. Fill one-fourth of your plate with lean protein foods. Eat 2-4 cups of vegetables per day. One cup of vegetables equals 1 cup (91 g) broccoli or cauliflower florets, 2 medium carrots, 1 large bell pepper, 1 large sweet potato, 1 large tomato, 1 medium [place] potato, 2 cups (150 g) raw leafy greens. Eat 1-2 cups of fruit per day. One cup of fruit equals 1 small apple, 1 large banana, 1 cup (237 g) mixed fruit, 1 large orange,  cup (82 g) dried fruit, 1 cup (240 mL) 100% fruit juice. Eat more foods that contain soluble fiber. Examples include apples, broccoli, carrots, beans, peas, and barley. Aim to get 25-30 g of fiber per day. Increase your consumption of legumes, nuts, and seeds to 4-5 servings per week. One serving of dried beans or legumes equals  cup (90 g) cooked, 1 serving of nuts is  oz (12 almonds, 24 pistachios, or 7 walnut halves), and 1 serving of seeds equals  oz (8 g). Fats Choose healthy fats more often. Choose monounsaturated and polyunsaturated fats, such as olive and canola oils, avocado oil, flaxseeds, walnuts, almonds, and seeds. Eat more omega-3 fats. Choose salmon, mackerel, sardines, tuna, flaxseed oil, and ground flaxseeds. Aim to eat fish  at least 2 times each week. Check food labels carefully to identify foods with trans fats or high amounts of saturated fat. Limit saturated fats. These are found in animal products, such as meats, butter, and cream. Plant sources of saturated fats include palm oil, palm kernel oil, and coconut oil. Avoid foods with partially hydrogenated oils in them. These contain trans fats. Examples are stick margarine, some tub margarines, cookies, crackers, and other baked goods. Avoid fried foods. General information Eat more home-cooked food and less restaurant, buffet, and fast food. Limit or avoid alcohol. Limit foods that are high in added sugar and simple starches such as foods made using [place] refined flour (Tibbs breads, pastries, sweets). Lose weight if you are overweight. Losing just 5-10% of your body weight can help your overall health and prevent diseases such as diabetes and heart disease. Monitor your sodium intake, especially if you have high blood pressure. Talk with your health care provider about your sodium intake. Try to incorporate more vegetarian meals weekly. What foods should I eat? Fruits All fresh, canned (in natural juice), or frozen fruits. Vegetables Fresh or frozen vegetables (raw, steamed,  roasted, or grilled). Green salads. Grains Most grains. Choose whole wheat and whole grains most of the time. Rice and pasta, including brown rice and pastas made with whole wheat. Meats and other proteins Lean, well-trimmed beef, veal, pork, and lamb. Chicken and [place] without skin. All fish and shellfish. Wild duck, rabbit, pheasant, and venison. Egg whites or low-cholesterol egg substitutes. Dried beans, peas, lentils, and tofu. Seeds and most nuts. Dairy Low-fat or nonfat cheeses, including ricotta and mozzarella. Skim or 1% milk (liquid, powdered, or evaporated). Buttermilk made with low-fat milk. Nonfat or low-fat yogurt. Fats and oils Non-hydrogenated (trans-free) margarines.  Vegetable oils, including soybean, sesame, sunflower, olive, avocado, peanut, safflower, corn, canola, and cottonseed. Salad dressings or mayonnaise made with a vegetable oil. Beverages Water (mineral or sparkling). Coffee and tea. Unsweetened ice tea. Diet beverages. Sweets and desserts Sherbet, gelatin, and fruit ice. Small amounts of dark chocolate. Limit all sweets and desserts. Seasonings and condiments All seasonings and condiments. The items listed above may not be a complete list of foods and beverages you can eat. Contact a dietitian for more options. What foods should I avoid? Fruits Canned fruit in heavy syrup. Fruit in cream or butter sauce. Fried fruit. Limit coconut. Vegetables Vegetables cooked in cheese, cream, or butter sauce. Fried vegetables. Grains Breads made with saturated or trans fats, oils, or whole milk. Croissants. Sweet rolls. Donuts. High-fat crackers, such as cheese crackers and chips. Meats and other proteins Fatty meats, such as hot dogs, ribs, sausage, bacon, rib-eye roast or steak. High-fat deli meats, such as salami and bologna. Caviar. Domestic duck and goose. Organ meats, such as liver. Dairy Cream, sour cream, cream cheese, and creamed cottage cheese. Whole-milk cheeses. Whole or 2% milk (liquid, evaporated, or condensed). Whole buttermilk. Cream sauce or high-fat cheese sauce. Whole-milk yogurt. Fats and oils Meat fat, or shortening. Cocoa butter, hydrogenated oils, palm oil, coconut oil, palm kernel oil. Solid fats and shortenings, including bacon fat, salt pork, lard, and butter. Nondairy cream substitutes. Salad dressings with cheese or sour cream. Beverages Regular sodas and any drinks with added sugar. Sweets and desserts Frosting. Pudding. Cookies. Cakes. Pies. Milk chocolate or [place] chocolate. Buttered syrups. Full-fat ice cream or ice cream drinks. The items listed above may not be a complete list of foods and beverages to avoid. Contact a  dietitian for more information. Summary Heart-healthy meal planning includes limiting unhealthy fats, increasing healthy fats, limiting salt (sodium) intake and making other diet and lifestyle changes. Lose weight if you are overweight. Losing just 5-10% of your body weight can help your overall health and prevent diseases such as diabetes and heart disease. Focus on eating a balance of foods, including fruits and vegetables, low-fat or nonfat dairy, lean protein, nuts and legumes, whole grains, and heart-healthy oils and fats. This information is not intended to replace advice given to you by your health care provider. Make sure you discuss any questions you have with your health care provider. Document Revised: 09/08/2021 Document Reviewed: 09/08/2021 Elsevier Patient Education  2024 [employer].

## 2023-02-19 NOTE — Telephone Encounter (Signed)
Spoke with patient. Advised Provigil has been sent to pharmacy. Closing encounter NFN

## 2023-02-19 NOTE — Telephone Encounter (Signed)
Dr.O, pt is requesting a refill of Provigil to be sent to [facility]. Routing to you since Dr.Alva is out of town.

## 2023-02-19 NOTE — Telephone Encounter (Signed)
Prescription was sent in for Provigil [facility]

## 2023-02-20 LAB — BASIC METABOLIC PANEL
BUN/Creatinine Ratio: 13 (ref 12–28)
BUN: 11 mg/dL (ref 8–27)
CO2: 26 mmol/L (ref 20–29)
Calcium: 10.9 mg/dL — ABNORMAL HIGH (ref 8.7–10.3)
Chloride: 99 mmol/L (ref 96–106)
Creatinine, Ser: 0.86 mg/dL (ref 0.57–1.00)
Glucose: 222 mg/dL — ABNORMAL HIGH (ref 70–99)
Potassium: 4.3 mmol/L (ref 3.5–5.2)
Sodium: 138 mmol/L (ref 134–144)
eGFR: 74 mL/min/{1.73_m2} (ref >59)

## 2023-02-20 LAB — LIPID PANEL
Chol/HDL Ratio: 4 ratio (ref 0.0–4.4)
Cholesterol, Total: 174 mg/dL (ref 100–199)
HDL: 43 mg/dL (ref >39)
LDL Chol Calc (NIH): 91 mg/dL (ref 0–99)
Triglycerides: 239 mg/dL — ABNORMAL HIGH (ref 0–149)
VLDL Cholesterol Cal: 40 mg/dL (ref 5–40)

## 2023-02-20 LAB — HEPATIC FUNCTION PANEL
ALT: 38 IU/L — ABNORMAL HIGH (ref 0–32)
AST: 51 IU/L — ABNORMAL HIGH (ref 0–40)
Albumin: 4.2 g/dL (ref 3.9–4.9)
Alkaline Phosphatase: 74 IU/L (ref 44–121)
Bilirubin Total: 0.3 mg/dL (ref 0.0–1.2)
Bilirubin, Direct: 0.13 mg/dL (ref 0.00–0.40)
Total Protein: 6.8 g/dL (ref 6.0–8.5)

## 2023-02-22 ENCOUNTER — Telehealth (HOSPITAL_COMMUNITY): Payer: Self-pay | Admitting: *Deleted

## 2023-02-22 NOTE — Telephone Encounter (Signed)
Calling patient to schedule her cardiac CT scan. New appointment made for Monday, July 15 at 12 pm. She is aware to arrive at 11:30 am. Advised her to not take 25mg  metoprolol since her HR is generally in the 50's. She confirms possession of her cardiac CT instructions.  Larey Brick RN Navigator Cardiac Imaging [facility] [phone] Office [phone] Cell

## 2023-02-23 ENCOUNTER — Ambulatory Visit: Payer: Medicare HMO | Attending: Cardiology | Admitting: *Deleted

## 2023-02-23 ENCOUNTER — Other Ambulatory Visit: Payer: Self-pay | Admitting: *Deleted

## 2023-02-23 DIAGNOSIS — Z5181 Encounter for therapeutic drug level monitoring: Secondary | ICD-10-CM | POA: Diagnosis not present

## 2023-02-23 DIAGNOSIS — I4821 Permanent atrial fibrillation: Secondary | ICD-10-CM | POA: Diagnosis not present

## 2023-02-23 DIAGNOSIS — Z79899 Other long term (current) drug therapy: Secondary | ICD-10-CM

## 2023-02-23 LAB — POCT INR: INR: 1.8 — AB (ref 2.0–3.0)

## 2023-02-23 NOTE — Patient Instructions (Signed)
Take warfarin 1 1/2 tablets tonight then resume 1/2 tablet daily except 1 tablet on Sundays, Tuesdays and Thursdays. Recheck INR in 4 weeks.

## 2023-02-25 ENCOUNTER — Telehealth: Payer: Self-pay

## 2023-02-25 DIAGNOSIS — E1159 Type 2 diabetes mellitus with other circulatory complications: Secondary | ICD-10-CM

## 2023-02-25 NOTE — Telephone Encounter (Signed)
Patient left a voicemail at office that Gemtesa samples were too expensive for patient. Per patient voice mail- pharmacy recommended trying oxybutynin.  Will have CMA return call to patient to inquire if drug is in a higher tier, if so we can request tier exception to help lower cost.

## 2023-02-25 NOTE — Telephone Encounter (Signed)
Requesting clarification for metformin dosage. Last office visit note states 500mg  daily. The last Rx sent in on 4/16 states 500mg  bid with meals. Pt also stated she is not going to be able to continue to take Ozempic due to cost which will be over $300.00.

## 2023-02-26 ENCOUNTER — Telehealth (HOSPITAL_COMMUNITY): Payer: Self-pay | Admitting: Emergency Medicine

## 2023-02-26 ENCOUNTER — Encounter (HOSPITAL_COMMUNITY): Payer: Self-pay

## 2023-02-26 DIAGNOSIS — M545 Low back pain, unspecified: Secondary | ICD-10-CM | POA: Diagnosis not present

## 2023-02-26 NOTE — Telephone Encounter (Signed)
Reaching out to patient to offer assistance regarding upcoming cardiac imaging study; pt verbalizes understanding of appt date/time, parking situation and where to check in, pre-test NPO status and medications ordered, and verified current allergies; name and call back number provided for further questions should they arise Carlos Quackenbush RN Navigator Cardiac Imaging [facility] [phone] office [phone] cell 

## 2023-02-26 NOTE — Telephone Encounter (Signed)
Tried calling patient with no answer, left detail voiced message making patient aware of recommendation.

## 2023-03-01 ENCOUNTER — Ambulatory Visit (HOSPITAL_COMMUNITY)
Admission: RE | Admit: 2023-03-01 | Discharge: 2023-03-01 | Disposition: A | Discharge status: Home / Self Care | Payer: Medicare HMO | Source: Ambulatory Visit | Attending: Physician Assistant | Admitting: Physician Assistant

## 2023-03-01 ENCOUNTER — Telehealth: Payer: Self-pay

## 2023-03-01 ENCOUNTER — Other Ambulatory Visit: Payer: Self-pay | Admitting: Cardiovascular Disease

## 2023-03-01 ENCOUNTER — Ambulatory Visit (HOSPITAL_BASED_OUTPATIENT_CLINIC_OR_DEPARTMENT_OTHER)
Admission: RE | Admit: 2023-03-01 | Discharge: 2023-03-01 | Disposition: A | Discharge status: Home / Self Care | Payer: Medicare HMO | Source: Ambulatory Visit | Attending: Cardiovascular Disease | Admitting: Cardiovascular Disease

## 2023-03-01 DIAGNOSIS — R55 Syncope and collapse: Secondary | ICD-10-CM | POA: Insufficient documentation

## 2023-03-01 DIAGNOSIS — I700 Atherosclerosis of aorta: Secondary | ICD-10-CM | POA: Diagnosis not present

## 2023-03-01 DIAGNOSIS — R931 Abnormal findings on diagnostic imaging of heart and coronary circulation: Secondary | ICD-10-CM | POA: Diagnosis not present

## 2023-03-01 DIAGNOSIS — I2510 Atherosclerotic heart disease of native coronary artery without angina pectoris: Secondary | ICD-10-CM

## 2023-03-01 DIAGNOSIS — R0602 Shortness of breath: Secondary | ICD-10-CM | POA: Diagnosis not present

## 2023-03-01 DIAGNOSIS — E119 Type 2 diabetes mellitus without complications: Secondary | ICD-10-CM | POA: Diagnosis not present

## 2023-03-01 DIAGNOSIS — I10 Essential (primary) hypertension: Secondary | ICD-10-CM | POA: Diagnosis not present

## 2023-03-01 DIAGNOSIS — R079 Chest pain, unspecified: Secondary | ICD-10-CM | POA: Diagnosis not present

## 2023-03-01 DIAGNOSIS — I4891 Unspecified atrial fibrillation: Secondary | ICD-10-CM | POA: Diagnosis not present

## 2023-03-01 MED ORDER — NITROGLYCERIN 0.4 MG SL SUBL
0.8000 mg | SUBLINGUAL_TABLET | SUBLINGUAL | Status: DC | PRN
  Administered 2023-03-01: 0.8 mg via SUBLINGUAL

## 2023-03-01 MED ORDER — NITROGLYCERIN 0.4 MG SL SUBL
SUBLINGUAL_TABLET | SUBLINGUAL | Status: AC
  Filled 2023-03-01: qty 2

## 2023-03-01 MED ORDER — METFORMIN HCL 500 MG PO TABS
500.0000 mg | ORAL_TABLET | Freq: Two times a day (BID) | ORAL | 0 refills | Status: AC
Start: 2023-03-01 — End: ?

## 2023-03-01 MED ORDER — IOHEXOL 350 MG/ML SOLN
100.0000 mL | Freq: Once | INTRAVENOUS | Status: AC | PRN
  Administered 2023-03-01: 100 mL via INTRAVENOUS

## 2023-03-01 NOTE — Telephone Encounter (Signed)
Patient calling back regarding gemtesa replacement.    Please advise.

## 2023-03-01 NOTE — Telephone Encounter (Signed)
Patient return call regarding tier exception. Patient is aware I will get with Kourtney on the tier exception. Patient voiced understanding

## 2023-03-01 NOTE — Telephone Encounter (Signed)
Please advise 

## 2023-03-01 NOTE — Telephone Encounter (Signed)
Discussed with pt, understanding voiced. Rx for metformin 500mg  po bid sent to [facility] per Dr.Nida's orders.

## 2023-03-01 NOTE — Telephone Encounter (Signed)
 Open in error

## 2023-03-04 ENCOUNTER — Ambulatory Visit: Payer: Medicare HMO | Admitting: "Endocrinology

## 2023-03-04 ENCOUNTER — Encounter: Payer: Self-pay | Admitting: "Endocrinology

## 2023-03-04 VITALS — BP 122/56 | HR 68 | Ht 64.0 in | Wt 226.8 lb

## 2023-03-04 DIAGNOSIS — K760 Fatty (change of) liver, not elsewhere classified: Secondary | ICD-10-CM

## 2023-03-04 DIAGNOSIS — Z794 Long term (current) use of insulin: Secondary | ICD-10-CM | POA: Diagnosis not present

## 2023-03-04 DIAGNOSIS — Z6838 Body mass index (BMI) 38.0-38.9, adult: Secondary | ICD-10-CM | POA: Diagnosis not present

## 2023-03-04 DIAGNOSIS — I10 Essential (primary) hypertension: Secondary | ICD-10-CM

## 2023-03-04 DIAGNOSIS — I152 Hypertension secondary to endocrine disorders: Secondary | ICD-10-CM | POA: Diagnosis not present

## 2023-03-04 DIAGNOSIS — E559 Vitamin D deficiency, unspecified: Secondary | ICD-10-CM

## 2023-03-04 DIAGNOSIS — E1159 Type 2 diabetes mellitus with other circulatory complications: Secondary | ICD-10-CM

## 2023-03-04 DIAGNOSIS — E782 Mixed hyperlipidemia: Secondary | ICD-10-CM | POA: Diagnosis not present

## 2023-03-04 DIAGNOSIS — E039 Hypothyroidism, unspecified: Secondary | ICD-10-CM

## 2023-03-04 LAB — POCT GLYCOSYLATED HEMOGLOBIN (HGB A1C): HbA1c, POC (controlled diabetic range): 10.2 % — AB (ref 0.0–7.0)

## 2023-03-04 MED ORDER — TRESIBA FLEXTOUCH 200 UNIT/ML ~~LOC~~ SOPN
100.0000 [IU] | PEN_INJECTOR | Freq: Every day | SUBCUTANEOUS | 0 refills | Status: DC
Start: 2023-03-04 — End: 2023-07-07

## 2023-03-04 NOTE — Patient Instructions (Signed)

## 2023-03-04 NOTE — Progress Notes (Signed)
03/04/2023, 3:39 PM   Endocrinology follow-up note  Subjective:    Patient ID: Rachel Odonnell, female    DOB: 1955/11/26.  Rachel Odonnell is being seen in follow-up after she was seen in consultation for  management of currently uncontrolled, symptomatic type 2 diabetes requested by  Royann Shivers, PA-C.   Past Medical History:  Diagnosis Date   ADD (attention deficit disorder)    Diabetes mellitus without complication (HCC)    Hypertension     Past Surgical History:  Procedure Laterality Date   ABDOMINAL HYSTERECTOMY     COLONOSCOPY  02/2022   EXCISIONAL HEMORRHOIDECTOMY     INCONTINENCE SURGERY     RIGHT/LEFT HEART CATH AND CORONARY ANGIOGRAPHY N/A 06/13/2021   Procedure: RIGHT/LEFT HEART CATH AND CORONARY ANGIOGRAPHY;  Surgeon: [place], Peter M, MD;  Location: [facility] INVASIVE CV LAB;  Service: Cardiovascular;  Laterality: N/A;    Social History   Socioeconomic History   Marital status: Married    Spouse name: Shon Hale   Number of children: Not on file   Years of education: Not on file   Highest education level: Not on file  Occupational History   Not on file  Tobacco Use   Smoking status: Former    Current packs/day: 0.00    Types: Cigarettes    Quit date: 08/17/1994    Years since quitting: 28.5   Smokeless tobacco: Never  Vaping Use   Vaping status: Never Used  Substance and Sexual Activity   Alcohol use: No   Drug use: No   Sexual activity: Yes  Other Topics Concern   Not on file  Social History Narrative   Lives at home with husband, Shon Hale   Former smoker Smoked 2 ppd x 25 yrs; Quit in 1996   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, retail  retired Secondary school teacher, still works part-time at the [employer], used to work Editor, commissioning   Now on fixed income from her SSI and pension   Social Determinants of Health   Financial Resource Strain: Low Risk   (10/10/2021)   Overall Financial Resource Strain (CARDIA)    Difficulty of Paying Living Expenses: Not hard at all  Food Insecurity: No Food Insecurity (10/10/2021)   Hunger Vital Sign    Worried About Running Out of Food in the Last Year: Never true    Ran Out of Food in the Last Year: Never true  Transportation Needs: No Transportation Needs (12/31/2021)   Received from [facility], [facility]   PRAPARE - Transportation    Lack of Transportation (Medical): No    Lack of Transportation (Non-Medical): No  Physical Activity: Unknown (04/18/2021)   Exercise Vital Sign    Days of Exercise per Week: 1 day    Minutes of Exercise per Session: Not on file  Stress: No Stress Concern Present (10/10/2021)   [employer] of Occupational Health - Occupational Stress Questionnaire    Feeling of Stress : Only a little  Social Connections: Socially Integrated (10/10/2021)   Social Connection and Isolation Panel [NHANES]    Frequency of Communication with Friends  and Family: More than three times a week    Frequency of Social Gatherings with Friends and Family: More than three times a week    Attends Religious Services: More than 4 times per year    Active Member of [employer] or Organizations: Yes    Attends Engineer, structural: More than 4 times per year    Marital Status: Married    Family History  Problem Relation Age of Onset   Cancer Mother    Diabetes Father    Heart Problems Father    COPD Father    Emphysema Father    Heart block Father    Congestive Heart Failure Father     Outpatient Encounter Medications as of 03/04/2023  Medication Sig   Accu-Chek Softclix Lancets lancets Use as instructed to test blood glucose four times daily   busPIRone (BUSPAR) 5 MG tablet Take 5 mg by mouth at bedtime.   Cholecalciferol (VITAMIN D3) 125 MCG (5000 UT) CAPS Take 1 capsule (5,000 Units total) by mouth daily.   citalopram (CELEXA) 40 MG tablet Take 40 mg by mouth every evening.    Continuous Blood Gluc Sensor (FREESTYLE LIBRE 2 SENSOR) MISC Change sensor every 14 days. Use to check glucose continuously   Continuous Glucose Receiver (FREESTYLE LIBRE 2 READER) DEVI USE TO CHECK GLUCOSE AS DIRECTED   donepezil (ARICEPT) 5 MG tablet SMARTSIG:1 Tablet(s) By Mouth Every Evening   estradiol (ESTRACE) 2 MG tablet Take 2 mg by mouth every evening.   fenofibrate (TRICOR) 145 MG tablet Take 145 mg by mouth every evening.   gabapentin (NEURONTIN) 100 MG capsule Take 1 capsule (100 mg total) by mouth 3 (three) times daily.   glipiZIDE (GLUCOTROL) 5 MG tablet Take 1 tablet (5 mg total) by mouth daily before breakfast.   glucose blood test strip 1 each by Other route as needed. Use as instructed to test blood glucose four times daily   indapamide (LOZOL) 2.5 MG tablet Take 1 tablet (2.5 mg total) by mouth daily.   insulin aspart (NOVOLOG FLEXPEN) 100 UNIT/ML FlexPen Inject 10-16 Units into the skin 3 (three) times daily before meals.   insulin degludec (TRESIBA FLEXTOUCH) 200 UNIT/ML FlexTouch Pen Inject 100 Units into the skin at bedtime.   Insulin Pen Needle (B-D ULTRAFINE III SHORT PEN) 31G X 8 MM MISC 1 each by Does not apply route as directed.   isosorbide mononitrate (IMDUR) 60 MG 24 hr tablet Take 1 tablet (60 mg total) by mouth daily.   levocetirizine (XYZAL) 5 MG tablet Take 5 mg by mouth every evening.   levothyroxine (SYNTHROID) 50 MCG tablet Take 1 tablet (50 mcg total) by mouth daily before breakfast.   metFORMIN (GLUCOPHAGE) 500 MG tablet Take 1 tablet (500 mg total) by mouth 2 (two) times daily with a meal.   metoprolol tartrate (LOPRESSOR) 25 MG tablet Take 1 tablet (25 mg total) by mouth once for 1 dose. Take 2 hrs prior to CT   modafinil (PROVIGIL) 100 MG tablet Take 1 tablet (100 mg total) by mouth daily.   montelukast (SINGULAIR) 10 MG tablet Take 10 mg by mouth at bedtime.   Multiple Vitamin (MULTIVITAMIN ADULT PO) Take 1 tablet by mouth daily.   Omega-3 Fatty  Acids (FISH OIL) 1000 MG CAPS Take 1,000 mg by mouth 2 (two) times daily. Morning and evening   omeprazole (PRILOSEC) 40 MG capsule Take 40 mg by mouth every evening.   rosuvastatin (CRESTOR) 20 MG tablet Take 1 tablet (20  mg total) by mouth daily. (Patient taking differently: Take 40 mg by mouth daily.)   Semaglutide,0.25 or 0.5MG /DOS, (OZEMPIC, 0.25 OR 0.5 MG/DOSE,) 2 MG/1.5ML SOPN Inject 0.25 mg into the skin once a week.   Vibegron (GEMTESA) 75 MG TABS Take 1 tablet (75 mg total) by mouth daily.   warfarin (COUMADIN) 5 MG tablet Take 1/2 a tablet to 1 tablet by mouth daily as directed by the coumadin clinic.   [DISCONTINUED] insulin degludec (TRESIBA FLEXTOUCH) 200 UNIT/ML FlexTouch Pen Inject 100 Units into the skin at bedtime. (Patient taking differently: Inject 90 Units into the skin at bedtime.)   No facility-administered encounter medications on file as of 03/04/2023.    ALLERGIES: Allergies  Allergen Reactions   Azithromycin Hives   Codeine Nausea Only   Atomoxetine Nausea And Vomiting   Atorvastatin Other (See Comments)    Restless legs   Lisinopril Nausea And Vomiting    VACCINATION STATUS: Immunization History  Administered Date(s) Administered   Fluad Quad(high Dose 65+) 05/14/2021   Moderna SARS-COV2 Booster Vaccination 06/13/2020   Moderna Sars-Covid-2 Vaccination 09/06/2019, 10/04/2019   Pneumococcal Polysaccharide-23 04/22/2017   Pneumococcal-Unspecified 05/31/2020   Tdap 04/22/2017   Zoster Recombinant(Shingrix) 11/30/2017, 02/20/2018    Diabetes She presents for her follow-up diabetic visit. She has type 2 diabetes mellitus. Onset time: Patient was diagnosed at approximate age of 49 years. Her disease course has been improving. There are no hypoglycemic associated symptoms. Pertinent negatives for hypoglycemia include no confusion, headaches, pallor or seizures. Pertinent negatives for diabetes include no blurred vision, no chest pain, no fatigue, no polydipsia,  no polyphagia and no polyuria. There are no hypoglycemic complications. Symptoms are improving. Diabetic complications include heart disease and peripheral neuropathy. Risk factors for coronary artery disease include dyslipidemia, diabetes mellitus, family history, obesity, post-menopausal, sedentary lifestyle, tobacco exposure and hypertension. Current diabetic treatment includes insulin injections and oral agent (dual therapy) (.). Her weight is fluctuating minimally. She is following a generally unhealthy diet. When asked about meal planning, she reported none. She never participates in exercise. Her home blood glucose trend is decreasing steadily. Her breakfast blood glucose range is generally >200 mg/dl. Her lunch blood glucose range is generally >200 mg/dl. Her dinner blood glucose range is generally >200 mg/dl. Her bedtime blood glucose range is generally >200 mg/dl. Her overall blood glucose range is >200 mg/dl. (She presents with her CGM . Her AGP report shows improvement in her glucose profile including 22% time in range, 54%  level 1 hyperglycemia, 44% level 2 hyperglycemia.  During her last visit her AGP report showed 2% time in range, 20% level 1 hyperglycemia, 78% level 2  hyperglycemia.   Her point-of-care A1c today is still high at 10.2% She did not document significant hypoglycemia.  ) An ACE inhibitor/angiotensin II receptor blocker is not being taken. Eye exam is current.  Hyperlipidemia This is a chronic problem. The current episode started more than 1 year ago. The problem is uncontrolled. Exacerbating diseases include diabetes, hypothyroidism and obesity. Pertinent negatives include no chest pain, myalgias or shortness of breath. Current antihyperlipidemic treatment includes statins, fibric acid derivatives and bile acid sequestrants. Risk factors for coronary artery disease include family history, dyslipidemia, obesity, a sedentary lifestyle, post-menopausal, hypertension and diabetes  mellitus.  Hypertension This is a chronic problem. The current episode started more than 1 year ago. Pertinent negatives include no blurred vision, chest pain, headaches, palpitations or shortness of breath. Risk factors for coronary artery disease include dyslipidemia, diabetes mellitus, family history,  obesity, post-menopausal state, smoking/tobacco exposure and sedentary lifestyle. Past treatments include direct vasodilators. Hypertensive end-organ damage includes CAD/MI.    Review of systems  Constitutional: + Minimally fluctuating body weight ,  current Body mass index is 38.93 kg/m. , no fatigue, no subjective hyperthermia, no subjective hypothermia Eyes: no blurry vision, no xerophthalmia    Objective:    BP (!) 122/56   Pulse 68   Ht 5\' 4"  (1.626 m)   Wt 226 lb 12.8 oz (102.9 kg)   BMI 38.93 kg/m   Wt Readings from Last 3 Encounters:  03/04/23 226 lb 12.8 oz (102.9 kg)  02/19/23 228 lb 9.6 oz (103.7 kg)  02/16/23 225 lb (102.1 kg)    BP Readings from Last 3 Encounters:  03/04/23 (!) 122/56  03/01/23 (!) 125/50  02/19/23 (!) 116/50     Physical Exam- Limited  Constitutional:  Body mass index is 38.93 kg/m. , not in acute distress, normal state of mind Eyes:  EOMI, no exophthalmos    CMP ( most recent) CMP     Component Value Date/Time   NA 138 02/19/2023 1032   K 4.3 02/19/2023 1032   CL 99 02/19/2023 1032   CO2 26 02/19/2023 1032   GLUCOSE 222 (H) 02/19/2023 1032   GLUCOSE 237 (H) 02/16/2023 1746   BUN 11 02/19/2023 1032   CREATININE 0.86 02/19/2023 1032   CALCIUM 10.9 (H) 02/19/2023 1032   PROT 6.8 02/19/2023 1032   ALBUMIN 4.2 02/19/2023 1032   AST 51 (H) 02/19/2023 1032   ALT 38 (H) 02/19/2023 1032   ALKPHOS 74 02/19/2023 1032   BILITOT 0.3 02/19/2023 1032   GFRNONAA >60 02/16/2023 1746   GFRAA >90 07/09/2012 1736    Recent Results (from the past 2160 hour(s))  POCT INR     Status: Abnormal   Collection Time: 12/15/22  3:33 PM  Result  Value Ref Range   INR 4.3 (A) 2.0 - 3.0   POC INR    POCT INR     Status: Normal   Collection Time: 12/29/22  3:27 PM  Result Value Ref Range   INR 2.5 2.0 - 3.0   POC INR    POCT INR     Status: Normal   Collection Time: 01/26/23  3:03 PM  Result Value Ref Range   INR 2.8 2.0 - 3.0   POC INR    Urinalysis, Routine w reflex microscopic     Status: Abnormal   Collection Time: 02/16/23  3:53 PM  Result Value Ref Range   Specific Gravity, UA 1.025 1.005 - 1.030   pH, UA 7.0 5.0 - 7.5   Color, UA Yellow Yellow   Appearance Ur Clear Clear   Leukocytes,UA Negative Negative   Protein,UA Negative Negative/Trace   Glucose, UA 1+ (A) Negative   Ketones, UA Negative Negative   RBC, UA Negative Negative   Bilirubin, UA Negative Negative   Urobilinogen, Ur 4.0 (H) 0.2 - 1.0 mg/dL   Nitrite, UA Negative Negative   Microscopic Examination Comment     Comment: Microscopic not indicated and not performed.  CBC with Differential     Status: None   Collection Time: 02/16/23  5:46 PM  Result Value Ref Range   WBC 7.6 4.0 - 10.5 K/uL   RBC 4.39 3.87 - 5.11 MIL/uL   Hemoglobin 13.8 12.0 - 15.0 g/dL   HCT 78.2 95.6 - 21.3 %   MCV 97.0 80.0 - 100.0 fL   MCH 31.4 26.0 -  34.0 pg   MCHC 32.4 30.0 - 36.0 g/dL   RDW 16.1 09.6 - 04.5 %   Platelets 281 150 - 400 K/uL   nRBC 0.0 0.0 - 0.2 %   Neutrophils Relative % 37 %   Neutro Abs 2.8 1.7 - 7.7 K/uL   Lymphocytes Relative 52 %   Lymphs Abs 4.0 0.7 - 4.0 K/uL   Monocytes Relative 8 %   Monocytes Absolute 0.6 0.1 - 1.0 K/uL   Eosinophils Relative 1 %   Eosinophils Absolute 0.1 0.0 - 0.5 K/uL   Basophils Relative 1 %   Basophils Absolute 0.1 0.0 - 0.1 K/uL   Immature Granulocytes 1 %   Abs Immature Granulocytes 0.04 0.00 - 0.07 K/uL    Comment: Performed at [facility], [address] [place], [place] [address]  Basic metabolic panel     Status: Abnormal   Collection Time: 02/16/23  5:46 PM  Result Value Ref Range   Sodium 134 (L) 135 -  145 mmol/L   Potassium 3.8 3.5 - 5.1 mmol/L   Chloride 98 98 - 111 mmol/L   CO2 27 22 - 32 mmol/L   Glucose, Bld 237 (H) 70 - 99 mg/dL    Comment: Glucose reference range applies only to samples taken after fasting for at least 8 hours.   BUN 16 8 - 23 mg/dL   Creatinine, Ser 1.91 0.44 - 1.00 mg/dL   Calcium 9.7 8.9 - 47.8 mg/dL   GFR, Estimated >29 >56 mL/min    Comment: (NOTE) Calculated using the CKD-EPI Creatinine Equation (2021)    Anion gap 9 5 - 15    Comment: Performed at [facility], [address], [place], [place] [address]  D-dimer, quantitative     Status: None   Collection Time: 02/16/23  5:46 PM  Result Value Ref Range   D-Dimer, Quant <0.27 0.00 - 0.50 ug/mL-FEU    Comment: (NOTE) At the manufacturer cut-off value of 0.5 g/mL FEU, this assay has a negative predictive value of 95-100%.This assay is intended for use in conjunction with a clinical pretest probability (PTP) assessment model to exclude pulmonary embolism (PE) and deep venous thrombosis (DVT) in outpatients suspected of PE or DVT. Results should be correlated with clinical presentation. Performed at [facility], [address], [place], [place] [address]   Hepatic function panel     Status: Abnormal   Collection Time: 02/16/23  5:46 PM  Result Value Ref Range   Total Protein 7.4 6.5 - 8.1 g/dL   Albumin 3.8 3.5 - 5.0 g/dL   AST 44 (H) 15 - 41 U/L   ALT 34 0 - 44 U/L   Alkaline Phosphatase 57 38 - 126 U/L   Total Bilirubin 0.6 0.3 - 1.2 mg/dL   Bilirubin, Direct <6.9 0.0 - 0.2 mg/dL   Indirect Bilirubin NOT CALCULATED 0.3 - 0.9 mg/dL    Comment: Performed at [facility], [address] [place], [place] [address]  Urinalysis, Routine w reflex microscopic -Urine, Clean Catch     Status: Abnormal   Collection Time: 02/16/23  9:49 PM  Result Value Ref Range   Color, Urine STRAW (A) YELLOW   APPearance CLEAR CLEAR   Specific Gravity, Urine 1.003 (L) 1.005 - 1.030   pH 7.0 5.0 - 8.0   Glucose, UA  NEGATIVE NEGATIVE mg/dL   Hgb urine dipstick SMALL (A) NEGATIVE   Bilirubin Urine NEGATIVE NEGATIVE   Ketones, ur NEGATIVE NEGATIVE mg/dL   Protein, ur NEGATIVE  NEGATIVE mg/dL   Nitrite NEGATIVE NEGATIVE   Leukocytes,Ua NEGATIVE NEGATIVE   RBC / HPF 0-5 0 - 5 RBC/hpf   WBC, UA 0-5 0 - 5 WBC/hpf   Bacteria, UA RARE (A) NONE SEEN   Squamous Epithelial / HPF 0-5 0 - 5 /HPF    Comment: Performed at [facility], [address] [place], [place] [address]  Basic metabolic panel     Status: Abnormal   Collection Time: 02/19/23 10:32 AM  Result Value Ref Range   Glucose 222 (H) 70 - 99 mg/dL   BUN 11 8 - 27 mg/dL   Creatinine, Ser 6.04 0.57 - 1.00 mg/dL   eGFR 74 >54 UJ/WJX/9.14   BUN/Creatinine Ratio 13 12 - 28   Sodium 138 134 - 144 mmol/L   Potassium 4.3 3.5 - 5.2 mmol/L   Chloride 99 96 - 106 mmol/L   CO2 26 20 - 29 mmol/L   Calcium 10.9 (H) 8.7 - 10.3 mg/dL  Hepatic function panel     Status: Abnormal   Collection Time: 02/19/23 10:32 AM  Result Value Ref Range   Total Protein 6.8 6.0 - 8.5 g/dL   Albumin 4.2 3.9 - 4.9 g/dL   Bilirubin Total 0.3 0.0 - 1.2 mg/dL   Bilirubin, Direct 7.82 0.00 - 0.40 mg/dL   Alkaline Phosphatase 74 44 - 121 IU/L   AST 51 (H) 0 - 40 IU/L   ALT 38 (H) 0 - 32 IU/L  Lipid panel     Status: Abnormal   Collection Time: 02/19/23 10:32 AM  Result Value Ref Range   Cholesterol, Total 174 100 - 199 mg/dL   Triglycerides 956 (H) 0 - 149 mg/dL   HDL 43 >21 mg/dL   VLDL Cholesterol Cal 40 5 - 40 mg/dL   LDL Chol Calc (NIH) 91 0 - 99 mg/dL   Chol/HDL Ratio 4.0 0.0 - 4.4 ratio    Comment:                                   T. Chol/HDL Ratio                                             Men  Women                               1/2 Avg.Risk  3.4    3.3                                   Avg.Risk  5.0    4.4                                2X Avg.Risk  9.6    7.1                                3X Avg.Risk 23.4   11.0   POCT INR     Status: Abnormal    Collection Time: 02/23/23  3:08 PM  Result Value Ref Range   INR 1.8 (A) 2.0 -  3.0   POC INR    HgB A1c     Status: Abnormal   Collection Time: 03/04/23  1:09 PM  Result Value Ref Range   Hemoglobin A1C     HbA1c POC (<> result, manual entry)     HbA1c, POC (prediabetic range)     HbA1c, POC (controlled diabetic range) 10.2 (A) 0.0 - 7.0 %     Assessment & Plan:   1) DM type 2 causing vascular disease (HCC)  - Rachel Odonnell has currently uncontrolled symptomatic type 2 DM since  67 years of age.  She presents with her CGM . Her AGP report shows improvement in her glucose profile including 22% time in range, 54%  level 1 hyperglycemia, 44% level 2 hyperglycemia.  During her last visit her AGP report showed 2% time in range, 20% level 1 hyperglycemia, 78% level 2  hyperglycemia.   Her point-of-care A1c today is still high at 10.2% She did not document significant hypoglycemia.   Recent labs reviewed.  - I had a long discussion with her about the progressive nature of diabetes and the pathology behind its complications. -her diabetes is complicated by coronary artery disease, peripheral neuropathy, obesity, sedentary life, nonalcoholic fatty liver disease and she remains at extremely  high risk for more acute and chronic complications which include CAD, CVA, CKD, retinopathy, and neuropathy. These are all discussed in detail with her.  - I discussed all available options of managing her diabetes including de-escalation of medications.  Considering her associated comorbidities including sleep apnea requiring CPAP, nonalcoholic fatty liver disease, hypertension, hyperlipidemia, she is a perfect candidate for lifestyle medicine.    I have counseled her on diet  and weight management  by adopting a Whole Food , Plant Predominant  ( WFPP) nutrition as recommended by [employer] of Lifestyle Medicine. Patient is encouraged to switch to  unprocessed or minimally processed  complex starch,  adequate protein intake (mainly plant source), minimal liquid fat ( mainly vegetable oils), plenty of fruits, and vegetables. -  she is advised to stick to a routine mealtimes to eat 3 complete meals a day and snack only when necessary ( to snack only to correct hypoglycemia BG <70 day time or <100 at night).   -Her engagement with lifestyle medicine nutrition is suboptimal, however,  she acknowledges that there is a room for improvement in her food and drink choices. - Suggestion is made for her to avoid simple carbohydrates  from her diet including Cakes, Sweet Desserts, Ice Cream, Soda (diet and regular), Sweet Tea, Candies, Chips, Cookies, Store Bought Juices, Alcohol , Artificial Sweeteners,  Coffee Creamer, and "Sugar-free" Products, Lemonade. This will help patient to have more stable blood glucose profile and potentially avoid unintended weight gain.  The following Lifestyle Medicine recommendations according to American College of Lifestyle Medicine  [facility]) were discussed and and offered to patient and she  agrees to start the journey:  A. Whole Foods, Plant-Based Nutrition comprising of fruits and vegetables, plant-based proteins, whole-grain carbohydrates was discussed in detail with the patient.   A list for source of those nutrients were also provided to the patient.  Patient will use only water or unsweetened tea for hydration. B.  The need to stay away from risky substances including alcohol, smoking; obtaining 7 to 9 hours of restorative sleep, at least 150 minutes of moderate intensity exercise weekly, the importance of healthy social connections,  and stress management techniques were discussed. C.  A full  color page of  Calorie density of various food groups per pound showing examples of each food groups was provided to the patient.   - she has been scheduled with Norm Salt, RDN, CDE for individualized diabetes education.  - I have approached her with the following plan to  manage  her diabetes and patient agrees:   -She will continue to need intensive treatment with basal/bolus insulin.  Accordingly, she is advised to continue Tresiba 100   units nightly, advised to continue NovoLog  NovoLog 10-16 units 3 times daily is for Premeal blood glucose readings above 90 mg per DL.  She is advised to continue metformin 500 mg p.o. daily, glipizide 5 mg p.o. daily.  She will not afford the co-pays of Ozempic if no she was benefiting from it.  This will be discontinued.    -She did not tolerate Jardiance in a prior attempt.  - Specific targets for  A1c;  LDL, HDL,  and Triglycerides were discussed with the patient.  2) Blood Pressure /Hypertension:   -Her blood pressure is controlled to target.  she is advised to continue her current medications including Imdur 60 mg p.o. daily with breakfast .  3) Lipids/Hyperlipidemia:    Her most recent lipid panel showed improved LDL to 91 from 120.    She is advised to continue on her current triple agent therapy including fenofibrate 145 mg p.o. daily, 35 acids 1000 mg p.o. twice daily, Crestor 10 mg p.o. daily at bedtime.   She has a chance to simplify this treatment with proper engagement in lifestyle medicine.  She is hesitant to engage with lifestyle medicine for now.  Side effects and precautions discussed with her.  4)  Weight/Diet:   Her Body mass index is 38.93 kg/m.  -   clearly complicating her diabetes care.   she is  a candidate for weight loss. I discussed with her the fact that loss of 5 - 10% of her  current body weight will have the most impact on her diabetes management.  The above detailed  ACLM recommendations for nutrition, exercise, sleep, social life, avoidance of risky substances, the need for restorative sleep   information will also detailed on discharge instructions.  5) Hypothyroidism:  The circumstances of her diagnosis are not available to review.  Her previsit thyroid function tests are consistent with  appropriate replacement.  She is advised to continue levothyroxine 50 mcg p.o. daily before breakfast.    - We discussed about the correct intake of her thyroid hormone, on empty stomach at fasting, with water, separated by at least 30 minutes from breakfast and other medications,  and separated by more than 4 hours from calcium, iron, multivitamins, acid reflux medications (PPIs). -Patient is made aware of the fact that thyroid hormone replacement is needed for life, dose to be adjusted by periodic monitoring of thyroid function tests.    6) Chronic Care/Health Maintenance: -she is on Statin medications and  is encouraged to initiate and continue to follow up with Ophthalmology, Dentist,  Podiatrist at least yearly or according to recommendations, and advised to   stay away from smoking. I have recommended yearly flu vaccine and pneumonia vaccine at least every 5 years; moderate intensity exercise for up to 150 minutes weekly; and  sleep for 7- 9 hours a day.  -She will need to have vitamin D supplement with vitamin D3 5000 units daily x90 days.  - she is advised to maintain close follow up with Wayland Denis  E, PA-C for primary care needs, as well as her other providers for optimal and coordinated care.   I spent  42  minutes in the care of the patient today including review of labs from CMP, Lipids, Thyroid Function, Hematology (current and previous including abstractions from other facilities); face-to-face time discussing  her blood glucose readings/logs, discussing hypoglycemia and hyperglycemia episodes and symptoms, medications doses, her options of short and long term treatment based on the latest standards of care / guidelines;  discussion about incorporating lifestyle medicine;  and documenting the encounter. Risk reduction counseling performed per USPSTF guidelines to reduce  obesity and cardiovascular risk factors.     Please refer to Patient Instructions for Blood Glucose  Monitoring and Insulin/Medications Dosing Guide"  in media tab for additional information. Please  also refer to " Patient Self Inventory" in the Media  tab for reviewed elements of pertinent patient history.  Rachel Odonnell participated in the discussions, expressed understanding, and voiced agreement with the above plans.  All questions were answered to her satisfaction. she is encouraged to contact clinic should she have any questions or concerns prior to her return visit.    Follow up plan: - Return in about 3 months (around 06/04/2023) for Bring Meter/CGM Device/Logs- A1c in Office.  Ronny Bacon, [facility] [address] [place], [place] [address] Phone: [phone] Fax: [phone]  03/04/2023, 3:39 PM

## 2023-03-08 DIAGNOSIS — M9902 Segmental and somatic dysfunction of thoracic region: Secondary | ICD-10-CM | POA: Diagnosis not present

## 2023-03-08 DIAGNOSIS — S233XXA Sprain of ligaments of thoracic spine, initial encounter: Secondary | ICD-10-CM | POA: Diagnosis not present

## 2023-03-08 DIAGNOSIS — M9903 Segmental and somatic dysfunction of lumbar region: Secondary | ICD-10-CM | POA: Diagnosis not present

## 2023-03-08 DIAGNOSIS — M9901 Segmental and somatic dysfunction of cervical region: Secondary | ICD-10-CM | POA: Diagnosis not present

## 2023-03-08 DIAGNOSIS — S134XXA Sprain of ligaments of cervical spine, initial encounter: Secondary | ICD-10-CM | POA: Diagnosis not present

## 2023-03-08 DIAGNOSIS — S338XXA Sprain of other parts of lumbar spine and pelvis, initial encounter: Secondary | ICD-10-CM | POA: Diagnosis not present

## 2023-03-16 ENCOUNTER — Ambulatory Visit: Payer: Medicare HMO

## 2023-03-16 ENCOUNTER — Ambulatory Visit: Payer: Medicare HMO | Attending: Physician Assistant

## 2023-03-16 DIAGNOSIS — Z79899 Other long term (current) drug therapy: Secondary | ICD-10-CM

## 2023-03-16 DIAGNOSIS — R55 Syncope and collapse: Secondary | ICD-10-CM | POA: Diagnosis not present

## 2023-03-16 DIAGNOSIS — M545 Low back pain, unspecified: Secondary | ICD-10-CM | POA: Diagnosis not present

## 2023-03-16 LAB — ECHOCARDIOGRAM COMPLETE
AR max vel: 1.36 cm2
AV Area VTI: 1.55 cm2
AV Area mean vel: 1.38 cm2
AV Mean grad: 12 mmHg
AV Peak grad: 21.4 mmHg
Ao pk vel: 2.32 m/s
Area-P 1/2: 3.34 cm2
S' Lateral: 2.8 cm

## 2023-03-17 ENCOUNTER — Other Ambulatory Visit: Payer: Self-pay | Admitting: Nurse Practitioner

## 2023-03-17 DIAGNOSIS — E039 Hypothyroidism, unspecified: Secondary | ICD-10-CM

## 2023-03-23 ENCOUNTER — Ambulatory Visit: Payer: Medicare HMO | Attending: Cardiology | Admitting: *Deleted

## 2023-03-23 DIAGNOSIS — Z5181 Encounter for therapeutic drug level monitoring: Secondary | ICD-10-CM | POA: Diagnosis not present

## 2023-03-23 DIAGNOSIS — I4821 Permanent atrial fibrillation: Secondary | ICD-10-CM | POA: Diagnosis not present

## 2023-03-23 LAB — POCT INR: INR: 3.4 — AB (ref 2.0–3.0)

## 2023-03-23 NOTE — Patient Instructions (Signed)
Hold warfarin tonight then resume 1/2 tablet daily except 1 tablet on Sundays, Tuesdays and Thursdays. Recheck INR in 3 weeks.

## 2023-03-24 DIAGNOSIS — M47816 Spondylosis without myelopathy or radiculopathy, lumbar region: Secondary | ICD-10-CM | POA: Diagnosis not present

## 2023-03-26 ENCOUNTER — Telehealth: Payer: Self-pay

## 2023-03-26 NOTE — Telephone Encounter (Signed)
Discussed with pt, understanding voiced. 

## 2023-03-26 NOTE — Telephone Encounter (Signed)
Pt called stating she had a steroid injection in her back on Wednesday. States since then her BG readings have been between 300-400. States she had given herself 14 units tid yesterday. Also takes [place] 100 units at bedtime

## 2023-03-30 DIAGNOSIS — M9902 Segmental and somatic dysfunction of thoracic region: Secondary | ICD-10-CM | POA: Diagnosis not present

## 2023-03-30 DIAGNOSIS — M9903 Segmental and somatic dysfunction of lumbar region: Secondary | ICD-10-CM | POA: Diagnosis not present

## 2023-03-30 DIAGNOSIS — S338XXA Sprain of other parts of lumbar spine and pelvis, initial encounter: Secondary | ICD-10-CM | POA: Diagnosis not present

## 2023-03-30 DIAGNOSIS — S134XXA Sprain of ligaments of cervical spine, initial encounter: Secondary | ICD-10-CM | POA: Diagnosis not present

## 2023-03-30 DIAGNOSIS — M9901 Segmental and somatic dysfunction of cervical region: Secondary | ICD-10-CM | POA: Diagnosis not present

## 2023-03-30 DIAGNOSIS — S233XXA Sprain of ligaments of thoracic spine, initial encounter: Secondary | ICD-10-CM | POA: Diagnosis not present

## 2023-04-08 DIAGNOSIS — Z01419 Encounter for gynecological examination (general) (routine) without abnormal findings: Secondary | ICD-10-CM | POA: Diagnosis not present

## 2023-04-09 NOTE — Progress Notes (Unsigned)
Name: Rachel Odonnell DOB: 21-Oct-1955 MRN: [id number]  History of Present Illness: [name] is a 67 y.o. female who presents today for follow up visit at [facility] [place]. - GU History: 1. Kidney stones. - She has had ESWL twice, 2 ureteroscopies, and she has passed 3 other calculi.  - 09/22/2022: 24 hour urine shows good urine volume, high calcium, high uric acid.  - Taking indapamide 2.5mg  daily for stone prevention as per Dr. Ronne Binning. 2. Stress urinary incontinence (resolved). Mid-urethral sling implanted on 02/22/2012 by Dr. Myles Lipps. 3. OAB with urinary frequency, urgency, and urge incontinence.  4. Recurrent UTIs.  Urine culture results in past 12 months: - 08/26/2022: Positive for E. coli - 11/04/2022: Positive for E. coli - 11/10/2022: Negative   At last visit on 02/16/2023:  The plan was:  Maintain adequate fluid intake. Start Gemtesa 75 mg daily. Minimal caffeine intake. Return in about 8 weeks (around 04/13/2023) for UA, PVR, & f/u with Evette Georges NP. Return in 6 months with KUB and RUS for stone surveillance (scheduled on 08/03/2023).  Since last visit: Seen by cardiology for symptomatic bradycardia. She is scheduled for a cardiac cath procedure with possible stent placement on Friday 04/16/2023.  Today: She reports that the Gemtesa 75 mg samples were significantly helpful - she had a great decrease in her urinary urgency, frequency, and urge incontinence while taking that, however since running out of the samples she has been unable to continue Gemtesa due to cost.   She reports mild dysuria today and fatigue. Denies gross hematuria, straining to void, or sensations of incomplete emptying. Denies any recent stone passage.    Fall Screening: Do you usually have a device to assist in your mobility? No   Medications: Current Outpatient Medications  Medication Sig Dispense Refill   mirabegron ER (MYRBETRIQ) 25 MG TB24 tablet Take 1 tablet (25 mg  total) by mouth daily. 30 tablet 11   nitrofurantoin, macrocrystal-monohydrate, (MACROBID) 100 MG capsule Take 1 capsule (100 mg total) by mouth 2 (two) times daily for 7 days. 14 capsule 0   Accu-Chek Softclix Lancets lancets Use as instructed to test blood glucose four times daily 100 each 2   busPIRone (BUSPAR) 5 MG tablet Take 5 mg by mouth at bedtime.     Cholecalciferol (VITAMIN D3) 125 MCG (5000 UT) CAPS Take 1 capsule (5,000 Units total) by mouth daily. 90 capsule 0   citalopram (CELEXA) 40 MG tablet Take 40 mg by mouth every evening.     Continuous Blood Gluc Sensor (FREESTYLE LIBRE 2 SENSOR) MISC Change sensor every 14 days. Use to check glucose continuously 2 each 0   Continuous Glucose Receiver (FREESTYLE LIBRE 2 READER) DEVI USE TO CHECK GLUCOSE AS DIRECTED 1 each 0   donepezil (ARICEPT) 5 MG tablet Take 5 mg by mouth every evening.     estradiol (ESTRACE) 2 MG tablet Take 1 mg by mouth every evening.     fenofibrate (TRICOR) 145 MG tablet Take 145 mg by mouth every evening.     gabapentin (NEURONTIN) 100 MG capsule Take 1 capsule (100 mg total) by mouth 3 (three) times daily. (Patient taking differently: Take 100 mg by mouth at bedtime.) 90 capsule 5   glipiZIDE (GLUCOTROL) 5 MG tablet Take 1 tablet (5 mg total) by mouth daily before breakfast. 90 tablet 1   glucose blood test strip 1 each by Other route as needed. Use as instructed to test blood glucose four times daily 100 each  2   indapamide (LOZOL) 2.5 MG tablet Take 1 tablet (2.5 mg total) by mouth daily. 30 tablet 11   insulin aspart (NOVOLOG FLEXPEN) 100 UNIT/ML FlexPen Inject 10-16 Units into the skin 3 (three) times daily before meals. (Patient taking differently: Inject 10-16 Units into the skin 3 (three) times daily before meals. Sliding scale 90-150=10 units 151-200=11 units 201-250=12 units 251-300=13 units 301-400=14 units 400=16 units Over 400 call MD) 15 mL 0   insulin degludec (TRESIBA FLEXTOUCH) 200 UNIT/ML  FlexTouch Pen Inject 100 Units into the skin at bedtime. 45 mL 0   Insulin Pen Needle (B-D ULTRAFINE III SHORT PEN) 31G X 8 MM MISC 1 each by Does not apply route as directed. 100 each 2   isosorbide mononitrate (IMDUR) 60 MG 24 hr tablet Take 1 tablet (60 mg total) by mouth daily. 90 tablet 3   levocetirizine (XYZAL) 5 MG tablet Take 5 mg by mouth every evening.     levothyroxine (SYNTHROID) 50 MCG tablet TAKE 1 TABLET BY MOUTH ONCE DAILY BEFORE BREAKFAST 90 tablet 0   metFORMIN (GLUCOPHAGE) 500 MG tablet Take 1 tablet (500 mg total) by mouth 2 (two) times daily with a meal. 180 tablet 0   modafinil (PROVIGIL) 100 MG tablet Take 1 tablet (100 mg total) by mouth daily. 30 tablet 0   montelukast (SINGULAIR) 10 MG tablet Take 10 mg by mouth at bedtime.     Multiple Vitamin (MULTIVITAMIN ADULT PO) Take 1 tablet by mouth daily.     nitroGLYCERIN (NITROSTAT) 0.4 MG SL tablet Place 1 tablet (0.4 mg total) under the tongue every 5 (five) minutes as needed for chest pain. 25 tablet 3   omeprazole (PRILOSEC) 40 MG capsule Take 40 mg by mouth every evening.     OVER THE COUNTER MEDICATION Take 2 capsules by mouth daily. Omega XL     rosuvastatin (CRESTOR) 20 MG tablet Take 1 tablet (20 mg total) by mouth daily. (Patient taking differently: Take 40 mg by mouth daily.) 90 tablet 3   warfarin (COUMADIN) 5 MG tablet Take 1/2 a tablet to 1 tablet by mouth daily as directed by the coumadin clinic. (Patient taking differently: Take 2.5-5 mg by mouth See admin instructions. Take 2.5 mg Mon., Wed., Fri., and Sat. Take 5 mg Sun., Tues., and Thurs.  as directed by the coumadin clinic.) 90 tablet 0   No current facility-administered medications for this visit.    Allergies: Allergies  Allergen Reactions   Azithromycin Hives   Codeine Nausea Only   Atomoxetine Nausea And Vomiting   Atorvastatin Other (See Comments)    Restless legs   Lisinopril Nausea And Vomiting    Past Medical History:  Diagnosis Date    ADD (attention deficit disorder)    Diabetes mellitus without complication (HCC)    Hypertension    Past Surgical History:  Procedure Laterality Date   ABDOMINAL HYSTERECTOMY     COLONOSCOPY  02/2022   EXCISIONAL HEMORRHOIDECTOMY     INCONTINENCE SURGERY     RIGHT/LEFT HEART CATH AND CORONARY ANGIOGRAPHY N/A 06/13/2021   Procedure: RIGHT/LEFT HEART CATH AND CORONARY ANGIOGRAPHY;  Surgeon: [place], Peter M, MD;  Location: [facility] INVASIVE CV [facility];  Service: Cardiovascular;  Laterality: N/A;   Family History  Problem Relation Age of Onset   Cancer Mother    Diabetes Father    Heart Problems Father    COPD Father    Emphysema Father    Heart block Father    Congestive Heart Failure  Father    Social History   Socioeconomic History   Marital status: Married    Spouse name: Shon Hale   Number of children: Not on file   Years of education: Not on file   Highest education level: Not on file  Occupational History   Not on file  Tobacco Use   Smoking status: Former    Current packs/day: 0.00    Types: Cigarettes    Quit date: 08/17/1994    Years since quitting: 28.6   Smokeless tobacco: Never  Vaping Use   Vaping status: Never Used  Substance and Sexual Activity   Alcohol use: No   Drug use: No   Sexual activity: Yes  Other Topics Concern   Not on file  Social History Narrative   Lives at home with husband, Shon Hale   Former smoker Smoked 2 ppd x 25 yrs; Quit in 1996   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, retail  retired Secondary school teacher, still works part-time at the [employer], used to work Editor, commissioning   Now on fixed income from her SSI and pension   Social Determinants of Health   Financial Resource Strain: Low Risk  (10/10/2021)   Overall Financial Resource Strain (CARDIA)    Difficulty of Paying Living Expenses: Not hard at all  Food Insecurity: No Food Insecurity (10/10/2021)   Hunger Vital Sign    Worried About Running Out of Food in the Last  Year: Never true    Ran Out of Food in the Last Year: Never true  Transportation Needs: No Transportation Needs (12/31/2021)   Received from [id number], [facility]   PRAPARE - Transportation    Lack of Transportation (Medical): No    Lack of Transportation (Non-Medical): No  Physical Activity: Inactive (04/08/2023)   Received from [facility]   Exercise Vital Sign    Days of Exercise per Week: 0 days    Minutes of Exercise per Session: 0 min  Stress: No Stress Concern Present (10/10/2021)   [employer] of Occupational Health - Occupational Stress Questionnaire    Feeling of Stress : Only a little  Social Connections: Socially Integrated (10/10/2021)   Social Connection and Isolation Panel [NHANES]    Frequency of Communication with Friends and Family: More than three times a week    Frequency of Social Gatherings with Friends and Family: More than three times a week    Attends Religious Services: More than 4 times per year    Active Member of [employer] or Organizations: Yes    Attends Engineer, structural: More than 4 times per year    Marital Status: Married  Catering manager Violence: Not At Risk (04/08/2023)   Received from [facility]   Humiliation, Afraid, Rape, and Kick questionnaire    Fear of Current or Ex-Partner: No    Emotionally Abused: No    Physically Abused: No    Sexually Abused: No    SUBJECTIVE  Review of Systems Constitutional: Patient denies any unintentional weight loss or change in strength. Reports some fatigue. lntegumentary: Patient denies any rashes or pruritus Cardiovascular: Patient denies chest pain or syncope Respiratory: Patient denies shortness of breath Gastrointestinal: Patient denies nausea, vomiting, constipation, or diarrhea Musculoskeletal: Patient denies muscle cramps or weakness Neurologic: Patient denies convulsions or seizures Psychiatric: Patient denies memory problems Allergic/Immunologic: Patient denies  recent allergic reaction(s) Hematologic/Lymphatic: Patient denies bleeding tendencies Endocrine: Patient denies heat/cold intolerance  GU: As per HPI.  OBJECTIVE Vitals:   04/13/23 1533  BP: 127/74  Pulse: 76  Temp: 97.7 F (36.5 C)   There is no height or weight on file to calculate BMI.  Physical Examination Constitutional: No obvious distress; patient is non-toxic appearing  Cardiovascular: No visible lower extremity edema.  Respiratory: The patient does not have audible wheezing/stridor; respirations do not appear labored  Gastrointestinal: Abdomen non-distended Musculoskeletal: Normal ROM of UEs  Skin: No obvious rashes/open sores  Neurologic: CN 2-12 grossly intact Psychiatric: Answered questions appropriately with normal affect  Hematologic/Lymphatic/Immunologic: No obvious bruises or sites of spontaneous bleeding  UA: 6-10 WBC/hpf, >30 RBC/hpf, bacteria (many) PVR: 0 ml  ASSESSMENT Urge incontinence - Plan: Urinalysis, Routine w reflex microscopic, BLADDER SCAN AMB NON-IMAGING, mirabegron ER (MYRBETRIQ) 25 MG TB24 tablet, Urine Culture  Urinary urgency - Plan: Urinalysis, Routine w reflex microscopic, BLADDER SCAN AMB NON-IMAGING, mirabegron ER (MYRBETRIQ) 25 MG TB24 tablet, Urine Culture  History of stress incontinence  Presence of urogenital implant  History of kidney stones  Dysuria - Plan: nitrofurantoin, macrocrystal-monohydrate, (MACROBID) 100 MG capsule  Abnormal urinalysis - Plan: nitrofurantoin, macrocrystal-monohydrate, (MACROBID) 100 MG capsule  Recurrent UTI - Plan: nitrofurantoin, macrocrystal-monohydrate, (MACROBID) 100 MG capsule  Abnormal UA. Will check urine culture and treat empirically with Macrobid while awaiting culture results and sensitivities.  We agreed to trial Myrbetriq 25 mg as alternative for OAB symptoms. Gemtesa samples provided today for use while awaiting that prescription. Could increase Myrbetriq to 50 mg daily in the future  if needed based on symptoms. We discussed the small risk for BP elevation with Myrbetriq and she was encouraged to discuss use of that medication with her cardiologist prior to starting.   Will plan to follow up on 08/03/2023 as previously scheduled with KUB and RUS for stone surveillance or sooner if needed. Pt verbalized understanding and agreement. All questions were answered.  PLAN Advised the following: Urine culture.  Macrobid 100 mg 2x/day x7 days. Start Myrbetriq 25 mg daily. 4. Minimize caffeine intake. 5. Return in about 16 weeks (around 08/03/2023) for as previously scheduled.  Orders Placed This Encounter  Procedures   Urine Culture   Urinalysis, Routine w reflex microscopic   BLADDER SCAN AMB NON-IMAGING    It has been explained that the patient is to follow regularly with their PCP in addition to all other providers involved in their care and to follow instructions provided by these respective offices. Patient advised to contact urology clinic if any urologic-pertaining questions, concerns, new symptoms or problems arise in the interim period.  There are no Patient Instructions on file for this visit.  Electronically signed by:  Donnita Falls, MSN, FNP-C, CUNP 04/13/2023 4:18 PM

## 2023-04-11 NOTE — Progress Notes (Unsigned)
Cardiology Office Note:  .   Date:  04/12/2023  ID:  Rachel Odonnell, DOB Jul 07, 1956, MRN [id number] PCP: Sheela Stack  [facility] Providers Cardiologist:  Nona Dell, MD Cardiology APP:  Netta Neat., NP (Inactive) {  History of Present Illness: Rachel Odonnell is a 67 y.o. female with a past medical history of ADD, diabetes mellitus, hypertension, hyperlipidemia, OSA, atrial fibrillation, CAD and chest pain who presents for follow-up.  Patient seen by Dr. Wyline Mood 12/23/2022.  Nuclear stress test 02/2018 was normal.  Echocardiogram at [facility] 7/22 showed LVEF 66 5%, normal RV, mild AI, no significant valve disease.  05/2021 LHC/RHC showed mild to moderate nonobstructive CAD.  LVEDP 11, mean PA 22, PCWP 2, CI 2.3.  PFTs done December 2022 with some restriction likely secondary to body habitus.  Patient's had a history of chronic SOB.  Seen in the ED 7/2 for bradycardia.  Patient presented to PCP office and had low blood pressure, dizziness, lightheadedness, and bradycardia.  IV fluids administered.  MRI of the brain was also ordered.  MRI did not show any acute findings.  Patient was referred to neurology and stable for discharge.  She was seen by me 02/19/2023, she tells me that she was at the urologist office and they did not talk much about her kidney stones.  She felt out of it at the time.  Recently on vacation and forgot her diabetic medications.  Was prescribed Novolin instead of NovoLog.  Kept her sugars down.  Sugar was elevated in the 200s at time of ED visit.  Not likely due to hypoglycemia.  Did have some shortness of breath as well but no chest pain.  Sleepy all the time.  Was given Provigil by her pulmonary/sleep medicine doctor since she was sleeping 14 to 16 hours a day.  She does have sleep apnea but is compliant with her CPAP.  Has a history of atrial fibrillation but is unaware when she is in and out of it.  Heart rate is 54 today, blood pressure 116/50.   She did feel slightly better after IV fluids but still felt out of it.  Has been drinking plenty of water since then.  Today, she states that she continues with lightheadedness, dizziness.  She tries to drink 64 ounces of water a day.  1 coffee in the morning.  No alcohol.  Reviewed her recent echocardiogram and CT scan.  Sent for FFR and thought to possibly have significant blockage in the LAD.  Recommended cardiac catheterization.  We reviewed the risks associated with cardiac catheterization and she and her husband have agreed to proceed.  She is on Coumadin so we will have to wait for washout.  Scheduled for Friday.  Reports no shortness of breath nor dyspnea on exertion. Reports no chest pain, pressure, or tightness. No edema, orthopnea, PND. Reports no palpitations.   ROS: Pertinent ROS in HPI  Studies Reviewed: Marland Kitchen   EKG Interpretation Date/Time:  Monday April 12 2023 11:44:23 EDT Ventricular Rate:  65 PR Interval:  202 QRS Duration:  78 QT Interval:  444 QTC Calculation: 461 R Axis:   -19  Text Interpretation: Normal sinus rhythm with sinus arrhythmia Septal infarct (cited on or before 12-Apr-2023) When compared with ECG of 16-Feb-2023 17:19, Nonspecific T wave abnormality, worse in Inferior leads T wave inversion more evident in Lateral leads Confirmed by [name] [phone]) on 04/12/2023 12:13:49 PM   Cardiac catheterization 06/13/2021 Left Main  Vessel was injected. Vessel is normal in caliber. Vessel is angiographically normal.    Left Anterior Descending  Vessel was injected. Vessel is normal in caliber.  Prox LAD to Mid LAD lesion is 45% stenosed.    Left Circumflex    First Obtuse Marginal Branch  1st Mrg lesion is 60% stenosed.    Right Coronary Artery  Prox RCA to Mid RCA lesion is 25% stenosed.    Intervention   No interventions have been documented.   Right Heart  Right Heart Pressures Hemodynamic findings consistent with pulmonary hypertension.   Left  Heart  Left Ventricle The left ventricular size is normal. The left ventricular systolic function is normal. LV end diastolic pressure is normal. The left ventricular ejection fraction is 55-65% by visual estimate. No regional wall motion abnormalities.   Coronary Diagrams  Diagnostic Dominance: Right  Intervention  Risk Assessment/Calculations:    CHA2DS2-VASc Score = 4   This indicates a 4.8% annual risk of stroke. The patient's score is based upon: CHF History: 0 HTN History: 1 Diabetes History: 0 Stroke History: 0 Vascular Disease History: 1 Age Score: 1 Gender Score: 1            Physical Exam:   VS:  BP (!) 118/50   Pulse 66   Ht 5\' 4"  (1.626 m)   Wt 226 lb 12.8 oz (102.9 kg)   SpO2 97%   BMI 38.93 kg/m    Wt Readings from Last 3 Encounters:  04/12/23 226 lb 12.8 oz (102.9 kg)  03/04/23 226 lb 12.8 oz (102.9 kg)  02/19/23 228 lb 9.6 oz (103.7 kg)    GEN: Well nourished, well developed in no acute distress NECK: No JVD; No carotid bruits CARDIAC: RRR, + systolic murmurs, rubs, gallops RESPIRATORY:  Clear to auscultation without rales, wheezing or rhonchi  ABDOMEN: Soft, non-tender, non-distended EXTREMITIES:  No edema; No deformity   ASSESSMENT AND PLAN: .   1.  Confusion/SOB/Dizziness -Ruled out stroke -Remains on Coumadin for atrial fibrillation -Reviewed her most recent cardiac catheterization which was in 2022 and she had some coronary disease at that time -Reviewed her echocardiogram, CT scan and FFR -We need to get her set up for cardiac cath EKG has been done today and labs were ordered  2.  A-fib, PAF -in and out -on coumadin -CHA2DS2-VASc score of 4, 4.8% annual risk of stroke -Normal sinus rhythm today  3.  OSA -on CPAP -Dr. Vassie Loll  4.  Hyperlipidemia -Continue current medications, Crestor 20 mg daily. -Last LDL was 91, likely will need a medication adjustment at her next appointment  5.  Hypertension -doing better and today was  118/50 -We have asked her to keep track of her blood pressure at home an hour after morning medications and write it down -If blood pressure is getting too low could consider decreasing Imdur   The patient understands that risks include but are not limited to stroke (1 in 1000), death (1 in 1000), kidney failure [usually temporary] (1 in 500), bleeding (1 in 200), allergic reaction [possibly serious] (1 in 200), and agrees to proceed.        Dispo: She will follow-up a week after cardiac catheterization  Signed, Sharlene Dory, PA-C

## 2023-04-11 NOTE — H&P (View-Only) (Signed)
 Cardiology Office Note:  .   Date:  04/12/2023  ID:  Rachel Odonnell, DOB Jul 07, 1956, MRN [id number] PCP: Sheela Stack  [facility] Providers Cardiologist:  Nona Dell, MD Cardiology APP:  Netta Neat., NP (Inactive) {  History of Present Illness: Rachel Odonnell is a 67 y.o. female with a past medical history of ADD, diabetes mellitus, hypertension, hyperlipidemia, OSA, atrial fibrillation, CAD and chest pain who presents for follow-up.  Patient seen by Dr. Wyline Mood 12/23/2022.  Nuclear stress test 02/2018 was normal.  Echocardiogram at [facility] 7/22 showed LVEF 66 5%, normal RV, mild AI, no significant valve disease.  05/2021 LHC/RHC showed mild to moderate nonobstructive CAD.  LVEDP 11, mean PA 22, PCWP 2, CI 2.3.  PFTs done December 2022 with some restriction likely secondary to body habitus.  Patient's had a history of chronic SOB.  Seen in the ED 7/2 for bradycardia.  Patient presented to PCP office and had low blood pressure, dizziness, lightheadedness, and bradycardia.  IV fluids administered.  MRI of the brain was also ordered.  MRI did not show any acute findings.  Patient was referred to neurology and stable for discharge.  She was seen by me 02/19/2023, she tells me that she was at the urologist office and they did not talk much about her kidney stones.  She felt out of it at the time.  Recently on vacation and forgot her diabetic medications.  Was prescribed Novolin instead of NovoLog.  Kept her sugars down.  Sugar was elevated in the 200s at time of ED visit.  Not likely due to hypoglycemia.  Did have some shortness of breath as well but no chest pain.  Sleepy all the time.  Was given Provigil by her pulmonary/sleep medicine doctor since she was sleeping 14 to 16 hours a day.  She does have sleep apnea but is compliant with her CPAP.  Has a history of atrial fibrillation but is unaware when she is in and out of it.  Heart rate is 54 today, blood pressure 116/50.   She did feel slightly better after IV fluids but still felt out of it.  Has been drinking plenty of water since then.  Today, she states that she continues with lightheadedness, dizziness.  She tries to drink 64 ounces of water a day.  1 coffee in the morning.  No alcohol.  Reviewed her recent echocardiogram and CT scan.  Sent for FFR and thought to possibly have significant blockage in the LAD.  Recommended cardiac catheterization.  We reviewed the risks associated with cardiac catheterization and she and her husband have agreed to proceed.  She is on Coumadin so we will have to wait for washout.  Scheduled for Friday.  Reports no shortness of breath nor dyspnea on exertion. Reports no chest pain, pressure, or tightness. No edema, orthopnea, PND. Reports no palpitations.   ROS: Pertinent ROS in HPI  Studies Reviewed: Marland Kitchen   EKG Interpretation Date/Time:  Monday April 12 2023 11:44:23 EDT Ventricular Rate:  65 PR Interval:  202 QRS Duration:  78 QT Interval:  444 QTC Calculation: 461 R Axis:   -19  Text Interpretation: Normal sinus rhythm with sinus arrhythmia Septal infarct (cited on or before 12-Apr-2023) When compared with ECG of 16-Feb-2023 17:19, Nonspecific T wave abnormality, worse in Inferior leads T wave inversion more evident in Lateral leads Confirmed by [name] [phone]) on 04/12/2023 12:13:49 PM   Cardiac catheterization 06/13/2021 Left Main  Vessel was injected. Vessel is normal in caliber. Vessel is angiographically normal.    Left Anterior Descending  Vessel was injected. Vessel is normal in caliber.  Prox LAD to Mid LAD lesion is 45% stenosed.    Left Circumflex    First Obtuse Marginal Branch  1st Mrg lesion is 60% stenosed.    Right Coronary Artery  Prox RCA to Mid RCA lesion is 25% stenosed.    Intervention   No interventions have been documented.   Right Heart  Right Heart Pressures Hemodynamic findings consistent with pulmonary hypertension.   Left  Heart  Left Ventricle The left ventricular size is normal. The left ventricular systolic function is normal. LV end diastolic pressure is normal. The left ventricular ejection fraction is 55-65% by visual estimate. No regional wall motion abnormalities.   Coronary Diagrams  Diagnostic Dominance: Right  Intervention  Risk Assessment/Calculations:    CHA2DS2-VASc Score = 4   This indicates a 4.8% annual risk of stroke. The patient's score is based upon: CHF History: 0 HTN History: 1 Diabetes History: 0 Stroke History: 0 Vascular Disease History: 1 Age Score: 1 Gender Score: 1            Physical Exam:   VS:  BP (!) 118/50   Pulse 66   Ht 5\' 4"  (1.626 m)   Wt 226 lb 12.8 oz (102.9 kg)   SpO2 97%   BMI 38.93 kg/m    Wt Readings from Last 3 Encounters:  04/12/23 226 lb 12.8 oz (102.9 kg)  03/04/23 226 lb 12.8 oz (102.9 kg)  02/19/23 228 lb 9.6 oz (103.7 kg)    GEN: Well nourished, well developed in no acute distress NECK: No JVD; No carotid bruits CARDIAC: RRR, + systolic murmurs, rubs, gallops RESPIRATORY:  Clear to auscultation without rales, wheezing or rhonchi  ABDOMEN: Soft, non-tender, non-distended EXTREMITIES:  No edema; No deformity   ASSESSMENT AND PLAN: .   1.  Confusion/SOB/Dizziness -Ruled out stroke -Remains on Coumadin for atrial fibrillation -Reviewed her most recent cardiac catheterization which was in 2022 and she had some coronary disease at that time -Reviewed her echocardiogram, CT scan and FFR -We need to get her set up for cardiac cath EKG has been done today and labs were ordered  2.  A-fib, PAF -in and out -on coumadin -CHA2DS2-VASc score of 4, 4.8% annual risk of stroke -Normal sinus rhythm today  3.  OSA -on CPAP -Dr. Vassie Loll  4.  Hyperlipidemia -Continue current medications, Crestor 20 mg daily. -Last LDL was 91, likely will need a medication adjustment at her next appointment  5.  Hypertension -doing better and today was  118/50 -We have asked her to keep track of her blood pressure at home an hour after morning medications and write it down -If blood pressure is getting too low could consider decreasing Imdur   The patient understands that risks include but are not limited to stroke (1 in 1000), death (1 in 1000), kidney failure [usually temporary] (1 in 500), bleeding (1 in 200), allergic reaction [possibly serious] (1 in 200), and agrees to proceed.        Dispo: She will follow-up a week after cardiac catheterization  Signed, Sharlene Dory, PA-C

## 2023-04-12 ENCOUNTER — Ambulatory Visit: Payer: Medicare HMO | Attending: Physician Assistant | Admitting: Physician Assistant

## 2023-04-12 ENCOUNTER — Encounter: Payer: Self-pay | Admitting: Physician Assistant

## 2023-04-12 VITALS — BP 118/50 | HR 66 | Ht 64.0 in | Wt 226.8 lb

## 2023-04-12 DIAGNOSIS — I2510 Atherosclerotic heart disease of native coronary artery without angina pectoris: Secondary | ICD-10-CM

## 2023-04-12 DIAGNOSIS — Z5181 Encounter for therapeutic drug level monitoring: Secondary | ICD-10-CM

## 2023-04-12 DIAGNOSIS — I4821 Permanent atrial fibrillation: Secondary | ICD-10-CM

## 2023-04-12 MED ORDER — NITROGLYCERIN 0.4 MG SL SUBL: 0.4000 mg | SUBLINGUAL_TABLET | SUBLINGUAL | 3 refills | Status: DC | PRN

## 2023-04-12 NOTE — Patient Instructions (Signed)
Medication Instructions:  A refill of sublingual nitroglycerin was sent in for you today to have on hand to take as needed for chest pain. *If you need a refill on your cardiac medications before your next appointment, please call your pharmacy*  Lab Work: BMET, CBC-TODAY If you have labs (blood work) drawn today and your tests are completely normal, you will receive your results only by: MyChart Message (if you have MyChart) OR A paper copy in the mail If you have any lab test that is abnormal or we need to change your treatment, we will call you to review the results.   Testing/Procedures:       Cardiac/Peripheral Catheterization   You are scheduled for a Cardiac Catheterization on Friday, August 30 with Dr. Peter Swaziland.  1. Please arrive at the [facility] (Main Entrance A) at [facility]: [address] [place], [place] [address] at 7:00 AM (This time is 2 hour(s) before your procedure to ensure your preparation). Free valet parking service is available. You will check in at ADMITTING. The support person will be asked to wait in the waiting room.  It is OK to have someone drop you off and come back when you are ready to be discharged.        Special note: Every effort is made to have your procedure done on time. Please understand that emergencies sometimes delay scheduled procedures.  2. Diet: Do not eat solid foods after midnight.  You may have clear liquids until 5 AM the day of the procedure.  3. Labs: TODAY  4. Medication instructions in preparation for your procedure:   Contrast Allergy: No  Stop taking Coumadin (Warfarin) for 5 days prior  Take only 1/2 dose of insulin the night before your procedure. Do not take any insulin on the day of the procedure.  Do not take oral diabetic meds on the day of the procedure and HOLD 48 HOURS AFTER THE PROCEDURE.  On the morning of your procedure, take Aspirin 81 mg and any morning medicines NOT listed above.  You may  use sips of water.  5. Plan to go home the same day, you will only stay overnight if medically necessary. 6. You MUST have a responsible adult to drive you home. 7. An adult MUST be with you the first 24 hours after you arrive home. 8. Bring a current list of your medications, and the last time and date medication taken. 9. Bring ID and current insurance cards. 10.Please wear clothes that are easy to get on and off and wear slip-on shoes.  Thank you for allowing [place] to care for you!   -- [facility]    Follow-Up: At [facility], you and your health needs are our priority.  As part of our continuing mission to provide you with exceptional heart care, we have created designated Provider Care Teams.  These Care Teams include your primary Cardiologist (physician) and Advanced Practice Providers (APPs -  Physician Assistants and Nurse Practitioners) who all work together to provide you with the care you need, when you need it.  We recommend signing up for the patient portal called "MyChart".  Sign up information is provided on this After Visit Summary.  MyChart is used to connect with patients for Virtual Visits (Telemedicine).  Patients are able to view lab/test results, encounter notes, upcoming appointments, etc.  Non-urgent messages can be sent to your provider as well.   To learn more about what  you can do with MyChart, go to [email or web address].    Your next appointment:   1 week(s)  Provider:   Nona Dell, MD  or APP

## 2023-04-13 ENCOUNTER — Ambulatory Visit (INDEPENDENT_AMBULATORY_CARE_PROVIDER_SITE_OTHER): Payer: Medicare HMO | Admitting: Urology

## 2023-04-13 ENCOUNTER — Telehealth: Payer: Self-pay | Admitting: Cardiology

## 2023-04-13 ENCOUNTER — Encounter: Payer: Self-pay | Admitting: Urology

## 2023-04-13 VITALS — BP 127/74 | HR 76 | Temp 97.7°F

## 2023-04-13 DIAGNOSIS — R3915 Urgency of urination: Secondary | ICD-10-CM

## 2023-04-13 DIAGNOSIS — R8290 Unspecified abnormal findings in urine: Secondary | ICD-10-CM | POA: Diagnosis not present

## 2023-04-13 DIAGNOSIS — R300 Dysuria: Secondary | ICD-10-CM | POA: Diagnosis not present

## 2023-04-13 DIAGNOSIS — Z87448 Personal history of other diseases of urinary system: Secondary | ICD-10-CM

## 2023-04-13 DIAGNOSIS — N3941 Urge incontinence: Secondary | ICD-10-CM

## 2023-04-13 DIAGNOSIS — Z87440 Personal history of urinary (tract) infections: Secondary | ICD-10-CM | POA: Diagnosis not present

## 2023-04-13 DIAGNOSIS — Z87442 Personal history of urinary calculi: Secondary | ICD-10-CM

## 2023-04-13 DIAGNOSIS — Z960 Presence of urogenital implants: Secondary | ICD-10-CM | POA: Diagnosis not present

## 2023-04-13 DIAGNOSIS — N390 Urinary tract infection, site not specified: Secondary | ICD-10-CM

## 2023-04-13 LAB — BASIC METABOLIC PANEL
BUN/Creatinine Ratio: 15 (ref 12–28)
BUN: 15 mg/dL (ref 8–27)
CO2: 25 mmol/L (ref 20–29)
Calcium: 10.4 mg/dL — ABNORMAL HIGH (ref 8.7–10.3)
Chloride: 94 mmol/L — ABNORMAL LOW (ref 96–106)
Creatinine, Ser: 0.97 mg/dL (ref 0.57–1.00)
Glucose: 279 mg/dL — ABNORMAL HIGH (ref 70–99)
Potassium: 4 mmol/L (ref 3.5–5.2)
Sodium: 136 mmol/L (ref 134–144)
eGFR: 64 mL/min/{1.73_m2} (ref >59)

## 2023-04-13 LAB — CBC
Hematocrit: 42.3 % (ref 34.0–46.6)
Hemoglobin: 13.8 g/dL (ref 11.1–15.9)
MCH: 31.3 pg (ref 26.6–33.0)
MCHC: 32.6 g/dL (ref 31.5–35.7)
MCV: 96 fL (ref 79–97)
Platelets: 260 10*3/uL (ref 150–450)
RBC: 4.41 x10E6/uL (ref 3.77–5.28)
RDW: 12.3 % (ref 11.7–15.4)
WBC: 7.9 10*3/uL (ref 3.4–10.8)

## 2023-04-13 LAB — BLADDER SCAN AMB NON-IMAGING: Scan Result: 0

## 2023-04-13 MED ORDER — MIRABEGRON ER 25 MG PO TB24
25.0000 mg | ORAL_TABLET | Freq: Every day | ORAL | 11 refills | Status: DC
Start: 2023-04-13 — End: 2023-05-05

## 2023-04-13 MED ORDER — NITROFURANTOIN MONOHYD MACRO 100 MG PO CAPS
100.0000 mg | ORAL_CAPSULE | Freq: Two times a day (BID) | ORAL | 0 refills | Status: AC
Start: 2023-04-13 — End: 2023-04-20

## 2023-04-13 NOTE — Telephone Encounter (Signed)
Spoke with patient  regarding upcoming appointment for Coumadin. She is scheduled for Heart Cath on Friday needing to reschedule her appointment on Thursday. Sent to [facility] for new appointment.

## 2023-04-13 NOTE — Telephone Encounter (Signed)
Patient is calling to get information on upcoming procedure and upcoming appt for coumadin check. Please advise.

## 2023-04-13 NOTE — Progress Notes (Signed)
post void residual=0 ?

## 2023-04-14 LAB — URINALYSIS, ROUTINE W REFLEX MICROSCOPIC
Bilirubin, UA: NEGATIVE
Ketones, UA: NEGATIVE
Nitrite, UA: NEGATIVE
Specific Gravity, UA: 1.025 (ref 1.005–1.030)
Urobilinogen, Ur: 4 mg/dL — ABNORMAL HIGH (ref 0.2–1.0)
pH, UA: 6 (ref 5.0–7.5)

## 2023-04-14 LAB — MICROSCOPIC EXAMINATION: RBC, Urine: >30 /HPF — AB (ref 0–2)

## 2023-04-15 ENCOUNTER — Telehealth: Payer: Self-pay | Admitting: *Deleted

## 2023-04-15 LAB — URINE CULTURE

## 2023-04-15 NOTE — Progress Notes (Signed)
Please let pt know urine culture result - the Macrobid which was prescribed should cover UTI based on culture sensitivities. She is scheduled for cardiac cath procedure tomorrow and needs to notify surgeon (Dr. Peter Swaziland) preop of this finding. I will try to message him also.

## 2023-04-15 NOTE — Telephone Encounter (Addendum)
Cardiac Catheterization scheduled at [facility] for: Friday April 16, 2023 9 AM Arrival time [facility] Main Entrance A at: 7 AM  Nothing to eat after midnight prior to procedure, clear liquids until 5 AM day of procedure.  Medication instructions: -Hold:  Warfarin-pt reports none since 04/11/23 until post procedure  Metformin-day of procedure and 48 hours post procedure  Glipizide-AM of procedure  Insulin-AM of procedure/1/2 usual Insulin dose HS prior to procedure -Other usual morning medications can be taken with sips of water including aspirin 81 mg.  Plan to go home the same day, you will only stay overnight if medically necessary.  You must have responsible adult to drive you home.  Someone must be with you the first 24 hours after you arrive home.  Reviewed procedure instructions with patient.  I have confirmed with [name], PA, that patient should be scheduled for a Right and Left Heart Cath.

## 2023-04-16 ENCOUNTER — Telehealth: Payer: Self-pay

## 2023-04-16 ENCOUNTER — Ambulatory Visit (HOSPITAL_COMMUNITY)
Admission: RE | Admit: 2023-04-16 | Discharge: 2023-04-17 | Disposition: A | Discharge status: Home / Self Care | Payer: Medicare HMO | Attending: Cardiology | Admitting: Cardiology

## 2023-04-16 ENCOUNTER — Other Ambulatory Visit: Payer: Self-pay

## 2023-04-16 ENCOUNTER — Encounter (HOSPITAL_COMMUNITY): Payer: Self-pay | Admitting: Cardiology

## 2023-04-16 ENCOUNTER — Encounter (HOSPITAL_COMMUNITY)
Admission: RE | Disposition: A | Discharge status: Home / Self Care | Payer: Medicare HMO | Source: Home / Self Care | Attending: Cardiology

## 2023-04-16 DIAGNOSIS — Z7901 Long term (current) use of anticoagulants: Secondary | ICD-10-CM | POA: Insufficient documentation

## 2023-04-16 DIAGNOSIS — Z01812 Encounter for preprocedural laboratory examination: Secondary | ICD-10-CM

## 2023-04-16 DIAGNOSIS — I480 Paroxysmal atrial fibrillation: Secondary | ICD-10-CM | POA: Diagnosis not present

## 2023-04-16 DIAGNOSIS — I10 Essential (primary) hypertension: Secondary | ICD-10-CM | POA: Diagnosis not present

## 2023-04-16 DIAGNOSIS — Z794 Long term (current) use of insulin: Secondary | ICD-10-CM | POA: Diagnosis not present

## 2023-04-16 DIAGNOSIS — E782 Mixed hyperlipidemia: Secondary | ICD-10-CM | POA: Diagnosis not present

## 2023-04-16 DIAGNOSIS — I272 Pulmonary hypertension, unspecified: Secondary | ICD-10-CM | POA: Insufficient documentation

## 2023-04-16 DIAGNOSIS — I25119 Atherosclerotic heart disease of native coronary artery with unspecified angina pectoris: Secondary | ICD-10-CM | POA: Diagnosis not present

## 2023-04-16 DIAGNOSIS — Z955 Presence of coronary angioplasty implant and graft: Secondary | ICD-10-CM | POA: Insufficient documentation

## 2023-04-16 DIAGNOSIS — Z9861 Coronary angioplasty status: Secondary | ICD-10-CM

## 2023-04-16 DIAGNOSIS — I4891 Unspecified atrial fibrillation: Secondary | ICD-10-CM | POA: Diagnosis present

## 2023-04-16 DIAGNOSIS — Z79899 Other long term (current) drug therapy: Secondary | ICD-10-CM | POA: Diagnosis not present

## 2023-04-16 DIAGNOSIS — I209 Angina pectoris, unspecified: Secondary | ICD-10-CM | POA: Diagnosis present

## 2023-04-16 DIAGNOSIS — F988 Other specified behavioral and emotional disorders with onset usually occurring in childhood and adolescence: Secondary | ICD-10-CM | POA: Diagnosis not present

## 2023-04-16 DIAGNOSIS — E1159 Type 2 diabetes mellitus with other circulatory complications: Secondary | ICD-10-CM | POA: Diagnosis not present

## 2023-04-16 DIAGNOSIS — I2510 Atherosclerotic heart disease of native coronary artery without angina pectoris: Secondary | ICD-10-CM | POA: Insufficient documentation

## 2023-04-16 DIAGNOSIS — G4733 Obstructive sleep apnea (adult) (pediatric): Secondary | ICD-10-CM | POA: Diagnosis not present

## 2023-04-16 HISTORY — PX: CORONARY ULTRASOUND/IVUS: CATH118244

## 2023-04-16 HISTORY — PX: CORONARY PRESSURE/FFR STUDY: CATH118243

## 2023-04-16 HISTORY — PX: RIGHT/LEFT HEART CATH AND CORONARY ANGIOGRAPHY: CATH118266

## 2023-04-16 HISTORY — PX: CORONARY STENT INTERVENTION: CATH118234

## 2023-04-16 LAB — POCT I-STAT 7, (LYTES, BLD GAS, ICA,H+H)
Acid-base deficit: 1 mmol/L (ref 0.0–2.0)
Bicarbonate: 24.6 mmol/L (ref 20.0–28.0)
Calcium, Ion: 1.28 mmol/L (ref 1.15–1.40)
HCT: 35 % — ABNORMAL LOW (ref 36.0–46.0)
Hemoglobin: 11.9 g/dL — ABNORMAL LOW (ref 12.0–15.0)
O2 Saturation: 97 %
Potassium: 4.1 mmol/L (ref 3.5–5.1)
Sodium: 136 mmol/L (ref 135–145)
TCO2: 26 mmol/L (ref 22–32)
pCO2 arterial: 41.9 mmHg (ref 32–48)
pH, Arterial: 7.375 (ref 7.35–7.45)
pO2, Arterial: 90 mmHg (ref 83–108)

## 2023-04-16 LAB — POCT I-STAT EG7
Acid-Base Excess: 0 mmol/L (ref 0.0–2.0)
Acid-Base Excess: 0 mmol/L (ref 0.0–2.0)
Bicarbonate: 27.1 mmol/L (ref 20.0–28.0)
Bicarbonate: 27.2 mmol/L (ref 20.0–28.0)
Calcium, Ion: 1.32 mmol/L (ref 1.15–1.40)
Calcium, Ion: 1.34 mmol/L (ref 1.15–1.40)
HCT: 36 % (ref 36.0–46.0)
HCT: 36 % (ref 36.0–46.0)
Hemoglobin: 12.2 g/dL (ref 12.0–15.0)
Hemoglobin: 12.2 g/dL (ref 12.0–15.0)
O2 Saturation: 62 %
O2 Saturation: 63 %
Potassium: 4.2 mmol/L (ref 3.5–5.1)
Potassium: 4.2 mmol/L (ref 3.5–5.1)
Sodium: 136 mmol/L (ref 135–145)
Sodium: 137 mmol/L (ref 135–145)
TCO2: 29 mmol/L (ref 22–32)
TCO2: 29 mmol/L (ref 22–32)
pCO2, Ven: 52.5 mmHg (ref 44–60)
pCO2, Ven: 53.1 mmHg (ref 44–60)
pH, Ven: 7.315 (ref 7.25–7.43)
pH, Ven: 7.322 (ref 7.25–7.43)
pO2, Ven: 36 mmHg (ref 32–45)
pO2, Ven: 36 mmHg (ref 32–45)

## 2023-04-16 LAB — GLUCOSE, CAPILLARY
Glucose-Capillary: 287 mg/dL — ABNORMAL HIGH (ref 70–99)
Glucose-Capillary: 278 mg/dL — ABNORMAL HIGH (ref 70–99)
Glucose-Capillary: 388 mg/dL — ABNORMAL HIGH (ref 70–99)
Glucose-Capillary: 297 mg/dL — ABNORMAL HIGH (ref 70–99)
Glucose-Capillary: 286 mg/dL — ABNORMAL HIGH (ref 70–99)

## 2023-04-16 LAB — CBC
HCT: 37 % (ref 36.0–46.0)
Hemoglobin: 12.2 g/dL (ref 12.0–15.0)
MCH: 31.8 pg (ref 26.0–34.0)
MCHC: 33 g/dL (ref 30.0–36.0)
MCV: 96.4 fL (ref 80.0–100.0)
Platelets: 254 10*3/uL (ref 150–400)
RBC: 3.84 MIL/uL — ABNORMAL LOW (ref 3.87–5.11)
RDW: 12.9 % (ref 11.5–15.5)
WBC: 6.4 10*3/uL (ref 4.0–10.5)
nRBC: 0 % (ref 0.0–0.2)

## 2023-04-16 LAB — PROTIME-INR
INR: 1.2 (ref 0.8–1.2)
Prothrombin Time: 15.3 seconds — ABNORMAL HIGH (ref 11.4–15.2)

## 2023-04-16 LAB — CREATININE, SERUM
Creatinine, Ser: 1.1 mg/dL — ABNORMAL HIGH (ref 0.44–1.00)
GFR, Estimated: 55 mL/min — ABNORMAL LOW (ref >60)

## 2023-04-16 LAB — POCT ACTIVATED CLOTTING TIME
Activated Clotting Time: 324 seconds
Activated Clotting Time: 470 seconds
Activated Clotting Time: 232 seconds

## 2023-04-16 SURGERY — RIGHT/LEFT HEART CATH AND CORONARY ANGIOGRAPHY
Anesthesia: LOCAL

## 2023-04-16 MED ORDER — SODIUM CHLORIDE 0.9 % WEIGHT BASED INFUSION: 1.0000 mL/kg/h | INTRAVENOUS | Status: DC

## 2023-04-16 MED ORDER — INSULIN DEGLUDEC 200 UNIT/ML ~~LOC~~ SOPN: PEN_INJECTOR | Freq: Every day | SUBCUTANEOUS | Status: DC

## 2023-04-16 MED ORDER — SODIUM CHLORIDE 0.9% FLUSH: 3.0000 mL | INTRAVENOUS | Status: DC | PRN

## 2023-04-16 MED ORDER — HEPARIN SODIUM (PORCINE) 1000 UNIT/ML IJ SOLN
INTRAMUSCULAR | Status: AC
  Filled 2023-04-16: qty 10

## 2023-04-16 MED ORDER — ASPIRIN 81 MG PO CHEW
81.0000 mg | CHEWABLE_TABLET | Freq: Every day | ORAL | Status: DC
  Administered 2023-04-17: 81 mg via ORAL
  Filled 2023-04-16: qty 1

## 2023-04-16 MED ORDER — HEPARIN (PORCINE) IN NACL 1000-0.9 UT/500ML-% IV SOLN
INTRAVENOUS | Status: DC | PRN
  Administered 2023-04-16 (×2): 500 mL

## 2023-04-16 MED ORDER — MODAFINIL 100 MG PO TABS
100.0000 mg | ORAL_TABLET | Freq: Every day | ORAL | Status: DC
  Administered 2023-04-17: 100 mg via ORAL
  Filled 2023-04-16: qty 1

## 2023-04-16 MED ORDER — BUSPIRONE HCL 5 MG PO TABS
5.0000 mg | ORAL_TABLET | Freq: Every day | ORAL | Status: DC
  Administered 2023-04-16: 5 mg via ORAL
  Filled 2023-04-16: qty 1

## 2023-04-16 MED ORDER — NITROFURANTOIN MONOHYD MACRO 100 MG PO CAPS
100.0000 mg | ORAL_CAPSULE | Freq: Two times a day (BID) | ORAL | Status: DC
  Administered 2023-04-16 – 2023-04-17 (×3): 100 mg via ORAL
  Filled 2023-04-16 (×4): qty 1

## 2023-04-16 MED ORDER — ENOXAPARIN SODIUM 40 MG/0.4ML IJ SOSY
40.0000 mg | PREFILLED_SYRINGE | INTRAMUSCULAR | Status: DC
  Administered 2023-04-17: 40 mg via SUBCUTANEOUS
  Filled 2023-04-16: qty 0.4

## 2023-04-16 MED ORDER — FAMOTIDINE IN NACL 20-0.9 MG/50ML-% IV SOLN
INTRAVENOUS | Status: AC | PRN
  Administered 2023-04-16: 20 mg via INTRAVENOUS

## 2023-04-16 MED ORDER — NITROGLYCERIN 1 MG/10 ML FOR IR/CATH LAB
INTRA_ARTERIAL | Status: DC | PRN
  Administered 2023-04-16 (×3): 200 ug via INTRACORONARY

## 2023-04-16 MED ORDER — LORATADINE 10 MG PO TABS
5.0000 mg | ORAL_TABLET | Freq: Every evening | ORAL | Status: DC
  Administered 2023-04-16: 5 mg via ORAL
  Filled 2023-04-16: qty 1

## 2023-04-16 MED ORDER — LABETALOL HCL 5 MG/ML IV SOLN: 10.0000 mg | INTRAVENOUS | Status: AC | PRN

## 2023-04-16 MED ORDER — INSULIN GLARGINE-YFGN 100 UNIT/ML ~~LOC~~ SOLN
100.0000 [IU] | Freq: Every day | SUBCUTANEOUS | Status: DC
  Administered 2023-04-16: 100 [IU] via SUBCUTANEOUS
  Filled 2023-04-16 (×2): qty 1

## 2023-04-16 MED ORDER — MONTELUKAST SODIUM 10 MG PO TABS
10.0000 mg | ORAL_TABLET | Freq: Every day | ORAL | Status: DC
  Administered 2023-04-16: 10 mg via ORAL
  Filled 2023-04-16: qty 1

## 2023-04-16 MED ORDER — ACETAMINOPHEN 325 MG PO TABS: 650.0000 mg | ORAL_TABLET | ORAL | Status: DC | PRN

## 2023-04-16 MED ORDER — DONEPEZIL HCL 5 MG PO TABS
5.0000 mg | ORAL_TABLET | Freq: Every evening | ORAL | Status: DC
  Administered 2023-04-16: 5 mg via ORAL
  Filled 2023-04-16: qty 1

## 2023-04-16 MED ORDER — LIDOCAINE HCL (PF) 1 % IJ SOLN
INTRAMUSCULAR | Status: DC | PRN
  Administered 2023-04-16 (×2): 2 mL

## 2023-04-16 MED ORDER — VERAPAMIL HCL 2.5 MG/ML IV SOLN
INTRAVENOUS | Status: DC | PRN
  Administered 2023-04-16: 10 mL via INTRA_ARTERIAL

## 2023-04-16 MED ORDER — FENTANYL CITRATE (PF) 100 MCG/2ML IJ SOLN
INTRAMUSCULAR | Status: DC | PRN
  Administered 2023-04-16 (×3): 25 ug via INTRAVENOUS

## 2023-04-16 MED ORDER — INSULIN ASPART 100 UNIT/ML IJ SOLN
0.0000 [IU] | Freq: Three times a day (TID) | INTRAMUSCULAR | Status: DC
  Administered 2023-04-16: 15 [IU] via SUBCUTANEOUS
  Administered 2023-04-17: 8 [IU] via SUBCUTANEOUS
  Administered 2023-04-17: 11 [IU] via SUBCUTANEOUS

## 2023-04-16 MED ORDER — FAMOTIDINE IN NACL 20-0.9 MG/50ML-% IV SOLN
INTRAVENOUS | Status: AC
  Filled 2023-04-16: qty 50

## 2023-04-16 MED ORDER — ROSUVASTATIN CALCIUM 20 MG PO TABS
40.0000 mg | ORAL_TABLET | Freq: Every day | ORAL | Status: DC
  Administered 2023-04-16 – 2023-04-17 (×2): 40 mg via ORAL
  Filled 2023-04-16 (×2): qty 2

## 2023-04-16 MED ORDER — CLOPIDOGREL BISULFATE 300 MG PO TABS
ORAL_TABLET | ORAL | Status: AC
  Filled 2023-04-16: qty 2

## 2023-04-16 MED ORDER — CITALOPRAM HYDROBROMIDE 20 MG PO TABS
40.0000 mg | ORAL_TABLET | Freq: Every evening | ORAL | Status: DC
  Administered 2023-04-16: 40 mg via ORAL
  Filled 2023-04-16: qty 2

## 2023-04-16 MED ORDER — SODIUM CHLORIDE 0.9 % WEIGHT BASED INFUSION
1.0000 mL/kg/h | INTRAVENOUS | Status: AC
  Administered 2023-04-16: 1 mL/kg/h via INTRAVENOUS

## 2023-04-16 MED ORDER — FENOFIBRATE 160 MG PO TABS
160.0000 mg | ORAL_TABLET | Freq: Every day | ORAL | Status: DC
  Administered 2023-04-16 – 2023-04-17 (×2): 160 mg via ORAL
  Filled 2023-04-16 (×2): qty 1

## 2023-04-16 MED ORDER — NITROGLYCERIN 1 MG/10 ML FOR IR/CATH LAB
INTRA_ARTERIAL | Status: AC
  Filled 2023-04-16: qty 10

## 2023-04-16 MED ORDER — MIDAZOLAM HCL 2 MG/2ML IJ SOLN
INTRAMUSCULAR | Status: DC | PRN
  Administered 2023-04-16 (×2): 1 mg via INTRAVENOUS

## 2023-04-16 MED ORDER — ONDANSETRON HCL 4 MG/2ML IJ SOLN: 4.0000 mg | Freq: Four times a day (QID) | INTRAMUSCULAR | Status: DC | PRN

## 2023-04-16 MED ORDER — GLIPIZIDE 5 MG PO TABS
5.0000 mg | ORAL_TABLET | Freq: Every day | ORAL | Status: DC
  Administered 2023-04-17: 5 mg via ORAL
  Filled 2023-04-16: qty 1

## 2023-04-16 MED ORDER — SODIUM CHLORIDE 0.9 % WEIGHT BASED INFUSION
3.0000 mL/kg/h | INTRAVENOUS | Status: DC
  Administered 2023-04-16: 3 mL/kg/h via INTRAVENOUS

## 2023-04-16 MED ORDER — GABAPENTIN 100 MG PO CAPS
100.0000 mg | ORAL_CAPSULE | Freq: Every day | ORAL | Status: DC
  Administered 2023-04-16: 100 mg via ORAL
  Filled 2023-04-16: qty 1

## 2023-04-16 MED ORDER — HYDRALAZINE HCL 20 MG/ML IJ SOLN: 10.0000 mg | INTRAMUSCULAR | Status: AC | PRN

## 2023-04-16 MED ORDER — SODIUM CHLORIDE 0.9 % IV SOLN: 250.0000 mL | INTRAVENOUS | Status: DC | PRN

## 2023-04-16 MED ORDER — LIDOCAINE HCL (PF) 1 % IJ SOLN
INTRAMUSCULAR | Status: AC
  Filled 2023-04-16: qty 30

## 2023-04-16 MED ORDER — MIRABEGRON ER 25 MG PO TB24
25.0000 mg | ORAL_TABLET | Freq: Every day | ORAL | Status: DC
  Administered 2023-04-16 – 2023-04-17 (×2): 25 mg via ORAL
  Filled 2023-04-16 (×2): qty 1

## 2023-04-16 MED ORDER — ISOSORBIDE MONONITRATE ER 60 MG PO TB24
60.0000 mg | ORAL_TABLET | Freq: Every day | ORAL | Status: DC
  Administered 2023-04-17: 60 mg via ORAL
  Filled 2023-04-16: qty 1

## 2023-04-16 MED ORDER — ASPIRIN 81 MG PO CHEW: 81.0000 mg | CHEWABLE_TABLET | ORAL | Status: DC

## 2023-04-16 MED ORDER — CLOPIDOGREL BISULFATE 300 MG PO TABS
ORAL_TABLET | ORAL | Status: DC | PRN
  Administered 2023-04-16: 600 mg via ORAL

## 2023-04-16 MED ORDER — MIDAZOLAM HCL 2 MG/2ML IJ SOLN
INTRAMUSCULAR | Status: AC
  Filled 2023-04-16: qty 2

## 2023-04-16 MED ORDER — CLOPIDOGREL BISULFATE 75 MG PO TABS
75.0000 mg | ORAL_TABLET | Freq: Every day | ORAL | Status: DC
  Administered 2023-04-17: 75 mg via ORAL
  Filled 2023-04-16: qty 1

## 2023-04-16 MED ORDER — NITROGLYCERIN 0.4 MG SL SUBL: 0.4000 mg | SUBLINGUAL_TABLET | SUBLINGUAL | Status: DC | PRN

## 2023-04-16 MED ORDER — IOHEXOL 350 MG/ML SOLN
INTRAVENOUS | Status: DC | PRN
  Administered 2023-04-16: 200 mL

## 2023-04-16 MED ORDER — FENTANYL CITRATE (PF) 100 MCG/2ML IJ SOLN
INTRAMUSCULAR | Status: AC
  Filled 2023-04-16: qty 2

## 2023-04-16 MED ORDER — VERAPAMIL HCL 2.5 MG/ML IV SOLN
INTRAVENOUS | Status: AC
  Filled 2023-04-16: qty 2

## 2023-04-16 MED ORDER — HEPARIN SODIUM (PORCINE) 1000 UNIT/ML IJ SOLN
INTRAMUSCULAR | Status: DC | PRN
  Administered 2023-04-16: 5000 [IU] via INTRA_ARTERIAL
  Administered 2023-04-16: 2000 [IU] via INTRAVENOUS
  Administered 2023-04-16: 5000 [IU] via INTRA_ARTERIAL

## 2023-04-16 MED ORDER — ESTRADIOL 0.5 MG PO TABS
1.0000 mg | ORAL_TABLET | Freq: Every evening | ORAL | Status: DC
  Administered 2023-04-16: 1 mg via ORAL
  Filled 2023-04-16 (×2): qty 2

## 2023-04-16 MED ORDER — INDAPAMIDE 1.25 MG PO TABS
2.5000 mg | ORAL_TABLET | Freq: Every day | ORAL | Status: DC
  Administered 2023-04-17: 2.5 mg via ORAL
  Filled 2023-04-16: qty 1
  Filled 2023-04-16: qty 2

## 2023-04-16 MED ORDER — LEVOTHYROXINE SODIUM 50 MCG PO TABS
50.0000 ug | ORAL_TABLET | Freq: Every day | ORAL | Status: DC
  Administered 2023-04-17: 50 ug via ORAL
  Filled 2023-04-16: qty 1

## 2023-04-16 MED ORDER — SODIUM CHLORIDE 0.9% FLUSH
3.0000 mL | Freq: Two times a day (BID) | INTRAVENOUS | Status: DC
  Administered 2023-04-17: 3 mL via INTRAVENOUS

## 2023-04-16 SURGICAL SUPPLY — 27 items
BALLN EMERGE MR 2.5X20 (BALLOONS) ×1
BALLN SAPPHIRE 2.0X12 (BALLOONS) ×1
BALLN ~~LOC~~ EMERGE MR 3.5X20 (BALLOONS) ×1
BALLOON EMERGE MR 2.5X20 (BALLOONS) IMPLANT
BALLOON SAPPHIRE 2.0X12 (BALLOONS) IMPLANT
BALLOON ~~LOC~~ EMERGE MR 3.5X20 (BALLOONS) IMPLANT
CATH 5FR JL3.5 JR4 ANG PIG MP (CATHETERS) IMPLANT
CATH BALLN WEDGE 5F 110CM (CATHETERS) IMPLANT
CATH OPTICROSS HD (CATHETERS) IMPLANT
CATH VISTA GUIDE 6FR XBLAD3.5 (CATHETERS) IMPLANT
DEVICE RAD COMP TR BAND LRG (VASCULAR PRODUCTS) IMPLANT
GLIDESHEATH SLEND SS 6F .021 (SHEATH) IMPLANT
GUIDEWIRE INQWIRE 1.5J.035X260 (WIRE) IMPLANT
GUIDEWIRE PRESSURE X 175 (WIRE) IMPLANT
INQWIRE 1.5J .035X260CM (WIRE) ×1
KIT ENCORE 26 ADVANTAGE (KITS) IMPLANT
KIT ESSENTIALS PG (KITS) IMPLANT
PACK CARDIAC CATHETERIZATION (CUSTOM PROCEDURE TRAY) ×1 IMPLANT
SET ATX-X65L (MISCELLANEOUS) IMPLANT
SHEATH GLIDE SLENDER 4/5FR (SHEATH) IMPLANT
SHEATH PROBE COVER 6X72 (BAG) IMPLANT
SLED PULL BACK IVUS (MISCELLANEOUS) IMPLANT
STENT SYNERGY XD 3.0X38 (Permanent Stent) IMPLANT
SYNERGY XD 3.0X38 (Permanent Stent) ×1 IMPLANT
WIRE ASAHI PROWATER 180CM (WIRE) IMPLANT
WIRE PT2 MS 185 (WIRE) IMPLANT
WIRE RUNTHROUGH .014X180CM (WIRE) IMPLANT

## 2023-04-16 NOTE — Progress Notes (Signed)
Pt arrived to [facility] from cath lab 1. B/P cuff inflated to 20 below SBP on RUE due to hematoma. Cuff on for 10 min currently. Starting to letting air down at 1105 and will re-eval. Q21min will drop mmHg per protocol.

## 2023-04-16 NOTE — Progress Notes (Signed)
Blood pressure cuff removed from right forearm per blood pressure cuff protocol orders for forearm hematoma. Right forearm is firm, but not hard. Radial pulse strong. Dr Swaziland paged. Dr Swaziland called and said to keep right arm elevated, keep manual BP cuff off and continue monitoring.

## 2023-04-16 NOTE — Interval H&P Note (Signed)
History and Physical Interval Note:  04/16/2023 7:17 AM  Melton Krebs  has presented today for surgery, with the diagnosis of cad.  The various methods of treatment have been discussed with the patient and family. After consideration of risks, benefits and other options for treatment, the patient has consented to  Procedure(s): RIGHT/LEFT HEART CATH AND CORONARY ANGIOGRAPHY (N/A) as a surgical intervention.  The patient's history has been reviewed, patient examined, no change in status, stable for surgery.  I have reviewed the patient's chart and labs.  Questions were answered to the patient's satisfaction.   Cath Lab Visit (complete for each Cath Lab visit)  Clinical Evaluation Leading to the Procedure:   ACS: No.  Non-ACS:    Anginal Classification: CCS III  Anti-ischemic medical therapy: Minimal Therapy (1 class of medications)  Non-Invasive Test Results: Intermediate-risk stress test findings: cardiac mortality 1-3%/year  Prior CABG: No previous CABG        Theron Arista [facility] 04/16/2023 7:17 AM

## 2023-04-16 NOTE — Progress Notes (Addendum)
Report taken from [facility]. Manual cuff on rt arm, deflating 3-61mm every 3 minutes. Rt arm elevated. Sensation present; rt hand and fingers slightly ruddy; 1+ palpable rt radial pulse.

## 2023-04-16 NOTE — Plan of Care (Signed)
  Problem: Education: Goal: Understanding of CV disease, CV risk reduction, and recovery process will improve Outcome: Progressing   Problem: Activity: Goal: Ability to return to baseline activity level will improve Outcome: Progressing   Problem: Cardiovascular: Goal: Ability to achieve and maintain adequate cardiovascular perfusion will improve Outcome: Progressing Goal: Vascular access site(s) Level 0-1 will be maintained Outcome: Progressing   

## 2023-04-16 NOTE — Telephone Encounter (Signed)
Tried calling patient with no answer, left detail message Per DPR on patient voicemail making her aware of [name] response. Patient's husband Rachel Odonnell was also notified  and voiced understanding. Patient's husband Rachel Odonnell states that patient gave her complete medication list to preop and MD.

## 2023-04-16 NOTE — Telephone Encounter (Signed)
-----   Message from [place]ita [place] sent at 04/15/2023  4:07 PM EDT ----- Please let pt know urine culture result - the Macrobid which was prescribed should cover UTI based on culture sensitivities. She is scheduled for cardiac cath procedure tomorrow and needs to notify surgeon (Dr. Peter Swaziland) preop of this finding. I will try to message him also.

## 2023-04-17 ENCOUNTER — Other Ambulatory Visit: Payer: Self-pay

## 2023-04-17 DIAGNOSIS — I209 Angina pectoris, unspecified: Secondary | ICD-10-CM

## 2023-04-17 DIAGNOSIS — Z7901 Long term (current) use of anticoagulants: Secondary | ICD-10-CM | POA: Diagnosis not present

## 2023-04-17 DIAGNOSIS — I25119 Atherosclerotic heart disease of native coronary artery with unspecified angina pectoris: Secondary | ICD-10-CM | POA: Diagnosis not present

## 2023-04-17 DIAGNOSIS — Z955 Presence of coronary angioplasty implant and graft: Secondary | ICD-10-CM | POA: Diagnosis not present

## 2023-04-17 DIAGNOSIS — I10 Essential (primary) hypertension: Secondary | ICD-10-CM | POA: Diagnosis not present

## 2023-04-17 DIAGNOSIS — I480 Paroxysmal atrial fibrillation: Secondary | ICD-10-CM | POA: Diagnosis not present

## 2023-04-17 DIAGNOSIS — I272 Pulmonary hypertension, unspecified: Secondary | ICD-10-CM | POA: Insufficient documentation

## 2023-04-17 DIAGNOSIS — I2510 Atherosclerotic heart disease of native coronary artery without angina pectoris: Secondary | ICD-10-CM | POA: Insufficient documentation

## 2023-04-17 DIAGNOSIS — E782 Mixed hyperlipidemia: Secondary | ICD-10-CM | POA: Diagnosis not present

## 2023-04-17 DIAGNOSIS — E1159 Type 2 diabetes mellitus with other circulatory complications: Secondary | ICD-10-CM | POA: Diagnosis not present

## 2023-04-17 DIAGNOSIS — G4733 Obstructive sleep apnea (adult) (pediatric): Secondary | ICD-10-CM | POA: Diagnosis not present

## 2023-04-17 LAB — BASIC METABOLIC PANEL
Anion gap: 13 (ref 5–15)
BUN: 13 mg/dL (ref 8–23)
CO2: 25 mmol/L (ref 22–32)
Calcium: 10.3 mg/dL (ref 8.9–10.3)
Chloride: 95 mmol/L — ABNORMAL LOW (ref 98–111)
Creatinine, Ser: 1.16 mg/dL — ABNORMAL HIGH (ref 0.44–1.00)
GFR, Estimated: 52 mL/min — ABNORMAL LOW (ref >60)
Glucose, Bld: 322 mg/dL — ABNORMAL HIGH (ref 70–99)
Potassium: 4 mmol/L (ref 3.5–5.1)
Sodium: 133 mmol/L — ABNORMAL LOW (ref 135–145)

## 2023-04-17 LAB — HEMOGLOBIN A1C
Hgb A1c MFr Bld: 11.4 % — ABNORMAL HIGH (ref 4.8–5.6)
Mean Plasma Glucose: 280 mg/dL

## 2023-04-17 LAB — CBC
HCT: 37.8 % (ref 36.0–46.0)
Hemoglobin: 12.6 g/dL (ref 12.0–15.0)
MCH: 31 pg (ref 26.0–34.0)
MCHC: 33.3 g/dL (ref 30.0–36.0)
MCV: 93.1 fL (ref 80.0–100.0)
Platelets: 250 10*3/uL (ref 150–400)
RBC: 4.06 MIL/uL (ref 3.87–5.11)
RDW: 12.7 % (ref 11.5–15.5)
WBC: 7.1 10*3/uL (ref 4.0–10.5)
nRBC: 0 % (ref 0.0–0.2)

## 2023-04-17 LAB — GLUCOSE, CAPILLARY
Glucose-Capillary: 292 mg/dL — ABNORMAL HIGH (ref 70–99)
Glucose-Capillary: 304 mg/dL — ABNORMAL HIGH (ref 70–99)

## 2023-04-17 MED ORDER — CITALOPRAM HYDROBROMIDE 20 MG PO TABS: 20.0000 mg | ORAL_TABLET | Freq: Every evening | ORAL | Status: DC

## 2023-04-17 MED ORDER — CLOPIDOGREL BISULFATE 75 MG PO TABS: 75.0000 mg | ORAL_TABLET | Freq: Every day | ORAL | 5 refills | Status: DC

## 2023-04-17 MED ORDER — ROSUVASTATIN CALCIUM 20 MG PO TABS: 40.0000 mg | ORAL_TABLET | Freq: Every day | ORAL | 5 refills | Status: DC

## 2023-04-17 MED ORDER — PANTOPRAZOLE SODIUM 40 MG PO TBEC: 80.0000 mg | DELAYED_RELEASE_TABLET | Freq: Every day | ORAL | 1 refills | Status: DC

## 2023-04-17 MED ORDER — ASPIRIN 81 MG PO TBEC: 81.0000 mg | DELAYED_RELEASE_TABLET | Freq: Every day | ORAL | Status: DC

## 2023-04-17 MED ORDER — ROSUVASTATIN CALCIUM 40 MG PO TABS: 40.0000 mg | ORAL_TABLET | Freq: Every day | ORAL | 5 refills | Status: AC

## 2023-04-17 MED ORDER — GABAPENTIN 100 MG PO CAPS: 100.0000 mg | ORAL_CAPSULE | Freq: Every day | ORAL | Status: AC

## 2023-04-17 MED ORDER — ASPIRIN 81 MG PO TBEC: 81.0000 mg | DELAYED_RELEASE_TABLET | Freq: Every day | ORAL | 0 refills | Status: DC

## 2023-04-17 MED ORDER — CITALOPRAM HYDROBROMIDE 20 MG PO TABS: 20.0000 mg | ORAL_TABLET | Freq: Every evening | ORAL | 1 refills | Status: DC

## 2023-04-17 MED ORDER — PANTOPRAZOLE SODIUM 40 MG PO TBEC
80.0000 mg | DELAYED_RELEASE_TABLET | Freq: Every day | ORAL | Status: DC
  Administered 2023-04-17: 80 mg via ORAL
  Filled 2023-04-17: qty 2

## 2023-04-17 NOTE — Progress Notes (Signed)
CARDIAC REHAB PHASE I   PRE:  Rate/Rhythm: SR with PVC's  BP:  Sitting: 139/64      SaO2:   MODE:  Ambulation: 60 ft   POST:  Rate/Rhythm: SR with PVC's  BP:  Sitting: 145/72      SaO2: 95% Pt received in bed, agrees to education/ambulation. Pt voices she is very tired as she has not slept much. Provided education S/P PCI stent. Pt voices she has stent card in her bag. Encouraged to copy/carry. Reviewed wound care of cath site, restrictions, and s/s to report to MD. Reviewed heart healthy diet, importance of exercise and managing her diabetes. Provided handouts on nutrition and diabetes. Discussed and provided handouts on low Na diet. Reviewed activity guidelines, and NTG use.  Stressed compliance with all medications, especially Plavix, and follow-up with MD. Referral for CRP2 program placed to [facility]   Pt ambulated with slow gait. Pt voices feeling very tired. After sitting pt voices feeling SOB, SaO2 = 95%. Primary RN, Selena Batten, informed of SOB. Pt back to bed with tray table in reach and call bell in reach.   Lorin Picket, MS, ACSM EP-C, [facility] 04/17/2023  8:45 - 9:43

## 2023-04-17 NOTE — Discharge Summary (Addendum)
Discharge Summary    Patient ID: Rachel Odonnell MRN: [id number]; DOB: September 09, 1955  Admit date: 04/16/2023 Discharge date: 04/17/2023  PCP:  Sheela Stack   [facility] Providers Cardiologist:  Rachel Dell, MD  Cardiology APP:  Netta Neat., NP (Inactive)       Discharge Diagnoses    Principal Problem:   Angina pectoris [facility]) Active Problems:   DM type 2 causing vascular disease (HCC)   Essential hypertension, benign   Mixed hyperlipidemia   Atrial fibrillation (HCC)   CAD (coronary artery disease)   Mild pulmonary hypertension (HCC)   Diagnostic Studies/Procedures    Cath 04/16/23    Prox RCA to Mid RCA lesion is 35% stenosed.   1st Mrg lesion is 60% stenosed.   Prox LAD to Mid LAD lesion is 60% stenosed.   A drug-eluting stent was successfully placed using a SYNERGY XD 3.0X38.   Post intervention, there is a 0% residual stenosis.   1st Diag lesion is 40% stenosed.   Balloon angioplasty was performed using a BALLN SAPPHIRE 2.0X12.   Post intervention, there is a 0% residual stenosis.   The left ventricular systolic function is normal.   LV end diastolic pressure is mildly elevated.   The left ventricular ejection fraction is 55-65% by visual estimate.   Hemodynamic findings consistent with mild pulmonary hypertension.   Recommend to resume Warfarin, at currently prescribed dose and frequency on 04/17/2023.   Recommend concurrent antiplatelet therapy of Aspirin 81 mg for 1 month and Clopidogrel 75mg  daily for 6 months .   Single vessel obstructive CAD. Long segment of disease in the proximal to mid LAD. Angiographically this did not appear to be severe but flow was impaired on both CT FFR and directly measured RFR. Severe plaque on IVUS with Minimal lumen diameter of 2.5 mm squared.  Normal LV function. Mildly elevated LV filling pressures. PCWP 21/23 mean 16 mm Hg. LVEDP 21 mm Hg Mild pulmonary HTN PAP 40/14 mean 27 mm Hg Cardiac  output 4.3 L/min with index 2.08. Successful PCI of the proximal to mid LAD with IVUS guidance and DES x 1. Abrupt occlusion of diagonal that was jailed by stent. Successfully treated with POBA through the stent struts.    Plan: will monitor overnight on telemetry. DAPT with ASA for one month and Plavix for 6 months. May resume Coumadin tomorrow if no bleeding problems.  _____________   History of Present Illness     Rachel Odonnell is a 67 y.o. female with nonobstructive CAD 2022, mild AS, HTN, HLD, OSA, paroxysmal atrial fibrillation, ADD, DM who presented to [facility] for planned cardiac catheterization.  She had prior cath 2022 showing mild to moderate nonobstructive CAD.  LVEDP 11, mean PA 22, PCWP 2, CI 2.3. She has a long history of chronic SOB. PFTs done December 2022 with some restriction likely secondary to body habitus.  She was seen in the ED 02/16/23 for bradycardia.  Patient presented to PCP office and had low blood pressure, dizziness, lightheadedness, and bradycardia.  IV fluids were administered.  MRI of the brain was also ordered which did not show any acute findings.  Patient was referred to neurology and stable for discharge. She has also been following with pulmonary/sleep medicine due to frequent sleeping requiring ProVigil. [name] PA-C evaluated the patient in follow-up from the ER visit and recommended echo + coronary CTA given the symptoms at ER visit. 2D Echo showed EF 60-65%, G1DD, trivial  MR, mild AS. Coronary CT was abnormal with CAC 153 (83%ile), moderate LAD/LCx plaque, aortic atherosclerosis, and FFR suggestive of significant stenosis in the LAD. Cardiac cath was recommended for further evaluation.  Hospital Course     She underwent cath with findings outlined above with DES to prox-mid LAD with abrupt occlusion of diagonal jailed by stent, successfully treated with POBA through the stent struts. It was recommended to continue ASA for 1 month then discontinue (last day  05/15/23), and continue clopidogrel for 6 months. It was recommended she resume Coumadin today. She had a right forearm hematoma yesterday. Dr. Jacques Navy evaluated her this morning and felt her cath site looks good without acute complication, she will add exam to attestation. Of note, her Cr is 1.16 today indicating mild renal insufficiency though CrCl is 79ml/min when adjusted for age/weight.Value 8/26 was 0.97 and 8/30 was 1.10. Dr. Jacques Navy does not feel any additional med changes need to be made at this time but did recommend to keep f/u 04/23/23 as scheduled at which time repeat BMET can be trended.   Regarding additional med changes: - Per discussion with pharmacist, resume PTA warfarin dosing today (Mon., Wed., Fri., and Sat, and 5 mg Sun., Tues., and Thurs.) She has INR check scheduled 04/20/23 which we will keep. - She was noted to be on Celexa 40mg  daily which interacts with Plavix, with a maximum recommended daily Celexa dose of 20mg  daily due to risk of QT prolongation, arrhythmia or bleeding. Dr. Jacques Navy recommended to decrease Celexa to 20mg  daily with patient to reach out to usual prescribing provider and request very close follow-up to monitor dose reduction - Prilosec was changed to equivalent dose of Protonix given Plavix interaction, recommended to discuss long term plan for med with PCP - Rosuvastatin dose was listed previously as 20mg  but patient taking 40mg  most recently; we updated rx to reflect 40mg  daily - She was also advised to hold her Metformin for 48 hours post-cath,OK to resume 04/19/23.  - She reported she is now taking gapabentin 100mg  at bedtime rather than TID. We updated the rx (no print) on AVS to reflect how she is taking this but recommended she discuss with the prescribing provider to update her actual rx if not already done so.   I discussed the above with the patient. I confirmed with [facility] in [place] that they have the new meds in stock. She does not work so did not need  work note.  Dr. Jacques Navy has seen and examined the patient today and feels she is stable for discharge.   Did the patient have an acute coronary syndrome (MI, NSTEMI, STEMI, etc) this admission?:  No                               Did the patient have a percutaneous coronary intervention (stent / angioplasty)?:  Yes.     Cath/PCI Registry Performance & Quality Measures: Aspirin prescribed? - Yes ADP Receptor Inhibitor (Plavix/Clopidogrel, Brilinta/Ticagrelor or Effient/Prasugrel) prescribed (includes medically managed patients)? - Yes High Intensity Statin (Lipitor 40-80mg  or Crestor 20-40mg ) prescribed? - Yes For EF <40%, was ACEI/ARB prescribed? - Not Applicable (EF >/= 40%) For EF <40%, Aldosterone Antagonist (Spironolactone or Eplerenone) prescribed? - Not Applicable (EF >/= 40%) Cardiac Rehab Phase II ordered? - Yes      _____________  Discharge Vitals Blood pressure (!) 141/67, pulse 63, temperature 98 F (36.7 C), temperature source Oral, resp. rate 20, height  5\' 4"  (1.626 m), weight 102.5 kg, SpO2 95%.  Filed Weights   04/16/23 0751  Weight: 102.5 kg    Labs & Radiologic Studies    CBC Recent Labs    04/16/23 1453 04/17/23 0500  WBC 6.4 7.1  HGB 12.2 12.6  HCT 37.0 37.8  MCV 96.4 93.1  PLT 254 250   Basic Metabolic Panel Recent Labs    95/62/13 0911 04/16/23 1453 04/17/23 0500  NA 136  --  133*  K 4.2  --  4.0  CL  --   --  95*  CO2  --   --  25  GLUCOSE  --   --  322*  BUN  --   --  13  CREATININE  --  1.10* 1.16*  CALCIUM  --   --  10.3   _____________  CARDIAC CATHETERIZATION  Result Date: 04/16/2023   Prox RCA to Mid RCA lesion is 35% stenosed.   1st Mrg lesion is 60% stenosed.   Prox LAD to Mid LAD lesion is 60% stenosed.   A drug-eluting stent was successfully placed using a SYNERGY XD 3.0X38.   Post intervention, there is a 0% residual stenosis.   1st Diag lesion is 40% stenosed.   Balloon angioplasty was performed using a BALLN SAPPHIRE 2.0X12.    Post intervention, there is a 0% residual stenosis.   The left ventricular systolic function is normal.   LV end diastolic pressure is mildly elevated.   The left ventricular ejection fraction is 55-65% by visual estimate.   Hemodynamic findings consistent with mild pulmonary hypertension.   Recommend to resume Warfarin, at currently prescribed dose and frequency on 04/17/2023.   Recommend concurrent antiplatelet therapy of Aspirin 81 mg for 1 month and Clopidogrel 75mg  daily for 6 months . Single vessel obstructive CAD. Long segment of disease in the proximal to mid LAD. Angiographically this did not appear to be severe but flow was impaired on both CT FFR and directly measured RFR. Severe plaque on IVUS with Minimal lumen diameter of 2.5 mm squared. Normal LV function. Mildly elevated LV filling pressures. PCWP 21/23 mean 16 mm Hg. LVEDP 21 mm Hg Mild pulmonary HTN PAP 40/14 mean 27 mm Hg Cardiac output 4.3 L/min with index 2.08. Successful PCI of the proximal to mid LAD with IVUS guidance and DES x 1. Abrupt occlusion of diagonal that was jailed by stent. Successfully treated with POBA through the stent struts. Plan: will monitor overnight on telemetry. DAPT with ASA for one month and Plavix for 6 months. May resume Coumadin tomorrow if no bleeding problems.   Disposition   Pt is being discharged home today in good condition.  Follow-up Plans & Appointments     Follow-up Information     [facility] Follow up.   Specialty: Cardiology Why: [facility] - [place] location - keep your Coumadin check at [facility] office on Tuesday Apr 20, 2023 at 3:45 PM (Arrive by 3:30 PM). Contact information: [address] [place] [address] [phone]        Gaston Islam., NP Follow up.   Specialty: Cardiology Why: [facility] - [address] location - keep your follow-up appointment as scheduled on Friday Apr 23, 2023 at 8:50 AM (Arrive by 8:35 AM). Contact  information: [address] [place] [address] [phone]                Discharge Instructions  Amb Referral to Cardiac Rehabilitation   Complete by: As directed    Diagnosis:  PTCA Coronary Stents     After initial evaluation and assessments completed: Virtual Based Care may be provided alone or in conjunction with Phase 2 Cardiac Rehab based on patient barriers.: Yes   Intensive Cardiac Rehabilitation (ICR) [facility] location only OR Traditional Cardiac Rehabilitation (TCR) *If criteria for ICR are not met will enroll in TCR [facility] only): Yes   Diet - low sodium heart healthy   Complete by: As directed    Discharge instructions   Complete by: As directed    !!! IMPORTANT: After a heart catheterization, you will need to avoid taking metformin for at least 48 hours. We would recommend you restart this on 04/19/23.  -Our pharmacist recommended that you restart your Coumadin/warfarin today at the dose you were taking before you were admitted to the hospital - this was 2.5mg  on Mon., Wed., Fri., and Sat, and 5 mg Sun., Tues., and Thurs. We'll keep your Coumadin check on 04/20/23 as scheduled.  - You were started on 2 additional new blood thinners, aspirin and Plavix/clopidogrel. You will take the aspirin for 30 days then stop, last dose 05/15/23. You will take the Plavix/clopidgrel for at least 6 months. Your doctor will recommend the long term plan for this medicine. If you notice any bleeding such as blood in stool, black tarry stools, blood in urine, nosebleeds or any other unusual bleeding, call your doctor immediately. It is not normal to have this kind of bleeding while on a blood thinner and usually indicates there is an underlying problem with one of your body systems that needs to be checked out.   -Your Celexa/citalopram dose was decreased from 40mg  to 20mg  daily due to the drug interaction with Plavix/clopidogrel. Please touch base with the prescriber of your  Celexa/citalopram on Tuesday when their office opens to discuss a close follow-up appointment.  -Some studies suggest omeprazole/Prilosec (acid reflux medicine) interacts with clopidogrel/Plavix (blood thinner). We changed your omeprazole/Prilosec to the equivalent dose of pantoprazole/Protonix for less chance of interaction. Please discuss long term plan for this class of medicine with your primary care provider.  - We updated your rosuvastatin prescription to 40mg  daily.  - Your home records report you are taking gabapentin differently than the prescription listed in our computer system. We updated the computer system to reflect 100mg  at bedtime but please ask the prescriber of his medicine to update your prescription in the records if they haven't already done so.   Increase activity slowly   Complete by: As directed    No driving for 2 days. Do not restart driving if you have otherwise been told not to drive for other reasons. No lifting over 5 lbs for 1 week. No sexual activity for 1 week. Keep procedure site clean & dry. If you notice increased pain, swelling, bleeding or pus, call/return!  You may shower, but no soaking baths/hot tubs/pools for 1 week.        Discharge Medications   Allergies as of 04/17/2023       Reactions   Azithromycin Hives   Codeine Nausea Only   Atomoxetine Nausea And Vomiting   Atorvastatin Other (See Comments)   Restless legs   Lisinopril Nausea And Vomiting        Medication List     STOP taking these medications    omeprazole 40 MG capsule Commonly known as: PRILOSEC Replaced by: pantoprazole 40 MG tablet  TAKE these medications    Accu-Chek Softclix Lancets lancets Use as instructed to test blood glucose four times daily   aspirin EC 81 MG tablet Take 1 tablet (81 mg total) by mouth daily. Swallow whole. Take for 30 days total then stop. Last dose 05/15/23. Start taking on: April 18, 2023   B-D ULTRAFINE III SHORT PEN 31G X  8 MM Misc Generic drug: Insulin Pen Needle 1 each by Does not apply route as directed.   busPIRone 5 MG tablet Commonly known as: BUSPAR Take 5 mg by mouth at bedtime.   citalopram 20 MG tablet Commonly known as: CELEXA Take 1 tablet (20 mg total) by mouth every evening. What changed:  medication strength how much to take   clopidogrel 75 MG tablet Commonly known as: PLAVIX Take 1 tablet (75 mg total) by mouth daily.   donepezil 5 MG tablet Commonly known as: ARICEPT Take 5 mg by mouth every evening.   estradiol 2 MG tablet Commonly known as: ESTRACE Take 1 mg by mouth every evening.   fenofibrate 145 MG tablet Commonly known as: TRICOR Take 145 mg by mouth every evening.   FreeStyle Libre 2 Reader Hardie Pulley USE TO CHECK GLUCOSE AS DIRECTED   FreeStyle Libre 2 Sensor Misc Change sensor every 14 days. Use to check glucose continuously   gabapentin 100 MG capsule Commonly known as: Neurontin Take 1 capsule (100 mg total) by mouth at bedtime.   glipiZIDE 5 MG tablet Commonly known as: GLUCOTROL Take 1 tablet (5 mg total) by mouth daily before breakfast.   glucose blood test strip 1 each by Other route as needed. Use as instructed to test blood glucose four times daily   indapamide 2.5 MG tablet Commonly known as: LOZOL Take 1 tablet (2.5 mg total) by mouth daily.   isosorbide mononitrate 60 MG 24 hr tablet Commonly known as: IMDUR Take 1 tablet (60 mg total) by mouth daily.   levocetirizine 5 MG tablet Commonly known as: XYZAL Take 5 mg by mouth every evening.   levothyroxine 50 MCG tablet Commonly known as: SYNTHROID TAKE 1 TABLET BY MOUTH ONCE DAILY BEFORE BREAKFAST   metFORMIN 500 MG tablet Commonly known as: GLUCOPHAGE Take 1 tablet (500 mg total) by mouth 2 (two) times daily with a meal. Notes to patient: !!! IMPORTANT: After a heart catheterization, you will need to avoid taking metformin for at least 48 hours. We would recommend you restart this on  04/19/23.   mirabegron ER 25 MG Tb24 tablet Commonly known as: MYRBETRIQ Take 1 tablet (25 mg total) by mouth daily.   modafinil 100 MG tablet Commonly known as: PROVIGIL Take 1 tablet (100 mg total) by mouth daily.   montelukast 10 MG tablet Commonly known as: SINGULAIR Take 10 mg by mouth at bedtime.   MULTIVITAMIN ADULT PO Take 1 tablet by mouth daily.   nitrofurantoin (macrocrystal-monohydrate) 100 MG capsule Commonly known as: MACROBID Take 1 capsule (100 mg total) by mouth 2 (two) times daily for 7 days.   nitroGLYCERIN 0.4 MG SL tablet Commonly known as: NITROSTAT Place 1 tablet (0.4 mg total) under the tongue every 5 (five) minutes as needed for chest pain.   NovoLOG FlexPen 100 UNIT/ML FlexPen Generic drug: insulin aspart Inject 10-16 Units into the skin 3 (three) times daily before meals. What changed: additional instructions   OVER THE COUNTER MEDICATION Take 2 capsules by mouth daily. Omega XL   pantoprazole 40 MG tablet Commonly known as: PROTONIX Take 2 tablets (  80 mg total) by mouth daily. Replaces: omeprazole 40 MG capsule   rosuvastatin 40 MG tablet Commonly known as: Crestor Take 1 tablet (40 mg total) by mouth daily. What changed:  medication strength how much to take   Tresiba FlexTouch 200 UNIT/ML FlexTouch Pen Generic drug: insulin degludec Inject 100 Units into the skin at bedtime.   Vitamin D3 125 MCG (5000 UT) Caps Take 1 capsule (5,000 Units total) by mouth daily.   warfarin 5 MG tablet Commonly known as: COUMADIN Take as directed. If you are unsure how to take this medication, talk to your nurse or doctor. Original instructions: Take 1/2 a tablet to 1 tablet by mouth daily as directed by the coumadin clinic. What changed:  how much to take how to take this when to take this additional instructions Notes to patient: Restart your Coumadin/warfarin today at the dose you were taking before you were admitted to the hospital - this was  2.5mg  on Mon., Wed., Fri., and Sat, and 5 mg Sun., Tues., and Thurs.            Outstanding Labs/Studies   N/a  Duration of Discharge Encounter   Greater than 30 minutes including physician time.  Signed, Laurann Montana, PA-C 04/17/2023, 12:13 PM  Patient seen and examined with Ronie Spies, PA-C.  Agree as above, with the following exceptions and changes as noted below. No chest pain, right arm ecchymosis is without induration, right ulnar pulse normal. Gen: NAD, CV: RRR, no murmurs, Lungs: clear, Abd: soft, Extrem: Warm, well perfused, no edema, Neuro/Psych: alert and oriented x 3, normal mood and affect. All available labs, radiology testing, previous records reviewed. Agree as above. I have instructed the patient that dual antiplatelet therapy should be taken as noted by interventional report without interruption.  We have discussed the consequences of interrupted dual antiplatelet therapy and the risk for in-stent thrombosis. Triple therapy for 1 month, then dual therapy for 6 mo. Follow up already arranged. Stable for dc home today, husband present and will assist at home as needed.    Parke Poisson, MD 04/17/23 12:43 PM

## 2023-04-20 ENCOUNTER — Encounter (HOSPITAL_COMMUNITY): Payer: Self-pay | Admitting: Cardiology

## 2023-04-20 ENCOUNTER — Ambulatory Visit: Payer: Medicare HMO | Attending: Cardiology | Admitting: *Deleted

## 2023-04-20 DIAGNOSIS — I4821 Permanent atrial fibrillation: Secondary | ICD-10-CM

## 2023-04-20 DIAGNOSIS — Z5181 Encounter for therapeutic drug level monitoring: Secondary | ICD-10-CM | POA: Diagnosis not present

## 2023-04-20 LAB — LIPOPROTEIN A (LPA): Lipoprotein (a): 148 nmol/L — ABNORMAL HIGH (ref <75.0)

## 2023-04-20 LAB — POCT INR: INR: 1.2 — AB (ref 2.0–3.0)

## 2023-04-20 NOTE — Patient Instructions (Signed)
Take warfarin 1 1/2 tablets tonight and tomorrow night then resume 1/2 tablet daily except 1 tablet on Sundays, Tuesdays and Thursdays. Recheck INR in 1 week

## 2023-04-21 ENCOUNTER — Encounter: Payer: Self-pay | Admitting: Pulmonary Disease

## 2023-04-21 ENCOUNTER — Ambulatory Visit: Payer: Medicare HMO | Admitting: Pulmonary Disease

## 2023-04-21 VITALS — BP 140/62 | HR 77 | Ht 64.0 in | Wt 225.0 lb

## 2023-04-21 DIAGNOSIS — J432 Centrilobular emphysema: Secondary | ICD-10-CM

## 2023-04-21 DIAGNOSIS — G4710 Hypersomnia, unspecified: Secondary | ICD-10-CM

## 2023-04-21 DIAGNOSIS — G4733 Obstructive sleep apnea (adult) (pediatric): Secondary | ICD-10-CM

## 2023-04-21 NOTE — Progress Notes (Signed)
   Subjective:    Patient ID: Rachel Odonnell, female    DOB: 12/25/1955, 67 y.o.   MRN: [id number]  HPI  67 yo ex-smoker with mild emphysema ,OSA  Her persistent hypersomnolence is likely related to  delayed sleep phase - worked night shift x 32 y -started on provigil 02/2022    -RLS on requip    PMH -ADD - was on Adderall. -Atrial fibrillation -memory issues  on Aricept  -CAD s/p stent to LAD 03/2023  51-month follow-up visit. We discussed her body clock anxious last office visit and advised melatonin plus Provigil. We need to adjust auto CPAP to 10 to 14 cm but she remains on previous settings of 8 to 14 cm She underwent reports stent to proximal LAD.  She is now on Plavix and warfarin. She has settled down with fullface mask.  She has a few missed nights on her CPAP download which is because she was out of town and did not take her machine with her. She uses modafinil on an as-needed basis  Significant tests/ events reviewed   CPAP titration 10/2021 13 cm MSLT  5/5 naps,MSL 2:50 s, no SoREMs   2022 split >>AHI 43/hour >> nasal CPAP of 10 cm   CTA chest 02/2021 mild coronary artery calcification, mild paraseptal emphysema  - non obs CAD 05/2021 LHC/RHC nml cors, PA pressures   PFTs 07/2021 mild restriction, ratio 82, FEV1 84%, FVC 78%, DLCO 75%  Review of Systems neg for any significant sore throat, dysphagia, itching, sneezing, nasal congestion or excess/ purulent secretions, fever, chills, sweats, unintended wt loss, pleuritic or exertional cp, hempoptysis, orthopnea pnd or change in chronic leg swelling. Also denies presyncope, palpitations, heartburn, abdominal pain, nausea, vomiting, diarrhea or change in bowel or urinary habits, dysuria,hematuria, rash, arthralgias, visual complaints, headache, numbness weakness or ataxia.     Objective:   Physical Exam  Gen. Pleasant, obese, in no distress ENT - no lesions, no post nasal drip Neck: No JVD, no thyromegaly, no carotid  bruits Lungs: no use of accessory muscles, no dullness to percussion, decreased without rales or rhonchi  Cardiovascular: Rhythm regular, heart sounds  normal, no murmurs or gallops, no peripheral edema Musculoskeletal: No deformities, no cyanosis or clubbing , no tremors       Assessment & Plan:

## 2023-04-21 NOTE — Assessment & Plan Note (Signed)
CPAP download was reviewed which shows residual AHI of 8/hour on auto CPAP settings 8-14 cmH2O  Will ask  DME to adjust auto CPAP to 10 to 14 cm Weight loss encouraged, compliance with goal of at least 4-6 hrs every night is the expectation. Advised against medications with sedative side effects Cautioned against driving when sleepy - understanding that sleepiness will vary on a day to day basis

## 2023-04-21 NOTE — Assessment & Plan Note (Signed)
Appears mild. Does not require medications

## 2023-04-21 NOTE — Assessment & Plan Note (Signed)
Discussed use of modafinil to improve her daytime functioning.  She can use this on an as-needed basis Also discussed sunlight exposure for her circadian rhythm issues

## 2023-04-21 NOTE — Patient Instructions (Signed)
X Change autoCPAP to 10-14 cm - [facility]  X refill on modafinil

## 2023-04-22 DIAGNOSIS — G4733 Obstructive sleep apnea (adult) (pediatric): Secondary | ICD-10-CM | POA: Diagnosis not present

## 2023-04-22 DIAGNOSIS — G4710 Hypersomnia, unspecified: Secondary | ICD-10-CM | POA: Diagnosis not present

## 2023-04-22 DIAGNOSIS — F0390 Unspecified dementia without behavioral disturbance: Secondary | ICD-10-CM | POA: Diagnosis not present

## 2023-04-22 DIAGNOSIS — E039 Hypothyroidism, unspecified: Secondary | ICD-10-CM | POA: Diagnosis not present

## 2023-04-22 DIAGNOSIS — E785 Hyperlipidemia, unspecified: Secondary | ICD-10-CM | POA: Diagnosis not present

## 2023-04-22 DIAGNOSIS — M47816 Spondylosis without myelopathy or radiculopathy, lumbar region: Secondary | ICD-10-CM | POA: Diagnosis not present

## 2023-04-22 DIAGNOSIS — K219 Gastro-esophageal reflux disease without esophagitis: Secondary | ICD-10-CM | POA: Diagnosis not present

## 2023-04-22 DIAGNOSIS — F339 Major depressive disorder, recurrent, unspecified: Secondary | ICD-10-CM | POA: Diagnosis not present

## 2023-04-22 DIAGNOSIS — F325 Major depressive disorder, single episode, in full remission: Secondary | ICD-10-CM | POA: Diagnosis not present

## 2023-04-22 DIAGNOSIS — E1165 Type 2 diabetes mellitus with hyperglycemia: Secondary | ICD-10-CM | POA: Diagnosis not present

## 2023-04-22 NOTE — Progress Notes (Signed)
Cardiology Office Note    Patient Name: Rachel Odonnell Date of Encounter: 04/22/2023  Primary Care Provider:  Royann Shivers, PA-C Primary Cardiologist:  Nona Dell, MD Primary Electrophysiologist: None   Past Medical History    Past Medical History:  Diagnosis Date   ADD (attention deficit disorder)    Diabetes mellitus without complication (HCC)    Hypertension     History of Present Illness  Rachel Odonnell is a 67 y.o. female with a PMH of CAD s/p DES to prox to mid LAD with provide 2 jailed diagonal, PAF (on Coumadin), OSA, HLD, HTN who presents today for post PCI follow-up.  Rachel Odonnell has been followed by Dr. Dina Rich since 07/2021 for management of CAD.  She had a right and left heart cath completed 05/2021 that showed nonobstructive disease with normal heart pressures.  With AF in 07/2021 and started on [facility] but later switched to Coumadin due to cost.  She was seen on 02/19/23 by [name], PA with complaint of shortness of breath and coronary CTA was ordered that showed possible significant blockage in LAD with FFR analysis confirming.  She completed a R/Left heart cath for further evaluation that was completed on 04/16/2023 that showed single-vessel CAD treated with PCI/DES x 1 to LAD and POBA to the occluded diagonal that was successful.  She was found to have mild pulmonary HTN and mildly elevated LV pressures.  She was started on ASA and Plavix for 6 months and developed right forearm hematoma following her heart cath that later resolved.  She was discharged in stable condition on 04/17/2023.  During today's visit the patient reports that she is feeling better but did experience a few episodes of chest pain that was fleeting.  This discomfort was not associated with any activity.  She is staying active and walks 7 minutes/day with no chest pain occurrences.  During today's visit her blood pressure was controlled at 126/60 and heart rate was 61 bpm.  She is euvolemic on  examination today and has tolerated her current medications without any adverse reactions.  She does have some increased bruising due to current triple therapy but denies any bleeding in urine or stool.  We reviewed her cath results and patient had all questions answered to her satisfaction.  Patient denies chest pain, palpitations, dyspnea, PND, orthopnea, nausea, vomiting, dizziness, syncope, edema, weight gain, or early satiety.   Review of Systems  Please see the history of present illness.    All other systems reviewed and are otherwise negative except as noted above.  Physical Exam    Wt Readings from Last 3 Encounters:  04/21/23 225 lb (102.1 kg)  04/16/23 226 lb (102.5 kg)  04/12/23 226 lb 12.8 oz (102.9 kg)   ON:GEXBM were no vitals filed for this visit.,There is no height or weight on file to calculate BMI. GEN: Well nourished, well developed in no acute distress Neck: No JVD; No carotid bruits Pulmonary: Clear to auscultation without rales, wheezing or rhonchi  Cardiovascular: Normal rate. Regular rhythm. Normal S1. Normal S2.   Murmurs: There is no murmur.  ABDOMEN: Soft, non-tender, non-distended EXTREMITIES:  No edema; No deformity   EKG/LABS/ Recent Cardiac Studies   ECG personally reviewed by me today -sinus rhythm with first-degree AVB and rate of 60 bpm with T WI in leads II, III, V3, V4 V5 and V6 no acute changes noted consistent with previous EKG.  Risk Assessment/Calculations:    CHA2DS2-VASc Score = 4  This indicates a 4.8% annual risk of stroke. The patient's score is based upon: CHF History: 0 HTN History: 1 Diabetes History: 0 Stroke History: 0 Vascular Disease History: 1 Age Score: 1 Gender Score: 1         Lab Results  Component Value Date   WBC 7.1 04/17/2023   HGB 12.6 04/17/2023   HCT 37.8 04/17/2023   MCV 93.1 04/17/2023   PLT 250 04/17/2023   Lab Results  Component Value Date   CREATININE 1.16 (H) 04/17/2023   BUN 13 04/17/2023    NA 133 (L) 04/17/2023   K 4.0 04/17/2023   CL 95 (L) 04/17/2023   CO2 25 04/17/2023   Lab Results  Component Value Date   CHOL 174 02/19/2023   HDL 43 02/19/2023   LDLCALC 91 02/19/2023   TRIG 239 (H) 02/19/2023   CHOLHDL 4.0 02/19/2023    Lab Results  Component Value Date   HGBA1C 11.4 (H) 04/16/2023   Assessment & Plan    1.  Coronary artery disease: -s/p R/LHC DES to prox-mid LAD with abrupt occlusion of diagonal jailed by stent, successfully treated with POBA  -Today patient reports some fleeting episodes of chest pain but no sustained discomfort or pain with activity. -She is currently on triple therapy with ASA, Plavix, and warfarin and will discontinue aspirin on 05/17/2023. -Continue current GDMT with ASA 81 mg, Plavix 75 mg, fenofibrate 145 mg Crestor 40 mg, Imdur 60 mg daily -Patient can take additional as needed 30 mg of Imdur for chest discomfort -Patient is cleared to start cardiac rehab  2.  Permanent AF: -Today patient is rate controlled of 60 bpm. -Continue Coumadin as prescribed -CHA2DS2-VASc Score = 4 [CHF History: 0, HTN History: 1, Diabetes History: 0, Stroke History: 0, Vascular Disease History: 1, Age Score: 1, Gender Score: 1].  Therefore, the patient's annual risk of stroke is 4.8 %.      3.  Essential hypertension: -Patient's blood pressure today was controlled at 126/60 -Continue low-sodium heart healthy diet  4.  Hyperlipidemia: -Patient's LDL cholesterol was 91 above goal -Continue current treatment plan with fenofibrate 145 mg daily, Crestor 40 mg daily  5.  DM type II: -Patient's last hemoglobin A1c was 11.4 -Continue current treatment plan per PCP    Cardiac Rehabilitation Eligibility Assessment       Disposition: Follow-up with Nona Dell, MD or APP in 1 months    Signed, Napoleon Form, Leodis Rains, NP 04/22/2023, 12:52 PM [facility]

## 2023-04-23 ENCOUNTER — Encounter: Payer: Self-pay | Admitting: Nurse Practitioner

## 2023-04-23 ENCOUNTER — Ambulatory Visit: Payer: Medicare HMO | Attending: Nurse Practitioner | Admitting: Nurse Practitioner

## 2023-04-23 ENCOUNTER — Other Ambulatory Visit: Payer: Self-pay | Admitting: *Deleted

## 2023-04-23 VITALS — BP 126/60 | HR 61 | Ht 64.0 in | Wt 227.0 lb

## 2023-04-23 DIAGNOSIS — Z794 Long term (current) use of insulin: Secondary | ICD-10-CM

## 2023-04-23 DIAGNOSIS — I4821 Permanent atrial fibrillation: Secondary | ICD-10-CM

## 2023-04-23 DIAGNOSIS — E119 Type 2 diabetes mellitus without complications: Secondary | ICD-10-CM | POA: Diagnosis not present

## 2023-04-23 DIAGNOSIS — I10 Essential (primary) hypertension: Secondary | ICD-10-CM

## 2023-04-23 DIAGNOSIS — I2510 Atherosclerotic heart disease of native coronary artery without angina pectoris: Secondary | ICD-10-CM | POA: Diagnosis not present

## 2023-04-23 DIAGNOSIS — E785 Hyperlipidemia, unspecified: Secondary | ICD-10-CM | POA: Diagnosis not present

## 2023-04-23 MED ORDER — CLOPIDOGREL BISULFATE 75 MG PO TABS: 75.0000 mg | ORAL_TABLET | Freq: Every day | ORAL | 3 refills | Status: DC

## 2023-04-23 MED ORDER — ISOSORBIDE MONONITRATE ER 60 MG PO TB24: 60.0000 mg | ORAL_TABLET | Freq: Every day | ORAL | Status: DC

## 2023-04-23 NOTE — Patient Instructions (Addendum)
Medication Instructions:  You can take an additional 30mg  of Imdur for chest pain PLEASE STOP Aspirin on May 17, 2023. *If you need a refill on your cardiac medications before your next appointment, please call your pharmacy*   Lab Work: None ordered   Testing/Procedures: None ordered   Follow-Up: At [facility], you and your health needs are our priority.  As part of our continuing mission to provide you with exceptional heart care, we have created designated Provider Care Teams.  These Care Teams include your primary Cardiologist (physician) and Advanced Practice Providers (APPs -  Physician Assistants and Nurse Practitioners) who all work together to provide you with the care you need, when you need it.  We recommend signing up for the patient portal called "MyChart".  Sign up information is provided on this After Visit Summary.  MyChart is used to connect with patients for Virtual Visits (Telemedicine).  Patients are able to view lab/test results, encounter notes, upcoming appointments, etc.  Non-urgent messages can be sent to your provider as well.   To learn more about what you can do with MyChart, go to [email or web address].    Your next appointment:   1 month(s)  Provider:   You may see Dina Rich, MD or one of the following Advanced Practice Providers on your designated Care Team:   Randall An, PA-C  Jacolyn Reedy, [place] Sharlene Dory, NP     Other Instructions Elevate your arm with a pillow, you can take tylenol as needed for pain. Please contact the office if the pain in your arm is no better.

## 2023-04-26 ENCOUNTER — Ambulatory Visit: Payer: Medicare HMO | Attending: Cardiology | Admitting: *Deleted

## 2023-04-26 DIAGNOSIS — Z5181 Encounter for therapeutic drug level monitoring: Secondary | ICD-10-CM | POA: Diagnosis not present

## 2023-04-26 DIAGNOSIS — I4821 Permanent atrial fibrillation: Secondary | ICD-10-CM

## 2023-04-26 LAB — POCT INR: INR: 1.8 — AB (ref 2.0–3.0)

## 2023-04-26 NOTE — Patient Instructions (Signed)
Take warfarin 1 tablet tonight then increase dose to 1 tablet daily except 1/2 tablet on Mondays, Wednesdays and Fridays  Recheck INR in 1 week

## 2023-05-05 ENCOUNTER — Other Ambulatory Visit: Payer: Self-pay | Admitting: Urology

## 2023-05-05 DIAGNOSIS — R3915 Urgency of urination: Secondary | ICD-10-CM

## 2023-05-05 DIAGNOSIS — N3941 Urge incontinence: Secondary | ICD-10-CM

## 2023-05-05 MED ORDER — MIRABEGRON ER 25 MG PO TB24: 25.0000 mg | ORAL_TABLET | Freq: Every day | ORAL | 11 refills | Status: DC

## 2023-05-11 ENCOUNTER — Encounter (HOSPITAL_COMMUNITY): Payer: Self-pay

## 2023-05-12 ENCOUNTER — Ambulatory Visit (INDEPENDENT_AMBULATORY_CARE_PROVIDER_SITE_OTHER): Payer: Medicare HMO | Admitting: *Deleted

## 2023-05-12 ENCOUNTER — Ambulatory Visit (HOSPITAL_COMMUNITY)
Admission: RE | Admit: 2023-05-12 | Discharge: 2023-05-12 | Disposition: A | Discharge status: Home / Self Care | Payer: Medicare HMO | Source: Ambulatory Visit | Attending: Cardiology | Admitting: Cardiology

## 2023-05-12 DIAGNOSIS — I4821 Permanent atrial fibrillation: Secondary | ICD-10-CM | POA: Insufficient documentation

## 2023-05-12 DIAGNOSIS — Z5181 Encounter for therapeutic drug level monitoring: Secondary | ICD-10-CM | POA: Diagnosis not present

## 2023-05-12 DIAGNOSIS — Z955 Presence of coronary angioplasty implant and graft: Secondary | ICD-10-CM | POA: Insufficient documentation

## 2023-05-12 LAB — POCT INR: INR: 4 — AB (ref 2.0–3.0)

## 2023-05-12 NOTE — Patient Instructions (Signed)
Hold warfarin tonight then decrease dose to 1/2 tablet daily except 1 tablet on Sundays, Tuesdays and Thursdays  Recheck INR in 2 weeks

## 2023-05-13 DIAGNOSIS — S134XXA Sprain of ligaments of cervical spine, initial encounter: Secondary | ICD-10-CM | POA: Diagnosis not present

## 2023-05-13 DIAGNOSIS — M9902 Segmental and somatic dysfunction of thoracic region: Secondary | ICD-10-CM | POA: Diagnosis not present

## 2023-05-13 DIAGNOSIS — M9901 Segmental and somatic dysfunction of cervical region: Secondary | ICD-10-CM | POA: Diagnosis not present

## 2023-05-13 DIAGNOSIS — S233XXA Sprain of ligaments of thoracic spine, initial encounter: Secondary | ICD-10-CM | POA: Diagnosis not present

## 2023-05-13 DIAGNOSIS — S338XXA Sprain of other parts of lumbar spine and pelvis, initial encounter: Secondary | ICD-10-CM | POA: Diagnosis not present

## 2023-05-13 DIAGNOSIS — M9903 Segmental and somatic dysfunction of lumbar region: Secondary | ICD-10-CM | POA: Diagnosis not present

## 2023-05-14 ENCOUNTER — Other Ambulatory Visit: Payer: Self-pay | Admitting: "Endocrinology

## 2023-05-14 DIAGNOSIS — E1159 Type 2 diabetes mellitus with other circulatory complications: Secondary | ICD-10-CM

## 2023-05-14 NOTE — Telephone Encounter (Signed)
Requesting clarification for metformin. Last office note states continue metformin 500mg  daily but last Rx states metformin 500mg  bid.

## 2023-05-16 DIAGNOSIS — E1165 Type 2 diabetes mellitus with hyperglycemia: Secondary | ICD-10-CM | POA: Diagnosis not present

## 2023-05-17 DIAGNOSIS — E1165 Type 2 diabetes mellitus with hyperglycemia: Secondary | ICD-10-CM | POA: Diagnosis not present

## 2023-05-17 DIAGNOSIS — E785 Hyperlipidemia, unspecified: Secondary | ICD-10-CM | POA: Diagnosis not present

## 2023-05-18 ENCOUNTER — Ambulatory Visit (INDEPENDENT_AMBULATORY_CARE_PROVIDER_SITE_OTHER): Payer: Medicare HMO | Admitting: Neurology

## 2023-05-18 ENCOUNTER — Encounter: Payer: Self-pay | Admitting: Neurology

## 2023-05-18 VITALS — BP 124/71 | HR 61 | Resp 16 | Ht 64.0 in

## 2023-05-18 DIAGNOSIS — R413 Other amnesia: Secondary | ICD-10-CM | POA: Diagnosis not present

## 2023-05-18 DIAGNOSIS — G4733 Obstructive sleep apnea (adult) (pediatric): Secondary | ICD-10-CM | POA: Diagnosis not present

## 2023-05-18 DIAGNOSIS — G4710 Hypersomnia, unspecified: Secondary | ICD-10-CM | POA: Diagnosis not present

## 2023-05-18 DIAGNOSIS — G2581 Restless legs syndrome: Secondary | ICD-10-CM | POA: Diagnosis not present

## 2023-05-18 NOTE — Progress Notes (Signed)
Chief Complaint  Patient presents with   Dizziness    RM15, HUSBAND PRESENT, internal referral for Dizziness: ORTHOSTATIC BP COMPLETED      ASSESSMENT AND PLAN  Rachel Odonnell is a 67 y.o. female   Cognitive impairment  MoCA examination 21/30  MRI of brain in July 2024 no acute abnormality mild small vessel disease,  laboratory evaluation from primary care physician Excessive daytime sleepiness, fatigue,  Known history of obstructive sleep apnea, on CPAP,  Sleep study in March 2023 showed findings consistent with obstructive sleep apnea, that was corrected by CPAP, mean sleep latency was 2 minutes 50 seconds, consistent with idiopathic hypersomnia    Encouraged her moderate exercise,   Return to clinic for new issues DIAGNOSTIC DATA (LABS, IMAGING, TESTING) - I reviewed patient records, labs, notes, testing and imaging myself where available.   MEDICAL HISTORY:  Rachel Odonnell is a 67 year old female, seen in request by her primary care PA Wayland Denis for evaluation of bilateral hands tremor, excessive daytime sleepiness, fatigue, initial evaluation was on November 17, 2021  I reviewed and summarized the referring note. PMHX. DM HTN CAD Atrial fibrillation, OSA-CPAP  She denies family history of tremor, around September 2022 she noticed left hand action tremor, shaky when she holds up her left hand, recent involvement of right hand,  She had long history of obesity, obstructive sleep apnea, using CPAP machine, complains of increased sleepiness since 2022, drifting to sleep during daytime, sedation,  She had repeat sleep study in March 2023 by Dr. Virgel Manifold, reported obstructive sleep apnea corrected by CPAP, idiopathic hypersomnia, sleep latency was 2 minutes 50 seconds on any sleep latency test  She also concerned about memory loss, she used to work at jail, complains of nightmares, now has improved after retirement  Echocardiogram in December 2022: Ejection fraction 60  to 65%, no significant valvular pathology  Cardiac catheter in October 2022, mild to moderate nonobstructive coronary artery disease,  Laboratory evaluations in Oct 2022, A1c was 8.9, CMP, glucose 364, creat 0.75,  AST 59, ALT 43, hg 14.8, TSH,  Triglyceride 395,  UPDATE May 18 2023: Is accompanied by her husband at today's clinical visit, recently had cardiac cath placement,  She complains of extreme fatigue, long hours in bed sleeping, during today's sitting in the recliner, watching TV, often nodding to sleep again, not physically active  She complains of worsening memory loss, get up confused what to do next, repeating questions,  MRI lumbar in July 2024: 1. At least moderate multifactorial spinal stenosis at L4-5 secondary to facet and ligamentous hypertrophy, a degenerative grade 1 anterolisthesis and congenital factors. 2. Mild multifactorial spinal stenosis at L3-4. 3. Epidural lipomatosis at L5-S1 with mild mass effect on the thecal sac.   PHYSICAL EXAM:   Vitals:   05/18/23 1341 05/18/23 1344  BP: 120/68 124/71  Pulse: 71 61  Resp: 16   Height: 5\' 4"  (1.626 m)    Not recorded     Body mass index is 38.96 kg/m.  PHYSICAL EXAMNIATION:  Gen: NAD, conversant, well nourised, well groomed                     Cardiovascular: Regular rate rhythm, no peripheral edema, warm, nontender. Eyes: Conjunctivae clear without exudates or hemorrhage Neck: Supple, no carotid bruits. Pulmonary: Clear to auscultation bilaterally   NEUROLOGICAL EXAM:  MENTAL STATUS: Speech:    Speech is normal; fluent and spontaneous with normal comprehension.  Cognition:  11/17/2021    3:18 PM  Montreal Cognitive Assessment   Visuospatial/ Executive (0/5) 4  Naming (0/3) 3  Attention: Read list of digits (0/2) 1  Attention: Read list of letters (0/1) 1  Attention: Serial 7 subtraction starting at 100 (0/3) 3  Language: Repeat phrase (0/2) 2  Language : Fluency (0/1) 0  Abstraction  (0/2) 1  Delayed Recall (0/5) 1  Orientation (0/6) 5  Total 21 21  Adjusted Score (based on education) 22    CRANIAL NERVES: CN II: Visual fields are full to confrontation. Pupils are round equal and briskly reactive to light. CN III, IV, VI: extraocular movement are normal. No ptosis. CN V: Facial sensation is intact to light touch CN VII: Face is symmetric with normal eye closure  CN VIII: Hearing is normal to causal conversation. CN IX, X: Phonation is normal. CN XI: Head turning and shoulder shrug are intact CN XII: Narrow oropharyngeal space  MOTOR: Normal strength, mild bilateral hands tremor, mild shaky when drawing spiral circle  REFLEXES: Reflexes are 2  and symmetric at the biceps, triceps, knees, and absent ankles. Plantar responses are flexor.  SENSORY: Length-dependent decreased light touch pinprick to distal shin level  COORDINATION: There is no trunk or limb dysmetria noted.  GAIT/STANCE: Need push-up to get up from seated position, steady  REVIEW OF SYSTEMS:  Full 14 system review of systems performed and notable only for as above All other review of systems were negative.   ALLERGIES: Allergies  Allergen Reactions   Azithromycin Hives   Codeine Nausea Only   Atomoxetine Nausea And Vomiting   Atorvastatin Other (See Comments)    Restless legs   Lisinopril Nausea And Vomiting    HOME MEDICATIONS: Current Outpatient Medications  Medication Sig Dispense Refill   Accu-Chek Softclix Lancets lancets Use as instructed to test blood glucose four times daily 100 each 2   aspirin EC 81 MG tablet Take 1 tablet (81 mg total) by mouth daily. Swallow whole. Take for 30 days total then stop. Last dose 05/15/23. 28 tablet 0   busPIRone (BUSPAR) 5 MG tablet Take 5 mg by mouth at bedtime.     Cholecalciferol (VITAMIN D3) 125 MCG (5000 UT) CAPS Take 1 capsule (5,000 Units total) by mouth daily. 90 capsule 0   citalopram (CELEXA) 20 MG tablet Take 1 tablet (20 mg total)  by mouth every evening. 30 tablet 1   clopidogrel (PLAVIX) 75 MG tablet Take 1 tablet (75 mg total) by mouth daily. 90 tablet 3   Continuous Blood Gluc Sensor (FREESTYLE LIBRE 2 SENSOR) MISC Change sensor every 14 days. Use to check glucose continuously 2 each 0   Continuous Glucose Receiver (FREESTYLE LIBRE 2 READER) DEVI USE TO CHECK GLUCOSE AS DIRECTED 1 each 0   donepezil (ARICEPT) 5 MG tablet Take 5 mg by mouth every evening.     fenofibrate (TRICOR) 145 MG tablet Take 145 mg by mouth every evening.     gabapentin (NEURONTIN) 100 MG capsule Take 1 capsule (100 mg total) by mouth at bedtime.     glipiZIDE (GLUCOTROL) 5 MG tablet Take 1 tablet (5 mg total) by mouth daily before breakfast. 90 tablet 1   glucose blood test strip 1 each by Other route as needed. Use as instructed to test blood glucose four times daily 100 each 2   indapamide (LOZOL) 2.5 MG tablet Take 1 tablet (2.5 mg total) by mouth daily. 30 tablet 11   insulin aspart (NOVOLOG FLEXPEN) 100  UNIT/ML FlexPen Inject 10-16 Units into the skin 3 (three) times daily before meals. (Patient taking differently: Inject 10-16 Units into the skin 3 (three) times daily before meals. Sliding scale 90-150=10 units 151-200=11 units 201-250=12 units 251-300=13 units 301-400=14 units 400=16 units Over 400 call MD) 15 mL 0   insulin degludec (TRESIBA FLEXTOUCH) 200 UNIT/ML FlexTouch Pen Inject 100 Units into the skin at bedtime. 45 mL 0   Insulin Pen Needle (B-D ULTRAFINE III SHORT PEN) 31G X 8 MM MISC 1 each by Does not apply route as directed. 100 each 2   isosorbide mononitrate (IMDUR) 60 MG 24 hr tablet Take 1 tablet (60 mg total) by mouth daily. Can take an additional half tab as needed for chest pain     levocetirizine (XYZAL) 5 MG tablet Take 5 mg by mouth every evening.     levothyroxine (SYNTHROID) 50 MCG tablet TAKE 1 TABLET BY MOUTH ONCE DAILY BEFORE BREAKFAST 90 tablet 0   metFORMIN (GLUCOPHAGE) 500 MG tablet TAKE 1 TABLET TWICE  DAILY WITH MEALS 180 tablet 1   mirabegron ER (MYRBETRIQ) 25 MG TB24 tablet Take 1 tablet (25 mg total) by mouth daily. 30 tablet 11   modafinil (PROVIGIL) 100 MG tablet Take 1 tablet (100 mg total) by mouth daily. 30 tablet 0   montelukast (SINGULAIR) 10 MG tablet Take 10 mg by mouth at bedtime.     Multiple Vitamin (MULTIVITAMIN ADULT PO) Take 1 tablet by mouth daily.     nitroGLYCERIN (NITROSTAT) 0.4 MG SL tablet Place 1 tablet (0.4 mg total) under the tongue every 5 (five) minutes as needed for chest pain. 25 tablet 3   OVER THE COUNTER MEDICATION Take 2 capsules by mouth daily. Omega XL     pantoprazole (PROTONIX) 40 MG tablet Take 2 tablets (80 mg total) by mouth daily. 60 tablet 1   rosuvastatin (CRESTOR) 40 MG tablet Take 1 tablet (40 mg total) by mouth daily. 30 tablet 5   warfarin (COUMADIN) 5 MG tablet Take 1/2 a tablet to 1 tablet by mouth daily as directed by the coumadin clinic. (Patient taking differently: Take 2.5-5 mg by mouth See admin instructions. Take 2.5 mg Mon., Wed., Fri., and Sat. Take 5 mg Sun., Tues., and Thurs.  as directed by the coumadin clinic.) 90 tablet 0   No current facility-administered medications for this visit.    PAST MEDICAL HISTORY: Past Medical History:  Diagnosis Date   ADD (attention deficit disorder)    Diabetes mellitus without complication (HCC)    Hypertension     PAST SURGICAL HISTORY: Past Surgical History:  Procedure Laterality Date   ABDOMINAL HYSTERECTOMY     COLONOSCOPY  02/2022   CORONARY PRESSURE/FFR STUDY N/A 04/16/2023   Procedure: CORONARY PRESSURE/FFR STUDY;  Surgeon: Swaziland, Peter M, MD;  Location: [facility] INVASIVE CV LAB;  Service: Cardiovascular;  Laterality: N/A;   CORONARY STENT INTERVENTION N/A 04/16/2023   Procedure: CORONARY STENT INTERVENTION;  Surgeon: Swaziland, Peter M, MD;  Location: [facility] INVASIVE CV LAB;  Service: Cardiovascular;  Laterality: N/A;   CORONARY ULTRASOUND/IVUS N/A 04/16/2023   Procedure: Coronary  Ultrasound/IVUS;  Surgeon: Swaziland, Peter M, MD;  Location: [facility] INVASIVE CV LAB;  Service: Cardiovascular;  Laterality: N/A;   EXCISIONAL HEMORRHOIDECTOMY     INCONTINENCE SURGERY     RIGHT/LEFT HEART CATH AND CORONARY ANGIOGRAPHY N/A 06/13/2021   Procedure: RIGHT/LEFT HEART CATH AND CORONARY ANGIOGRAPHY;  Surgeon: Swaziland, Peter M, MD;  Location: [facility] INVASIVE CV LAB;  Service: Cardiovascular;  Laterality:  N/A;   RIGHT/LEFT HEART CATH AND CORONARY ANGIOGRAPHY N/A 04/16/2023   Procedure: RIGHT/LEFT HEART CATH AND CORONARY ANGIOGRAPHY;  Surgeon: Swaziland, Peter M, MD;  Location: [facility] INVASIVE CV LAB;  Service: Cardiovascular;  Laterality: N/A;    FAMILY HISTORY: Family History  Problem Relation Age of Onset   Cancer Mother    Diabetes Father    Heart Problems Father    COPD Father    Emphysema Father    Heart block Father    Congestive Heart Failure Father     SOCIAL HISTORY: Social History   Socioeconomic History   Marital status: Married    Spouse name: Shon Hale   Number of children: Not on file   Years of education: Not on file   Highest education level: Not on file  Occupational History   Not on file  Tobacco Use   Smoking status: Former    Current packs/day: 0.00    Types: Cigarettes    Quit date: 08/17/1994    Years since quitting: 28.7   Smokeless tobacco: Never  Vaping Use   Vaping status: Never Used  Substance and Sexual Activity   Alcohol use: No   Drug use: No   Sexual activity: Yes  Other Topics Concern   Not on file  Social History Narrative   Lives at home with husband, Shon Hale   Former smoker Smoked 2 ppd x 25 yrs; Quit in 1996   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, retail  retired Secondary school teacher, still works part-time at the [employer], used to work Editor, commissioning   Now on fixed income from her SSI and pension   Social Determinants of Health   Financial Resource Strain: Low Risk  (10/10/2021)   Overall Financial Resource Strain  (CARDIA)    Difficulty of Paying Living Expenses: Not hard at all  Food Insecurity: No Food Insecurity (04/16/2023)   Hunger Vital Sign    Worried About Running Out of Food in the Last Year: Never true    Ran Out of Food in the Last Year: Never true  Transportation Needs: No Transportation Needs (04/16/2023)   PRAPARE - Administrator, Civil Service (Medical): No    Lack of Transportation (Non-Medical): No  Physical Activity: Inactive (04/08/2023)   Received from [facility]   Exercise Vital Sign    Days of Exercise per Week: 0 days    Minutes of Exercise per Session: 0 min  Stress: No Stress Concern Present (10/10/2021)   [employer] of Occupational Health - Occupational Stress Questionnaire    Feeling of Stress : Only a little  Social Connections: Socially Integrated (10/10/2021)   Social Connection and Isolation Panel [NHANES]    Frequency of Communication with Friends and Family: More than three times a week    Frequency of Social Gatherings with Friends and Family: More than three times a week    Attends Religious Services: More than 4 times per year    Active Member of [employer] or Organizations: Yes    Attends Engineer, structural: More than 4 times per year    Marital Status: Married  Catering manager Violence: Not At Risk (04/16/2023)   Humiliation, Afraid, Rape, and Kick questionnaire    Fear of Current or Ex-Partner: No    Emotionally Abused: No    Physically Abused: No    Sexually Abused: No      Levert Feinstein, M.D. Ph.D.  [facility] [address] [place], [place] [address] Ph: [phone] Fax: [phone]  CC:  Terrilee Files, MD [address] [place],  [place] [address]  Royann Shivers, [place]

## 2023-05-19 ENCOUNTER — Encounter (HOSPITAL_COMMUNITY)
Admission: RE | Admit: 2023-05-19 | Discharge: 2023-05-19 | Disposition: A | Discharge status: Home / Self Care | Payer: Medicare HMO | Source: Ambulatory Visit | Attending: Cardiology | Admitting: Cardiology

## 2023-05-19 VITALS — BP 138/72 | HR 60 | Ht 64.0 in | Wt 228.8 lb

## 2023-05-19 DIAGNOSIS — Z955 Presence of coronary angioplasty implant and graft: Secondary | ICD-10-CM | POA: Diagnosis not present

## 2023-05-19 NOTE — Progress Notes (Signed)
Cardiac Individual Treatment Plan  Patient Details  Name: Rachel Odonnell MRN: [id number] Date of Birth: March 31, 1956 Referring Provider:   Flowsheet Row CARDIAC REHAB PHASE II ORIENTATION from 05/19/2023 in [place] [facility]  Referring Provider [place], Peter MD       Initial Encounter Date:  Flowsheet Row CARDIAC REHAB PHASE II ORIENTATION from 05/19/2023 in [place] [facility]  Date 05/19/23       Visit Diagnosis: Status post primary angioplasty with coronary stent  Patient's Home Medications on Admission:  Current Outpatient Medications:    Accu-Chek Softclix Lancets lancets, Use as instructed to test blood glucose four times daily, Disp: 100 each, Rfl: 2   aspirin EC 81 MG tablet, Take 1 tablet (81 mg total) by mouth daily. Swallow whole. Take for 30 days total then stop. Last dose 05/15/23., Disp: 28 tablet, Rfl: 0   busPIRone (BUSPAR) 5 MG tablet, Take 5 mg by mouth at bedtime., Disp: , Rfl:    Cholecalciferol (VITAMIN D3) 125 MCG (5000 UT) CAPS, Take 1 capsule (5,000 Units total) by mouth daily., Disp: 90 capsule, Rfl: 0   citalopram (CELEXA) 20 MG tablet, Take 1 tablet (20 mg total) by mouth every evening., Disp: 30 tablet, Rfl: 1   clopidogrel (PLAVIX) 75 MG tablet, Take 1 tablet (75 mg total) by mouth daily., Disp: 90 tablet, Rfl: 3   Continuous Blood Gluc Sensor (FREESTYLE LIBRE 2 SENSOR) MISC, Change sensor every 14 days. Use to check glucose continuously, Disp: 2 each, Rfl: 0   Continuous Glucose Receiver (FREESTYLE LIBRE 2 READER) DEVI, USE TO CHECK GLUCOSE AS DIRECTED, Disp: 1 each, Rfl: 0   donepezil (ARICEPT) 5 MG tablet, Take 5 mg by mouth every evening., Disp: , Rfl:    fenofibrate (TRICOR) 145 MG tablet, Take 145 mg by mouth every evening., Disp: , Rfl:    gabapentin (NEURONTIN) 100 MG capsule, Take 1 capsule (100 mg total) by mouth at bedtime., Disp: , Rfl:    glipiZIDE (GLUCOTROL) 5 MG tablet, Take 1 tablet (5 mg total) by mouth daily  before breakfast., Disp: 90 tablet, Rfl: 1   glucose blood test strip, 1 each by Other route as needed. Use as instructed to test blood glucose four times daily, Disp: 100 each, Rfl: 2   indapamide (LOZOL) 2.5 MG tablet, Take 1 tablet (2.5 mg total) by mouth daily., Disp: 30 tablet, Rfl: 11   insulin aspart (NOVOLOG FLEXPEN) 100 UNIT/ML FlexPen, Inject 10-16 Units into the skin 3 (three) times daily before meals. (Patient taking differently: Inject 10-16 Units into the skin 3 (three) times daily before meals. Sliding scale 90-150=10 units 151-200=11 units 201-250=12 units 251-300=13 units 301-400=14 units 400=16 units Over 400 call MD), Disp: 15 mL, Rfl: 0   insulin degludec (TRESIBA FLEXTOUCH) 200 UNIT/ML FlexTouch Pen, Inject 100 Units into the skin at bedtime., Disp: 45 mL, Rfl: 0   Insulin Pen Needle (B-D ULTRAFINE III SHORT PEN) 31G X 8 MM MISC, 1 each by Does not apply route as directed., Disp: 100 each, Rfl: 2   isosorbide mononitrate (IMDUR) 60 MG 24 hr tablet, Take 1 tablet (60 mg total) by mouth daily. Can take an additional half tab as needed for chest pain, Disp: , Rfl:    levocetirizine (XYZAL) 5 MG tablet, Take 5 mg by mouth every evening., Disp: , Rfl:    levothyroxine (SYNTHROID) 50 MCG tablet, TAKE 1 TABLET BY MOUTH ONCE DAILY BEFORE BREAKFAST, Disp: 90 tablet, Rfl: 0   metFORMIN (GLUCOPHAGE) 500  MG tablet, TAKE 1 TABLET TWICE DAILY WITH MEALS, Disp: 180 tablet, Rfl: 1   mirabegron ER (MYRBETRIQ) 25 MG TB24 tablet, Take 1 tablet (25 mg total) by mouth daily., Disp: 30 tablet, Rfl: 11   modafinil (PROVIGIL) 100 MG tablet, Take 1 tablet (100 mg total) by mouth daily., Disp: 30 tablet, Rfl: 0   montelukast (SINGULAIR) 10 MG tablet, Take 10 mg by mouth at bedtime., Disp: , Rfl:    Multiple Vitamin (MULTIVITAMIN ADULT PO), Take 1 tablet by mouth daily., Disp: , Rfl:    nitroGLYCERIN (NITROSTAT) 0.4 MG SL tablet, Place 1 tablet (0.4 mg total) under the tongue every 5 (five) minutes as needed  for chest pain., Disp: 25 tablet, Rfl: 3   OVER THE COUNTER MEDICATION, Take 2 capsules by mouth daily. Omega XL, Disp: , Rfl:    pantoprazole (PROTONIX) 40 MG tablet, Take 2 tablets (80 mg total) by mouth daily., Disp: 60 tablet, Rfl: 1   rosuvastatin (CRESTOR) 40 MG tablet, Take 1 tablet (40 mg total) by mouth daily., Disp: 30 tablet, Rfl: 5   warfarin (COUMADIN) 5 MG tablet, Take 1/2 a tablet to 1 tablet by mouth daily as directed by the coumadin clinic. (Patient taking differently: Take 2.5-5 mg by mouth See admin instructions. Take 2.5 mg Mon., Wed., Fri., and Sat. Take 5 mg Sun., Tues., and Thurs.  as directed by the coumadin clinic.), Disp: 90 tablet, Rfl: 0  Past Medical History: Past Medical History:  Diagnosis Date   ADD (attention deficit disorder)    Diabetes mellitus without complication (HCC)    Hypertension     Tobacco Use: Social History   Tobacco Use  Smoking Status Former   Current packs/day: 0.00   Types: Cigarettes   Quit date: 08/17/1994   Years since quitting: 28.7  Smokeless Tobacco Never    Labs: Review Flowsheet  More data exists      Latest Ref Rng & Units 09/01/2022 12/01/2022 02/19/2023 03/04/2023 04/16/2023  Labs for ITP Cardiac and Pulmonary Rehab  Cholestrol 100 - 199 mg/dL - - 161  - -  LDL (calc) 0 - 99 mg/dL - - 91  - -  HDL-C >09 mg/dL - - 43  - -  Trlycerides 0 - 149 mg/dL - - 604  - -  Hemoglobin A1c 4.8 - 5.6 % 9.9  10.4  - 10.2  11.4   PH, Arterial 7.35 - 7.45 - - - - 7.375   PCO2 arterial 32 - 48 mmHg - - - - 41.9   Bicarbonate 20.0 - 28.0 mmol/L - - - - 27.1  27.2  24.6   TCO2 22 - 32 mmol/L - - - - 29  29  26    Acid-base deficit 0.0 - 2.0 mmol/L - - - - 1.0   O2 Saturation % - - - - 62  63  97     Details       Multiple values from one day are sorted in reverse-chronological order         Capillary Blood Glucose: Lab Results  Component Value Date   GLUCAP 304 (H) 04/17/2023   GLUCAP 292 (H) 04/17/2023   GLUCAP 286 (H)  04/16/2023   GLUCAP 297 (H) 04/16/2023   GLUCAP 388 (H) 04/16/2023     Exercise Target Goals: Exercise Program Goal: Individual exercise prescription set using results from initial 6 min walk test and THRR while considering  patient's activity barriers and safety.   Exercise Prescription Goal: Starting  with aerobic activity 30 plus minutes a day, 3 days per week for initial exercise prescription. Provide home exercise prescription and guidelines that participant acknowledges understanding prior to discharge.  Activity Barriers & Risk Stratification:  Activity Barriers & Cardiac Risk Stratification - 05/12/23 1359       Activity Barriers & Cardiac Risk Stratification   Activity Barriers Shortness of Breath;Arthritis;Back Problems;Chest Pain/Angina;Joint Problems;Balance Concerns;History of Falls;Deconditioning;Muscular Weakness    Cardiac Risk Stratification Moderate             6 Minute Walk:  6 Minute Walk     Row Name 05/19/23 1600         6 Minute Walk   Phase Initial     Distance 950 feet     Walk Time 6 minutes     # of Rest Breaks 0     MPH 1.79     METS 1.73     RPE 13     Perceived Dyspnea  1     VO2 Peak 6.08     Symptoms Yes (comment)     Comments BL hip pain 3/10     Resting HR 60 bpm     Resting BP 138/52     Resting Oxygen Saturation  95 %     Exercise Oxygen Saturation  during 6 min walk 92 %     Max Ex. HR 80 bpm     Max Ex. BP 156/50     2 Minute Post BP 136/52              Oxygen Initial Assessment:   Oxygen Re-Evaluation:   Oxygen Discharge (Final Oxygen Re-Evaluation):   Initial Exercise Prescription:  Initial Exercise Prescription - 05/19/23 1600       Date of Initial Exercise RX and Referring Provider   Date 05/19/23    Referring Provider [place], Peter MD      Oxygen   Maintain Oxygen Saturation 88% or higher      Treadmill   MPH 1.3    Grade 0    Minutes 15    METs 1.7      NuStep   Level 1    SPM 80     Minutes 15    METs 1.5      REL-XR   Level 1    Speed 50    Minutes 15    METs 1.5      Prescription Details   Frequency (times per week) 3    Duration Progress to 30 minutes of continuous aerobic without signs/symptoms of physical distress      Intensity   THRR 40-80% of Max Heartrate 97-134    Ratings of Perceived Exertion 11-13    Perceived Dyspnea 0-4      Resistance Training   Training Prescription Yes    Weight 2 lbs    Reps 10-15             Perform Capillary Blood Glucose checks as needed.  Exercise Prescription Changes:   Exercise Prescription Changes     Row Name 05/19/23 1600             Response to Exercise   Blood Pressure (Admit) 138/52       Blood Pressure (Exercise) 156/50       Blood Pressure (Exit) 136/52       Heart Rate (Admit) 60 bpm       Heart Rate (Exercise) 80 bpm       Heart  Rate (Exit) 70 bpm       Oxygen Saturation (Admit) 95 %       Oxygen Saturation (Exercise) 92 %       Oxygen Saturation (Exit) 97 %       Rating of Perceived Exertion (Exercise) 13       Perceived Dyspnea (Exercise) 1       Duration Continue with 30 min of aerobic exercise without signs/symptoms of physical distress.       Intensity THRR unchanged         Progression   Progression Continue to progress workloads to maintain intensity without signs/symptoms of physical distress.                Exercise Comments:   Exercise Goals and Review:   Exercise Goals     Row Name 05/19/23 1606             Exercise Goals   Increase Physical Activity Yes       Intervention Provide advice, education, support and counseling about physical activity/exercise needs.;Develop an individualized exercise prescription for aerobic and resistive training based on initial evaluation findings, risk stratification, comorbidities and participant's personal goals.       Expected Outcomes Short Term: Attend rehab on a regular basis to increase amount of physical  activity.;Long Term: Add in home exercise to make exercise part of routine and to increase amount of physical activity.;Long Term: Exercising regularly at least 3-5 days a week.       Increase Strength and Stamina Yes       Intervention Provide advice, education, support and counseling about physical activity/exercise needs.;Develop an individualized exercise prescription for aerobic and resistive training based on initial evaluation findings, risk stratification, comorbidities and participant's personal goals.       Expected Outcomes Short Term: Increase workloads from initial exercise prescription for resistance, speed, and METs.;Long Term: Improve cardiorespiratory fitness, muscular endurance and strength as measured by increased METs and functional capacity ( );Short Term: Perform resistance training exercises routinely during rehab and add in resistance training at home       Able to understand and use rate of perceived exertion (RPE) scale Yes       Intervention Provide education and explanation on how to use RPE scale       Expected Outcomes Short Term: Able to use RPE daily in rehab to express subjective intensity level;Long Term:  Able to use RPE to guide intensity level when exercising independently       Able to understand and use Dyspnea scale Yes       Intervention Provide education and explanation on how to use Dyspnea scale       Expected Outcomes Short Term: Able to use Dyspnea scale daily in rehab to express subjective sense of shortness of breath during exertion;Long Term: Able to use Dyspnea scale to guide intensity level when exercising independently       Knowledge and understanding of Target Heart Rate Range (THRR) Yes       Intervention Provide education and explanation of THRR including how the numbers were predicted and where they are located for reference       Expected Outcomes Short Term: Able to state/look up THRR;Long Term: Able to use THRR to govern intensity when  exercising independently;Short Term: Able to use daily as guideline for intensity in rehab       Able to check pulse independently Yes       Intervention Review the importance  of being able to check your own pulse for safety during independent exercise;Provide education and demonstration on how to check pulse in carotid and radial arteries.       Expected Outcomes Short Term: Able to explain why pulse checking is important during independent exercise;Long Term: Able to check pulse independently and accurately       Understanding of Exercise Prescription Yes       Intervention Provide education, explanation, and written materials on patient's individual exercise prescription       Expected Outcomes Short Term: Able to explain program exercise prescription;Long Term: Able to explain home exercise prescription to exercise independently                Exercise Goals Re-Evaluation :    Discharge Exercise Prescription (Final Exercise Prescription Changes):  Exercise Prescription Changes - 05/19/23 1600       Response to Exercise   Blood Pressure (Admit) 138/52    Blood Pressure (Exercise) 156/50    Blood Pressure (Exit) 136/52    Heart Rate (Admit) 60 bpm    Heart Rate (Exercise) 80 bpm    Heart Rate (Exit) 70 bpm    Oxygen Saturation (Admit) 95 %    Oxygen Saturation (Exercise) 92 %    Oxygen Saturation (Exit) 97 %    Rating of Perceived Exertion (Exercise) 13    Perceived Dyspnea (Exercise) 1    Duration Continue with 30 min of aerobic exercise without signs/symptoms of physical distress.    Intensity THRR unchanged      Progression   Progression Continue to progress workloads to maintain intensity without signs/symptoms of physical distress.             Nutrition:  Target Goals: Understanding of nutrition guidelines, daily intake of sodium 1500mg , cholesterol 200mg , calories 30% from fat and 7% or less from saturated fats, daily to have 5 or more servings of fruits and  vegetables.  Biometrics:  Pre Biometrics - 05/19/23 1606       Pre Biometrics   Height 5\' 4"  (1.626 m)    Weight 103.8 kg    Waist Circumference 46 inches    Hip Circumference 51 inches    Waist to Hip Ratio 0.9 %    BMI (Calculated) 39.26    Grip Strength 22.6 kg    Single Leg Stand 4.63 seconds              Nutrition Therapy Plan and Nutrition Goals:  Nutrition Therapy & Goals - 05/12/23 1410       Personal Nutrition Goals   Comments The pt eats 2 meals a day and sometimes eats snacks in between.  She tries to eat healthy but gets confused about what she can eat.  The pt eats chicken and fish, but she does eat red meat at times.  She tries to eat mainly grilled meat vs fried.      Intervention Plan   Intervention Nutrition handout(s) given to patient.    Expected Outcomes Short Term Goal: Understand basic principles of dietary content, such as calories, fat, sodium, cholesterol and nutrients.;Long Term Goal: Adherence to prescribed nutrition plan.             Nutrition Assessments:  MEDIFICTS Score Key: >=70 Need to make dietary changes  40-70 Heart Healthy Diet <= 40 Therapeutic Level Cholesterol Diet  Flowsheet Row CARDIAC REHAB PHASE II ORIENTATION from 05/19/2023 in [facility]  Picture Your Plate Total Score on Admission 44  Picture Your Plate Scores: <16 Unhealthy dietary pattern with much room for improvement. 41-50 Dietary pattern unlikely to meet recommendations for good health and room for improvement. 51-60 More healthful dietary pattern, with some room for improvement.  >60 Healthy dietary pattern, although there may be some specific behaviors that could be improved.    Nutrition Goals Re-Evaluation:   Nutrition Goals Discharge (Final Nutrition Goals Re-Evaluation):   Psychosocial: Target Goals: Acknowledge presence or absence of significant depression and/or stress, maximize coping skills, provide positive  support system. Participant is able to verbalize types and ability to use techniques and skills needed for reducing stress and depression.  Initial Review & Psychosocial Screening:  Initial Psych Review & Screening - 05/12/23 1417       Initial Review   Current issues with History of Depression;Current Psychotropic Meds;Current Sleep Concerns      Family Dynamics   Good Support System? Yes    Comments The pt's husband and her sisters are her main support system.      Barriers   Psychosocial barriers to participate in program There are no identifiable barriers or psychosocial needs.      Screening Interventions   Interventions Encouraged to exercise;Provide feedback about the scores to participant;To provide support and resources with identified psychosocial needs    Expected Outcomes Long Term Goal: Stressors or current issues are controlled or eliminated.;Short Term goal: Identification and review with participant of any Quality of Life or Depression concerns found by scoring the questionnaire.;Long Term goal: The participant improves quality of Life and PHQ9 Scores as seen by post scores and/or verbalization of changes             Quality of Life Scores:  Quality of Life - 05/19/23 1317       Quality of Life Scores   Health/Function Pre 14.43 %    Socioeconomic Pre 26.44 %    Psych/Spiritual Pre 28.29 %    Family Pre 30 %    GLOBAL Pre 22.17 %            Scores of 19 and below usually indicate a poorer quality of life in these areas.  A difference of  2-3 points is a clinically meaningful difference.  A difference of 2-3 points in the total score of the Quality of Life Index has been associated with significant improvement in overall quality of life, self-image, physical symptoms, and general health in studies assessing change in quality of life.  PHQ-9: Review Flowsheet  More data may exist      05/19/2023 10/10/2021 07/30/2021 04/18/2021 03/28/2021  Depression screen  PHQ 2/9  Decreased Interest 0 0 0 1 1  Down, Depressed, Hopeless 0 1 1 1 1   PHQ - 2 Score 0 1 1 2 2   Altered sleeping 3 - - - 1  Tired, decreased energy 2 - - - 1  Change in appetite 0 - - - 1  Feeling bad or failure about yourself  0 - - - 0  Trouble concentrating 0 - - - 0  Moving slowly or fidgety/restless 0 - - - 0  Suicidal thoughts 0 - - - 0  PHQ-9 Score 5 - - - 5  Difficult doing work/chores Not difficult at all - - - -    Details           Interpretation of Total Score  Total Score Depression Severity:  1-4 = Minimal depression, 5-9 = Mild depression, 10-14 = Moderate depression, 15-19 = Moderately severe  depression, 20-27 = Severe depression   Psychosocial Evaluation and Intervention:  Psychosocial Evaluation - 05/12/23 1420       Psychosocial Evaluation & Interventions   Interventions Encouraged to exercise with the program and follow exercise prescription;Stress management education;Relaxation education    Comments Pt was referred to cardiac rehab since she had primary angioplasty with a coronary stent.  She denies anxiety/depression or suicidal thoughts, and she states that she did have a history of depression but was referred to a psyschiatrist at [facility] in [place].  She states that talking to the psychiatrist made her feel like a "weight was lifted."  She takes Buspar and  Celexa, as well Modafinil to keep her awake during the day.  The pt states that if she does not take her Modafinil in the morning, then she will sleep for a large portion of the day.  The fatigue and sleepiness make it hard for her to get motivivated to exercise, and she has jaw pain if she pushes herself too hard with exercise (she stated MD is aware).  She has a good support system in her husband and sisters, and they encourage her to get active at home.  Her goals for the program are to increase her strength/endurance so that she will feel better, and she wants to lose weight and be able to walk with  less shortness of breath.    Continue Psychosocial Services  Follow up required by staff             Psychosocial Re-Evaluation:   Psychosocial Discharge (Final Psychosocial Re-Evaluation):   Vocational Rehabilitation: Provide vocational rehab assistance to qualifying candidates.   Vocational Rehab Evaluation & Intervention:  Vocational Rehab - 05/12/23 1416       Vocational Rehab Re-Evaulation   Comments Pt is retired and has no plans to return to work.             Education: Education Goals: Education classes will be provided on a weekly basis, covering required topics. Participant will state understanding/return demonstration of topics presented.  Learning Barriers/Preferences:  Learning Barriers/Preferences - 05/12/23 1414       Learning Barriers/Preferences   Learning Barriers Exercise Concerns   memory impairment per pt   Learning Preferences Verbal Instruction;Skilled Demonstration;Video             Education Topics: Hypertension, Hypertension Reduction -Define heart disease and high blood pressure. Discus how high blood pressure affects the body and ways to reduce high blood pressure.   Exercise and Your Heart -Discuss why it is important to exercise, the FITT principles of exercise, normal and abnormal responses to exercise, and how to exercise safely.   Angina -Discuss definition of angina, causes of angina, treatment of angina, and how to decrease risk of having angina.   Cardiac Medications -Review what the following cardiac medications are used for, how they affect the body, and side effects that may occur when taking the medications.  Medications include Aspirin, Beta blockers, calcium channel blockers, ACE Inhibitors, angiotensin receptor blockers, diuretics, digoxin, and antihyperlipidemics.   Congestive Heart Failure -Discuss the definition of CHF, how to live with CHF, the signs and symptoms of CHF, and how keep track of weight and  sodium intake.   Heart Disease and Intimacy -Discus the effect sexual activity has on the heart, how changes occur during intimacy as we age, and safety during sexual activity.   Smoking Cessation / COPD -Discuss different methods to quit smoking, the health benefits of quitting smoking,  and the definition of COPD.   Nutrition I: Fats -Discuss the types of cholesterol, what cholesterol does to the heart, and how cholesterol levels can be controlled.   Nutrition II: Labels -Discuss the different components of food labels and how to read food label   Heart Parts/Heart Disease and PAD -Discuss the anatomy of the heart, the pathway of blood circulation through the heart, and these are affected by heart disease.   Stress I: Signs and Symptoms -Discuss the causes of stress, how stress may lead to anxiety and depression, and ways to limit stress.   Stress II: Relaxation -Discuss different types of relaxation techniques to limit stress.   Warning Signs of Stroke / TIA -Discuss definition of a stroke, what the signs and symptoms are of a stroke, and how to identify when someone is having stroke.   Knowledge Questionnaire Score:  Knowledge Questionnaire Score - 05/12/23 1449       Knowledge Questionnaire Score   Pre Score 21             Core Components/Risk Factors/Patient Goals at Admission:  Personal Goals and Risk Factors at Admission - 05/12/23 1421       Core Components/Risk Factors/Patient Goals on Admission    Weight Management Yes;Weight Loss;Obesity    Intervention Weight Management/Obesity: Establish reasonable short term and long term weight goals.;Obesity: Provide education and appropriate resources to help participant work on and attain dietary goals.    Expected Outcomes Short Term: Continue to assess and modify interventions until short term weight is achieved;Long Term: Adherence to nutrition and physical activity/exercise program aimed toward attainment  of established weight goal;Weight Loss: Understanding of general recommendations for a balanced deficit meal plan, which promotes 1-2 lb weight loss per week and includes a negative energy balance of [phone] kcal/d;Understanding recommendations for meals to include 15-35% energy as protein, 25-35% energy from fat, 35-60% energy from carbohydrates, less than 200mg  of dietary cholesterol, 20-35 gm of total fiber daily    Improve shortness of breath with ADL's Yes    Intervention Provide education, individualized exercise plan and daily activity instruction to help decrease symptoms of SOB with activities of daily living.    Expected Outcomes Short Term: Improve cardiorespiratory fitness to achieve a reduction of symptoms when performing ADLs;Long Term: Be able to perform more ADLs without symptoms or delay the onset of symptoms    Diabetes Yes    Intervention Provide education about signs/symptoms and action to take for hypo/hyperglycemia.;Provide education about proper nutrition, including hydration, and aerobic/resistive exercise prescription along with prescribed medications to achieve blood glucose in normal ranges: Fasting glucose 65-99 mg/dL    Expected Outcomes Short Term: Participant verbalizes understanding of the signs/symptoms and immediate care of hyper/hypoglycemia, proper foot care and importance of medication, aerobic/resistive exercise and nutrition plan for blood glucose control.;Long Term: Attainment of HbA1C < 7%.    Hypertension Yes    Intervention Provide education on lifestyle modifcations including regular physical activity/exercise, weight management, moderate sodium restriction and increased consumption of fresh fruit, vegetables, and low fat dairy, alcohol moderation, and smoking cessation.;Monitor prescription use compliance.    Expected Outcomes Short Term: Continued assessment and intervention until BP is < 140/71mm HG in hypertensive participants. < 130/67mm HG in hypertensive  participants with diabetes, heart failure or chronic kidney disease.;Long Term: Maintenance of blood pressure at goal levels.    Lipids Yes    Intervention Provide education and support for participant on nutrition & aerobic/resistive exercise along with prescribed medications  to achieve LDL 70mg , HDL >40mg .    Expected Outcomes Short Term: Participant states understanding of desired cholesterol values and is compliant with medications prescribed. Participant is following exercise prescription and nutrition guidelines.;Long Term: Cholesterol controlled with medications as prescribed, with individualized exercise RX and with personalized nutrition plan. Value goals: LDL < 70mg , HDL > 40 mg.    Personal Goal Other Yes    Personal Goal The pt wants to get "back to normal."  The pt wants to lose weight and be able to walk without getting so short of breath.  She wants to increase her strength/endurance to feel better.    Intervention Pt will attend CR 3 times a week and start a home exercise routine.    Expected Outcomes Pt will meet both her personal and program goals.             Core Components/Risk Factors/Patient Goals Review:    Core Components/Risk Factors/Patient Goals at Discharge (Final Review):    ITP Comments:   Comments: Patient arrived for 1st visit/orientation/education at 1400. Patient was referred to CR by Dr. Peter [place] due to S/P primary angioplasty with stent placement. During orientation advised patient on arrival and appointment times what to wear, what to do before, during and after exercise. Reviewed attendance and class policy.  Pt is scheduled to return Cardiac Rehab on 05/24/23 at 1430. Pt was advised to come to class 15 minutes before class starts.  Discussed RPE/Dpysnea scales. Patient participated in warm up stretches. Patient was able to complete 6 minute walk test.  Telemetry:NSR. Patient was measured for the equipment. Discussed equipment safety with patient.  Took patient pre-anthropometric measurements. Patient finished visit at 1315.

## 2023-05-19 NOTE — Patient Instructions (Addendum)
Patient Instructions  Patient Details  Name: Rachel Odonnell MRN: [id number] Date of Birth: 1956/05/10 Referring Provider:  [place], Peter M, MD  Below are your personal goals for exercise, nutrition, and risk factors. Our goal is to help you stay on track towards obtaining and maintaining these goals. We will be discussing your progress on these goals with you throughout the program.  Initial Exercise Prescription:  Initial Exercise Prescription - 05/19/23 1600       Date of Initial Exercise RX and Referring Provider   Date 05/19/23    Referring Provider [place], Peter MD      Oxygen   Maintain Oxygen Saturation 88% or higher      Treadmill   MPH 1.3    Grade 0    Minutes 15    METs 1.7      NuStep   Level 1    SPM 80    Minutes 15    METs 1.5      REL-XR   Level 1    Speed 50    Minutes 15    METs 1.5      Prescription Details   Frequency (times per week) 3    Duration Progress to 30 minutes of continuous aerobic without signs/symptoms of physical distress      Intensity   THRR 40-80% of Max Heartrate 97-134    Ratings of Perceived Exertion 11-13    Perceived Dyspnea 0-4      Resistance Training   Training Prescription Yes    Weight 2 lbs    Reps 10-15             Exercise Goals: Frequency: Be able to perform aerobic exercise two to three times per week in program working toward 2-5 days per week of home exercise.  Intensity: Work with a perceived exertion of 11 (fairly light) - 15 (hard) while following your exercise prescription.  We will make changes to your prescription with you as you progress through the program.   Duration: Be able to do 30 to 45 minutes of continuous aerobic exercise in addition to a 5 minute warm-up and a 5 minute cool-down routine.   Nutrition Goals: Your personal nutrition goals will be established when you do your nutrition analysis with the dietician.  The following are general nutrition guidelines to follow: Cholesterol  < 200mg /day Sodium < 1500mg /day Fiber: Women over 50 yrs - 21 grams per day   Personal Goals:  Personal Goals and Risk Factors at Admission - 05/12/23 1421       Core Components/Risk Factors/Patient Goals on Admission    Weight Management Yes;Weight Loss;Obesity    Intervention Weight Management/Obesity: Establish reasonable short term and long term weight goals.;Obesity: Provide education and appropriate resources to help participant work on and attain dietary goals.    Expected Outcomes Short Term: Continue to assess and modify interventions until short term weight is achieved;Long Term: Adherence to nutrition and physical activity/exercise program aimed toward attainment of established weight goal;Weight Loss: Understanding of general recommendations for a balanced deficit meal plan, which promotes 1-2 lb weight loss per week and includes a negative energy balance of [phone] kcal/d;Understanding recommendations for meals to include 15-35% energy as protein, 25-35% energy from fat, 35-60% energy from carbohydrates, less than 200mg  of dietary cholesterol, 20-35 gm of total fiber daily    Improve shortness of breath with ADL's Yes    Intervention Provide education, individualized exercise plan and daily activity instruction to help decrease symptoms of  SOB with activities of daily living.    Expected Outcomes Short Term: Improve cardiorespiratory fitness to achieve a reduction of symptoms when performing ADLs;Long Term: Be able to perform more ADLs without symptoms or delay the onset of symptoms    Diabetes Yes    Intervention Provide education about signs/symptoms and action to take for hypo/hyperglycemia.;Provide education about proper nutrition, including hydration, and aerobic/resistive exercise prescription along with prescribed medications to achieve blood glucose in normal ranges: Fasting glucose 65-99 mg/dL    Expected Outcomes Short Term: Participant verbalizes understanding of the  signs/symptoms and immediate care of hyper/hypoglycemia, proper foot care and importance of medication, aerobic/resistive exercise and nutrition plan for blood glucose control.;Long Term: Attainment of HbA1C < 7%.    Hypertension Yes    Intervention Provide education on lifestyle modifcations including regular physical activity/exercise, weight management, moderate sodium restriction and increased consumption of fresh fruit, vegetables, and low fat dairy, alcohol moderation, and smoking cessation.;Monitor prescription use compliance.    Expected Outcomes Short Term: Continued assessment and intervention until BP is < 140/59mm HG in hypertensive participants. < 130/1mm HG in hypertensive participants with diabetes, heart failure or chronic kidney disease.;Long Term: Maintenance of blood pressure at goal levels.    Lipids Yes    Intervention Provide education and support for participant on nutrition & aerobic/resistive exercise along with prescribed medications to achieve LDL 70mg , HDL >40mg .    Expected Outcomes Short Term: Participant states understanding of desired cholesterol values and is compliant with medications prescribed. Participant is following exercise prescription and nutrition guidelines.;Long Term: Cholesterol controlled with medications as prescribed, with individualized exercise RX and with personalized nutrition plan. Value goals: LDL < 70mg , HDL > 40 mg.    Personal Goal Other Yes    Personal Goal The pt wants to get "back to normal."  The pt wants to lose weight and be able to walk without getting so short of breath.  She wants to increase her strength/endurance to feel better.    Intervention Pt will attend CR 3 times a week and start a home exercise routine.    Expected Outcomes Pt will meet both her personal and program goals.             Tobacco Use Initial Evaluation: Social History   Tobacco Use  Smoking Status Former   Current packs/day: 0.00   Types: Cigarettes    Quit date: 08/17/1994   Years since quitting: 28.7  Smokeless Tobacco Never    Exercise Goals and Review:  Exercise Goals     Row Name 05/19/23 1606             Exercise Goals   Increase Physical Activity Yes       Intervention Provide advice, education, support and counseling about physical activity/exercise needs.;Develop an individualized exercise prescription for aerobic and resistive training based on initial evaluation findings, risk stratification, comorbidities and participant's personal goals.       Expected Outcomes Short Term: Attend rehab on a regular basis to increase amount of physical activity.;Long Term: Add in home exercise to make exercise part of routine and to increase amount of physical activity.;Long Term: Exercising regularly at least 3-5 days a week.       Increase Strength and Stamina Yes       Intervention Provide advice, education, support and counseling about physical activity/exercise needs.;Develop an individualized exercise prescription for aerobic and resistive training based on initial evaluation findings, risk stratification, comorbidities and participant's personal goals.  Expected Outcomes Short Term: Increase workloads from initial exercise prescription for resistance, speed, and METs.;Long Term: Improve cardiorespiratory fitness, muscular endurance and strength as measured by increased METs and functional capacity ( );Short Term: Perform resistance training exercises routinely during rehab and add in resistance training at home       Able to understand and use rate of perceived exertion (RPE) scale Yes       Intervention Provide education and explanation on how to use RPE scale       Expected Outcomes Short Term: Able to use RPE daily in rehab to express subjective intensity level;Long Term:  Able to use RPE to guide intensity level when exercising independently       Able to understand and use Dyspnea scale Yes       Intervention Provide education  and explanation on how to use Dyspnea scale       Expected Outcomes Short Term: Able to use Dyspnea scale daily in rehab to express subjective sense of shortness of breath during exertion;Long Term: Able to use Dyspnea scale to guide intensity level when exercising independently       Knowledge and understanding of Target Heart Rate Range (THRR) Yes       Intervention Provide education and explanation of THRR including how the numbers were predicted and where they are located for reference       Expected Outcomes Short Term: Able to state/look up THRR;Long Term: Able to use THRR to govern intensity when exercising independently;Short Term: Able to use daily as guideline for intensity in rehab       Able to check pulse independently Yes       Intervention Review the importance of being able to check your own pulse for safety during independent exercise;Provide education and demonstration on how to check pulse in carotid and radial arteries.       Expected Outcomes Short Term: Able to explain why pulse checking is important during independent exercise;Long Term: Able to check pulse independently and accurately       Understanding of Exercise Prescription Yes       Intervention Provide education, explanation, and written materials on patient's individual exercise prescription       Expected Outcomes Short Term: Able to explain program exercise prescription;Long Term: Able to explain home exercise prescription to exercise independently                Copy of goals given to participant.

## 2023-05-20 ENCOUNTER — Ambulatory Visit: Payer: Medicare HMO | Admitting: Pulmonary Disease

## 2023-05-24 ENCOUNTER — Encounter (HOSPITAL_COMMUNITY): Payer: Medicare HMO

## 2023-05-24 ENCOUNTER — Institutional Professional Consult (permissible substitution): Payer: Medicare HMO | Admitting: Neurology

## 2023-05-25 ENCOUNTER — Ambulatory Visit: Payer: Medicare HMO | Attending: Nurse Practitioner | Admitting: Nurse Practitioner

## 2023-05-25 ENCOUNTER — Encounter: Payer: Self-pay | Admitting: Nurse Practitioner

## 2023-05-25 ENCOUNTER — Ambulatory Visit (INDEPENDENT_AMBULATORY_CARE_PROVIDER_SITE_OTHER): Payer: Medicare HMO | Admitting: *Deleted

## 2023-05-25 VITALS — BP 130/70 | HR 79 | Ht 64.0 in | Wt 225.0 lb

## 2023-05-25 DIAGNOSIS — I10 Essential (primary) hypertension: Secondary | ICD-10-CM

## 2023-05-25 DIAGNOSIS — E785 Hyperlipidemia, unspecified: Secondary | ICD-10-CM | POA: Diagnosis not present

## 2023-05-25 DIAGNOSIS — I2510 Atherosclerotic heart disease of native coronary artery without angina pectoris: Secondary | ICD-10-CM

## 2023-05-25 DIAGNOSIS — I4821 Permanent atrial fibrillation: Secondary | ICD-10-CM | POA: Diagnosis not present

## 2023-05-25 DIAGNOSIS — Z5181 Encounter for therapeutic drug level monitoring: Secondary | ICD-10-CM

## 2023-05-25 DIAGNOSIS — E119 Type 2 diabetes mellitus without complications: Secondary | ICD-10-CM

## 2023-05-25 DIAGNOSIS — I4891 Unspecified atrial fibrillation: Secondary | ICD-10-CM

## 2023-05-25 DIAGNOSIS — I25119 Atherosclerotic heart disease of native coronary artery with unspecified angina pectoris: Secondary | ICD-10-CM | POA: Diagnosis not present

## 2023-05-25 DIAGNOSIS — I480 Paroxysmal atrial fibrillation: Secondary | ICD-10-CM

## 2023-05-25 DIAGNOSIS — I350 Nonrheumatic aortic (valve) stenosis: Secondary | ICD-10-CM | POA: Diagnosis not present

## 2023-05-25 DIAGNOSIS — R5383 Other fatigue: Secondary | ICD-10-CM | POA: Diagnosis not present

## 2023-05-25 LAB — POCT INR: INR: 2 (ref 2.0–3.0)

## 2023-05-25 MED ORDER — ISOSORBIDE MONONITRATE ER 60 MG PO TB24: 90.0000 mg | ORAL_TABLET | Freq: Every day | ORAL | 1 refills | Status: DC

## 2023-05-25 NOTE — Patient Instructions (Addendum)
Medication Instructions:  Your physician has recommended you make the following change in your medication:  Please increase IMDUR to 90 Mg daily  Continue all other medications as prescribed.   Labwork: In 1 week   Testing/Procedures: None  Follow-Up: Your physician recommends that you schedule a follow-up appointment in: 1 Month   Any Other Special Instructions Will Be Listed Below (If Applicable).  If you need a refill on your cardiac medications before your next appointment, please call your pharmacy.

## 2023-05-25 NOTE — Progress Notes (Unsigned)
Cardiology Office Note:  .   Date:  05/25/2023 ID:  Rachel Odonnell, DOB 1956-08-15, MRN [id number] PCP: Sheela Stack  [facility] Providers Cardiologist:  Nona Dell, MD Cardiology APP:  Netta Neat., NP (Inactive)    History of Present Illness: Rachel Odonnell is a 67 y.o. female with a PMH of CAD, HLD, HTN, PAF, mild aortic valve stenosis, and OSA, who presents today for 1 month follow-up.   Prior history of DES x 1 to prox to mid LAD, abrupt occlusion of diagonal that was jailed by stent. Successfully treated with POBA in August 2024.   Last seen by Robin Searing, NP on 04/23/2023 after cardiac catheterization. Was overall feeling better but felt few episodes of fleeting chest pain. Was staying active. Was cleared to start cardiac rehab.   Today she presents for 1 month follow-up. She says her chest pain seems to be improved slightly, but not significantly. Describes chest pain as dull ache along left lateral side of chest wall, nonradiating. Denies any alleviating or aggravating factors, however says she takes NTG when this occurs, lays down, and pain is resolved. 5/10 on 0-10 pain scale. Says she is tired, not very active. Says she is not taking provigil. Says if she takes this medicine, she will not be able to get sleep and will up awake all night. Denies any shortness of breath, palpitations, syncope, presyncope, dizziness, orthopnea, PND, swelling or significant weight changes, acute bleeding, or claudication.  ROS: Negative. See HPI.   Studies Reviewed: .    Right/left heart cath 03/2023:    Prox RCA to Mid RCA lesion is 35% stenosed.   1st Mrg lesion is 60% stenosed.   Prox LAD to Mid LAD lesion is 60% stenosed.   A drug-eluting stent was successfully placed using a SYNERGY XD 3.0X38.   Post intervention, there is a 0% residual stenosis.   1st Diag lesion is 40% stenosed.   Balloon angioplasty was performed using a BALLN SAPPHIRE 2.0X12.   Post  intervention, there is a 0% residual stenosis.   The left ventricular systolic function is normal.   LV end diastolic pressure is mildly elevated.   The left ventricular ejection fraction is 55-65% by visual estimate.   Hemodynamic findings consistent with mild pulmonary hypertension.   Recommend to resume Warfarin, at currently prescribed dose and frequency on 04/17/2023.   Recommend concurrent antiplatelet therapy of Aspirin 81 mg for 1 month and Clopidogrel 75mg  daily for 6 months .   Single vessel obstructive CAD. Long segment of disease in the proximal to mid LAD. Angiographically this did not appear to be severe but flow was impaired on both CT FFR and directly measured RFR. Severe plaque on IVUS with Minimal lumen diameter of 2.5 mm squared.  Normal LV function. Mildly elevated LV filling pressures. PCWP 21/23 mean 16 mm Hg. LVEDP 21 mm Hg Mild pulmonary HTN PAP 40/14 mean 27 mm Hg Cardiac output 4.3 L/min with index 2.08. Successful PCI of the proximal to mid LAD with IVUS guidance and DES x 1. Abrupt occlusion of diagonal that was jailed by stent. Successfully treated with POBA through the stent struts.    Plan: will monitor overnight on telemetry. DAPT with ASA for one month and Plavix for 6 months. May resume Coumadin tomorrow if no bleeding problems.   Echo 02/2023:   1. Left ventricular ejection fraction, by estimation, is 60 to 65%. Left  ventricular ejection fraction by  3D volume is 63 %. The left ventricle has  normal function. The left ventricle has no regional wall motion  abnormalities. Left ventricular diastolic   parameters are consistent with Grade I diastolic dysfunction (impaired  relaxation). The average left ventricular global longitudinal strain is  -20.2 %. The global longitudinal strain is normal.   2. Right ventricular systolic function is normal. The right ventricular  size is normal.   3. The mitral valve is grossly normal. Trivial mitral valve   regurgitation. No evidence of mitral stenosis.   4. The aortic valve is calcified. There is mild calcification of the  aortic valve. There is mild thickening of the aortic valve. Aortic valve  regurgitation is not visualized. Mild aortic valve stenosis. Aortic valve  area, by VTI measures 1.55 cm.  Aortic valve mean gradient measures 12.0 mmHg. Aortic valve Vmax measures  2.32 m/s.  CCTA 02/2023: IMPRESSION: 1. Coronary calcium score of 153. This was 83rd percentile for age-, sex, and race-matched controls.   2. Total plaque volume 313 mm3 which is 64th percentile for age- and sex-matched controls (calcified plaque 59 mm3; non-calcified plaque 254 mm3). TPV is severe.   3. Normal coronary origin with right dominance.   4. There is moderate (50-69%) calcified plaque in the LAD and LCX. CAD-RADS 3.   5. Will send study for FFRct.  5. Aortic atherosclerosis.   IMPRESSION: 1. FFRct findings are consistent with significant stenosis in the mid LAD.   2.  Recommend cardiac catheterization.  Cardiac monitor 04/2021:  12 day monitor Rare supraventricular ectopy in the form of isolated PACs, couplets, triplets. Fourteen episodes of SVT longest 8 beats Rare ventricular ectopy in the form of isolated PVCs, couplets. Four episodes of NSVT longest 15 beats. Reported symptoms correlated with sinus rhythm, PACs, PVCs, and afib Episodes of afib <1% burden, rates were controlled Single nocturnal pause 3.1 seconds     Patch Wear Time:  12 days and 7 hours (2022-08-22T11:20:36-0400 to 2022-09-03T18:39:38-0400)   Patient had a min HR of 45 bpm, max HR of 128 bpm, and avg HR of 58 bpm. Predominant underlying rhythm was Sinus Rhythm. First Degree AV Block was present. 4 Ventricular Tachycardia runs occurred, the run with the fastest interval lasting 15 beats with a  max rate of 128 bpm (avg 119 bpm); the run with the fastest interval was also the longest. 14 Supraventricular Tachycardia runs  occurred, the run with the fastest interval lasting 8 beats with a max rate of 121 bpm (avg 99 bpm); the run with the fastest  interval was also the longest. Atrial Fibrillation occurred (<1% burden), ranging from 64-116 bpm (avg of 82 bpm), the longest lasting 4 mins 51 secs with an avg rate of 79 bpm. 1 Pause occurred lasting 3.1 secs (19 bpm). Atrial Fibrillation was  detected within +/- 45 seconds of symptomatic patient event(s). Isolated SVEs were rare (<1.0%), SVE Couplets were rare (<1.0%), and SVE Triplets were rare (<1.0%). Isolated VEs were rare (<1.0%), VE Couplets were rare (<1.0%), and no VE Triplets were  present. Risk Assessment/Calculations:    CHA2DS2-VASc Score = 4  This indicates a 4.8% annual risk of stroke. The patient's score is based upon: CHF History: 0 HTN History: 1 Diabetes History: 0 Stroke History: 0 Vascular Disease History: 1 Age Score: 1 Gender Score: 1     The 10-year ASCVD risk score (Arnett DK, et al., 2019) is: 29.5%   Values used to calculate the score:     Age:  66 years     Sex: Female     Is Non-Hispanic African American: No     Diabetic: Yes     Tobacco smoker: Yes     Systolic Blood Pressure: 130 mmHg     Is BP treated: Yes     HDL Cholesterol: 43 mg/dL     Total Cholesterol: 174 mg/dL      Physical Exam:   VS:  BP 130/70   Pulse 79   Ht 5\' 4"  (1.626 m)   Wt 225 lb (102.1 kg)   SpO2 93%   BMI 38.62 kg/m    Wt Readings from Last 3 Encounters:  05/25/23 225 lb (102.1 kg)  05/19/23 228 lb 13.4 oz (103.8 kg)  04/23/23 227 lb (103 kg)    GEN: Well nourished, well developed in no acute distress NECK: No JVD; No carotid bruits CARDIAC: S1/S2, irregular rhythm and regular rate, no murmurs, rubs, gallops RESPIRATORY:  Clear to auscultation without rales, wheezing or rhonchi  ABDOMEN: Soft, non-tender, non-distended EXTREMITIES:  No edema; No deformity   ASSESSMENT AND PLAN: .    CAD Slightly improved chest pain, not a significant  improvement. Cardiac catheterization report noted above. Completed triple therapy per cardiac cath recommendations. Continue Plavix, fenofibrate, rosuvastatin, and NTG PRN. Will increase Imdur to 90 mg daily. Heart healthy diet and regular cardiovascular exercise encouraged. Care and ED precautions discussed.   HLD Lipoprotein (a) 148. Per patient's report, crestor dosage was increased. Will recheck lipid profile and LFT. Continue current medication regimen. Heart healthy diet and regular cardiovascular exercise encouraged.   HTN BP stable. Discussed to monitor BP at home at least 2 hours after medications and sitting for 5-10 minutes. Increasing Imdur as mentioned above. No other medication changes at this time. Heart healthy diet and regular cardiovascular exercise encouraged.   A-fib Denies any tachycardia or palpitations. HR well controlled today. [facility]ed [facility]. Continue Coumadin, denies any bleeding issues.   Mild aortic valve stenosis Mild aortic valve stenosis noted on most recent Eco with mean gradient measuring 12 mmHg. Denies any symptoms. Will continue to monitor and recommend updating Echo in 1 year, July 2025.   Fatigue Etiology multifactorial. Believe not being on provigil is affecting her energy level. Recommended to discuss this further with PCP. She verbalized understanding.   Dispo: Follow-up with me/APP in 1 month or sooner if anything changes.   Signed, Sharlene Dory, NP

## 2023-05-25 NOTE — Patient Instructions (Signed)
Continue warfarin 1/2 tablet daily except 1 tablet on Sundays, Tuesdays and Thursdays. Recheck INR in 4 weeks.  

## 2023-05-26 ENCOUNTER — Encounter (HOSPITAL_COMMUNITY): Payer: Medicare HMO

## 2023-05-26 ENCOUNTER — Encounter (HOSPITAL_COMMUNITY): Payer: Self-pay | Admitting: *Deleted

## 2023-05-26 DIAGNOSIS — Z955 Presence of coronary angioplasty implant and graft: Secondary | ICD-10-CM

## 2023-05-26 NOTE — Progress Notes (Signed)
Cardiac Individual Treatment Plan  Patient Details  Name: Rachel Odonnell MRN: [id number] Date of Birth: 1955/10/19 Referring Provider:   Flowsheet Row CARDIAC REHAB PHASE II ORIENTATION from 05/19/2023 in [place] [facility]  Referring Provider [place], Peter MD       Initial Encounter Date:  Flowsheet Row CARDIAC REHAB PHASE II ORIENTATION from 05/19/2023 in [place] [facility]AC [facility]  Date 05/19/23       Visit Diagnosis: Status post primary angioplasty with coronary stent  Patient's Home Medications on Admission:  Current Outpatient Medications:    Accu-Chek Softclix Lancets lancets, Use as instructed to test blood glucose four times daily, Disp: 100 each, Rfl: 2   aspirin EC 81 MG tablet, Take 1 tablet (81 mg total) by mouth daily. Swallow whole. Take for 30 days total then stop. Last dose 05/15/23., Disp: 28 tablet, Rfl: 0   busPIRone (BUSPAR) 5 MG tablet, Take 5 mg by mouth at bedtime., Disp: , Rfl:    Cholecalciferol (VITAMIN D3) 125 MCG (5000 UT) CAPS, Take 1 capsule (5,000 Units total) by mouth daily., Disp: 90 capsule, Rfl: 0   citalopram (CELEXA) 20 MG tablet, Take 1 tablet (20 mg total) by mouth every evening., Disp: 30 tablet, Rfl: 1   clopidogrel (PLAVIX) 75 MG tablet, Take 1 tablet (75 mg total) by mouth daily., Disp: 90 tablet, Rfl: 3   Continuous Blood Gluc Sensor (FREESTYLE LIBRE 2 SENSOR) MISC, Change sensor every 14 days. Use to check glucose continuously, Disp: 2 each, Rfl: 0   Continuous Glucose Receiver (FREESTYLE LIBRE 2 READER) DEVI, USE TO CHECK GLUCOSE AS DIRECTED, Disp: 1 each, Rfl: 0   donepezil (ARICEPT) 5 MG tablet, Take 5 mg by mouth every evening., Disp: , Rfl:    fenofibrate (TRICOR) 145 MG tablet, Take 145 mg by mouth every evening., Disp: , Rfl:    gabapentin (NEURONTIN) 100 MG capsule, Take 1 capsule (100 mg total) by mouth at bedtime., Disp: , Rfl:    glipiZIDE (GLUCOTROL) 5 MG tablet, Take 1 tablet (5 mg total) by mouth daily  before breakfast., Disp: 90 tablet, Rfl: 1   glucose blood test strip, 1 each by Other route as needed. Use as instructed to test blood glucose four times daily, Disp: 100 each, Rfl: 2   indapamide (LOZOL) 2.5 MG tablet, Take 1 tablet (2.5 mg total) by mouth daily., Disp: 30 tablet, Rfl: 11   insulin aspart (NOVOLOG FLEXPEN) 100 UNIT/ML FlexPen, Inject 10-16 Units into the skin 3 (three) times daily before meals. (Patient taking differently: Inject 10-16 Units into the skin 3 (three) times daily before meals. Sliding scale 90-150=10 units 151-200=11 units 201-250=12 units 251-300=13 units 301-400=14 units 400=16 units Over 400 call MD), Disp: 15 mL, Rfl: 0   insulin degludec (TRESIBA FLEXTOUCH) 200 UNIT/ML FlexTouch Pen, Inject 100 Units into the skin at bedtime., Disp: 45 mL, Rfl: 0   Insulin Pen Needle (B-D ULTRAFINE III SHORT PEN) 31G X 8 MM MISC, 1 each by Does not apply route as directed., Disp: 100 each, Rfl: 2   isosorbide mononitrate (IMDUR) 60 MG 24 hr tablet, Take 1.5 tablets (90 mg total) by mouth daily. Can take an additional half tab as needed for chest pain, Disp: 135 tablet, Rfl: 1   levocetirizine (XYZAL) 5 MG tablet, Take 5 mg by mouth every evening., Disp: , Rfl:    levothyroxine (SYNTHROID) 50 MCG tablet, TAKE 1 TABLET BY MOUTH ONCE DAILY BEFORE BREAKFAST, Disp: 90 tablet, Rfl: 0   metFORMIN (GLUCOPHAGE)  500 MG tablet, TAKE 1 TABLET TWICE DAILY WITH MEALS, Disp: 180 tablet, Rfl: 1   mirabegron ER (MYRBETRIQ) 25 MG TB24 tablet, Take 1 tablet (25 mg total) by mouth daily., Disp: 30 tablet, Rfl: 11   modafinil (PROVIGIL) 100 MG tablet, Take 1 tablet (100 mg total) by mouth daily., Disp: 30 tablet, Rfl: 0   montelukast (SINGULAIR) 10 MG tablet, Take 10 mg by mouth at bedtime., Disp: , Rfl:    Multiple Vitamin (MULTIVITAMIN ADULT PO), Take 1 tablet by mouth daily., Disp: , Rfl:    nitroGLYCERIN (NITROSTAT) 0.4 MG SL tablet, Place 1 tablet (0.4 mg total) under the tongue every 5 (five)  minutes as needed for chest pain., Disp: 25 tablet, Rfl: 3   OVER THE COUNTER MEDICATION, Take 2 capsules by mouth daily. Omega XL, Disp: , Rfl:    pantoprazole (PROTONIX) 40 MG tablet, Take 2 tablets (80 mg total) by mouth daily., Disp: 60 tablet, Rfl: 1   rosuvastatin (CRESTOR) 40 MG tablet, Take 1 tablet (40 mg total) by mouth daily., Disp: 30 tablet, Rfl: 5   warfarin (COUMADIN) 5 MG tablet, Take 1/2 a tablet to 1 tablet by mouth daily as directed by the coumadin clinic. (Patient taking differently: Take 2.5-5 mg by mouth See admin instructions. Take 2.5 mg Mon., Wed., Fri., and Sat. Take 5 mg Sun., Tues., and Thurs.  as directed by the coumadin clinic.), Disp: 90 tablet, Rfl: 0  Past Medical History: Past Medical History:  Diagnosis Date   ADD (attention deficit disorder)    Diabetes mellitus without complication (HCC)    Hypertension     Tobacco Use: Social History   Tobacco Use  Smoking Status Former   Current packs/day: 0.00   Types: Cigarettes   Quit date: 08/17/1994   Years since quitting: 28.7  Smokeless Tobacco Never    Labs: Review Flowsheet  More data exists      Latest Ref Rng & Units 09/01/2022 12/01/2022 02/19/2023 03/04/2023 04/16/2023  Labs for ITP Cardiac and Pulmonary Rehab  Cholestrol 100 - 199 mg/dL - - 027  - -  LDL (calc) 0 - 99 mg/dL - - 91  - -  HDL-C >25 mg/dL - - 43  - -  Trlycerides 0 - 149 mg/dL - - 366  - -  Hemoglobin A1c 4.8 - 5.6 % 9.9  10.4  - 10.2  11.4   PH, Arterial 7.35 - 7.45 - - - - 7.375   PCO2 arterial 32 - 48 mmHg - - - - 41.9   Bicarbonate 20.0 - 28.0 mmol/L - - - - 27.1  27.2  24.6   TCO2 22 - 32 mmol/L - - - - 29  29  26    Acid-base deficit 0.0 - 2.0 mmol/L - - - - 1.0   O2 Saturation % - - - - 62  63  97     Details       Multiple values from one day are sorted in reverse-chronological order         Capillary Blood Glucose: Lab Results  Component Value Date   GLUCAP 304 (H) 04/17/2023   GLUCAP 292 (H) 04/17/2023    GLUCAP 286 (H) 04/16/2023   GLUCAP 297 (H) 04/16/2023   GLUCAP 388 (H) 04/16/2023     Exercise Target Goals: Exercise Program Goal: Individual exercise prescription set using results from initial 6 min walk test and THRR while considering  patient's activity barriers and safety.   Exercise Prescription Goal:  Starting with aerobic activity 30 plus minutes a day, 3 days per week for initial exercise prescription. Provide home exercise prescription and guidelines that participant acknowledges understanding prior to discharge.  Activity Barriers & Risk Stratification:  Activity Barriers & Cardiac Risk Stratification - 05/12/23 1359       Activity Barriers & Cardiac Risk Stratification   Activity Barriers Shortness of Breath;Arthritis;Back Problems;Chest Pain/Angina;Joint Problems;Balance Concerns;History of Falls;Deconditioning;Muscular Weakness    Cardiac Risk Stratification Moderate             6 Minute Walk:  6 Minute Walk     Row Name 05/19/23 1600         6 Minute Walk   Phase Initial     Distance 950 feet     Walk Time 6 minutes     # of Rest Breaks 0     MPH 1.79     METS 1.73     RPE 13     Perceived Dyspnea  1     VO2 Peak 6.08     Symptoms Yes (comment)     Comments BL hip pain 3/10     Resting HR 60 bpm     Resting BP 138/52     Resting Oxygen Saturation  95 %     Exercise Oxygen Saturation  during 6 min walk 92 %     Max Ex. HR 80 bpm     Max Ex. BP 156/50     2 Minute Post BP 136/52              Oxygen Initial Assessment:   Oxygen Re-Evaluation:   Oxygen Discharge (Final Oxygen Re-Evaluation):   Initial Exercise Prescription:  Initial Exercise Prescription - 05/19/23 1600       Date of Initial Exercise RX and Referring Provider   Date 05/19/23    Referring Provider [place], Peter MD      Oxygen   Maintain Oxygen Saturation 88% or higher      Treadmill   MPH 1.3    Grade 0    Minutes 15    METs 1.7      NuStep   Level 1     SPM 80    Minutes 15    METs 1.5      REL-XR   Level 1    Speed 50    Minutes 15    METs 1.5      Prescription Details   Frequency (times per week) 3    Duration Progress to 30 minutes of continuous aerobic without signs/symptoms of physical distress      Intensity   THRR 40-80% of Max Heartrate 97-134    Ratings of Perceived Exertion 11-13    Perceived Dyspnea 0-4      Resistance Training   Training Prescription Yes    Weight 2 lbs    Reps 10-15             Perform Capillary Blood Glucose checks as needed.  Exercise Prescription Changes:   Exercise Prescription Changes     Row Name 05/19/23 1600             Response to Exercise   Blood Pressure (Admit) 138/52       Blood Pressure (Exercise) 156/50       Blood Pressure (Exit) 136/52       Heart Rate (Admit) 60 bpm       Heart Rate (Exercise) 80 bpm  Heart Rate (Exit) 70 bpm       Oxygen Saturation (Admit) 95 %       Oxygen Saturation (Exercise) 92 %       Oxygen Saturation (Exit) 97 %       Rating of Perceived Exertion (Exercise) 13       Perceived Dyspnea (Exercise) 1       Duration Continue with 30 min of aerobic exercise without signs/symptoms of physical distress.       Intensity THRR unchanged         Progression   Progression Continue to progress workloads to maintain intensity without signs/symptoms of physical distress.                Exercise Comments:   Exercise Goals and Review:   Exercise Goals     Row Name 05/19/23 1606             Exercise Goals   Increase Physical Activity Yes       Intervention Provide advice, education, support and counseling about physical activity/exercise needs.;Develop an individualized exercise prescription for aerobic and resistive training based on initial evaluation findings, risk stratification, comorbidities and participant's personal goals.       Expected Outcomes Short Term: Attend rehab on a regular basis to increase amount of  physical activity.;Long Term: Add in home exercise to make exercise part of routine and to increase amount of physical activity.;Long Term: Exercising regularly at least 3-5 days a week.       Increase Strength and Stamina Yes       Intervention Provide advice, education, support and counseling about physical activity/exercise needs.;Develop an individualized exercise prescription for aerobic and resistive training based on initial evaluation findings, risk stratification, comorbidities and participant's personal goals.       Expected Outcomes Short Term: Increase workloads from initial exercise prescription for resistance, speed, and METs.;Long Term: Improve cardiorespiratory fitness, muscular endurance and strength as measured by increased METs and functional capacity ( );Short Term: Perform resistance training exercises routinely during rehab and add in resistance training at home       Able to understand and use rate of perceived exertion (RPE) scale Yes       Intervention Provide education and explanation on how to use RPE scale       Expected Outcomes Short Term: Able to use RPE daily in rehab to express subjective intensity level;Long Term:  Able to use RPE to guide intensity level when exercising independently       Able to understand and use Dyspnea scale Yes       Intervention Provide education and explanation on how to use Dyspnea scale       Expected Outcomes Short Term: Able to use Dyspnea scale daily in rehab to express subjective sense of shortness of breath during exertion;Long Term: Able to use Dyspnea scale to guide intensity level when exercising independently       Knowledge and understanding of Target Heart Rate Range (THRR) Yes       Intervention Provide education and explanation of THRR including how the numbers were predicted and where they are located for reference       Expected Outcomes Short Term: Able to state/look up THRR;Long Term: Able to use THRR to govern intensity  when exercising independently;Short Term: Able to use daily as guideline for intensity in rehab       Able to check pulse independently Yes       Intervention Review the  importance of being able to check your own pulse for safety during independent exercise;Provide education and demonstration on how to check pulse in carotid and radial arteries.       Expected Outcomes Short Term: Able to explain why pulse checking is important during independent exercise;Long Term: Able to check pulse independently and accurately       Understanding of Exercise Prescription Yes       Intervention Provide education, explanation, and written materials on patient's individual exercise prescription       Expected Outcomes Short Term: Able to explain program exercise prescription;Long Term: Able to explain home exercise prescription to exercise independently                Exercise Goals Re-Evaluation :    Discharge Exercise Prescription (Final Exercise Prescription Changes):  Exercise Prescription Changes - 05/19/23 1600       Response to Exercise   Blood Pressure (Admit) 138/52    Blood Pressure (Exercise) 156/50    Blood Pressure (Exit) 136/52    Heart Rate (Admit) 60 bpm    Heart Rate (Exercise) 80 bpm    Heart Rate (Exit) 70 bpm    Oxygen Saturation (Admit) 95 %    Oxygen Saturation (Exercise) 92 %    Oxygen Saturation (Exit) 97 %    Rating of Perceived Exertion (Exercise) 13    Perceived Dyspnea (Exercise) 1    Duration Continue with 30 min of aerobic exercise without signs/symptoms of physical distress.    Intensity THRR unchanged      Progression   Progression Continue to progress workloads to maintain intensity without signs/symptoms of physical distress.             Nutrition:  Target Goals: Understanding of nutrition guidelines, daily intake of sodium 1500mg , cholesterol 200mg , calories 30% from fat and 7% or less from saturated fats, daily to have 5 or more servings of fruits  and vegetables.  Biometrics:  Pre Biometrics - 05/19/23 1606       Pre Biometrics   Height 5\' 4"  (1.626 m)    Weight 228 lb 13.4 oz (103.8 kg)    Waist Circumference 46 inches    Hip Circumference 51 inches    Waist to Hip Ratio 0.9 %    BMI (Calculated) 39.26    Grip Strength 22.6 kg    Single Leg Stand 4.63 seconds              Nutrition Therapy Plan and Nutrition Goals:  Nutrition Therapy & Goals - 05/12/23 1410       Personal Nutrition Goals   Comments The pt eats 2 meals a day and sometimes eats snacks in between.  She tries to eat healthy but gets confused about what she can eat.  The pt eats chicken and fish, but she does eat red meat at times.  She tries to eat mainly grilled meat vs fried.      Intervention Plan   Intervention Nutrition handout(s) given to patient.    Expected Outcomes Short Term Goal: Understand basic principles of dietary content, such as calories, fat, sodium, cholesterol and nutrients.;Long Term Goal: Adherence to prescribed nutrition plan.             Nutrition Assessments:  MEDIFICTS Score Key: >=70 Need to make dietary changes  40-70 Heart Healthy Diet <= 40 Therapeutic Level Cholesterol Diet  Flowsheet Row CARDIAC REHAB PHASE II ORIENTATION from 05/19/2023 in [facility]  Picture Your Plate Total  Score on Admission 44      Picture Your Plate Scores: <40 Unhealthy dietary pattern with much room for improvement. 41-50 Dietary pattern unlikely to meet recommendations for good health and room for improvement. 51-60 More healthful dietary pattern, with some room for improvement.  >60 Healthy dietary pattern, although there may be some specific behaviors that could be improved.    Nutrition Goals Re-Evaluation:   Nutrition Goals Discharge (Final Nutrition Goals Re-Evaluation):   Psychosocial: Target Goals: Acknowledge presence or absence of significant depression and/or stress, maximize coping skills,  provide positive support system. Participant is able to verbalize types and ability to use techniques and skills needed for reducing stress and depression.  Initial Review & Psychosocial Screening:  Initial Psych Review & Screening - 05/12/23 1417       Initial Review   Current issues with History of Depression;Current Psychotropic Meds;Current Sleep Concerns      Family Dynamics   Good Support System? Yes    Comments The pt's husband and her sisters are her main support system.      Barriers   Psychosocial barriers to participate in program There are no identifiable barriers or psychosocial needs.      Screening Interventions   Interventions Encouraged to exercise;Provide feedback about the scores to participant;To provide support and resources with identified psychosocial needs    Expected Outcomes Long Term Goal: Stressors or current issues are controlled or eliminated.;Short Term goal: Identification and review with participant of any Quality of Life or Depression concerns found by scoring the questionnaire.;Long Term goal: The participant improves quality of Life and PHQ9 Scores as seen by post scores and/or verbalization of changes             Quality of Life Scores:  Quality of Life - 05/19/23 1317       Quality of Life Scores   Health/Function Pre 14.43 %    Socioeconomic Pre 26.44 %    Psych/Spiritual Pre 28.29 %    Family Pre 30 %    GLOBAL Pre 22.17 %            Scores of 19 and below usually indicate a poorer quality of life in these areas.  A difference of  2-3 points is a clinically meaningful difference.  A difference of 2-3 points in the total score of the Quality of Life Index has been associated with significant improvement in overall quality of life, self-image, physical symptoms, and general health in studies assessing change in quality of life.  PHQ-9: Review Flowsheet  More data may exist      05/19/2023 10/10/2021 07/30/2021 04/18/2021 03/28/2021   Depression screen PHQ 2/9  Decreased Interest 0 0 0 1 1  Down, Depressed, Hopeless 0 1 1 1 1   PHQ - 2 Score 0 1 1 2 2   Altered sleeping 3 - - - 1  Tired, decreased energy 2 - - - 1  Change in appetite 0 - - - 1  Feeling bad or failure about yourself  0 - - - 0  Trouble concentrating 0 - - - 0  Moving slowly or fidgety/restless 0 - - - 0  Suicidal thoughts 0 - - - 0  PHQ-9 Score 5 - - - 5  Difficult doing work/chores Not difficult at all - - - -    Details           Interpretation of Total Score  Total Score Depression Severity:  1-4 = Minimal depression, 5-9 = Mild  depression, 10-14 = Moderate depression, 15-19 = Moderately severe depression, 20-27 = Severe depression   Psychosocial Evaluation and Intervention:  Psychosocial Evaluation - 05/12/23 1420       Psychosocial Evaluation & Interventions   Interventions Encouraged to exercise with the program and follow exercise prescription;Stress management education;Relaxation education    Comments Pt was referred to cardiac rehab since she had primary angioplasty with a coronary stent.  She denies anxiety/depression or suicidal thoughts, and she states that she did have a history of depression but was referred to a psyschiatrist at [facility] in [place].  She states that talking to the psychiatrist made her feel like a "weight was lifted."  She takes Buspar and  Celexa, as well Modafinil to keep her awake during the day.  The pt states that if she does not take her Modafinil in the morning, then she will sleep for a large portion of the day.  The fatigue and sleepiness make it hard for her to get motivivated to exercise, and she has jaw pain if she pushes herself too hard with exercise (she stated MD is aware).  She has a good support system in her husband and sisters, and they encourage her to get active at home.  Her goals for the program are to increase her strength/endurance so that she will feel better, and she wants to lose weight and  be able to walk with less shortness of breath.    Continue Psychosocial Services  Follow up required by staff             Psychosocial Re-Evaluation:   Psychosocial Discharge (Final Psychosocial Re-Evaluation):   Vocational Rehabilitation: Provide vocational rehab assistance to qualifying candidates.   Vocational Rehab Evaluation & Intervention:  Vocational Rehab - 05/12/23 1416       Vocational Rehab Re-Evaulation   Comments Pt is retired and has no plans to return to work.             Education: Education Goals: Education classes will be provided on a weekly basis, covering required topics. Participant will state understanding/return demonstration of topics presented.  Learning Barriers/Preferences:  Learning Barriers/Preferences - 05/12/23 1414       Learning Barriers/Preferences   Learning Barriers Exercise Concerns   memory impairment per pt   Learning Preferences Verbal Instruction;Skilled Demonstration;Video             Education Topics: Hypertension, Hypertension Reduction -Define heart disease and high blood pressure. Discus how high blood pressure affects the body and ways to reduce high blood pressure.   Exercise and Your Heart -Discuss why it is important to exercise, the FITT principles of exercise, normal and abnormal responses to exercise, and how to exercise safely.   Angina -Discuss definition of angina, causes of angina, treatment of angina, and how to decrease risk of having angina.   Cardiac Medications -Review what the following cardiac medications are used for, how they affect the body, and side effects that may occur when taking the medications.  Medications include Aspirin, Beta blockers, calcium channel blockers, ACE Inhibitors, angiotensin receptor blockers, diuretics, digoxin, and antihyperlipidemics.   Congestive Heart Failure -Discuss the definition of CHF, how to live with CHF, the signs and symptoms of CHF, and how keep  track of weight and sodium intake.   Heart Disease and Intimacy -Discus the effect sexual activity has on the heart, how changes occur during intimacy as we age, and safety during sexual activity.   Smoking Cessation / COPD -Discuss different methods  to quit smoking, the health benefits of quitting smoking, and the definition of COPD.   Nutrition I: Fats -Discuss the types of cholesterol, what cholesterol does to the heart, and how cholesterol levels can be controlled.   Nutrition II: Labels -Discuss the different components of food labels and how to read food label   Heart Parts/Heart Disease and PAD -Discuss the anatomy of the heart, the pathway of blood circulation through the heart, and these are affected by heart disease.   Stress I: Signs and Symptoms -Discuss the causes of stress, how stress may lead to anxiety and depression, and ways to limit stress.   Stress II: Relaxation -Discuss different types of relaxation techniques to limit stress.   Warning Signs of Stroke / TIA -Discuss definition of a stroke, what the signs and symptoms are of a stroke, and how to identify when someone is having stroke.   Knowledge Questionnaire Score:  Knowledge Questionnaire Score - 05/12/23 1449       Knowledge Questionnaire Score   Pre Score 21             Core Components/Risk Factors/Patient Goals at Admission:  Personal Goals and Risk Factors at Admission - 05/12/23 1421       Core Components/Risk Factors/Patient Goals on Admission    Weight Management Yes;Weight Loss;Obesity    Intervention Weight Management/Obesity: Establish reasonable short term and long term weight goals.;Obesity: Provide education and appropriate resources to help participant work on and attain dietary goals.    Expected Outcomes Short Term: Continue to assess and modify interventions until short term weight is achieved;Long Term: Adherence to nutrition and physical activity/exercise program aimed  toward attainment of established weight goal;Weight Loss: Understanding of general recommendations for a balanced deficit meal plan, which promotes 1-2 lb weight loss per week and includes a negative energy balance of [phone] kcal/d;Understanding recommendations for meals to include 15-35% energy as protein, 25-35% energy from fat, 35-60% energy from carbohydrates, less than 200mg  of dietary cholesterol, 20-35 gm of total fiber daily    Improve shortness of breath with ADL's Yes    Intervention Provide education, individualized exercise plan and daily activity instruction to help decrease symptoms of SOB with activities of daily living.    Expected Outcomes Short Term: Improve cardiorespiratory fitness to achieve a reduction of symptoms when performing ADLs;Long Term: Be able to perform more ADLs without symptoms or delay the onset of symptoms    Diabetes Yes    Intervention Provide education about signs/symptoms and action to take for hypo/hyperglycemia.;Provide education about proper nutrition, including hydration, and aerobic/resistive exercise prescription along with prescribed medications to achieve blood glucose in normal ranges: Fasting glucose 65-99 mg/dL    Expected Outcomes Short Term: Participant verbalizes understanding of the signs/symptoms and immediate care of hyper/hypoglycemia, proper foot care and importance of medication, aerobic/resistive exercise and nutrition plan for blood glucose control.;Long Term: Attainment of HbA1C < 7%.    Hypertension Yes    Intervention Provide education on lifestyle modifcations including regular physical activity/exercise, weight management, moderate sodium restriction and increased consumption of fresh fruit, vegetables, and low fat dairy, alcohol moderation, and smoking cessation.;Monitor prescription use compliance.    Expected Outcomes Short Term: Continued assessment and intervention until BP is < 140/18mm HG in hypertensive participants. < 130/5mm HG  in hypertensive participants with diabetes, heart failure or chronic kidney disease.;Long Term: Maintenance of blood pressure at goal levels.    Lipids Yes    Intervention Provide education and support for participant  on nutrition & aerobic/resistive exercise along with prescribed medications to achieve LDL 70mg , HDL >40mg .    Expected Outcomes Short Term: Participant states understanding of desired cholesterol values and is compliant with medications prescribed. Participant is following exercise prescription and nutrition guidelines.;Long Term: Cholesterol controlled with medications as prescribed, with individualized exercise RX and with personalized nutrition plan. Value goals: LDL < 70mg , HDL > 40 mg.    Personal Goal Other Yes    Personal Goal The pt wants to get "back to normal."  The pt wants to lose weight and be able to walk without getting so short of breath.  She wants to increase her strength/endurance to feel better.    Intervention Pt will attend CR 3 times a week and start a home exercise routine.    Expected Outcomes Pt will meet both her personal and program goals.             Core Components/Risk Factors/Patient Goals Review:    Core Components/Risk Factors/Patient Goals at Discharge (Final Review):    ITP Comments:  ITP Comments     Row Name 05/19/23 1612 05/26/23 1204         ITP Comments Patient arrived for 1st visit/orientation/education at 1400. Patient was referred to CR by Dr. Peter [place] due to S/P primary angioplasty with stent placement. During orientation advised patient on arrival and appointment times what to wear, what to do before, during and after exercise. Reviewed attendance and class policy.  Pt is scheduled to return Cardiac Rehab on 05/24/23 at 1430. Pt was advised to come to class 15 minutes before class starts.  Discussed RPE/Dpysnea scales. Patient participated in warm up stretches. Patient was able to complete 6 minute walk test.  Telemetry:NSR.  Patient was measured for the equipment. Discussed equipment safety with patient. Took patient pre-anthropometric measurements. Patient finished visit at 1315. 30 day review completed. ITP sent to Dr. Dina Rich, Medical Director of Cardiac Rehab. Continue with ITP unless changes are made by physician.  Pt has only completed orientation thus far.               Comments: 30 day review

## 2023-05-28 ENCOUNTER — Other Ambulatory Visit: Payer: Self-pay | Admitting: Cardiology

## 2023-05-28 ENCOUNTER — Encounter (HOSPITAL_COMMUNITY): Payer: Medicare HMO

## 2023-05-28 DIAGNOSIS — I4891 Unspecified atrial fibrillation: Secondary | ICD-10-CM

## 2023-05-28 NOTE — Telephone Encounter (Signed)
Warfarin 5mg  refill [facility] Last INR 05/25/23 Last OV 05/25/23

## 2023-05-29 DIAGNOSIS — R42 Dizziness and giddiness: Secondary | ICD-10-CM | POA: Diagnosis not present

## 2023-05-29 DIAGNOSIS — R531 Weakness: Secondary | ICD-10-CM | POA: Diagnosis not present

## 2023-05-29 DIAGNOSIS — Z5321 Procedure and treatment not carried out due to patient leaving prior to being seen by health care provider: Secondary | ICD-10-CM | POA: Diagnosis not present

## 2023-05-29 DIAGNOSIS — R519 Headache, unspecified: Secondary | ICD-10-CM | POA: Diagnosis not present

## 2023-05-29 DIAGNOSIS — R7309 Other abnormal glucose: Secondary | ICD-10-CM | POA: Diagnosis not present

## 2023-05-31 ENCOUNTER — Encounter (HOSPITAL_COMMUNITY)
Admission: RE | Admit: 2023-05-31 | Discharge: 2023-05-31 | Disposition: A | Discharge status: Home / Self Care | Payer: Medicare HMO | Source: Ambulatory Visit | Attending: Cardiology

## 2023-05-31 ENCOUNTER — Telehealth: Payer: Self-pay

## 2023-05-31 DIAGNOSIS — Z955 Presence of coronary angioplasty implant and graft: Secondary | ICD-10-CM | POA: Diagnosis not present

## 2023-05-31 LAB — GLUCOSE, CAPILLARY
Glucose-Capillary: 286 mg/dL — ABNORMAL HIGH (ref 70–99)
Glucose-Capillary: 230 mg/dL — ABNORMAL HIGH (ref 70–99)

## 2023-05-31 NOTE — Progress Notes (Signed)
Daily Session Note  Patient Details  Name: Rachel Odonnell MRN: [id number] Date of Birth: [birth year] Referring Provider:   Flowsheet Row CARDIAC REHAB PHASE II ORIENTATION from 05/19/2023 in [facility] CARDIAC REHABILITATION  Referring Provider [place], Peter MD       Encounter Date: 05/31/2023  Check In:  Session Check In - 05/31/23 1430       Check-In   Supervising physician immediately available to respond to emergencies See telemetry face sheet for immediately available MD    Location [facility]    Staff Present Ross Ludwig, BS, Exercise Physiologist;Jessica Juanetta Gosling, MA, RCEP, CCRP, CCET    Virtual Visit No    Medication changes reported     No    Fall or balance concerns reported    Yes    Comments Pt has a had a few falls due to dizziness/lightheadedness, does not use any assistive device    Tobacco Cessation No Change    Warm-up and Cool-down Performed on first and last piece of equipment    Resistance Training Performed Yes    VAD Patient? No    PAD/SET Patient? No      Pain Assessment   Currently in Pain? No/denies    Multiple Pain Sites No             Capillary Blood Glucose: Results for orders placed or performed during the hospital encounter of 05/31/23 (from the past 24 hour(s))  Glucose, capillary     Status: Abnormal   Collection Time: 05/31/23  2:37 PM  Result Value Ref Range   Glucose-Capillary 286 (H) 70 - 99 mg/dL      Social History   Tobacco Use  Smoking Status Former   Current packs/day: 0.00   Types: Cigarettes   Quit date: 08/17/1994   Years since quitting: 28.8  Smokeless Tobacco Never    Goals Met:  Independence with exercise equipment Exercise tolerated well No report of concerns or symptoms today Strength training completed today  Goals Unmet:  Not Applicable  Comments: First full day of exercise!  Patient was oriented to gym and equipment including functions, settings, policies, and procedures.   Patient's individual exercise prescription and treatment plan were reviewed.  All starting workloads were established based on the results of the 6 minute walk test done at initial orientation visit.  The plan for exercise progression was also introduced and progression will be customized based on patient's performance and goals.

## 2023-05-31 NOTE — Telephone Encounter (Signed)
Left a message requesting pt return call to the office. 

## 2023-06-02 ENCOUNTER — Encounter (HOSPITAL_COMMUNITY)
Admission: RE | Admit: 2023-06-02 | Discharge: 2023-06-02 | Disposition: A | Discharge status: Home / Self Care | Payer: Medicare HMO | Source: Ambulatory Visit | Attending: Cardiology | Admitting: Cardiology

## 2023-06-02 DIAGNOSIS — Z955 Presence of coronary angioplasty implant and graft: Secondary | ICD-10-CM

## 2023-06-02 NOTE — Progress Notes (Signed)
Daily Session Note  Patient Details  Name: Rachel Odonnell MRN: [id number] Date of Birth: [birth year] Referring Provider:   Flowsheet Row CARDIAC REHAB PHASE II ORIENTATION from 05/19/2023 in [facility] CARDIAC REHABILITATION  Referring Provider [place], Peter MD       Encounter Date: 06/02/2023  Check In:  Session Check In - 06/02/23 1430       Check-In   Supervising physician immediately available to respond to emergencies See telemetry face sheet for immediately available MD    Location [facility]    Staff Present Ross Ludwig, BS, Exercise Physiologist;Jessica [place], MA, RCEP, CCRP, CCET;Hillary Research scientist (life sciences), RN    Virtual Visit No    Medication changes reported     No    Fall or balance concerns reported    Yes    Comments Pt has a had a few falls due to dizziness/lightheadedness, does not use any assistive device    Tobacco Cessation No Change    Warm-up and Cool-down Performed on first and last piece of equipment    Resistance Training Performed Yes    VAD Patient? No    PAD/SET Patient? No      Pain Assessment   Currently in Pain? No/denies    Multiple Pain Sites No             Capillary Blood Glucose: No results found for this or any previous visit (from the past 24 hour(s)).    Social History   Tobacco Use  Smoking Status Former   Current packs/day: 0.00   Types: Cigarettes   Quit date: 08/17/1994   Years since quitting: 28.8  Smokeless Tobacco Never    Goals Met:  Independence with exercise equipment Exercise tolerated well No report of concerns or symptoms today Strength training completed today  Goals Unmet:  Not Applicable  Comments: Pt able to follow exercise prescription today without complaint.  Will continue to monitor for progression.

## 2023-06-04 ENCOUNTER — Encounter (HOSPITAL_COMMUNITY)
Admission: RE | Admit: 2023-06-04 | Discharge: 2023-06-04 | Disposition: A | Discharge status: Home / Self Care | Payer: Medicare HMO | Source: Ambulatory Visit | Attending: Cardiology | Admitting: Cardiology

## 2023-06-04 DIAGNOSIS — Z955 Presence of coronary angioplasty implant and graft: Secondary | ICD-10-CM

## 2023-06-04 NOTE — Progress Notes (Signed)
Daily Session Note  Patient Details  Name: Rachel Odonnell MRN: [id number] Date of Birth: 1956-05-07 Referring Provider:   Flowsheet Row CARDIAC REHAB PHASE II ORIENTATION from 05/19/2023 in [facility] CARDIAC REHABILITATION  Referring Provider [place], Peter MD       Encounter Date: 06/04/2023  Check In:  Session Check In - 06/04/23 1430       Check-In   Supervising physician immediately available to respond to emergencies See telemetry face sheet for immediately available MD    Location [facility]    Staff Present Enid Derry, RN, BSN;Jayli Fogleman, RN;Jessica [place], MA, RCEP, CCRP, CCET    Virtual Visit No    Medication changes reported     No    Fall or balance concerns reported    Yes    Comments Pt has a had a few falls due to dizziness/lightheadedness, does not use any assistive device    Tobacco Cessation No Change    Warm-up and Cool-down Performed on first and last piece of equipment    Resistance Training Performed Yes    VAD Patient? No    PAD/SET Patient? No      Pain Assessment   Currently in Pain? No/denies    Multiple Pain Sites No             Capillary Blood Glucose: No results found for this or any previous visit (from the past 24 hour(s)).    Social History   Tobacco Use  Smoking Status Former   Current packs/day: 0.00   Types: Cigarettes   Quit date: 08/17/1994   Years since quitting: 28.8  Smokeless Tobacco Never    Goals Met:  Independence with exercise equipment Exercise tolerated well No report of concerns or symptoms today Strength training completed today  Goals Unmet:  Not Applicable  Comments: Pt able to follow exercise prescription today without complaint.  Will continue to monitor for progression.

## 2023-06-07 ENCOUNTER — Encounter (HOSPITAL_COMMUNITY)
Admission: RE | Admit: 2023-06-07 | Discharge: 2023-06-07 | Disposition: A | Discharge status: Home / Self Care | Payer: Medicare HMO | Source: Ambulatory Visit | Attending: Cardiology | Admitting: Cardiology

## 2023-06-07 DIAGNOSIS — Z955 Presence of coronary angioplasty implant and graft: Secondary | ICD-10-CM

## 2023-06-07 LAB — GLUCOSE, CAPILLARY: Glucose-Capillary: 255 mg/dL — ABNORMAL HIGH (ref 70–99)

## 2023-06-07 NOTE — Progress Notes (Signed)
Daily Session Note  Patient Details  Name: Rachel Odonnell MRN: [id number] Date of Birth: 05-30-1956 Referring Provider:   Flowsheet Row CARDIAC REHAB PHASE II ORIENTATION from 05/19/2023 in [facility] CARDIAC REHABILITATION  Referring Provider [place], Peter MD       Encounter Date: 06/07/2023  Check In:  Session Check In - 06/07/23 1430       Check-In   Supervising physician immediately available to respond to emergencies See telemetry face sheet for immediately available MD    Location [facility]    Staff Present Ross Ludwig, BS, Exercise Physiologist;Sydell Prowell, RN;Jessica [place], MA, RCEP, CCRP, CCET    Virtual Visit No    Medication changes reported     No    Fall or balance concerns reported    No    Tobacco Cessation No Change    Warm-up and Cool-down Performed on first and last piece of equipment    Resistance Training Performed Yes    VAD Patient? No    PAD/SET Patient? No      Pain Assessment   Currently in Pain? No/denies    Multiple Pain Sites No             Capillary Blood Glucose: No results found for this or any previous visit (from the past 24 hour(s)).    Social History   Tobacco Use  Smoking Status Former   Current packs/day: 0.00   Types: Cigarettes   Quit date: 08/17/1994   Years since quitting: 28.8  Smokeless Tobacco Never    Goals Met:  Independence with exercise equipment Exercise tolerated well No report of concerns or symptoms today Strength training completed today  Goals Unmet:  Not Applicable  Comments: Pt able to follow exercise prescription today without complaint.  Will continue to monitor for progression.

## 2023-06-08 ENCOUNTER — Ambulatory Visit (INDEPENDENT_AMBULATORY_CARE_PROVIDER_SITE_OTHER): Payer: Medicare HMO | Admitting: "Endocrinology

## 2023-06-08 ENCOUNTER — Encounter: Payer: Self-pay | Admitting: "Endocrinology

## 2023-06-08 VITALS — BP 132/52 | HR 60 | Ht 64.0 in | Wt 230.8 lb

## 2023-06-08 DIAGNOSIS — E782 Mixed hyperlipidemia: Secondary | ICD-10-CM

## 2023-06-08 DIAGNOSIS — K760 Fatty (change of) liver, not elsewhere classified: Secondary | ICD-10-CM

## 2023-06-08 DIAGNOSIS — I10 Essential (primary) hypertension: Secondary | ICD-10-CM | POA: Diagnosis not present

## 2023-06-08 DIAGNOSIS — E039 Hypothyroidism, unspecified: Secondary | ICD-10-CM | POA: Diagnosis not present

## 2023-06-08 DIAGNOSIS — E559 Vitamin D deficiency, unspecified: Secondary | ICD-10-CM

## 2023-06-08 DIAGNOSIS — Z794 Long term (current) use of insulin: Secondary | ICD-10-CM

## 2023-06-08 DIAGNOSIS — E1159 Type 2 diabetes mellitus with other circulatory complications: Secondary | ICD-10-CM | POA: Diagnosis not present

## 2023-06-08 MED ORDER — FLUCONAZOLE 150 MG PO TABS: 150.0000 mg | ORAL_TABLET | Freq: Once | ORAL | 0 refills | Status: DC

## 2023-06-08 MED ORDER — NOVOLOG FLEXPEN 100 UNIT/ML ~~LOC~~ SOPN: 14.0000 [IU] | PEN_INJECTOR | Freq: Three times a day (TID) | SUBCUTANEOUS | 0 refills | Status: DC

## 2023-06-08 MED ORDER — EMPAGLIFLOZIN 10 MG PO TABS: 10.0000 mg | ORAL_TABLET | Freq: Every day | ORAL | 1 refills | Status: DC

## 2023-06-08 MED ORDER — FLUCONAZOLE 150 MG PO TABS: 150.0000 mg | ORAL_TABLET | Freq: Once | ORAL | 0 refills | Status: AC

## 2023-06-08 MED ORDER — EMPAGLIFLOZIN 10 MG PO TABS: 10.0000 mg | ORAL_TABLET | Freq: Every day | ORAL | 0 refills | Status: DC

## 2023-06-08 NOTE — Progress Notes (Signed)
06/08/2023, 5:51 PM   Endocrinology follow-up note  Subjective:    Patient ID: Rachel Odonnell, female    DOB: 02-02-1956.  Rachel Odonnell is being seen in follow-up after she was seen in consultation for  management of currently uncontrolled, symptomatic type 2 diabetes requested by  Royann Shivers, PA-C.   Past Medical History:  Diagnosis Date   ADD (attention deficit disorder)    Diabetes mellitus without complication (HCC)    Hypertension     Past Surgical History:  Procedure Laterality Date   ABDOMINAL HYSTERECTOMY     COLONOSCOPY  02/2022   CORONARY PRESSURE/FFR STUDY N/A 04/16/2023   Procedure: CORONARY PRESSURE/FFR STUDY;  Surgeon: Swaziland, Peter M, MD;  Location: [facility] INVASIVE CV LAB;  Service: Cardiovascular;  Laterality: N/A;   CORONARY STENT INTERVENTION N/A 04/16/2023   Procedure: CORONARY STENT INTERVENTION;  Surgeon: Swaziland, Peter M, MD;  Location: [facility] INVASIVE CV LAB;  Service: Cardiovascular;  Laterality: N/A;   CORONARY ULTRASOUND/IVUS N/A 04/16/2023   Procedure: Coronary Ultrasound/IVUS;  Surgeon: Swaziland, Peter M, MD;  Location: [facility] INVASIVE CV LAB;  Service: Cardiovascular;  Laterality: N/A;   EXCISIONAL HEMORRHOIDECTOMY     INCONTINENCE SURGERY     RIGHT/LEFT HEART CATH AND CORONARY ANGIOGRAPHY N/A 06/13/2021   Procedure: RIGHT/LEFT HEART CATH AND CORONARY ANGIOGRAPHY;  Surgeon: Swaziland, Peter M, MD;  Location: [facility] INVASIVE CV LAB;  Service: Cardiovascular;  Laterality: N/A;   RIGHT/LEFT HEART CATH AND CORONARY ANGIOGRAPHY N/A 04/16/2023   Procedure: RIGHT/LEFT HEART CATH AND CORONARY ANGIOGRAPHY;  Surgeon: Swaziland, Peter M, MD;  Location: [facility] INVASIVE CV LAB;  Service: Cardiovascular;  Laterality: N/A;    Social History   Socioeconomic History   Marital status: Married    Spouse name: Shon Hale   Number of children: Not on file   Years of education: Not on file   Highest education  level: Not on file  Occupational History   Not on file  Tobacco Use   Smoking status: Former    Current packs/day: 0.00    Types: Cigarettes    Quit date: 08/17/1994    Years since quitting: 28.8   Smokeless tobacco: Never  Vaping Use   Vaping status: Never Used  Substance and Sexual Activity   Alcohol use: No   Drug use: No   Sexual activity: Yes  Other Topics Concern   Not on file  Social History Narrative   Lives at home with husband, Shon Hale   Former smoker Smoked 2 ppd x 25 yrs; Quit in 1996   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, retail  retired Secondary school teacher, still works part-time at the [employer], used to work Editor, commissioning   Now on fixed income from her SSI and pension   Social Determinants of Health   Financial Resource Strain: Low Risk  (10/10/2021)   Overall Financial Resource Strain (CARDIA)    Difficulty of Paying Living Expenses: Not hard at all  Food Insecurity: No Food Insecurity (04/16/2023)   Hunger Vital Sign    Worried About Running Out of Food in the Last Year: Never true    Ran  Out of Food in the Last Year: Never true  Transportation Needs: No Transportation Needs (04/16/2023)   PRAPARE - Administrator, Civil Service (Medical): No    Lack of Transportation (Non-Medical): No  Physical Activity: Inactive (04/08/2023)   Received from [facility]   Exercise Vital Sign    Days of Exercise per Week: 0 days    Minutes of Exercise per Session: 0 min  Stress: No Stress Concern Present (10/10/2021)   [employer] of Occupational Health - Occupational Stress Questionnaire    Feeling of Stress : Only a little  Social Connections: Socially Integrated (10/10/2021)   Social Connection and Isolation Panel [NHANES]    Frequency of Communication with Friends and Family: More than three times a week    Frequency of Social Gatherings with Friends and Family: More than three times a week    Attends Religious Services: More  than 4 times per year    Active Member of [employer] or Organizations: Yes    Attends Engineer, structural: More than 4 times per year    Marital Status: Married    Family History  Problem Relation Age of Onset   Cancer Mother    Diabetes Father    Heart Problems Father    COPD Father    Emphysema Father    Heart block Father    Congestive Heart Failure Father     Outpatient Encounter Medications as of 06/08/2023  Medication Sig   [DISCONTINUED] empagliflozin (JARDIANCE) 10 MG TABS tablet Take 1 tablet (10 mg total) by mouth daily before breakfast.   [DISCONTINUED] fluconazole (DIFLUCAN) 150 MG tablet Take 1 tablet (150 mg total) by mouth once for 1 dose.   Accu-Chek Softclix Lancets lancets Use as instructed to test blood glucose four times daily   busPIRone (BUSPAR) 5 MG tablet Take 5 mg by mouth at bedtime.   Cholecalciferol (VITAMIN D3) 125 MCG (5000 UT) CAPS Take 1 capsule (5,000 Units total) by mouth daily.   citalopram (CELEXA) 20 MG tablet Take 1 tablet (20 mg total) by mouth every evening.   clopidogrel (PLAVIX) 75 MG tablet Take 1 tablet (75 mg total) by mouth daily.   Continuous Blood Gluc Sensor (FREESTYLE LIBRE 2 SENSOR) MISC Change sensor every 14 days. Use to check glucose continuously   Continuous Glucose Receiver (FREESTYLE LIBRE 2 READER) DEVI USE TO CHECK GLUCOSE AS DIRECTED   donepezil (ARICEPT) 5 MG tablet Take 5 mg by mouth every evening.   empagliflozin (JARDIANCE) 10 MG TABS tablet Take 1 tablet (10 mg total) by mouth daily before breakfast.   fenofibrate (TRICOR) 145 MG tablet Take 145 mg by mouth every evening.   fluconazole (DIFLUCAN) 150 MG tablet Take 1 tablet (150 mg total) by mouth once for 1 dose.   gabapentin (NEURONTIN) 100 MG capsule Take 1 capsule (100 mg total) by mouth at bedtime.   glucose blood test strip 1 each by Other route as needed. Use as instructed to test blood glucose four times daily   indapamide (LOZOL) 2.5 MG tablet Take 1  tablet (2.5 mg total) by mouth daily.   insulin aspart (NOVOLOG FLEXPEN) 100 UNIT/ML FlexPen Inject 14-20 Units into the skin 3 (three) times daily before meals.   insulin degludec (TRESIBA FLEXTOUCH) 200 UNIT/ML FlexTouch Pen Inject 100 Units into the skin at bedtime.   Insulin Pen Needle (B-D ULTRAFINE III SHORT PEN) 31G X 8 MM MISC 1 each by Does not apply route as directed.  isosorbide mononitrate (IMDUR) 60 MG 24 hr tablet Take 1.5 tablets (90 mg total) by mouth daily. Can take an additional half tab as needed for chest pain   levocetirizine (XYZAL) 5 MG tablet Take 5 mg by mouth every evening.   levothyroxine (SYNTHROID) 50 MCG tablet TAKE 1 TABLET BY MOUTH ONCE DAILY BEFORE BREAKFAST   metFORMIN (GLUCOPHAGE) 500 MG tablet TAKE 1 TABLET TWICE DAILY WITH MEALS   mirabegron ER (MYRBETRIQ) 25 MG TB24 tablet Take 1 tablet (25 mg total) by mouth daily.   modafinil (PROVIGIL) 100 MG tablet Take 1 tablet (100 mg total) by mouth daily.   montelukast (SINGULAIR) 10 MG tablet Take 10 mg by mouth at bedtime.   Multiple Vitamin (MULTIVITAMIN ADULT PO) Take 1 tablet by mouth daily.   nitroGLYCERIN (NITROSTAT) 0.4 MG SL tablet Place 1 tablet (0.4 mg total) under the tongue every 5 (five) minutes as needed for chest pain.   OVER THE COUNTER MEDICATION Take 2 capsules by mouth daily. Omega XL   pantoprazole (PROTONIX) 40 MG tablet Take 2 tablets (80 mg total) by mouth daily.   rosuvastatin (CRESTOR) 40 MG tablet Take 1 tablet (40 mg total) by mouth daily.   warfarin (COUMADIN) 5 MG tablet TAKE 1/2 TO 1 TABLET BY MOUTH DAILY AS DIRECTED BY THE COUMADIN CLINIC.   [DISCONTINUED] aspirin EC 81 MG tablet Take 1 tablet (81 mg total) by mouth daily. Swallow whole. Take for 30 days total then stop. Last dose 05/15/23.   [DISCONTINUED] glipiZIDE (GLUCOTROL) 5 MG tablet Take 1 tablet (5 mg total) by mouth daily before breakfast.   [DISCONTINUED] insulin aspart (NOVOLOG FLEXPEN) 100 UNIT/ML FlexPen Inject 10-16 Units  into the skin 3 (three) times daily before meals. (Patient taking differently: Inject 10-16 Units into the skin 3 (three) times daily before meals. Sliding scale 90-150=10 units 151-200=11 units 201-250=12 units 251-300=13 units 301-400=14 units 400=16 units Over 400 call MD)   No facility-administered encounter medications on file as of 06/08/2023.    ALLERGIES: Allergies  Allergen Reactions   Azithromycin Hives   Codeine Nausea Only   Atomoxetine Nausea And Vomiting   Atorvastatin Other (See Comments)    Restless legs   Lisinopril Nausea And Vomiting    VACCINATION STATUS: Immunization History  Administered Date(s) Administered   Fluad Quad(high Dose 65+) 05/14/2021   Moderna SARS-COV2 Booster Vaccination 06/13/2020   Moderna Sars-Covid-2 Vaccination 09/06/2019, 10/04/2019   Pneumococcal Polysaccharide-23 04/22/2017   Pneumococcal-Unspecified 05/31/2020   Tdap 04/22/2017   Zoster Recombinant(Shingrix) 11/30/2017, 02/20/2018    Diabetes She presents for her follow-up diabetic visit. She has type 2 diabetes mellitus. Onset time: Patient was diagnosed at approximate age of 49 years. Her disease course has been worsening. There are no hypoglycemic associated symptoms. Pertinent negatives for hypoglycemia include no confusion, headaches, pallor or seizures. Pertinent negatives for diabetes include no blurred vision, no chest pain, no fatigue, no polydipsia, no polyphagia and no polyuria. There are no hypoglycemic complications. Symptoms are worsening. Diabetic complications include heart disease and peripheral neuropathy. Risk factors for coronary artery disease include dyslipidemia, diabetes mellitus, family history, obesity, post-menopausal, sedentary lifestyle, tobacco exposure and hypertension. Current diabetic treatment includes insulin injections and oral agent (dual therapy). Her weight is fluctuating minimally. She is following a generally unhealthy diet. When asked about  meal planning, she reported none. She never participates in exercise. Her home blood glucose trend is increasing steadily. Her breakfast blood glucose range is generally >200 mg/dl. Her lunch blood glucose range is generally >  200 mg/dl. Her dinner blood glucose range is generally >200 mg/dl. Her bedtime blood glucose range is generally >200 mg/dl. Her overall blood glucose range is >200 mg/dl. (She presents with her CGM, showing significant loss of control of her glycemia.  Her AGP report shows 42% time range, 25% level 1 hyperglycemia, 73% level 2 hyperglycemia.  Her average blood glucose is 293.  Her recent A1c was 11.4% increasing from 10.2%. She did not document significant hypoglycemia.  ) An ACE inhibitor/angiotensin II receptor blocker is not being taken. Eye exam is current.  Hyperlipidemia This is a chronic problem. The current episode started more than 1 year ago. The problem is uncontrolled. Exacerbating diseases include diabetes, hypothyroidism and obesity. Pertinent negatives include no chest pain, myalgias or shortness of breath. Current antihyperlipidemic treatment includes statins, fibric acid derivatives and bile acid sequestrants. Risk factors for coronary artery disease include family history, dyslipidemia, obesity, a sedentary lifestyle, post-menopausal, hypertension and diabetes mellitus.  Hypertension This is a chronic problem. The current episode started more than 1 year ago. Pertinent negatives include no blurred vision, chest pain, headaches, palpitations or shortness of breath. Risk factors for coronary artery disease include dyslipidemia, diabetes mellitus, family history, obesity, post-menopausal state, smoking/tobacco exposure and sedentary lifestyle. Past treatments include direct vasodilators. Hypertensive end-organ damage includes CAD/MI.    Review of systems  Constitutional: + Minimally fluctuating body weight ,  current Body mass index is 39.62 kg/m. , no fatigue, no  subjective hyperthermia, no subjective hypothermia Eyes: no blurry vision, no xerophthalmia    Objective:    BP (!) 132/52   Pulse 60   Ht 5\' 4"  (1.626 m)   Wt 230 lb 12.8 oz (104.7 kg)   BMI 39.62 kg/m   Wt Readings from Last 3 Encounters:  06/08/23 230 lb 12.8 oz (104.7 kg)  05/25/23 225 lb (102.1 kg)  05/19/23 228 lb 13.4 oz (103.8 kg)    BP Readings from Last 3 Encounters:  06/08/23 (!) 132/52  05/25/23 130/70  05/19/23 138/72     Physical Exam- Limited  Constitutional:  Body mass index is 39.62 kg/m. , not in acute distress, normal state of mind Eyes:  EOMI, no exophthalmos    CMP ( most recent) CMP     Component Value Date/Time   NA 133 (L) 04/17/2023 0500   NA 136 04/12/2023 1211   K 4.0 04/17/2023 0500   CL 95 (L) 04/17/2023 0500   CO2 25 04/17/2023 0500   GLUCOSE 322 (H) 04/17/2023 0500   BUN 13 04/17/2023 0500   BUN 15 04/12/2023 1211   CREATININE 1.16 (H) 04/17/2023 0500   CALCIUM 10.3 04/17/2023 0500   PROT 7.0 03/16/2023 1634   ALBUMIN 4.2 03/16/2023 1634   AST 33 03/16/2023 1634   ALT 30 03/16/2023 1634   ALKPHOS 79 03/16/2023 1634   BILITOT 0.3 03/16/2023 1634   GFRNONAA 52 (L) 04/17/2023 0500   GFRAA >90 07/09/2012 1736    Recent Results (from the past 2160 hour(s))  Hepatic function panel     Status: None   Collection Time: 03/16/23  4:34 PM  Result Value Ref Range   Total Protein 7.0 6.0 - 8.5 g/dL   Albumin 4.2 3.9 - 4.9 g/dL   Bilirubin Total 0.3 0.0 - 1.2 mg/dL   Bilirubin, Direct 5.28 0.00 - 0.40 mg/dL   Alkaline Phosphatase 79 44 - 121 IU/L   AST 33 0 - 40 IU/L   ALT 30 0 - 32 IU/L  ECHOCARDIOGRAM COMPLETE  Status: None   Collection Time: 03/16/23  4:48 PM  Result Value Ref Range   Area-P 1/2 3.34 cm2   S' Lateral 2.80 cm   AV Area mean vel 1.38 cm2   AR max vel 1.36 cm2   AV Area VTI 1.55 cm2   Ao pk vel 2.32 m/s   AV Mean grad 12.0 mmHg   AV Peak grad 21.4 mmHg   Est EF 60 - 65%   POCT INR     Status: Abnormal    Collection Time: 03/23/23  2:58 PM  Result Value Ref Range   INR 3.4 (A) 2.0 - 3.0   POC INR    Basic metabolic panel     Status: Abnormal   Collection Time: 04/12/23 12:11 PM  Result Value Ref Range   Glucose 279 (H) 70 - 99 mg/dL   BUN 15 8 - 27 mg/dL   Creatinine, Ser 0.10 0.57 - 1.00 mg/dL   eGFR 64 >27 OZ/DGU/4.40   BUN/Creatinine Ratio 15 12 - 28   Sodium 136 134 - 144 mmol/L   Potassium 4.0 3.5 - 5.2 mmol/L   Chloride 94 (L) 96 - 106 mmol/L   CO2 25 20 - 29 mmol/L   Calcium 10.4 (H) 8.7 - 10.3 mg/dL  CBC     Status: None   Collection Time: 04/12/23 12:11 PM  Result Value Ref Range   WBC 7.9 3.4 - 10.8 x10E3/uL   RBC 4.41 3.77 - 5.28 x10E6/uL   Hemoglobin 13.8 11.1 - 15.9 g/dL   Hematocrit 34.7 42.5 - 46.6 %   MCV 96 79 - 97 fL   MCH 31.3 26.6 - 33.0 pg   MCHC 32.6 31.5 - 35.7 g/dL   RDW 95.6 38.7 - 56.4 %   Platelets 260 150 - 450 x10E3/uL  Urinalysis, Routine w reflex microscopic     Status: Abnormal   Collection Time: 04/13/23  3:34 PM  Result Value Ref Range   Specific Gravity, UA 1.025 1.005 - 1.030   pH, UA 6.0 5.0 - 7.5   Color, UA Yellow Yellow   Appearance Ur Clear Clear   Leukocytes,UA 1+ (A) Negative   Protein,UA Trace Negative/Trace   Glucose, UA 1+ (A) Negative   Ketones, UA Negative Negative   RBC, UA 3+ (A) Negative   Bilirubin, UA Negative Negative   Urobilinogen, Ur 4.0 (H) 0.2 - 1.0 mg/dL   Nitrite, UA Negative Negative   Microscopic Examination See below:     Comment: Microscopic was indicated and was performed.  Microscopic Examination     Status: Abnormal   Collection Time: 04/13/23  3:34 PM   Urine  Result Value Ref Range   WBC, UA 6-10 (A) 0 - 5 /hpf   RBC, Urine >30 (A) 0 - 2 /hpf   Epithelial Cells (non renal) 0-10 0 - 10 /hpf   Crystals Present (A) N/A   Crystal Type Amorphous Sediment N/A   Bacteria, UA Many (A) None seen/Few  BLADDER SCAN AMB NON-IMAGING     Status: None   Collection Time: 04/13/23  3:43 PM  Result Value  Ref Range   Scan Result 0   Urine Culture     Status: Abnormal   Collection Time: 04/13/23  4:20 PM   Specimen: Urine   UR  Result Value Ref Range   Urine Culture, Routine Final report (A)    Organism ID, Bacteria Escherichia coli (A)     Comment: Cefazolin <=4 ug/mL Cefazolin with an  MIC <=16 predicts susceptibility to the oral agents cefaclor, cefdinir, cefpodoxime, cefprozil, cefuroxime, cephalexin, and loracarbef when used for therapy of uncomplicated urinary tract infections due to E. coli, Klebsiella pneumoniae, and Proteus mirabilis. Greater than 100,000 colony forming units per mL    Antimicrobial Susceptibility Comment     Comment:       ** S = Susceptible; I = Intermediate; R = Resistant **                    P = Positive; N = Negative             MICS are expressed in micrograms per mL    Antibiotic                 RSLT#1    RSLT#2    RSLT#3    RSLT#4 Amoxicillin/Clavulanic Acid    S Ampicillin                     S Cefepime                       S Ceftriaxone                    S Cefuroxime                     S Ciprofloxacin                  S Ertapenem                      S Gentamicin                     S Imipenem                       S Levofloxacin                   S Meropenem                      S Nitrofurantoin                 S Piperacillin/Tazobactam        S Tetracycline                   S Tobramycin                     S Trimethoprim/Sulfa             S   Protime-INR     Status: Abnormal   Collection Time: 04/16/23  7:00 AM  Result Value Ref Range   Prothrombin Time 15.3 (H) 11.4 - 15.2 seconds   INR 1.2 0.8 - 1.2    Comment: (NOTE) INR goal varies based on device and disease states. Performed at [facility], [address], [place], [place] [address]   Glucose, capillary     Status: Abnormal   Collection Time: 04/16/23  7:19 AM  Result Value Ref Range   Glucose-Capillary 287 (H) 70 - 99 mg/dL    Comment: Glucose reference range  applies only to samples taken after fasting for at least 8 hours.   Comment 1 Notify RN    Comment 2 Document in Chart   I-STAT 7, (LYTES, BLD GAS, ICA, H+H)  Status: Abnormal   Collection Time: 04/16/23  9:06 AM  Result Value Ref Range   pH, Arterial 7.375 7.35 - 7.45   pCO2 arterial 41.9 32 - 48 mmHg   pO2, Arterial 90 83 - 108 mmHg   Bicarbonate 24.6 20.0 - 28.0 mmol/L   TCO2 26 22 - 32 mmol/L   O2 Saturation 97 %   Acid-base deficit 1.0 0.0 - 2.0 mmol/L   Sodium 136 135 - 145 mmol/L   Potassium 4.1 3.5 - 5.1 mmol/L   Calcium, Ion 1.28 1.15 - 1.40 mmol/L   HCT 35.0 (L) 36.0 - 46.0 %   Hemoglobin 11.9 (L) 12.0 - 15.0 g/dL   Sample type ARTERIAL   POCT I-Stat EG7     Status: None   Collection Time: 04/16/23  9:10 AM  Result Value Ref Range   pH, Ven 7.322 7.25 - 7.43   pCO2, Ven 52.5 44 - 60 mmHg   pO2, Ven 36 32 - 45 mmHg   Bicarbonate 27.2 20.0 - 28.0 mmol/L   TCO2 29 22 - 32 mmol/L   O2 Saturation 63 %   Acid-Base Excess 0.0 0.0 - 2.0 mmol/L   Sodium 137 135 - 145 mmol/L   Potassium 4.2 3.5 - 5.1 mmol/L   Calcium, Ion 1.32 1.15 - 1.40 mmol/L   HCT 36.0 36.0 - 46.0 %   Hemoglobin 12.2 12.0 - 15.0 g/dL   Sample type VENOUS    Comment NOTIFIED PHYSICIAN   POCT I-Stat EG7     Status: None   Collection Time: 04/16/23  9:11 AM  Result Value Ref Range   pH, Ven 7.315 7.25 - 7.43   pCO2, Ven 53.1 44 - 60 mmHg   pO2, Ven 36 32 - 45 mmHg   Bicarbonate 27.1 20.0 - 28.0 mmol/L   TCO2 29 22 - 32 mmol/L   O2 Saturation 62 %   Acid-Base Excess 0.0 0.0 - 2.0 mmol/L   Sodium 136 135 - 145 mmol/L   Potassium 4.2 3.5 - 5.1 mmol/L   Calcium, Ion 1.34 1.15 - 1.40 mmol/L   HCT 36.0 36.0 - 46.0 %   Hemoglobin 12.2 12.0 - 15.0 g/dL   Sample type VENOUS   POCT Activated clotting time     Status: None   Collection Time: 04/16/23  9:29 AM  Result Value Ref Range   Activated Clotting Time 324 seconds    Comment: Reference range 74-137 seconds for patients not on anticoagulant  therapy.  POCT Activated clotting time     Status: None   Collection Time: 04/16/23  9:55 AM  Result Value Ref Range   Activated Clotting Time 470 seconds    Comment: Reference range 74-137 seconds for patients not on anticoagulant therapy.  POCT Activated clotting time     Status: None   Collection Time: 04/16/23 10:30 AM  Result Value Ref Range   Activated Clotting Time 232 seconds    Comment: Reference range 74-137 seconds for patients not on anticoagulant therapy.  Glucose, capillary     Status: Abnormal   Collection Time: 04/16/23 11:18 AM  Result Value Ref Range   Glucose-Capillary 278 (H) 70 - 99 mg/dL    Comment: Glucose reference range applies only to samples taken after fasting for at least 8 hours.  Hemoglobin A1c     Status: Abnormal   Collection Time: 04/16/23  2:53 PM  Result Value Ref Range   Hgb A1c MFr Bld 11.4 (H) 4.8 - 5.6 %  Comment: (NOTE)         Prediabetes: 5.7 - 6.4         Diabetes: >6.4         Glycemic control for adults with diabetes: <7.0    Mean Plasma Glucose 280 mg/dL    Comment: (NOTE) Performed At: [facility] [address] [place], [place] [id number] Jolene Schimke MD [id number]   CBC     Status: Abnormal   Collection Time: 04/16/23  2:53 PM  Result Value Ref Range   WBC 6.4 4.0 - 10.5 K/uL   RBC 3.84 (L) 3.87 - 5.11 MIL/uL   Hemoglobin 12.2 12.0 - 15.0 g/dL   HCT 85.2 77.8 - 24.2 %   MCV 96.4 80.0 - 100.0 fL   MCH 31.8 26.0 - 34.0 pg   MCHC 33.0 30.0 - 36.0 g/dL   RDW 35.3 61.4 - 43.1 %   Platelets 254 150 - 400 K/uL   nRBC 0.0 0.0 - 0.2 %    Comment: Performed at [facility], [address], [place], [place] [address]  Creatinine, serum     Status: Abnormal   Collection Time: 04/16/23  2:53 PM  Result Value Ref Range   Creatinine, Ser 1.10 (H) 0.44 - 1.00 mg/dL   GFR, Estimated 55 (L) >60 mL/min    Comment: (NOTE) Calculated using the CKD-EPI Creatinine Equation (2021) Performed at [facility],  [address] [place], [place] [address]   Glucose, capillary     Status: Abnormal   Collection Time: 04/16/23  3:48 PM  Result Value Ref Range   Glucose-Capillary 388 (H) 70 - 99 mg/dL    Comment: Glucose reference range applies only to samples taken after fasting for at least 8 hours.  Glucose, capillary     Status: Abnormal   Collection Time: 04/16/23  7:16 PM  Result Value Ref Range   Glucose-Capillary 297 (H) 70 - 99 mg/dL    Comment: Glucose reference range applies only to samples taken after fasting for at least 8 hours.  Glucose, capillary     Status: Abnormal   Collection Time: 04/16/23  9:28 PM  Result Value Ref Range   Glucose-Capillary 286 (H) 70 - 99 mg/dL    Comment: Glucose reference range applies only to samples taken after fasting for at least 8 hours.  Basic metabolic panel     Status: Abnormal   Collection Time: 04/17/23  5:00 AM  Result Value Ref Range   Sodium 133 (L) 135 - 145 mmol/L   Potassium 4.0 3.5 - 5.1 mmol/L   Chloride 95 (L) 98 - 111 mmol/L   CO2 25 22 - 32 mmol/L   Glucose, Bld 322 (H) 70 - 99 mg/dL    Comment: Glucose reference range applies only to samples taken after fasting for at least 8 hours.   BUN 13 8 - 23 mg/dL   Creatinine, Ser 5.09 (H) 0.44 - 1.00 mg/dL   Calcium 32.6 8.9 - 71.2 mg/dL   GFR, Estimated 52 (L) >60 mL/min    Comment: (NOTE) Calculated using the CKD-EPI Creatinine Equation (2021)    Anion gap 13 5 - 15    Comment: Performed at [facility], [address] [place], [place] [address]  CBC     Status: None   Collection Time: 04/17/23  5:00 AM  Result Value Ref Range   WBC 7.1 4.0 - 10.5 K/uL   RBC 4.06 3.87 - 5.11 MIL/uL  Hemoglobin 12.6 12.0 - 15.0 g/dL   HCT 29.5 28.4 - 13.2 %   MCV 93.1 80.0 - 100.0 fL   MCH 31.0 26.0 - 34.0 pg   MCHC 33.3 30.0 - 36.0 g/dL   RDW 44.0 10.2 - 72.5 %   Platelets 250 150 - 400 K/uL   nRBC 0.0 0.0 - 0.2 %    Comment: Performed at [facility], [address],  [place], [place] [address]  Lipoprotein A (LPA)     Status: Abnormal   Collection Time: 04/17/23  5:00 AM  Result Value Ref Range   Lipoprotein (a) 148.0 (H) <75.0 nmol/L    Comment: (NOTE) Note:  Values greater than or equal to 75.0 nmol/L may       indicate an independent risk factor for CHD,       but must be evaluated with caution when applied       to non-Caucasian populations due to the       influence of genetic factors on Lp(a) across       ethnicities. Performed At: [facility] [address] [place], [place] [id number] Jolene Schimke MD [id number]   Glucose, capillary     Status: Abnormal   Collection Time: 04/17/23  7:33 AM  Result Value Ref Range   Glucose-Capillary 292 (H) 70 - 99 mg/dL    Comment: Glucose reference range applies only to samples taken after fasting for at least 8 hours.  Glucose, capillary     Status: Abnormal   Collection Time: 04/17/23 11:28 AM  Result Value Ref Range   Glucose-Capillary 304 (H) 70 - 99 mg/dL    Comment: Glucose reference range applies only to samples taken after fasting for at least 8 hours.  POCT INR     Status: Abnormal   Collection Time: 04/20/23  3:49 PM  Result Value Ref Range   INR 1.2 (A) 2.0 - 3.0   POC INR    POCT INR     Status: Abnormal   Collection Time: 04/26/23  3:32 PM  Result Value Ref Range   INR 1.8 (A) 2.0 - 3.0   POC INR    POCT INR     Status: Abnormal   Collection Time: 05/12/23  3:18 PM  Result Value Ref Range   INR 4.0 (A) 2.0 - 3.0   POC INR    POCT INR     Status: Normal   Collection Time: 05/25/23  3:50 PM  Result Value Ref Range   INR 2.0 2.0 - 3.0   POC INR    Glucose, capillary     Status: Abnormal   Collection Time: 05/31/23  2:37 PM  Result Value Ref Range   Glucose-Capillary 286 (H) 70 - 99 mg/dL    Comment: Glucose reference range applies only to samples taken after fasting for at least 8 hours.  Glucose, capillary     Status: Abnormal   Collection Time: 05/31/23  3:36 PM   Result Value Ref Range   Glucose-Capillary 230 (H) 70 - 99 mg/dL    Comment: Glucose reference range applies only to samples taken after fasting for at least 8 hours.  Glucose, capillary     Status: Abnormal   Collection Time: 06/04/23  2:35 PM  Result Value Ref Range   Glucose-Capillary 255 (H) 70 - 99 mg/dL    Comment: Glucose reference range applies only to samples taken after fasting for at least 8 hours.     Assessment &  Plan:   1) DM type 2 causing vascular disease (HCC)  - JAZMENE HULING has currently uncontrolled symptomatic type 2 DM since  66 years of age.  She presents with her CGM, showing significant loss of control of her glycemia.  Her AGP report shows 42% time range, 25% level 1 hyperglycemia, 73% level 2 hyperglycemia.  Her average blood glucose is 293.  Her recent A1c was 11.4% increasing from 10.2%. She did not document significant hypoglycemia.   Recent labs reviewed.  - I had a long discussion with her about the progressive nature of diabetes and the pathology behind its complications. -her diabetes is complicated by coronary artery disease, peripheral neuropathy, obesity, sedentary life, nonalcoholic fatty liver disease and she remains at extremely  high risk for more acute and chronic complications which include CAD, CVA, CKD, retinopathy, and neuropathy. These are all discussed in detail with her.  - I discussed all available options of managing her diabetes including de-escalation of medications.  Considering her associated comorbidities including sleep apnea requiring CPAP, nonalcoholic fatty liver disease, hypertension, hyperlipidemia, she is a perfect candidate for lifestyle medicine.    I have counseled her on diet  and weight management  by adopting a Whole Food , Plant Predominant  ( WFPP) nutrition as recommended by [employer] of Lifestyle Medicine. Patient is encouraged to switch to  unprocessed or minimally processed  complex starch, adequate  protein intake (mainly plant source), minimal liquid fat ( mainly vegetable oils), plenty of fruits, and vegetables. -  she is advised to stick to a routine mealtimes to eat 3 complete meals a day and snack only when necessary ( to snack only to correct hypoglycemia BG <70 day time or <100 at night).   -Her engagement with lifestyle medicine nutrition is suboptimal, however,  she acknowledges that there is a room for improvement in her food and drink choices.  - she acknowledges that there is a room for improvement in her food and drink choices. - Suggestion is made for her to avoid simple carbohydrates  from her diet including Cakes, Sweet Desserts, Ice Cream, Soda (diet and regular), Sweet Tea, Candies, Chips, Cookies, Store Bought Juices, Alcohol , Artificial Sweeteners,  Coffee Creamer, and "Sugar-free" Products, Lemonade. This will help patient to have more stable blood glucose profile and potentially avoid unintended weight gain.  The following Lifestyle Medicine recommendations according to American College of Lifestyle Medicine  [facility]) were discussed and and offered to patient and she  agrees to start the journey:  A. Whole Foods, Plant-Based Nutrition comprising of fruits and vegetables, plant-based proteins, whole-grain carbohydrates was discussed in detail with the patient.   A list for source of those nutrients were also provided to the patient.  Patient will use only water or unsweetened tea for hydration. B.  The need to stay away from risky substances including alcohol, smoking; obtaining 7 to 9 hours of restorative sleep, at least 150 minutes of moderate intensity exercise weekly, the importance of healthy social connections,  and stress management techniques were discussed. C.  A full color page of  Calorie density of various food groups per pound showing examples of each food groups was provided to the patient.  - she has been scheduled with Norm Salt, RDN, CDE for individualized  diabetes education.  - I have approached her with the following plan to manage  her diabetes and patient agrees:   -She he is accompanied by her husband to clinic today.  I had  a long discussion with the family about her uncontrolled diabetes posing a very serious risk for acute and chronic complications of diabetes including cardiovascular disease.   -Insulin injection techniques were advised with her.  She is advised to rotate injection sites.    - She is advised to continue Tresiba 100 units nightly, increase NovoLog to 14-20 units 3 times daily is for Premeal blood glucose readings above 90 mg per DL.  She is advised to continue metformin 500 mg p.o. daily. -She would like to try Jardiance again.  I discussed and prescribed Jardiance 10 mg p.o. daily at breakfast.  She is advised to continue metformin 500 mg p.o. twice daily.  She is advised to discontinue glipizide at this time.  -She will not afford the co-pays of Ozempic if no she was benefiting from it.    - Specific targets for  A1c;  LDL, HDL,  and Triglycerides were discussed with the patient.  2) Blood Pressure /Hypertension:   -Her blood pressure is controlled to target.  she is advised to continue her current medications including Imdur 60 mg p.o. daily with breakfast .  3) Lipids/Hyperlipidemia:    Her most recent lipid panel showed improved LDL to 91 from 120.    She is advised to continue on her current triple agent therapy including fenofibrate 145 mg p.o. daily, 35 acids 1000 mg p.o. twice daily, Crestor 10 mg p.o. daily at bedtime.   She has a chance to simplify this treatment with proper engagement in lifestyle medicine.  She is hesitant to engage with lifestyle medicine for now.  Side effects and precautions discussed with her.  4)  Weight/Diet:   Her Body mass index is 39.62 kg/m.  -   clearly complicating her diabetes care.   she is  a candidate for weight loss. I discussed with her the fact that loss of 5 - 10% of her   current body weight will have the most impact on her diabetes management.  The above detailed  ACLM recommendations for nutrition, exercise, sleep, social life, avoidance of risky substances, the need for restorative sleep   information will also detailed on discharge instructions.  5) Hypothyroidism:  The circumstances of her diagnosis are not available to review.  Her recent labs were consistent with appropriate replacement.  She is advised to continue levothyroxine 50 mcg p.o. daily before breakfast.    - We discussed about the correct intake of her thyroid hormone, on empty stomach at fasting, with water, separated by at least 30 minutes from breakfast and other medications,  and separated by more than 4 hours from calcium, iron, multivitamins, acid reflux medications (PPIs). -Patient is made aware of the fact that thyroid hormone replacement is needed for life, dose to be adjusted by periodic monitoring of thyroid function tests.   6) Chronic Care/Health Maintenance: -she is on Statin medications and  is encouraged to initiate and continue to follow up with Ophthalmology, Dentist,  Podiatrist at least yearly or according to recommendations, and advised to   stay away from smoking. I have recommended yearly flu vaccine and pneumonia vaccine at least every 5 years; moderate intensity exercise for up to 150 minutes weekly; and  sleep for 7- 9 hours a day.  -She will need to have vitamin D supplement with vitamin D3 5000 units daily x90 days.  - she is advised to maintain close follow up with Royann Shivers, PA-C for primary care needs, as well as her other providers for  optimal and coordinated care.   I spent  42  minutes in the care of the patient today including review of labs from CMP, Lipids, Thyroid Function, Hematology (current and previous including abstractions from other facilities); face-to-face time discussing  her blood glucose readings/logs, discussing hypoglycemia and  hyperglycemia episodes and symptoms, medications doses, her options of short and long term treatment based on the latest standards of care / guidelines;  discussion about incorporating lifestyle medicine;  and documenting the encounter. Risk reduction counseling performed per USPSTF guidelines to reduce  obesity and cardiovascular risk factors.     Please refer to Patient Instructions for Blood Glucose Monitoring and Insulin/Medications Dosing Guide"  in media tab for additional information. Please  also refer to " Patient Self Inventory" in the Media  tab for reviewed elements of pertinent patient history.  Alease Kantner Tardif participated in the discussions, expressed understanding, and voiced agreement with the above plans.  All questions were answered to her satisfaction. she is encouraged to contact clinic should she have any questions or concerns prior to her return visit.    Follow up plan: - Return in about 4 weeks (around 07/06/2023) for F/U with Meter/CGM Megan Salon Only - no Labs.  Ronny Bacon, [facility] [address] [place], [place] [address] Phone: [phone] Fax: [phone]  06/08/2023, 5:51 PM

## 2023-06-08 NOTE — Patient Instructions (Signed)

## 2023-06-09 ENCOUNTER — Encounter (HOSPITAL_COMMUNITY)
Admission: RE | Admit: 2023-06-09 | Discharge: 2023-06-09 | Disposition: A | Discharge status: Home / Self Care | Payer: Medicare HMO | Source: Ambulatory Visit | Attending: Cardiology

## 2023-06-09 ENCOUNTER — Other Ambulatory Visit: Payer: Self-pay | Admitting: Nurse Practitioner

## 2023-06-09 DIAGNOSIS — Z955 Presence of coronary angioplasty implant and graft: Secondary | ICD-10-CM

## 2023-06-09 MED ORDER — CITALOPRAM HYDROBROMIDE 20 MG PO TABS: 20.0000 mg | ORAL_TABLET | Freq: Every evening | ORAL | 4 refills | Status: AC

## 2023-06-09 MED ORDER — PANTOPRAZOLE SODIUM 40 MG PO TBEC: 80.0000 mg | DELAYED_RELEASE_TABLET | Freq: Every day | ORAL | 4 refills | Status: DC

## 2023-06-09 NOTE — Progress Notes (Signed)
Daily Session Note  Patient Details  Name: Rachel Odonnell MRN: [id number] Date of Birth: 04-24-1956 Referring Provider:   Flowsheet Row CARDIAC REHAB PHASE II ORIENTATION from 05/19/2023 in [facility]  Referring Provider [place], Peter MD       Encounter Date: 06/09/2023  Check In:  Session Check In - 06/09/23 1400       Check-In   Supervising physician immediately available to respond to emergencies See telemetry face sheet for immediately available MD    Location [facility]    Staff Present Ross Ludwig, BS, Exercise Physiologist;Juandiego Kolenovic Leonidas Romberg BSN, RN    Virtual Visit No    Medication changes reported     No    Fall or balance concerns reported    Yes    Comments Pt has a had a few falls due to dizziness/lightheadedness, does not use any assistive device    Tobacco Cessation No Change    Warm-up and Cool-down Performed on first and last piece of equipment    Resistance Training Performed Yes    VAD Patient? No    PAD/SET Patient? No      Pain Assessment   Currently in Pain? No/denies    Multiple Pain Sites No             Capillary Blood Glucose: No results found for this or any previous visit (from the past 24 hour(s)).    Social History   Tobacco Use  Smoking Status Former   Current packs/day: 0.00   Types: Cigarettes   Quit date: 08/17/1994   Years since quitting: 28.8  Smokeless Tobacco Never    Goals Met:  Independence with exercise equipment Exercise tolerated well No report of concerns or symptoms today Strength training completed today  Goals Unmet:  Not Applicable  Comments: Marland KitchenMarland KitchenPt able to follow exercise prescription today without complaint.  Will continue to monitor for progression.

## 2023-06-10 DIAGNOSIS — M9902 Segmental and somatic dysfunction of thoracic region: Secondary | ICD-10-CM | POA: Diagnosis not present

## 2023-06-10 DIAGNOSIS — S233XXA Sprain of ligaments of thoracic spine, initial encounter: Secondary | ICD-10-CM | POA: Diagnosis not present

## 2023-06-10 DIAGNOSIS — S338XXA Sprain of other parts of lumbar spine and pelvis, initial encounter: Secondary | ICD-10-CM | POA: Diagnosis not present

## 2023-06-10 DIAGNOSIS — M9901 Segmental and somatic dysfunction of cervical region: Secondary | ICD-10-CM | POA: Diagnosis not present

## 2023-06-10 DIAGNOSIS — M9903 Segmental and somatic dysfunction of lumbar region: Secondary | ICD-10-CM | POA: Diagnosis not present

## 2023-06-10 DIAGNOSIS — S134XXA Sprain of ligaments of cervical spine, initial encounter: Secondary | ICD-10-CM | POA: Diagnosis not present

## 2023-06-11 ENCOUNTER — Encounter (HOSPITAL_COMMUNITY): Payer: Medicare HMO

## 2023-06-11 DIAGNOSIS — I480 Paroxysmal atrial fibrillation: Secondary | ICD-10-CM | POA: Diagnosis not present

## 2023-06-11 DIAGNOSIS — E785 Hyperlipidemia, unspecified: Secondary | ICD-10-CM | POA: Diagnosis not present

## 2023-06-11 DIAGNOSIS — I4821 Permanent atrial fibrillation: Secondary | ICD-10-CM | POA: Diagnosis not present

## 2023-06-11 DIAGNOSIS — I2510 Atherosclerotic heart disease of native coronary artery without angina pectoris: Secondary | ICD-10-CM | POA: Diagnosis not present

## 2023-06-11 DIAGNOSIS — E119 Type 2 diabetes mellitus without complications: Secondary | ICD-10-CM | POA: Diagnosis not present

## 2023-06-12 LAB — LIPID PANEL
Chol/HDL Ratio: 5.5 {ratio} — ABNORMAL HIGH (ref 0.0–4.4)
Cholesterol, Total: 138 mg/dL (ref 100–199)
HDL: 25 mg/dL — ABNORMAL LOW (ref >39)
LDL Chol Calc (NIH): 44 mg/dL (ref 0–99)
Triglycerides: 474 mg/dL — ABNORMAL HIGH (ref 0–149)
VLDL Cholesterol Cal: 69 mg/dL — ABNORMAL HIGH (ref 5–40)

## 2023-06-12 LAB — HEPATIC FUNCTION PANEL
ALT: 29 [IU]/L (ref 0–32)
AST: 44 [IU]/L — ABNORMAL HIGH (ref 0–40)
Albumin: 4.3 g/dL (ref 3.9–4.9)
Alkaline Phosphatase: 100 [IU]/L (ref 44–121)
Bilirubin Total: 0.3 mg/dL (ref 0.0–1.2)
Bilirubin, Direct: 0.14 mg/dL (ref 0.00–0.40)
Total Protein: 6.9 g/dL (ref 6.0–8.5)

## 2023-06-14 ENCOUNTER — Encounter (HOSPITAL_COMMUNITY): Payer: Medicare HMO

## 2023-06-16 ENCOUNTER — Encounter (HOSPITAL_COMMUNITY): Payer: Medicare HMO

## 2023-06-16 ENCOUNTER — Ambulatory Visit: Payer: Medicare HMO | Admitting: Pulmonary Disease

## 2023-06-17 ENCOUNTER — Other Ambulatory Visit (HOSPITAL_COMMUNITY): Payer: Self-pay

## 2023-06-18 ENCOUNTER — Encounter (HOSPITAL_COMMUNITY): Payer: Medicare HMO

## 2023-06-21 ENCOUNTER — Encounter (HOSPITAL_COMMUNITY): Payer: Medicare HMO

## 2023-06-22 ENCOUNTER — Telehealth: Payer: Self-pay | Admitting: Pharmacy Technician

## 2023-06-22 NOTE — Telephone Encounter (Signed)
Pharmacy Patient Advocate Encounter  Received notification from [facility] that Prior Authorization for pantoprazole has been APPROVED from 08/17/22 to 08/16/24   PA #/Case ID/Reference #: [id number]

## 2023-06-22 NOTE — Telephone Encounter (Signed)
Pharmacy Patient Advocate Encounter   Received notification from CoverMyMeds that prior authorization for pantoprazole is required/requested.   Insurance verification completed.   The patient is insured through [employer] .   Per test claim: PA required; PA submitted to above mentioned insurance via CoverMyMeds Key/confirmation #/EOC [id number] Status is pending

## 2023-06-23 ENCOUNTER — Telehealth (HOSPITAL_COMMUNITY): Payer: Self-pay | Admitting: *Deleted

## 2023-06-23 ENCOUNTER — Encounter (HOSPITAL_COMMUNITY): Payer: Medicare HMO

## 2023-06-23 ENCOUNTER — Encounter (HOSPITAL_COMMUNITY): Payer: Self-pay | Admitting: *Deleted

## 2023-06-23 DIAGNOSIS — J40 Bronchitis, not specified as acute or chronic: Secondary | ICD-10-CM | POA: Diagnosis not present

## 2023-06-23 DIAGNOSIS — J329 Chronic sinusitis, unspecified: Secondary | ICD-10-CM | POA: Diagnosis not present

## 2023-06-23 DIAGNOSIS — Z6838 Body mass index (BMI) 38.0-38.9, adult: Secondary | ICD-10-CM | POA: Diagnosis not present

## 2023-06-23 DIAGNOSIS — R030 Elevated blood-pressure reading, without diagnosis of hypertension: Secondary | ICD-10-CM | POA: Diagnosis not present

## 2023-06-23 DIAGNOSIS — Z20828 Contact with and (suspected) exposure to other viral communicable diseases: Secondary | ICD-10-CM | POA: Diagnosis not present

## 2023-06-23 DIAGNOSIS — Z955 Presence of coronary angioplasty implant and graft: Secondary | ICD-10-CM

## 2023-06-23 NOTE — Progress Notes (Signed)
Cardiac Individual Treatment Plan  Patient Details  Name: Rachel Odonnell MRN: [id number] Date of Birth: 1955-11-18 Referring Provider:   Flowsheet Row CARDIAC REHAB PHASE II ORIENTATION from 05/19/2023 in [place] [facility]  Referring Provider [place], Peter MD       Initial Encounter Date:  Flowsheet Row CARDIAC REHAB PHASE II ORIENTATION from 05/19/2023 in [place] CARDIAC REHABILITATION  Date 05/19/23       Visit Diagnosis: Status post primary angioplasty with coronary stent  Patient's Home Medications on Admission:  Current Outpatient Medications:    Accu-Chek Softclix Lancets lancets, Use as instructed to test blood glucose four times daily, Disp: 100 each, Rfl: 2   busPIRone (BUSPAR) 5 MG tablet, Take 5 mg by mouth at bedtime., Disp: , Rfl:    Cholecalciferol (VITAMIN D3) 125 MCG (5000 UT) CAPS, Take 1 capsule (5,000 Units total) by mouth daily., Disp: 90 capsule, Rfl: 0   citalopram (CELEXA) 20 MG tablet, Take 1 tablet (20 mg total) by mouth every evening., Disp: 30 tablet, Rfl: 4   clopidogrel (PLAVIX) 75 MG tablet, Take 1 tablet (75 mg total) by mouth daily., Disp: 90 tablet, Rfl: 3   Continuous Blood Gluc Sensor (FREESTYLE LIBRE 2 SENSOR) MISC, Change sensor every 14 days. Use to check glucose continuously, Disp: 2 each, Rfl: 0   Continuous Glucose Receiver (FREESTYLE LIBRE 2 READER) DEVI, USE TO CHECK GLUCOSE AS DIRECTED, Disp: 1 each, Rfl: 0   donepezil (ARICEPT) 5 MG tablet, Take 5 mg by mouth every evening., Disp: , Rfl:    empagliflozin (JARDIANCE) 10 MG TABS tablet, Take 1 tablet (10 mg total) by mouth daily before breakfast., Disp: 30 tablet, Rfl: 0   fenofibrate (TRICOR) 145 MG tablet, Take 145 mg by mouth every evening., Disp: , Rfl:    gabapentin (NEURONTIN) 100 MG capsule, Take 1 capsule (100 mg total) by mouth at bedtime., Disp: , Rfl:    glucose blood test strip, 1 each by Other route as needed. Use as instructed to test blood glucose four times  daily, Disp: 100 each, Rfl: 2   indapamide (LOZOL) 2.5 MG tablet, Take 1 tablet (2.5 mg total) by mouth daily., Disp: 30 tablet, Rfl: 11   insulin aspart (NOVOLOG FLEXPEN) 100 UNIT/ML FlexPen, Inject 14-20 Units into the skin 3 (three) times daily before meals., Disp: 15 mL, Rfl: 0   insulin degludec (TRESIBA FLEXTOUCH) 200 UNIT/ML FlexTouch Pen, Inject 100 Units into the skin at bedtime., Disp: 45 mL, Rfl: 0   Insulin Pen Needle (B-D ULTRAFINE III SHORT PEN) 31G X 8 MM MISC, 1 each by Does not apply route as directed., Disp: 100 each, Rfl: 2   isosorbide mononitrate (IMDUR) 60 MG 24 hr tablet, Take 1.5 tablets (90 mg total) by mouth daily. Can take an additional half tab as needed for chest pain, Disp: 135 tablet, Rfl: 1   levocetirizine (XYZAL) 5 MG tablet, Take 5 mg by mouth every evening., Disp: , Rfl:    levothyroxine (SYNTHROID) 50 MCG tablet, TAKE 1 TABLET BY MOUTH ONCE DAILY BEFORE BREAKFAST, Disp: 90 tablet, Rfl: 0   metFORMIN (GLUCOPHAGE) 500 MG tablet, TAKE 1 TABLET TWICE DAILY WITH MEALS, Disp: 180 tablet, Rfl: 1   mirabegron ER (MYRBETRIQ) 25 MG TB24 tablet, Take 1 tablet (25 mg total) by mouth daily., Disp: 30 tablet, Rfl: 11   modafinil (PROVIGIL) 100 MG tablet, Take 1 tablet (100 mg total) by mouth daily., Disp: 30 tablet, Rfl: 0   montelukast (SINGULAIR) 10 MG  tablet, Take 10 mg by mouth at bedtime., Disp: , Rfl:    Multiple Vitamin (MULTIVITAMIN ADULT PO), Take 1 tablet by mouth daily., Disp: , Rfl:    nitroGLYCERIN (NITROSTAT) 0.4 MG SL tablet, Place 1 tablet (0.4 mg total) under the tongue every 5 (five) minutes as needed for chest pain., Disp: 25 tablet, Rfl: 3   OVER THE COUNTER MEDICATION, Take 2 capsules by mouth daily. Omega XL, Disp: , Rfl:    pantoprazole (PROTONIX) 40 MG tablet, Take 2 tablets (80 mg total) by mouth daily., Disp: 60 tablet, Rfl: 4   rosuvastatin (CRESTOR) 40 MG tablet, Take 1 tablet (40 mg total) by mouth daily., Disp: 30 tablet, Rfl: 5   warfarin  (COUMADIN) 5 MG tablet, TAKE 1/2 TO 1 TABLET BY MOUTH DAILY AS DIRECTED BY THE COUMADIN CLINIC., Disp: 65 tablet, Rfl: 1  Past Medical History: Past Medical History:  Diagnosis Date   ADD (attention deficit disorder)    Diabetes mellitus without complication (HCC)    Hypertension     Tobacco Use: Social History   Tobacco Use  Smoking Status Former   Current packs/day: 0.00   Types: Cigarettes   Quit date: 08/17/1994   Years since quitting: 28.8  Smokeless Tobacco Never    Labs: Review Flowsheet  More data exists      Latest Ref Rng & Units 12/01/2022 02/19/2023 03/04/2023 04/16/2023 06/11/2023  Labs for ITP Cardiac and Pulmonary Rehab  Cholestrol 100 - 199 mg/dL - 409  - - 811   LDL (calc) 0 - 99 mg/dL - 91  - - 44   HDL-C >91 mg/dL - 43  - - 25   Trlycerides 0 - 149 mg/dL - 478  - - 295   Hemoglobin A1c 4.8 - 5.6 % 10.4  - 10.2  11.4  -  PH, Arterial 7.35 - 7.45 - - - 7.375  -  PCO2 arterial 32 - 48 mmHg - - - 41.9  -  Bicarbonate 20.0 - 28.0 mmol/L - - - 27.1  27.2  24.6  -  TCO2 22 - 32 mmol/L - - - 29  29  26   -  Acid-base deficit 0.0 - 2.0 mmol/L - - - 1.0  -  O2 Saturation % - - - 62  63  97  -    Details       Multiple values from one day are sorted in reverse-chronological order         Capillary Blood Glucose: Lab Results  Component Value Date   GLUCAP 255 (H) 06/04/2023   GLUCAP 230 (H) 05/31/2023   GLUCAP 286 (H) 05/31/2023   GLUCAP 304 (H) 04/17/2023   GLUCAP 292 (H) 04/17/2023     Exercise Target Goals: Exercise Program Goal: Individual exercise prescription set using results from initial 6 min walk test and THRR while considering  patient's activity barriers and safety.   Exercise Prescription Goal: Starting with aerobic activity 30 plus minutes a day, 3 days per week for initial exercise prescription. Provide home exercise prescription and guidelines that participant acknowledges understanding prior to discharge.  Activity Barriers & Risk  Stratification:  Activity Barriers & Cardiac Risk Stratification - 05/12/23 1359       Activity Barriers & Cardiac Risk Stratification   Activity Barriers Shortness of Breath;Arthritis;Back Problems;Chest Pain/Angina;Joint Problems;Balance Concerns;History of Falls;Deconditioning;Muscular Weakness    Cardiac Risk Stratification Moderate             6 Minute Walk:  6 Minute  Walk     Row Name 05/19/23 1600         6 Minute Walk   Phase Initial     Distance 950 feet     Walk Time 6 minutes     # of Rest Breaks 0     MPH 1.79     METS 1.73     RPE 13     Perceived Dyspnea  1     VO2 Peak 6.08     Symptoms Yes (comment)     Comments BL hip pain 3/10     Resting HR 60 bpm     Resting BP 138/52     Resting Oxygen Saturation  95 %     Exercise Oxygen Saturation  during 6 min walk 92 %     Max Ex. HR 80 bpm     Max Ex. BP 156/50     2 Minute Post BP 136/52              Oxygen Initial Assessment:   Oxygen Re-Evaluation:   Oxygen Discharge (Final Oxygen Re-Evaluation):   Initial Exercise Prescription:  Initial Exercise Prescription - 05/19/23 1600       Date of Initial Exercise RX and Referring Provider   Date 05/19/23    Referring Provider [place], Peter MD      Oxygen   Maintain Oxygen Saturation 88% or higher      Treadmill   MPH 1.3    Grade 0    Minutes 15    METs 1.7      NuStep   Level 1    SPM 80    Minutes 15    METs 1.5      REL-XR   Level 1    Speed 50    Minutes 15    METs 1.5      Prescription Details   Frequency (times per week) 3    Duration Progress to 30 minutes of continuous aerobic without signs/symptoms of physical distress      Intensity   THRR 40-80% of Max Heartrate 97-134    Ratings of Perceived Exertion 11-13    Perceived Dyspnea 0-4      Resistance Training   Training Prescription Yes    Weight 2 lbs    Reps 10-15             Perform Capillary Blood Glucose checks as needed.  Exercise Prescription  Changes:   Exercise Prescription Changes     Row Name 05/19/23 1600 05/31/23 1500           Response to Exercise   Blood Pressure (Admit) 138/52 136/62      Blood Pressure (Exercise) 156/50 138/74      Blood Pressure (Exit) 136/52 106/64      Heart Rate (Admit) 60 bpm 79 bpm      Heart Rate (Exercise) 80 bpm 104 bpm      Heart Rate (Exit) 70 bpm 68 bpm      Oxygen Saturation (Admit) 95 % --      Oxygen Saturation (Exercise) 92 % --      Oxygen Saturation (Exit) 97 % --      Rating of Perceived Exertion (Exercise) 13 13      Perceived Dyspnea (Exercise) 1 --      Duration Continue with 30 min of aerobic exercise without signs/symptoms of physical distress. Continue with 30 min of aerobic exercise without signs/symptoms of physical distress.  Intensity THRR unchanged THRR unchanged        Progression   Progression Continue to progress workloads to maintain intensity without signs/symptoms of physical distress. Continue to progress workloads to maintain intensity without signs/symptoms of physical distress.        Resistance Training   Training Prescription -- Yes      Weight -- 2 lbs      Reps -- 10-15        NuStep   Level -- 2      SPM -- 99      Minutes -- 30      METs -- 1.9        Oxygen   Maintain Oxygen Saturation -- 88% or higher               Exercise Comments:   Exercise Goals and Review:   Exercise Goals     Row Name 05/19/23 1606             Exercise Goals   Increase Physical Activity Yes       Intervention Provide advice, education, support and counseling about physical activity/exercise needs.;Develop an individualized exercise prescription for aerobic and resistive training based on initial evaluation findings, risk stratification, comorbidities and participant's personal goals.       Expected Outcomes Short Term: Attend rehab on a regular basis to increase amount of physical activity.;Long Term: Add in home exercise to make exercise  part of routine and to increase amount of physical activity.;Long Term: Exercising regularly at least 3-5 days a week.       Increase Strength and Stamina Yes       Intervention Provide advice, education, support and counseling about physical activity/exercise needs.;Develop an individualized exercise prescription for aerobic and resistive training based on initial evaluation findings, risk stratification, comorbidities and participant's personal goals.       Expected Outcomes Short Term: Increase workloads from initial exercise prescription for resistance, speed, and METs.;Long Term: Improve cardiorespiratory fitness, muscular endurance and strength as measured by increased METs and functional capacity ( );Short Term: Perform resistance training exercises routinely during rehab and add in resistance training at home       Able to understand and use rate of perceived exertion (RPE) scale Yes       Intervention Provide education and explanation on how to use RPE scale       Expected Outcomes Short Term: Able to use RPE daily in rehab to express subjective intensity level;Long Term:  Able to use RPE to guide intensity level when exercising independently       Able to understand and use Dyspnea scale Yes       Intervention Provide education and explanation on how to use Dyspnea scale       Expected Outcomes Short Term: Able to use Dyspnea scale daily in rehab to express subjective sense of shortness of breath during exertion;Long Term: Able to use Dyspnea scale to guide intensity level when exercising independently       Knowledge and understanding of Target Heart Rate Range (THRR) Yes       Intervention Provide education and explanation of THRR including how the numbers were predicted and where they are located for reference       Expected Outcomes Short Term: Able to state/look up THRR;Long Term: Able to use THRR to govern intensity when exercising independently;Short Term: Able to use daily as  guideline for intensity in rehab       Able to  check pulse independently Yes       Intervention Review the importance of being able to check your own pulse for safety during independent exercise;Provide education and demonstration on how to check pulse in carotid and radial arteries.       Expected Outcomes Short Term: Able to explain why pulse checking is important during independent exercise;Long Term: Able to check pulse independently and accurately       Understanding of Exercise Prescription Yes       Intervention Provide education, explanation, and written materials on patient's individual exercise prescription       Expected Outcomes Short Term: Able to explain program exercise prescription;Long Term: Able to explain home exercise prescription to exercise independently                Exercise Goals Re-Evaluation :    Discharge Exercise Prescription (Final Exercise Prescription Changes):  Exercise Prescription Changes - 05/31/23 1500       Response to Exercise   Blood Pressure (Admit) 136/62    Blood Pressure (Exercise) 138/74    Blood Pressure (Exit) 106/64    Heart Rate (Admit) 79 bpm    Heart Rate (Exercise) 104 bpm    Heart Rate (Exit) 68 bpm    Rating of Perceived Exertion (Exercise) 13    Duration Continue with 30 min of aerobic exercise without signs/symptoms of physical distress.    Intensity THRR unchanged      Progression   Progression Continue to progress workloads to maintain intensity without signs/symptoms of physical distress.      Resistance Training   Training Prescription Yes    Weight 2 lbs    Reps 10-15      NuStep   Level 2    SPM 99    Minutes 30    METs 1.9      Oxygen   Maintain Oxygen Saturation 88% or higher             Nutrition:  Target Goals: Understanding of nutrition guidelines, daily intake of sodium 1500mg , cholesterol 200mg , calories 30% from fat and 7% or less from saturated fats, daily to have 5 or more servings of  fruits and vegetables.  Biometrics:  Pre Biometrics - 05/19/23 1606       Pre Biometrics   Height 5\' 4"  (1.626 m)    Weight 228 lb 13.4 oz (103.8 kg)    Waist Circumference 46 inches    Hip Circumference 51 inches    Waist to Hip Ratio 0.9 %    BMI (Calculated) 39.26    Grip Strength 22.6 kg    Single Leg Stand 4.63 seconds              Nutrition Therapy Plan and Nutrition Goals:  Nutrition Therapy & Goals - 05/12/23 1410       Personal Nutrition Goals   Comments The pt eats 2 meals a day and sometimes eats snacks in between.  She tries to eat healthy but gets confused about what she can eat.  The pt eats chicken and fish, but she does eat red meat at times.  She tries to eat mainly grilled meat vs fried.      Intervention Plan   Intervention Nutrition handout(s) given to patient.    Expected Outcomes Short Term Goal: Understand basic principles of dietary content, such as calories, fat, sodium, cholesterol and nutrients.;Long Term Goal: Adherence to prescribed nutrition plan.  Nutrition Assessments:  MEDIFICTS Score Key: >=70 Need to make dietary changes  40-70 Heart Healthy Diet <= 40 Therapeutic Level Cholesterol Diet  Flowsheet Row CARDIAC REHAB PHASE II ORIENTATION from 05/19/2023 in [facility]  Picture Your Plate Total Score on Admission 44      Picture Your Plate Scores: <30 Unhealthy dietary pattern with much room for improvement. 41-50 Dietary pattern unlikely to meet recommendations for good health and room for improvement. 51-60 More healthful dietary pattern, with some room for improvement.  >60 Healthy dietary pattern, although there may be some specific behaviors that could be improved.    Nutrition Goals Re-Evaluation:   Nutrition Goals Discharge (Final Nutrition Goals Re-Evaluation):   Psychosocial: Target Goals: Acknowledge presence or absence of significant depression and/or stress, maximize coping  skills, provide positive support system. Participant is able to verbalize types and ability to use techniques and skills needed for reducing stress and depression.  Initial Review & Psychosocial Screening:  Initial Psych Review & Screening - 05/12/23 1417       Initial Review   Current issues with History of Depression;Current Psychotropic Meds;Current Sleep Concerns      Family Dynamics   Good Support System? Yes    Comments The pt's husband and her sisters are her main support system.      Barriers   Psychosocial barriers to participate in program There are no identifiable barriers or psychosocial needs.      Screening Interventions   Interventions Encouraged to exercise;Provide feedback about the scores to participant;To provide support and resources with identified psychosocial needs    Expected Outcomes Long Term Goal: Stressors or current issues are controlled or eliminated.;Short Term goal: Identification and review with participant of any Quality of Life or Depression concerns found by scoring the questionnaire.;Long Term goal: The participant improves quality of Life and PHQ9 Scores as seen by post scores and/or verbalization of changes             Quality of Life Scores:  Quality of Life - 05/19/23 1317       Quality of Life Scores   Health/Function Pre 14.43 %    Socioeconomic Pre 26.44 %    Psych/Spiritual Pre 28.29 %    Family Pre 30 %    GLOBAL Pre 22.17 %            Scores of 19 and below usually indicate a poorer quality of life in these areas.  A difference of  2-3 points is a clinically meaningful difference.  A difference of 2-3 points in the total score of the Quality of Life Index has been associated with significant improvement in overall quality of life, self-image, physical symptoms, and general health in studies assessing change in quality of life.  PHQ-9: Review Flowsheet  More data may exist      05/19/2023 10/10/2021 07/30/2021 04/18/2021  03/28/2021  Depression screen PHQ 2/9  Decreased Interest 0 0 0 1 1  Down, Depressed, Hopeless 0 1 1 1 1   PHQ - 2 Score 0 1 1 2 2   Altered sleeping 3 - - - 1  Tired, decreased energy 2 - - - 1  Change in appetite 0 - - - 1  Feeling bad or failure about yourself  0 - - - 0  Trouble concentrating 0 - - - 0  Moving slowly or fidgety/restless 0 - - - 0  Suicidal thoughts 0 - - - 0  PHQ-9 Score 5 - - - 5  Difficult doing work/chores Not difficult at all - - - -    Details           Interpretation of Total Score  Total Score Depression Severity:  1-4 = Minimal depression, 5-9 = Mild depression, 10-14 = Moderate depression, 15-19 = Moderately severe depression, 20-27 = Severe depression   Psychosocial Evaluation and Intervention:  Psychosocial Evaluation - 05/12/23 1420       Psychosocial Evaluation & Interventions   Interventions Encouraged to exercise with the program and follow exercise prescription;Stress management education;Relaxation education    Comments Pt was referred to cardiac rehab since she had primary angioplasty with a coronary stent.  She denies anxiety/depression or suicidal thoughts, and she states that she did have a history of depression but was referred to a psyschiatrist at [facility] in [place].  She states that talking to the psychiatrist made her feel like a "weight was lifted."  She takes Buspar and  Celexa, as well Modafinil to keep her awake during the day.  The pt states that if she does not take her Modafinil in the morning, then she will sleep for a large portion of the day.  The fatigue and sleepiness make it hard for her to get motivivated to exercise, and she has jaw pain if she pushes herself too hard with exercise (she stated MD is aware).  She has a good support system in her husband and sisters, and they encourage her to get active at home.  Her goals for the program are to increase her strength/endurance so that she will feel better, and she wants to lose  weight and be able to walk with less shortness of breath.    Continue Psychosocial Services  Follow up required by staff             Psychosocial Re-Evaluation:   Psychosocial Discharge (Final Psychosocial Re-Evaluation):   Vocational Rehabilitation: Provide vocational rehab assistance to qualifying candidates.   Vocational Rehab Evaluation & Intervention:  Vocational Rehab - 05/12/23 1416       Vocational Rehab Re-Evaulation   Comments Pt is retired and has no plans to return to work.             Education: Education Goals: Education classes will be provided on a weekly basis, covering required topics. Participant will state understanding/return demonstration of topics presented.  Learning Barriers/Preferences:  Learning Barriers/Preferences - 05/12/23 1414       Learning Barriers/Preferences   Learning Barriers Exercise Concerns   memory impairment per pt   Learning Preferences Verbal Instruction;Skilled Demonstration;Video             Education Topics: Hypertension, Hypertension Reduction -Define heart disease and high blood pressure. Discus how high blood pressure affects the body and ways to reduce high blood pressure.   Exercise and Your Heart -Discuss why it is important to exercise, the FITT principles of exercise, normal and abnormal responses to exercise, and how to exercise safely.   Angina -Discuss definition of angina, causes of angina, treatment of angina, and how to decrease risk of having angina.   Cardiac Medications -Review what the following cardiac medications are used for, how they affect the body, and side effects that may occur when taking the medications.  Medications include Aspirin, Beta blockers, calcium channel blockers, ACE Inhibitors, angiotensin receptor blockers, diuretics, digoxin, and antihyperlipidemics.   Congestive Heart Failure -Discuss the definition of CHF, how to live with CHF, the signs and symptoms of CHF,  and how keep track  of weight and sodium intake.   Heart Disease and Intimacy -Discus the effect sexual activity has on the heart, how changes occur during intimacy as we age, and safety during sexual activity.   Smoking Cessation / COPD -Discuss different methods to quit smoking, the health benefits of quitting smoking, and the definition of COPD.   Nutrition I: Fats -Discuss the types of cholesterol, what cholesterol does to the heart, and how cholesterol levels can be controlled. Flowsheet Row CARDIAC REHAB PHASE II EXERCISE from 06/09/2023 in [place] CARDIAC REHABILITATION  Date 06/02/23  Educator hb  Instruction Review Code 1- Verbalizes Understanding       Nutrition II: Labels -Discuss the different components of food labels and how to read food label   Heart Parts/Heart Disease and PAD -Discuss the anatomy of the heart, the pathway of blood circulation through the heart, and these are affected by heart disease.   Stress I: Signs and Symptoms -Discuss the causes of stress, how stress may lead to anxiety and depression, and ways to limit stress. Flowsheet Row CARDIAC REHAB PHASE II EXERCISE from 06/09/2023 in [place] CARDIAC REHABILITATION  Date 06/09/23  Educator HB  Instruction Review Code 1- Verbalizes Understanding       Stress II: Relaxation -Discuss different types of relaxation techniques to limit stress.   Warning Signs of Stroke / TIA -Discuss definition of a stroke, what the signs and symptoms are of a stroke, and how to identify when someone is having stroke.   Knowledge Questionnaire Score:  Knowledge Questionnaire Score - 05/12/23 1449       Knowledge Questionnaire Score   Pre Score 21             Core Components/Risk Factors/Patient Goals at Admission:  Personal Goals and Risk Factors at Admission - 05/12/23 1421       Core Components/Risk Factors/Patient Goals on Admission    Weight Management Yes;Weight Loss;Obesity     Intervention Weight Management/Obesity: Establish reasonable short term and long term weight goals.;Obesity: Provide education and appropriate resources to help participant work on and attain dietary goals.    Expected Outcomes Short Term: Continue to assess and modify interventions until short term weight is achieved;Long Term: Adherence to nutrition and physical activity/exercise program aimed toward attainment of established weight goal;Weight Loss: Understanding of general recommendations for a balanced deficit meal plan, which promotes 1-2 lb weight loss per week and includes a negative energy balance of [phone] kcal/d;Understanding recommendations for meals to include 15-35% energy as protein, 25-35% energy from fat, 35-60% energy from carbohydrates, less than 200mg  of dietary cholesterol, 20-35 gm of total fiber daily    Improve shortness of breath with ADL's Yes    Intervention Provide education, individualized exercise plan and daily activity instruction to help decrease symptoms of SOB with activities of daily living.    Expected Outcomes Short Term: Improve cardiorespiratory fitness to achieve a reduction of symptoms when performing ADLs;Long Term: Be able to perform more ADLs without symptoms or delay the onset of symptoms    Diabetes Yes    Intervention Provide education about signs/symptoms and action to take for hypo/hyperglycemia.;Provide education about proper nutrition, including hydration, and aerobic/resistive exercise prescription along with prescribed medications to achieve blood glucose in normal ranges: Fasting glucose 65-99 mg/dL    Expected Outcomes Short Term: Participant verbalizes understanding of the signs/symptoms and immediate care of hyper/hypoglycemia, proper foot care and importance of medication, aerobic/resistive exercise and nutrition plan for blood glucose control.;Long Term: Attainment  of HbA1C < 7%.    Hypertension Yes    Intervention Provide education on lifestyle  modifcations including regular physical activity/exercise, weight management, moderate sodium restriction and increased consumption of fresh fruit, vegetables, and low fat dairy, alcohol moderation, and smoking cessation.;Monitor prescription use compliance.    Expected Outcomes Short Term: Continued assessment and intervention until BP is < 140/33mm HG in hypertensive participants. < 130/80mm HG in hypertensive participants with diabetes, heart failure or chronic kidney disease.;Long Term: Maintenance of blood pressure at goal levels.    Lipids Yes    Intervention Provide education and support for participant on nutrition & aerobic/resistive exercise along with prescribed medications to achieve LDL 70mg , HDL >40mg .    Expected Outcomes Short Term: Participant states understanding of desired cholesterol values and is compliant with medications prescribed. Participant is following exercise prescription and nutrition guidelines.;Long Term: Cholesterol controlled with medications as prescribed, with individualized exercise RX and with personalized nutrition plan. Value goals: LDL < 70mg , HDL > 40 mg.    Personal Goal Other Yes    Personal Goal The pt wants to get "back to normal."  The pt wants to lose weight and be able to walk without getting so short of breath.  She wants to increase her strength/endurance to feel better.    Intervention Pt will attend CR 3 times a week and start a home exercise routine.    Expected Outcomes Pt will meet both her personal and program goals.             Core Components/Risk Factors/Patient Goals Review:    Core Components/Risk Factors/Patient Goals at Discharge (Final Review):    ITP Comments:  ITP Comments     Row Name 05/19/23 1612 05/26/23 1204 05/31/23 1448 06/23/23 0800     ITP Comments Patient arrived for 1st visit/orientation/education at 1400. Patient was referred to CR by Dr. Peter [place] due to S/P primary angioplasty with stent placement. During  orientation advised patient on arrival and appointment times what to wear, what to do before, during and after exercise. Reviewed attendance and class policy.  Pt is scheduled to return Cardiac Rehab on 05/24/23 at 1430. Pt was advised to come to class 15 minutes before class starts.  Discussed RPE/Dpysnea scales. Patient participated in warm up stretches. Patient was able to complete 6 minute walk test.  Telemetry:NSR. Patient was measured for the equipment. Discussed equipment safety with patient. Took patient pre-anthropometric measurements. Patient finished visit at 1315. 30 day review completed. ITP sent to Dr. Dina Rich, Medical Director of Cardiac Rehab. Continue with ITP unless changes are made by physician.  Pt has only completed orientation thus far. First full day of exercise!  Patient was oriented to gym and equipment including functions, settings, policies, and procedures.  Patient's individual exercise prescription and treatment plan were reviewed.  All starting workloads were established based on the results of the 6 minute walk test done at initial orientation visit.  The plan for exercise progression was also introduced and progression will be customized based on patient's performance and goals. 30 day review completed. ITP sent to Dr. Dina Rich, Medical Director of Cardiac Rehab. Continue with ITP unless changes are made by physician.  Still newer to program and was out on vacation last week and has yet to return.  Have not been able to assess for goals yet.             Comments: 30 day review

## 2023-06-23 NOTE — Telephone Encounter (Signed)
Rachel Odonnell called to let us know that she still was not feeling well.  She tested for COVID and her test was positive.  She was encouraged to contact her doctor for help with symptom management.  She was also told to stay out for 10 days from positive test day (today 11/6).  She may return to rehab on 07/05/23 if feeling better and symptom free. She was also advised to stay home for 5 days.

## 2023-06-24 ENCOUNTER — Ambulatory Visit: Payer: Medicare HMO

## 2023-06-25 ENCOUNTER — Encounter (HOSPITAL_COMMUNITY): Payer: Medicare HMO

## 2023-06-28 ENCOUNTER — Ambulatory Visit: Payer: Medicare HMO | Admitting: Cardiology

## 2023-06-28 ENCOUNTER — Encounter (HOSPITAL_COMMUNITY): Payer: Medicare HMO

## 2023-06-28 ENCOUNTER — Encounter (HOSPITAL_COMMUNITY): Payer: Medicare HMO | Attending: Cardiology | Admitting: *Deleted

## 2023-06-28 DIAGNOSIS — I4821 Permanent atrial fibrillation: Secondary | ICD-10-CM | POA: Insufficient documentation

## 2023-06-28 DIAGNOSIS — E781 Pure hyperglyceridemia: Secondary | ICD-10-CM | POA: Insufficient documentation

## 2023-06-28 DIAGNOSIS — Z5181 Encounter for therapeutic drug level monitoring: Secondary | ICD-10-CM | POA: Diagnosis not present

## 2023-06-28 DIAGNOSIS — I350 Nonrheumatic aortic (valve) stenosis: Secondary | ICD-10-CM | POA: Insufficient documentation

## 2023-06-28 DIAGNOSIS — I25119 Atherosclerotic heart disease of native coronary artery with unspecified angina pectoris: Secondary | ICD-10-CM | POA: Insufficient documentation

## 2023-06-28 DIAGNOSIS — I10 Essential (primary) hypertension: Secondary | ICD-10-CM | POA: Insufficient documentation

## 2023-06-28 DIAGNOSIS — E785 Hyperlipidemia, unspecified: Secondary | ICD-10-CM | POA: Insufficient documentation

## 2023-06-28 DIAGNOSIS — Z79899 Other long term (current) drug therapy: Secondary | ICD-10-CM | POA: Insufficient documentation

## 2023-06-28 DIAGNOSIS — I4891 Unspecified atrial fibrillation: Secondary | ICD-10-CM | POA: Insufficient documentation

## 2023-06-28 DIAGNOSIS — R5383 Other fatigue: Secondary | ICD-10-CM | POA: Insufficient documentation

## 2023-06-28 LAB — POCT INR: INR: 2.7 (ref 2.0–3.0)

## 2023-06-28 NOTE — Patient Instructions (Signed)
Continue warfarin 1/2 tablet daily except 1 tablet on Sundays, Tuesdays and Thursdays. Recheck INR in 4 weeks.  

## 2023-06-30 ENCOUNTER — Encounter (HOSPITAL_COMMUNITY): Payer: Medicare HMO

## 2023-07-02 ENCOUNTER — Encounter (HOSPITAL_COMMUNITY): Payer: Medicare HMO

## 2023-07-05 ENCOUNTER — Encounter (HOSPITAL_COMMUNITY): Payer: Medicare HMO

## 2023-07-06 ENCOUNTER — Other Ambulatory Visit: Payer: Self-pay | Admitting: "Endocrinology

## 2023-07-06 ENCOUNTER — Other Ambulatory Visit: Payer: Self-pay | Admitting: Pulmonary Disease

## 2023-07-06 ENCOUNTER — Encounter: Payer: Self-pay | Admitting: Nurse Practitioner

## 2023-07-06 ENCOUNTER — Encounter (INDEPENDENT_AMBULATORY_CARE_PROVIDER_SITE_OTHER): Payer: Medicare HMO | Admitting: Nurse Practitioner

## 2023-07-06 VITALS — BP 118/60 | HR 60 | Ht 64.0 in | Wt 226.0 lb

## 2023-07-06 DIAGNOSIS — R5383 Other fatigue: Secondary | ICD-10-CM | POA: Diagnosis not present

## 2023-07-06 DIAGNOSIS — E785 Hyperlipidemia, unspecified: Secondary | ICD-10-CM

## 2023-07-06 DIAGNOSIS — Z79899 Other long term (current) drug therapy: Secondary | ICD-10-CM | POA: Diagnosis not present

## 2023-07-06 DIAGNOSIS — I350 Nonrheumatic aortic (valve) stenosis: Secondary | ICD-10-CM | POA: Diagnosis not present

## 2023-07-06 DIAGNOSIS — E039 Hypothyroidism, unspecified: Secondary | ICD-10-CM

## 2023-07-06 DIAGNOSIS — E1159 Type 2 diabetes mellitus with other circulatory complications: Secondary | ICD-10-CM

## 2023-07-06 DIAGNOSIS — I10 Essential (primary) hypertension: Secondary | ICD-10-CM

## 2023-07-06 DIAGNOSIS — I4891 Unspecified atrial fibrillation: Secondary | ICD-10-CM | POA: Diagnosis not present

## 2023-07-06 DIAGNOSIS — I25119 Atherosclerotic heart disease of native coronary artery with unspecified angina pectoris: Secondary | ICD-10-CM | POA: Diagnosis not present

## 2023-07-06 DIAGNOSIS — E781 Pure hyperglyceridemia: Secondary | ICD-10-CM

## 2023-07-06 MED ORDER — FENOFIBRATE 160 MG PO TABS: 160.0000 mg | ORAL_TABLET | Freq: Every day | ORAL | 1 refills | Status: DC

## 2023-07-06 NOTE — Patient Instructions (Addendum)
Medication Instructions:  Your physician has recommended you make the following change in your medication:  Please Increase fenofibrate to 160 MG tablet   Labwork: As soon as you can   Testing/Procedures: None   Follow-Up: Your physician recommends that you schedule a follow-up appointment in: 2-3 months   Any Other Special Instructions Will Be Listed Below (If Applicable).  If you need a refill on your cardiac medications before your next appointment, please call your pharmacy.

## 2023-07-06 NOTE — Progress Notes (Unsigned)
Cardiology Office Note:  .   Date:  07/06/2023 ID:  Rachel Odonnell, DOB May 09, 1956, MRN [id number] PCP: Sheela Stack   [facility] Providers Cardiologist:  Nona Dell, MD Cardiology APP:  Sharlene Dory, NP    History of Present Illness: Rachel Odonnell is a 67 y.o. female with a PMH of CAD, HLD, HTN, PAF, mild aortic valve stenosis, and OSA (compliant with CPAP), who presents today for 1 month follow-up.   Prior history of DES x 1 to prox to mid LAD, abrupt occlusion of diagonal that was jailed by stent. Successfully treated with POBA in August 2024.   Seen by Robin Searing, NP on 04/23/2023 after cardiac catheterization. Was overall feeling better but felt few episodes of fleeting chest pain. Was staying active. Was cleared to start cardiac rehab.   05/25/2023 - Today she presents for 1 month follow-up. She says her chest pain seems to be improved slightly, but not significantly. Describes chest pain as dull ache along left lateral side of chest wall, nonradiating. Denies any alleviating or aggravating factors, however says she takes NTG when this occurs, lays down, and pain is resolved. 5/10 on 0-10 pain scale. Says she is tired, not very active. Says she is not taking provigil. Says if she takes this medicine, she will not be able to get sleep and will up awake all night. Denies any shortness of breath, palpitations, syncope, presyncope, dizziness, orthopnea, PND, swelling or significant weight changes, acute bleeding, or claudication.  07/06/2023 - Presents for follow-up today with her husband.  She says that her and her husband have returned from a cruise not that long ago.  Has been recently getting over some sinusitis and bronchitis. She continues to notice CP, not as often compared to last office visit, says adjustment with Imdur has helped her symptoms. Continues to note fatigue. Denies any shortness of breath, palpitations, syncope, presyncope, dizziness,  orthopnea, PND, swelling or significant weight changes, acute bleeding, or claudication.  She is requesting to stop cardiac rehab at this point and just exercise at [employer].  She also sees a nutritionist regularly.  ROS: Negative. See HPI.   Studies Reviewed: .    Right/left heart cath 03/2023:    Prox RCA to Mid RCA lesion is 35% stenosed.   1st Mrg lesion is 60% stenosed.   Prox LAD to Mid LAD lesion is 60% stenosed.   A drug-eluting stent was successfully placed using a SYNERGY XD 3.0X38.   Post intervention, there is a 0% residual stenosis.   1st Diag lesion is 40% stenosed.   Balloon angioplasty was performed using a BALLN SAPPHIRE 2.0X12.   Post intervention, there is a 0% residual stenosis.   The left ventricular systolic function is normal.   LV end diastolic pressure is mildly elevated.   The left ventricular ejection fraction is 55-65% by visual estimate.   Hemodynamic findings consistent with mild pulmonary hypertension.   Recommend to resume Warfarin, at currently prescribed dose and frequency on 04/17/2023.   Recommend concurrent antiplatelet therapy of Aspirin 81 mg for 1 month and Clopidogrel 75mg  daily for 6 months .   Single vessel obstructive CAD. Long segment of disease in the proximal to mid LAD. Angiographically this did not appear to be severe but flow was impaired on both CT FFR and directly measured RFR. Severe plaque on IVUS with Minimal lumen diameter of 2.5 mm squared.  Normal LV function. Mildly elevated LV filling pressures. PCWP 21/23  mean 16 mm Hg. LVEDP 21 mm Hg Mild pulmonary HTN PAP 40/14 mean 27 mm Hg Cardiac output 4.3 L/min with index 2.08. Successful PCI of the proximal to mid LAD with IVUS guidance and DES x 1. Abrupt occlusion of diagonal that was jailed by stent. Successfully treated with POBA through the stent struts.    Plan: will monitor overnight on telemetry. DAPT with ASA for one month and Plavix for 6 months. May resume Coumadin  tomorrow if no bleeding problems.   Echo 02/2023:   1. Left ventricular ejection fraction, by estimation, is 60 to 65%. Left  ventricular ejection fraction by 3D volume is 63 %. The left ventricle has  normal function. The left ventricle has no regional wall motion  abnormalities. Left ventricular diastolic   parameters are consistent with Grade I diastolic dysfunction (impaired  relaxation). The average left ventricular global longitudinal strain is  -20.2 %. The global longitudinal strain is normal.   2. Right ventricular systolic function is normal. The right ventricular  size is normal.   3. The mitral valve is grossly normal. Trivial mitral valve  regurgitation. No evidence of mitral stenosis.   4. The aortic valve is calcified. There is mild calcification of the  aortic valve. There is mild thickening of the aortic valve. Aortic valve  regurgitation is not visualized. Mild aortic valve stenosis. Aortic valve  area, by VTI measures 1.55 cm.  Aortic valve mean gradient measures 12.0 mmHg. Aortic valve Vmax measures  2.32 m/s.  CCTA 02/2023: IMPRESSION: 1. Coronary calcium score of 153. This was 83rd percentile for age-, sex, and race-matched controls.   2. Total plaque volume 313 mm3 which is 64th percentile for age- and sex-matched controls (calcified plaque 59 mm3; non-calcified plaque 254 mm3). TPV is severe.   3. Normal coronary origin with right dominance.   4. There is moderate (50-69%) calcified plaque in the LAD and LCX. CAD-RADS 3.   5. Will send study for FFRct.  5. Aortic atherosclerosis.   IMPRESSION: 1. FFRct findings are consistent with significant stenosis in the mid LAD.   2.  Recommend cardiac catheterization.  Cardiac monitor 04/2021:  12 day monitor Rare supraventricular ectopy in the form of isolated PACs, couplets, triplets. Fourteen episodes of SVT longest 8 beats Rare ventricular ectopy in the form of isolated PVCs, couplets. Four episodes of  NSVT longest 15 beats. Reported symptoms correlated with sinus rhythm, PACs, PVCs, and afib Episodes of afib <1% burden, rates were controlled Single nocturnal pause 3.1 seconds     Patch Wear Time:  12 days and 7 hours (2022-08-22T11:20:36-0400 to 2022-09-03T18:39:38-0400)   Patient had a min HR of 45 bpm, max HR of 128 bpm, and avg HR of 58 bpm. Predominant underlying rhythm was Sinus Rhythm. First Degree AV Block was present. 4 Ventricular Tachycardia runs occurred, the run with the fastest interval lasting 15 beats with a  max rate of 128 bpm (avg 119 bpm); the run with the fastest interval was also the longest. 14 Supraventricular Tachycardia runs occurred, the run with the fastest interval lasting 8 beats with a max rate of 121 bpm (avg 99 bpm); the run with the fastest  interval was also the longest. Atrial Fibrillation occurred (<1% burden), ranging from 64-116 bpm (avg of 82 bpm), the longest lasting 4 mins 51 secs with an avg rate of 79 bpm. 1 Pause occurred lasting 3.1 secs (19 bpm). Atrial Fibrillation was  detected within +/- 45 seconds of symptomatic patient event(s). Isolated  SVEs were rare (<1.0%), SVE Couplets were rare (<1.0%), and SVE Triplets were rare (<1.0%). Isolated VEs were rare (<1.0%), VE Couplets were rare (<1.0%), and no VE Triplets were  present. Risk Assessment/Calculations:    CHA2DS2-VASc Score = 4  This indicates a 4.8% annual risk of stroke. The patient's score is based upon: CHF History: 0 HTN History: 1 Diabetes History: 0 Stroke History: 0 Vascular Disease History: 1 Age Score: 1 Gender Score: 1   The 10-year ASCVD risk score (Arnett DK, et al., 2019) is: 28.3%   Values used to calculate the score:     Age: 62 years     Sex: Female     Is Non-Hispanic African American: No     Diabetic: Yes     Tobacco smoker: Yes     Systolic Blood Pressure: 118 mmHg     Is BP treated: Yes     HDL Cholesterol: 25 mg/dL     Total Cholesterol: 138 mg/dL       Physical Exam:   VS:  BP 118/60   Pulse 60   Ht 5\' 4"  (1.626 m)   Wt 226 lb (102.5 kg)   SpO2 93%   BMI 38.79 kg/m    Wt Readings from Last 3 Encounters:  07/06/23 226 lb (102.5 kg)  06/08/23 230 lb 12.8 oz (104.7 kg)  05/25/23 225 lb (102.1 kg)    GEN: Obese, 67 year old female in no acute distress NECK: No JVD; No carotid bruits CARDIAC: S1/S2, irregularly irregular rhythm, Grade 1 murmur, no rubs or gallops RESPIRATORY:  Clear to auscultation without rales, wheezing or rhonchi  EXTREMITIES:  No edema; No deformity   ASSESSMENT AND PLAN: .    CAD Improved chest pain from last OV.. Cardiac catheterization report noted above. Completed triple therapy per cardiac cath recommendations. Continue Plavix, rosuvastatin, Imdur, and NTG PRN.  Hesitant to uptitrate Imdur more at this point due to her soft BP.  Will increase fenofibrate to 160 mg daily and obtain labs as mentioned below.  Heart healthy diet and regular cardiovascular exercise encouraged. Care and ED precautions discussed.   HLD, hypertriglyceridemia LDL 44 05/2023.  Triglyceride levels were noted to be 474 at the time.  Will increase fenofibrate to 160 mg daily and at next office visit plan to obtain LFT/FLP. Continue rest of medication regimen. Heart healthy diet and regular cardiovascular exercise encouraged.  Follow-up with nutritionist as scheduled.  HTN BP stable. Discussed to monitor BP at home at least 2 hours after medications and sitting for 5-10 minutes.  No medication changes at this time. Heart healthy diet and regular cardiovascular exercise encouraged.   A-fib Denies any tachycardia or palpitations. HR well controlled today. [facility]. Continue Coumadin, denies any bleeding issues.   Mild aortic valve stenosis Mild aortic valve stenosis noted on most recent Eco with mean gradient measuring 12 mmHg. Denies any red flag signs/symptoms. Will continue to monitor and recommend updating  Echo in 1 year, July 2025.  Care and ED precautions discussed.  Fatigue, medication management Etiology multifactorial.  Believe deconditioning is also playing a part of this.  Compliant with her CPAP. Recommended to slowly increase her physical activity.  Follow-up with nutritionist as scheduled.  Recommended to discuss this further with PCP. She verbalized understanding.  Will obtain the following labs: Thyroid panel, B12,  CMET, and Magnesium. Continue to follow with PCP.  7. Obesity Weight loss via diet and exercise encouraged. Discussed the impact being overweight would have  on cardiovascular risk.  Dispo: Follow-up with me/APP in 2-3 months or sooner if anything changes.   Signed, Sharlene Dory, NP

## 2023-07-07 ENCOUNTER — Encounter (HOSPITAL_COMMUNITY): Payer: Medicare HMO

## 2023-07-07 DIAGNOSIS — Z79899 Other long term (current) drug therapy: Secondary | ICD-10-CM | POA: Diagnosis not present

## 2023-07-07 DIAGNOSIS — I2510 Atherosclerotic heart disease of native coronary artery without angina pectoris: Secondary | ICD-10-CM | POA: Diagnosis not present

## 2023-07-07 DIAGNOSIS — R5383 Other fatigue: Secondary | ICD-10-CM | POA: Diagnosis not present

## 2023-07-07 DIAGNOSIS — I272 Pulmonary hypertension, unspecified: Secondary | ICD-10-CM | POA: Diagnosis not present

## 2023-07-07 DIAGNOSIS — E039 Hypothyroidism, unspecified: Secondary | ICD-10-CM | POA: Diagnosis not present

## 2023-07-07 DIAGNOSIS — I4891 Unspecified atrial fibrillation: Secondary | ICD-10-CM | POA: Diagnosis not present

## 2023-07-08 LAB — VITAMIN B12: Vitamin B-12: 600 pg/mL (ref 232–1245)

## 2023-07-08 LAB — COMPREHENSIVE METABOLIC PANEL
ALT: 21 [IU]/L (ref 0–32)
AST: 34 [IU]/L (ref 0–40)
Albumin: 4.2 g/dL (ref 3.9–4.9)
Alkaline Phosphatase: 67 [IU]/L (ref 44–121)
BUN/Creatinine Ratio: 17 (ref 12–28)
BUN: 17 mg/dL (ref 8–27)
Bilirubin Total: 0.4 mg/dL (ref 0.0–1.2)
CO2: 24 mmol/L (ref 20–29)
Calcium: 10.3 mg/dL (ref 8.7–10.3)
Chloride: 97 mmol/L (ref 96–106)
Creatinine, Ser: 1.02 mg/dL — ABNORMAL HIGH (ref 0.57–1.00)
Globulin, Total: 3 g/dL (ref 1.5–4.5)
Glucose: 173 mg/dL — ABNORMAL HIGH (ref 70–99)
Potassium: 3.8 mmol/L (ref 3.5–5.2)
Sodium: 138 mmol/L (ref 134–144)
Total Protein: 7.2 g/dL (ref 6.0–8.5)
eGFR: 60 mL/min/{1.73_m2} (ref >59)

## 2023-07-08 LAB — THYROID PANEL WITH TSH
Free Thyroxine Index: 2.9 (ref 1.2–4.9)
T3 Uptake Ratio: 28 % (ref 24–39)
T4, Total: 10.4 ug/dL (ref 4.5–12.0)
TSH: 2.27 u[IU]/mL (ref 0.450–4.500)

## 2023-07-08 LAB — MAGNESIUM: Magnesium: 1.5 mg/dL — ABNORMAL LOW (ref 1.6–2.3)

## 2023-07-09 ENCOUNTER — Telehealth (HOSPITAL_COMMUNITY): Payer: Self-pay

## 2023-07-09 ENCOUNTER — Encounter (HOSPITAL_COMMUNITY): Payer: Medicare HMO

## 2023-07-09 NOTE — Telephone Encounter (Signed)
Patient requesting refill on Modafinil 100mg    LOV 04/21/23 with Dr.Alva   Dr.Alva Zenaida Niece you please advise   Thank you

## 2023-07-09 NOTE — Telephone Encounter (Signed)
Calling to follow up after COVID and seeing if she is coming back to Cardiac Rehab. Pt did not answer, left a message to call back with call back number.

## 2023-07-12 ENCOUNTER — Encounter (HOSPITAL_COMMUNITY): Payer: Self-pay | Admitting: *Deleted

## 2023-07-12 ENCOUNTER — Ambulatory Visit: Payer: Medicare HMO | Admitting: "Endocrinology

## 2023-07-12 ENCOUNTER — Encounter (HOSPITAL_COMMUNITY): Payer: Medicare HMO

## 2023-07-12 DIAGNOSIS — Z955 Presence of coronary angioplasty implant and graft: Secondary | ICD-10-CM

## 2023-07-12 NOTE — Progress Notes (Signed)
Discharge Progress Report  Patient Details  Name: Rachel Odonnell MRN: [id number] Date of Birth: [birth year] Referring Provider:   Flowsheet Row CARDIAC REHAB PHASE II ORIENTATION from 05/19/2023 in [facility]  Referring Provider [place], Peter MD        Number of Visits: 9  Reason for Discharge:  Early Exit:  Personal  Smoking History:  Social History   Tobacco Use  Smoking Status Former   Current packs/day: 0.00   Types: Cigarettes   Quit date: 08/17/1994   Years since quitting: 28.9  Smokeless Tobacco Never    Diagnosis:  Status post primary angioplasty with coronary stent  Initial Exercise Prescription:  Initial Exercise Prescription - 05/19/23 1600       Date of Initial Exercise RX and Referring Provider   Date 05/19/23    Referring Provider [place], Peter MD      Oxygen   Maintain Oxygen Saturation 88% or higher      Treadmill   MPH 1.3    Grade 0    Minutes 15    METs 1.7      NuStep   Level 1    SPM 80    Minutes 15    METs 1.5      REL-XR   Level 1    Speed 50    Minutes 15    METs 1.5      Prescription Details   Frequency (times per week) 3    Duration Progress to 30 minutes of continuous aerobic without signs/symptoms of physical distress      Intensity   THRR 40-80% of Max Heartrate 97-134    Ratings of Perceived Exertion 11-13    Perceived Dyspnea 0-4      Resistance Training   Training Prescription Yes    Weight 2 lbs    Reps 10-15             Discharge Exercise Prescription (Final Exercise Prescription Changes):  Exercise Prescription Changes - 05/31/23 1500       Response to Exercise   Blood Pressure (Admit) 136/62    Blood Pressure (Exercise) 138/74    Blood Pressure (Exit) 106/64    Heart Rate (Admit) 79 bpm    Heart Rate (Exercise) 104 bpm    Heart Rate (Exit) 68 bpm    Rating of Perceived Exertion (Exercise) 13    Duration Continue with 30 min of aerobic exercise without signs/symptoms of  physical distress.    Intensity THRR unchanged      Progression   Progression Continue to progress workloads to maintain intensity without signs/symptoms of physical distress.      Resistance Training   Training Prescription Yes    Weight 2 lbs    Reps 10-15      NuStep   Level 2    SPM 99    Minutes 30    METs 1.9      Oxygen   Maintain Oxygen Saturation 88% or higher             Functional Capacity:  6 Minute Walk     Row Name 05/19/23 1600         6 Minute Walk   Phase Initial     Distance 950 feet     Walk Time 6 minutes     # of Rest Breaks 0     MPH 1.79     METS 1.73     RPE 13     Perceived  Dyspnea  1     VO2 Peak 6.08     Symptoms Yes (comment)     Comments BL hip pain 3/10     Resting HR 60 bpm     Resting BP 138/52     Resting Oxygen Saturation  95 %     Exercise Oxygen Saturation  during 6 min walk 92 %     Max Ex. HR 80 bpm     Max Ex. BP 156/50     2 Minute Post BP 136/52              Psychological, QOL, Others - Outcomes: PHQ 2/9:    05/19/2023    2:51 PM 10/10/2021    5:33 PM 07/30/2021    5:19 PM 04/18/2021    5:16 PM 03/28/2021    2:08 PM  Depression screen PHQ 2/9  Decreased Interest 0 0 0 1 1  Down, Depressed, Hopeless 0 1 1 1 1   PHQ - 2 Score 0 1 1 2 2   Altered sleeping 3    1  Tired, decreased energy 2    1  Change in appetite 0    1  Feeling bad or failure about yourself  0    0  Trouble concentrating 0    0  Moving slowly or fidgety/restless 0    0  Suicidal thoughts 0    0  PHQ-9 Score 5    5  Difficult doing work/chores Not difficult at all        Quality of Life:  Quality of Life - 05/19/23 1317       Quality of Life Scores   Health/Function Pre 14.43 %    Socioeconomic Pre 26.44 %    Psych/Spiritual Pre 28.29 %    Family Pre 30 %    GLOBAL Pre 22.17 %            Nutrition & Weight - Outcomes:  Pre Biometrics - 05/19/23 1606       Pre Biometrics   Height 5\' 4"  (1.626 m)    Weight 228 lb 13.4  oz (103.8 kg)    Waist Circumference 46 inches    Hip Circumference 51 inches    Waist to Hip Ratio 0.9 %    BMI (Calculated) 39.26    Grip Strength 22.6 kg    Single Leg Stand 4.63 seconds

## 2023-07-12 NOTE — Progress Notes (Signed)
Cardiac Individual Treatment Plan  Patient Details  Name: Rachel Odonnell MRN: [id number] Date of Birth: [birth year] Referring Provider:   Flowsheet Row CARDIAC REHAB PHASE II ORIENTATION from 05/19/2023 in [facility]  Referring Provider [place], Peter MD       Initial Encounter Date:  Flowsheet Row CARDIAC REHAB PHASE II ORIENTATION from 05/19/2023 in [place] [facility]IAC [facility]  Date 05/19/23       Visit Diagnosis: Status post primary angioplasty with coronary stent  Patient's Home Medications on Admission:  Current Outpatient Medications:    Accu-Chek Softclix Lancets lancets, Use as instructed to test blood glucose four times daily, Disp: 100 each, Rfl: 2   busPIRone (BUSPAR) 5 MG tablet, Take 5 mg by mouth at bedtime., Disp: , Rfl:    Cholecalciferol (VITAMIN D3) 125 MCG (5000 UT) CAPS, Take 1 capsule (5,000 Units total) by mouth daily., Disp: 90 capsule, Rfl: 0   citalopram (CELEXA) 20 MG tablet, Take 1 tablet (20 mg total) by mouth every evening., Disp: 30 tablet, Rfl: 4   clopidogrel (PLAVIX) 75 MG tablet, Take 1 tablet (75 mg total) by mouth daily., Disp: 90 tablet, Rfl: 3   Continuous Blood Gluc Sensor (FREESTYLE LIBRE 2 SENSOR) MISC, Change sensor every 14 days. Use to check glucose continuously, Disp: 2 each, Rfl: 0   Continuous Glucose Receiver (FREESTYLE LIBRE 2 READER) DEVI, USE TO CHECK GLUCOSE AS DIRECTED, Disp: 1 each, Rfl: 0   donepezil (ARICEPT) 5 MG tablet, Take 5 mg by mouth every evening., Disp: , Rfl:    fenofibrate 160 MG tablet, Take 1 tablet (160 mg total) by mouth daily., Disp: 90 tablet, Rfl: 1   gabapentin (NEURONTIN) 100 MG capsule, Take 1 capsule (100 mg total) by mouth at bedtime., Disp: , Rfl:    glipiZIDE (GLUCOTROL) 5 MG tablet, Take 5 mg by mouth daily., Disp: , Rfl:    glucose blood test strip, 1 each by Other route as needed. Use as instructed to test blood glucose four times daily, Disp: 100 each, Rfl: 2   indapamide  (LOZOL) 2.5 MG tablet, Take 1 tablet (2.5 mg total) by mouth daily., Disp: 30 tablet, Rfl: 11   insulin aspart (NOVOLOG FLEXPEN) 100 UNIT/ML FlexPen, Inject 14-20 Units into the skin 3 (three) times daily with meals., Disp: 45 mL, Rfl: 3   Insulin Pen Needle (B-D ULTRAFINE III SHORT PEN) 31G X 8 MM MISC, 1 each by Does not apply route as directed., Disp: 100 each, Rfl: 2   isosorbide mononitrate (IMDUR) 60 MG 24 hr tablet, Take 1.5 tablets (90 mg total) by mouth daily. Can take an additional half tab as needed for chest pain, Disp: 135 tablet, Rfl: 1   levocetirizine (XYZAL) 5 MG tablet, Take 5 mg by mouth every evening., Disp: , Rfl:    levothyroxine (SYNTHROID) 50 MCG tablet, TAKE 1 TABLET (50 MCG TOTAL) BY MOUTH DAILY BEFORE BREAKFAST., Disp: 90 tablet, Rfl: 3   metFORMIN (GLUCOPHAGE) 500 MG tablet, TAKE 1 TABLET TWICE DAILY WITH MEALS, Disp: 180 tablet, Rfl: 1   mirabegron ER (MYRBETRIQ) 25 MG TB24 tablet, Take 1 tablet (25 mg total) by mouth daily., Disp: 30 tablet, Rfl: 11   modafinil (PROVIGIL) 100 MG tablet, TAKE 1 TABLET EVERY DAY, Disp: 90 tablet, Rfl: 1   montelukast (SINGULAIR) 10 MG tablet, Take 10 mg by mouth at bedtime., Disp: , Rfl:    Multiple Vitamin (MULTIVITAMIN ADULT PO), Take 1 tablet by mouth daily., Disp: , Rfl:  nitroGLYCERIN (NITROSTAT) 0.4 MG SL tablet, Place 1 tablet (0.4 mg total) under the tongue every 5 (five) minutes as needed for chest pain., Disp: 25 tablet, Rfl: 3   OVER THE COUNTER MEDICATION, Take 2 capsules by mouth daily. Omega XL, Disp: , Rfl:    pantoprazole (PROTONIX) 40 MG tablet, Take 2 tablets (80 mg total) by mouth daily., Disp: 60 tablet, Rfl: 4   rOPINIRole (REQUIP) 2 MG tablet, Take 2 mg by mouth 2 (two) times daily., Disp: , Rfl:    rosuvastatin (CRESTOR) 40 MG tablet, Take 1 tablet (40 mg total) by mouth daily., Disp: 30 tablet, Rfl: 5   TRESIBA FLEXTOUCH 200 UNIT/ML FlexTouch Pen, INJECT 100 UNITS INTO THE SKIN AT BEDTIME., Disp: 45 mL, Rfl: 3    warfarin (COUMADIN) 5 MG tablet, TAKE 1/2 TO 1 TABLET BY MOUTH DAILY AS DIRECTED BY THE COUMADIN CLINIC., Disp: 65 tablet, Rfl: 1  Past Medical History: Past Medical History:  Diagnosis Date   ADD (attention deficit disorder)    Diabetes mellitus without complication (HCC)    Hypertension     Tobacco Use: Social History   Tobacco Use  Smoking Status Former   Current packs/day: 0.00   Types: Cigarettes   Quit date: 08/17/1994   Years since quitting: 28.9  Smokeless Tobacco Never    Labs: Review Flowsheet  More data exists      Latest Ref Rng & Units 12/01/2022 02/19/2023 03/04/2023 04/16/2023 06/11/2023  Labs for ITP Cardiac and Pulmonary Rehab  Cholestrol 100 - 199 mg/dL - 865  - - 784   LDL (calc) 0 - 99 mg/dL - 91  - - 44   HDL-C >69 mg/dL - 43  - - 25   Trlycerides 0 - 149 mg/dL - 629  - - 528   Hemoglobin A1c 4.8 - 5.6 % 10.4  - 10.2  11.4  -  PH, Arterial 7.35 - 7.45 - - - 7.375  -  PCO2 arterial 32 - 48 mmHg - - - 41.9  -  Bicarbonate 20.0 - 28.0 mmol/L - - - 27.1  27.2  24.6  -  TCO2 22 - 32 mmol/L - - - 29  29  26   -  Acid-base deficit 0.0 - 2.0 mmol/L - - - 1.0  -  O2 Saturation % - - - 62  63  97  -    Details       Multiple values from one day are sorted in reverse-chronological order         Capillary Blood Glucose: Lab Results  Component Value Date   GLUCAP 255 (H) 06/04/2023   GLUCAP 230 (H) 05/31/2023   GLUCAP 286 (H) 05/31/2023   GLUCAP 304 (H) 04/17/2023   GLUCAP 292 (H) 04/17/2023     Exercise Target Goals: Exercise Program Goal: Individual exercise prescription set using results from initial 6 min walk test and THRR while considering  patient's activity barriers and safety.   Exercise Prescription Goal: Starting with aerobic activity 30 plus minutes a day, 3 days per week for initial exercise prescription. Provide home exercise prescription and guidelines that participant acknowledges understanding prior to discharge.  Activity Barriers &  Risk Stratification:  Activity Barriers & Cardiac Risk Stratification - 05/12/23 1359       Activity Barriers & Cardiac Risk Stratification   Activity Barriers Shortness of Breath;Arthritis;Back Problems;Chest Pain/Angina;Joint Problems;Balance Concerns;History of Falls;Deconditioning;Muscular Weakness    Cardiac Risk Stratification Moderate  6 Minute Walk:  6 Minute Walk     Row Name 05/19/23 1600         6 Minute Walk   Phase Initial     Distance 950 feet     Walk Time 6 minutes     # of Rest Breaks 0     MPH 1.79     METS 1.73     RPE 13     Perceived Dyspnea  1     VO2 Peak 6.08     Symptoms Yes (comment)     Comments BL hip pain 3/10     Resting HR 60 bpm     Resting BP 138/52     Resting Oxygen Saturation  95 %     Exercise Oxygen Saturation  during 6 min walk 92 %     Max Ex. HR 80 bpm     Max Ex. BP 156/50     2 Minute Post BP 136/52              Oxygen Initial Assessment:   Oxygen Re-Evaluation:   Oxygen Discharge (Final Oxygen Re-Evaluation):   Initial Exercise Prescription:  Initial Exercise Prescription - 05/19/23 1600       Date of Initial Exercise RX and Referring Provider   Date 05/19/23    Referring Provider [place], Peter MD      Oxygen   Maintain Oxygen Saturation 88% or higher      Treadmill   MPH 1.3    Grade 0    Minutes 15    METs 1.7      NuStep   Level 1    SPM 80    Minutes 15    METs 1.5      REL-XR   Level 1    Speed 50    Minutes 15    METs 1.5      Prescription Details   Frequency (times per week) 3    Duration Progress to 30 minutes of continuous aerobic without signs/symptoms of physical distress      Intensity   THRR 40-80% of Max Heartrate 97-134    Ratings of Perceived Exertion 11-13    Perceived Dyspnea 0-4      Resistance Training   Training Prescription Yes    Weight 2 lbs    Reps 10-15             Perform Capillary Blood Glucose checks as needed.  Exercise  Prescription Changes:   Exercise Prescription Changes     Row Name 05/19/23 1600 05/31/23 1500           Response to Exercise   Blood Pressure (Admit) 138/52 136/62      Blood Pressure (Exercise) 156/50 138/74      Blood Pressure (Exit) 136/52 106/64      Heart Rate (Admit) 60 bpm 79 bpm      Heart Rate (Exercise) 80 bpm 104 bpm      Heart Rate (Exit) 70 bpm 68 bpm      Oxygen Saturation (Admit) 95 % --      Oxygen Saturation (Exercise) 92 % --      Oxygen Saturation (Exit) 97 % --      Rating of Perceived Exertion (Exercise) 13 13      Perceived Dyspnea (Exercise) 1 --      Duration Continue with 30 min of aerobic exercise without signs/symptoms of physical distress. Continue with 30 min of aerobic exercise without signs/symptoms  of physical distress.      Intensity THRR unchanged THRR unchanged        Progression   Progression Continue to progress workloads to maintain intensity without signs/symptoms of physical distress. Continue to progress workloads to maintain intensity without signs/symptoms of physical distress.        Resistance Training   Training Prescription -- Yes      Weight -- 2 lbs      Reps -- 10-15        NuStep   Level -- 2      SPM -- 99      Minutes -- 30      METs -- 1.9        Oxygen   Maintain Oxygen Saturation -- 88% or higher               Exercise Comments:   Exercise Goals and Review:   Exercise Goals     Row Name 05/19/23 1606             Exercise Goals   Increase Physical Activity Yes       Intervention Provide advice, education, support and counseling about physical activity/exercise needs.;Develop an individualized exercise prescription for aerobic and resistive training based on initial evaluation findings, risk stratification, comorbidities and participant's personal goals.       Expected Outcomes Short Term: Attend rehab on a regular basis to increase amount of physical activity.;Long Term: Add in home exercise to make  exercise part of routine and to increase amount of physical activity.;Long Term: Exercising regularly at least 3-5 days a week.       Increase Strength and Stamina Yes       Intervention Provide advice, education, support and counseling about physical activity/exercise needs.;Develop an individualized exercise prescription for aerobic and resistive training based on initial evaluation findings, risk stratification, comorbidities and participant's personal goals.       Expected Outcomes Short Term: Increase workloads from initial exercise prescription for resistance, speed, and METs.;Long Term: Improve cardiorespiratory fitness, muscular endurance and strength as measured by increased METs and functional capacity ( );Short Term: Perform resistance training exercises routinely during rehab and add in resistance training at home       Able to understand and use rate of perceived exertion (RPE) scale Yes       Intervention Provide education and explanation on how to use RPE scale       Expected Outcomes Short Term: Able to use RPE daily in rehab to express subjective intensity level;Long Term:  Able to use RPE to guide intensity level when exercising independently       Able to understand and use Dyspnea scale Yes       Intervention Provide education and explanation on how to use Dyspnea scale       Expected Outcomes Short Term: Able to use Dyspnea scale daily in rehab to express subjective sense of shortness of breath during exertion;Long Term: Able to use Dyspnea scale to guide intensity level when exercising independently       Knowledge and understanding of Target Heart Rate Range (THRR) Yes       Intervention Provide education and explanation of THRR including how the numbers were predicted and where they are located for reference       Expected Outcomes Short Term: Able to state/look up THRR;Long Term: Able to use THRR to govern intensity when exercising independently;Short Term: Able to use daily  as guideline for intensity in rehab  Able to check pulse independently Yes       Intervention Review the importance of being able to check your own pulse for safety during independent exercise;Provide education and demonstration on how to check pulse in carotid and radial arteries.       Expected Outcomes Short Term: Able to explain why pulse checking is important during independent exercise;Long Term: Able to check pulse independently and accurately       Understanding of Exercise Prescription Yes       Intervention Provide education, explanation, and written materials on patient's individual exercise prescription       Expected Outcomes Short Term: Able to explain program exercise prescription;Long Term: Able to explain home exercise prescription to exercise independently                Exercise Goals Re-Evaluation :    Discharge Exercise Prescription (Final Exercise Prescription Changes):  Exercise Prescription Changes - 05/31/23 1500       Response to Exercise   Blood Pressure (Admit) 136/62    Blood Pressure (Exercise) 138/74    Blood Pressure (Exit) 106/64    Heart Rate (Admit) 79 bpm    Heart Rate (Exercise) 104 bpm    Heart Rate (Exit) 68 bpm    Rating of Perceived Exertion (Exercise) 13    Duration Continue with 30 min of aerobic exercise without signs/symptoms of physical distress.    Intensity THRR unchanged      Progression   Progression Continue to progress workloads to maintain intensity without signs/symptoms of physical distress.      Resistance Training   Training Prescription Yes    Weight 2 lbs    Reps 10-15      NuStep   Level 2    SPM 99    Minutes 30    METs 1.9      Oxygen   Maintain Oxygen Saturation 88% or higher             Nutrition:  Target Goals: Understanding of nutrition guidelines, daily intake of sodium 1500mg , cholesterol 200mg , calories 30% from fat and 7% or less from saturated fats, daily to have 5 or more servings  of fruits and vegetables.  Biometrics:  Pre Biometrics - 05/19/23 1606       Pre Biometrics   Height 5\' 4"  (1.626 m)    Weight 228 lb 13.4 oz (103.8 kg)    Waist Circumference 46 inches    Hip Circumference 51 inches    Waist to Hip Ratio 0.9 %    BMI (Calculated) 39.26    Grip Strength 22.6 kg    Single Leg Stand 4.63 seconds              Nutrition Therapy Plan and Nutrition Goals:  Nutrition Therapy & Goals - 05/12/23 1410       Personal Nutrition Goals   Comments The pt eats 2 meals a day and sometimes eats snacks in between.  She tries to eat healthy but gets confused about what she can eat.  The pt eats chicken and fish, but she does eat red meat at times.  She tries to eat mainly grilled meat vs fried.      Intervention Plan   Intervention Nutrition handout(s) given to patient.    Expected Outcomes Short Term Goal: Understand basic principles of dietary content, such as calories, fat, sodium, cholesterol and nutrients.;Long Term Goal: Adherence to prescribed nutrition plan.  Nutrition Assessments:  MEDIFICTS Score Key: >=70 Need to make dietary changes  40-70 Heart Healthy Diet <= 40 Therapeutic Level Cholesterol Diet  Flowsheet Row CARDIAC REHAB PHASE II ORIENTATION from 05/19/2023 in [facility] C[facility]IAC [facility]  Picture Your Plate Total Score on Admission 44      Picture Your Plate Scores: <53 Unhealthy dietary pattern with much room for improvement. 41-50 Dietary pattern unlikely to meet recommendations for good health and room for improvement. 51-60 More healthful dietary pattern, with some room for improvement.  >60 Healthy dietary pattern, although there may be some specific behaviors that could be improved.    Nutrition Goals Re-Evaluation:   Nutrition Goals Discharge (Final Nutrition Goals Re-Evaluation):   Psychosocial: Target Goals: Acknowledge presence or absence of significant depression and/or stress, maximize coping  skills, provide positive support system. Participant is able to verbalize types and ability to use techniques and skills needed for reducing stress and depression.  Initial Review & Psychosocial Screening:  Initial Psych Review & Screening - 05/12/23 1417       Initial Review   Current issues with History of Depression;Current Psychotropic Meds;Current Sleep Concerns      Family Dynamics   Good Support System? Yes    Comments The pt's husband and her sisters are her main support system.      Barriers   Psychosocial barriers to participate in program There are no identifiable barriers or psychosocial needs.      Screening Interventions   Interventions Encouraged to exercise;Provide feedback about the scores to participant;To provide support and resources with identified psychosocial needs    Expected Outcomes Long Term Goal: Stressors or current issues are controlled or eliminated.;Short Term goal: Identification and review with participant of any Quality of Life or Depression concerns found by scoring the questionnaire.;Long Term goal: The participant improves quality of Life and PHQ9 Scores as seen by post scores and/or verbalization of changes             Quality of Life Scores:  Quality of Life - 05/19/23 1317       Quality of Life Scores   Health/Function Pre 14.43 %    Socioeconomic Pre 26.44 %    Psych/Spiritual Pre 28.29 %    Family Pre 30 %    GLOBAL Pre 22.17 %            Scores of 19 and below usually indicate a poorer quality of life in these areas.  A difference of  2-3 points is a clinically meaningful difference.  A difference of 2-3 points in the total score of the Quality of Life Index has been associated with significant improvement in overall quality of life, self-image, physical symptoms, and general health in studies assessing change in quality of life.  PHQ-9: Review Flowsheet  More data may exist      05/19/2023 10/10/2021 07/30/2021 04/18/2021  03/28/2021  Depression screen PHQ 2/9  Decreased Interest 0 0 0 1 1  Down, Depressed, Hopeless 0 1 1 1 1   PHQ - 2 Score 0 1 1 2 2   Altered sleeping 3 - - - 1  Tired, decreased energy 2 - - - 1  Change in appetite 0 - - - 1  Feeling bad or failure about yourself  0 - - - 0  Trouble concentrating 0 - - - 0  Moving slowly or fidgety/restless 0 - - - 0  Suicidal thoughts 0 - - - 0  PHQ-9 Score 5 - - - 5  Difficult doing work/chores Not difficult at all - - - -    Details           Interpretation of Total Score  Total Score Depression Severity:  1-4 = Minimal depression, 5-9 = Mild depression, 10-14 = Moderate depression, 15-19 = Moderately severe depression, 20-27 = Severe depression   Psychosocial Evaluation and Intervention:  Psychosocial Evaluation - 05/12/23 1420       Psychosocial Evaluation & Interventions   Interventions Encouraged to exercise with the program and follow exercise prescription;Stress management education;Relaxation education    Comments Pt was referred to cardiac rehab since she had primary angioplasty with a coronary stent.  She denies anxiety/depression or suicidal thoughts, and she states that she did have a history of depression but was referred to a psyschiatrist at [facility] in [place].  She states that talking to the psychiatrist made her feel like a "weight was lifted."  She takes Buspar and  Celexa, as well Modafinil to keep her awake during the day.  The pt states that if she does not take her Modafinil in the morning, then she will sleep for a large portion of the day.  The fatigue and sleepiness make it hard for her to get motivivated to exercise, and she has jaw pain if she pushes herself too hard with exercise (she stated MD is aware).  She has a good support system in her husband and sisters, and they encourage her to get active at home.  Her goals for the program are to increase her strength/endurance so that she will feel better, and she wants to lose  weight and be able to walk with less shortness of breath.    Continue Psychosocial Services  Follow up required by staff             Psychosocial Re-Evaluation:   Psychosocial Discharge (Final Psychosocial Re-Evaluation):   Vocational Rehabilitation: Provide vocational rehab assistance to qualifying candidates.   Vocational Rehab Evaluation & Intervention:  Vocational Rehab - 05/12/23 1416       Vocational Rehab Re-Evaulation   Comments Pt is retired and has no plans to return to work.             Education: Education Goals: Education classes will be provided on a weekly basis, covering required topics. Participant will state understanding/return demonstration of topics presented.  Learning Barriers/Preferences:  Learning Barriers/Preferences - 05/12/23 1414       Learning Barriers/Preferences   Learning Barriers Exercise Concerns   memory impairment per pt   Learning Preferences Verbal Instruction;Skilled Demonstration;Video             Education Topics: Hypertension, Hypertension Reduction -Define heart disease and high blood pressure. Discus how high blood pressure affects the body and ways to reduce high blood pressure.   Exercise and Your Heart -Discuss why it is important to exercise, the FITT principles of exercise, normal and abnormal responses to exercise, and how to exercise safely.   Angina -Discuss definition of angina, causes of angina, treatment of angina, and how to decrease risk of having angina.   Cardiac Medications -Review what the following cardiac medications are used for, how they affect the body, and side effects that may occur when taking the medications.  Medications include Aspirin, Beta blockers, calcium channel blockers, ACE Inhibitors, angiotensin receptor blockers, diuretics, digoxin, and antihyperlipidemics.   Congestive Heart Failure -Discuss the definition of CHF, how to live with CHF, the signs and symptoms of CHF,  and how keep track  of weight and sodium intake.   Heart Disease and Intimacy -Discus the effect sexual activity has on the heart, how changes occur during intimacy as we age, and safety during sexual activity.   Smoking Cessation / COPD -Discuss different methods to quit smoking, the health benefits of quitting smoking, and the definition of COPD.   Nutrition I: Fats -Discuss the types of cholesterol, what cholesterol does to the heart, and how cholesterol levels can be controlled. Flowsheet Row CARDIAC REHAB PHASE II EXERCISE from 06/09/2023 in [place] CARDIAC REHABILITATION  Date 06/02/23  Educator hb  Instruction Review Code 1- Verbalizes Understanding       Nutrition II: Labels -Discuss the different components of food labels and how to read food label   Heart Parts/Heart Disease and PAD -Discuss the anatomy of the heart, the pathway of blood circulation through the heart, and these are affected by heart disease.   Stress I: Signs and Symptoms -Discuss the causes of stress, how stress may lead to anxiety and depression, and ways to limit stress. Flowsheet Row CARDIAC REHAB PHASE II EXERCISE from 06/09/2023 in [place] CARDIAC REHABILITATION  Date 06/09/23  Educator HB  Instruction Review Code 1- Verbalizes Understanding       Stress II: Relaxation -Discuss different types of relaxation techniques to limit stress.   Warning Signs of Stroke / TIA -Discuss definition of a stroke, what the signs and symptoms are of a stroke, and how to identify when someone is having stroke.   Knowledge Questionnaire Score:  Knowledge Questionnaire Score - 05/12/23 1449       Knowledge Questionnaire Score   Pre Score 21             Core Components/Risk Factors/Patient Goals at Admission:  Personal Goals and Risk Factors at Admission - 05/12/23 1421       Core Components/Risk Factors/Patient Goals on Admission    Weight Management Yes;Weight Loss;Obesity     Intervention Weight Management/Obesity: Establish reasonable short term and long term weight goals.;Obesity: Provide education and appropriate resources to help participant work on and attain dietary goals.    Expected Outcomes Short Term: Continue to assess and modify interventions until short term weight is achieved;Long Term: Adherence to nutrition and physical activity/exercise program aimed toward attainment of established weight goal;Weight Loss: Understanding of general recommendations for a balanced deficit meal plan, which promotes 1-2 lb weight loss per week and includes a negative energy balance of [id number] kcal/d;Understanding recommendations for meals to include 15-35% energy as protein, 25-35% energy from fat, 35-60% energy from carbohydrates, less than 200mg  of dietary cholesterol, 20-35 gm of total fiber daily    Improve shortness of breath with ADL's Yes    Intervention Provide education, individualized exercise plan and daily activity instruction to help decrease symptoms of SOB with activities of daily living.    Expected Outcomes Short Term: Improve cardiorespiratory fitness to achieve a reduction of symptoms when performing ADLs;Long Term: Be able to perform more ADLs without symptoms or delay the onset of symptoms    Diabetes Yes    Intervention Provide education about signs/symptoms and action to take for hypo/hyperglycemia.;Provide education about proper nutrition, including hydration, and aerobic/resistive exercise prescription along with prescribed medications to achieve blood glucose in normal ranges: Fasting glucose 65-99 mg/dL    Expected Outcomes Short Term: Participant verbalizes understanding of the signs/symptoms and immediate care of hyper/hypoglycemia, proper foot care and importance of medication, aerobic/resistive exercise and nutrition plan for blood glucose control.;Long Term: Attainment  of HbA1C < 7%.    Hypertension Yes    Intervention Provide education on lifestyle  modifcations including regular physical activity/exercise, weight management, moderate sodium restriction and increased consumption of fresh fruit, vegetables, and low fat dairy, alcohol moderation, and smoking cessation.;Monitor prescription use compliance.    Expected Outcomes Short Term: Continued assessment and intervention until BP is < 140/56mm HG in hypertensive participants. < 130/61mm HG in hypertensive participants with diabetes, heart failure or chronic kidney disease.;Long Term: Maintenance of blood pressure at goal levels.    Lipids Yes    Intervention Provide education and support for participant on nutrition & aerobic/resistive exercise along with prescribed medications to achieve LDL 70mg , HDL >40mg .    Expected Outcomes Short Term: Participant states understanding of desired cholesterol values and is compliant with medications prescribed. Participant is following exercise prescription and nutrition guidelines.;Long Term: Cholesterol controlled with medications as prescribed, with individualized exercise RX and with personalized nutrition plan. Value goals: LDL < 70mg , HDL > 40 mg.    Personal Goal Other Yes    Personal Goal The pt wants to get "back to normal."  The pt wants to lose weight and be able to walk without getting so short of breath.  She wants to increase her strength/endurance to feel better.    Intervention Pt will attend CR 3 times a week and start a home exercise routine.    Expected Outcomes Pt will meet both her personal and program goals.             Core Components/Risk Factors/Patient Goals Review:    Core Components/Risk Factors/Patient Goals at Discharge (Final Review):    ITP Comments:  ITP Comments     Row Name 05/19/23 1612 05/26/23 1204 05/31/23 1448 06/23/23 0800 06/23/23 1254   ITP Comments Patient arrived for 1st visit/orientation/education at 1400. Patient was referred to CR by Dr. Peter [place] due to S/P primary angioplasty with stent  placement. During orientation advised patient on arrival and appointment times what to wear, what to do before, during and after exercise. Reviewed attendance and class policy.  Pt is scheduled to return Cardiac Rehab on 05/24/23 at 1430. Pt was advised to come to class 15 minutes before class starts.  Discussed RPE/Dpysnea scales. Patient participated in warm up stretches. Patient was able to complete 6 minute walk test.  Telemetry:NSR. Patient was measured for the equipment. Discussed equipment safety with patient. Took patient pre-anthropometric measurements. Patient finished visit at 1315. 30 day review completed. ITP sent to Dr. Dina Rich, Medical Director of Cardiac Rehab. Continue with ITP unless changes are made by physician.  Pt has only completed orientation thus far. First full day of exercise!  Patient was oriented to gym and equipment including functions, settings, policies, and procedures.  Patient's individual exercise prescription and treatment plan were reviewed.  All starting workloads were established based on the results of the 6 minute walk test done at initial orientation visit.  The plan for exercise progression was also introduced and progression will be customized based on patient's performance and goals. 30 day review completed. ITP sent to Dr. Dina Rich, Medical Director of Cardiac Rehab. Continue with ITP unless changes are made by physician.  Still newer to program and was out on vacation last week and has yet to return.  Have not been able to assess for goals yet. Marijah called to let us know that she still was not feeling well.  She tested for COVID and her test was positive.  She was encouraged to contact her doctor for help with symptom management.  She was also told to stay out for 10 days from positive test day (today 11/6).  She may return to rehab on 07/05/23 if feeling better and symptom free. She was also advised to stay home for 5 days.    Row Name 07/12/23 1447            ITP Comments Tashai never returned from being out with COVID.  We attempted a call and she did not return call. Upon reviewing chart, it was noted that she told her NP that she would like to stop rehab and just exercise on her own.  We will discharge her at his time.                Comments: Discharge ITP

## 2023-07-14 ENCOUNTER — Ambulatory Visit (INDEPENDENT_AMBULATORY_CARE_PROVIDER_SITE_OTHER): Payer: Medicare HMO | Admitting: "Endocrinology

## 2023-07-14 ENCOUNTER — Encounter (HOSPITAL_COMMUNITY): Payer: Medicare HMO

## 2023-07-14 ENCOUNTER — Encounter: Payer: Self-pay | Admitting: "Endocrinology

## 2023-07-14 VITALS — BP 126/48 | HR 60 | Ht 64.0 in | Wt 223.0 lb

## 2023-07-14 DIAGNOSIS — I10 Essential (primary) hypertension: Secondary | ICD-10-CM

## 2023-07-14 DIAGNOSIS — E039 Hypothyroidism, unspecified: Secondary | ICD-10-CM | POA: Diagnosis not present

## 2023-07-14 DIAGNOSIS — E782 Mixed hyperlipidemia: Secondary | ICD-10-CM | POA: Diagnosis not present

## 2023-07-14 DIAGNOSIS — E1159 Type 2 diabetes mellitus with other circulatory complications: Secondary | ICD-10-CM | POA: Diagnosis not present

## 2023-07-14 DIAGNOSIS — Z794 Long term (current) use of insulin: Secondary | ICD-10-CM | POA: Diagnosis not present

## 2023-07-14 LAB — POCT GLYCOSYLATED HEMOGLOBIN (HGB A1C): HbA1c, POC (controlled diabetic range): 10.6 % — AB (ref 0.0–7.0)

## 2023-07-14 MED ORDER — METFORMIN HCL 500 MG PO TABS: 500.0000 mg | ORAL_TABLET | Freq: Two times a day (BID) | ORAL | 1 refills | Status: DC

## 2023-07-14 MED ORDER — TRESIBA FLEXTOUCH 200 UNIT/ML ~~LOC~~ SOPN: 110.0000 [IU] | PEN_INJECTOR | Freq: Every day | SUBCUTANEOUS | 3 refills | Status: DC

## 2023-07-14 NOTE — Patient Instructions (Signed)

## 2023-07-14 NOTE — Progress Notes (Signed)
07/14/2023, 10:58 AM   Endocrinology follow-up note  Subjective:    Patient ID: Rachel Odonnell, female    DOB: [birth year].  Rachel Odonnell is being seen in follow-up after she was seen in consultation for  management of currently uncontrolled, symptomatic type 2 diabetes requested by  Rachel Shivers, PA-C.   Past Medical History:  Diagnosis Date   ADD (attention deficit disorder)    Diabetes mellitus without complication (HCC)    Hypertension     Past Surgical History:  Procedure Laterality Date   ABDOMINAL HYSTERECTOMY     COLONOSCOPY  02/2022   CORONARY PRESSURE/FFR STUDY N/A 04/16/2023   Procedure: CORONARY PRESSURE/FFR STUDY;  Surgeon: Rachel, Peter M, MD;  Location: [facility] INVASIVE CV LAB;  Service: Cardiovascular;  Laterality: N/A;   CORONARY STENT INTERVENTION N/A 04/16/2023   Procedure: CORONARY STENT INTERVENTION;  Surgeon: Rachel, Peter M, MD;  Location: [facility] INVASIVE CV LAB;  Service: Cardiovascular;  Laterality: N/A;   CORONARY ULTRASOUND/IVUS N/A 04/16/2023   Procedure: Coronary Ultrasound/IVUS;  Surgeon: Rachel, Peter M, MD;  Location: [facility] INVASIVE CV LAB;  Service: Cardiovascular;  Laterality: N/A;   EXCISIONAL HEMORRHOIDECTOMY     INCONTINENCE SURGERY     RIGHT/LEFT HEART CATH AND CORONARY ANGIOGRAPHY N/A 06/13/2021   Procedure: RIGHT/LEFT HEART CATH AND CORONARY ANGIOGRAPHY;  Surgeon: Rachel, Peter M, MD;  Location: [facility] INVASIVE CV LAB;  Service: Cardiovascular;  Laterality: N/A;   RIGHT/LEFT HEART CATH AND CORONARY ANGIOGRAPHY N/A 04/16/2023   Procedure: RIGHT/LEFT HEART CATH AND CORONARY ANGIOGRAPHY;  Surgeon: Rachel, Peter M, MD;  Location: [facility] INVASIVE CV LAB;  Service: Cardiovascular;  Laterality: N/A;    Social History   Socioeconomic History   Marital status: Married    Spouse name: Rachel Odonnell   Number of children: Not on file   Years of education: Not on file   Highest education  level: Not on file  Occupational History   Not on file  Tobacco Use   Smoking status: Former    Current packs/day: 0.00    Types: Cigarettes    Quit date: 08/17/1994    Years since quitting: 28.9   Smokeless tobacco: Never  Vaping Use   Vaping status: Never Used  Substance and Sexual Activity   Alcohol use: No   Drug use: No   Sexual activity: Yes  Other Topics Concern   Not on file  Social History Narrative   Lives at home with husband, Rachel Odonnell   Former smoker Smoked 2 ppd x 25 yrs; Quit in 1996   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, retail  retired Secondary school teacher, still works part-time at the [employer], used to work Editor, commissioning   Now on fixed income from her SSI and pension   Social Determinants of Health   Financial Resource Strain: Low Risk  (10/10/2021)   Overall Financial Resource Strain (CARDIA)    Difficulty of Paying Living Expenses: Not hard at all  Food Insecurity: No Food Insecurity (04/16/2023)   Hunger Vital Sign    Worried About Running Out of Food in the Last Year: Never true    Ran  Out of Food in the Last Year: Never true  Transportation Needs: No Transportation Needs (04/16/2023)   PRAPARE - Administrator, Civil Service (Medical): No    Lack of Transportation (Non-Medical): No  Physical Activity: Inactive (04/08/2023)   Received from [facility]   Exercise Vital Sign    Days of Exercise per Week: 0 days    Minutes of Exercise per Session: 0 min  Stress: No Stress Concern Present (10/10/2021)   [employer] of Occupational Health - Occupational Stress Questionnaire    Feeling of Stress : Only a little  Social Connections: Socially Integrated (10/10/2021)   Social Connection and Isolation Panel [NHANES]    Frequency of Communication with Friends and Family: More than three times a week    Frequency of Social Gatherings with Friends and Family: More than three times a week    Attends Religious Services: More  than 4 times per year    Active Member of [employer] or Organizations: Yes    Attends Engineer, structural: More than 4 times per year    Marital Status: Married    Family History  Problem Relation Age of Onset   Cancer Mother    Diabetes Father    Heart Problems Father    COPD Father    Emphysema Father    Heart block Father    Congestive Heart Failure Father     Outpatient Encounter Medications as of 07/14/2023  Medication Sig   Accu-Chek Softclix Lancets lancets Use as instructed to test blood glucose four times daily   busPIRone (BUSPAR) 5 MG tablet Take 5 mg by mouth at bedtime.   Cholecalciferol (VITAMIN D3) 125 MCG (5000 UT) CAPS Take 1 capsule (5,000 Units total) by mouth daily.   citalopram (CELEXA) 20 MG tablet Take 1 tablet (20 mg total) by mouth every evening.   clopidogrel (PLAVIX) 75 MG tablet Take 1 tablet (75 mg total) by mouth daily.   Continuous Blood Gluc Sensor (FREESTYLE LIBRE 2 SENSOR) MISC Change sensor every 14 days. Use to check glucose continuously   Continuous Glucose Receiver (FREESTYLE LIBRE 2 READER) DEVI USE TO CHECK GLUCOSE AS DIRECTED   donepezil (ARICEPT) 5 MG tablet Take 5 mg by mouth every evening.   fenofibrate 160 MG tablet Take 1 tablet (160 mg total) by mouth daily.   gabapentin (NEURONTIN) 100 MG capsule Take 1 capsule (100 mg total) by mouth at bedtime.   glipiZIDE (GLUCOTROL) 5 MG tablet Take 5 mg by mouth daily.   glucose blood test strip 1 each by Other route as needed. Use as instructed to test blood glucose four times daily   indapamide (LOZOL) 2.5 MG tablet Take 1 tablet (2.5 mg total) by mouth daily.   insulin aspart (NOVOLOG FLEXPEN) 100 UNIT/ML FlexPen Inject 14-20 Units into the skin 3 (three) times daily with meals.   insulin degludec (TRESIBA FLEXTOUCH) 200 UNIT/ML FlexTouch Pen Inject 110 Units into the skin at bedtime.   Insulin Pen Needle (B-D ULTRAFINE III SHORT PEN) 31G X 8 MM MISC 1 each by Does not apply route as  directed.   isosorbide mononitrate (IMDUR) 60 MG 24 hr tablet Take 1.5 tablets (90 mg total) by mouth daily. Can take an additional half tab as needed for chest pain   levocetirizine (XYZAL) 5 MG tablet Take 5 mg by mouth every evening.   levothyroxine (SYNTHROID) 50 MCG tablet TAKE 1 TABLET (50 MCG TOTAL) BY MOUTH DAILY BEFORE BREAKFAST.   metFORMIN (  GLUCOPHAGE) 500 MG tablet Take 1 tablet (500 mg total) by mouth 2 (two) times daily with a meal.   mirabegron ER (MYRBETRIQ) 25 MG TB24 tablet Take 1 tablet (25 mg total) by mouth daily.   modafinil (PROVIGIL) 100 MG tablet TAKE 1 TABLET EVERY DAY   montelukast (SINGULAIR) 10 MG tablet Take 10 mg by mouth at bedtime.   Multiple Vitamin (MULTIVITAMIN ADULT PO) Take 1 tablet by mouth daily.   nitroGLYCERIN (NITROSTAT) 0.4 MG SL tablet Place 1 tablet (0.4 mg total) under the tongue every 5 (five) minutes as needed for chest pain.   OVER THE COUNTER MEDICATION Take 2 capsules by mouth daily. Omega XL   pantoprazole (PROTONIX) 40 MG tablet Take 2 tablets (80 mg total) by mouth daily.   rOPINIRole (REQUIP) 2 MG tablet Take 2 mg by mouth 2 (two) times daily.   rosuvastatin (CRESTOR) 40 MG tablet Take 1 tablet (40 mg total) by mouth daily.   warfarin (COUMADIN) 5 MG tablet TAKE 1/2 TO 1 TABLET BY MOUTH DAILY AS DIRECTED BY THE COUMADIN CLINIC.   [DISCONTINUED] metFORMIN (GLUCOPHAGE) 500 MG tablet TAKE 1 TABLET TWICE DAILY WITH MEALS (Patient taking differently: Take 500 mg by mouth daily with breakfast.)   [DISCONTINUED] TRESIBA FLEXTOUCH 200 UNIT/ML FlexTouch Pen INJECT 100 UNITS INTO THE SKIN AT BEDTIME.   No facility-administered encounter medications on file as of 07/14/2023.    ALLERGIES: Allergies  Allergen Reactions   Azithromycin Hives   Codeine Nausea Only   Atomoxetine Nausea And Vomiting   Atorvastatin Other (See Comments)    Restless legs   Lisinopril Nausea And Vomiting    VACCINATION STATUS: Immunization History  Administered  Date(s) Administered   Fluad Quad(high Dose 65+) 05/14/2021   Moderna SARS-COV2 Booster Vaccination 06/13/2020   Moderna Sars-Covid-2 Vaccination 09/06/2019, 10/04/2019   Pneumococcal Polysaccharide-23 04/22/2017   Pneumococcal-Unspecified 05/31/2020   Tdap 04/22/2017   Zoster Recombinant(Shingrix) 11/30/2017, 02/20/2018    Diabetes She presents for her follow-up diabetic visit. She has type 2 diabetes mellitus. Onset time: Patient was diagnosed at approximate age of 49 years. Her disease course has been fluctuating. There are no hypoglycemic associated symptoms. Pertinent negatives for hypoglycemia include no confusion, headaches, pallor or seizures. Pertinent negatives for diabetes include no blurred vision, no chest pain, no fatigue, no polydipsia, no polyphagia and no polyuria. There are no hypoglycemic complications. Symptoms are worsening. Diabetic complications include heart disease and peripheral neuropathy. Risk factors for coronary artery disease include dyslipidemia, diabetes mellitus, family history, obesity, post-menopausal, sedentary lifestyle, tobacco exposure and hypertension. Current diabetic treatment includes insulin injections and oral agent (dual therapy). Her weight is decreasing steadily. She is following a generally unhealthy diet. When asked about meal planning, she reported none. She never participates in exercise. Her home blood glucose trend is fluctuating minimally. Her breakfast blood glucose range is generally >200 mg/dl. Her lunch blood glucose range is generally >200 mg/dl. Her dinner blood glucose range is generally >200 mg/dl. Her bedtime blood glucose range is generally >200 mg/dl. Her overall blood glucose range is >200 mg/dl. (She presents with her CGM, showing significantly above target glycemic profile.  Her recent 2-week average is 209, although improving from 293 during her last visit.  Her CGM AGP shows 33% time in range, 45% level 1 hyperglycemia, 22% level 2  hyperglycemia.  Her point-of-care A1c is 10.6%, improving from 11.4% during her last visit.    ) An ACE inhibitor/angiotensin II receptor blocker is not being taken. Eye exam is  current.  Hyperlipidemia This is a chronic problem. The current episode started more than 1 year ago. The problem is uncontrolled. Exacerbating diseases include diabetes, hypothyroidism and obesity. Pertinent negatives include no chest pain, myalgias or shortness of breath. Current antihyperlipidemic treatment includes statins, fibric acid derivatives and bile acid sequestrants. Risk factors for coronary artery disease include family history, dyslipidemia, obesity, a sedentary lifestyle, post-menopausal, hypertension and diabetes mellitus.  Hypertension This is a chronic problem. The current episode started more than 1 year ago. Pertinent negatives include no blurred vision, chest pain, headaches, palpitations or shortness of breath. Risk factors for coronary artery disease include dyslipidemia, diabetes mellitus, family history, obesity, post-menopausal state, smoking/tobacco exposure and sedentary lifestyle. Past treatments include direct vasodilators. Hypertensive end-organ damage includes CAD/MI.    Review of systems  Constitutional: + Minimally fluctuating body weight ,  current Body mass index is 38.28 kg/Odonnell. , no fatigue, no subjective hyperthermia, no subjective hypothermia Eyes: no blurry vision, no xerophthalmia    Objective:    BP (!) 126/48   Pulse 60   Ht 5\' 4"  (1.626 Odonnell)   Wt 223 lb (101.2 kg)   BMI 38.28 kg/Odonnell   Wt Readings from Last 3 Encounters:  07/14/23 223 lb (101.2 kg)  07/06/23 226 lb (102.5 kg)  06/08/23 230 lb 12.8 oz (104.7 kg)    BP Readings from Last 3 Encounters:  07/14/23 (!) 126/48  07/06/23 118/60  06/08/23 (!) 132/52     Physical Exam- Limited  Constitutional:  Body mass index is 38.28 kg/Odonnell. , not in acute distress, normal state of mind Eyes:  EOMI, no  exophthalmos    CMP ( most recent) CMP     Component Value Date/Time   NA 138 07/07/2023 1429   K 3.8 07/07/2023 1429   CL 97 07/07/2023 1429   CO2 24 07/07/2023 1429   GLUCOSE 173 (H) 07/07/2023 1429   GLUCOSE 322 (H) 04/17/2023 0500   BUN 17 07/07/2023 1429   CREATININE 1.02 (H) 07/07/2023 1429   CALCIUM 10.3 07/07/2023 1429   PROT 7.2 07/07/2023 1429   ALBUMIN 4.2 07/07/2023 1429   AST 34 07/07/2023 1429   ALT 21 07/07/2023 1429   ALKPHOS 67 07/07/2023 1429   BILITOT 0.4 07/07/2023 1429   GFRNONAA 52 (L) 04/17/2023 0500   GFRAA >90 07/09/2012 1736    Recent Results (from the past 2160 hour(s))  Protime-INR     Status: Abnormal   Collection Time: 04/16/23  7:00 AM  Result Value Ref Range   Prothrombin Time 15.3 (H) 11.4 - 15.2 seconds   INR 1.2 0.8 - 1.2    Comment: (NOTE) INR goal varies based on device and disease states. Performed at [facility], [address] [place], [place] [address]   Glucose, capillary     Status: Abnormal   Collection Time: 04/16/23  7:19 AM  Result Value Ref Range   Glucose-Capillary 287 (H) 70 - 99 mg/dL    Comment: Glucose reference range applies only to samples taken after fasting for at least 8 hours.   Comment 1 Notify RN    Comment 2 Document in Chart   I-STAT 7, (LYTES, BLD GAS, ICA, H+H)     Status: Abnormal   Collection Time: 04/16/23  9:06 AM  Result Value Ref Range   pH, Arterial 7.375 7.35 - 7.45   pCO2 arterial 41.9 32 - 48 mmHg   pO2, Arterial 90 83 - 108 mmHg   Bicarbonate 24.6 20.0 -  28.0 mmol/L   TCO2 26 22 - 32 mmol/L   O2 Saturation 97 %   Acid-base deficit 1.0 0.0 - 2.0 mmol/L   Sodium 136 135 - 145 mmol/L   Potassium 4.1 3.5 - 5.1 mmol/L   Calcium, Ion 1.28 1.15 - 1.40 mmol/L   HCT 35.0 (L) 36.0 - 46.0 %   Hemoglobin 11.9 (L) 12.0 - 15.0 g/dL   Sample type ARTERIAL   POCT I-Stat EG7     Status: None   Collection Time: 04/16/23  9:10 AM  Result Value Ref Range   pH, Ven 7.322 7.25 - 7.43   pCO2,  Ven 52.5 44 - 60 mmHg   pO2, Ven 36 32 - 45 mmHg   Bicarbonate 27.2 20.0 - 28.0 mmol/L   TCO2 29 22 - 32 mmol/L   O2 Saturation 63 %   Acid-Base Excess 0.0 0.0 - 2.0 mmol/L   Sodium 137 135 - 145 mmol/L   Potassium 4.2 3.5 - 5.1 mmol/L   Calcium, Ion 1.32 1.15 - 1.40 mmol/L   HCT 36.0 36.0 - 46.0 %   Hemoglobin 12.2 12.0 - 15.0 g/dL   Sample type VENOUS    Comment NOTIFIED PHYSICIAN   POCT I-Stat EG7     Status: None   Collection Time: 04/16/23  9:11 AM  Result Value Ref Range   pH, Ven 7.315 7.25 - 7.43   pCO2, Ven 53.1 44 - 60 mmHg   pO2, Ven 36 32 - 45 mmHg   Bicarbonate 27.1 20.0 - 28.0 mmol/L   TCO2 29 22 - 32 mmol/L   O2 Saturation 62 %   Acid-Base Excess 0.0 0.0 - 2.0 mmol/L   Sodium 136 135 - 145 mmol/L   Potassium 4.2 3.5 - 5.1 mmol/L   Calcium, Ion 1.34 1.15 - 1.40 mmol/L   HCT 36.0 36.0 - 46.0 %   Hemoglobin 12.2 12.0 - 15.0 g/dL   Sample type VENOUS   POCT Activated clotting time     Status: None   Collection Time: 04/16/23  9:29 AM  Result Value Ref Range   Activated Clotting Time 324 seconds    Comment: Reference range 74-137 seconds for patients not on anticoagulant therapy.  POCT Activated clotting time     Status: None   Collection Time: 04/16/23  9:55 AM  Result Value Ref Range   Activated Clotting Time 470 seconds    Comment: Reference range 74-137 seconds for patients not on anticoagulant therapy.  POCT Activated clotting time     Status: None   Collection Time: 04/16/23 10:30 AM  Result Value Ref Range   Activated Clotting Time 232 seconds    Comment: Reference range 74-137 seconds for patients not on anticoagulant therapy.  Glucose, capillary     Status: Abnormal   Collection Time: 04/16/23 11:18 AM  Result Value Ref Range   Glucose-Capillary 278 (H) 70 - 99 mg/dL    Comment: Glucose reference range applies only to samples taken after fasting for at least 8 hours.  Hemoglobin A1c     Status: Abnormal   Collection Time: 04/16/23  2:53 PM  Result  Value Ref Range   Hgb A1c MFr Bld 11.4 (H) 4.8 - 5.6 %    Comment: (NOTE)         Prediabetes: 5.7 - 6.4         Diabetes: >6.4         Glycemic control for adults with diabetes: <7.0    Mean Plasma  Glucose 280 mg/dL    Comment: (NOTE) Performed At: [facility] [address] [place], [place] [id number] Jolene Schimke MD WG:[id number]   CBC     Status: Abnormal   Collection Time: 04/16/23  2:53 PM  Result Value Ref Range   WBC 6.4 4.0 - 10.5 K/uL   RBC 3.84 (L) 3.87 - 5.11 MIL/uL   Hemoglobin 12.2 12.0 - 15.0 g/dL   HCT 78.4 69.6 - 29.5 %   MCV 96.4 80.0 - 100.0 fL   MCH 31.8 26.0 - 34.0 pg   MCHC 33.0 30.0 - 36.0 g/dL   RDW 28.4 13.2 - 44.0 %   Platelets 254 150 - 400 K/uL   nRBC 0.0 0.0 - 0.2 %    Comment: Performed at [facility], [address], [place], [place] [address]  Creatinine, serum     Status: Abnormal   Collection Time: 04/16/23  2:53 PM  Result Value Ref Range   Creatinine, Ser 1.10 (H) 0.44 - 1.00 mg/dL   GFR, Estimated 55 (L) >60 mL/min    Comment: (NOTE) Calculated using the CKD-EPI Creatinine Equation (2021) Performed at [facility], [address] [place], [place] [address]   Glucose, capillary     Status: Abnormal   Collection Time: 04/16/23  3:48 PM  Result Value Ref Range   Glucose-Capillary 388 (H) 70 - 99 mg/dL    Comment: Glucose reference range applies only to samples taken after fasting for at least 8 hours.  Glucose, capillary     Status: Abnormal   Collection Time: 04/16/23  7:16 PM  Result Value Ref Range   Glucose-Capillary 297 (H) 70 - 99 mg/dL    Comment: Glucose reference range applies only to samples taken after fasting for at least 8 hours.  Glucose, capillary     Status: Abnormal   Collection Time: 04/16/23  9:28 PM  Result Value Ref Range   Glucose-Capillary 286 (H) 70 - 99 mg/dL    Comment: Glucose reference range applies only to samples taken after fasting for at least 8 hours.  Basic metabolic panel      Status: Abnormal   Collection Time: 04/17/23  5:00 AM  Result Value Ref Range   Sodium 133 (L) 135 - 145 mmol/L   Potassium 4.0 3.5 - 5.1 mmol/L   Chloride 95 (L) 98 - 111 mmol/L   CO2 25 22 - 32 mmol/L   Glucose, Bld 322 (H) 70 - 99 mg/dL    Comment: Glucose reference range applies only to samples taken after fasting for at least 8 hours.   BUN 13 8 - 23 mg/dL   Creatinine, Ser 4.03 (H) 0.44 - 1.00 mg/dL   Calcium 47.4 8.9 - 25.9 mg/dL   GFR, Estimated 52 (L) >60 mL/min    Comment: (NOTE) Calculated using the CKD-EPI Creatinine Equation (2021)    Anion gap 13 5 - 15    Comment: Performed at [facility], [address], [place], [place] [address]  CBC     Status: None   Collection Time: 04/17/23  5:00 AM  Result Value Ref Range   WBC 7.1 4.0 - 10.5 K/uL   RBC 4.06 3.87 - 5.11 MIL/uL   Hemoglobin 12.6 12.0 - 15.0 g/dL   HCT 56.4 33.2 - 95.1 %   MCV 93.1 80.0 - 100.0 fL   MCH 31.0 26.0 - 34.0 pg   MCHC 33.3 30.0 - 36.0 g/dL   RDW 88.4 16.6 -  15.5 %   Platelets 250 150 - 400 K/uL   nRBC 0.0 0.0 - 0.2 %    Comment: Performed at [facility], [address], [place], [place] [address]  Lipoprotein A (LPA)     Status: Abnormal   Collection Time: 04/17/23  5:00 AM  Result Value Ref Range   Lipoprotein (a) 148.0 (H) <75.0 nmol/L    Comment: (NOTE) Note:  Values greater than or equal to 75.0 nmol/L may       indicate an independent risk factor for CHD,       but must be evaluated with caution when applied       to non-Caucasian populations due to the       influence of genetic factors on Lp(a) across       ethnicities. Performed At: [facility] [address] [place], [place] [id number] Jolene Schimke MD XB:[id number]   Glucose, capillary     Status: Abnormal   Collection Time: 04/17/23  7:33 AM  Result Value Ref Range   Glucose-Capillary 292 (H) 70 - 99 mg/dL    Comment: Glucose reference range applies only to samples taken after fasting for at  least 8 hours.  Glucose, capillary     Status: Abnormal   Collection Time: 04/17/23 11:28 AM  Result Value Ref Range   Glucose-Capillary 304 (H) 70 - 99 mg/dL    Comment: Glucose reference range applies only to samples taken after fasting for at least 8 hours.  POCT INR     Status: Abnormal   Collection Time: 04/20/23  3:49 PM  Result Value Ref Range   INR 1.2 (A) 2.0 - 3.0   POC INR    POCT INR     Status: Abnormal   Collection Time: 04/26/23  3:32 PM  Result Value Ref Range   INR 1.8 (A) 2.0 - 3.0   POC INR    POCT INR     Status: Abnormal   Collection Time: 05/12/23  3:18 PM  Result Value Ref Range   INR 4.0 (A) 2.0 - 3.0   POC INR    POCT INR     Status: Normal   Collection Time: 05/25/23  3:50 PM  Result Value Ref Range   INR 2.0 2.0 - 3.0   POC INR    Glucose, capillary     Status: Abnormal   Collection Time: 05/31/23  2:37 PM  Result Value Ref Range   Glucose-Capillary 286 (H) 70 - 99 mg/dL    Comment: Glucose reference range applies only to samples taken after fasting for at least 8 hours.  Glucose, capillary     Status: Abnormal   Collection Time: 05/31/23  3:36 PM  Result Value Ref Range   Glucose-Capillary 230 (H) 70 - 99 mg/dL    Comment: Glucose reference range applies only to samples taken after fasting for at least 8 hours.  Glucose, capillary     Status: Abnormal   Collection Time: 06/04/23  2:35 PM  Result Value Ref Range   Glucose-Capillary 255 (H) 70 - 99 mg/dL    Comment: Glucose reference range applies only to samples taken after fasting for at least 8 hours.  Hepatic function panel     Status: Abnormal   Collection Time: 06/11/23  4:17 PM  Result Value Ref Range   Total Protein 6.9 6.0 - 8.5 g/dL   Albumin 4.3 3.9 - 4.9 g/dL   Bilirubin Total 0.3 0.0 - 1.2 mg/dL  Bilirubin, Direct 0.14 0.00 - 0.40 mg/dL   Alkaline Phosphatase 100 44 - 121 IU/L   AST 44 (H) 0 - 40 IU/L   ALT 29 0 - 32 IU/L  Lipid Profile     Status: Abnormal   Collection Time:  06/11/23  4:20 PM  Result Value Ref Range   Cholesterol, Total 138 100 - 199 mg/dL   Triglycerides 161 (H) 0 - 149 mg/dL   HDL 25 (L) >09 mg/dL   VLDL Cholesterol Cal 69 (H) 5 - 40 mg/dL   LDL Chol Calc (NIH) 44 0 - 99 mg/dL   Chol/HDL Ratio 5.5 (H) 0.0 - 4.4 ratio    Comment:                                   T. Chol/HDL Ratio                                             Men  Women                               1/2 Avg.Risk  3.4    3.3                                   Avg.Risk  5.0    4.4                                2X Avg.Risk  9.6    7.1                                3X Avg.Risk 23.4   11.0   POCT INR     Status: Normal   Collection Time: 06/28/23  3:24 PM  Result Value Ref Range   INR 2.7 2.0 - 3.0   POC INR    Magnesium     Status: Abnormal   Collection Time: 07/07/23  2:29 PM  Result Value Ref Range   Magnesium 1.5 (L) 1.6 - 2.3 mg/dL  Comprehensive metabolic panel     Status: Abnormal   Collection Time: 07/07/23  2:29 PM  Result Value Ref Range   Glucose 173 (H) 70 - 99 mg/dL   BUN 17 8 - 27 mg/dL   Creatinine, Ser 6.04 (H) 0.57 - 1.00 mg/dL   eGFR 60 >54 UJ/WJX/9.14   BUN/Creatinine Ratio 17 12 - 28   Sodium 138 134 - 144 mmol/L   Potassium 3.8 3.5 - 5.2 mmol/L   Chloride 97 96 - 106 mmol/L   CO2 24 20 - 29 mmol/L   Calcium 10.3 8.7 - 10.3 mg/dL   Total Protein 7.2 6.0 - 8.5 g/dL   Albumin 4.2 3.9 - 4.9 g/dL   Globulin, Total 3.0 1.5 - 4.5 g/dL   Bilirubin Total 0.4 0.0 - 1.2 mg/dL   Alkaline Phosphatase 67 44 - 121 IU/L   AST 34 0 - 40 IU/L   ALT 21 0 - 32 IU/L  Thyroid Panel With TSH     Status: None   Collection  Time: 07/07/23  2:29 PM  Result Value Ref Range   TSH 2.270 0.450 - 4.500 uIU/mL   T4, Total 10.4 4.5 - 12.0 ug/dL   T3 Uptake Ratio 28 24 - 39 %   Free Thyroxine Index 2.9 1.2 - 4.9  Vitamin B12     Status: None   Collection Time: 07/07/23  2:29 PM  Result Value Ref Range   Vitamin B-12 600 232 - 1,245 pg/mL  HgB A1c     Status: Abnormal    Collection Time: 07/14/23 10:20 AM  Result Value Ref Range   Hemoglobin A1C     HbA1c POC (<> result, manual entry)     HbA1c, POC (prediabetic range)     HbA1c, POC (controlled diabetic range) 10.6 (A) 0.0 - 7.0 %     Assessment & Plan:   1) DM type 2 causing vascular disease (HCC)  - KERRY-ANNE FAMIGLIETTI has currently uncontrolled symptomatic type 2 DM since  67 years of age.  She presents with her CGM, showing significantly above target glycemic profile.  Her recent 2-week average is 209, although improving from 293 during her last visit.  Her CGM AGP shows 33% time in range, 45% level 1 hyperglycemia, 22% level 2 hyperglycemia.  Her point-of-care A1c is 10.6%, improving from 11.4% during her last visit.    Recent labs reviewed.  - I had a long discussion with her about the progressive nature of diabetes and the pathology behind its complications. -her diabetes is complicated by coronary artery disease, peripheral neuropathy, obesity, sedentary life, nonalcoholic fatty liver disease and she remains at extremely  high risk for more acute and chronic complications which include CAD, CVA, CKD, retinopathy, and neuropathy. These are all discussed in detail with her.  - I discussed all available options of managing her diabetes including de-escalation of medications.  Considering her associated comorbidities including sleep apnea requiring CPAP, nonalcoholic fatty liver disease, hypertension, hyperlipidemia, she is a perfect candidate for lifestyle medicine.    I have counseled her on diet  and weight management  by adopting a Whole Food , Plant Predominant  ( WFPP) nutrition as recommended by [employer] of Lifestyle Medicine. Patient is encouraged to switch to  unprocessed or minimally processed  complex starch, adequate protein intake (mainly plant source), minimal liquid fat ( mainly vegetable oils), plenty of fruits, and vegetables. -  she is advised to stick to a routine mealtimes to  eat 3 complete meals a day and snack only when necessary ( to snack only to correct hypoglycemia BG <70 day time or <100 at night).   -Her engagement with lifestyle medicine nutrition is suboptimal, however,  she acknowledges that there is a room for improvement in her food and drink choices.  - she acknowledges that there is a room for improvement in her food and drink choices. - Suggestion is made for her to avoid simple carbohydrates  from her diet including Cakes, Sweet Desserts, Ice Cream, Soda (diet and regular), Sweet Tea, Candies, Chips, Cookies, Store Bought Juices, Alcohol , Artificial Sweeteners,  Coffee Creamer, and "Sugar-free" Products, Lemonade. This will help patient to have more stable blood glucose profile and potentially avoid unintended weight gain.  The following Lifestyle Medicine recommendations according to American College of Lifestyle Medicine  [facility]) were discussed and and offered to patient and she  agrees to start the journey:  A. Whole Foods, Plant-Based Nutrition comprising of fruits and vegetables, plant-based proteins, whole-grain carbohydrates was discussed in detail  with the patient.   A list for source of those nutrients were also provided to the patient.  Patient will use only water or unsweetened tea for hydration. B.  The need to stay away from risky substances including alcohol, smoking; obtaining 7 to 9 hours of restorative sleep, at least 150 minutes of moderate intensity exercise weekly, the importance of healthy social connections,  and stress management techniques were discussed. C.  A full color page of  Calorie density of various food groups per pound showing examples of each food groups was provided to the patient.    - she has been scheduled with Norm Salt, RDN, CDE for individualized diabetes education.  - I have approached her with the following plan to manage  her diabetes and patient agrees:   -She is accompanied by her husband to clinic  today.  I had a long discussion with the family about her uncontrolled diabetes posing a very serious risk for acute and chronic complications of diabetes including cardiovascular disease.   -Insulin injection techniques were advised with her.  She is advised to rotate injection sites.    - She is advised to increase her Tresiba to 110 units nightly, continue NovoLog  14-20 units 3 times daily is for Premeal blood glucose readings above 90 mg per DL.  She is advised to continue metformin 500 mg p.o. daily. She would have benefited from either SGLT2 inhibitor or GLP-1 receptor agonist. -She did not afford Jardiance unfortunately.  She did not afford the co-pays of the GLP-1 receptor agonist either.  She is back on glipizide 5 mg p.o. daily at breakfast.   - Specific targets for  A1c;  LDL, HDL,  and Triglycerides were discussed with the patient.  2) Blood Pressure /Hypertension:   -Her blood pressure is controlled to target.  she is advised to continue her current medications including Imdur 60 mg p.o. daily with breakfast .  3) Lipids/Hyperlipidemia:    Her most recent lipid panel showed improved LDL to 91 from 120.    She is advised to continue on her current triple agent therapy including fenofibrate 145 mg p.o. daily, 35 acids 1000 mg p.o. twice daily, Crestor 10 mg p.o. daily at bedtime.   She has a chance to simplify this treatment with proper engagement in lifestyle medicine.  She is hesitant to engage with lifestyle medicine for now.  Side effects and precautions discussed with her.  4)  Weight/Diet:   Her Body mass index is 38.28 kg/[place].  -   clearly complicating her diabetes care.   she is  a candidate for weight loss. I discussed with her the fact that loss of 5 - 10% of her  current body weight will have the most impact on her diabetes management.  The above detailed  AC[facility] recommendations for nutrition, exercise, sleep, social life, avoidance of risky substances, the need for restorative  sleep   information will also detailed on discharge instructions.  5) Hypothyroidism:  The circumstances of her diagnosis are not available to review.  Her recent labs were consistent with appropriate replacement.  She is advised to continue levothyroxine 50 mcg p.o. daily before breakfast.    - We discussed about the correct intake of her thyroid hormone, on empty stomach at fasting, with water, separated by at least 30 minutes from breakfast and other medications,  and separated by more than 4 hours from calcium, iron, multivitamins, acid reflux medications (PPIs). -Patient is made aware of the fact that  thyroid hormone replacement is needed for life, dose to be adjusted by periodic monitoring of thyroid function tests.    6) Chronic Care/Health Maintenance: -she is on Statin medications and  is encouraged to initiate and continue to follow up with Ophthalmology, Dentist,  Podiatrist at least yearly or according to recommendations, and advised to   stay away from smoking. I have recommended yearly flu vaccine and pneumonia vaccine at least every 5 years; moderate intensity exercise for up to 150 minutes weekly; and  sleep for 7- 9 hours a day.  -She will need to have vitamin D supplement with vitamin D3 5000 units daily x90 days.  - she is advised to maintain close follow up with Rachel Shivers, PA-C for primary care needs, as well as her other providers for optimal and coordinated care.   I spent  42  minutes in the care of the patient today including review of labs from CMP, Lipids, Thyroid Function, Hematology (current and previous including abstractions from other facilities); face-to-face time discussing  her blood glucose readings/logs, discussing hypoglycemia and hyperglycemia episodes and symptoms, medications doses, her options of short and long term treatment based on the latest standards of care / guidelines;  discussion about incorporating lifestyle medicine;  and documenting  the encounter. Risk reduction counseling performed per USPSTF guidelines to reduce  obesity and cardiovascular risk factors.     Please refer to Patient Instructions for Blood Glucose Monitoring and Insulin/Medications Dosing Guide"  in media tab for additional information. Please  also refer to " Patient Self Inventory" in the Media  tab for reviewed elements of pertinent patient history.  Elnita Ricotta Croslin participated in the discussions, expressed understanding, and voiced agreement with the above plans.  All questions were answered to her satisfaction. she is encouraged to contact clinic should she have any questions or concerns prior to her return visit.     Follow up plan: - Return in about 4 months (around 11/11/2023) for Bring Meter/CGM Device/Logs- A1c in Office.  Ronny Bacon, [facility] [address] [place], [place] [address] Phone: [phone] Fax: [phone]  07/14/2023, 10:58 AM

## 2023-07-19 ENCOUNTER — Other Ambulatory Visit: Payer: Self-pay | Admitting: Nurse Practitioner

## 2023-07-19 ENCOUNTER — Encounter (HOSPITAL_COMMUNITY): Payer: Medicare HMO

## 2023-07-19 ENCOUNTER — Other Ambulatory Visit: Payer: Self-pay

## 2023-07-19 DIAGNOSIS — Z79899 Other long term (current) drug therapy: Secondary | ICD-10-CM

## 2023-07-19 DIAGNOSIS — R790 Abnormal level of blood mineral: Secondary | ICD-10-CM

## 2023-07-19 MED ORDER — MAGNESIUM OXIDE 400 240 MG PO PACK: 400.0000 mg | PACK | Freq: Every day | ORAL | Status: AC

## 2023-07-20 DIAGNOSIS — G4710 Hypersomnia, unspecified: Secondary | ICD-10-CM | POA: Diagnosis not present

## 2023-07-20 DIAGNOSIS — K219 Gastro-esophageal reflux disease without esophagitis: Secondary | ICD-10-CM | POA: Diagnosis not present

## 2023-07-20 DIAGNOSIS — E1165 Type 2 diabetes mellitus with hyperglycemia: Secondary | ICD-10-CM | POA: Diagnosis not present

## 2023-07-20 DIAGNOSIS — G4733 Obstructive sleep apnea (adult) (pediatric): Secondary | ICD-10-CM | POA: Diagnosis not present

## 2023-07-20 DIAGNOSIS — F0390 Unspecified dementia without behavioral disturbance: Secondary | ICD-10-CM | POA: Diagnosis not present

## 2023-07-20 DIAGNOSIS — F339 Major depressive disorder, recurrent, unspecified: Secondary | ICD-10-CM | POA: Diagnosis not present

## 2023-07-20 DIAGNOSIS — F325 Major depressive disorder, single episode, in full remission: Secondary | ICD-10-CM | POA: Diagnosis not present

## 2023-07-20 DIAGNOSIS — E039 Hypothyroidism, unspecified: Secondary | ICD-10-CM | POA: Diagnosis not present

## 2023-07-20 DIAGNOSIS — E785 Hyperlipidemia, unspecified: Secondary | ICD-10-CM | POA: Diagnosis not present

## 2023-07-21 ENCOUNTER — Encounter (HOSPITAL_COMMUNITY): Payer: Medicare HMO

## 2023-07-23 ENCOUNTER — Encounter (HOSPITAL_COMMUNITY): Payer: Medicare HMO

## 2023-07-26 ENCOUNTER — Encounter (HOSPITAL_COMMUNITY): Payer: Medicare HMO

## 2023-07-26 DIAGNOSIS — S233XXA Sprain of ligaments of thoracic spine, initial encounter: Secondary | ICD-10-CM | POA: Diagnosis not present

## 2023-07-26 DIAGNOSIS — M9902 Segmental and somatic dysfunction of thoracic region: Secondary | ICD-10-CM | POA: Diagnosis not present

## 2023-07-26 DIAGNOSIS — S338XXA Sprain of other parts of lumbar spine and pelvis, initial encounter: Secondary | ICD-10-CM | POA: Diagnosis not present

## 2023-07-26 DIAGNOSIS — S134XXA Sprain of ligaments of cervical spine, initial encounter: Secondary | ICD-10-CM | POA: Diagnosis not present

## 2023-07-26 DIAGNOSIS — M9901 Segmental and somatic dysfunction of cervical region: Secondary | ICD-10-CM | POA: Diagnosis not present

## 2023-07-26 DIAGNOSIS — M9903 Segmental and somatic dysfunction of lumbar region: Secondary | ICD-10-CM | POA: Diagnosis not present

## 2023-07-27 ENCOUNTER — Encounter (HOSPITAL_COMMUNITY): Payer: Medicare HMO | Attending: Cardiology | Admitting: *Deleted

## 2023-07-27 DIAGNOSIS — I4821 Permanent atrial fibrillation: Secondary | ICD-10-CM | POA: Insufficient documentation

## 2023-07-27 DIAGNOSIS — Z5181 Encounter for therapeutic drug level monitoring: Secondary | ICD-10-CM | POA: Insufficient documentation

## 2023-07-27 LAB — POCT INR: INR: 2.6 (ref 2.0–3.0)

## 2023-07-27 NOTE — Patient Instructions (Signed)
Continue warfarin 1/2 tablet daily except 1 tablet on Sundays, Tuesdays and Thursdays  Recheck INR in 6 weeks.

## 2023-07-28 ENCOUNTER — Encounter (HOSPITAL_COMMUNITY): Payer: Medicare HMO

## 2023-07-29 DIAGNOSIS — N390 Urinary tract infection, site not specified: Secondary | ICD-10-CM | POA: Insufficient documentation

## 2023-07-29 DIAGNOSIS — Z960 Presence of urogenital implants: Secondary | ICD-10-CM | POA: Insufficient documentation

## 2023-07-29 DIAGNOSIS — N3281 Overactive bladder: Secondary | ICD-10-CM | POA: Insufficient documentation

## 2023-07-29 NOTE — Progress Notes (Signed)
Name: Rachel Odonnell DOB: Mar 08, 1956 MRN: [id number]  History of Present Illness: Rachel Odonnell is a 67 y.o. female who presents today for follow up visit at [facility] [place]. - GU History: 1. Kidney stones. - She has had ESWL twice, 2 ureteroscopies, and she has passed 3 other calculi.  - 09/22/2022: 24 hour urine shows good urine volume, high calcium, high uric acid.  - Taking Indapamide 2.5mg  daily for stone prevention as per Dr. Ronne Binning. 2. Stress urinary incontinence (resolved).  - 02/22/2012: Mid-urethral sling implanted by Dr. Myles Lipps. 3. OAB with urinary frequency, urgency, and urge incontinence.  4. Recurrent UTIs.   Urine culture results in past 12 months: - 08/26/2022: Positive for E. coli - 11/04/2022: Positive for E. coli - 11/10/2022: Negative  - 04/13/2023: Positive for E. coli   At last visit on 04/13/2023: - Reports significant improvement in OAB symptoms with Rachel Odonnell, however unable to continue Gemtesa due to cost after running out of samples. - Agreed to trial Myrbetriq 25 mg as alternative. Gemtesa samples provided for use while awaiting that prescription. We discussed the small risk for BP elevation with Myrbetriq and she was encouraged to discuss use of that medication with her cardiologist prior to starting. - Symptomatic for UTI. - Urine culture positive for E. coli; treated with Macrobid.  Today: - Renal US not done prior to today's visit. - KUB today: Awaiting radiology read; no stones appreciated per provider interpretation.  She denies recent stone passage. She denies flank pain or abdominal pain. She denies fevers, nausea, or vomiting.  She reports 0 UTIs since last visit.   She reports that Myrbetriq was helpful like the Rachel Odonnell had been however both turned out to be cost-prohibitive. She ran out of Myrbetriq about 1 month ago and since then her OAB symptoms have been essentially back to baseline.   She denies possible UTI today.  Reports dysuria over the past week or so. Denies gross hematuria, straining to void, or sensations of incomplete emptying.  She reports some vaginal dryness and itching; denies vaginal pain, bleeding, discharge. She reports that in the past she used topical vaginal estrogen cream but had a hard time remembering to do that.    Fall Screening: Do you usually have a device to assist in your mobility? No   Medications: Current Outpatient Medications  Medication Sig Dispense Refill   estradiol (ESTRACE) 0.1 MG/GM vaginal cream Discard plastic applicator. Insert a blueberry size amount (approximately 1 gram) of cream on fingertip inside vagina at bedtime every night for 1 week then every other night. For long term use. 30 g 3   Vibegron (GEMTESA) 75 MG TABS Take 1 tablet (75 mg total) by mouth daily.     Accu-Chek Softclix Lancets lancets Use as instructed to test blood glucose four times daily 100 each 2   busPIRone (BUSPAR) 5 MG tablet Take 5 mg by mouth at bedtime.     Cholecalciferol (VITAMIN D3) 125 MCG (5000 UT) CAPS Take 1 capsule (5,000 Units total) by mouth daily. 90 capsule 0   citalopram (CELEXA) 20 MG tablet Take 1 tablet (20 mg total) by mouth every evening. 30 tablet 4   clopidogrel (PLAVIX) 75 MG tablet Take 1 tablet (75 mg total) by mouth daily. 90 tablet 3   Continuous Blood Gluc Sensor (FREESTYLE LIBRE 2 SENSOR) MISC Change sensor every 14 days. Use to check glucose continuously 2 each 0   Continuous Glucose Receiver (FREESTYLE LIBRE 2 READER) DEVI USE TO  CHECK GLUCOSE AS DIRECTED 1 each 0   donepezil (ARICEPT) 5 MG tablet Take 5 mg by mouth every evening.     fenofibrate 160 MG tablet Take 1 tablet (160 mg total) by mouth daily. 90 tablet 1   gabapentin (NEURONTIN) 100 MG capsule Take 1 capsule (100 mg total) by mouth at bedtime.     glipiZIDE (GLUCOTROL) 5 MG tablet Take 5 mg by mouth daily.     glucose blood test strip 1 each by Other route as needed. Use as instructed to test  blood glucose four times daily 100 each 2   indapamide (LOZOL) 2.5 MG tablet Take 1 tablet (2.5 mg total) by mouth daily. 30 tablet 11   insulin aspart (NOVOLOG FLEXPEN) 100 UNIT/ML FlexPen Inject 14-20 Units into the skin 3 (three) times daily with meals. 45 mL 3   insulin degludec (TRESIBA FLEXTOUCH) 200 UNIT/ML FlexTouch Pen Inject 110 Units into the skin at bedtime. 45 mL 3   Insulin Pen Needle (B-D ULTRAFINE III SHORT PEN) 31G X 8 MM MISC 1 each by Does not apply route as directed. 100 each 2   isosorbide mononitrate (IMDUR) 60 MG 24 hr tablet Take 1.5 tablets (90 mg total) by mouth daily. Can take an additional half tab as needed for chest pain 135 tablet 1   levocetirizine (XYZAL) 5 MG tablet Take 5 mg by mouth every evening.     levothyroxine (SYNTHROID) 50 MCG tablet TAKE 1 TABLET (50 MCG TOTAL) BY MOUTH DAILY BEFORE BREAKFAST. 90 tablet 3   Magnesium Oxide (MAGNESIUM OXIDE 400) 240 MG PACK Take 400 mg by mouth daily.     metFORMIN (GLUCOPHAGE) 500 MG tablet Take 1 tablet (500 mg total) by mouth 2 (two) times daily with a meal. 180 tablet 1   modafinil (PROVIGIL) 100 MG tablet TAKE 1 TABLET EVERY DAY 90 tablet 1   montelukast (SINGULAIR) 10 MG tablet Take 10 mg by mouth at bedtime.     Multiple Vitamin (MULTIVITAMIN ADULT PO) Take 1 tablet by mouth daily.     nitroGLYCERIN (NITROSTAT) 0.4 MG SL tablet Place 1 tablet (0.4 mg total) under the tongue every 5 (five) minutes as needed for chest pain. 25 tablet 3   OVER THE COUNTER MEDICATION Take 2 capsules by mouth daily. Omega XL     pantoprazole (PROTONIX) 40 MG tablet Take 2 tablets (80 mg total) by mouth daily. 60 tablet 4   rOPINIRole (REQUIP) 2 MG tablet Take 2 mg by mouth 2 (two) times daily.     rosuvastatin (CRESTOR) 40 MG tablet Take 1 tablet (40 mg total) by mouth daily. 30 tablet 5   warfarin (COUMADIN) 5 MG tablet TAKE 1/2 TO 1 TABLET BY MOUTH DAILY AS DIRECTED BY THE COUMADIN CLINIC. 65 tablet 1   No current  facility-administered medications for this visit.    Allergies: Allergies  Allergen Reactions   Azithromycin Hives   Codeine Nausea Only   Atomoxetine Nausea And Vomiting   Atorvastatin Other (See Comments)    Restless legs   Lisinopril Nausea And Vomiting    Past Medical History:  Diagnosis Date   ADD (attention deficit disorder)    Diabetes mellitus without complication (HCC)    Hypertension    Past Surgical History:  Procedure Laterality Date   ABDOMINAL HYSTERECTOMY     COLONOSCOPY  02/2022   CORONARY PRESSURE/FFR STUDY N/A 04/16/2023   Procedure: CORONARY PRESSURE/FFR STUDY;  Surgeon: Swaziland, Peter M, MD;  Location: [facility] INVASIVE CV LAB;  Service: Cardiovascular;  Laterality: N/A;   CORONARY STENT INTERVENTION N/A 04/16/2023   Procedure: CORONARY STENT INTERVENTION;  Surgeon: Swaziland, Peter M, MD;  Location: [facility] INVASIVE CV LAB;  Service: Cardiovascular;  Laterality: N/A;   CORONARY ULTRASOUND/IVUS N/A 04/16/2023   Procedure: Coronary Ultrasound/IVUS;  Surgeon: Swaziland, Peter M, MD;  Location: [facility] INVASIVE CV LAB;  Service: Cardiovascular;  Laterality: N/A;   EXCISIONAL HEMORRHOIDECTOMY     INCONTINENCE SURGERY     RIGHT/LEFT HEART CATH AND CORONARY ANGIOGRAPHY N/A 06/13/2021   Procedure: RIGHT/LEFT HEART CATH AND CORONARY ANGIOGRAPHY;  Surgeon: Swaziland, Peter M, MD;  [facility];  Service: Cardiovascular;  Laterality: N/A;   RIGHT/LEFT HEART CATH AND CORONARY ANGIOGRAPHY N/A 04/16/2023   Procedure: RIGHT/LEFT HEART CATH AND CORONARY ANGIOGRAPHY;  Surgeon: Swaziland, Peter M, MD;  Location: [facility] INVASIVE CV LAB;  Service: Cardiovascular;  Laterality: N/A;   Family History  Problem Relation Age of Onset   Cancer Mother    Diabetes Father    Heart Problems Father    COPD Father    Emphysema Father    Heart block Father    Congestive Heart Failure Father    Social History   Socioeconomic History   Marital status: Married    Spouse name: Shon Hale   Number of  children: Not on file   Years of education: Not on file   Highest education level: Not on file  Occupational History   Not on file  Tobacco Use   Smoking status: Former    Current packs/day: 0.00    Types: Cigarettes    Quit date: 08/17/1994    Years since quitting: 28.9   Smokeless tobacco: Never  Vaping Use   Vaping status: Never Used  Substance and Sexual Activity   Alcohol use: No   Drug use: No   Sexual activity: Yes  Other Topics Concern   Not on file  Social History Narrative   Lives at home with husband, Shon Hale   Former smoker Smoked 2 ppd x 25 yrs; Quit in 1996   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, retail  retired Secondary school teacher, still works part-time at the [employer], used to work Editor, commissioning   Now on fixed income from her SSI and pension   Social Drivers of Corporate investment banker Strain: Low Risk  (10/10/2021)   Overall Financial Resource Strain (CARDIA)    Difficulty of Paying Living Expenses: Not hard at all  Food Insecurity: No Food Insecurity (04/16/2023)   Hunger Vital Sign    Worried About Programme researcher, broadcasting/film/video in the Last Year: Never true    Ran Out of Food in the Last Year: Never true  Transportation Needs: No Transportation Needs (04/16/2023)   PRAPARE - Administrator, Civil Service (Medical): No    Lack of Transportation (Non-Medical): No  Physical Activity: Inactive (04/08/2023)   [facility]   Exercise Vital Sign    Days of Exercise per Week: 0 days    Minutes of Exercise per Session: 0 min  Stress: No Stress Concern Present (10/10/2021)   [employer] of Occupational Health - Occupational Stress Questionnaire    Feeling of Stress : Only a little  Social Connections: Socially Integrated (10/10/2021)   Social Connection and Isolation Panel [NHANES]    Frequency of Communication with Friends and Family: More than three times a week    Frequency of Social Gatherings with Friends and  Family:  More than three times a week    Attends Religious Services: More than 4 times per year    Active Member of Clubs or Organizations: Yes    Attends Banker Meetings: More than 4 times per year    Marital Status: Married  Catering manager Violence: Not At Risk (04/16/2023)   Humiliation, Afraid, Rape, and Kick questionnaire    Fear of Current or Ex-Partner: No    Emotionally Abused: No    Physically Abused: No    Sexually Abused: No    SUBJECTIVE  Review of Systems Constitutional: Patient denies any unintentional weight loss or change in strength lntegumentary: Patient denies any rashes or pruritus ENT: Reports chronic dry mouth and dry eyes. Cardiovascular: Patient denies chest pain or syncope Respiratory: Patient denies shortness of breath Gastrointestinal: Patient denies nausea, vomiting, or diarrhea. Reports chronic constipation; takes Miralax daily. Musculoskeletal: Patient denies muscle cramps or weakness Neurologic: Patient denies convulsions or seizures Allergic/Immunologic: Patient denies recent allergic reaction(s) Hematologic/Lymphatic: Patient denies bleeding tendencies Endocrine: Patient denies heat/cold intolerance  GU: As per HPI.  OBJECTIVE There were no vitals filed for this visit. There is no height or weight on file to calculate BMI.  Physical Examination Constitutional: No obvious distress; patient is non-toxic appearing  Cardiovascular: No visible lower extremity edema.  Respiratory: The patient does not have audible wheezing/stridor; respirations do not appear labored  Gastrointestinal: Abdomen non-distended Musculoskeletal: Normal ROM of UEs  Skin: No obvious rashes/open sores  Neurologic: CN 2-12 grossly intact Psychiatric: Answered questions appropriately with normal affect  Hematologic/Lymphatic/Immunologic: No obvious bruises or sites of spontaneous bleeding  Urine microscopy: 6-10 WBC/hpf, >30 RBC/hpf, moderate bacteria PVR: 0  ml  ASSESSMENT Kidney stones - Plan: Urinalysis, Routine w reflex microscopic, BLADDER SCAN AMB NON-IMAGING, DG Abd 1 View  Recurrent UTI - Plan: Urinalysis, Routine w reflex microscopic, BLADDER SCAN AMB NON-IMAGING, Urine Culture, estradiol (ESTRACE) 0.1 MG/GM vaginal cream  OAB (overactive bladder) - Plan: Urinalysis, Routine w reflex microscopic, BLADDER SCAN AMB NON-IMAGING, Vibegron (GEMTESA) 75 MG TABS  Urge incontinence - Plan: Urinalysis, Routine w reflex microscopic, BLADDER SCAN AMB NON-IMAGING, Vibegron (GEMTESA) 75 MG TABS  Presence urogenital implant - Mid-urethral sling implanted by Dr. Myles Lipps on 02/22/2012 for stress urinary incontinence. - Plan: Urinalysis, Routine w reflex microscopic, BLADDER SCAN AMB NON-IMAGING  Presence of urogenital implant  Dry mouth and eyes  Constipation, unspecified constipation type  Dysuria - Plan: Urine Culture  Atrophic vaginitis - Plan: estradiol (ESTRACE) 0.1 MG/GM vaginal cream  Dyspareunia in female - Plan: estradiol (ESTRACE) 0.1 MG/GM vaginal cream  Vaginal dryness - Plan: estradiol (ESTRACE) 0.1 MG/GM vaginal cream  1. Kidney stones. - We reviewed recent imaging results; awaiting radiology results, appears to have no acute findings per provider interpretation. - Advised to maintain adequate daily fluid intake for stone prevention and to continue Indapamide 2.5mg  daily as per Dr. Ronne Binning.  2. Recurrent UTIs. Abnormal UA today. Will check urine culture and treat as indicated based on results.  Advised the following for UTI prevention: > Maintain adequate fluid intake daily to flush out the urinary tract. > Restart topical vaginal estrogen for symptomatic vaginal atrophy.  - The etiology and consequences of urogenital epithelial atrophy was explained to patient.  - The normal bacterial flora that colonizes the perineum may contribute to UTI risk because the thin urethral epithelium allows the bacteria to become adherent and  the change in vaginal pH can disrupt the vaginal / urethral microbiome and allow for bacterial overgrowth.  -  Patient was advised that topical vaginal estrogen replacement will take about 3 months to restore the vaginal pH and may sting/burn initially due to severe dryness, which will improve with ongoing treatment.   If recurrent UTls persist, may consider cystoscopy for further evaluation due to presence of MUS to rule out mesh exposure. Could also consider future use of a daily low dose antibiotic for UTI prophylaxis.   For now she elected to restart topical vaginal estrogen cream; advised nightly use to increase likelihood of compliance.   3. Symptomatic vaginal atrophy with vaginal dryness / itching / dyspareunia. As above; restarting topical vaginal estrogen cream use.  4. OAB with urinary frequency, urgency, and urge incontinence.  - Discussed alternative / less expensive OAB medication options (anticholinergic drug class) but agreed to avoid those due to age, pre-existing dry eyes/ dry mouth / constipation which could get worse with an anticholinergic medication, and additional side effects risks of cognitive impairment / dementia in the setting of pre-existing memory impairment. - Agreed to restart Gemtesa 75 mg daily via office samples.  - May consider 3rd line OAB therapies in the future if needed.   Will plan to follow up in 6 months with KUB for stone surveillance or sooner if needed. Pt verbalized understanding and agreement. All questions were answered.  PLAN Advised the following: 1. Urine culture. 2. Topical vaginal estrogen cream nightly. 4. Gemtesa 75 mg daily. 3. Maintain adequate fluid intake daily. 5. Return in about 6 months (around 02/01/2024) for KUB, UA, PVR, & f/u with Evette Georges NP.  Orders Placed This Encounter  Procedures   Urine Culture   DG Abd 1 View    Standing Status:   Future    Expected Date:   02/01/2024    Expiration Date:   08/02/2024    Reason  for Exam (SYMPTOM  OR DIAGNOSIS REQUIRED):   kidney stone    Preferred imaging location?:   [facility]   Urinalysis, Routine w reflex microscopic   BLADDER SCAN AMB NON-IMAGING    It has been explained that the patient is to follow regularly with their PCP in addition to all other providers involved in their care and to follow instructions provided by these respective offices. Patient advised to contact urology clinic if any urologic-pertaining questions, concerns, new symptoms or problems arise in the interim period.  Patient Instructions  Recommendations regarding UTI prevention / management:  Options when UTI symptoms occur: 1. Call [facility] [place] to request urgent / same-day visit (phone # [phone]).  2. Call your Primary Care Provider (PCP) office to request urgent / same-day visit. Be sure to request for urine culture to be ordered and have results faxed to Urology (fax # [phone]).  3. Go to urgent care. Be sure to request for urine culture to be ordered and have results faxed to Urology (fax # [phone]).   For bladder pain/ burning with urination: - Can take OTC Pyridium (phenazopyridine; commonly known under the "AZO" brand) for a few days as needed. Limit use to no more than 3 days consecutively due to risk for methemoglobinemia, liver function issues, and bone health damage with long term use of Pyridium.  Routine use for UTI prevention: - Topical vaginal estrogen for vaginal atrophy. - Adequate daily fluid intake to flush out the urinary tract. - Go to the bathroom to urinate every 4-6 hours while awake to minimize urinary stasis / bacterial overgrowth in the bladder. - Proanthocyanidin (PAC) supplement 36 mg daily;  must be soluble (insoluble form of PAC will be ineffective). Recommended brand: Ellura. This is an over-the-counter supplement (often must be found/ purchased online) supplement derived from cranberries with concentrated active  component: Proanthocyanidin (PAC) 36 mg daily. Decreases bacterial adherence to bladder lining.  - D-mannose powder (2 grams daily). This is an over-the-counter supplement which decreases bacterial adherence to bladder lining (it is a sugar that inhibits bacterial adherence to urothelial cells by binding to the pili of enteric bacteria). Take as per manufacturer recommendation. Can be used as an alternative or in addition to the concentrated cranberry supplement.  - Vitamin C supplement to acidify urine to minimize bacterial growth.  - Probiotic to maintain healthy vaginal microbiome to suppress bacteria at urethral opening. Brand recommendations: Darrold Junker (includes probiotic & D-mannose ), Feminine Balance (highest concentration of lactobacillus) or Hyperbiotic Pro 15.  Note for patients with diabetes:  - Be aware that D-mannose contains sugar.  Note for patients with interstitial cystitis (IC):  - Patients with IC should typically avoid cranberry/ PAC supplements and Vitamin C supplements due to their acidity, which may exacerbate IC-related bladder pain. - Symptoms of true bacterial UTI can overlap / mimic symptoms of an IC flare up. Antibiotic use is NOT indicated for IC flare ups. Urine culture needed prior to antibiotic treatment for IC patients. The goal is to minimize your risk for developing antibiotic-resistant bacteria.    Vulvovaginal atrophy I Genitourinary syndrome of menopause (GSM):  What it is: Changes in the vaginal environment (including the vulva and urethra) including: Thinning of the epithelium (skin/ mucosa surface) Can contribute to urinary urgency and frequency Can contribute to dryness, itching, irritation of the vulvar and vaginal tissue Can contribute to pain with intercourse Can contribute to physical changes of the labia, vulva, and vagina such as: Narrowing of the vaginal opening Decreased vaginal length Loss of labial architecture Labial adhesions Pale color of  vulvovaginal tissue  Loss of pubic hair Allows bacteria to become adherent  Results in increased risk for urinary tract infection (UTI) due to bacterial overgrowth and migration up the urethra into the bladder Change in vaginal pH (acid/ base balance) Allows for alteration / disruption of the normal bacterial flora / microbiome Results in increased risk for urinary tract infection (UTI) due to bacterial overgrowth  Treatment options: Over-the-counter lubricants (see list below). Prescription vaginal estrogen replacement. Options: Topical vaginal estrogen cream Estrace, Premarin, or compounded estradiol cream/ gel We advise: Discard plastic applicator as that tends to use more medication than you need, which is not harmful but wastes / uses up the medication. Also the plastic applicator may cause discomfort. Insert blueberry size amount of medication via the tip of your finger inside vagina nightly for 1 week then 2-3 times per week (long term). Estring vaginal ring Exchanged every 3 months (either at home or in office by provider) Vagifem vaginal tablet Inserted nightly for 2 weeks then twice a week (long term) lntrarosa vaginal suppository Vaginal DHEA: converts to estrogen in vaginal tissue without systemic effect Inserted nightly (long term) Vaginal laser therapy (Mona Lisa touch) Performed in 3 treatments each 6 weeks apart (available in our [place] office). Can feel like a sunburn for 3-4 days after each treatment until new skin heals in. Usually not covered by insurance. Estimated cost is $1500 for all 3 sessions.  FYI regarding prescription vaginal estrogen treatment options: All topical vaginal estrogen replacement options are equivalent in terms of efficacy. Topical vaginal estrogen replacement will take about 3 months to  be effective. OK to have sex with any of the topical vaginal estrogen replacement options. Topical vaginal estrogen replacement may sting/burn initially  due to severe dryness, which will improve with ongoing treatment. There have been studies that evaluate use of low-dose intravaginal estrogen that show minimal systemic absorption which is negligible after 3 weeks. There have been no studies indicating increased risk of contributing to cancer development or recurrence.  Topical vaginal estrogen cream safe to use with breast cancer history [email or web address]  Topical vaginal estrogen cream safe to use with blood clot history [email or web address]   Lubricants and Moisturizers for Treating Genitourinary Syndrome of Menopause and Vulvovaginal Atrophy Treatment Comments I Available Products   Lubricants   Water-based Ingredients: Deionized water, glycerin, propylene glycol; latex safe; rare irritation; dry out with extended sexual activity Astroglide, Good Clean Love, K-Y Jelly, Natural, Organic, Pink, Sliquid, Sylk, Yes    Oil Based Ingredients: avocado, olive, peanut, corn; latex safe; can be used with silicone products; staining; safe (unless peanut allergy); non-irritating Coconut oil, vegetable oil, vitamin E oil  Silicone-Based Ingredients: Silicone polymers; staining; typically nonirritating, long lasting; waterproof; should not be used with silicone dilators, sexual toys, or gynecologic products Astroglide X, Oceanus Ultra Pure, Pink Silicone, Pjur Eros, Replens Silky Smooth, Silicone Premium JO, SKYN, Uberlube, [employer] Based Minimize harm to sperm motility; designed Astroglide TTC, Conceive Plus, Pre for couples trying to conceive Seed, Yes Baby  Fertility Friendly Minimize harm to sperm motility; designed Astroglide, TTC, Conceive Plus, Pre for couples trying to conceive Seed, Yes  Baby  Vaginal Moisturizers   Vaginal Moisturizers For maintenance use 1 to 3 times weekly; can benefit women with dryness, chafing with AOL, and recurrent vaginal infections irrespective of sexual activity timing Balance Active Menopause Vaginal Moisturizing Lubricant, Canesintima Intimate Moisturizer, Replens, Rephresh, Sylk Natural Intimate Moisturizer, Yes Vaginal Moisturizer  Hybrids Properties of both water and silicone-based products (combination of a vaginal lubricant and moisturizer); Non-irritating; good option for women with allergies and sensitivities Lubrigyn, Luvena  Suppositories Hyaluronic acid to retain moisture Revaree  Vulvar Soothing Creams/Oils    Medicated CreamsP ain and burn relief; Ingredients: 4% Lidocaine, Aloe Vera gel Releveum ([place] Joseph)  Non-Medicated Creams For anti-itch and moisture/maintenance; Ingredients: Coconut oil, Avocado oil, Shea Butter, Olive oil, Vitamin E Vajuvenate, Vmagic  Oils !For moisture/maintenance !Coconut oil, Vitamin E oil, Emu oil     Electronically signed by:  Donnita Falls, MSN, FNP-C, CUNP 08/03/2023 4:49 PM

## 2023-07-30 ENCOUNTER — Encounter (HOSPITAL_COMMUNITY): Payer: Medicare HMO

## 2023-07-30 DIAGNOSIS — R790 Abnormal level of blood mineral: Secondary | ICD-10-CM | POA: Diagnosis not present

## 2023-07-30 DIAGNOSIS — Z79899 Other long term (current) drug therapy: Secondary | ICD-10-CM | POA: Diagnosis not present

## 2023-07-31 LAB — MAGNESIUM: Magnesium: 2 mg/dL (ref 1.6–2.3)

## 2023-08-02 ENCOUNTER — Encounter (HOSPITAL_COMMUNITY): Payer: Medicare HMO

## 2023-08-03 ENCOUNTER — Ambulatory Visit: Payer: Medicare HMO | Admitting: Urology

## 2023-08-03 ENCOUNTER — Ambulatory Visit (HOSPITAL_COMMUNITY)
Admission: RE | Admit: 2023-08-03 | Discharge: 2023-08-03 | Disposition: A | Discharge status: Home / Self Care | Payer: Medicare HMO | Source: Ambulatory Visit | Attending: Urology | Admitting: Urology

## 2023-08-03 ENCOUNTER — Encounter: Payer: Self-pay | Admitting: Urology

## 2023-08-03 DIAGNOSIS — N209 Urinary calculus, unspecified: Secondary | ICD-10-CM | POA: Diagnosis not present

## 2023-08-03 DIAGNOSIS — Z87440 Personal history of urinary (tract) infections: Secondary | ICD-10-CM | POA: Diagnosis not present

## 2023-08-03 DIAGNOSIS — N952 Postmenopausal atrophic vaginitis: Secondary | ICD-10-CM | POA: Diagnosis not present

## 2023-08-03 DIAGNOSIS — N941 Unspecified dyspareunia: Secondary | ICD-10-CM

## 2023-08-03 DIAGNOSIS — N200 Calculus of kidney: Secondary | ICD-10-CM

## 2023-08-03 DIAGNOSIS — Z87442 Personal history of urinary calculi: Secondary | ICD-10-CM | POA: Diagnosis not present

## 2023-08-03 DIAGNOSIS — R82998 Other abnormal findings in urine: Secondary | ICD-10-CM | POA: Diagnosis not present

## 2023-08-03 DIAGNOSIS — N3941 Urge incontinence: Secondary | ICD-10-CM

## 2023-08-03 DIAGNOSIS — Z960 Presence of urogenital implants: Secondary | ICD-10-CM

## 2023-08-03 DIAGNOSIS — R682 Dry mouth, unspecified: Secondary | ICD-10-CM

## 2023-08-03 DIAGNOSIS — R300 Dysuria: Secondary | ICD-10-CM | POA: Diagnosis not present

## 2023-08-03 DIAGNOSIS — N3281 Overactive bladder: Secondary | ICD-10-CM

## 2023-08-03 DIAGNOSIS — N898 Other specified noninflammatory disorders of vagina: Secondary | ICD-10-CM

## 2023-08-03 DIAGNOSIS — N390 Urinary tract infection, site not specified: Secondary | ICD-10-CM | POA: Diagnosis not present

## 2023-08-03 DIAGNOSIS — K5900 Constipation, unspecified: Secondary | ICD-10-CM

## 2023-08-03 LAB — BLADDER SCAN AMB NON-IMAGING: Scan Result: 0

## 2023-08-03 MED ORDER — GEMTESA 75 MG PO TABS: 1.0000 | ORAL_TABLET | Freq: Every day | ORAL | Status: DC

## 2023-08-03 MED ORDER — ESTRADIOL 0.1 MG/GM VA CREA: TOPICAL_CREAM | VAGINAL | 3 refills | Status: DC

## 2023-08-03 NOTE — Patient Instructions (Signed)
 Recommendations regarding UTI prevention / management:  Options when UTI symptoms occur: 1. Call [facility] to request urgent / same-day visit (phone # [phone]).  2. Call your Primary Care Provider (PCP) office to request urgent / same-day visit. Be sure to request for urine culture to be ordered and have results faxed to Urology (fax # [phone]).  3. Go to urgent care. Be sure to request for urine culture to be ordered and have results faxed to Urology (fax # [phone]).   For bladder pain/ burning with urination: - Can take OTC Pyridium (phenazopyridine; commonly known under the "AZO" brand) for a few days as needed. Limit use to no more than 3 days consecutively due to risk for methemoglobinemia, liver function issues, and bone health damage with long term use of Pyridium.  Routine use for UTI prevention: - Topical vaginal estrogen for vaginal atrophy. - Adequate daily fluid intake to flush out the urinary tract. - Go to the bathroom to urinate every 4-6 hours while awake to minimize urinary stasis / bacterial overgrowth in the bladder. - Proanthocyanidin (PAC) supplement 36 mg daily; must be soluble (insoluble form of PAC will be ineffective). Recommended brand: Ellura. This is an over-the-counter supplement (often must be found/ purchased online) supplement derived from cranberries with concentrated active component: Proanthocyanidin (PAC) 36 mg daily. Decreases bacterial adherence to bladder lining.  - D-mannose powder (2 grams daily). This is an over-the-counter supplement which decreases bacterial adherence to bladder lining (it is a sugar that inhibits bacterial adherence to urothelial cells by binding to the pili of enteric bacteria). Take as per manufacturer recommendation. Can be used as an alternative or in addition to the concentrated cranberry supplement.  - Vitamin C supplement to acidify urine to minimize bacterial growth.  - Probiotic to maintain  healthy vaginal microbiome to suppress bacteria at urethral opening. Brand recommendations: Darrold Junker (includes probiotic & D-mannose ), Feminine Balance (highest concentration of lactobacillus) or Hyperbiotic Pro 15.  Note for patients with diabetes:  - Be aware that D-mannose contains sugar.  Note for patients with interstitial cystitis (IC):  - Patients with IC should typically avoid cranberry/ PAC supplements and Vitamin C supplements due to their acidity, which may exacerbate IC-related bladder pain. - Symptoms of true bacterial UTI can overlap / mimic symptoms of an IC flare up. Antibiotic use is NOT indicated for IC flare ups. Urine culture needed prior to antibiotic treatment for IC patients. The goal is to minimize your risk for developing antibiotic-resistant bacteria.    Vulvovaginal atrophy I Genitourinary syndrome of menopause (GSM):  What it is: Changes in the vaginal environment (including the vulva and urethra) including: Thinning of the epithelium (skin/ mucosa surface) Can contribute to urinary urgency and frequency Can contribute to dryness, itching, irritation of the vulvar and vaginal tissue Can contribute to pain with intercourse Can contribute to physical changes of the labia, vulva, and vagina such as: Narrowing of the vaginal opening Decreased vaginal length Loss of labial architecture Labial adhesions Pale color of vulvovaginal tissue Loss of pubic hair Allows bacteria to become adherent  Results in increased risk for urinary tract infection (UTI) due to bacterial overgrowth and migration up the urethra into the bladder Change in vaginal pH (acid/ base balance) Allows for alteration / disruption of the normal bacterial flora / microbiome Results in increased risk for urinary tract infection (UTI) due to bacterial overgrowth  Treatment options: Over-the-counter lubricants (see list below). Prescription vaginal estrogen replacement. Options: Topical vaginal  estrogen  cream Estrace, Premarin, or compounded estradiol cream/ gel We advise: Discard plastic applicator as that tends to use more medication than you need, which is not harmful but wastes / uses up the medication. Also the plastic applicator may cause discomfort. Insert blueberry size amount of medication via the tip of your finger inside vagina nightly for 1 week then 2-3 times per week (long term). Estring vaginal ring Exchanged every 3 months (either at home or in office by provider) Vagifem vaginal tablet Inserted nightly for 2 weeks then twice a week (long term) lntrarosa vaginal suppository Vaginal DHEA: converts to estrogen in vaginal tissue without systemic effect Inserted nightly (long term) Vaginal laser therapy (Mona Lisa touch) Performed in 3 treatments each 6 weeks apart (available in our [place] office). Can feel like a sunburn for 3-4 days after each treatment until new skin heals in. Usually not covered by insurance. Estimated cost is $1500 for all 3 sessions.  FYI regarding prescription vaginal estrogen treatment options: All topical vaginal estrogen replacement options are equivalent in terms of efficacy. Topical vaginal estrogen replacement will take about 3 months to be effective. OK to have sex with any of the topical vaginal estrogen replacement options. Topical vaginal estrogen replacement may sting/burn initially due to severe dryness, which will improve with ongoing treatment. There have been studies that evaluate use of low-dose intravaginal estrogen that show minimal systemic absorption which is negligible after 3 weeks. There have been no studies indicating increased risk of contributing to cancer development or recurrence.  Topical vaginal estrogen cream safe to use with breast cancer history [email or web address]  Topical vaginal estrogen cream safe to use with blood clot  history [email or web address]   Lubricants and Moisturizers for Treating Genitourinary Syndrome of Menopause and Vulvovaginal Atrophy Treatment Comments I Available Products   Lubricants   Water-based Ingredients: Deionized water, glycerin, propylene glycol; latex safe; rare irritation; dry out with extended sexual activity Astroglide, Good Clean Love, K-Y Jelly, Natural, Organic, Pink, Sliquid, Sylk, Yes    Oil Based Ingredients: avocado, olive, peanut, corn; latex safe; can be used with silicone products; staining; safe (unless peanut allergy); non-irritating Coconut oil, vegetable oil, vitamin E oil  Silicone-Based Ingredients: Silicone polymers; staining; typically nonirritating, long lasting; waterproof; should not be used with silicone dilators, sexual toys, or gynecologic products Astroglide X, Oceanus Ultra Pure, Pink Silicone, Pjur Eros, Replens Silky Smooth, Silicone Premium JO, SKYN, Uberlube, [employer] Based Minimize harm to sperm motility; designed Astroglide TTC, Conceive Plus, Pre for couples trying to conceive Seed, Yes Baby  Fertility Friendly Minimize harm to sperm motility; designed Astroglide, TTC, Conceive Plus, Pre for couples trying to conceive Seed, Yes Baby  Vaginal Moisturizers   Vaginal Moisturizers For maintenance use 1 to 3 times weekly; can benefit women with dryness, chafing with AOL, and recurrent vaginal infections irrespective of sexual activity timing Balance Active Menopause Vaginal Moisturizing Lubricant, Canesintima Intimate Moisturizer, Replens, Rephresh, Sylk Natural Intimate Moisturizer, Yes Vaginal Moisturizer  Hybrids Properties of both water and silicone-based products (combination of a vaginal lubricant and moisturizer); Non-irritating; good option for women with allergies and  sensitivities Lubrigyn, Luvena  Suppositories Hyaluronic acid to retain moisture Revaree  Vulvar Soothing Creams/Oils    Medicated CreamsP ain and burn relief; Ingredients: 4% Lidocaine, Aloe Vera gel Releveum ([place])  Non-Medicated Creams For anti-itch and moisture/maintenance; Ingredients: Coconut oil, Avocado oil, Shea Butter, Olive oil, Vitamin E Vajuvenate, Vmagic  Oils !For moisture/maintenance !Coconut oil, Vitamin E oil, Emu oil

## 2023-08-04 ENCOUNTER — Encounter (HOSPITAL_COMMUNITY): Payer: Medicare HMO

## 2023-08-04 LAB — URINALYSIS, ROUTINE W REFLEX MICROSCOPIC
Bilirubin, UA: NEGATIVE
Glucose, UA: NEGATIVE
Ketones, UA: NEGATIVE
Nitrite, UA: NEGATIVE
Specific Gravity, UA: 1.025 (ref 1.005–1.030)
Urobilinogen, Ur: 4 mg/dL — ABNORMAL HIGH (ref 0.2–1.0)
pH, UA: 7.5 (ref 5.0–7.5)

## 2023-08-04 LAB — MICROSCOPIC EXAMINATION: RBC, Urine: >30 /[HPF] — AB (ref 0–2)

## 2023-08-05 LAB — URINE CULTURE

## 2023-08-06 ENCOUNTER — Encounter (HOSPITAL_COMMUNITY): Payer: Medicare HMO

## 2023-08-09 ENCOUNTER — Encounter (HOSPITAL_COMMUNITY): Payer: Medicare HMO

## 2023-08-13 ENCOUNTER — Other Ambulatory Visit: Payer: Self-pay | Admitting: Urology

## 2023-08-13 DIAGNOSIS — Z87442 Personal history of urinary calculi: Secondary | ICD-10-CM

## 2023-08-14 DIAGNOSIS — E1165 Type 2 diabetes mellitus with hyperglycemia: Secondary | ICD-10-CM | POA: Diagnosis not present

## 2023-08-17 DIAGNOSIS — E782 Mixed hyperlipidemia: Secondary | ICD-10-CM | POA: Diagnosis not present

## 2023-08-17 DIAGNOSIS — E1165 Type 2 diabetes mellitus with hyperglycemia: Secondary | ICD-10-CM | POA: Diagnosis not present

## 2023-08-19 ENCOUNTER — Ambulatory Visit (HOSPITAL_COMMUNITY)
Admission: RE | Admit: 2023-08-19 | Discharge: 2023-08-19 | Disposition: A | Discharge status: Home / Self Care | Payer: Medicare Other | Source: Ambulatory Visit | Attending: Urology | Admitting: Urology

## 2023-08-19 ENCOUNTER — Other Ambulatory Visit: Payer: Self-pay | Admitting: Nurse Practitioner

## 2023-08-19 DIAGNOSIS — N209 Urinary calculus, unspecified: Secondary | ICD-10-CM | POA: Insufficient documentation

## 2023-08-19 DIAGNOSIS — N200 Calculus of kidney: Secondary | ICD-10-CM | POA: Diagnosis not present

## 2023-08-24 ENCOUNTER — Telehealth: Payer: Self-pay | Admitting: Pulmonary Disease

## 2023-08-24 MED ORDER — MODAFINIL 100 MG PO TABS: 100.0000 mg | ORAL_TABLET | Freq: Every day | ORAL | 0 refills | Status: DC

## 2023-08-24 NOTE — Telephone Encounter (Signed)
 Patient had to change pharmacies due to her new insurance and she need a a new prescription sent to [facility]  [phone] for her Provigil 100 mg--she takes I daily----patient call back is [phone]

## 2023-08-24 NOTE — Addendum Note (Signed)
 Addended by: Jama Flavors on: 08/24/2023 03:12 PM   Modules accepted: Orders

## 2023-08-26 ENCOUNTER — Other Ambulatory Visit: Payer: Self-pay

## 2023-08-26 DIAGNOSIS — E1159 Type 2 diabetes mellitus with other circulatory complications: Secondary | ICD-10-CM

## 2023-08-26 DIAGNOSIS — E039 Hypothyroidism, unspecified: Secondary | ICD-10-CM

## 2023-08-26 MED ORDER — TRESIBA FLEXTOUCH 200 UNIT/ML ~~LOC~~ SOPN: 110.0000 [IU] | PEN_INJECTOR | Freq: Every day | SUBCUTANEOUS | 0 refills | Status: DC

## 2023-08-26 MED ORDER — LEVOTHYROXINE SODIUM 50 MCG PO TABS: 50.0000 ug | ORAL_TABLET | Freq: Every day | ORAL | 0 refills | Status: DC

## 2023-08-26 MED ORDER — GLIPIZIDE 5 MG PO TABS: 5.0000 mg | ORAL_TABLET | Freq: Every day | ORAL | 0 refills | Status: DC

## 2023-08-26 MED ORDER — FREESTYLE LIBRE 2 SENSOR MISC: 0 refills | Status: DC

## 2023-08-26 MED ORDER — NOVOLOG FLEXPEN 100 UNIT/ML ~~LOC~~ SOPN
14.0000 [IU] | PEN_INJECTOR | Freq: Three times a day (TID) | SUBCUTANEOUS | 0 refills | Status: DC
Start: 2023-08-26 — End: 2023-09-09

## 2023-08-26 MED ORDER — METFORMIN HCL 500 MG PO TABS: 500.0000 mg | ORAL_TABLET | Freq: Every day | ORAL | 0 refills | Status: DC

## 2023-09-06 ENCOUNTER — Encounter: Payer: Medicare Other | Attending: Cardiology | Admitting: Nurse Practitioner

## 2023-09-06 ENCOUNTER — Encounter: Payer: Self-pay | Admitting: Nurse Practitioner

## 2023-09-06 ENCOUNTER — Encounter (INDEPENDENT_AMBULATORY_CARE_PROVIDER_SITE_OTHER): Payer: Medicare Other | Admitting: *Deleted

## 2023-09-06 VITALS — BP 112/64 | HR 63 | Ht 64.0 in | Wt 228.0 lb

## 2023-09-06 DIAGNOSIS — I2510 Atherosclerotic heart disease of native coronary artery without angina pectoris: Secondary | ICD-10-CM | POA: Diagnosis not present

## 2023-09-06 DIAGNOSIS — I4891 Unspecified atrial fibrillation: Secondary | ICD-10-CM | POA: Insufficient documentation

## 2023-09-06 DIAGNOSIS — E669 Obesity, unspecified: Secondary | ICD-10-CM | POA: Insufficient documentation

## 2023-09-06 DIAGNOSIS — R5383 Other fatigue: Secondary | ICD-10-CM | POA: Insufficient documentation

## 2023-09-06 DIAGNOSIS — E781 Pure hyperglyceridemia: Secondary | ICD-10-CM | POA: Diagnosis not present

## 2023-09-06 DIAGNOSIS — Z5181 Encounter for therapeutic drug level monitoring: Secondary | ICD-10-CM | POA: Diagnosis not present

## 2023-09-06 DIAGNOSIS — I4821 Permanent atrial fibrillation: Secondary | ICD-10-CM

## 2023-09-06 DIAGNOSIS — I350 Nonrheumatic aortic (valve) stenosis: Secondary | ICD-10-CM | POA: Diagnosis not present

## 2023-09-06 DIAGNOSIS — I10 Essential (primary) hypertension: Secondary | ICD-10-CM | POA: Diagnosis not present

## 2023-09-06 DIAGNOSIS — E785 Hyperlipidemia, unspecified: Secondary | ICD-10-CM | POA: Diagnosis not present

## 2023-09-06 LAB — POCT INR: INR: 1.8 — AB (ref 2.0–3.0)

## 2023-09-06 NOTE — Progress Notes (Unsigned)
Cardiology Office Note:  .   Date:  07/06/2023 ID:  Rachel Odonnell, DOB 1955/12/12, MRN [id number] PCP: Sheela Stack  [facility] Providers Cardiologist:  Nona Dell, MD Cardiology APP:  Sharlene Dory, NP    History of Present Illness: Rachel Odonnell is a 68 y.o. female with a PMH of CAD, HLD, HTN, PAF, mild aortic valve stenosis, and OSA (compliant with CPAP), who presents today for 1 month follow-up.   Prior history of DES x 1 to prox to mid LAD, abrupt occlusion of diagonal that was jailed by stent. Successfully treated with POBA in August 2024.   Seen by Robin Searing, NP on 04/23/2023 after cardiac catheterization. Was overall feeling better but felt few episodes of fleeting chest pain. Was staying active. Was cleared to start cardiac rehab.   05/25/2023 - Today she presents for 1 month follow-up. She says her chest pain seems to be improved slightly, but not significantly. Describes chest pain as dull ache along left lateral side of chest wall, nonradiating. Denies any alleviating or aggravating factors, however says she takes NTG when this occurs, lays down, and pain is resolved. 5/10 on 0-10 pain scale. Says she is tired, not very active. Says she is not taking provigil. Says if she takes this medicine, she will not be able to get sleep and will up awake all night. Denies any shortness of breath, palpitations, syncope, presyncope, dizziness, orthopnea, PND, swelling or significant weight changes, acute bleeding, or claudication.  07/06/2023 - Presents for follow-up today with her husband.  She says that her and her husband have returned from a cruise not that long ago.  Has been recently getting over some sinusitis and bronchitis. She continues to notice CP, not as often compared to last office visit, says adjustment with Imdur has helped her symptoms. Continues to note fatigue. Denies any shortness of breath, palpitations, syncope, presyncope, dizziness,  orthopnea, PND, swelling or significant weight changes, acute bleeding, or claudication.  She is requesting to stop cardiac rehab at this point and just exercise at [employer].  She also sees a nutritionist regularly.  ROS: Negative. See HPI.   Studies Reviewed: .    Right/left heart cath 03/2023:    Prox RCA to Mid RCA lesion is 35% stenosed.   1st Mrg lesion is 60% stenosed.   Prox LAD to Mid LAD lesion is 60% stenosed.   A drug-eluting stent was successfully placed using a SYNERGY XD 3.0X38.   Post intervention, there is a 0% residual stenosis.   1st Diag lesion is 40% stenosed.   Balloon angioplasty was performed using a BALLN SAPPHIRE 2.0X12.   Post intervention, there is a 0% residual stenosis.   The left ventricular systolic function is normal.   LV end diastolic pressure is mildly elevated.   The left ventricular ejection fraction is 55-65% by visual estimate.   Hemodynamic findings consistent with mild pulmonary hypertension.   Recommend to resume Warfarin, at currently prescribed dose and frequency on 04/17/2023.   Recommend concurrent antiplatelet therapy of Aspirin 81 mg for 1 month and Clopidogrel 75mg  daily for 6 months .   Single vessel obstructive CAD. Long segment of disease in the proximal to mid LAD. Angiographically this did not appear to be severe but flow was impaired on both CT FFR and directly measured RFR. Severe plaque on IVUS with Minimal lumen diameter of 2.5 mm squared.  Normal LV function. Mildly elevated LV filling pressures. PCWP 21/23  mean 16 mm Hg. LVEDP 21 mm Hg Mild pulmonary HTN PAP 40/14 mean 27 mm Hg Cardiac output 4.3 L/min with index 2.08. Successful PCI of the proximal to mid LAD with IVUS guidance and DES x 1. Abrupt occlusion of diagonal that was jailed by stent. Successfully treated with POBA through the stent struts.    Plan: will monitor overnight on telemetry. DAPT with ASA for one month and Plavix for 6 months. May resume Coumadin  tomorrow if no bleeding problems.   Echo 02/2023:   1. Left ventricular ejection fraction, by estimation, is 60 to 65%. Left  ventricular ejection fraction by 3D volume is 63 %. The left ventricle has  normal function. The left ventricle has no regional wall motion  abnormalities. Left ventricular diastolic   parameters are consistent with Grade I diastolic dysfunction (impaired  relaxation). The average left ventricular global longitudinal strain is  -20.2 %. The global longitudinal strain is normal.   2. Right ventricular systolic function is normal. The right ventricular  size is normal.   3. The mitral valve is grossly normal. Trivial mitral valve  regurgitation. No evidence of mitral stenosis.   4. The aortic valve is calcified. There is mild calcification of the  aortic valve. There is mild thickening of the aortic valve. Aortic valve  regurgitation is not visualized. Mild aortic valve stenosis. Aortic valve  area, by VTI measures 1.55 cm.  Aortic valve mean gradient measures 12.0 mmHg. Aortic valve Vmax measures  2.32 m/s.  CCTA 02/2023: IMPRESSION: 1. Coronary calcium score of 153. This was 83rd percentile for age-, sex, and race-matched controls.   2. Total plaque volume 313 mm3 which is 64th percentile for age- and sex-matched controls (calcified plaque 59 mm3; non-calcified plaque 254 mm3). TPV is severe.   3. Normal coronary origin with right dominance.   4. There is moderate (50-69%) calcified plaque in the LAD and LCX. CAD-RADS 3.   5. Will send study for FFRct.  5. Aortic atherosclerosis.   IMPRESSION: 1. FFRct findings are consistent with significant stenosis in the mid LAD.   2.  Recommend cardiac catheterization.  Cardiac monitor 04/2021:  12 day monitor Rare supraventricular ectopy in the form of isolated PACs, couplets, triplets. Fourteen episodes of SVT longest 8 beats Rare ventricular ectopy in the form of isolated PVCs, couplets. Four episodes of  NSVT longest 15 beats. Reported symptoms correlated with sinus rhythm, PACs, PVCs, and afib Episodes of afib <1% burden, rates were controlled Single nocturnal pause 3.1 seconds     Patch Wear Time:  12 days and 7 hours (2022-08-22T11:20:36-0400 to 2022-09-03T18:39:38-0400)   Patient had a min HR of 45 bpm, max HR of 128 bpm, and avg HR of 58 bpm. Predominant underlying rhythm was Sinus Rhythm. First Degree AV Block was present. 4 Ventricular Tachycardia runs occurred, the run with the fastest interval lasting 15 beats with a  max rate of 128 bpm (avg 119 bpm); the run with the fastest interval was also the longest. 14 Supraventricular Tachycardia runs occurred, the run with the fastest interval lasting 8 beats with a max rate of 121 bpm (avg 99 bpm); the run with the fastest  interval was also the longest. Atrial Fibrillation occurred (<1% burden), ranging from 64-116 bpm (avg of 82 bpm), the longest lasting 4 mins 51 secs with an avg rate of 79 bpm. 1 Pause occurred lasting 3.1 secs (19 bpm). Atrial Fibrillation was  detected within +/- 45 seconds of symptomatic patient event(s). Isolated  SVEs were rare (<1.0%), SVE Couplets were rare (<1.0%), and SVE Triplets were rare (<1.0%). Isolated VEs were rare (<1.0%), VE Couplets were rare (<1.0%), and no VE Triplets were  present. Risk Assessment/Calculations:    CHA2DS2-VASc Score = 4  This indicates a 4.8% annual risk of stroke. The patient's score is based upon: CHF History: 0 HTN History: 1 Diabetes History: 0 Stroke History: 0 Vascular Disease History: 1 Age Score: 1 Gender Score: 1   The 10-year ASCVD risk score (Arnett DK, et al., 2019) is: 15.9%   Values used to calculate the score:     Age: 36 years     Sex: Female     Is Non-Hispanic African American: No     Diabetic: Yes     Tobacco smoker: No     Systolic Blood Pressure: 112 mmHg     Is BP treated: Yes     HDL Cholesterol: 25 mg/dL     Total Cholesterol: 138 mg/dL       Physical Exam:   VS:  BP 112/64   Pulse 63   Ht 5\' 4"  (1.626 m)   Wt 228 lb (103.4 kg)   SpO2 98%   BMI 39.14 kg/m    Wt Readings from Last 3 Encounters:  09/06/23 228 lb (103.4 kg)  07/14/23 223 lb (101.2 kg)  07/06/23 226 lb (102.5 kg)    GEN: Obese, 68 year old female in no acute distress NECK: No JVD; No carotid bruits CARDIAC: S1/S2, irregularly irregular rhythm, Grade 1 murmur, no rubs or gallops RESPIRATORY:  Clear to auscultation without rales, wheezing or rhonchi  EXTREMITIES:  No edema; No deformity   ASSESSMENT AND PLAN: .    CAD Improved chest pain from last OV.. Cardiac catheterization report noted above. Completed triple therapy per cardiac cath recommendations. Continue Plavix, rosuvastatin, Imdur, and NTG PRN.  Hesitant to uptitrate Imdur more at this point due to her soft BP.  Will increase fenofibrate to 160 mg daily and obtain labs as mentioned below.  Heart healthy diet and regular cardiovascular exercise encouraged. Care and ED precautions discussed.   HLD, hypertriglyceridemia LDL 44 05/2023.  Triglyceride levels were noted to be 474 at the time.  Will increase fenofibrate to 160 mg daily and at next office visit plan to obtain LFT/FLP. Continue rest of medication regimen. Heart healthy diet and regular cardiovascular exercise encouraged.  Follow-up with nutritionist as scheduled.  HTN BP stable. Discussed to monitor BP at home at least 2 hours after medications and sitting for 5-10 minutes.  No medication changes at this time. Heart healthy diet and regular cardiovascular exercise encouraged.   A-fib Denies any tachycardia or palpitations. HR well controlled today. [facility]. Continue Coumadin, denies any bleeding issues.   Mild aortic valve stenosis Mild aortic valve stenosis noted on most recent Eco with mean gradient measuring 12 mmHg. Denies any red flag signs/symptoms. Will continue to monitor and recommend updating Echo in 1  year, July 2025.  Care and ED precautions discussed.  Fatigue, medication management Etiology multifactorial.  Believe deconditioning is also playing a part of this.  Compliant with her CPAP. Recommended to slowly increase her physical activity.  Follow-up with nutritionist as scheduled.  Recommended to discuss this further with PCP. She verbalized understanding.  Will obtain the following labs: Thyroid panel, B12,  CMET, and Magnesium. Continue to follow with PCP.  7. Obesity Weight loss via diet and exercise encouraged. Discussed the impact being overweight would have on cardiovascular  risk.  Dispo: Follow-up with me/APP in 2-3 months or sooner if anything changes.   Signed, Sharlene Dory, NP

## 2023-09-06 NOTE — Patient Instructions (Signed)
Take warfarin 1 tablet tonight then resume 1/2 tablet daily except 1 tablet on Sundays, Tuesdays and Thursdays  Recheck INR in 1 week Pending spinal injection 1/30.  Pt to call MD to see if warfarin needs to be held.

## 2023-09-06 NOTE — Patient Instructions (Addendum)

## 2023-09-09 ENCOUNTER — Telehealth: Payer: Self-pay | Admitting: Pulmonary Disease

## 2023-09-09 ENCOUNTER — Other Ambulatory Visit: Payer: Self-pay

## 2023-09-09 ENCOUNTER — Telehealth: Payer: Self-pay | Admitting: Neurology

## 2023-09-09 ENCOUNTER — Telehealth: Payer: Self-pay | Admitting: Cardiology

## 2023-09-09 DIAGNOSIS — E1159 Type 2 diabetes mellitus with other circulatory complications: Secondary | ICD-10-CM

## 2023-09-09 MED ORDER — CLOPIDOGREL BISULFATE 75 MG PO TABS: 75.0000 mg | ORAL_TABLET | Freq: Every day | ORAL | 1 refills | Status: DC

## 2023-09-09 MED ORDER — INSULIN LISPRO (1 UNIT DIAL) 100 UNIT/ML (KWIKPEN): 14.0000 [IU] | PEN_INJECTOR | Freq: Three times a day (TID) | SUBCUTANEOUS | 0 refills | Status: DC

## 2023-09-09 MED ORDER — MODAFINIL 100 MG PO TABS: 100.0000 mg | ORAL_TABLET | Freq: Every day | ORAL | 0 refills | Status: DC

## 2023-09-09 NOTE — Telephone Encounter (Signed)
*  STAT* If patient is at the pharmacy, call can be transferred to refill team.   1. Which medications need to be refilled? (please list name of each medication and dose if known)   clopidogrel (PLAVIX) 75 MG tablet    2. Which pharmacy/location (including street and city if local pharmacy) is medication to be sent to? [facility] - [place], [place] - [address] Phone: [phone]  Fax: [phone]     3. Do they need a 30 day or 90 day supply? 90

## 2023-09-09 NOTE — Telephone Encounter (Signed)
Patient has changed pharmacies and now gets her medications from [facility]---she needs a refill on her Modafinil 100 mg---patient call back [phone] (last seen 04/21/23 by Dr. Vassie Loll)

## 2023-09-09 NOTE — Telephone Encounter (Signed)
Done

## 2023-09-09 NOTE — Telephone Encounter (Signed)
Switched pharmacies and needs new RX.

## 2023-09-09 NOTE — Telephone Encounter (Signed)
Pt called stating that her gabapentin (NEURONTIN) 100 MG capsule  is needing a PA and would like the nurse to go ahead and start the process if possible.

## 2023-09-13 ENCOUNTER — Telehealth: Payer: Self-pay

## 2023-09-13 ENCOUNTER — Other Ambulatory Visit (HOSPITAL_COMMUNITY): Payer: Self-pay

## 2023-09-13 NOTE — Telephone Encounter (Signed)
Pharmacy Patient Advocate Encounter   Received notification from Physician's Office that prior authorization for Gabapentin 100mg  Capsule is required/requested.   Insurance verification completed.   The patient is insured through [facility] .   Per test claim: The current 30 day co-pay is, $5.00.  No PA needed at this time. This test claim was processed through [facility]- copay amounts may vary at other pharmacies due to pharmacy/plan contracts, or as the patient moves through the different stages of their insurance plan.

## 2023-09-15 ENCOUNTER — Ambulatory Visit: Payer: Medicare Other | Attending: Cardiology | Admitting: *Deleted

## 2023-09-15 DIAGNOSIS — Z5181 Encounter for therapeutic drug level monitoring: Secondary | ICD-10-CM

## 2023-09-15 DIAGNOSIS — I4821 Permanent atrial fibrillation: Secondary | ICD-10-CM

## 2023-09-15 LAB — POCT INR: INR: 2 (ref 2.0–3.0)

## 2023-09-15 NOTE — Patient Instructions (Signed)
Continue warfarin 1/2 tablet daily except 1 tablet on Sundays, Tuesdays and Thursdays  Recheck INR in 3 week Pending spinal injection 1/30.  Does not have to hold warfarin per Dr Debbra Riding.

## 2023-09-16 DIAGNOSIS — M47816 Spondylosis without myelopathy or radiculopathy, lumbar region: Secondary | ICD-10-CM | POA: Diagnosis not present

## 2023-09-17 ENCOUNTER — Telehealth: Payer: Self-pay

## 2023-09-17 ENCOUNTER — Encounter (HOSPITAL_COMMUNITY): Payer: Self-pay

## 2023-09-17 ENCOUNTER — Other Ambulatory Visit: Payer: Self-pay

## 2023-09-17 ENCOUNTER — Emergency Department (HOSPITAL_COMMUNITY)
Admission: EM | Admit: 2023-09-17 | Discharge: 2023-09-17 | Disposition: A | Discharge status: Home / Self Care | Payer: Medicare Other | Attending: Emergency Medicine | Admitting: Emergency Medicine

## 2023-09-17 DIAGNOSIS — D72829 Elevated white blood cell count, unspecified: Secondary | ICD-10-CM | POA: Insufficient documentation

## 2023-09-17 DIAGNOSIS — E1165 Type 2 diabetes mellitus with hyperglycemia: Secondary | ICD-10-CM | POA: Insufficient documentation

## 2023-09-17 DIAGNOSIS — Z794 Long term (current) use of insulin: Secondary | ICD-10-CM | POA: Insufficient documentation

## 2023-09-17 DIAGNOSIS — Z79899 Other long term (current) drug therapy: Secondary | ICD-10-CM | POA: Insufficient documentation

## 2023-09-17 DIAGNOSIS — R739 Hyperglycemia, unspecified: Secondary | ICD-10-CM | POA: Diagnosis present

## 2023-09-17 DIAGNOSIS — I10 Essential (primary) hypertension: Secondary | ICD-10-CM | POA: Diagnosis not present

## 2023-09-17 DIAGNOSIS — Z7984 Long term (current) use of oral hypoglycemic drugs: Secondary | ICD-10-CM | POA: Insufficient documentation

## 2023-09-17 LAB — URINALYSIS, ROUTINE W REFLEX MICROSCOPIC
Bacteria, UA: NONE SEEN
Bilirubin Urine: NEGATIVE
Glucose, UA: >=500 mg/dL — AB
Ketones, ur: NEGATIVE mg/dL
Leukocytes,Ua: NEGATIVE
Nitrite: NEGATIVE
Protein, ur: NEGATIVE mg/dL
Specific Gravity, Urine: 1.026 (ref 1.005–1.030)
pH: 6 (ref 5.0–8.0)

## 2023-09-17 LAB — COMPREHENSIVE METABOLIC PANEL
ALT: 32 U/L (ref 0–44)
AST: 34 U/L (ref 15–41)
Albumin: 3.8 g/dL (ref 3.5–5.0)
Alkaline Phosphatase: 68 U/L (ref 38–126)
Anion gap: 13 (ref 5–15)
BUN: 22 mg/dL (ref 8–23)
CO2: 21 mmol/L — ABNORMAL LOW (ref 22–32)
Calcium: 10.2 mg/dL (ref 8.9–10.3)
Chloride: 98 mmol/L (ref 98–111)
Creatinine, Ser: 1.03 mg/dL — ABNORMAL HIGH (ref 0.44–1.00)
GFR, Estimated: 59 mL/min — ABNORMAL LOW (ref >60)
Glucose, Bld: 506 mg/dL (ref 70–99)
Potassium: 4 mmol/L (ref 3.5–5.1)
Sodium: 132 mmol/L — ABNORMAL LOW (ref 135–145)
Total Bilirubin: 0.5 mg/dL (ref 0.0–1.2)
Total Protein: 7.1 g/dL (ref 6.5–8.1)

## 2023-09-17 LAB — BLOOD GAS, VENOUS
Acid-Base Excess: 2.9 mmol/L — ABNORMAL HIGH (ref 0.0–2.0)
Bicarbonate: 27.9 mmol/L (ref 20.0–28.0)
Drawn by: 7012
O2 Saturation: 39.9 %
Patient temperature: 36.8
pCO2, Ven: 43 mm[Hg] — ABNORMAL LOW (ref 44–60)
pH, Ven: 7.42 (ref 7.25–7.43)
pO2, Ven: <31 mm[Hg] — CL (ref 32–45)

## 2023-09-17 LAB — CBC WITH DIFFERENTIAL/PLATELET
Abs Immature Granulocytes: 0.06 10*3/uL (ref 0.00–0.07)
Basophils Absolute: 0 10*3/uL (ref 0.0–0.1)
Basophils Relative: 0 %
Eosinophils Absolute: 0 10*3/uL (ref 0.0–0.5)
Eosinophils Relative: 0 %
HCT: 38.6 % (ref 36.0–46.0)
Hemoglobin: 13.3 g/dL (ref 12.0–15.0)
Immature Granulocytes: 1 %
Lymphocytes Relative: 20 %
Lymphs Abs: 2.3 10*3/uL (ref 0.7–4.0)
MCH: 30.8 pg (ref 26.0–34.0)
MCHC: 34.5 g/dL (ref 30.0–36.0)
MCV: 89.4 fL (ref 80.0–100.0)
Monocytes Absolute: 0.6 10*3/uL (ref 0.1–1.0)
Monocytes Relative: 5 %
Neutro Abs: 8.5 10*3/uL — ABNORMAL HIGH (ref 1.7–7.7)
Neutrophils Relative %: 74 %
Platelets: 285 10*3/uL (ref 150–400)
RBC: 4.32 MIL/uL (ref 3.87–5.11)
RDW: 13.1 % (ref 11.5–15.5)
WBC: 11.4 10*3/uL — ABNORMAL HIGH (ref 4.0–10.5)
nRBC: 0 % (ref 0.0–0.2)

## 2023-09-17 LAB — CBG MONITORING, ED
Glucose-Capillary: 434 mg/dL — ABNORMAL HIGH (ref 70–99)
Glucose-Capillary: 421 mg/dL — ABNORMAL HIGH (ref 70–99)
Glucose-Capillary: 384 mg/dL — ABNORMAL HIGH (ref 70–99)
Glucose-Capillary: 368 mg/dL — ABNORMAL HIGH (ref 70–99)
Glucose-Capillary: 367 mg/dL — ABNORMAL HIGH (ref 70–99)

## 2023-09-17 LAB — BETA-HYDROXYBUTYRIC ACID: Beta-Hydroxybutyric Acid: 0.27 mmol/L (ref 0.05–0.27)

## 2023-09-17 MED ORDER — LACTATED RINGERS IV BOLUS
1000.0000 mL | Freq: Once | INTRAVENOUS | Status: AC
  Administered 2023-09-17: 1000 mL via INTRAVENOUS

## 2023-09-17 MED ORDER — OSELTAMIVIR PHOSPHATE 75 MG PO CAPS: 75.0000 mg | ORAL_CAPSULE | Freq: Two times a day (BID) | ORAL | Status: DC

## 2023-09-17 MED ORDER — INSULIN ASPART 100 UNIT/ML IJ SOLN
15.0000 [IU] | Freq: Once | INTRAMUSCULAR | Status: AC
  Administered 2023-09-17: 15 [IU] via SUBCUTANEOUS
  Filled 2023-09-17: qty 1

## 2023-09-17 NOTE — Telephone Encounter (Signed)
Pt called stating she received a steroid injection in her back yesterday. States her BG is 445 per finger stick. States she's experiencing dizziness, light headed, headache and unsteady gait. Pt has Libre CGM, data uploaded and shared with Dr.Nida. After discussing pt symptoms and evaluation of CGM data with Dr.Nida pt is advised to go to the ED for evaluation and treatment per Dr.Nida's orders. Pt voiced understanding.

## 2023-09-17 NOTE — Inpatient Diabetes Management (Signed)
Inpatient Diabetes Program Recommendations  AACE/ADA: New Consensus Statement on Inpatient Glycemic Control (2015)  Target Ranges:  Prepandial:   less than 140 mg/dL      Peak postprandial:   less than 180 mg/dL (1-2 hours)      Critically ill patients:  140 - 180 mg/dL   Lab Results  Component Value Date   GLUCAP 434 (H) 09/17/2023   HGBA1C 10.6 (A) 07/14/2023    Review of Glycemic Control  Diabetes history: DM 2 Outpatient Diabetes medications:  FSL 2, Glipizide 5 mg Daily, Tresiba 110 units qhs, Humalog 14-20 units tid, metformin 500 mg Daily Current orders for Inpatient glycemic control:  Being evaluated in ED  A1c 10.6% on 07/14/2023, not well controlled outpatient at baseline Endocrinologist: Dr. Fransico Him with [facility], last visit on 11/27 Steroid injection on 1/30 Inpatient Diabetes Program Recommendations:    When appropriate for basal bolus regimen consider: -   CBGs Q4 -   Semglee 50 units bid -   Novolog 0-20 units Q4 for overnight coverage as well -   Novolog 15 units tid meal coverage if eating >50% of meals   Follow up with Dr. Fransico Him within 1 week after discharge. Will need increase in insulin doses until steroid cleared from system.  Thanks,  Christena Deem RN, MSN, BC-ADM Inpatient Diabetes Coordinator Team Pager [phone] (8a-5p)

## 2023-09-17 NOTE — ED Notes (Signed)
Pt denies needs at this time. Family at bedside.  

## 2023-09-17 NOTE — ED Notes (Signed)
Stretcher adjusted for pt comfort and pillow provided.

## 2023-09-17 NOTE — ED Provider Notes (Signed)
Patient with a history of diabetes and vertigo.  She got a shot of steroids yesterday and her sugar has been elevated.  Patient has been given fluids and insulin and her glucose has come down to the 300s.  Patient wants to be discharged home.  She will increase her nighttime dose insulin about 10% for 2 days and take her Antivert for her dizziness drink plenty of fluids and follow-up with her PCP   Bethann Berkshire, MD 09/17/23 1704

## 2023-09-17 NOTE — ED Triage Notes (Signed)
Pt arrived via POV c/o hyperglycemia. Pt called her PCP and reports she advised to come to the ER for treatment. Pt reports she checked her CBG before leaving and her Libre device read a blood sugar of 478. Pt reports she ate a peanut butter sandwich this morning and did receive a steroid injection yesterday. Pt reports she took 20 units of her Insulin apprx 2hr PTA.

## 2023-09-17 NOTE — ED Notes (Signed)
Pts CBG in Triage is 434. Pt does endorse excessive thirst, headache and lower back pain.

## 2023-09-17 NOTE — Discharge Instructions (Addendum)
Increase your nighttime insulin so you are taking 120.  Do that for couple days.  Take your Antivert for dizziness and drink plenty of fluids

## 2023-09-17 NOTE — ED Provider Notes (Incomplete)
[place] EMERGENC[facility] DEPARTMENT AT [facility] Provider Note   CSN: [id number] Arrival date & time: 09/17/23  1102     History {Add pertinent medical, surgical, social history, OB history to HPI:1} Chief Complaint  Patient presents with   Hyperglycemia    Rachel Odonnell is a 68 y.o. female with PMH as listed below who presents POV c/o hyperglycemia. Pt called her PCP and reports she advised to come to the ER for treatment. Pt reports she checked her CBG before leaving and her Libre device read a blood sugar of 478. Pt reports she ate a peanut butter sandwich this morning and did receive a steroid injection yesterday. Pt reports she took 20 units of her Insulin apprx 2hr PTA.   Past Medical History:  Diagnosis Date   ADD (attention deficit disorder)    Diabetes mellitus without complication (HCC)    Hypertension        Home Medications Prior to Admission medications   Medication Sig Start Date End Date Taking? Authorizing Provider  Accu-Chek Softclix Lancets lancets Use as instructed to test blood glucose four times daily 06/09/22   Roma Kayser, MD  busPIRone (BUSPAR) 5 MG tablet Take 5 mg by mouth at bedtime. 01/27/22   [provider]  Cholecalciferol (VITAMIN D3) 125 MCG (5000 UT) CAPS Take 1 capsule (5,000 Units total) by mouth daily. 12/16/21   Roma Kayser, MD  citalopram (CELEXA) 20 MG tablet Take 1 tablet (20 mg total) by mouth every evening. 06/09/23   Sharlene Dory, NP  clopidogrel (PLAVIX) 75 MG tablet Take 1 tablet (75 mg total) by mouth daily. 09/09/23   Sharlene Dory, NP  Continuous Glucose Receiver (FREESTYLE LIBRE 2 READER) DEVI USE TO CHECK GLUCOSE AS DIRECTED 12/04/22   Roma Kayser, MD  Continuous Glucose Sensor (FREESTYLE LIBRE 2 SENSOR) MISC Change sensor every 14 days. Use to check glucose continuously 08/26/23   Roma Kayser, MD  donepezil (ARICEPT) 5 MG tablet Take 5 mg by mouth every evening. 08/06/22    [provider]  estradiol (ESTRACE) 0.1 MG/GM vaginal cream Discard plastic applicator. Insert a blueberry size amount (approximately 1 gram) of cream on fingertip inside vagina at bedtime every night for 1 week then every other night. For long term use. 08/03/23   Donnita Falls, FNP  fenofibrate 160 MG tablet Take 1 tablet (160 mg total) by mouth daily. 07/06/23   Sharlene Dory, NP  gabapentin (NEURONTIN) 100 MG capsule Take 1 capsule (100 mg total) by mouth at bedtime. 04/17/23   Dunn, Tacey Ruiz, PA-C  glipiZIDE (GLUCOTROL) 5 MG tablet Take 1 tablet (5 mg total) by mouth daily. 08/26/23   Roma Kayser, MD  glucose blood test strip 1 each by Other route as needed. Use as instructed to test blood glucose four times daily 06/09/22   Roma Kayser, MD  indapamide (LOZOL) 2.5 MG tablet TAKE 1 TABLET EVERY DAY 08/16/23   McKenzie, Mardene Celeste, MD  insulin degludec (TRESIBA FLEXTOUCH) 200 UNIT/ML FlexTouch Pen Inject 110 Units into the skin at bedtime. 08/26/23   Roma Kayser, MD  insulin lispro (HUMALOG KWIKPEN) 100 UNIT/ML KwikPen Inject 14-20 Units into the skin 3 (three) times daily with meals. 09/09/23   Roma Kayser, MD  Insulin Pen Needle (B-D ULTRAFINE III SHORT PEN) 31G X 8 MM MISC 1 each by Does not apply route as directed. 01/06/22   Roma Kayser, MD  isosorbide mononitrate (IMDUR) 60 MG  24 hr tablet Take 1.5 tablets (90 mg total) by mouth daily. Can take an additional half tab as needed for chest pain 05/25/23   Sharlene Dory, NP  levocetirizine (XYZAL) 5 MG tablet Take 5 mg by mouth every evening.    [provider]  levothyroxine (SYNTHROID) 50 MCG tablet Take 1 tablet (50 mcg total) by mouth daily before breakfast. 08/26/23   Nida, Denman George, MD  Magnesium Oxide (MAGNESIUM OXIDE 400) 240 MG PACK Take 400 mg by mouth daily. 07/19/23   Sharlene Dory, NP  metFORMIN (GLUCOPHAGE) 500 MG tablet Take 1 tablet (500 mg total) by mouth  daily with breakfast. 08/26/23   Nida, Denman George, MD  modafinil (PROVIGIL) 100 MG tablet Take 1 tablet (100 mg total) by mouth daily. 09/09/23   Oretha Milch, MD  montelukast (SINGULAIR) 10 MG tablet Take 10 mg by mouth at bedtime.    [provider]  Multiple Vitamin (MULTIVITAMIN ADULT PO) Take 1 tablet by mouth daily.    [provider]  nitroGLYCERIN (NITROSTAT) 0.4 MG SL tablet Place 1 tablet (0.4 mg total) under the tongue every 5 (five) minutes as needed for chest pain. 04/12/23   Sharlene Dory, PA-C  OVER THE COUNTER MEDICATION Take 2 capsules by mouth daily. Omega XL    [provider]  pantoprazole (PROTONIX) 40 MG tablet TAKE 2 TABLETS EVERY DAY 08/19/23   Sharlene Dory, NP  rOPINIRole (REQUIP) 2 MG tablet Take 2 mg by mouth 2 (two) times daily. 06/22/23   [provider]  rosuvastatin (CRESTOR) 40 MG tablet Take 1 tablet (40 mg total) by mouth daily. 04/17/23   Dunn, Tacey Ruiz, PA-C  Vibegron (GEMTESA) 75 MG TABS Take 1 tablet (75 mg total) by mouth daily. 08/03/23   Donnita Falls, FNP  warfarin (COUMADIN) 5 MG tablet TAKE 1/2 TO 1 TABLET BY MOUTH DAILY AS DIRECTED BY THE COUMADIN CLINIC. 05/28/23   Antoine Poche, MD      Allergies    Azithromycin, Codeine, Atomoxetine, Atorvastatin, and Lisinopril    Review of Systems   Review of Systems A 10 point review of systems was performed and is negative unless otherwise reported in HPI.  Physical Exam Updated Vital Signs BP (!) 139/46 (BP Location: Right Arm)   Pulse (!) 55   Temp 97.7 F (36.5 C) (Oral)   Resp 18   Ht 5\' 4"  (1.626 m)   Wt 103.4 kg   SpO2 97%   BMI 39.13 kg/m  Physical Exam General: Normal appearing {Desc; female/female:11659}, lying in bed.  HEENT: PERRLA, Sclera anicteric, MMM, trachea midline.  Cardiology: RRR, no murmurs/rubs/gallops. BL radial and DP pulses equal bilaterally.  Resp: Normal respiratory rate and effort. CTAB, no wheezes, rhonchi, crackles.  Abd:  Soft, non-tender, non-distended. No rebound tenderness or guarding.  GU: Deferred. MSK: No peripheral edema or signs of trauma. Extremities without deformity or TTP. No cyanosis or clubbing. Skin: warm, dry. No rashes or lesions. Back: No CVA tenderness Neuro: A&Ox4, CNs II-XII grossly intact. MAEs. Sensation grossly intact.  Psych: Normal mood and affect.   ED Results / Procedures / Treatments   Labs (all labs ordered are listed, but only abnormal results are displayed) Labs Reviewed  CBG MONITORING, ED - Abnormal; Notable for the following components:      Result Value   Glucose-Capillary 434 (*)    All other components within normal limits  CBC WITH DIFFERENTIAL/PLATELET  COMPREHENSIVE METABOLIC PANEL  BLOOD GAS, VENOUS  URINALYSIS, ROUTINE W REFLEX MICROSCOPIC    EKG None  Radiology No results found.  Procedures Procedures  {Document cardiac monitor, telemetry assessment procedure when appropriate:1}  Medications Ordered in ED Medications - No data to display  ED Course/ Medical Decision Making/ A&P                          Medical Decision Making Amount and/or Complexity of Data Reviewed Labs: ordered.    This patient presents to the ED for concern of ***, this involves an extensive number of treatment options, and is a complaint that carries with it a high risk of complications and morbidity.  I considered the following differential and admission for this acute, potentially life threatening condition.   MDM:    ***  Clinical Course as of 09/17/23 1235  Fri Sep 17, 2023  1152 WBC(!): 11.4 Mild leukocytosis w/ left shift [HN]  1152 Glucose-Capillary(!): 434 hyperglycemia [HN]  1233 Glucose(!!): 506 [HN]  1234 pH, Ven: 7.42 No acidosis [HN]  1234 Anion gap: 13 No elevated anion gap [HN]    Clinical Course User Index [HN] Loetta Rough, MD    Labs: I Ordered, and personally interpreted labs.  The pertinent results include:  ***  Imaging Studies  ordered: I ordered imaging studies including *** I independently visualized and interpreted imaging. I agree with the radiologist interpretation  Additional history obtained from ***.  External records from outside source obtained and reviewed including ***  Cardiac Monitoring: The patient was maintained on a cardiac monitor.  I personally viewed and interpreted the cardiac monitored which showed an underlying rhythm of: ***  Reevaluation: After the interventions noted above, I reevaluated the patient and found that they have :{resolved/improved/worsened:23923::"improved"}  Social Determinants of Health: ***  Disposition:  ***  Co morbidities that complicate the patient evaluation  Past Medical History:  Diagnosis Date   ADD (attention deficit disorder)    Diabetes mellitus without complication (HCC)    Hypertension      Medicines No orders of the defined types were placed in this encounter.   I have reviewed the patients home medicines and have made adjustments as needed  Problem List / ED Course: Problem List Items Addressed This Visit   None        {Document critical care time when appropriate:1} {Document review of labs and clinical decision tools ie heart score, Chads2Vasc2 etc:1}  {Document your independent review of radiology images, and any outside records:1} {Document your discussion with family members, caretakers, and with consultants:1} {Document social determinants of health affecting pt's care:1} {Document your decision making why or why not admission, treatments were needed:1}  This note was created using dictation software, which may contain spelling or grammatical errors.

## 2023-09-23 MED ORDER — MODAFINIL 100 MG PO TABS: 100.0000 mg | ORAL_TABLET | Freq: Every day | ORAL | 0 refills | Status: DC

## 2023-09-23 NOTE — Telephone Encounter (Signed)
90 day supply sent to [facility].

## 2023-09-30 ENCOUNTER — Telehealth: Payer: Self-pay

## 2023-09-30 NOTE — Telephone Encounter (Signed)
Can you let her know her samples are up front. Thanks.

## 2023-09-30 NOTE — Telephone Encounter (Signed)
Patient is completely out of Gemtesa samples.  Please advise.  Call:  [phone]

## 2023-10-06 ENCOUNTER — Ambulatory Visit: Payer: Medicare Other

## 2023-10-07 ENCOUNTER — Encounter: Payer: Medicare Other | Attending: Cardiology

## 2023-10-07 DIAGNOSIS — Z5181 Encounter for therapeutic drug level monitoring: Secondary | ICD-10-CM

## 2023-10-07 DIAGNOSIS — I4821 Permanent atrial fibrillation: Secondary | ICD-10-CM | POA: Diagnosis not present

## 2023-10-07 LAB — POCT INR: INR: 1.9 — AB (ref 2.0–3.0)

## 2023-10-07 NOTE — Patient Instructions (Signed)
Increase warfarin to 1 tablet daily except 1/2 tablet on Mondays, Wednesdays and Fridays  Recheck INR in 4 week

## 2023-10-11 ENCOUNTER — Telehealth: Payer: Self-pay | Admitting: Urology

## 2023-10-11 DIAGNOSIS — N3281 Overactive bladder: Secondary | ICD-10-CM

## 2023-10-11 MED ORDER — GEMTESA 75 MG PO TABS: 75.0000 mg | ORAL_TABLET | Freq: Every day | ORAL | Status: DC

## 2023-10-11 NOTE — Telephone Encounter (Signed)
 Samples of Gemtesa upfront.

## 2023-10-11 NOTE — Telephone Encounter (Signed)
 Please call in rx to [facility] of Gemtesa and she would like to pick up samples today when she comes to her husbands appointment.

## 2023-10-12 ENCOUNTER — Telehealth: Payer: Self-pay

## 2023-10-12 DIAGNOSIS — M47816 Spondylosis without myelopathy or radiculopathy, lumbar region: Secondary | ICD-10-CM | POA: Diagnosis not present

## 2023-10-12 NOTE — Telephone Encounter (Signed)
 Pt called stating she has a friend that was recently started on an insulin pump and is no longer taking Mounjaro. Pt states she now has access to a 7 month supply of Mounjaro 5mg  and 7.5mg  doses. Pt stated she would like to try taking the [facility]. Pt is now taking Glipizide 5mg  every day, Tresiba 110 units at bedtime, Humalog 14-20 units tid before meals and Metformin 500mg  bid. Please advise.

## 2023-10-18 DIAGNOSIS — K219 Gastro-esophageal reflux disease without esophagitis: Secondary | ICD-10-CM | POA: Diagnosis not present

## 2023-10-18 DIAGNOSIS — I4891 Unspecified atrial fibrillation: Secondary | ICD-10-CM | POA: Diagnosis not present

## 2023-10-18 DIAGNOSIS — I2510 Atherosclerotic heart disease of native coronary artery without angina pectoris: Secondary | ICD-10-CM | POA: Diagnosis not present

## 2023-10-18 DIAGNOSIS — E785 Hyperlipidemia, unspecified: Secondary | ICD-10-CM | POA: Diagnosis not present

## 2023-10-18 DIAGNOSIS — E1165 Type 2 diabetes mellitus with hyperglycemia: Secondary | ICD-10-CM | POA: Diagnosis not present

## 2023-10-18 DIAGNOSIS — I482 Chronic atrial fibrillation, unspecified: Secondary | ICD-10-CM | POA: Diagnosis not present

## 2023-10-18 LAB — LAB REPORT - SCANNED
A1c: 11
EGFR: 67.7

## 2023-10-18 NOTE — Telephone Encounter (Signed)
 Can someone call and relay this to pt

## 2023-10-18 NOTE — Telephone Encounter (Signed)
 Pt states that she took the 5 mg Mounjaro before hearing back from [place] and has now been sick.  Please Advise

## 2023-10-18 NOTE — Telephone Encounter (Signed)
 Spoke with pt advising her not to take any medication that has not been prescribed for her, to continue taking Glipizide 5mg  every day, Tresiba 110 units at bedtime, Humalog 14-20 units tid before meals and Metformin 500mg  bid per Dr.Nida's orders. Pt voiced understanding.

## 2023-10-19 ENCOUNTER — Encounter: Payer: Self-pay | Admitting: Physician Assistant

## 2023-10-21 ENCOUNTER — Telehealth: Payer: Self-pay

## 2023-10-21 NOTE — Telephone Encounter (Signed)
 Pt called stating she has been experiencing hypoglycemic episodes since yesterday evening. States she took [place] that had not been prescribed for her earlier in the week. She has taken her Rx'd medications Tresiba 100 units last night, Glipizide 5mg  yesterday evening and Metformin 500mg  yesterday evening. Pt has not taken any Novolog. Pt's Libre CGM data uploaded, printed and evaluated by Dr. Fransico Him. Advised pt not to take any more Mounjaro, decrease Novolog to 10-16 units tid prior to meals when glucose is >90, decrease Tresiba to 80 units at bedtime and if glucose is less than 100 before bed eat a snack, continue taking Metformin 500mg  daily and discontinue glipizide per Dr. Isidoro Donning orders. Advised pt if her glucose is >  200 x 3 or below 70 to contact the office. Pt voiced understanding,

## 2023-10-21 NOTE — Telephone Encounter (Signed)
 Patient left a VM that her BS has been bottoming out. Pt is stating this is emergent and needs a call back

## 2023-10-21 NOTE — Telephone Encounter (Signed)
 Addressed pt's hypoglycemia in a separate encounter.

## 2023-10-26 ENCOUNTER — Telehealth: Payer: Self-pay

## 2023-10-26 ENCOUNTER — Other Ambulatory Visit: Payer: Self-pay

## 2023-10-26 DIAGNOSIS — N3281 Overactive bladder: Secondary | ICD-10-CM

## 2023-10-26 DIAGNOSIS — N3941 Urge incontinence: Secondary | ICD-10-CM

## 2023-10-26 MED ORDER — GEMTESA 75 MG PO TABS: 1.0000 | ORAL_TABLET | Freq: Every day | ORAL | 3 refills | Status: DC

## 2023-10-26 NOTE — Telephone Encounter (Signed)
 Pt called stating her glucose has been >than 200. Pt is taking Novolog 10-16 units TIDAC, Tresiba 80 units at bedtime and Metformin 500mg  every day. She uses a Libre CGM to monitor glucose, data was uploaded and reviewed by Dr.Nida. Advised pt to increase her Novolog to 14-20 units [facility] and continue her Evaristo Bury and Metformin as previously instructed per Dr.Nida's orders. Pt voiced understanding.

## 2023-10-30 ENCOUNTER — Other Ambulatory Visit: Payer: Self-pay | Admitting: "Endocrinology

## 2023-10-30 DIAGNOSIS — E1159 Type 2 diabetes mellitus with other circulatory complications: Secondary | ICD-10-CM

## 2023-11-03 ENCOUNTER — Encounter: Attending: Cardiology | Admitting: *Deleted

## 2023-11-03 DIAGNOSIS — Z5181 Encounter for therapeutic drug level monitoring: Secondary | ICD-10-CM | POA: Diagnosis not present

## 2023-11-03 DIAGNOSIS — I4821 Permanent atrial fibrillation: Secondary | ICD-10-CM | POA: Insufficient documentation

## 2023-11-03 LAB — POCT INR: INR: 2.7 (ref 2.0–3.0)

## 2023-11-03 NOTE — Patient Instructions (Signed)
 Did not increase dose as instructed last visit. Will continue warfarin 1/2 tablet daily except 1 tablet on Sundays, Tuesdays and Thursdays  Recheck INR in 4 week

## 2023-11-04 ENCOUNTER — Telehealth: Payer: Self-pay

## 2023-11-04 ENCOUNTER — Other Ambulatory Visit: Payer: Self-pay | Admitting: Nurse Practitioner

## 2023-11-04 NOTE — Telephone Encounter (Signed)
 Pt called stating her glucose readings have been over 300. Pt is injecting Novolog 14-20 units tid with meals, Tresiba 80 units at bedtime and taking Metformin 500mg  po every day. Pt's Libre CGM data uploaded and evaluated by Dr.Nida. Advised pt to increase Tresiba to 100 units at bedtime and to continue Novolog and Metformin  per Dr.Nida's orders. Pt voiced understanding.

## 2023-11-06 ENCOUNTER — Other Ambulatory Visit: Payer: Self-pay | Admitting: "Endocrinology

## 2023-11-06 DIAGNOSIS — E1159 Type 2 diabetes mellitus with other circulatory complications: Secondary | ICD-10-CM

## 2023-11-10 ENCOUNTER — Other Ambulatory Visit: Payer: Self-pay | Admitting: "Endocrinology

## 2023-11-10 DIAGNOSIS — E1159 Type 2 diabetes mellitus with other circulatory complications: Secondary | ICD-10-CM

## 2023-11-10 DIAGNOSIS — E039 Hypothyroidism, unspecified: Secondary | ICD-10-CM

## 2023-11-11 ENCOUNTER — Encounter: Payer: Self-pay | Admitting: "Endocrinology

## 2023-11-11 ENCOUNTER — Ambulatory Visit: Payer: Medicare HMO | Admitting: "Endocrinology

## 2023-11-11 VITALS — BP 118/60 | HR 60 | Ht 64.0 in | Wt 225.4 lb

## 2023-11-11 DIAGNOSIS — E559 Vitamin D deficiency, unspecified: Secondary | ICD-10-CM | POA: Diagnosis not present

## 2023-11-11 DIAGNOSIS — I10 Essential (primary) hypertension: Secondary | ICD-10-CM

## 2023-11-11 DIAGNOSIS — E1159 Type 2 diabetes mellitus with other circulatory complications: Secondary | ICD-10-CM

## 2023-11-11 DIAGNOSIS — Z7984 Long term (current) use of oral hypoglycemic drugs: Secondary | ICD-10-CM | POA: Diagnosis not present

## 2023-11-11 DIAGNOSIS — Z6838 Body mass index (BMI) 38.0-38.9, adult: Secondary | ICD-10-CM

## 2023-11-11 DIAGNOSIS — E039 Hypothyroidism, unspecified: Secondary | ICD-10-CM

## 2023-11-11 DIAGNOSIS — Z794 Long term (current) use of insulin: Secondary | ICD-10-CM | POA: Diagnosis not present

## 2023-11-11 DIAGNOSIS — E782 Mixed hyperlipidemia: Secondary | ICD-10-CM | POA: Diagnosis not present

## 2023-11-11 DIAGNOSIS — K760 Fatty (change of) liver, not elsewhere classified: Secondary | ICD-10-CM

## 2023-11-11 NOTE — Progress Notes (Signed)
 11/11/2023, 6:26 PM   Endocrinology follow-up note  Subjective:    Patient ID: Rachel Odonnell, female    DOB: 11/09/1955.  Rachel Odonnell is being seen in follow-up after she was seen in consultation for  management of currently uncontrolled, symptomatic type 2 diabetes requested by  Royann Shivers, PA-C.   Past Medical History:  Diagnosis Date   ADD (attention deficit disorder)    Diabetes mellitus without complication (HCC)    Hypertension     Past Surgical History:  Procedure Laterality Date   ABDOMINAL HYSTERECTOMY     COLONOSCOPY  02/2022   CORONARY PRESSURE/FFR STUDY N/A 04/16/2023   Procedure: CORONARY PRESSURE/FFR STUDY;  Surgeon: Swaziland, Peter M, MD;  Location: [facility] INVASIVE CV LAB;  Service: Cardiovascular;  Laterality: N/A;   CORONARY STENT INTERVENTION N/A 04/16/2023   Procedure: CORONARY STENT INTERVENTION;  Surgeon: Swaziland, Peter M, MD;  Location: [facility] INVASIVE CV LAB;  Service: Cardiovascular;  Laterality: N/A;   CORONARY ULTRASOUND/IVUS N/A 04/16/2023   Procedure: Coronary Ultrasound/IVUS;  Surgeon: Swaziland, Peter M, MD;  Location: [facility] INVASIVE CV LAB;  Service: Cardiovascular;  Laterality: N/A;   EXCISIONAL HEMORRHOIDECTOMY     INCONTINENCE SURGERY     RIGHT/LEFT HEART CATH AND CORONARY ANGIOGRAPHY N/A 06/13/2021   Procedure: RIGHT/LEFT HEART CATH AND CORONARY ANGIOGRAPHY;  Surgeon: Swaziland, Peter M, MD;  Location: [facility] INVASIVE CV LAB;  Service: Cardiovascular;  Laterality: N/A;   RIGHT/LEFT HEART CATH AND CORONARY ANGIOGRAPHY N/A 04/16/2023   Procedure: RIGHT/LEFT HEART CATH AND CORONARY ANGIOGRAPHY;  Surgeon: Swaziland, Peter M, MD;  Location: [facility] INVASIVE CV LAB;  Service: Cardiovascular;  Laterality: N/A;    Social History   Socioeconomic History   Marital status: Married    Spouse name: Shon Hale   Number of children: Not on file   Years of education: Not on file   Highest education  level: Not on file  Occupational History   Not on file  Tobacco Use   Smoking status: Former    Current packs/day: 0.00    Types: Cigarettes    Quit date: 08/17/1994    Years since quitting: 29.2   Smokeless tobacco: Never  Vaping Use   Vaping status: Never Used  Substance and Sexual Activity   Alcohol use: No   Drug use: No   Sexual activity: Yes  Other Topics Concern   Not on file  Social History Narrative   Lives at home with husband, Shon Hale   Former smoker Smoked 2 ppd x 25 yrs; Quit in 1996   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, retail  retired Secondary school teacher, still works part-time at the [employer], used to work Editor, commissioning   Now on fixed income from her SSI and pension   Social Drivers of Corporate investment banker Strain: Low Risk  (10/10/2021)   Overall Financial Resource Strain (CARDIA)    Difficulty of Paying Living Expenses: Not hard at all  Food Insecurity: No Food Insecurity (04/16/2023)   Hunger Vital Sign    Worried About Running Out of Food in the Last Year: Never true    Ran  Out of Food in the Last Year: Never true  Transportation Needs: No Transportation Needs (04/16/2023)   PRAPARE - Administrator, Civil Service (Medical): No    Lack of Transportation (Non-Medical): No  Physical Activity: Inactive (04/08/2023)   Received from [facility]   Exercise Vital Sign    Days of Exercise per Week: 0 days    Minutes of Exercise per Session: 0 min  Stress: No Stress Concern Present (10/10/2021)   [employer] of Occupational Health - Occupational Stress Questionnaire    Feeling of Stress : Only a little  Social Connections: Socially Integrated (10/10/2021)   Social Connection and Isolation Panel [NHANES]    Frequency of Communication with Friends and Family: More than three times a week    Frequency of Social Gatherings with Friends and Family: More than three times a week    Attends Religious Services: More than  4 times per year    Active Member of [employer] or Organizations: Yes    Attends Engineer, structural: More than 4 times per year    Marital Status: Married    Family History  Problem Relation Age of Onset   Cancer Mother    Diabetes Father    Heart Problems Father    COPD Father    Emphysema Father    Heart block Father    Congestive Heart Failure Father     Outpatient Encounter Medications as of 11/11/2023  Medication Sig   empagliflozin (JARDIANCE) 10 MG TABS tablet Take 10 mg by mouth daily.   Semaglutide,0.25 or 0.5MG /DOS, (OZEMPIC, 0.25 OR 0.5 MG/DOSE,) 2 MG/1.5ML SOPN Inject 0.25 mg into the skin once a week.   Accu-Chek Softclix Lancets lancets Use as instructed to test blood glucose four times daily   busPIRone (BUSPAR) 5 MG tablet Take 5 mg by mouth at bedtime.   Cholecalciferol (VITAMIN D3) 125 MCG (5000 UT) CAPS Take 1 capsule (5,000 Units total) by mouth daily.   citalopram (CELEXA) 20 MG tablet Take 1 tablet (20 mg total) by mouth every evening.   clopidogrel (PLAVIX) 75 MG tablet TAKE 1 TABLET BY MOUTH DAILY   Continuous Glucose Receiver (FREESTYLE LIBRE 2 READER) DEVI USE TO CHECK GLUCOSE AS DIRECTED   Continuous Glucose Sensor (FREESTYLE LIBRE 2 SENSOR) MISC CHANGE SENSOR EVERY 14 DAYS USE  TO CHECK BLOOD GLUCOSE  CONTINUOUSLY   donepezil (ARICEPT) 5 MG tablet Take 5 mg by mouth every evening.   estradiol (ESTRACE) 0.1 MG/GM vaginal cream Discard plastic applicator. Insert a blueberry size amount (approximately 1 gram) of cream on fingertip inside vagina at bedtime every night for 1 week then every other night. For long term use.   estradiol (ESTRACE) 2 MG tablet Take 1 mg by mouth daily.   fenofibrate 160 MG tablet Take 1 tablet (160 mg total) by mouth daily.   gabapentin (NEURONTIN) 100 MG capsule Take 1 capsule (100 mg total) by mouth at bedtime.   glucose blood test strip 1 each by Other route as needed. Use as instructed to test blood glucose four times daily    indapamide (LOZOL) 2.5 MG tablet TAKE 1 TABLET EVERY DAY   insulin degludec (TRESIBA FLEXTOUCH) 200 UNIT/ML FlexTouch Pen Inject 100 Units into the skin at bedtime.   insulin lispro (HUMALOG KWIKPEN) 100 UNIT/ML KwikPen Inject 14-20 Units into the skin 3 (three) times daily with meals.   Insulin Pen Needle (B-D ULTRAFINE III SHORT PEN) 31G X 8 MM MISC 1 each by  Does not apply route as directed.   isosorbide mononitrate (IMDUR) 60 MG 24 hr tablet Take 1.5 tablets (90 mg total) by mouth daily. Can take an additional half tab as needed for chest pain   levocetirizine (XYZAL) 5 MG tablet Take 5 mg by mouth every evening.   levothyroxine (SYNTHROID) 50 MCG tablet TAKE 1 TABLET BY MOUTH DAILY  BEFORE BREAKFAST   Magnesium Oxide (MAGNESIUM OXIDE 400) 240 MG PACK Take 400 mg by mouth daily.   metFORMIN (GLUCOPHAGE) 500 MG tablet TAKE 1 TABLET BY MOUTH DAILY  WITH BREAKFAST   modafinil (PROVIGIL) 100 MG tablet Take 1 tablet (100 mg total) by mouth daily.   montelukast (SINGULAIR) 10 MG tablet Take 10 mg by mouth at bedtime.   Multiple Vitamin (MULTIVITAMIN ADULT PO) Take 1 tablet by mouth daily.   nitroGLYCERIN (NITROSTAT) 0.4 MG SL tablet Place 1 tablet (0.4 mg total) under the tongue every 5 (five) minutes as needed for chest pain.   OVER THE COUNTER MEDICATION Take 2 capsules by mouth daily. Omega XL   pantoprazole (PROTONIX) 40 MG tablet TAKE 2 TABLETS EVERY DAY   rOPINIRole (REQUIP) 2 MG tablet Take 2 mg by mouth at bedtime.   rosuvastatin (CRESTOR) 40 MG tablet Take 1 tablet (40 mg total) by mouth daily.   Vibegron (GEMTESA) 75 MG TABS Take 1 tablet (75 mg total) by mouth daily.   warfarin (COUMADIN) 5 MG tablet TAKE 1/2 TO 1 TABLET BY MOUTH DAILY AS DIRECTED BY THE COUMADIN CLINIC. (Patient taking differently: Take 2.5-5 mg by mouth one time only at 4 PM. TAKE 1/2 TO 1 TABLET BY MOUTH DAILY AS DIRECTED BY THE COUMADIN CLINIC. Sunday, Tuesdays, Thursdays 1 tablet Mondays, Wednesdays, Fridays, and  Saturdays 1/2 tablet)   [DISCONTINUED] glipiZIDE (GLUCOTROL) 5 MG tablet Take 1 tablet (5 mg total) by mouth daily. (Patient not taking: Reported on 11/11/2023)   [DISCONTINUED] Vibegron (GEMTESA) 75 MG TABS Take 1 tablet (75 mg total) by mouth daily.   No facility-administered encounter medications on file as of 11/11/2023.    ALLERGIES: Allergies  Allergen Reactions   Azithromycin Hives   Codeine Nausea Only   Atomoxetine Nausea And Vomiting   Atorvastatin Other (See Comments)    Restless legs   Lisinopril Nausea And Vomiting    VACCINATION STATUS: Immunization History  Administered Date(s) Administered   Fluad Quad(high Dose 65+) 05/14/2021   Moderna SARS-COV2 Booster Vaccination 06/13/2020   Moderna Sars-Covid-2 Vaccination 09/06/2019, 10/04/2019   Pneumococcal Polysaccharide-23 04/22/2017   Pneumococcal-Unspecified 05/31/2020   Tdap 04/22/2017   Zoster Recombinant(Shingrix) 11/30/2017, 02/20/2018    Diabetes She presents for her follow-up diabetic visit. She has type 2 diabetes mellitus. Onset time: Patient was diagnosed at approximate age of 49 years. Her disease course has been worsening. There are no hypoglycemic associated symptoms. Pertinent negatives for hypoglycemia include no confusion, headaches, pallor or seizures. Pertinent negatives for diabetes include no blurred vision, no chest pain, no fatigue, no polydipsia, no polyphagia and no polyuria. There are no hypoglycemic complications. Symptoms are worsening. Diabetic complications include heart disease and peripheral neuropathy. Risk factors for coronary artery disease include dyslipidemia, diabetes mellitus, family history, obesity, post-menopausal, sedentary lifestyle, tobacco exposure and hypertension. Current diabetic treatment includes insulin injections and oral agent (dual therapy). Her weight is decreasing steadily. She is following a generally unhealthy diet. When asked about meal planning, she reported none. She  never participates in exercise. Her home blood glucose trend is increasing steadily. Her breakfast blood glucose range  is generally >200 mg/dl. Her lunch blood glucose range is generally >200 mg/dl. Her dinner blood glucose range is generally >200 mg/dl. Her bedtime blood glucose range is generally >200 mg/dl. Her overall blood glucose range is >200 mg/dl. (She presents with her CGM, showing persistently and significantly above target profile.  Her average blood glucose for the most recent 14 days is 310 mg per DL.  Her AGP report shows 4% time in range, 19% level 1 hyperglycemia, 77% able to hyperglycemia.  She has no hypoglycemia.  This is despite very large dose of insulin in basal/bolus regimen.  Her point-of-care A1c is 11%, largely unchanged from her previous measurements.      ) An ACE inhibitor/angiotensin II receptor blocker is not being taken. Eye exam is current.  Hyperlipidemia This is a chronic problem. The current episode started more than 1 year ago. The problem is uncontrolled. Exacerbating diseases include diabetes, hypothyroidism and obesity. Pertinent negatives include no chest pain, myalgias or shortness of breath. Current antihyperlipidemic treatment includes statins, fibric acid derivatives and bile acid sequestrants. Risk factors for coronary artery disease include family history, dyslipidemia, obesity, a sedentary lifestyle, post-menopausal, hypertension and diabetes mellitus.  Hypertension This is a chronic problem. The current episode started more than 1 year ago. Pertinent negatives include no blurred vision, chest pain, headaches, palpitations or shortness of breath. Risk factors for coronary artery disease include dyslipidemia, diabetes mellitus, family history, obesity, post-menopausal state, smoking/tobacco exposure and sedentary lifestyle. Past treatments include direct vasodilators. Hypertensive end-organ damage includes CAD/MI.    Review of systems  Constitutional: +  Minimally fluctuating body weight ,  current Body mass index is 38.69 kg/m. , no fatigue, no subjective hyperthermia, no subjective hypothermia Eyes: no blurry vision, no xerophthalmia    Objective:    BP 118/60   Pulse 60   Ht 5\' 4"  (1.626 m)   Wt 225 lb 6.4 oz (102.2 kg)   BMI 38.69 kg/m   Wt Readings from Last 3 Encounters:  11/11/23 225 lb 6.4 oz (102.2 kg)  09/17/23 227 lb 15.3 oz (103.4 kg)  09/06/23 228 lb (103.4 kg)    BP Readings from Last 3 Encounters:  11/11/23 118/60  09/17/23 (!) 131/54  09/06/23 112/64     Physical Exam- Limited  Constitutional:  Body mass index is 38.69 kg/m. , not in acute distress, normal state of mind Eyes:  EOMI, no exophthalmos    CMP ( most recent) CMP     Component Value Date/Time   NA 132 (L) 09/17/2023 1135   NA 138 07/07/2023 1429   K 4.0 09/17/2023 1135   CL 98 09/17/2023 1135   CO2 21 (L) 09/17/2023 1135   GLUCOSE 506 (HH) 09/17/2023 1135   BUN 22 09/17/2023 1135   BUN 17 07/07/2023 1429   CREATININE 1.03 (H) 09/17/2023 1135   CALCIUM 10.2 09/17/2023 1135   PROT 7.1 09/17/2023 1135   PROT 7.2 07/07/2023 1429   ALBUMIN 3.8 09/17/2023 1135   ALBUMIN 4.2 07/07/2023 1429   AST 34 09/17/2023 1135   ALT 32 09/17/2023 1135   ALKPHOS 68 09/17/2023 1135   BILITOT 0.5 09/17/2023 1135   BILITOT 0.4 07/07/2023 1429   GFRNONAA 59 (L) 09/17/2023 1135   GFRAA >90 07/09/2012 1736   Lipid Panel     Component Value Date/Time   CHOL 138 06/11/2023 1620   TRIG 474 (H) 06/11/2023 1620   HDL 25 (L) 06/11/2023 1620   CHOLHDL 5.5 (H) 06/11/2023 1620   LDLCALC  44 06/11/2023 1620   LABVLDL 69 (H) 06/11/2023 1620    Recent Results (from the past 2160 hours)  POCT INR     Status: Abnormal   Collection Time: 09/06/23  2:47 PM  Result Value Ref Range   INR 1.8 (A) 2.0 - 3.0   POC INR    POCT INR     Status: Normal   Collection Time: 09/15/23  2:55 PM  Result Value Ref Range   INR 2.0 2.0 - 3.0   POC INR    CBG monitoring,  ED     Status: Abnormal   Collection Time: 09/17/23 11:12 AM  Result Value Ref Range   Glucose-Capillary 434 (H) 70 - 99 mg/dL    Comment: Glucose reference range applies only to samples taken after fasting for at least 8 hours.  Beta-hydroxybutyric acid     Status: None   Collection Time: 09/17/23 11:30 AM  Result Value Ref Range   Beta-Hydroxybutyric Acid 0.27 0.05 - 0.27 mmol/L    Comment: Performed at [facility], [address], [place], [place] [address]  CBC with Differential     Status: Abnormal   Collection Time: 09/17/23 11:35 AM  Result Value Ref Range   WBC 11.4 (H) 4.0 - 10.5 K/uL   RBC 4.32 3.87 - 5.11 MIL/uL   Hemoglobin 13.3 12.0 - 15.0 g/dL   HCT 60.4 54.0 - 98.1 %   MCV 89.4 80.0 - 100.0 fL   MCH 30.8 26.0 - 34.0 pg   MCHC 34.5 30.0 - 36.0 g/dL   RDW 19.1 47.8 - 29.5 %   Platelets 285 150 - 400 K/uL   nRBC 0.0 0.0 - 0.2 %   Neutrophils Relative % 74 %   Neutro Abs 8.5 (H) 1.7 - 7.7 K/uL   Lymphocytes Relative 20 %   Lymphs Abs 2.3 0.7 - 4.0 K/uL   Monocytes Relative 5 %   Monocytes Absolute 0.6 0.1 - 1.0 K/uL   Eosinophils Relative 0 %   Eosinophils Absolute 0.0 0.0 - 0.5 K/uL   Basophils Relative 0 %   Basophils Absolute 0.0 0.0 - 0.1 K/uL   Immature Granulocytes 1 %   Abs Immature Granulocytes 0.06 0.00 - 0.07 K/uL    Comment: Performed at [facility], [address], [place], [place] [address]  Comprehensive metabolic panel     Status: Abnormal   Collection Time: 09/17/23 11:35 AM  Result Value Ref Range   Sodium 132 (L) 135 - 145 mmol/L   Potassium 4.0 3.5 - 5.1 mmol/L   Chloride 98 98 - 111 mmol/L   CO2 21 (L) 22 - 32 mmol/L   Glucose, Bld 506 (HH) 70 - 99 mg/dL    Comment: CRITICAL RESULT CALLED TO, READ BACK BY AND VERIFIED WITH EMILY G. ON 09/17/2023 @12 :02 BY T.HAMER Glucose reference range applies only to samples taken after fasting for at least 8 hours.    BUN 22 8 - 23 mg/dL   Creatinine, Ser 8.65 (H) 0.44 - 1.00 mg/dL   Calcium 78.4 8.9 -  69.6 mg/dL   Total Protein 7.1 6.5 - 8.1 g/dL   Albumin 3.8 3.5 - 5.0 g/dL   AST 34 15 - 41 U/L   ALT 32 0 - 44 U/L   Alkaline Phosphatase 68 38 - 126 U/L   Total Bilirubin 0.5 0.0 - 1.2 mg/dL   GFR, Estimated 59 (L) >60 mL/min    Comment: (NOTE) Calculated using the CKD-EPI Creatinine Equation (2021)  Anion gap 13 5 - 15    Comment: Performed at [facility], [address] [place], [place] [address]  Blood gas, venous (at [facility] and [facility])     Status: Abnormal   Collection Time: 09/17/23 11:53 AM  Result Value Ref Range   pH, Ven 7.42 7.25 - 7.43   pCO2, Ven 43 (L) 44 - 60 mmHg   pO2, Ven <31 (LL) 32 - 45 mmHg    Comment: CRITICAL RESULT CALLED TO, READ BACK BY AND VERIFIED WITH: EMILY G. ON 09/17/2023 @12 :00 BY T.HAMER    Bicarbonate 27.9 20.0 - 28.0 mmol/L   Acid-Base Excess 2.9 (H) 0.0 - 2.0 mmol/L   O2 Saturation 39.9 %   Patient temperature 36.8    Collection site RIGHT ANTECUBITAL    Drawn by [id number]     Comment: Performed at [facility], [address], [place], [place] [address]  Urinalysis, Routine w reflex microscopic -Urine, Clean Catch     Status: Abnormal   Collection Time: 09/17/23 12:17 PM  Result Value Ref Range   Color, Urine STRAW (A) YELLOW   APPearance CLEAR CLEAR   Specific Gravity, Urine 1.026 1.005 - 1.030   pH 6.0 5.0 - 8.0   Glucose, UA >=500 (A) NEGATIVE mg/dL   Hgb urine dipstick MODERATE (A) NEGATIVE   Bilirubin Urine NEGATIVE NEGATIVE   Ketones, ur NEGATIVE NEGATIVE mg/dL   Protein, ur NEGATIVE NEGATIVE mg/dL   Nitrite NEGATIVE NEGATIVE   Leukocytes,Ua NEGATIVE NEGATIVE   RBC / HPF 0-5 0 - 5 RBC/hpf   WBC, UA 0-5 0 - 5 WBC/hpf   Bacteria, UA NONE SEEN NONE SEEN   Squamous Epithelial / HPF 0-5 0 - 5 /HPF   Mucus PRESENT     Comment: Performed at [facility], [address], [place], [place] [address]  POC CBG, ED     Status: Abnormal   Collection Time: 09/17/23  2:06 PM  Result Value Ref Range   Glucose-Capillary 421 (H) 70 - 99 mg/dL     Comment: Glucose reference range applies only to samples taken after fasting for at least 8 hours.  POC CBG, ED     Status: Abnormal   Collection Time: 09/17/23  2:59 PM  Result Value Ref Range   Glucose-Capillary 384 (H) 70 - 99 mg/dL    Comment: Glucose reference range applies only to samples taken after fasting for at least 8 hours.  CBG monitoring, ED     Status: Abnormal   Collection Time: 09/17/23  4:12 PM  Result Value Ref Range   Glucose-Capillary 368 (H) 70 - 99 mg/dL    Comment: Glucose reference range applies only to samples taken after fasting for at least 8 hours.  CBG monitoring, ED     Status: Abnormal   Collection Time: 09/17/23  4:49 PM  Result Value Ref Range   Glucose-Capillary 367 (H) 70 - 99 mg/dL    Comment: Glucose reference range applies only to samples taken after fasting for at least 8 hours.  POCT INR     Status: Abnormal   Collection Time: 10/07/23  2:22 PM  Result Value Ref Range   INR 1.9 (A) 2.0 - 3.0   POC INR    Lab report - scanned     Status: None   Collection Time: 10/18/23 10:09 AM  Result Value Ref Range   EGFR 67.7     Comment: ABS BY HIM   A1c 11   POCT INR  Status: Normal   Collection Time: 11/03/23  1:58 PM  Result Value Ref Range   INR 2.7 2.0 - 3.0   POC INR       Assessment & Plan:   1) DM type 2 causing vascular disease (HCC)  - Rachel Odonnell has currently uncontrolled symptomatic type 2 DM since  67 years of age.  She presents with her CGM, showing persistently and significantly above target profile.  Her average blood glucose for the most recent 14 days is 310 mg per DL.  Her AGP report shows 4% time in range, 19% level 1 hyperglycemia, 77% able to hyperglycemia.  She has no hypoglycemia.  This is despite very large dose of insulin in basal/bolus regimen.  Her point-of-care A1c is 11%, largely unchanged from her previous measurements.     Recent labs reviewed.  - I had a long discussion with her about the progressive  nature of diabetes and the pathology behind its complications. -her diabetes is complicated by coronary artery disease, peripheral neuropathy, obesity, sedentary life, nonalcoholic fatty liver disease and she remains at extremely  high risk for more acute and chronic complications which include CAD, CVA, CKD, retinopathy, and neuropathy. These are all discussed in detail with her.  - I discussed all available options of managing her diabetes including de-escalation of medications.  Considering her associated comorbidities including sleep apnea requiring CPAP, nonalcoholic fatty liver disease, hypertension, hyperlipidemia, she is a perfect candidate for lifestyle medicine.    I have counseled her on diet  and weight management  by adopting a Whole Food , Plant Predominant  ( WFPP) nutrition as recommended by [employer] of Lifestyle Medicine. Patient is encouraged to switch to  unprocessed or minimally processed  complex starch, adequate protein intake (mainly plant source), minimal liquid fat ( mainly vegetable oils), plenty of fruits, and vegetables. -  she is advised to stick to a routine mealtimes to eat 3 complete meals a day and snack only when necessary ( to snack only to correct hypoglycemia BG <70 day time or <100 at night).   -Her engagement with lifestyle medicine nutrition is suboptimal, however,  she acknowledges that there is a room for improvement in her food and drink choices.  - she acknowledges that there is a room for improvement in her food and drink choices. - Suggestion is made for her to avoid simple carbohydrates  from her diet including Cakes, Sweet Desserts, Ice Cream, Soda (diet and regular), Sweet Tea, Candies, Chips, Cookies, Store Bought Juices, Alcohol , Artificial Sweeteners,  Coffee Creamer, and "Sugar-free" Products, Lemonade. This will help patient to have more stable blood glucose profile and potentially avoid unintended weight gain.  The following Lifestyle  Medicine recommendations according to American College of Lifestyle Medicine  [facility]) were discussed and and offered to patient and she  agrees to start the journey:  A. Whole Foods, Plant-Based Nutrition comprising of fruits and vegetables, plant-based proteins, whole-grain carbohydrates was discussed in detail with the patient.   A list for source of those nutrients were also provided to the patient.  Patient will use only water or unsweetened tea for hydration. B.  The need to stay away from risky substances including alcohol, smoking; obtaining 7 to 9 hours of restorative sleep, at least 150 minutes of moderate intensity exercise weekly, the importance of healthy social connections,  and stress management techniques were discussed. C.  A full color page of  Calorie density of various food groups per pound  showing examples of each food groups was provided to the patient.    - she has been scheduled with Norm Salt, RDN, CDE for individualized diabetes education.  - I have approached her with the following plan to manage  her diabetes and patient agrees:   -She is accompanied by her husband to clinic today.  I had a long discussion with the family about her uncontrolled diabetes posing a very serious risk for acute and chronic complications of diabetes including cardiovascular disease.   -Insulin injection techniques were revised and confirmed with her.  She is advised to rotate her injection sites on preferably abdominal skin.    - She is advised to lower her Tresiba to 100 units nightly, continue NovoLog 14-20 units 3 times daily is for Premeal blood glucose readings above 90 mg per DL.  She is advised to continue metformin 500 mg p.o. daily. -She has received a month supply of Jardiance, advised to continue Jardiance 10 mg p.o. daily at breakfast.  She did not afford the co-pays of GLP-1 super agonist.  I gave her sample of Ozempic 0.25 mg subcutaneously weekly, to advance as  tolerated. She is encouraged to call clinic for hypoglycemia under 70 or hyperglycemia above 200 mg per DL of weekly average.  - Specific targets for  A1c;  LDL, HDL,  and Triglycerides were discussed with the patient.  2) Blood Pressure /Hypertension:   Her blood pressure is controlled to target.  she is advised to continue her current medications including Imdur 60 mg p.o. daily with breakfast .  3) Lipids/Hyperlipidemia:    Her most recent lipid panel showed improved LDL to 44 from 120.  She is advised to continue  on her current triple agent therapy including fenofibrate 145 mg p.o. daily, 35 acids 1000 mg p.o. twice daily, Crestor 10 mg p.o. daily at bedtime.   She has a chance to simplify this treatment with proper engagement in lifestyle medicine.  She is hesitant to engage with lifestyle medicine for now.  Side effects and precautions discussed with her.  4)  Weight/Diet:   Her Body mass index is 38.69 kg/m.  -   clearly complicating her diabetes care.   she is  a candidate for weight loss. I discussed with her the fact that loss of 5 - 10% of her  current body weight will have the most impact on her diabetes management.  The above detailed  ACLM recommendations for nutrition, exercise, sleep, social life, avoidance of risky substances, the need for restorative sleep   information will also detailed on discharge instructions.  5) Hypothyroidism:  The circumstances of her diagnosis are not available to review.  Her recent labs were consistent with appropriate replacement.  She is advised to continue levothyroxine 50 mcg p.o. daily before breakfast.     - We discussed about the correct intake of her thyroid hormone, on empty stomach at fasting, with water, separated by at least 30 minutes from breakfast and other medications,  and separated by more than 4 hours from calcium, iron, multivitamins, acid reflux medications (PPIs). -Patient is made aware of the fact that thyroid hormone  replacement is needed for life, dose to be adjusted by periodic monitoring of thyroid function tests.   6) Chronic Care/Health Maintenance: -she is on Statin medications and  is encouraged to initiate and continue to follow up with Ophthalmology, Dentist,  Podiatrist at least yearly or according to recommendations, and advised to   stay away from smoking. I  have recommended yearly flu vaccine and pneumonia vaccine at least every 5 years; moderate intensity exercise for up to 150 minutes weekly; and  sleep for 7- 9 hours a day.  -She will need to have vitamin D supplement with vitamin D3 5000 units daily x90 days.  - she is advised to maintain close follow up with Royann Shivers, PA-C for primary care needs, as well as her other providers for optimal and coordinated care.  I spent  41  minutes in the care of the patient today including review of labs from CMP, Lipids, Thyroid Function, Hematology (current and previous including abstractions from other facilities); face-to-face time discussing  her blood glucose readings/logs, discussing hypoglycemia and hyperglycemia episodes and symptoms, medications doses, her options of short and long term treatment based on the latest standards of care / guidelines;  discussion about incorporating lifestyle medicine;  and documenting the encounter. Risk reduction counseling performed per USPSTF guidelines to reduce  obesity and cardiovascular risk factors.     Please refer to Patient Instructions for Blood Glucose Monitoring and Insulin/Medications Dosing Guide"  in media tab for additional information. Please  also refer to " Patient Self Inventory" in the Media  tab for reviewed elements of pertinent patient history.  Rachel Odonnell participated in the discussions, expressed understanding, and voiced agreement with the above plans.  All questions were answered to her satisfaction. she is encouraged to contact clinic should she have any questions or concerns  prior to her return visit.      Follow up plan: - Return in about 6 weeks (around 12/23/2023) for F/U with Meter/CGM Megan Salon Only - no Labs.  Ronny Bacon, [facility] [address] [place], [place] [address] Phone: [phone] Fax: [phone]  11/11/2023, 6:26 PM

## 2023-11-11 NOTE — Patient Instructions (Signed)

## 2023-11-17 ENCOUNTER — Other Ambulatory Visit: Payer: Self-pay | Admitting: "Endocrinology

## 2023-11-17 DIAGNOSIS — E1159 Type 2 diabetes mellitus with other circulatory complications: Secondary | ICD-10-CM

## 2023-11-24 ENCOUNTER — Telehealth: Payer: Self-pay | Admitting: Urology

## 2023-11-24 ENCOUNTER — Other Ambulatory Visit (HOSPITAL_BASED_OUTPATIENT_CLINIC_OR_DEPARTMENT_OTHER): Payer: Self-pay | Admitting: Pulmonary Disease

## 2023-11-24 NOTE — Telephone Encounter (Signed)
 Patient is requesting samples for [facility] , she would like her husband to pick them up today when he comes for his appt at 1pm

## 2023-11-24 NOTE — Telephone Encounter (Unsigned)
 Copied from CRM [id number]. Topic: Clinical - Medication Refill >> Nov 24, 2023 11:47 AM Brennan Bailey S wrote: Most Recent Primary Care Visit:   Medication: modafinil (PROVIGIL) 100 MG tablet, 90 day fill only, because that is the only way pharmacy will fill prescription.  Has the patient contacted their pharmacy? Yes (Agent: If no, request that the patient contact the pharmacy for the refill. If patient does not wish to contact the pharmacy document the reason why and proceed with request.) (Agent: If yes, when and what did the pharmacy advise?)  Is this the correct pharmacy for this prescription? Yes If no, delete pharmacy and type the correct one.  This is the patient's preferred pharmacy:  [facility] [place] - [address] [place]  [address] Phone: [phone] Fax: [phone]   Has the prescription been filled recently? No  Is the patient out of the medication? No  Has the patient been seen for an appointment in the last year OR does the patient have an upcoming appointment? Yes  Can we respond through MyChart? Yes  Agent: Please be advised that Rx refills may take up to 3 business days. We ask that you follow-up with your pharmacy.

## 2023-11-24 NOTE — Telephone Encounter (Signed)
Samples of Gemtesa given to patient.

## 2023-11-25 ENCOUNTER — Telehealth: Payer: Self-pay

## 2023-11-25 MED ORDER — MODAFINIL 100 MG PO TABS: 100.0000 mg | ORAL_TABLET | Freq: Every day | ORAL | 0 refills | Status: DC

## 2023-11-25 NOTE — Telephone Encounter (Signed)
 Pt called stating she has been taking Ozempic 0.25mg  weekly x 2 and thought she was supposed to increase to 0.5mg  weekly so she injected the 0.5mg  this morning. States afterwards she realized she was supposed to continue the 0.25mg  for 2 more weeks.

## 2023-11-25 NOTE — Telephone Encounter (Signed)
 That's ok.  She can move up to the 0.5 mg as long as she was tolerating the 0.25 ok.

## 2023-11-26 NOTE — Telephone Encounter (Signed)
 Spoke with pt advising her as long as she tolerated the 0.25mg  dose it is ok to move up to the 0.5mg  dose per Ronny Bacon, FNP. Pt voiced understanding and stated she had tolerated the 0.25mg  dose well.

## 2023-12-01 ENCOUNTER — Encounter

## 2023-12-02 ENCOUNTER — Encounter: Attending: Cardiology | Admitting: *Deleted

## 2023-12-02 ENCOUNTER — Other Ambulatory Visit (HOSPITAL_COMMUNITY): Payer: Self-pay

## 2023-12-02 ENCOUNTER — Telehealth: Payer: Self-pay

## 2023-12-02 DIAGNOSIS — I4821 Permanent atrial fibrillation: Secondary | ICD-10-CM | POA: Diagnosis not present

## 2023-12-02 DIAGNOSIS — Z5181 Encounter for therapeutic drug level monitoring: Secondary | ICD-10-CM | POA: Insufficient documentation

## 2023-12-02 LAB — POCT INR: INR: 2.2 (ref 2.0–3.0)

## 2023-12-02 NOTE — Telephone Encounter (Signed)
 Now approved through 06/02/2024

## 2023-12-02 NOTE — Telephone Encounter (Signed)
 AHI seems to be as high as 10-15/hour when looking at her note from 10/2022

## 2023-12-02 NOTE — Telephone Encounter (Signed)
 Additional information added to request-pending determination.

## 2023-12-02 NOTE — Telephone Encounter (Signed)
*  Pulm  Pharmacy Patient Advocate Encounter   Received notification from CoverMyMeds that prior authorization for Modafinil 100MG  tablets  is required/requested.   Insurance verification completed.   The patient is insured through [facility] .   Per test claim: PA required; PA started via CoverMyMeds. KEY [id number] . Please see clinical question(s) below that I am not finding the answer to in her chart and advise.   How many obstructive respiratory events per hour of sleep does the patient have?

## 2023-12-02 NOTE — Patient Instructions (Signed)
 Continue warfarin 1/2 tablet daily except 1 tablet on Sundays, Tuesdays and Thursdays  Recheck INR in 4 week

## 2023-12-15 ENCOUNTER — Telehealth: Payer: Self-pay | Admitting: Urology

## 2023-12-15 DIAGNOSIS — E782 Mixed hyperlipidemia: Secondary | ICD-10-CM | POA: Diagnosis not present

## 2023-12-15 DIAGNOSIS — E1165 Type 2 diabetes mellitus with hyperglycemia: Secondary | ICD-10-CM | POA: Diagnosis not present

## 2023-12-15 DIAGNOSIS — N3281 Overactive bladder: Secondary | ICD-10-CM

## 2023-12-15 MED ORDER — GEMTESA 75 MG PO TABS
75.0000 mg | ORAL_TABLET | Freq: Every day | ORAL | Status: DC
Start: 2023-12-15 — End: 2024-05-02

## 2023-12-15 NOTE — Telephone Encounter (Signed)
 Sample up front Gemtesa 

## 2023-12-15 NOTE — Telephone Encounter (Signed)
 Needs Gemtesa  samples She is here with her husband

## 2023-12-23 ENCOUNTER — Encounter: Payer: Self-pay | Admitting: "Endocrinology

## 2023-12-23 ENCOUNTER — Ambulatory Visit (INDEPENDENT_AMBULATORY_CARE_PROVIDER_SITE_OTHER): Admitting: "Endocrinology

## 2023-12-23 VITALS — BP 128/52 | HR 64 | Ht 64.0 in | Wt 221.0 lb

## 2023-12-23 DIAGNOSIS — I10 Essential (primary) hypertension: Secondary | ICD-10-CM | POA: Diagnosis not present

## 2023-12-23 DIAGNOSIS — K760 Fatty (change of) liver, not elsewhere classified: Secondary | ICD-10-CM

## 2023-12-23 DIAGNOSIS — E1159 Type 2 diabetes mellitus with other circulatory complications: Secondary | ICD-10-CM

## 2023-12-23 DIAGNOSIS — E559 Vitamin D deficiency, unspecified: Secondary | ICD-10-CM

## 2023-12-23 DIAGNOSIS — Z794 Long term (current) use of insulin: Secondary | ICD-10-CM

## 2023-12-23 DIAGNOSIS — E782 Mixed hyperlipidemia: Secondary | ICD-10-CM

## 2023-12-23 DIAGNOSIS — E039 Hypothyroidism, unspecified: Secondary | ICD-10-CM

## 2023-12-23 LAB — POCT UA - MICROALBUMIN
Albumin/Creatinine Ratio, Urine, POC: <30
Creatinine, POC: 200 mg/dL
Microalbumin Ur, POC: 30 mg/L

## 2023-12-23 MED ORDER — TRESIBA FLEXTOUCH 200 UNIT/ML ~~LOC~~ SOPN: 110.0000 [IU] | PEN_INJECTOR | Freq: Every day | SUBCUTANEOUS | 2 refills | Status: DC

## 2023-12-23 NOTE — Patient Instructions (Signed)

## 2023-12-23 NOTE — Progress Notes (Signed)
 12/23/2023, 3:28 PM   Endocrinology follow-up note  Subjective:    Patient ID: Rachel Odonnell, female    DOB: [birth year].  Rachel Odonnell is being seen in follow-up after she was seen in consultation for  management of currently uncontrolled, symptomatic type 2 diabetes requested by  Skillman, Katherine E, PA-C.   Past Medical History:  Diagnosis Date   ADD (attention deficit disorder)    Diabetes mellitus without complication (HCC)    Hypertension     Past Surgical History:  Procedure Laterality Date   ABDOMINAL HYSTERECTOMY     COLONOSCOPY  02/2022   CORONARY PRESSURE/FFR STUDY N/A 04/16/2023   Procedure: CORONARY PRESSURE/FFR STUDY;  Surgeon: Swaziland, Peter M, MD;  Location: [facility] INVASIVE CV LAB;  Service: Cardiovascular;  Laterality: N/A;   CORONARY STENT INTERVENTION N/A 04/16/2023   Procedure: CORONARY STENT INTERVENTION;  Surgeon: Swaziland, Peter M, MD;  Location: [facility] INVASIVE CV LAB;  Service: Cardiovascular;  Laterality: N/A;   CORONARY ULTRASOUND/IVUS N/A 04/16/2023   Procedure: Coronary Ultrasound/IVUS;  Surgeon: Swaziland, Peter M, MD;  Location: [facility] INVASIVE CV LAB;  Service: Cardiovascular;  Laterality: N/A;   EXCISIONAL HEMORRHOIDECTOMY     INCONTINENCE SURGERY     RIGHT/LEFT HEART CATH AND CORONARY ANGIOGRAPHY N/A 06/13/2021   Procedure: RIGHT/LEFT HEART CATH AND CORONARY ANGIOGRAPHY;  Surgeon: Swaziland, Peter M, MD;  Location: [facility] INVASIVE CV LAB;  Service: Cardiovascular;  Laterality: N/A;   RIGHT/LEFT HEART CATH AND CORONARY ANGIOGRAPHY N/A 04/16/2023   Procedure: RIGHT/LEFT HEART CATH AND CORONARY ANGIOGRAPHY;  Surgeon: Swaziland, Peter M, MD;  Location: [facility] INVASIVE CV LAB;  Service: Cardiovascular;  Laterality: N/A;    Social History   Socioeconomic History   Marital status: Married    Spouse name: Gaetana Jones   Number of children: Not on file   Years of education: Not on file   Highest education  level: Not on file  Occupational History   Not on file  Tobacco Use   Smoking status: Former    Current packs/day: 0.00    Types: Cigarettes    Quit date: 08/17/1994    Years since quitting: 29.3   Smokeless tobacco: Never  Vaping Use   Vaping status: Never Used  Substance and Sexual Activity   Alcohol  use: No   Drug use: No   Sexual activity: Yes  Other Topics Concern   Not on file  Social History Narrative   Lives at home with husband, Gaetana Jones   Former smoker Smoked 2 ppd x 25 yrs; Quit in 1996   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, retail  retired Secondary school teacher, still works part-time at the [employer], used to work Editor, commissioning   Now on fixed income from her SSI and pension   Social Drivers of Corporate investment banker Strain: Low Risk  (10/10/2021)   Overall Financial Resource Strain (CARDIA)    Difficulty of Paying Living Expenses: Not hard at all  Food Insecurity: No Food Insecurity (04/16/2023)   Hunger Vital Sign    Worried About Running Out of Food in the Last Year: Never true    Ran  Out of Food in the Last Year: Never true  Transportation Needs: No Transportation Needs (04/16/2023)   PRAPARE - Administrator, Civil Service (Medical): No    Lack of Transportation (Non-Medical): No  Physical Activity: Inactive (04/08/2023)   Received from [facility]   Exercise Vital Sign    Days of Exercise per Week: 0 days    Minutes of Exercise per Session: 0 min  Stress: No Stress Concern Present (10/10/2021)   [employer] of Occupational Health - Occupational Stress Questionnaire    Feeling of Stress : Only a little  Social Connections: Socially Integrated (10/10/2021)   Social Connection and Isolation Panel [NHANES]    Frequency of Communication with Friends and Family: More than three times a week    Frequency of Social Gatherings with Friends and Family: More than three times a week    Attends Religious Services: More than  4 times per year    Active Member of [employer] or Organizations: Yes    Attends Engineer, structural: More than 4 times per year    Marital Status: Married    Family History  Problem Relation Age of Onset   Cancer Mother    Diabetes Father    Heart Problems Father    COPD Father    Emphysema Father    Heart block Father    Congestive Heart Failure Father     Outpatient Encounter Medications as of 12/23/2023  Medication Sig   Accu-Chek Softclix Lancets lancets Use as instructed to test blood glucose four times daily   busPIRone  (BUSPAR ) 5 MG tablet Take 5 mg by mouth at bedtime.   Cholecalciferol (VITAMIN D3) 125 MCG (5000 UT) CAPS Take 1 capsule (5,000 Units total) by mouth daily.   citalopram  (CELEXA ) 20 MG tablet Take 1 tablet (20 mg total) by mouth every evening.   clopidogrel  (PLAVIX ) 75 MG tablet TAKE 1 TABLET BY MOUTH DAILY   Continuous Glucose Receiver (FREESTYLE LIBRE 2 READER) DEVI USE TO CHECK GLUCOSE AS DIRECTED   Continuous Glucose Sensor (FREESTYLE LIBRE 2 SENSOR) MISC CHANGE SENSOR EVERY 14 DAYS USE  TO CHECK BLOOD GLUCOSE  CONTINUOUSLY   donepezil  (ARICEPT ) 5 MG tablet Take 5 mg by mouth every evening.   empagliflozin  (JARDIANCE ) 10 MG TABS tablet Take 10 mg by mouth daily.   estradiol  (ESTRACE ) 0.1 MG/GM vaginal cream Discard plastic applicator. Insert a blueberry size amount (approximately 1 gram) of cream on fingertip inside vagina at bedtime every night for 1 week then every other night. For long term use.   estradiol  (ESTRACE ) 2 MG tablet Take 1 mg by mouth daily.   fenofibrate  160 MG tablet Take 1 tablet (160 mg total) by mouth daily.   gabapentin  (NEURONTIN ) 100 MG capsule Take 1 capsule (100 mg total) by mouth at bedtime.   glucose blood test strip 1 each by Other route as needed. Use as instructed to test blood glucose four times daily   indapamide  (LOZOL ) 2.5 MG tablet TAKE 1 TABLET EVERY DAY   insulin  degludec (TRESIBA  FLEXTOUCH) 200 UNIT/ML FlexTouch  Pen Inject 110 Units into the skin at bedtime.   insulin  lispro (HUMALOG  KWIKPEN) 100 UNIT/ML KwikPen INJECT 14 TO 20 UNITS INTO THE  SKIN 3 TIMES DAILY WITH MEALS   Insulin  Pen Needle (B-D ULTRAFINE III SHORT PEN) 31G X 8 MM MISC 1 each by Does not apply route as directed.   isosorbide  mononitrate (IMDUR ) 60 MG 24 hr tablet Take 1.5 tablets (90  mg total) by mouth daily. Can take an additional half tab as needed for chest pain   levocetirizine (XYZAL) 5 MG tablet Take 5 mg by mouth every evening.   levothyroxine  (SYNTHROID ) 50 MCG tablet TAKE 1 TABLET BY MOUTH DAILY  BEFORE BREAKFAST   Magnesium  Oxide (MAGNESIUM  OXIDE 400) 240 MG PACK Take 400 mg by mouth daily.   metFORMIN  (GLUCOPHAGE ) 500 MG tablet TAKE 1 TABLET BY MOUTH DAILY  WITH BREAKFAST   modafinil  (PROVIGIL ) 100 MG tablet Take 1 tablet (100 mg total) by mouth daily.   montelukast  (SINGULAIR ) 10 MG tablet Take 10 mg by mouth at bedtime.   Multiple Vitamin (MULTIVITAMIN ADULT PO) Take 1 tablet by mouth daily.   nitroGLYCERIN  (NITROSTAT ) 0.4 MG SL tablet Place 1 tablet (0.4 mg total) under the tongue every 5 (five) minutes as needed for chest pain.   OVER THE COUNTER MEDICATION Take 2 capsules by mouth daily. Omega XL   pantoprazole  (PROTONIX ) 40 MG tablet TAKE 2 TABLETS EVERY DAY   rOPINIRole (REQUIP) 2 MG tablet Take 2 mg by mouth at bedtime.   rosuvastatin  (CRESTOR ) 40 MG tablet Take 1 tablet (40 mg total) by mouth daily.   Semaglutide ,0.25 or 0.5MG /DOS, (OZEMPIC , 0.25 OR 0.5 MG/DOSE,) 2 MG/1.5ML SOPN Inject 0.5 mg into the skin once a week.   Vibegron  (GEMTESA ) 75 MG TABS Take 1 tablet (75 mg total) by mouth daily.   Vibegron  (GEMTESA ) 75 MG TABS Take 1 tablet (75 mg total) by mouth daily.   warfarin (COUMADIN ) 5 MG tablet TAKE 1/2 TO 1 TABLET BY MOUTH DAILY AS DIRECTED BY THE COUMADIN  CLINIC. (Patient taking differently: Take 2.5-5 mg by mouth one time only at 4 PM. TAKE 1/2 TO 1 TABLET BY MOUTH DAILY AS DIRECTED BY THE COUMADIN   CLINIC. Sunday, Tuesdays, Thursdays 1 tablet Mondays, Wednesdays, Fridays, and Saturdays 1/2 tablet)   [DISCONTINUED] insulin  degludec (TRESIBA  FLEXTOUCH) 200 UNIT/ML FlexTouch Pen Inject 100 Units into the skin at bedtime.   No facility-administered encounter medications on file as of 12/23/2023.    ALLERGIES: Allergies  Allergen Reactions   Azithromycin Hives   Codeine Nausea Only   Atomoxetine Nausea And Vomiting   Atorvastatin Other (See Comments)    Restless legs   Lisinopril Nausea And Vomiting    VACCINATION STATUS: Immunization History  Administered Date(s) Administered   Fluad Quad(high Dose 65+) 05/14/2021   Moderna SARS-COV2 Booster Vaccination 06/13/2020   Moderna Sars-Covid-2 Vaccination 09/06/2019, 10/04/2019   Pneumococcal Polysaccharide-23 04/22/2017   Pneumococcal-Unspecified 05/31/2020   Tdap 04/22/2017   Zoster Recombinant(Shingrix) 11/30/2017, 02/20/2018    Diabetes She presents for her follow-up diabetic visit. She has type 2 diabetes mellitus. Onset time: Patient was diagnosed at approximate age of 49 years. Her disease course has been improving. There are no hypoglycemic associated symptoms. Pertinent negatives for hypoglycemia include no confusion, headaches, pallor or seizures. Pertinent negatives for diabetes include no blurred vision, no chest pain, no fatigue, no polydipsia, no polyphagia and no polyuria. There are no hypoglycemic complications. Symptoms are improving. Diabetic complications include heart disease and peripheral neuropathy. Risk factors for coronary artery disease include dyslipidemia, diabetes mellitus, family history, obesity, post-menopausal, sedentary lifestyle, tobacco exposure and hypertension. Current diabetic treatment includes insulin  injections and oral agent (dual therapy). Her weight is decreasing steadily. She is following a generally unhealthy diet. When asked about meal planning, she reported none. She never participates in  exercise. Her home blood glucose trend is decreasing steadily. Her breakfast blood glucose range is generally 180-200  mg/dl. Her lunch blood glucose range is generally 180-200 mg/dl. Her dinner blood glucose range is generally 180-200 mg/dl. Her bedtime blood glucose range is generally 180-200 mg/dl. Her overall blood glucose range is 180-200 mg/dl. (She presents with her CGM, showing improved glycemic profile with no hypoglycemia.  She has 40% time range, 54% level 1 hyperglycemia, 6% level 2 hyperglycemia.  Her average glucose 193 for the last 14 days.  Her estimated A1c is  (7.9%) compared to 11% in March 2025 .     ) An ACE inhibitor/angiotensin II receptor blocker is not being taken. Eye exam is current.  Hyperlipidemia This is a chronic problem. The current episode started more than 1 year ago. The problem is uncontrolled. Exacerbating diseases include diabetes, hypothyroidism and obesity. Pertinent negatives include no chest pain, myalgias or shortness of breath. Current antihyperlipidemic treatment includes statins, fibric acid derivatives and bile acid sequestrants. Risk factors for coronary artery disease include family history, dyslipidemia, obesity, a sedentary lifestyle, post-menopausal, hypertension and diabetes mellitus.  Hypertension This is a chronic problem. The current episode started more than 1 year ago. Pertinent negatives include no blurred vision, chest pain, headaches, palpitations or shortness of breath. Risk factors for coronary artery disease include dyslipidemia, diabetes mellitus, family history, obesity, post-menopausal state, smoking/tobacco exposure and sedentary lifestyle. Past treatments include direct vasodilators. Hypertensive end-organ damage includes CAD/MI.    Review of systems  Constitutional: + Minimally fluctuating body weight ,  current Body mass index is 37.93 kg/m. , no fatigue, no subjective hyperthermia, no subjective hypothermia Eyes: no blurry vision,  no xerophthalmia    Objective:    BP (!) 128/52   Pulse 64   Ht 5\' 4"  (1.626 m)   Wt 221 lb (100.2 kg)   BMI 37.93 kg/m   Wt Readings from Last 3 Encounters:  12/23/23 221 lb (100.2 kg)  11/11/23 225 lb 6.4 oz (102.2 kg)  09/17/23 227 lb 15.3 oz (103.4 kg)    BP Readings from Last 3 Encounters:  12/23/23 (!) 128/52  11/11/23 118/60  09/17/23 (!) 131/54     Physical Exam- Limited  Constitutional:  Body mass index is 37.93 kg/m. , not in acute distress, normal state of mind Eyes:  EOMI, no exophthalmos    CMP ( most recent) CMP     Component Value Date/Time   NA 132 (L) 09/17/2023 1135   NA 138 07/07/2023 1429   K 4.0 09/17/2023 1135   CL 98 09/17/2023 1135   CO2 21 (L) 09/17/2023 1135   GLUCOSE 506 (HH) 09/17/2023 1135   BUN 22 09/17/2023 1135   BUN 17 07/07/2023 1429   CREATININE 1.03 (H) 09/17/2023 1135   CALCIUM  10.2 09/17/2023 1135   PROT 7.1 09/17/2023 1135   PROT 7.2 07/07/2023 1429   ALBUMIN 3.8 09/17/2023 1135   ALBUMIN 4.2 07/07/2023 1429   AST 34 09/17/2023 1135   ALT 32 09/17/2023 1135   ALKPHOS 68 09/17/2023 1135   BILITOT 0.5 09/17/2023 1135   BILITOT 0.4 07/07/2023 1429   GFRNONAA 59 (L) 09/17/2023 1135   GFRAA >90 07/09/2012 1736   Lipid Panel     Component Value Date/Time   CHOL 138 06/11/2023 1620   TRIG 474 (H) 06/11/2023 1620   HDL 25 (L) 06/11/2023 1620   CHOLHDL 5.5 (H) 06/11/2023 1620   LDLCALC 44 06/11/2023 1620   LABVLDL 69 (H) 06/11/2023 1620    Recent Results (from the past 2160 hours)  POCT INR     Status:  Abnormal   Collection Time: 10/07/23  2:22 PM  Result Value Ref Range   INR 1.9 (A) 2.0 - 3.0   POC INR    Lab report - scanned     Status: None   Collection Time: 10/18/23 10:09 AM  Result Value Ref Range   EGFR 67.7     Comment: ABS BY HIM   A1c 11   POCT INR     Status: Normal   Collection Time: 11/03/23  1:58 PM  Result Value Ref Range   INR 2.7 2.0 - 3.0   POC INR    POCT INR     Status: Normal    Collection Time: 12/02/23  2:19 PM  Result Value Ref Range   INR 2.2 2.0 - 3.0   POC INR    POCT UA - Microalbumin     Status: None   Collection Time: 12/23/23  2:49 PM  Result Value Ref Range   Microalbumin Ur, POC 30 mg/L   Creatinine, POC 200 mg/dL   Albumin/Creatinine Ratio, Urine, POC <30      Assessment & Plan:   1) DM type 2 causing vascular disease (HCC)  - Rachel Odonnell has currently uncontrolled symptomatic type 2 DM since  68 years of age.  She presents with her CGM, showing improved glycemic profile with no hypoglycemia.  She has 40% time range, 54% level 1 hyperglycemia, 6% level 2 hyperglycemia.  Her average glucose 193 for the last 14 days.  Her estimated A1c is  (7.9%) compared to 11% in March 2025 .   Recent labs reviewed.  - I had a long discussion with her about the progressive nature of diabetes and the pathology behind its complications. -her diabetes is complicated by coronary artery disease, peripheral neuropathy, obesity, sedentary life, nonalcoholic fatty liver disease and she remains at extremely  high risk for more acute and chronic complications which include CAD, CVA, CKD, retinopathy, and neuropathy. These are all discussed in detail with her.  - I discussed all available options of managing her diabetes including de-escalation of medications.  Considering her associated comorbidities including sleep apnea requiring CPAP, nonalcoholic fatty liver disease, hypertension, hyperlipidemia, she is a perfect candidate for lifestyle medicine.    I have counseled her on diet  and weight management  by adopting a Whole Food , Plant Predominant  ( WFPP) nutrition as recommended by [employer] of Lifestyle Medicine. Patient is encouraged to switch to  unprocessed or minimally processed  complex starch, adequate protein intake (mainly plant source), minimal liquid fat ( mainly vegetable oils), plenty of fruits, and vegetables. -  she is advised to stick to a routine  mealtimes to eat 3 complete meals a day and snack only when necessary ( to snack only to correct hypoglycemia BG <70 day time or <100 at night).   -Her engagement with lifestyle medicine nutrition is suboptimal, however,  she acknowledges that there is a room for improvement in her food and drink choices.  - she acknowledges that there is a room for improvement in her food and drink choices. - Suggestion is made for her to avoid simple carbohydrates  from her diet including Cakes, Sweet Desserts, Ice Cream, Soda (diet and regular), Sweet Tea, Candies, Chips, Cookies, Store Bought Juices, Alcohol  , Artificial Sweeteners,  Coffee Creamer, and "Sugar-free" Products, Lemonade. This will help patient to have more stable blood glucose profile and potentially avoid unintended weight gain.  The following Lifestyle Medicine recommendations according to American  College of Lifestyle Medicine  [facility]) were discussed and and offered to patient and she  agrees to start the journey:  A. Whole Foods, Plant-Based Nutrition comprising of fruits and vegetables, plant-based proteins, whole-grain carbohydrates was discussed in detail with the patient.   A list for source of those nutrients were also provided to the patient.  Patient will use only water or unsweetened tea for hydration. B.  The need to stay away from risky substances including alcohol , smoking; obtaining 7 to 9 hours of restorative sleep, at least 150 minutes of moderate intensity exercise weekly, the importance of healthy social connections,  and stress management techniques were discussed. C.  A full color page of  Calorie density of various food groups per pound showing examples of each food groups was provided to the patient.    - she has been scheduled with Penny Crumpton, RDN, CDE for individualized diabetes education.  - I have approached her with the following plan to manage  her diabetes and patient agrees:   -She is accompanied by her husband  to clinic today.  I had a long discussion with the family about her uncontrolled diabetes posing a very serious risk for acute and chronic complications of diabetes including cardiovascular disease.   -Insulin  injection techniques were revised and confirmed with her.  She is advised to rotate her injection sites on preferably abdominal skin.    - She is advised to increase Tresiba  to 110 units nightly, advised to continue Humalog  14-20 units 3 times daily is for Premeal blood glucose readings above 90 mg per DL.  She is advised to continue metformin  500 mg p.o. daily. -She has received a month supply of Jardiance , advised to continue Jardiance  10 mg p.o. daily at breakfast.  She did not afford the co-pays of GLP-1 super agonist.  I gave her more samples of Ozempic  to continue 0.5 mg subcutaneously weekly.   She is encouraged to call clinic for hypoglycemia under 70 or hyperglycemia above 200 mg per DL of weekly average.  - Specific targets for  A1c;  LDL, HDL,  and Triglycerides were discussed with the patient.  2) Blood Pressure /Hypertension:   Her blood pressure is controlled to target.  she is advised to continue her current medications including Imdur  60 mg p.o. daily with breakfast .  3) Lipids/Hyperlipidemia:    Her most recent lipid panel showed improved LDL to 44 from 120.  She is advised to continue  on her current triple agent therapy including fenofibrate  145 mg p.o. daily, 35 acids 1000 mg p.o. twice daily, Crestor  10 mg p.o. daily at bedtime.   She has a chance to simplify this treatment with proper engagement in lifestyle medicine.  She is hesitant to engage with lifestyle medicine for now.  Side effects and precautions discussed with her.  4)  Weight/Diet:   Her Body mass index is 37.93 kg/m.  -   clearly complicating her diabetes care.   she is  a candidate for weight loss. I discussed with her the fact that loss of 5 - 10% of her  current body weight will have the most impact on  her diabetes management.  The above detailed  ACLM recommendations for nutrition, exercise, sleep, social life, avoidance of risky substances, the need for restorative sleep   information will also detailed on discharge instructions.  5) Hypothyroidism:  The circumstances of her diagnosis are not available to review.  Her recent labs were consistent with appropriate replacement.  She is advised to continue levothyroxine  50 mcg p.o. daily before breakfast.     - We discussed about the correct intake of her thyroid  hormone, on empty stomach at fasting, with water, separated by at least 30 minutes from breakfast and other medications,  and separated by more than 4 hours from calcium , iron, multivitamins, acid reflux medications (PPIs). -Patient is made aware of the fact that thyroid  hormone replacement is needed for life, dose to be adjusted by periodic monitoring of thyroid  function tests.   6) Chronic Care/Health Maintenance: -she is on Statin medications and  is encouraged to initiate and continue to follow up with Ophthalmology, Dentist,  Podiatrist at least yearly or according to recommendations, and advised to   stay away from smoking. I have recommended yearly flu vaccine and pneumonia vaccine at least every 5 years; moderate intensity exercise for up to 150 minutes weekly; and  sleep for 7- 9 hours a day.  -She will need to have vitamin D  supplement with vitamin D3 5000 units daily x90 days.  - she is advised to maintain close follow up with Skillman, Katherine E, PA-C for primary care needs, as well as her other providers for optimal and coordinated care.    I spent  42  minutes in the care of the patient today including review of labs from CMP, Lipids, Thyroid  Function, Hematology (current and previous including abstractions from other facilities); face-to-face time discussing  her blood glucose readings/logs, discussing hypoglycemia and hyperglycemia episodes and symptoms, medications  doses, her options of short and long term treatment based on the latest standards of care / guidelines;  discussion about incorporating lifestyle medicine;  and documenting the encounter. Risk reduction counseling performed per USPSTF guidelines to reduce  obesity and cardiovascular risk factors.     Please refer to Patient Instructions for Blood Glucose Monitoring and Insulin /Medications Dosing Guide"  in media tab for additional information. Please  also refer to " Patient Self Inventory" in the Media  tab for reviewed elements of pertinent patient history.  Rachel Odonnell participated in the discussions, expressed understanding, and voiced agreement with the above plans.  All questions were answered to her satisfaction. she is encouraged to contact clinic should she have any questions or concerns prior to her return visit.       Follow up plan: - Return in about 3 months (around 03/24/2024) for F/U with Pre-visit Labs, Meter/CGM/Logs, A1c here.  Hulon Magic, [facility] [address] [place], [place] [address] Phone: [phone] Fax: [phone]  12/23/2023, 3:28 PM

## 2023-12-29 ENCOUNTER — Ambulatory Visit: Attending: Cardiology | Admitting: *Deleted

## 2023-12-29 DIAGNOSIS — Z5181 Encounter for therapeutic drug level monitoring: Secondary | ICD-10-CM | POA: Diagnosis not present

## 2023-12-29 DIAGNOSIS — I4821 Permanent atrial fibrillation: Secondary | ICD-10-CM

## 2023-12-29 LAB — POCT INR: INR: 1.9 — AB (ref 2.0–3.0)

## 2023-12-29 NOTE — Patient Instructions (Signed)
 Take warfarin 1 tablet tonight then resume 1/2 tablet daily except 1 tablet on Sundays, Tuesdays and Thursdays  Recheck INR in 4 week

## 2024-01-14 ENCOUNTER — Telehealth: Payer: Self-pay

## 2024-01-14 DIAGNOSIS — E1165 Type 2 diabetes mellitus with hyperglycemia: Secondary | ICD-10-CM | POA: Diagnosis not present

## 2024-01-14 DIAGNOSIS — E782 Mixed hyperlipidemia: Secondary | ICD-10-CM | POA: Diagnosis not present

## 2024-01-14 NOTE — Telephone Encounter (Signed)
 Tried to return pt's call, left a message requesting pt return call to the office.

## 2024-01-17 ENCOUNTER — Telehealth: Payer: Self-pay

## 2024-01-17 NOTE — Telephone Encounter (Signed)
 Pt called stating she has been experiencing hypoglycemic episodes. Pt states she is taking Jardiance  10mg  daily, Metformin  500mg  daily, Tresiba  110 units at bedtime, Humalog  14-20 units TIDAC and Ozempic  0.5mg  weekly. Pt's CGM data uploaded, printed and evaluated by Dr.Nida. Advised pt to decrease Tresiba  to 100 units at bedtime and continue other medications as prescribed per Dr.Nida. Pt voiced understanding.

## 2024-01-18 DIAGNOSIS — I482 Chronic atrial fibrillation, unspecified: Secondary | ICD-10-CM | POA: Diagnosis not present

## 2024-01-18 DIAGNOSIS — K219 Gastro-esophageal reflux disease without esophagitis: Secondary | ICD-10-CM | POA: Diagnosis not present

## 2024-01-18 DIAGNOSIS — E1165 Type 2 diabetes mellitus with hyperglycemia: Secondary | ICD-10-CM | POA: Diagnosis not present

## 2024-01-18 DIAGNOSIS — I2510 Atherosclerotic heart disease of native coronary artery without angina pectoris: Secondary | ICD-10-CM | POA: Diagnosis not present

## 2024-01-21 NOTE — Progress Notes (Signed)
 Name: Rachel Odonnell DOB: 1955/12/01 MRN: [id number]  History of Present Illness: Rachel Odonnell is a 68 y.o. female who presents today for follow up visit at [facility] [place].  Relevant History includes: 1. Kidney stones. - She has had ESWL twice, 2 ureteroscopies, and she has passed 3 other calculi.  - 09/22/2022: 24 hour urine shows good urine volume, high calcium , high uric acid.  - Taking Indapamide  2.5mg  daily for stone prevention as per Dr. Claretta Croft. 2. Stress urinary incontinence (resolved).  - 02/22/2012: Mid-urethral sling implanted by Dr. Richardine Chancy. 3. OAB with urinary frequency, urgency, and urge incontinence.  - Myrbetriq  and Gemtesa  helpful but cost-prohibitive. 4. Recurrent UTIs. 5. Vaginal atrophy.   Urine culture results in past 12 months: - 04/13/2023: Positive for E. coli  - 08/03/2023: Negative  At last visit on 08/03/2023: > No radio-opaque stones seen on KUB.  > The plan was:  - Topical vaginal estrogen cream nightly. - Gemtesa  75 mg daily (via office samples). - Maintain adequate fluid intake daily. - Return in about 6 months (around 02/01/2024) for KUB, UA, PVR, & f/u with [name].  Since last visit: > 08/19/2023: RUS showed multiple bilateral subcentimeter kidney stones.  Today: KUB today: Awaiting radiology read.   She reports 0 UTIs since last visit.  Reports sporadic use of topical vaginal estrogen cream - forgets to use it.   She reports that her urinary symptoms continue to be well managed on Gemtesa  - denies bothersome urgency, frequency, nocturia, or urge incontinence. Denies dysuria, gross hematuria, hesitancy, straining to void, or sensations of incomplete emptying.  She denies flank pain or abdominal pain. She denies recent stone episode.   Medications: Current Outpatient Medications  Medication Sig Dispense Refill   Accu-Chek Softclix Lancets lancets Use as instructed to test blood glucose four times daily 100 each 2    busPIRone  (BUSPAR ) 5 MG tablet Take 5 mg by mouth at bedtime.     Cholecalciferol (VITAMIN D3) 125 MCG (5000 UT) CAPS Take 1 capsule (5,000 Units total) by mouth daily. 90 capsule 0   citalopram  (CELEXA ) 20 MG tablet Take 1 tablet (20 mg total) by mouth every evening. 30 tablet 4   clopidogrel  (PLAVIX ) 75 MG tablet TAKE 1 TABLET BY MOUTH DAILY 100 tablet 2   Continuous Glucose Receiver (FREESTYLE LIBRE 2 READER) DEVI USE TO CHECK GLUCOSE AS DIRECTED 1 each 0   Continuous Glucose Sensor (FREESTYLE LIBRE 2 SENSOR) MISC CHANGE SENSOR EVERY 14 DAYS USE  TO CHECK BLOOD GLUCOSE  CONTINUOUSLY 6 each 3   donepezil  (ARICEPT ) 5 MG tablet Take 5 mg by mouth every evening.     empagliflozin  (JARDIANCE ) 10 MG TABS tablet Take 10 mg by mouth daily.     estradiol  (ESTRACE ) 0.1 MG/GM vaginal cream Discard plastic applicator. Insert a blueberry size amount (approximately 1 gram) of cream on fingertip inside vagina at bedtime every night for 1 week then every other night. For long term use. 30 g 3   fenofibrate  160 MG tablet Take 1 tablet (160 mg total) by mouth daily. 90 tablet 1   gabapentin  (NEURONTIN ) 100 MG capsule Take 1 capsule (100 mg total) by mouth at bedtime.     glucose blood test strip 1 each by Other route as needed. Use as instructed to test blood glucose four times daily 100 each 2   indapamide  (LOZOL ) 2.5 MG tablet TAKE 1 TABLET EVERY DAY 90 tablet 3   insulin  degludec (TRESIBA  FLEXTOUCH) 200 UNIT/ML  FlexTouch Pen Inject 110 Units into the skin at bedtime. 18 mL 2   insulin  lispro (HUMALOG  KWIKPEN) 100 UNIT/ML KwikPen INJECT 14 TO 20 UNITS INTO THE  SKIN 3 TIMES DAILY WITH MEALS 60 mL 1   Insulin  Pen Needle (B-D ULTRAFINE III SHORT PEN) 31G X 8 MM MISC 1 each by Does not apply route as directed. 100 each 2   isosorbide  mononitrate (IMDUR ) 60 MG 24 hr tablet Take 1.5 tablets (90 mg total) by mouth daily. Can take an additional half tab as needed for chest pain 135 tablet 1   levocetirizine (XYZAL) 5  MG tablet Take 5 mg by mouth every evening.     levothyroxine  (SYNTHROID ) 50 MCG tablet TAKE 1 TABLET BY MOUTH DAILY  BEFORE BREAKFAST 90 tablet 3   Magnesium  Oxide (MAGNESIUM  OXIDE 400) 240 MG PACK Take 400 mg by mouth daily.     metFORMIN  (GLUCOPHAGE ) 500 MG tablet TAKE 1 TABLET BY MOUTH DAILY  WITH BREAKFAST 90 tablet 3   modafinil  (PROVIGIL ) 100 MG tablet Take 1 tablet (100 mg total) by mouth daily. 90 tablet 0   montelukast  (SINGULAIR ) 10 MG tablet Take 10 mg by mouth at bedtime.     Multiple Vitamin (MULTIVITAMIN ADULT PO) Take 1 tablet by mouth daily.     nitroGLYCERIN  (NITROSTAT ) 0.4 MG SL tablet Place 1 tablet (0.4 mg total) under the tongue every 5 (five) minutes as needed for chest pain. 25 tablet 3   OVER THE COUNTER MEDICATION Take 2 capsules by mouth daily. Omega XL     pantoprazole  (PROTONIX ) 40 MG tablet TAKE 2 TABLETS EVERY DAY 180 tablet 1   rOPINIRole (REQUIP) 2 MG tablet Take 2 mg by mouth at bedtime.     rosuvastatin  (CRESTOR ) 40 MG tablet Take 1 tablet (40 mg total) by mouth daily. 30 tablet 5   Semaglutide ,0.25 or 0.5MG /DOS, (OZEMPIC , 0.25 OR 0.5 MG/DOSE,) 2 MG/1.5ML SOPN Inject 0.5 mg into the skin once a week.     Vibegron  (GEMTESA ) 75 MG TABS Take 1 tablet (75 mg total) by mouth daily.     Vibegron  (GEMTESA ) 75 MG TABS Take 1 tablet (75 mg total) by mouth daily. 30 tablet 11   warfarin (COUMADIN ) 5 MG tablet TAKE 1/2 TO 1 TABLET BY MOUTH DAILY AS DIRECTED BY THE COUMADIN  CLINIC. (Patient taking differently: Take 2.5-5 mg by mouth one time only at 4 PM. TAKE 1/2 TO 1 TABLET BY MOUTH DAILY AS DIRECTED BY THE COUMADIN  CLINIC. Sunday, Tuesdays, Thursdays 1 tablet Mondays, Wednesdays, Fridays, and Saturdays 1/2 tablet) 65 tablet 1   estradiol  (ESTRACE ) 2 MG tablet Take 1 mg by mouth daily.     Vibegron  (GEMTESA ) 75 MG TABS Take 1 tablet (75 mg total) by mouth daily.     No current facility-administered medications for this visit.    Allergies: Allergies  Allergen  Reactions   Azithromycin Hives   Codeine Nausea Only   Atomoxetine Nausea And Vomiting   Atorvastatin Other (See Comments)    Restless legs   Lisinopril Nausea And Vomiting    Past Medical History:  Diagnosis Date   ADD (attention deficit disorder)    Diabetes mellitus without complication (HCC)    Hypertension    Past Surgical History:  Procedure Laterality Date   ABDOMINAL HYSTERECTOMY     COLONOSCOPY  02/2022   CORONARY PRESSURE/FFR STUDY N/A 04/16/2023   Procedure: CORONARY PRESSURE/FFR STUDY;  Surgeon: Swaziland, Peter M, MD;  Location: [facility] INVASIVE CV LAB;  Service:  Cardiovascular;  Laterality: N/A;   CORONARY STENT INTERVENTION N/A 04/16/2023   Procedure: CORONARY STENT INTERVENTION;  Surgeon: Swaziland, Peter M, MD;  Location: [facility] INVASIVE CV LAB;  Service: Cardiovascular;  Laterality: N/A;   CORONARY ULTRASOUND/IVUS N/A 04/16/2023   Procedure: Coronary Ultrasound/IVUS;  Surgeon: Swaziland, Peter M, MD;  Location: [facility] INVASIVE CV LAB;  Service: Cardiovascular;  Laterality: N/A;   EXCISIONAL HEMORRHOIDECTOMY     INCONTINENCE SURGERY     RIGHT/LEFT HEART CATH AND CORONARY ANGIOGRAPHY N/A 06/13/2021   Procedure: RIGHT/LEFT HEART CATH AND CORONARY ANGIOGRAPHY;  Surgeon: Swaziland, Peter M, MD;  Location: [facility] INVASIVE CV LAB;  Service: Cardiovascular;  Laterality: N/A;   RIGHT/LEFT HEART CATH AND CORONARY ANGIOGRAPHY N/A 04/16/2023   Procedure: RIGHT/LEFT HEART CATH AND CORONARY ANGIOGRAPHY;  Surgeon: Swaziland, Peter M, MD;  Location: [facility] INVASIVE CV LAB;  Service: Cardiovascular;  Laterality: N/A;   Family History  Problem Relation Age of Onset   Cancer Mother    Diabetes Father    Heart Problems Father    COPD Father    Emphysema Father    Heart block Father    Congestive Heart Failure Father    Social History   Socioeconomic History   Marital status: Married    Spouse name: Gaetana Jones   Number of children: Not on file   Years of education: Not on file   Highest education level: Not on file   Occupational History   Not on file  Tobacco Use   Smoking status: Former    Current packs/day: 0.00    Types: Cigarettes    Quit date: 08/17/1994    Years since quitting: 29.4   Smokeless tobacco: Never  Vaping Use   Vaping status: Never Used  Substance and Sexual Activity   Alcohol  use: No   Drug use: No   Sexual activity: Yes  Other Topics Concern   Not on file  Social History Narrative   Lives at home with husband, Gaetana Jones   Former smoker Smoked 2 ppd x 25 yrs; Quit in 1996   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, retail  retired Secondary school teacher, still works part-time at the [employer], used to work Editor, commissioning   Now on fixed income from her SSI and pension   Social Drivers of Corporate investment banker Strain: Low Risk  (10/10/2021)   Overall Financial Resource Strain (CARDIA)    Difficulty of Paying Living Expenses: Not hard at all  Food Insecurity: No Food Insecurity (04/16/2023)   Hunger Vital Sign    Worried About Programme researcher, broadcasting/film/video in the Last Year: Never true    Ran Out of Food in the Last Year: Never true  Transportation Needs: No Transportation Needs (04/16/2023)   PRAPARE - Administrator, Civil Service (Medical): No    Lack of Transportation (Non-Medical): No  Physical Activity: Inactive (04/08/2023)   Received from [facility]   Exercise Vital Sign    On average, how many days per week do you engage in moderate to strenuous exercise (like a brisk walk)?: 0 days    On average, how many minutes do you engage in exercise at this level?: 0 min  Stress: No Stress Concern Present (10/10/2021)   [employer] of Occupational Health - Occupational Stress Questionnaire    Feeling of Stress : Only a little  Social Connections: Socially Integrated (10/10/2021)   Social Connection and Isolation Panel    Frequency of Communication with Friends  and Family: More than three times a week    Frequency of Social Gatherings with  Friends and Family: More than three times a week    Attends Religious Services: More than 4 times per year    Active Member of [employer] or Organizations: Yes    Attends Engineer, structural: More than 4 times per year    Marital Status: Married  Catering manager Violence: Not At Risk (04/16/2023)   Humiliation, Afraid, Rape, and Kick questionnaire    Fear of Current or Ex-Partner: No    Emotionally Abused: No    Physically Abused: No    Sexually Abused: No    Review of Systems Constitutional: Patient denies any unintentional weight loss or change in strength lntegumentary: Patient denies any rashes or pruritus Cardiovascular: Patient denies chest pain or syncope Respiratory: Patient denies shortness of breath Gastrointestinal: Patient denies nausea, vomiting, constipation, or diarrhea  Musculoskeletal: Patient denies muscle cramps or weakness Neurologic: Patient denies convulsions or seizures Allergic/Immunologic: Patient denies recent allergic reaction(s) Hematologic/Lymphatic: Patient denies bleeding tendencies Endocrine: Patient denies heat/cold intolerance  GU: As per HPI.  OBJECTIVE Vitals:   02/01/24 1407  BP: 127/71  Pulse: 73   There is no height or weight on file to calculate BMI.  Physical Examination Constitutional: No obvious distress; patient is non-toxic appearing  Cardiovascular: No visible lower extremity edema.  Respiratory: The patient does not have audible wheezing/stridor; respirations do not appear labored  Gastrointestinal: Abdomen non-distended Musculoskeletal: Normal ROM of UEs  Skin: No obvious rashes/open sores  Neurologic: CN 2-12 grossly intact Psychiatric: Answered questions appropriately with normal affect  Hematologic/Lymphatic/Immunologic: No obvious bruises or sites of spontaneous bleeding  UA: negative  PVR: 0 ml  ASSESSMENT Recurrent UTI - Plan: Urinalysis, Routine w reflex microscopic, BLADDER SCAN AMB NON-IMAGING, Vibegron   (GEMTESA ) 75 MG TABS, US  RENAL  OAB (overactive bladder) - Plan: Urinalysis, Routine w reflex microscopic, BLADDER SCAN AMB NON-IMAGING, Vibegron  (GEMTESA ) 75 MG TABS  Kidney stones - Plan: Urinalysis, Routine w reflex microscopic, BLADDER SCAN AMB NON-IMAGING, Vibegron  (GEMTESA ) 75 MG TABS, DG Abd 1 View, US  RENAL  Urge incontinence - Plan: Urinalysis, Routine w reflex microscopic, BLADDER SCAN AMB NON-IMAGING, Vibegron  (GEMTESA ) 75 MG TABS  Presence of urogenital implant - Plan: Urinalysis, Routine w reflex microscopic, BLADDER SCAN AMB NON-IMAGING, Vibegron  (GEMTESA ) 75 MG TABS  History of stress incontinence - Plan: Urinalysis, Routine w reflex microscopic, BLADDER SCAN AMB NON-IMAGING, Vibegron  (GEMTESA ) 75 MG TABS  Doing well today. No stone or UTI symptoms. OK to proceed without topical vaginal estrogen cream since she has been doing fine / not having any UTIs without it. Will continue Gemtesa  for OAB. We agreed to plan for follow up in 1 year or sooner if needed. Patient verbalized understanding of and agreement with current plan. All questions were answered.  PLAN Advised the following: 1. Continue Gemtesa  (Vibegron ) 75 mg daily. 2. Return in about 1 year (around 01/31/2025) for Recurrent UTI, Stone, OAB, with UA & PVR, will need KUB & RUS prior.  Orders Placed This Encounter  Procedures   DG Abd 1 View    Standing Status:   Future    Expected Date:   01/31/2025    Expiration Date:   01/31/2025    Reason for Exam (SYMPTOM  OR DIAGNOSIS REQUIRED):   kidney stone    Preferred imaging location?:   [facility]   US  RENAL    Standing Status:   Future    Expected Date:  01/31/2025    Expiration Date:   01/31/2025    Reason for Exam (SYMPTOM  OR DIAGNOSIS REQUIRED):   kidney stone known or suspected    Preferred imaging location?:   [facility] [address]   Urinalysis, Routine w reflex microscopic   BLADDER SCAN AMB NON-IMAGING    It has been explained that the patient is to  follow regularly with their PCP in addition to all other providers involved in their care and to follow instructions provided by these respective offices. Patient advised to contact urology clinic if any urologic-pertaining questions, concerns, new symptoms or problems arise in the interim period.  There are no Patient Instructions on file for this visit.  Electronically signed by:  Lauretta Ponto, FNP   02/01/24    3:19 PM

## 2024-01-25 DIAGNOSIS — S3992XS Unspecified injury of lower back, sequela: Secondary | ICD-10-CM | POA: Diagnosis not present

## 2024-01-25 DIAGNOSIS — X58XXXA Exposure to other specified factors, initial encounter: Secondary | ICD-10-CM | POA: Diagnosis not present

## 2024-01-25 DIAGNOSIS — S34109S Unspecified injury to unspecified level of lumbar spinal cord, sequela: Secondary | ICD-10-CM | POA: Diagnosis not present

## 2024-01-25 DIAGNOSIS — R200 Anesthesia of skin: Secondary | ICD-10-CM | POA: Diagnosis not present

## 2024-01-25 DIAGNOSIS — M5417 Radiculopathy, lumbosacral region: Secondary | ICD-10-CM | POA: Diagnosis not present

## 2024-01-26 ENCOUNTER — Encounter: Attending: Cardiology | Admitting: *Deleted

## 2024-01-26 DIAGNOSIS — Z5181 Encounter for therapeutic drug level monitoring: Secondary | ICD-10-CM | POA: Diagnosis not present

## 2024-01-26 DIAGNOSIS — I4821 Permanent atrial fibrillation: Secondary | ICD-10-CM | POA: Insufficient documentation

## 2024-01-26 DIAGNOSIS — Z1231 Encounter for screening mammogram for malignant neoplasm of breast: Secondary | ICD-10-CM | POA: Diagnosis not present

## 2024-01-26 LAB — POCT INR: INR: 1.5 — AB (ref 2.0–3.0)

## 2024-01-26 NOTE — Patient Instructions (Signed)
Take warfarin 1 tablet tonight then increase dose to 1 tablet daily except 1/2 tablet on Mondays, Wednesdays and Fridays .  Recheck INR in 2 weeks

## 2024-01-31 DIAGNOSIS — S34109D Unspecified injury to unspecified level of lumbar spinal cord, subsequent encounter: Secondary | ICD-10-CM | POA: Diagnosis not present

## 2024-01-31 DIAGNOSIS — M4316 Spondylolisthesis, lumbar region: Secondary | ICD-10-CM | POA: Diagnosis not present

## 2024-01-31 DIAGNOSIS — M48061 Spinal stenosis, lumbar region without neurogenic claudication: Secondary | ICD-10-CM | POA: Diagnosis not present

## 2024-01-31 DIAGNOSIS — S3992XD Unspecified injury of lower back, subsequent encounter: Secondary | ICD-10-CM | POA: Diagnosis not present

## 2024-01-31 DIAGNOSIS — R200 Anesthesia of skin: Secondary | ICD-10-CM | POA: Diagnosis not present

## 2024-01-31 DIAGNOSIS — M5417 Radiculopathy, lumbosacral region: Secondary | ICD-10-CM | POA: Diagnosis not present

## 2024-02-01 ENCOUNTER — Encounter: Payer: Self-pay | Admitting: Urology

## 2024-02-01 ENCOUNTER — Ambulatory Visit (HOSPITAL_COMMUNITY)
Admission: RE | Admit: 2024-02-01 | Discharge: 2024-02-01 | Disposition: A | Discharge status: Home / Self Care | Source: Ambulatory Visit | Attending: Urology | Admitting: Urology

## 2024-02-01 ENCOUNTER — Ambulatory Visit (INDEPENDENT_AMBULATORY_CARE_PROVIDER_SITE_OTHER): Payer: Medicare HMO | Admitting: Urology

## 2024-02-01 VITALS — BP 127/71 | HR 73

## 2024-02-01 DIAGNOSIS — N3941 Urge incontinence: Secondary | ICD-10-CM | POA: Diagnosis not present

## 2024-02-01 DIAGNOSIS — N3281 Overactive bladder: Secondary | ICD-10-CM | POA: Diagnosis not present

## 2024-02-01 DIAGNOSIS — N390 Urinary tract infection, site not specified: Secondary | ICD-10-CM

## 2024-02-01 DIAGNOSIS — Z87440 Personal history of urinary (tract) infections: Secondary | ICD-10-CM | POA: Diagnosis not present

## 2024-02-01 DIAGNOSIS — Z960 Presence of urogenital implants: Secondary | ICD-10-CM

## 2024-02-01 DIAGNOSIS — Z87442 Personal history of urinary calculi: Secondary | ICD-10-CM | POA: Diagnosis not present

## 2024-02-01 DIAGNOSIS — Z87448 Personal history of other diseases of urinary system: Secondary | ICD-10-CM | POA: Diagnosis not present

## 2024-02-01 DIAGNOSIS — N200 Calculus of kidney: Secondary | ICD-10-CM

## 2024-02-01 LAB — URINALYSIS, ROUTINE W REFLEX MICROSCOPIC
Bilirubin, UA: NEGATIVE
Ketones, UA: NEGATIVE
Leukocytes,UA: NEGATIVE
Nitrite, UA: NEGATIVE
Protein,UA: NEGATIVE
RBC, UA: NEGATIVE
Specific Gravity, UA: 1.025 (ref 1.005–1.030)
Urobilinogen, Ur: 1 mg/dL (ref 0.2–1.0)
pH, UA: 6 (ref 5.0–7.5)

## 2024-02-01 LAB — BLADDER SCAN AMB NON-IMAGING: Scan Result: 0

## 2024-02-01 MED ORDER — GEMTESA 75 MG PO TABS: 75.0000 mg | ORAL_TABLET | Freq: Every day | ORAL | 11 refills | Status: AC

## 2024-02-14 ENCOUNTER — Ambulatory Visit

## 2024-02-14 DIAGNOSIS — E1165 Type 2 diabetes mellitus with hyperglycemia: Secondary | ICD-10-CM | POA: Diagnosis not present

## 2024-02-14 DIAGNOSIS — E782 Mixed hyperlipidemia: Secondary | ICD-10-CM | POA: Diagnosis not present

## 2024-02-15 DIAGNOSIS — S34109S Unspecified injury to unspecified level of lumbar spinal cord, sequela: Secondary | ICD-10-CM | POA: Diagnosis not present

## 2024-02-15 DIAGNOSIS — R200 Anesthesia of skin: Secondary | ICD-10-CM | POA: Diagnosis not present

## 2024-02-15 DIAGNOSIS — M48061 Spinal stenosis, lumbar region without neurogenic claudication: Secondary | ICD-10-CM | POA: Diagnosis not present

## 2024-02-15 DIAGNOSIS — M5417 Radiculopathy, lumbosacral region: Secondary | ICD-10-CM | POA: Diagnosis not present

## 2024-02-21 ENCOUNTER — Telehealth: Payer: Self-pay | Admitting: Urology

## 2024-02-21 ENCOUNTER — Ambulatory Visit

## 2024-02-21 ENCOUNTER — Telehealth: Payer: Self-pay | Admitting: Cardiology

## 2024-02-21 ENCOUNTER — Other Ambulatory Visit: Payer: Self-pay

## 2024-02-21 ENCOUNTER — Other Ambulatory Visit (HOSPITAL_BASED_OUTPATIENT_CLINIC_OR_DEPARTMENT_OTHER): Payer: Self-pay | Admitting: Pulmonary Disease

## 2024-02-21 DIAGNOSIS — Z87442 Personal history of urinary calculi: Secondary | ICD-10-CM

## 2024-02-21 DIAGNOSIS — I4891 Unspecified atrial fibrillation: Secondary | ICD-10-CM

## 2024-02-21 MED ORDER — WARFARIN SODIUM 5 MG PO TABS
ORAL_TABLET | ORAL | 1 refills | Status: DC
Start: 2024-02-21 — End: 2024-05-01

## 2024-02-21 MED ORDER — INDAPAMIDE 2.5 MG PO TABS: 2.5000 mg | ORAL_TABLET | Freq: Every day | ORAL | 3 refills | Status: DC

## 2024-02-21 NOTE — Telephone Encounter (Signed)
*  STAT* If patient is at the pharmacy, call can be transferred to refill team.   1. Which medications need to be refilled? (please list name of each medication and dose if known) warfarin (COUMADIN ) 5 MG tablet   NEW PHARMACY   4. Which pharmacy/location (including street and city if local pharmacy) is medication to be sent to?  [facility] - [place], [place] - [address] Phone: [phone]  Fax: [phone]       5. Do they need a 30 day or 90 day supply? 90

## 2024-02-21 NOTE — Telephone Encounter (Signed)
 Patient is completely out of medication  indapamide  (LOZOL ) 2.5 MG tabletplease call into [facility]   Patient is also needing samples of Gemtesa 

## 2024-02-21 NOTE — Telephone Encounter (Signed)
 Rx refill sent to pharmacy.

## 2024-02-21 NOTE — Telephone Encounter (Signed)
 Copied from CRM [id number]. Topic: Clinical - Medication Refill >> Feb 21, 2024 10:05 AM Rozanna G wrote: Medication: modafinil  (PROVIGIL ) 100 MG tablet  Has the patient contacted their pharmacy? Yes (Agent: If no, request that the patient contact the pharmacy for the refill. If patient does not wish to contact the pharmacy document the reason why and proceed with request.) (Agent: If yes, when and what did the pharmacy advise?)  This is the patient's preferred pharmacy:  [facility] - [place], [place] - [address] [place] [address] Phone: [phone] Fax: [phone]  Is this the correct pharmacy for this prescription? Yes If no, delete pharmacy and type the correct one.   Has the prescription been filled recently? Yes  Is the patient out of the medication? No  Has the patient been seen for an appointment in the last year OR does the patient have an upcoming appointment? Yes  Can we respond through MyChart? No  Agent: Please be advised that Rx refills may take up to 3 business days. We ask that you follow-up with your pharmacy.

## 2024-02-21 NOTE — Telephone Encounter (Signed)
 Warfarin 5mg  refill Afib Last INR 01/26/24 Last OV 09/06/23

## 2024-02-22 ENCOUNTER — Ambulatory Visit: Attending: Cardiology

## 2024-02-22 DIAGNOSIS — Z5181 Encounter for therapeutic drug level monitoring: Secondary | ICD-10-CM | POA: Insufficient documentation

## 2024-02-22 DIAGNOSIS — I4821 Permanent atrial fibrillation: Secondary | ICD-10-CM | POA: Insufficient documentation

## 2024-02-22 LAB — POCT INR: INR: 2 (ref 2.0–3.0)

## 2024-02-22 NOTE — Telephone Encounter (Signed)
**Note De-identified  [place] Obfuscation** Please advise 

## 2024-02-22 NOTE — Patient Instructions (Signed)
 Increase warfarin to  1 tablet daily except 1/2 tablet on Mondays and Thursdays  Recheck INR in 3 weeks

## 2024-02-24 NOTE — Telephone Encounter (Signed)
 Patient now scheduled for 7/11 with [facility]. NFN.

## 2024-02-25 ENCOUNTER — Ambulatory Visit: Admitting: Primary Care

## 2024-02-28 NOTE — Progress Notes (Signed)
Please see anticoagulation encounter.

## 2024-03-01 NOTE — Telephone Encounter (Signed)
 Patient canceled appointment on 7/11 with Beth no follow up scheduled   Controlled substance

## 2024-03-02 MED ORDER — MODAFINIL 100 MG PO TABS: 100.0000 mg | ORAL_TABLET | Freq: Every day | ORAL | 0 refills | Status: DC

## 2024-03-02 NOTE — Telephone Encounter (Signed)
 Modafinil  refill x90 tabs x 1

## 2024-03-09 ENCOUNTER — Ambulatory Visit: Payer: Medicare Other | Admitting: Nurse Practitioner

## 2024-03-14 ENCOUNTER — Encounter

## 2024-03-15 ENCOUNTER — Ambulatory Visit: Attending: Cardiology | Admitting: *Deleted

## 2024-03-15 DIAGNOSIS — Z5181 Encounter for therapeutic drug level monitoring: Secondary | ICD-10-CM | POA: Diagnosis not present

## 2024-03-15 DIAGNOSIS — I4821 Permanent atrial fibrillation: Secondary | ICD-10-CM | POA: Diagnosis not present

## 2024-03-15 LAB — POCT INR: INR: 2.8 (ref 2.0–3.0)

## 2024-03-15 NOTE — Patient Instructions (Signed)
Continue warfarin 1 tablet daily except 1/2 tablet on Mondays and Thursdays ?Recheck INR in 4 weeks.  ?

## 2024-03-16 DIAGNOSIS — E1165 Type 2 diabetes mellitus with hyperglycemia: Secondary | ICD-10-CM | POA: Diagnosis not present

## 2024-03-16 DIAGNOSIS — E782 Mixed hyperlipidemia: Secondary | ICD-10-CM | POA: Diagnosis not present

## 2024-03-17 DIAGNOSIS — E039 Hypothyroidism, unspecified: Secondary | ICD-10-CM | POA: Diagnosis not present

## 2024-03-17 DIAGNOSIS — R5383 Other fatigue: Secondary | ICD-10-CM | POA: Diagnosis not present

## 2024-03-17 DIAGNOSIS — E7849 Other hyperlipidemia: Secondary | ICD-10-CM | POA: Diagnosis not present

## 2024-03-17 DIAGNOSIS — E1165 Type 2 diabetes mellitus with hyperglycemia: Secondary | ICD-10-CM | POA: Diagnosis not present

## 2024-03-17 LAB — LAB REPORT - SCANNED: EGFR: 60.3

## 2024-03-20 NOTE — Progress Notes (Signed)
 INR-2.8 Please see anticoagulation encounter

## 2024-03-23 ENCOUNTER — Telehealth: Payer: Self-pay

## 2024-03-23 ENCOUNTER — Ambulatory Visit (INDEPENDENT_AMBULATORY_CARE_PROVIDER_SITE_OTHER): Admitting: "Endocrinology

## 2024-03-23 ENCOUNTER — Encounter: Payer: Self-pay | Admitting: "Endocrinology

## 2024-03-23 VITALS — BP 114/42 | HR 60 | Ht 64.0 in | Wt 217.4 lb

## 2024-03-23 DIAGNOSIS — E1159 Type 2 diabetes mellitus with other circulatory complications: Secondary | ICD-10-CM

## 2024-03-23 DIAGNOSIS — Z794 Long term (current) use of insulin: Secondary | ICD-10-CM | POA: Diagnosis not present

## 2024-03-23 DIAGNOSIS — E782 Mixed hyperlipidemia: Secondary | ICD-10-CM

## 2024-03-23 DIAGNOSIS — I10 Essential (primary) hypertension: Secondary | ICD-10-CM

## 2024-03-23 DIAGNOSIS — E039 Hypothyroidism, unspecified: Secondary | ICD-10-CM

## 2024-03-23 MED ORDER — EMPAGLIFLOZIN 25 MG PO TABS: 25.0000 mg | ORAL_TABLET | Freq: Every day | ORAL | 1 refills | Status: AC

## 2024-03-23 MED ORDER — NOVOLOG FLEXPEN 100 UNIT/ML ~~LOC~~ SOPN: 16.0000 [IU] | PEN_INJECTOR | Freq: Three times a day (TID) | SUBCUTANEOUS | 3 refills | Status: DC

## 2024-03-23 MED ORDER — TRESIBA FLEXTOUCH 200 UNIT/ML ~~LOC~~ SOPN: 110.0000 [IU] | PEN_INJECTOR | Freq: Every day | SUBCUTANEOUS | 2 refills | Status: DC

## 2024-03-23 NOTE — Progress Notes (Signed)
 03/23/2024, 2:22 PM   Endocrinology follow-up note  Subjective:    Patient ID: Rachel Odonnell, female    DOB: 01-02-1956.  Rachel Odonnell is being seen in follow-up after she was seen in consultation for  management of currently uncontrolled, symptomatic type 2 diabetes requested by  Skillman, Katherine E, PA-C.   Past Medical History:  Diagnosis Date   ADD (attention deficit disorder)    Diabetes mellitus without complication (HCC)    Hypertension     Past Surgical History:  Procedure Laterality Date   ABDOMINAL HYSTERECTOMY     COLONOSCOPY  02/2022   CORONARY PRESSURE/FFR STUDY N/A 04/16/2023   Procedure: CORONARY PRESSURE/FFR STUDY;  Surgeon: Swaziland, Peter M, MD;  Location: [facility] INVASIVE CV LAB;  Service: Cardiovascular;  Laterality: N/A;   CORONARY STENT INTERVENTION N/A 04/16/2023   Procedure: CORONARY STENT INTERVENTION;  Surgeon: Swaziland, Peter M, MD;  Location: [facility] INVASIVE CV LAB;  Service: Cardiovascular;  Laterality: N/A;   CORONARY ULTRASOUND/IVUS N/A 04/16/2023   Procedure: Coronary Ultrasound/IVUS;  Surgeon: Swaziland, Peter M, MD;  Location: [facility] INVASIVE CV LAB;  Service: Cardiovascular;  Laterality: N/A;   EXCISIONAL HEMORRHOIDECTOMY     INCONTINENCE SURGERY     RIGHT/LEFT HEART CATH AND CORONARY ANGIOGRAPHY N/A 06/13/2021   Procedure: RIGHT/LEFT HEART CATH AND CORONARY ANGIOGRAPHY;  Surgeon: Swaziland, Peter M, MD;  Location: [facility] INVASIVE CV LAB;  Service: Cardiovascular;  Laterality: N/A;   RIGHT/LEFT HEART CATH AND CORONARY ANGIOGRAPHY N/A 04/16/2023   Procedure: RIGHT/LEFT HEART CATH AND CORONARY ANGIOGRAPHY;  Surgeon: Swaziland, Peter M, MD;  Location: [facility] INVASIVE CV LAB;  Service: Cardiovascular;  Laterality: N/A;    Social History   Socioeconomic History   Marital status: Married    Spouse name: Meribeth   Number of children: Not on file   Years of education: Not on file   Highest education  level: Not on file  Occupational History   Not on file  Tobacco Use   Smoking status: Former    Current packs/day: 0.00    Types: Cigarettes    Quit date: 08/17/1994    Years since quitting: 29.6   Smokeless tobacco: Never  Vaping Use   Vaping status: Never Used  Substance and Sexual Activity   Alcohol  use: No   Drug use: No   Sexual activity: Yes  Other Topics Concern   Not on file  Social History Narrative   Lives at home with husband, Meribeth   Former smoker Smoked 2 ppd x 25 yrs; Quit in 1996   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, retail  retired Secondary school teacher, still works part-time at the [employer], used to work Editor, commissioning   Now on fixed income from her SSI and pension   Social Drivers of Corporate investment banker Strain: Low Risk  (10/10/2021)   Overall Financial Resource Strain (CARDIA)    Difficulty of Paying Living Expenses: Not hard at all  Food Insecurity: No Food Insecurity (04/16/2023)   Hunger Vital Sign    Worried About Running Out of Food in the Last Year: Never true    Ran  Out of Food in the Last Year: Never true  Transportation Needs: No Transportation Needs (04/16/2023)   PRAPARE - Administrator, Civil Service (Medical): No    Lack of Transportation (Non-Medical): No  Physical Activity: Inactive (04/08/2023)   Received from [facility]   Exercise Vital Sign    On average, how many days per week do you engage in moderate to strenuous exercise (like a brisk walk)?: 0 days    On average, how many minutes do you engage in exercise at this level?: 0 min  Stress: No Stress Concern Present (10/10/2021)   [employer] of Occupational Health - Occupational Stress Questionnaire    Feeling of Stress : Only a little  Social Connections: Socially Integrated (10/10/2021)   Social Connection and Isolation Panel    Frequency of Communication with Friends and Family: More than three times a week    Frequency of Social  Gatherings with Friends and Family: More than three times a week    Attends Religious Services: More than 4 times per year    Active Member of [employer] or Organizations: Yes    Attends Engineer, structural: More than 4 times per year    Marital Status: Married    Family History  Problem Relation Age of Onset   Cancer Mother    Diabetes Father    Heart Problems Father    COPD Father    Emphysema Father    Heart block Father    Congestive Heart Failure Father     Outpatient Encounter Medications as of 03/23/2024  Medication Sig   insulin  aspart (NOVOLOG  FLEXPEN) 100 UNIT/ML FlexPen Inject 16-22 Units into the skin 3 (three) times daily before meals.   [DISCONTINUED] insulin  degludec (TRESIBA  FLEXTOUCH) 200 UNIT/ML FlexTouch Pen Inject 110 Units into the skin at bedtime. (Patient taking differently: Inject 100 Units into the skin at bedtime.)   Accu-Chek Softclix Lancets lancets Use as instructed to test blood glucose four times daily   busPIRone  (BUSPAR ) 5 MG tablet Take 5 mg by mouth at bedtime.   Cholecalciferol (VITAMIN D3) 125 MCG (5000 UT) CAPS Take 1 capsule (5,000 Units total) by mouth daily.   citalopram  (CELEXA ) 20 MG tablet Take 1 tablet (20 mg total) by mouth every evening.   clopidogrel  (PLAVIX ) 75 MG tablet TAKE 1 TABLET BY MOUTH DAILY   Continuous Glucose Receiver (FREESTYLE LIBRE 2 READER) DEVI USE TO CHECK GLUCOSE AS DIRECTED   Continuous Glucose Sensor (FREESTYLE LIBRE 2 SENSOR) MISC CHANGE SENSOR EVERY 14 DAYS USE  TO CHECK BLOOD GLUCOSE  CONTINUOUSLY   donepezil  (ARICEPT ) 5 MG tablet Take 5 mg by mouth every evening.   empagliflozin  (JARDIANCE ) 25 MG TABS tablet Take 1 tablet (25 mg total) by mouth daily.   estradiol  (ESTRACE ) 0.1 MG/GM vaginal cream Discard plastic applicator. Insert a blueberry size amount (approximately 1 gram) of cream on fingertip inside vagina at bedtime every night for 1 week then every other night. For long term use.   estradiol  (ESTRACE )  2 MG tablet Take 1 mg by mouth daily.   fenofibrate  160 MG tablet Take 1 tablet (160 mg total) by mouth daily.   gabapentin  (NEURONTIN ) 100 MG capsule Take 1 capsule (100 mg total) by mouth at bedtime.   glucose blood test strip 1 each by Other route as needed. Use as instructed to test blood glucose four times daily   indapamide  (LOZOL ) 2.5 MG tablet Take 1 tablet (2.5 mg total) by mouth daily.  insulin  degludec (TRESIBA  FLEXTOUCH) 200 UNIT/ML FlexTouch Pen Inject 110 Units into the skin at bedtime.   Insulin  Pen Needle (B-D ULTRAFINE III SHORT PEN) 31G X 8 MM MISC 1 each by Does not apply route as directed.   isosorbide  mononitrate (IMDUR ) 60 MG 24 hr tablet Take 1.5 tablets (90 mg total) by mouth daily. Can take an additional half tab as needed for chest pain   levocetirizine (XYZAL) 5 MG tablet Take 5 mg by mouth every evening.   levothyroxine  (SYNTHROID ) 50 MCG tablet TAKE 1 TABLET BY MOUTH DAILY  BEFORE BREAKFAST   Magnesium  Oxide (MAGNESIUM  OXIDE 400) 240 MG PACK Take 400 mg by mouth daily.   metFORMIN  (GLUCOPHAGE ) 500 MG tablet TAKE 1 TABLET BY MOUTH DAILY  WITH BREAKFAST   modafinil  (PROVIGIL ) 100 MG tablet Take 1 tablet (100 mg total) by mouth daily.   montelukast  (SINGULAIR ) 10 MG tablet Take 10 mg by mouth at bedtime.   Multiple Vitamin (MULTIVITAMIN ADULT PO) Take 1 tablet by mouth daily.   nitroGLYCERIN  (NITROSTAT ) 0.4 MG SL tablet Place 1 tablet (0.4 mg total) under the tongue every 5 (five) minutes as needed for chest pain.   OVER THE COUNTER MEDICATION Take 2 capsules by mouth daily. Omega XL   pantoprazole  (PROTONIX ) 40 MG tablet TAKE 2 TABLETS EVERY DAY   rOPINIRole (REQUIP) 2 MG tablet Take 2 mg by mouth at bedtime.   rosuvastatin  (CRESTOR ) 40 MG tablet Take 1 tablet (40 mg total) by mouth daily.   Semaglutide ,0.25 or 0.5MG /DOS, (OZEMPIC , 0.25 OR 0.5 MG/DOSE,) 2 MG/1.5ML SOPN Inject 0.5 mg into the skin once a week.   Vibegron  (GEMTESA ) 75 MG TABS Take 1 tablet (75 mg total)  by mouth daily.   Vibegron  (GEMTESA ) 75 MG TABS Take 1 tablet (75 mg total) by mouth daily.   Vibegron  (GEMTESA ) 75 MG TABS Take 1 tablet (75 mg total) by mouth daily.   warfarin (COUMADIN ) 5 MG tablet TAKE 1/2 TO 1 TABLET BY MOUTH DAILY AS DIRECTED BY THE COUMADIN  CLINIC.   [DISCONTINUED] empagliflozin  (JARDIANCE ) 10 MG TABS tablet Take 10 mg by mouth daily.   [DISCONTINUED] insulin  lispro (HUMALOG  KWIKPEN) 100 UNIT/ML KwikPen INJECT 14 TO 20 UNITS INTO THE  SKIN 3 TIMES DAILY WITH MEALS   No facility-administered encounter medications on file as of 03/23/2024.    ALLERGIES: Allergies  Allergen Reactions   Azithromycin Hives   Codeine Nausea Only   Atomoxetine Nausea And Vomiting   Atorvastatin Other (See Comments)    Restless legs   Lisinopril Nausea And Vomiting    VACCINATION STATUS: Immunization History  Administered Date(s) Administered   Fluad Quad(high Dose 65+) 05/14/2021   Moderna SARS-COV2 Booster Vaccination 06/13/2020   Moderna Sars-Covid-2 Vaccination 09/06/2019, 10/04/2019   Pneumococcal Polysaccharide-23 04/22/2017   Pneumococcal-Unspecified 05/31/2020   Tdap 04/22/2017   Zoster Recombinant(Shingrix) 11/30/2017, 02/20/2018    Diabetes She presents for her follow-up diabetic visit. She has type 2 diabetes mellitus. Onset time: Patient was diagnosed at approximate age of 49 years. Her disease course has been fluctuating. There are no hypoglycemic associated symptoms. Pertinent negatives for hypoglycemia include no confusion, headaches, pallor or seizures. Pertinent negatives for diabetes include no blurred vision, no chest pain, no fatigue, no polydipsia, no polyphagia and no polyuria. There are no hypoglycemic complications. Symptoms are worsening. Diabetic complications include heart disease and peripheral neuropathy. Risk factors for coronary artery disease include dyslipidemia, diabetes mellitus, family history, obesity, post-menopausal, sedentary lifestyle, tobacco  exposure and hypertension. Current diabetic  treatment includes insulin  injections and oral agent (dual therapy). Her weight is decreasing steadily. She is following a generally unhealthy diet. When asked about meal planning, she reported none. She never participates in exercise. Her home blood glucose trend is increasing steadily. Her breakfast blood glucose range is generally >200 mg/dl. Her lunch blood glucose range is generally >200 mg/dl. Her dinner blood glucose range is generally >200 mg/dl. Her bedtime blood glucose range is generally >200 mg/dl. Her overall blood glucose range is >200 mg/dl. (She presents with her CGM, showing persistently above target glycemic profile with average of 238 mg per DL for the most recent 2 weeks.  Her AGP report shows 19% time in range, 45% Libre 1 hyperglycemia, 36% level 2 hyperglycemia.  Her point-of-care A1c is 9.5%, slightly improving from 11% from last measurement.) An ACE inhibitor/angiotensin II receptor blocker is not being taken. Eye exam is current.  Hyperlipidemia This is a chronic problem. The current episode started more than 1 year ago. The problem is uncontrolled. Exacerbating diseases include diabetes, hypothyroidism and obesity. Pertinent negatives include no chest pain, myalgias or shortness of breath. Current antihyperlipidemic treatment includes statins, fibric acid derivatives and bile acid sequestrants. Risk factors for coronary artery disease include family history, dyslipidemia, obesity, a sedentary lifestyle, post-menopausal, hypertension and diabetes mellitus.  Hypertension This is a chronic problem. The current episode started more than 1 year ago. Pertinent negatives include no blurred vision, chest pain, headaches, palpitations or shortness of breath. Risk factors for coronary artery disease include dyslipidemia, diabetes mellitus, family history, obesity, post-menopausal state, smoking/tobacco exposure and sedentary lifestyle. Past treatments  include direct vasodilators. Hypertensive end-organ damage includes CAD/MI.    Review of systems  Constitutional: + Lost 8 pounds since March 2025 ,  current Body mass index is 37.32 kg/m. , no fatigue, no subjective hyperthermia, no subjective hypothermia Eyes: no blurry vision, no xerophthalmia    Objective:    BP (!) 114/42   Pulse 60   Ht 5' 4 (1.626 m)   Wt 217 lb 6.4 oz (98.6 kg)   BMI 37.32 kg/m   Wt Readings from Last 3 Encounters:  03/23/24 217 lb 6.4 oz (98.6 kg)  12/23/23 221 lb (100.2 kg)  11/11/23 225 lb 6.4 oz (102.2 kg)    BP Readings from Last 3 Encounters:  03/23/24 (!) 114/42  02/01/24 127/71  12/23/23 (!) 128/52     Physical Exam- Limited  Constitutional:  Body mass index is 37.32 kg/m. , not in acute distress, normal state of mind Eyes:  EOMI, no exophthalmos    CMP ( most recent) CMP     Component Value Date/Time   NA 132 (L) 09/17/2023 1135   NA 138 07/07/2023 1429   K 4.0 09/17/2023 1135   CL 98 09/17/2023 1135   CO2 21 (L) 09/17/2023 1135   GLUCOSE 506 (HH) 09/17/2023 1135   BUN 22 09/17/2023 1135   BUN 17 07/07/2023 1429   CREATININE 1.03 (H) 09/17/2023 1135   CALCIUM  10.2 09/17/2023 1135   PROT 7.1 09/17/2023 1135   PROT 7.2 07/07/2023 1429   ALBUMIN 3.8 09/17/2023 1135   ALBUMIN 4.2 07/07/2023 1429   AST 34 09/17/2023 1135   ALT 32 09/17/2023 1135   ALKPHOS 68 09/17/2023 1135   BILITOT 0.5 09/17/2023 1135   BILITOT 0.4 07/07/2023 1429   GFRNONAA 59 (L) 09/17/2023 1135   GFRAA >90 07/09/2012 1736   Lipid Panel     Component Value Date/Time   CHOL 138 06/11/2023  1620   TRIG 474 (H) 06/11/2023 1620   HDL 25 (L) 06/11/2023 1620   CHOLHDL 5.5 (H) 06/11/2023 1620   LDLCALC 44 06/11/2023 1620   LABVLDL 69 (H) 06/11/2023 1620    Recent Results (from the past 2160 hours)  POCT INR     Status: Abnormal   Collection Time: 12/29/23  2:47 PM  Result Value Ref Range   INR 1.9 (A) 2.0 - 3.0   POC INR    POCT INR      Status: Abnormal   Collection Time: 01/26/24  3:03 PM  Result Value Ref Range   INR 1.5 (A) 2.0 - 3.0   POC INR    Urinalysis, Routine w reflex microscopic     Status: Abnormal   Collection Time: 02/01/24  2:02 PM  Result Value Ref Range   Specific Gravity, UA 1.025 1.005 - 1.030   pH, UA 6.0 5.0 - 7.5   Color, UA Yellow Yellow   Appearance Ur Clear Clear   Leukocytes,UA Negative Negative   Protein,UA Negative Negative/Trace   Glucose, UA 3+ (A) Negative   Ketones, UA Negative Negative   RBC, UA Negative Negative   Bilirubin, UA Negative Negative   Urobilinogen, Ur 1.0 0.2 - 1.0 mg/dL   Nitrite, UA Negative Negative   Microscopic Examination Comment     Comment: Microscopic not indicated and not performed.  BLADDER SCAN AMB NON-IMAGING     Status: None   Collection Time: 02/01/24  2:08 PM  Result Value Ref Range   Scan Result 0   POCT INR     Status: Normal   Collection Time: 02/22/24  1:43 PM  Result Value Ref Range   INR 2.0 2.0 - 3.0   POC INR    POCT INR     Status: Normal   Collection Time: 03/15/24  2:01 PM  Result Value Ref Range   INR 2.8 2.0 - 3.0   POC INR    Lab report - scanned     Status: None   Collection Time: 03/17/24  1:32 PM  Result Value Ref Range   EGFR 60.3     Comment: Abstracted by HIM     Assessment & Plan:   1) DM type 2 causing vascular disease (HCC)  - Rachel Odonnell has currently uncontrolled symptomatic type 2 DM since  68 years of age.  She presents with her CGM, showing persistently above target glycemic profile with average of 238 mg per DL for the most recent 2 weeks.  Her AGP report shows 19% time in range, 45% Libre 1 hyperglycemia, 36% level 2 hyperglycemia.  Her point-of-care A1c is 9.5%, slightly improving from 11% from last measurement. She did not document any hypoglycemia.   Recent labs reviewed.  - I had a long discussion with her about the progressive nature of diabetes and the pathology behind its complications. -her  diabetes is complicated by coronary artery disease, peripheral neuropathy, obesity, sedentary life, nonalcoholic fatty liver disease and she remains at extremely  high risk for more acute and chronic complications which include CAD, CVA, CKD, retinopathy, and neuropathy. These are all discussed in detail with her.  - I discussed all available options of managing her diabetes including de-escalation of medications.  Considering her associated comorbidities including sleep apnea requiring CPAP, nonalcoholic fatty liver disease, hypertension, hyperlipidemia, she is a perfect candidate for lifestyle medicine.    I have counseled her on diet  and weight management  by adopting a  Whole Food , Plant Predominant  ( WFPP) nutrition as recommended by [employer] of Lifestyle Medicine. Patient is encouraged to switch to  unprocessed or minimally processed  complex starch, adequate protein intake (mainly plant source), minimal liquid fat ( mainly vegetable oils), plenty of fruits, and vegetables. -  she is advised to stick to a routine mealtimes to eat 3 complete meals a day and snack only when necessary ( to snack only to correct hypoglycemia BG <70 day time or <100 at night).   -Her engagement with lifestyle medicine nutrition is suboptimal, however,  she acknowledges that there is a room for improvement in her food and drink choices.  - she acknowledges that there is a room for improvement in her food and drink choices. - Suggestion is made for her to avoid simple carbohydrates  from her diet including Cakes, Sweet Desserts, Ice Cream, Soda (diet and regular), Sweet Tea, Candies, Chips, Cookies, Store Bought Juices, Alcohol  , Artificial Sweeteners,  Coffee Creamer, and Sugar-free Products, Lemonade. This will help patient to have more stable blood glucose profile and potentially avoid unintended weight gain.  The following Lifestyle Medicine recommendations according to American College of Lifestyle  Medicine  [facility]) were discussed and and offered to patient and she  agrees to start the journey:  A. Whole Foods, Plant-Based Nutrition comprising of fruits and vegetables, plant-based proteins, whole-grain carbohydrates was discussed in detail with the patient.   A list for source of those nutrients were also provided to the patient.  Patient will use only water or unsweetened tea for hydration. B.  The need to stay away from risky substances including alcohol , smoking; obtaining 7 to 9 hours of restorative sleep, at least 150 minutes of moderate intensity exercise weekly, the importance of healthy social connections,  and stress management techniques were discussed. C.  A full color page of  Calorie density of various food groups per pound showing examples of each food groups was provided to the patient.   - she has been scheduled with Penny Crumpton, RDN, CDE for individualized diabetes education.  - I have approached her with the following plan to manage  her diabetes and patient agrees:   - In light of her chronic, persistent hyperglycemic profile, she will need intensive treatment with basal/bolus insulin  in order for her to achieve control of diabetes to target.    -She remains at exceedingly high risk for cardiovascular complications of diabetes.    - Admittedly, she has been skipping some of her prandial insulin  injection opportunities and did not Tresiba  at prescribed dose.    - I advised her to increase Tresiba  to 110 units nightly, advised to increase NovoLog  to 16-22 units 3 times daily is for Premeal blood glucose readings above 90 mg per DL.   - She requested that her Humalog  to be switched to NovoLog  due to insurance coverage issues. She is advised to continue metformin  500 mg p.o. daily. - She has tolerated Jardiance , discussed and increase Jardiance  to 25 mg p.o. daily at breakfast.   She did not afford the co-pays of GLP-1 super agonist.  She received samples of Ozempic  to  continue 0.5 mg subcutaneously weekly.   She is encouraged to call clinic for hypoglycemia under 70 or hyperglycemia above 200 mg per DL of weekly average.  - Specific targets for  A1c;  LDL, HDL,  and Triglycerides were discussed with the patient.  2) Blood Pressure /Hypertension:   -Her blood pressure is controlled to target.  she is advised to continue her current medications including Imdur  60 mg p.o. daily with breakfast .  3) Lipids/Hyperlipidemia:    Her most recent lipid panel showed improved LDL to 44 from 120.  She is advised to continue her multidrug modality including fenofibrate  160 mg p.o. daily, Crestor  10 g p.o. nightly .    She is hesitant to engage with lifestyle medicine for now.  Side effects and precautions discussed with her.  4)  Weight/Diet:   Her Body mass index is 37.32 kg/m.  -   clearly complicating her diabetes care.   she is  a candidate for weight loss. I discussed with her the fact that loss of 5 - 10% of her  current body weight will have the most impact on her diabetes management.  The above detailed  ACLM recommendations for nutrition, exercise, sleep, social life, avoidance of risky substances, the need for restorative sleep   information will also detailed on discharge instructions.  5) Hypothyroidism:  The circumstances of her diagnosis are not available to review.  Her recent labs were consistent with appropriate replacement.  She is advised to continue levothyroxine  50 mcg p.o. daily before breakfast.     - We discussed about the correct intake of her thyroid  hormone, on empty stomach at fasting, with water, separated by at least 30 minutes from breakfast and other medications,  and separated by more than 4 hours from calcium , iron, multivitamins, acid reflux medications (PPIs). -Patient is made aware of the fact that thyroid  hormone replacement is needed for life, dose to be adjusted by periodic monitoring of thyroid  function tests.   6) Chronic  Care/Health Maintenance: -she is on Statin medications and  is encouraged to initiate and continue to follow up with Ophthalmology, Dentist,  Podiatrist at least yearly or according to recommendations, and advised to   stay away from smoking. I have recommended yearly flu vaccine and pneumonia vaccine at least every 5 years; moderate intensity exercise for up to 150 minutes weekly; and  sleep for 7- 9 hours a day.  -She will need to have vitamin D  supplement with vitamin D3 5000 units daily x90 days. She had normal diabetic foot exam today March 23, 2024. - she is advised to maintain close follow up with Skillman, Katherine E, PA-C for primary care needs, as well as her other providers for optimal and coordinated care.   I spent  42  minutes in the care of the patient today including review of labs from CMP, Lipids, Thyroid  Function, Hematology (current and previous including abstractions from other facilities); face-to-face time discussing  her blood glucose readings/logs, discussing hypoglycemia and hyperglycemia episodes and symptoms, medications doses, her options of short and long term treatment based on the latest standards of care / guidelines;  discussion about incorporating lifestyle medicine;  and documenting the encounter. Risk reduction counseling performed per USPSTF guidelines to reduce  obesity and cardiovascular risk factors.     Please refer to Patient Instructions for Blood Glucose Monitoring and Insulin /Medications Dosing Guide  in media tab for additional information. Please  also refer to  Patient Self Inventory in the Media  tab for reviewed elements of pertinent patient history.  Rachel Odonnell participated in the discussions, expressed understanding, and voiced agreement with the above plans.  All questions were answered to her satisfaction. she is encouraged to contact clinic should she have any questions or concerns prior to her return visit.  Follow up plan: - Return in  about  4 months (around 07/23/2024) for F/U with Pre-visit Labs, Meter/CGM/Logs, A1c here.  [place], [facility] [address] [place], [place] [address] Phone: [phone] Fax: [phone]  03/23/2024, 2:22 PM

## 2024-03-23 NOTE — Telephone Encounter (Signed)
 Needing Gemtesa  samples.

## 2024-03-23 NOTE — Patient Instructions (Signed)

## 2024-03-23 NOTE — Telephone Encounter (Signed)
 Pt given samples in office

## 2024-03-27 ENCOUNTER — Other Ambulatory Visit: Payer: Self-pay

## 2024-03-27 DIAGNOSIS — E1159 Type 2 diabetes mellitus with other circulatory complications: Secondary | ICD-10-CM

## 2024-03-27 MED ORDER — INSULIN LISPRO (1 UNIT DIAL) 100 UNIT/ML (KWIKPEN): 16.0000 [IU] | PEN_INJECTOR | Freq: Three times a day (TID) | SUBCUTANEOUS | 0 refills | Status: DC

## 2024-03-27 NOTE — Telephone Encounter (Signed)
 Pt requested Rx for Jardiance  25mg  be sent [facility] due to being on patient assistance program. [employer], spoke with representative which stated the pt assistance form pt had filled out had a different providers name on and pt would need to fill out a different application in order for us  to make any changes to her Jardiance . Representative stated they would reach out to pt making her aware.

## 2024-04-09 ENCOUNTER — Ambulatory Visit: Payer: Self-pay | Admitting: Nurse Practitioner

## 2024-04-12 ENCOUNTER — Encounter: Attending: Cardiology | Admitting: *Deleted

## 2024-04-12 DIAGNOSIS — Z5181 Encounter for therapeutic drug level monitoring: Secondary | ICD-10-CM

## 2024-04-12 DIAGNOSIS — I4821 Permanent atrial fibrillation: Secondary | ICD-10-CM

## 2024-04-12 LAB — POCT INR: INR: 2.6 (ref 2.0–3.0)

## 2024-04-12 NOTE — Patient Instructions (Signed)
Continue warfarin 1 tablet daily except 1/2 tablet on Mondays and Thursdays Recheck INR in 6 weeks.  

## 2024-04-14 DIAGNOSIS — E782 Mixed hyperlipidemia: Secondary | ICD-10-CM | POA: Diagnosis not present

## 2024-04-14 DIAGNOSIS — E1165 Type 2 diabetes mellitus with hyperglycemia: Secondary | ICD-10-CM | POA: Diagnosis not present

## 2024-04-20 DIAGNOSIS — E1165 Type 2 diabetes mellitus with hyperglycemia: Secondary | ICD-10-CM | POA: Diagnosis not present

## 2024-04-20 DIAGNOSIS — Z0000 Encounter for general adult medical examination without abnormal findings: Secondary | ICD-10-CM | POA: Diagnosis not present

## 2024-04-20 DIAGNOSIS — I4891 Unspecified atrial fibrillation: Secondary | ICD-10-CM | POA: Diagnosis not present

## 2024-04-20 DIAGNOSIS — R6884 Jaw pain: Secondary | ICD-10-CM | POA: Diagnosis not present

## 2024-04-20 DIAGNOSIS — R079 Chest pain, unspecified: Secondary | ICD-10-CM | POA: Diagnosis not present

## 2024-04-20 DIAGNOSIS — Z0001 Encounter for general adult medical examination with abnormal findings: Secondary | ICD-10-CM | POA: Diagnosis not present

## 2024-04-20 DIAGNOSIS — Z1389 Encounter for screening for other disorder: Secondary | ICD-10-CM | POA: Diagnosis not present

## 2024-04-25 NOTE — Progress Notes (Signed)
 INR 2.6; Please see anticoagulation encounter

## 2024-04-30 ENCOUNTER — Other Ambulatory Visit: Payer: Self-pay | Admitting: Cardiology

## 2024-04-30 DIAGNOSIS — J449 Chronic obstructive pulmonary disease, unspecified: Secondary | ICD-10-CM | POA: Insufficient documentation

## 2024-04-30 DIAGNOSIS — I4891 Unspecified atrial fibrillation: Secondary | ICD-10-CM

## 2024-04-30 NOTE — Progress Notes (Deleted)
 Rachel Odonnell, female    DOB: 05/19/1956    MRN: [id number]   Brief patient profile:  75  yowf *** [place] pt referred to pulmonary clinic in [place]  05/02/2024 by *** for ***   CTA chest 02/2021 mild coronary artery calcification, mild paraseptal emphysema  - non obs CAD 05/2021 LHC/RHC nml cors, PA pressures PFTs 07/2021 mild restriction, ratio 0.82, FEV1 84%, FVC 78%, DLCO 75%   History of Present Illness  05/02/2024  Pulmonary/ 1st office eval/ [name]  No chief complaint on file.    Dyspnea:  *** Cough: *** Sleep: *** SABA use: *** 02: *** LDSCT:***  No obvious day to day or daytime pattern/variability or assoc excess/ purulent sputum or mucus plugs or hemoptysis or cp or chest tightness, subjective wheeze or overt sinus or hb symptoms.    Also denies any obvious fluctuation of symptoms with weather or environmental changes or other aggravating or alleviating factors except as outlined above   No unusual exposure hx or h/o childhood pna/ asthma or knowledge of premature birth.  Current Allergies, Complete Past Medical History, Past Surgical History, Family History, and Social History were reviewed in [employer] record.  ROS  The following are not active complaints unless bolded Hoarseness, sore throat, dysphagia, dental problems, itching, sneezing,  nasal congestion or discharge of excess mucus or purulent secretions, ear ache,   fever, chills, sweats, unintended wt loss or wt gain, classically pleuritic or exertional cp,  orthopnea pnd or arm/hand swelling  or leg swelling, presyncope, palpitations, abdominal pain, anorexia, nausea, vomiting, diarrhea  or change in bowel habits or change in bladder habits, change in stools or change in urine, dysuria, hematuria,  rash, arthralgias, visual complaints, headache, numbness, weakness or ataxia or problems with walking or coordination,  change in mood or  memory.            Outpatient  Medications Prior to Visit  Medication Sig Dispense Refill   Accu-Chek Softclix Lancets lancets Use as instructed to test blood glucose four times daily 100 each 2   busPIRone  (BUSPAR ) 5 MG tablet Take 5 mg by mouth at bedtime.     Cholecalciferol  (VITAMIN D3) 125 MCG (5000 UT) CAPS Take 1 capsule (5,000 Units total) by mouth daily. 90 capsule 0   citalopram  (CELEXA ) 20 MG tablet Take 1 tablet (20 mg total) by mouth every evening. 30 tablet 4   clopidogrel  (PLAVIX ) 75 MG tablet TAKE 1 TABLET BY MOUTH DAILY 100 tablet 2   Continuous Glucose Receiver (FREESTYLE LIBRE 2 READER) DEVI USE TO CHECK GLUCOSE AS DIRECTED 1 each 0   Continuous Glucose Sensor (FREESTYLE LIBRE 2 SENSOR) MISC CHANGE SENSOR EVERY 14 DAYS USE  TO CHECK BLOOD GLUCOSE  CONTINUOUSLY 6 each 3   donepezil  (ARICEPT ) 5 MG tablet Take 5 mg by mouth every evening.     empagliflozin  (JARDIANCE ) 25 MG TABS tablet Take 1 tablet (25 mg total) by mouth daily. 90 tablet 1   estradiol  (ESTRACE ) 0.1 MG/GM vaginal cream Discard plastic applicator. Insert a blueberry size amount (approximately 1 gram) of cream on fingertip inside vagina at bedtime every night for 1 week then every other night. For long term use. 30 g 3   estradiol  (ESTRACE ) 2 MG tablet Take 1 mg by mouth daily.     fenofibrate  160 MG tablet Take 1 tablet (160 mg total) by mouth daily. 90 tablet 1   gabapentin  (NEURONTIN ) 100 MG capsule Take 1 capsule (  100 mg total) by mouth at bedtime.     glucose blood test strip 1 each by Other route as needed. Use as instructed to test blood glucose four times daily 100 each 2   indapamide  (LOZOL ) 2.5 MG tablet Take 1 tablet (2.5 mg total) by mouth daily. 90 tablet 3   insulin  degludec (TRESIBA  FLEXTOUCH) 200 UNIT/ML FlexTouch Pen Inject 110 Units into the skin at bedtime. 24 mL 2   insulin  lispro (HUMALOG  KWIKPEN) 100 UNIT/ML KwikPen Inject 16-22 Units into the skin 3 (three) times daily before meals. 60 mL 0   Insulin  Pen Needle (B-D  ULTRAFINE III SHORT PEN) 31G X 8 MM MISC 1 each by Does not apply route as directed. 100 each 2   isosorbide  mononitrate (IMDUR ) 60 MG 24 hr tablet Take 1.5 tablets (90 mg total) by mouth daily. Can take an additional half tab as needed for chest pain 135 tablet 1   levocetirizine (XYZAL ) 5 MG tablet Take 5 mg by mouth every evening.     levothyroxine  (SYNTHROID ) 50 MCG tablet TAKE 1 TABLET BY MOUTH DAILY  BEFORE BREAKFAST 90 tablet 3   Magnesium  Oxide (MAGNESIUM  OXIDE 400) 240 MG PACK Take 400 mg by mouth daily.     metFORMIN  (GLUCOPHAGE ) 500 MG tablet TAKE 1 TABLET BY MOUTH DAILY  WITH BREAKFAST 90 tablet 3   modafinil  (PROVIGIL ) 100 MG tablet Take 1 tablet (100 mg total) by mouth daily. 90 tablet 0   montelukast  (SINGULAIR ) 10 MG tablet Take 10 mg by mouth at bedtime.     Multiple Vitamin (MULTIVITAMIN ADULT PO) Take 1 tablet by mouth daily.     nitroGLYCERIN  (NITROSTAT ) 0.4 MG SL tablet Place 1 tablet (0.4 mg total) under the tongue every 5 (five) minutes as needed for chest pain. 25 tablet 3   OVER THE COUNTER MEDICATION Take 2 capsules by mouth daily. Omega XL     pantoprazole  (PROTONIX ) 40 MG tablet TAKE 2 TABLETS EVERY DAY 180 tablet 1   rOPINIRole  (REQUIP ) 2 MG tablet Take 2 mg by mouth at bedtime.     rosuvastatin  (CRESTOR ) 40 MG tablet Take 1 tablet (40 mg total) by mouth daily. 30 tablet 5   Semaglutide ,0.25 or 0.5MG /DOS, (OZEMPIC , 0.25 OR 0.5 MG/DOSE,) 2 MG/1.5ML SOPN Inject 0.5 mg into the skin once a week.     Vibegron  (GEMTESA ) 75 MG TABS Take 1 tablet (75 mg total) by mouth daily.     Vibegron  (GEMTESA ) 75 MG TABS Take 1 tablet (75 mg total) by mouth daily.     Vibegron  (GEMTESA ) 75 MG TABS Take 1 tablet (75 mg total) by mouth daily. 30 tablet 11   warfarin (COUMADIN ) 5 MG tablet TAKE 1/2 TO 1 TABLET BY MOUTH DAILY AS DIRECTED BY THE COUMADIN  CLINIC. 90 tablet 1   No facility-administered medications prior to visit.    Past Medical History:  Diagnosis Date   ADD (attention  deficit disorder)    Diabetes mellitus without complication (HCC)    Hypertension       Objective:     There were no vitals taken for this visit.         Assessment

## 2024-05-01 ENCOUNTER — Inpatient Hospital Stay (HOSPITAL_COMMUNITY)
Admission: EM | Admit: 2024-05-01 | Discharge: 2024-05-13 | DRG: 233 | Disposition: A | Discharge status: Home Health | Attending: Thoracic Surgery (Cardiothoracic Vascular Surgery) | Admitting: Thoracic Surgery (Cardiothoracic Vascular Surgery)

## 2024-05-01 ENCOUNTER — Other Ambulatory Visit: Payer: Self-pay

## 2024-05-01 ENCOUNTER — Encounter (HOSPITAL_COMMUNITY): Payer: Self-pay | Admitting: Emergency Medicine

## 2024-05-01 ENCOUNTER — Telehealth: Payer: Self-pay | Admitting: Cardiology

## 2024-05-01 ENCOUNTER — Emergency Department (HOSPITAL_COMMUNITY)

## 2024-05-01 DIAGNOSIS — F419 Anxiety disorder, unspecified: Secondary | ICD-10-CM | POA: Diagnosis present

## 2024-05-01 DIAGNOSIS — E1165 Type 2 diabetes mellitus with hyperglycemia: Secondary | ICD-10-CM | POA: Diagnosis present

## 2024-05-01 DIAGNOSIS — I350 Nonrheumatic aortic (valve) stenosis: Secondary | ICD-10-CM | POA: Diagnosis present

## 2024-05-01 DIAGNOSIS — E66812 Obesity, class 2: Secondary | ICD-10-CM | POA: Diagnosis present

## 2024-05-01 DIAGNOSIS — Z7902 Long term (current) use of antithrombotics/antiplatelets: Secondary | ICD-10-CM

## 2024-05-01 DIAGNOSIS — Y831 Surgical operation with implant of artificial internal device as the cause of abnormal reaction of the patient, or of later complication, without mention of misadventure at the time of the procedure: Secondary | ICD-10-CM | POA: Diagnosis present

## 2024-05-01 DIAGNOSIS — Z833 Family history of diabetes mellitus: Secondary | ICD-10-CM

## 2024-05-01 DIAGNOSIS — I214 Non-ST elevation (NSTEMI) myocardial infarction: Principal | ICD-10-CM

## 2024-05-01 DIAGNOSIS — D62 Acute posthemorrhagic anemia: Secondary | ICD-10-CM | POA: Diagnosis not present

## 2024-05-01 DIAGNOSIS — Z87891 Personal history of nicotine dependence: Secondary | ICD-10-CM

## 2024-05-01 DIAGNOSIS — I252 Old myocardial infarction: Secondary | ICD-10-CM

## 2024-05-01 DIAGNOSIS — F909 Attention-deficit hyperactivity disorder, unspecified type: Secondary | ICD-10-CM | POA: Diagnosis present

## 2024-05-01 DIAGNOSIS — N179 Acute kidney failure, unspecified: Secondary | ICD-10-CM | POA: Diagnosis present

## 2024-05-01 DIAGNOSIS — Z91199 Patient's noncompliance with other medical treatment and regimen due to unspecified reason: Secondary | ICD-10-CM

## 2024-05-01 DIAGNOSIS — R0902 Hypoxemia: Secondary | ICD-10-CM | POA: Diagnosis not present

## 2024-05-01 DIAGNOSIS — G4733 Obstructive sleep apnea (adult) (pediatric): Secondary | ICD-10-CM | POA: Diagnosis not present

## 2024-05-01 DIAGNOSIS — K219 Gastro-esophageal reflux disease without esophagitis: Secondary | ICD-10-CM | POA: Diagnosis present

## 2024-05-01 DIAGNOSIS — Z79890 Hormone replacement therapy (postmenopausal): Secondary | ICD-10-CM

## 2024-05-01 DIAGNOSIS — I4891 Unspecified atrial fibrillation: Secondary | ICD-10-CM

## 2024-05-01 DIAGNOSIS — I249 Acute ischemic heart disease, unspecified: Secondary | ICD-10-CM | POA: Diagnosis not present

## 2024-05-01 DIAGNOSIS — G2581 Restless legs syndrome: Secondary | ICD-10-CM | POA: Diagnosis present

## 2024-05-01 DIAGNOSIS — I2510 Atherosclerotic heart disease of native coronary artery without angina pectoris: Secondary | ICD-10-CM | POA: Diagnosis present

## 2024-05-01 DIAGNOSIS — E1142 Type 2 diabetes mellitus with diabetic polyneuropathy: Secondary | ICD-10-CM | POA: Diagnosis present

## 2024-05-01 DIAGNOSIS — Z7901 Long term (current) use of anticoagulants: Secondary | ICD-10-CM | POA: Diagnosis not present

## 2024-05-01 DIAGNOSIS — I10 Essential (primary) hypertension: Secondary | ICD-10-CM | POA: Diagnosis not present

## 2024-05-01 DIAGNOSIS — E785 Hyperlipidemia, unspecified: Secondary | ICD-10-CM | POA: Insufficient documentation

## 2024-05-01 DIAGNOSIS — D72829 Elevated white blood cell count, unspecified: Secondary | ICD-10-CM | POA: Diagnosis not present

## 2024-05-01 DIAGNOSIS — I951 Orthostatic hypotension: Secondary | ICD-10-CM | POA: Diagnosis not present

## 2024-05-01 DIAGNOSIS — I21A9 Other myocardial infarction type: Secondary | ICD-10-CM | POA: Diagnosis present

## 2024-05-01 DIAGNOSIS — Z825 Family history of asthma and other chronic lower respiratory diseases: Secondary | ICD-10-CM

## 2024-05-01 DIAGNOSIS — R5082 Postprocedural fever: Secondary | ICD-10-CM | POA: Diagnosis not present

## 2024-05-01 DIAGNOSIS — I480 Paroxysmal atrial fibrillation: Secondary | ICD-10-CM | POA: Diagnosis present

## 2024-05-01 DIAGNOSIS — E782 Mixed hyperlipidemia: Secondary | ICD-10-CM | POA: Diagnosis present

## 2024-05-01 DIAGNOSIS — Z885 Allergy status to narcotic agent status: Secondary | ICD-10-CM

## 2024-05-01 DIAGNOSIS — E8729 Other acidosis: Secondary | ICD-10-CM | POA: Diagnosis not present

## 2024-05-01 DIAGNOSIS — Z794 Long term (current) use of insulin: Secondary | ICD-10-CM

## 2024-05-01 DIAGNOSIS — F32A Depression, unspecified: Secondary | ICD-10-CM | POA: Diagnosis present

## 2024-05-01 DIAGNOSIS — Z7984 Long term (current) use of oral hypoglycemic drugs: Secondary | ICD-10-CM

## 2024-05-01 DIAGNOSIS — E781 Pure hyperglyceridemia: Secondary | ICD-10-CM

## 2024-05-01 DIAGNOSIS — J309 Allergic rhinitis, unspecified: Secondary | ICD-10-CM | POA: Diagnosis present

## 2024-05-01 DIAGNOSIS — I272 Pulmonary hypertension, unspecified: Secondary | ICD-10-CM | POA: Diagnosis not present

## 2024-05-01 DIAGNOSIS — Z881 Allergy status to other antibiotic agents status: Secondary | ICD-10-CM

## 2024-05-01 DIAGNOSIS — Z951 Presence of aortocoronary bypass graft: Secondary | ICD-10-CM

## 2024-05-01 DIAGNOSIS — E871 Hypo-osmolality and hyponatremia: Secondary | ICD-10-CM | POA: Diagnosis not present

## 2024-05-01 DIAGNOSIS — Z7982 Long term (current) use of aspirin: Secondary | ICD-10-CM

## 2024-05-01 DIAGNOSIS — G8918 Other acute postprocedural pain: Secondary | ICD-10-CM | POA: Diagnosis not present

## 2024-05-01 DIAGNOSIS — R791 Abnormal coagulation profile: Secondary | ICD-10-CM | POA: Diagnosis present

## 2024-05-01 DIAGNOSIS — Z888 Allergy status to other drugs, medicaments and biological substances status: Secondary | ICD-10-CM

## 2024-05-01 DIAGNOSIS — I440 Atrioventricular block, first degree: Secondary | ICD-10-CM | POA: Diagnosis present

## 2024-05-01 DIAGNOSIS — Z7985 Long-term (current) use of injectable non-insulin antidiabetic drugs: Secondary | ICD-10-CM

## 2024-05-01 DIAGNOSIS — E039 Hypothyroidism, unspecified: Secondary | ICD-10-CM | POA: Diagnosis present

## 2024-05-01 DIAGNOSIS — Z6837 Body mass index (BMI) 37.0-37.9, adult: Secondary | ICD-10-CM

## 2024-05-01 DIAGNOSIS — T82855A Stenosis of coronary artery stent, initial encounter: Secondary | ICD-10-CM | POA: Diagnosis not present

## 2024-05-01 DIAGNOSIS — Z8249 Family history of ischemic heart disease and other diseases of the circulatory system: Secondary | ICD-10-CM

## 2024-05-01 DIAGNOSIS — R079 Chest pain, unspecified: Secondary | ICD-10-CM | POA: Diagnosis not present

## 2024-05-01 DIAGNOSIS — Z79899 Other long term (current) drug therapy: Secondary | ICD-10-CM

## 2024-05-01 DIAGNOSIS — N3281 Overactive bladder: Secondary | ICD-10-CM | POA: Diagnosis present

## 2024-05-01 DIAGNOSIS — Z90710 Acquired absence of both cervix and uterus: Secondary | ICD-10-CM

## 2024-05-01 LAB — CBC
HCT: 43.4 % (ref 36.0–46.0)
Hemoglobin: 14.5 g/dL (ref 12.0–15.0)
MCH: 30.7 pg (ref 26.0–34.0)
MCHC: 33.4 g/dL (ref 30.0–36.0)
MCV: 91.9 fL (ref 80.0–100.0)
Platelets: 279 K/uL (ref 150–400)
RBC: 4.72 MIL/uL (ref 3.87–5.11)
RDW: 13.2 % (ref 11.5–15.5)
WBC: 9.1 K/uL (ref 4.0–10.5)
nRBC: 0 % (ref 0.0–0.2)

## 2024-05-01 MED ORDER — NITROGLYCERIN 0.4 MG SL SUBL: 0.4000 mg | SUBLINGUAL_TABLET | SUBLINGUAL | 1 refills | Status: DC | PRN

## 2024-05-01 MED ORDER — ONDANSETRON HCL 4 MG/2ML IJ SOLN
4.0000 mg | Freq: Once | INTRAMUSCULAR | Status: AC
  Administered 2024-05-02: 4 mg via INTRAVENOUS
  Filled 2024-05-01: qty 2

## 2024-05-01 MED ORDER — MORPHINE SULFATE (PF) 4 MG/ML IV SOLN
4.0000 mg | Freq: Once | INTRAVENOUS | Status: AC
  Administered 2024-05-02: 4 mg via INTRAVENOUS
  Filled 2024-05-01: qty 1

## 2024-05-01 MED ORDER — NITROGLYCERIN 0.4 MG SL SUBL
0.4000 mg | SUBLINGUAL_TABLET | Freq: Once | SUBLINGUAL | Status: AC
  Administered 2024-05-02: 0.4 mg via SUBLINGUAL
  Filled 2024-05-01: qty 1

## 2024-05-01 NOTE — ED Triage Notes (Signed)
 Pt c/o intermittent chest pain for days with sob, jaw pain and arm pit pain.

## 2024-05-01 NOTE — ED Provider Notes (Signed)
 [facility]-EMERGENCY DEPT [facility] Emergency Department Provider Note MRN:  [id number]  Arrival date & time: 05/02/24     Chief Complaint   Chest Pain   History of Present Illness   Rachel Odonnell is a 68 y.o. year-old female with a history of CAD presenting to the ED with chief complaint of chest pain.  Chest pain with radiation to the left axillar, also bilateral jaw.  On and off for the past few days.  Associated with shortness of breath.  Worse tonight.  Review of Systems  A thorough review of systems was obtained and all systems are negative except as noted in the HPI and PMH.   Patient's Health History    Past Medical History:  Diagnosis Date   ADD (attention deficit disorder)    Diabetes mellitus without complication (HCC)    Hypertension     Past Surgical History:  Procedure Laterality Date   ABDOMINAL HYSTERECTOMY     COLONOSCOPY  02/2022   CORONARY PRESSURE/FFR STUDY N/A 04/16/2023   Procedure: CORONARY PRESSURE/FFR STUDY;  Surgeon: Swaziland, Peter M, MD;  Location: [facility] INVASIVE CV LAB;  Service: Cardiovascular;  Laterality: N/A;   CORONARY STENT INTERVENTION N/A 04/16/2023   Procedure: CORONARY STENT INTERVENTION;  Surgeon: Swaziland, Peter M, MD;  Location: [facility] INVASIVE CV LAB;  Service: Cardiovascular;  Laterality: N/A;   CORONARY ULTRASOUND/IVUS N/A 04/16/2023   Procedure: Coronary Ultrasound/IVUS;  Surgeon: Swaziland, Peter M, MD;  Location: [facility] INVASIVE CV LAB;  Service: Cardiovascular;  Laterality: N/A;   EXCISIONAL HEMORRHOIDECTOMY     INCONTINENCE SURGERY     RIGHT/LEFT HEART CATH AND CORONARY ANGIOGRAPHY N/A 06/13/2021   Procedure: RIGHT/LEFT HEART CATH AND CORONARY ANGIOGRAPHY;  Surgeon: Swaziland, Peter M, MD;  Location: [facility] INVASIVE CV LAB;  Service: Cardiovascular;  Laterality: N/A;   RIGHT/LEFT HEART CATH AND CORONARY ANGIOGRAPHY N/A 04/16/2023   Procedure: RIGHT/LEFT HEART CATH AND CORONARY ANGIOGRAPHY;  Surgeon: Swaziland, Peter M, MD;  Location: [facility] INVASIVE CV LAB;   Service: Cardiovascular;  Laterality: N/A;    Family History  Problem Relation Age of Onset   Cancer Mother    Diabetes Father    Heart Problems Father    COPD Father    Emphysema Father    Heart block Father    Congestive Heart Failure Father     Social History   Socioeconomic History   Marital status: Married    Spouse name: Meribeth   Number of children: Not on file   Years of education: Not on file   Highest education level: Not on file  Occupational History   Not on file  Tobacco Use   Smoking status: Former    Current packs/day: 0.00    Types: Cigarettes    Quit date: 08/17/1994    Years since quitting: 29.7   Smokeless tobacco: Never  Vaping Use   Vaping status: Never Used  Substance and Sexual Activity   Alcohol  use: No   Drug use: No   Sexual activity: Yes  Other Topics Concern   Not on file  Social History Narrative   Lives at home with husband, Meribeth   Former smoker Smoked 2 ppd x 25 yrs; Quit in 1996   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, retail  retired Secondary school teacher, still works part-time at the [employer], used to work Editor, commissioning   Now on fixed income from her SSI and pension   Social Drivers of Corporate investment banker Strain: Low Risk  (  10/10/2021)   Overall Financial Resource Strain (CARDIA)    Difficulty of Paying Living Expenses: Not hard at all  Food Insecurity: No Food Insecurity (04/16/2023)   Hunger Vital Sign    Worried About Running Out of Food in the Last Year: Never true    Ran Out of Food in the Last Year: Never true  Transportation Needs: No Transportation Needs (04/16/2023)   PRAPARE - Administrator, Civil Service (Medical): No    Lack of Transportation (Non-Medical): No  Physical Activity: Inactive (04/08/2023)   Received from [facility]   Exercise Vital Sign    On average, how many days per week do you engage in moderate to strenuous exercise (like a brisk walk)?: 0 days    On  average, how many minutes do you engage in exercise at this level?: 0 min  Stress: No Stress Concern Present (10/10/2021)   [employer] of Occupational Health - Occupational Stress Questionnaire    Feeling of Stress : Only a little  Social Connections: Socially Integrated (10/10/2021)   Social Connection and Isolation Panel    Frequency of Communication with Friends and Family: More than three times a week    Frequency of Social Gatherings with Friends and Family: More than three times a week    Attends Religious Services: More than 4 times per year    Active Member of [employer] or Organizations: Yes    Attends Engineer, structural: More than 4 times per year    Marital Status: Married  Catering manager Violence: Not At Risk (04/16/2023)   Humiliation, Afraid, Rape, and Kick questionnaire    Fear of Current or Ex-Partner: No    Emotionally Abused: No    Physically Abused: No    Sexually Abused: No     Physical Exam   Vitals:   05/01/24 2353 05/02/24 0045  BP:  (!) 115/51  Pulse:  (!) 51  Resp:  13  Temp:    SpO2: 99% 95%    CONSTITUTIONAL: Well-appearing, NAD NEURO/PSYCH:  Alert and oriented x 3, no focal deficits EYES:  eyes equal and reactive ENT/NECK:  no LAD, no JVD CARDIO: Regular rate, well-perfused, normal S1 and S2 PULM:  CTAB no wheezing or rhonchi GI/GU:  non-distended, non-tender MSK/SPINE:  No gross deformities, no edema SKIN:  no rash, atraumatic   *Additional and/or pertinent findings included in MDM below  Diagnostic and Interventional Summary    EKG Interpretation Date/Time:  May 01, 2024 at 23:26:28 Ventricular Rate:  54  PR Interval:   214 QRS Duration:   93 QT Interval:   554 QTC Calculation:  526 R Axis:      Text Interpretation: Sinus rhythm, ST depressions noted       Labs Reviewed  BASIC METABOLIC PANEL WITH GFR - Abnormal; Notable for the following components:      Result Value   Sodium 133 (*)    Chloride 97 (*)     Glucose, Bld 380 (*)    BUN 28 (*)    Creatinine, Ser 1.25 (*)    GFR, Estimated 47 (*)    All other components within normal limits  TROPONIN I (HIGH SENSITIVITY) - Abnormal; Notable for the following components:   Troponin I (High Sensitivity) 33 (*)    All other components within normal limits  TROPONIN I (HIGH SENSITIVITY) - Abnormal; Notable for the following components:   Troponin I (High Sensitivity) 47 (*)    All other components within  normal limits  CBC    DG Chest Port 1 View  Final Result      Medications  morphine  (PF) 4 MG/ML injection 4 mg (4 mg Intravenous Given 05/02/24 0024)  ondansetron  (ZOFRAN ) injection 4 mg (4 mg Intravenous Given 05/02/24 0024)  nitroGLYCERIN  (NITROSTAT ) SL tablet 0.4 mg (0.4 mg Sublingual Given 05/02/24 0025)     Procedures  /  Critical Care .Critical Care  Performed by: Theadore Ozell HERO, MD Authorized by: Theadore Ozell HERO, MD   Critical care provider statement:    Critical care time (minutes):  45   Critical care was necessary to treat or prevent imminent or life-threatening deterioration of the following conditions: NSTEMI.   Critical care was time spent personally by me on the following activities:  Development of treatment plan with patient or surrogate, discussions with consultants, evaluation of patient's response to treatment, examination of patient, ordering and review of laboratory studies, ordering and review of radiographic studies, ordering and performing treatments and interventions, pulse oximetry, re-evaluation of patient's condition and review of old charts   ED Course and Medical Decision Making  Initial Impression and Ddx Patient appears uncomfortable, having chest pain concerning for ACS given her history and description.  Providing nitro, morphine .  EKG showing some possible new ST depressions  Past medical/surgical history that increases complexity of ED encounter: CAD  Interpretation of Diagnostics I personally  reviewed the EKG and my interpretation is as follows: Sinus rhythm with ST depressions  No significant blood count or electrolyte disturbance.  Troponin mildly elevated and rising upon repeat  Patient Reassessment and Ultimate Disposition/Management     Case discussed with Dr. Duffy of cardiology, plan is for admission to [facility] under hospital service with cardiology following..  Starting heparin .  Patient management required discussion with the following services or consulting groups:  Hospitalist Service and Cardiology  Complexity of Problems Addressed Acute illness or injury that poses threat of life of bodily function  Additional Data Reviewed and Analyzed Further history obtained from: Further history from spouse/family member  Additional Factors Impacting ED Encounter Risk Consideration of hospitalization  Ozell HERO. Theadore, MD [facility] Emergency Medicine [facility] [email or web address]  Final Clinical Impressions(s) / ED Diagnoses     ICD-10-CM   1. NSTEMI (non-ST elevated myocardial infarction) [facility])  I21.4       ED Discharge Orders     None        Discharge Instructions Discussed with and Provided to Patient:   Discharge Instructions   None      Theadore Ozell HERO, MD 05/02/24 [phone]

## 2024-05-01 NOTE — Telephone Encounter (Signed)
 Pt's medication was sent to pt's pharmacy as requested. Confirmation received.

## 2024-05-01 NOTE — Telephone Encounter (Signed)
*  STAT* If patient is at the pharmacy, call can be transferred to refill team.   1. Which medications need to be refilled? (please list name of each medication and dose if known)   nitroGLYCERIN  (NITROSTAT ) 0.4 MG SL tablet    2. Which pharmacy/location (including street and city if local pharmacy) is medication to be sent to?  [facility] - [place], [place] - [address]    3. Do they need a 30 day or 90 day supply? 90

## 2024-05-02 ENCOUNTER — Encounter (HOSPITAL_COMMUNITY)
Admission: EM | Disposition: A | Discharge status: Home Health | Payer: Self-pay | Source: Home / Self Care | Attending: Thoracic Surgery (Cardiothoracic Vascular Surgery)

## 2024-05-02 ENCOUNTER — Other Ambulatory Visit: Payer: Self-pay

## 2024-05-02 ENCOUNTER — Other Ambulatory Visit (HOSPITAL_COMMUNITY): Payer: Self-pay | Admitting: *Deleted

## 2024-05-02 ENCOUNTER — Inpatient Hospital Stay (HOSPITAL_COMMUNITY)

## 2024-05-02 ENCOUNTER — Ambulatory Visit: Admitting: Internal Medicine

## 2024-05-02 DIAGNOSIS — F32A Depression, unspecified: Secondary | ICD-10-CM | POA: Insufficient documentation

## 2024-05-02 DIAGNOSIS — I10 Essential (primary) hypertension: Secondary | ICD-10-CM

## 2024-05-02 DIAGNOSIS — D72829 Elevated white blood cell count, unspecified: Secondary | ICD-10-CM | POA: Diagnosis not present

## 2024-05-02 DIAGNOSIS — J939 Pneumothorax, unspecified: Secondary | ICD-10-CM | POA: Diagnosis not present

## 2024-05-02 DIAGNOSIS — K219 Gastro-esophageal reflux disease without esophagitis: Secondary | ICD-10-CM | POA: Diagnosis not present

## 2024-05-02 DIAGNOSIS — J811 Chronic pulmonary edema: Secondary | ICD-10-CM | POA: Diagnosis not present

## 2024-05-02 DIAGNOSIS — E871 Hypo-osmolality and hyponatremia: Secondary | ICD-10-CM | POA: Diagnosis not present

## 2024-05-02 DIAGNOSIS — E8729 Other acidosis: Secondary | ICD-10-CM | POA: Diagnosis not present

## 2024-05-02 DIAGNOSIS — D62 Acute posthemorrhagic anemia: Secondary | ICD-10-CM | POA: Diagnosis not present

## 2024-05-02 DIAGNOSIS — Z7902 Long term (current) use of antithrombotics/antiplatelets: Secondary | ICD-10-CM | POA: Diagnosis not present

## 2024-05-02 DIAGNOSIS — I25110 Atherosclerotic heart disease of native coronary artery with unstable angina pectoris: Secondary | ICD-10-CM | POA: Diagnosis not present

## 2024-05-02 DIAGNOSIS — E785 Hyperlipidemia, unspecified: Secondary | ICD-10-CM | POA: Insufficient documentation

## 2024-05-02 DIAGNOSIS — E1142 Type 2 diabetes mellitus with diabetic polyneuropathy: Secondary | ICD-10-CM

## 2024-05-02 DIAGNOSIS — Y831 Surgical operation with implant of artificial internal device as the cause of abnormal reaction of the patient, or of later complication, without mention of misadventure at the time of the procedure: Secondary | ICD-10-CM | POA: Diagnosis present

## 2024-05-02 DIAGNOSIS — J90 Pleural effusion, not elsewhere classified: Secondary | ICD-10-CM | POA: Diagnosis not present

## 2024-05-02 DIAGNOSIS — I272 Pulmonary hypertension, unspecified: Secondary | ICD-10-CM | POA: Diagnosis not present

## 2024-05-02 DIAGNOSIS — J982 Interstitial emphysema: Secondary | ICD-10-CM | POA: Diagnosis not present

## 2024-05-02 DIAGNOSIS — I951 Orthostatic hypotension: Secondary | ICD-10-CM | POA: Diagnosis not present

## 2024-05-02 DIAGNOSIS — I350 Nonrheumatic aortic (valve) stenosis: Secondary | ICD-10-CM | POA: Diagnosis present

## 2024-05-02 DIAGNOSIS — I249 Acute ischemic heart disease, unspecified: Secondary | ICD-10-CM | POA: Diagnosis present

## 2024-05-02 DIAGNOSIS — Z743 Need for continuous supervision: Secondary | ICD-10-CM | POA: Diagnosis not present

## 2024-05-02 DIAGNOSIS — J849 Interstitial pulmonary disease, unspecified: Secondary | ICD-10-CM | POA: Diagnosis not present

## 2024-05-02 DIAGNOSIS — Z48812 Encounter for surgical aftercare following surgery on the circulatory system: Secondary | ICD-10-CM | POA: Diagnosis not present

## 2024-05-02 DIAGNOSIS — I959 Hypotension, unspecified: Secondary | ICD-10-CM | POA: Diagnosis not present

## 2024-05-02 DIAGNOSIS — Z9889 Other specified postprocedural states: Secondary | ICD-10-CM | POA: Diagnosis not present

## 2024-05-02 DIAGNOSIS — J449 Chronic obstructive pulmonary disease, unspecified: Secondary | ICD-10-CM

## 2024-05-02 DIAGNOSIS — Z7901 Long term (current) use of anticoagulants: Secondary | ICD-10-CM | POA: Diagnosis not present

## 2024-05-02 DIAGNOSIS — I2510 Atherosclerotic heart disease of native coronary artery without angina pectoris: Secondary | ICD-10-CM

## 2024-05-02 DIAGNOSIS — R0902 Hypoxemia: Secondary | ICD-10-CM | POA: Diagnosis not present

## 2024-05-02 DIAGNOSIS — E1165 Type 2 diabetes mellitus with hyperglycemia: Secondary | ICD-10-CM | POA: Diagnosis not present

## 2024-05-02 DIAGNOSIS — E039 Hypothyroidism, unspecified: Secondary | ICD-10-CM | POA: Diagnosis not present

## 2024-05-02 DIAGNOSIS — I480 Paroxysmal atrial fibrillation: Secondary | ICD-10-CM | POA: Diagnosis not present

## 2024-05-02 DIAGNOSIS — T82855A Stenosis of coronary artery stent, initial encounter: Secondary | ICD-10-CM | POA: Diagnosis not present

## 2024-05-02 DIAGNOSIS — Z01810 Encounter for preprocedural cardiovascular examination: Secondary | ICD-10-CM | POA: Diagnosis not present

## 2024-05-02 DIAGNOSIS — Z794 Long term (current) use of insulin: Secondary | ICD-10-CM | POA: Diagnosis not present

## 2024-05-02 DIAGNOSIS — G2581 Restless legs syndrome: Secondary | ICD-10-CM | POA: Diagnosis not present

## 2024-05-02 DIAGNOSIS — E1159 Type 2 diabetes mellitus with other circulatory complications: Secondary | ICD-10-CM | POA: Diagnosis not present

## 2024-05-02 DIAGNOSIS — Z951 Presence of aortocoronary bypass graft: Secondary | ICD-10-CM | POA: Diagnosis not present

## 2024-05-02 DIAGNOSIS — F419 Anxiety disorder, unspecified: Secondary | ICD-10-CM | POA: Diagnosis present

## 2024-05-02 DIAGNOSIS — R918 Other nonspecific abnormal finding of lung field: Secondary | ICD-10-CM | POA: Diagnosis not present

## 2024-05-02 DIAGNOSIS — I4891 Unspecified atrial fibrillation: Secondary | ICD-10-CM | POA: Diagnosis not present

## 2024-05-02 DIAGNOSIS — R509 Fever, unspecified: Secondary | ICD-10-CM | POA: Diagnosis not present

## 2024-05-02 DIAGNOSIS — G4733 Obstructive sleep apnea (adult) (pediatric): Secondary | ICD-10-CM | POA: Diagnosis not present

## 2024-05-02 DIAGNOSIS — E66812 Obesity, class 2: Secondary | ICD-10-CM | POA: Diagnosis present

## 2024-05-02 DIAGNOSIS — I214 Non-ST elevation (NSTEMI) myocardial infarction: Secondary | ICD-10-CM | POA: Diagnosis not present

## 2024-05-02 DIAGNOSIS — I081 Rheumatic disorders of both mitral and tricuspid valves: Secondary | ICD-10-CM | POA: Diagnosis not present

## 2024-05-02 DIAGNOSIS — Z720 Tobacco use: Secondary | ICD-10-CM | POA: Diagnosis not present

## 2024-05-02 DIAGNOSIS — N179 Acute kidney failure, unspecified: Secondary | ICD-10-CM

## 2024-05-02 DIAGNOSIS — I21A9 Other myocardial infarction type: Secondary | ICD-10-CM | POA: Diagnosis not present

## 2024-05-02 DIAGNOSIS — Z4682 Encounter for fitting and adjustment of non-vascular catheter: Secondary | ICD-10-CM | POA: Diagnosis not present

## 2024-05-02 DIAGNOSIS — J9811 Atelectasis: Secondary | ICD-10-CM | POA: Diagnosis not present

## 2024-05-02 DIAGNOSIS — E782 Mixed hyperlipidemia: Secondary | ICD-10-CM | POA: Diagnosis not present

## 2024-05-02 DIAGNOSIS — Z452 Encounter for adjustment and management of vascular access device: Secondary | ICD-10-CM | POA: Diagnosis not present

## 2024-05-02 LAB — TROPONIN I (HIGH SENSITIVITY)
Troponin I (High Sensitivity): 33 ng/L — ABNORMAL HIGH (ref <18)
Troponin I (High Sensitivity): 34 ng/L — ABNORMAL HIGH (ref <18)
Troponin I (High Sensitivity): 47 ng/L — ABNORMAL HIGH (ref <18)

## 2024-05-02 LAB — PROTIME-INR
INR: 2.7 — ABNORMAL HIGH (ref 0.8–1.2)
INR: 2.9 — ABNORMAL HIGH (ref 0.8–1.2)
Prothrombin Time: 29.8 s — ABNORMAL HIGH (ref 11.4–15.2)
Prothrombin Time: 31.9 s — ABNORMAL HIGH (ref 11.4–15.2)

## 2024-05-02 LAB — ECHOCARDIOGRAM COMPLETE
AR max vel: 1.4 cm2
AV Area VTI: 1.65 cm2
AV Area mean vel: 1.45 cm2
AV Mean grad: 14 mmHg
AV Peak grad: 24.2 mmHg
Ao pk vel: 2.46 m/s
Area-P 1/2: 2.95 cm2
Height: 64 in
S' Lateral: 2.3 cm
Weight: 3477.98 [oz_av]

## 2024-05-02 LAB — BASIC METABOLIC PANEL WITH GFR
Anion gap: 14 (ref 5–15)
BUN: 28 mg/dL — ABNORMAL HIGH (ref 8–23)
CO2: 22 mmol/L (ref 22–32)
Calcium: 10.2 mg/dL (ref 8.9–10.3)
Chloride: 97 mmol/L — ABNORMAL LOW (ref 98–111)
Creatinine, Ser: 1.25 mg/dL — ABNORMAL HIGH (ref 0.44–1.00)
GFR, Estimated: 47 mL/min — ABNORMAL LOW (ref >60)
Glucose, Bld: 380 mg/dL — ABNORMAL HIGH (ref 70–99)
Potassium: 3.8 mmol/L (ref 3.5–5.1)
Sodium: 133 mmol/L — ABNORMAL LOW (ref 135–145)

## 2024-05-02 LAB — GLUCOSE, CAPILLARY
Glucose-Capillary: 220 mg/dL — ABNORMAL HIGH (ref 70–99)
Glucose-Capillary: 249 mg/dL — ABNORMAL HIGH (ref 70–99)

## 2024-05-02 LAB — CBG MONITORING, ED: Glucose-Capillary: 120 mg/dL — ABNORMAL HIGH (ref 70–99)

## 2024-05-02 LAB — HIV ANTIBODY (ROUTINE TESTING W REFLEX): HIV Screen 4th Generation wRfx: NONREACTIVE

## 2024-05-02 LAB — MRSA NEXT GEN BY PCR, NASAL: MRSA by PCR Next Gen: NOT DETECTED

## 2024-05-02 SURGERY — LEFT HEART CATH AND CORONARY ANGIOGRAPHY
Anesthesia: LOCAL

## 2024-05-02 MED ORDER — PANTOPRAZOLE SODIUM 40 MG PO TBEC
80.0000 mg | DELAYED_RELEASE_TABLET | Freq: Every day | ORAL | Status: DC
Start: 2024-05-02 — End: 2024-05-06
  Administered 2024-05-02 – 2024-05-05 (×4): 80 mg via ORAL
  Filled 2024-05-02 (×4): qty 2

## 2024-05-02 MED ORDER — TRAZODONE HCL 50 MG PO TABS
25.0000 mg | ORAL_TABLET | Freq: Every evening | ORAL | Status: DC | PRN
  Administered 2024-05-04: 25 mg via ORAL
  Filled 2024-05-02: qty 1

## 2024-05-02 MED ORDER — LEVOTHYROXINE SODIUM 50 MCG PO TABS
50.0000 ug | ORAL_TABLET | Freq: Every day | ORAL | Status: DC
  Administered 2024-05-02 – 2024-05-13 (×12): 50 ug via ORAL
  Filled 2024-05-02 (×12): qty 1

## 2024-05-02 MED ORDER — ASPIRIN 81 MG PO TBEC
81.0000 mg | DELAYED_RELEASE_TABLET | Freq: Every day | ORAL | Status: DC
  Administered 2024-05-03 – 2024-05-05 (×3): 81 mg via ORAL
  Filled 2024-05-02 (×3): qty 1

## 2024-05-02 MED ORDER — LORATADINE 10 MG PO TABS
10.0000 mg | ORAL_TABLET | Freq: Every day | ORAL | Status: DC
  Administered 2024-05-02 – 2024-05-05 (×4): 10 mg via ORAL
  Filled 2024-05-02 (×4): qty 1

## 2024-05-02 MED ORDER — LEVOCETIRIZINE DIHYDROCHLORIDE 5 MG PO TABS
5.0000 mg | ORAL_TABLET | Freq: Every evening | ORAL | Status: DC
Start: 2024-05-02 — End: 2024-05-02

## 2024-05-02 MED ORDER — INDAPAMIDE 2.5 MG PO TABS
2.5000 mg | ORAL_TABLET | Freq: Every day | ORAL | Status: DC
Start: 2024-05-02 — End: 2024-05-02
  Filled 2024-05-02 (×2): qty 1

## 2024-05-02 MED ORDER — MONTELUKAST SODIUM 10 MG PO TABS
10.0000 mg | ORAL_TABLET | Freq: Every day | ORAL | Status: DC
Start: 2024-05-02 — End: 2024-05-06
  Administered 2024-05-02 – 2024-05-05 (×4): 10 mg via ORAL
  Filled 2024-05-02 (×4): qty 1

## 2024-05-02 MED ORDER — INSULIN ASPART 100 UNIT/ML IJ SOLN
0.0000 [IU] | Freq: Every day | INTRAMUSCULAR | Status: DC
  Administered 2024-05-02 – 2024-05-05 (×2): 2 [IU] via SUBCUTANEOUS

## 2024-05-02 MED ORDER — MODAFINIL 100 MG PO TABS
100.0000 mg | ORAL_TABLET | Freq: Every day | ORAL | Status: DC
Start: 2024-05-03 — End: 2024-05-06
  Administered 2024-05-03: 100 mg via ORAL
  Filled 2024-05-02 (×3): qty 1

## 2024-05-02 MED ORDER — MORPHINE SULFATE (PF) 2 MG/ML IV SOLN
2.0000 mg | INTRAVENOUS | Status: DC | PRN
  Administered 2024-05-02 – 2024-05-03 (×6): 2 mg via INTRAVENOUS
  Filled 2024-05-02 (×6): qty 1

## 2024-05-02 MED ORDER — ACETAMINOPHEN 325 MG PO TABS
650.0000 mg | ORAL_TABLET | ORAL | Status: DC | PRN
  Administered 2024-05-05: 650 mg via ORAL
  Filled 2024-05-02 (×2): qty 2

## 2024-05-02 MED ORDER — CLOPIDOGREL BISULFATE 75 MG PO TABS
75.0000 mg | ORAL_TABLET | Freq: Every day | ORAL | Status: DC
Start: 2024-05-02 — End: 2024-05-04
  Administered 2024-05-02 – 2024-05-04 (×3): 75 mg via ORAL
  Filled 2024-05-02 (×3): qty 1

## 2024-05-02 MED ORDER — ASPIRIN 81 MG PO CHEW
324.0000 mg | CHEWABLE_TABLET | ORAL | Status: AC
  Administered 2024-05-02: 324 mg via ORAL
  Filled 2024-05-02: qty 4

## 2024-05-02 MED ORDER — DONEPEZIL HCL 5 MG PO TABS
5.0000 mg | ORAL_TABLET | Freq: Every evening | ORAL | Status: DC
  Administered 2024-05-02 – 2024-05-12 (×11): 5 mg via ORAL
  Filled 2024-05-02 (×12): qty 1

## 2024-05-02 MED ORDER — VITAMIN D 25 MCG (1000 UNIT) PO TABS
5000.0000 [IU] | ORAL_TABLET | Freq: Every day | ORAL | Status: DC
  Administered 2024-05-03 – 2024-05-05 (×3): 5000 [IU] via ORAL
  Filled 2024-05-02 (×3): qty 5

## 2024-05-02 MED ORDER — SODIUM CHLORIDE 0.9 % IV SOLN: INTRAVENOUS | Status: AC

## 2024-05-02 MED ORDER — FREE WATER: 500.0000 mL | Freq: Once | Status: DC

## 2024-05-02 MED ORDER — EMPAGLIFLOZIN 25 MG PO TABS
25.0000 mg | ORAL_TABLET | Freq: Every day | ORAL | Status: DC
Start: 2024-05-03 — End: 2024-05-02
  Filled 2024-05-02: qty 1

## 2024-05-02 MED ORDER — ASPIRIN 300 MG RE SUPP: 300.0000 mg | RECTAL | Status: AC

## 2024-05-02 MED ORDER — INSULIN GLARGINE 100 UNIT/ML ~~LOC~~ SOLN
110.0000 [IU] | Freq: Every day | SUBCUTANEOUS | Status: DC
  Filled 2024-05-02: qty 1.1

## 2024-05-02 MED ORDER — ADULT MULTIVITAMIN W/MINERALS CH
1.0000 | ORAL_TABLET | Freq: Every day | ORAL | Status: DC
  Administered 2024-05-02 – 2024-05-05 (×4): 1 via ORAL
  Filled 2024-05-02 (×4): qty 1

## 2024-05-02 MED ORDER — INSULIN GLARGINE 100 UNIT/ML ~~LOC~~ SOLN
25.0000 [IU] | Freq: Every day | SUBCUTANEOUS | Status: DC
  Administered 2024-05-02 – 2024-05-05 (×4): 25 [IU] via SUBCUTANEOUS
  Filled 2024-05-02 (×7): qty 0.25

## 2024-05-02 MED ORDER — MAGNESIUM OXIDE -MG SUPPLEMENT 400 (240 MG) MG PO TABS
400.0000 mg | ORAL_TABLET | Freq: Every day | ORAL | Status: DC
Start: 2024-05-02 — End: 2024-05-06
  Administered 2024-05-02 – 2024-05-05 (×4): 400 mg via ORAL
  Filled 2024-05-02 (×4): qty 1

## 2024-05-02 MED ORDER — ALPRAZOLAM 0.25 MG PO TABS
0.2500 mg | ORAL_TABLET | Freq: Two times a day (BID) | ORAL | Status: DC | PRN
  Administered 2024-05-03 – 2024-05-04 (×2): 0.25 mg via ORAL
  Filled 2024-05-02 (×2): qty 1

## 2024-05-02 MED ORDER — GABAPENTIN 100 MG PO CAPS
100.0000 mg | ORAL_CAPSULE | Freq: Every day | ORAL | Status: DC
  Administered 2024-05-02 – 2024-05-12 (×11): 100 mg via ORAL
  Filled 2024-05-02 (×11): qty 1

## 2024-05-02 MED ORDER — INSULIN DEGLUDEC 200 UNIT/ML ~~LOC~~ SOPN: 110.0000 [IU] | PEN_INJECTOR | Freq: Every day | SUBCUTANEOUS | Status: DC

## 2024-05-02 MED ORDER — FENOFIBRATE 160 MG PO TABS
160.0000 mg | ORAL_TABLET | Freq: Every day | ORAL | Status: DC
  Administered 2024-05-02 – 2024-05-13 (×11): 160 mg via ORAL
  Filled 2024-05-02 (×12): qty 1

## 2024-05-02 MED ORDER — ISOSORBIDE MONONITRATE ER 60 MG PO TB24: 90.0000 mg | ORAL_TABLET | Freq: Every day | ORAL | Status: DC

## 2024-05-02 MED ORDER — INSULIN ASPART 100 UNIT/ML IJ SOLN
0.0000 [IU] | Freq: Three times a day (TID) | INTRAMUSCULAR | Status: DC
  Administered 2024-05-02: 7 [IU] via SUBCUTANEOUS
  Administered 2024-05-03 – 2024-05-04 (×2): 4 [IU] via SUBCUTANEOUS
  Administered 2024-05-04: 15 [IU] via SUBCUTANEOUS
  Administered 2024-05-04: 7 [IU] via SUBCUTANEOUS
  Administered 2024-05-05: 20 [IU] via SUBCUTANEOUS
  Administered 2024-05-05: 4 [IU] via SUBCUTANEOUS
  Administered 2024-05-05: 7 [IU] via SUBCUTANEOUS

## 2024-05-02 MED ORDER — BUSPIRONE HCL 10 MG PO TABS
5.0000 mg | ORAL_TABLET | Freq: Every evening | ORAL | Status: DC | PRN
Start: 2024-05-02 — End: 2024-05-06

## 2024-05-02 MED ORDER — NITROGLYCERIN 0.4 MG SL SUBL
0.4000 mg | SUBLINGUAL_TABLET | SUBLINGUAL | Status: DC | PRN
Start: 2024-05-02 — End: 2024-05-06
  Administered 2024-05-02 – 2024-05-03 (×4): 0.4 mg via SUBLINGUAL
  Filled 2024-05-02 (×6): qty 1

## 2024-05-02 MED ORDER — CITALOPRAM HYDROBROMIDE 20 MG PO TABS
20.0000 mg | ORAL_TABLET | Freq: Every evening | ORAL | Status: DC
  Administered 2024-05-02 – 2024-05-12 (×11): 20 mg via ORAL
  Filled 2024-05-02 (×11): qty 1

## 2024-05-02 MED ORDER — ONDANSETRON HCL 4 MG/2ML IJ SOLN: 4.0000 mg | Freq: Four times a day (QID) | INTRAMUSCULAR | Status: DC | PRN

## 2024-05-02 MED ORDER — ROSUVASTATIN CALCIUM 20 MG PO TABS
40.0000 mg | ORAL_TABLET | Freq: Every day | ORAL | Status: DC
  Administered 2024-05-02 – 2024-05-13 (×11): 40 mg via ORAL
  Filled 2024-05-02 (×5): qty 2
  Filled 2024-05-02: qty 8
  Filled 2024-05-02 (×6): qty 2

## 2024-05-02 MED ORDER — ROPINIROLE HCL 1 MG PO TABS
2.0000 mg | ORAL_TABLET | Freq: Every day | ORAL | Status: DC
Start: 2024-05-02 — End: 2024-05-09
  Administered 2024-05-02 – 2024-05-08 (×7): 2 mg via ORAL
  Filled 2024-05-02 (×7): qty 2

## 2024-05-02 NOTE — Assessment & Plan Note (Signed)
-   The patient will be placed on supplemental coverage with NovoLog . - Will hold off metformin . - Will continue basal coverage. - Will continue Neurontin .

## 2024-05-02 NOTE — Assessment & Plan Note (Deleted)
-   The patient will be placed on supplemental coverage with NovoLog . - Will hold off metformin . - Will continue basal coverage.

## 2024-05-02 NOTE — Assessment & Plan Note (Addendum)
-  Continue Xanax and BuSpar

## 2024-05-02 NOTE — Progress Notes (Signed)
    Patient reported that while ambulating in her room and exerting herself, she developed shortness of breath and chest pain, followed by hyperventilation. This was the first episode today. Symptoms resolved after administration of morphine . On my evaluation, BP was 118/49. If chest pain recurs and blood pressure remains stable, administer nitroglycerin . Repeat STAT INR and Troponin is pending.    Signed, Lorette CINDERELLA Kapur, PA-C 05/02/2024, 3:26 PM

## 2024-05-02 NOTE — Assessment & Plan Note (Signed)
-   Will continue Xanax , BuSpar  and Celexa .

## 2024-05-02 NOTE — Consult Note (Addendum)
 Cardiology Consultation   Patient ID: TSERING LEAMAN MRN: [id number]; DOB: 01/04/1956  Admit date: 05/01/2024 Date of Consult: 05/02/2024  PCP:  Jeanette Comer [name]   [facility] Providers Cardiologist:  Jayson Sierras, MD  Cardiology APP:  Miriam Norris, NP     Patient Profile: HEBAH BOGOSIAN is a 68 y.o. female with a hx of CAD (s/p DES in 03/2023  to mid LAD with IVUS guidance. Abrupt occlusion of diagonal that was jailed by stent, Successfully treated with POBA), HLD, HTN, PAF, mild aortic valve stenosis (Echo 02/2023 with mean gradient measuring 12 mmHg), and OSA (compliant with CPAP), who is being seen 05/02/2024 for the evaluation of chest pain at the request of Dr. Bryn.  History of Present Illness: Ms. Merkle was last seen by heart care 09/06/2023 by Norris Miriam, NP for follow-up.  Reported ongoing extreme fatigue that was felt multifactorial along with deconditioning.  Continued on Plavix  75 mg daily, Imdur  90 mg daily, NTG as needed, Crestor  40 mg daily, warfarin per Coumadin  clinic.  Presented to [facility] ED for chest pain.  Abnormal labs include TN 33 >47, NA 133, CL 97, CR 1.25, glucose 380 Normal labs include K3.8, CBC EKG 05/01/2024 showed sinus bradycardia, HR 54, first-degree AV block, PR 214 ms, prolonged QT interval QTc 526 ms, unchanged nonspecific T wave abnormalities EKG 05/02/2024 shows sinus bradycardia, HR 54, first-degree AV block PR 213 MS, prolonged QT interval 509 ms, unchanged nonspecific T wave abnormalities CXR without any active disease. ECHO pending.  Treated with ASA 324 mg, IV morphine , IV Zofran  and NTG.  On interview, patient is accompanied by her husband. Patient reports intermittent chest pain for 1 week that has increased in frequency from 1-2 times a day to 4-5 times per day.  CP described as sharp and lasting 5 to 10 minutes, radiating to jaw and left arm, occurring at rest but more severe with exertion.  Noted some relief with NTG  but ran out yesterday.  Prior to these episodes CP occurred approximately once a month.  Associated with nausea and numbness to left arm yesterday.  Notes chronic SOB with minimal exertion that resolves with rest, however SOB has worse with chest pain.  Notes ongoing dizziness and fatigue that has gotten worse over the past week.  Denies any diaphoresis, palpitations, edema, syncope.  Currently, denies any chest pain. Notes resolution after morphine  and NTG, however CP did return while in ED but resolved after additional meds.  Patient is not very active, however notes similar presentation when walking around grocery store and having to take NTG along with rest. Denies any tobacco use, etoh, or drug use.  She has not been using CPAP machine consistently for the past month due to discomfort. Report daily medication compliance.   Past Medical History:  Diagnosis Date   ADD (attention deficit disorder)    Diabetes mellitus without complication (HCC)    Hypertension     Past Surgical History:  Procedure Laterality Date   ABDOMINAL HYSTERECTOMY     COLONOSCOPY  02/2022   CORONARY PRESSURE/FFR STUDY N/A 04/16/2023   Procedure: CORONARY PRESSURE/FFR STUDY;  Surgeon: Swaziland, Peter M, MD;  Location: [facility] INVASIVE CV LAB;  Service: Cardiovascular;  Laterality: N/A;   CORONARY STENT INTERVENTION N/A 04/16/2023   Procedure: CORONARY STENT INTERVENTION;  Surgeon: Swaziland, Peter M, MD;  Location: [facility] INVASIVE CV LAB;  Service: Cardiovascular;  Laterality: N/A;   CORONARY ULTRASOUND/IVUS N/A 04/16/2023   Procedure: Coronary Ultrasound/IVUS;  Surgeon:  Swaziland, Peter M, MD;  Location: [facility] INVASIVE CV LAB;  Service: Cardiovascular;  Laterality: N/A;   EXCISIONAL HEMORRHOIDECTOMY     INCONTINENCE SURGERY     RIGHT/LEFT HEART CATH AND CORONARY ANGIOGRAPHY N/A 06/13/2021   Procedure: RIGHT/LEFT HEART CATH AND CORONARY ANGIOGRAPHY;  Surgeon: Swaziland, Peter M, MD;  Location: [facility] INVASIVE CV LAB;  Service: Cardiovascular;   Laterality: N/A;   RIGHT/LEFT HEART CATH AND CORONARY ANGIOGRAPHY N/A 04/16/2023   Procedure: RIGHT/LEFT HEART CATH AND CORONARY ANGIOGRAPHY;  Surgeon: Swaziland, Peter M, MD;  Location: [facility] INVASIVE CV LAB;  Service: Cardiovascular;  Laterality: N/A;     Home Medications:  Prior to Admission medications   Medication Sig Start Date End Date Taking? Authorizing Provider  Accu-Chek Softclix Lancets lancets Use as instructed to test blood glucose four times daily 06/09/22   Nida, Gebreselassie W, MD  busPIRone  (BUSPAR ) 5 MG tablet Take 5 mg by mouth at bedtime. 01/27/22   [provider]  Cholecalciferol  (VITAMIN D3) 125 MCG (5000 UT) CAPS Take 1 capsule (5,000 Units total) by mouth daily. 12/16/21   Nida, Gebreselassie W, MD  citalopram  (CELEXA ) 20 MG tablet Take 1 tablet (20 mg total) by mouth every evening. 06/09/23   Miriam Norris, NP  clopidogrel  (PLAVIX ) 75 MG tablet TAKE 1 TABLET BY MOUTH DAILY 11/05/23   Miriam Norris, NP  Continuous Glucose Receiver (FREESTYLE LIBRE 2 READER) DEVI USE TO CHECK GLUCOSE AS DIRECTED 12/04/22   Nida, Gebreselassie W, MD  Continuous Glucose Sensor (FREESTYLE LIBRE 2 SENSOR) MISC CHANGE SENSOR EVERY 14 DAYS USE  TO CHECK BLOOD GLUCOSE  CONTINUOUSLY 11/01/23   Nida, Gebreselassie W, MD  donepezil  (ARICEPT ) 5 MG tablet Take 5 mg by mouth every evening. 08/06/22   [provider]  empagliflozin  (JARDIANCE ) 25 MG TABS tablet Take 1 tablet (25 mg total) by mouth daily. 03/23/24   Nida, Gebreselassie W, MD  estradiol  (ESTRACE ) 0.1 MG/GM vaginal cream Discard plastic applicator. Insert a blueberry size amount (approximately 1 gram) of cream on fingertip inside vagina at bedtime every night for 1 week then every other night. For long term use. 08/03/23   Gerldine Lauraine BROCKS, FNP  estradiol  (ESTRACE ) 2 MG tablet Take 1 mg by mouth daily. 09/14/23   [provider]  fenofibrate  160 MG tablet Take 1 tablet (160 mg total) by mouth daily. 07/06/23   Miriam Norris, NP  gabapentin  (NEURONTIN ) 100 MG capsule Take 1 capsule (100 mg total) by mouth at bedtime. 04/17/23   Dunn, Dayna N, PA-C  glucose blood test strip 1 each by Other route as needed. Use as instructed to test blood glucose four times daily 06/09/22   Nida, Gebreselassie W, MD  indapamide  (LOZOL ) 2.5 MG tablet Take 1 tablet (2.5 mg total) by mouth daily. 02/21/24   McKenzie, Belvie CROME, MD  insulin  degludec (TRESIBA  FLEXTOUCH) 200 UNIT/ML FlexTouch Pen Inject 110 Units into the skin at bedtime. 03/23/24   Nida, Gebreselassie W, MD  insulin  lispro (HUMALOG  KWIKPEN) 100 UNIT/ML KwikPen Inject 16-22 Units into the skin 3 (three) times daily before meals. 03/27/24   Nida, Gebreselassie W, MD  Insulin  Pen Needle (B-D ULTRAFINE III SHORT PEN) 31G X 8 MM MISC 1 each by Does not apply route as directed. 01/06/22   Nida, Gebreselassie W, MD  isosorbide  mononitrate (IMDUR ) 60 MG 24 hr tablet Take 1.5 tablets (90 mg total) by mouth daily. Can take an additional half tab as needed for chest pain 05/25/23   Miriam Norris, NP  levocetirizine (XYZAL ) 5 MG tablet Take 5 mg by mouth every evening.    [provider]  levothyroxine  (SYNTHROID ) 50 MCG tablet TAKE 1 TABLET BY MOUTH DAILY  BEFORE BREAKFAST 11/11/23   Nida, Gebreselassie W, MD  Magnesium  Oxide (MAGNESIUM  OXIDE 400) 240 MG PACK Take 400 mg by mouth daily. 07/19/23   Miriam Norris, NP  metFORMIN  (GLUCOPHAGE ) 500 MG tablet TAKE 1 TABLET BY MOUTH DAILY  WITH BREAKFAST 11/11/23   Nida, Gebreselassie W, MD  modafinil  (PROVIGIL ) 100 MG tablet Take 1 tablet (100 mg total) by mouth daily. 03/02/24   Jude Harden GAILS, MD  montelukast  (SINGULAIR ) 10 MG tablet Take 10 mg by mouth at bedtime.    [provider]  Multiple Vitamin (MULTIVITAMIN ADULT PO) Take 1 tablet by mouth daily.    [provider]  nitroGLYCERIN  (NITROSTAT ) 0.4 MG SL tablet Place 1 tablet (0.4 mg total) under the tongue every 5 (five) minutes as needed for chest pain.  05/01/24   Miriam Norris, NP  OVER THE COUNTER MEDICATION Take 2 capsules by mouth daily. Omega XL    [provider]  pantoprazole  (PROTONIX ) 40 MG tablet TAKE 2 TABLETS EVERY DAY 08/19/23   Miriam Norris, NP  rOPINIRole  (REQUIP ) 2 MG tablet Take 2 mg by mouth at bedtime. 06/22/23   [provider]  rosuvastatin  (CRESTOR ) 40 MG tablet Take 1 tablet (40 mg total) by mouth daily. 04/17/23   Dunn, Dayna N, PA-C  Semaglutide ,0.25 or 0.5MG /DOS, (OZEMPIC , 0.25 OR 0.5 MG/DOSE,) 2 MG/1.5ML SOPN Inject 0.5 mg into the skin once a week.    [provider]  Vibegron  (GEMTESA ) 75 MG TABS Take 1 tablet (75 mg total) by mouth daily. 10/11/23   Gerldine Lauraine BROCKS, FNP  Vibegron  (GEMTESA ) 75 MG TABS Take 1 tablet (75 mg total) by mouth daily. 12/15/23   Gerldine Lauraine BROCKS, FNP  Vibegron  (GEMTESA ) 75 MG TABS Take 1 tablet (75 mg total) by mouth daily. 02/01/24   Larocco, [name] C, FNP  warfarin (COUMADIN ) 5 MG tablet TAKE ONE-HALF TABLET BY MOUTH  DAILY EXCEPT 1 TABLET BY MOUTH  ON SUNDAYS, TUESDAYS, THURSDAYS, SATURDAYS OR AS DIRECTED 05/01/24   Alvan Dorn FALCON, MD    Scheduled Meds:  [START ON 05/03/2024] aspirin  EC  81 mg Oral Daily   citalopram   20 mg Oral QPM   clopidogrel   75 mg Oral Daily   donepezil   5 mg Oral QPM   empagliflozin   25 mg Oral Daily   fenofibrate   160 mg Oral Daily   gabapentin   100 mg Oral QHS   indapamide   2.5 mg Oral Daily   insulin  degludec  110 Units Subcutaneous QHS   isosorbide  mononitrate  90 mg Oral Daily   levocetirizine  5 mg Oral QPM   levothyroxine   50 mcg Oral Q0600   Magnesium  Oxide 400  400 mg Oral Daily   modafinil   100 mg Oral Daily   montelukast   10 mg Oral QHS   multivitamin with minerals  1 tablet Oral Daily   pantoprazole   80 mg Oral Daily   rOPINIRole   2 mg Oral QHS   rosuvastatin   40 mg Oral Daily   Vitamin D3  5,000 Units Oral Daily   Continuous Infusions:  sodium chloride  100 mL/hr at 05/02/24 0604   PRN Meds: acetaminophen ,  ALPRAZolam , busPIRone , morphine  injection, nitroGLYCERIN , ondansetron  (ZOFRAN ) IV, traZODone   Allergies:    Allergies  Allergen Reactions   Azithromycin Hives   Codeine Nausea Only  Atomoxetine Nausea And Vomiting   Atorvastatin Other (See Comments)    Restless legs   Lisinopril Nausea And Vomiting    Social History:   Social History   Socioeconomic History   Marital status: Married    Spouse name: Meribeth   Number of children: Not on file   Years of education: Not on file   Highest education level: Not on file  Occupational History   Not on file  Tobacco Use   Smoking status: Former    Current packs/day: 0.00    Types: Cigarettes    Quit date: 08/17/1994    Years since quitting: 29.7   Smokeless tobacco: Never  Vaping Use   Vaping status: Never Used  Substance and Sexual Activity   Alcohol  use: No   Drug use: No   Sexual activity: Yes  Other Topics Concern   Not on file  Social History Narrative   Lives at home with husband, Meribeth   Former smoker Smoked 2 ppd x 25 yrs; Quit in 1996   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  All other ROS reviewed and negative.     Physical Exam/Data: Vitals:   05/02/24 0500 05/02/24 0600 05/02/24 0700 05/02/24 0701  BP: (!) 109/39 119/66 105/60   Pulse: (!) 51 (!) 59 (!) 51 (!) 52  Resp: 16 12 (!) 9 13  Temp:      SpO2: 95% 98% 93%   Weight:      Height:       No intake or output data in the 24 hours ending 05/02/24 0839    05/01/2024   11:19 PM 03/23/2024    1:38 PM 12/23/2023     2:26 PM  Last 3 Weights  Weight (lbs) 217 lb 6 oz 217 lb 6.4 oz 221 lb  Weight (kg) 98.6 kg 98.612 kg 100.245 kg     Body mass index is 37.31 kg/m.  General:  Well nourished, well developed, in no acute distress; Wearing [place]  HEENT: normal Neck: no JVD Vascular: No carotid bruits; Distal pulses 2+ bilaterally Cardiac:  normal S1, S2; RRR; no murmur  Lungs:  clear to auscultation bilaterally, no wheezing, rhonchi or rales  Abd: soft, nontender, no hepatomegaly  Ext: no edema Musculoskeletal:  No deformities, BUE and BLE strength normal and equal Skin: warm and dry  Neuro:  CNs 2-12 intact, no focal abnormalities noted Psych:  Normal affect   EKG:  The EKG was personally reviewed and demonstrates:   05/01/2024 showed sinus bradycardia, HR 54, first-degree AV block, PR 214 ms, prolonged QT interval QTc 526 ms, unchanged nonspecific T wave abnormalities 05/02/2024 shows sinus bradycardia, HR 54, first-degree AV block PR 213 MS, prolonged QT interval 509 ms, unchanged nonspecific T wave abnormalities Telemetry:  Telemetry was personally reviewed and demonstrates:  SB, HR 50's  Relevant CV Studies: Cardiac CTA 02/2023 IMPRESSION: 1. Coronary calcium  score of 153. This was 83rd percentile for  age-, sex, and race-matched controls. 2. Total plaque volume 313 mm3 which is 64th percentile for age- and sex-matched controls (calcified plaque 59 mm3; non-calcified plaque 254 mm3). TPV is severe. 3. Normal coronary origin with right dominance. 4. There is moderate (50-69%) calcified plaque in the LAD and LCX. CAD-RADS 3. 5. Will send study for FFRct. 6. Aortic atherosclerosis.   FFRCT ANALYSIS for abnormal coronary CT-A. 1. Left Main: FFRct 0.99 2. LAD: FFRct 0.88 proximal, 0.74 mid, 0.7 distal 3. LCX: FFRct 0.98 proximal, 0.83 mid 4. RCA: FFRct 0.94 proximal, 0.86 mid, 0.83 distal IMPRESSION: 1. FFRct findings are consistent with significant stenosis in the mid LAD. 2.  Recommend  cardiac catheterization.  ECHO IMPRESSIONS  02/2023  1. Left ventricular ejection fraction, by estimation, is 60 to 65%. Left  ventricular ejection fraction by 3D volume is 63 %. The left ventricle has  normal function. The left ventricle has no regional wall motion  abnormalities. Left ventricular diastolic   parameters are consistent with Grade I diastolic dysfunction (impaired  relaxation). The average left ventricular global longitudinal strain is  -20.2 %. The global longitudinal strain is normal.   2. Right ventricular systolic function is normal. The right ventricular  size is normal.   3. The mitral valve is grossly normal. Trivial mitral valve  regurgitation. No evidence of mitral stenosis.   4. The aortic valve is calcified. There is mild calcification of the  aortic valve. There is mild thickening of the aortic valve. Aortic valve  regurgitation is not visualized. Mild aortic valve stenosis. Aortic valve  area, by VTI measures 1.55 cm.  Aortic valve mean gradient measures 12.0 mmHg. Aortic valve Vmax measures  2.32 m/s.    Cath 03/2023   Prox RCA to Mid RCA lesion is 35% stenosed.   1st Mrg lesion is 60% stenosed.   Prox LAD to Mid LAD lesion is 60% stenosed.   A drug-eluting stent was successfully placed using a SYNERGY XD 3.0X38.   Post intervention, there is a 0% residual stenosis.   1st Diag lesion is 40% stenosed.   Balloon angioplasty was performed using a BALLN SAPPHIRE 2.0X12.   Post intervention, there is a 0% residual stenosis.   The left ventricular systolic function is normal.   LV end diastolic pressure is mildly elevated.   The left ventricular ejection fraction is 55-65% by visual estimate.   Hemodynamic findings consistent with mild pulmonary hypertension.   Recommend to resume Warfarin, at currently prescribed dose and frequency on 04/17/2023.   Recommend concurrent antiplatelet therapy of Aspirin  81 mg for 1 month and Clopidogrel  75mg  daily for 6 months  .   Single vessel obstructive CAD. Long segment of disease in the proximal to mid LAD. Angiographically this did not appear to be severe but flow was impaired on both CT FFR and directly measured RFR. Severe plaque on IVUS with Minimal lumen diameter of 2.5 mm squared.  Normal LV function. Mildly elevated LV filling pressures. PCWP 21/23 mean 16 mm Hg. LVEDP 21 mm Hg Mild pulmonary HTN PAP 40/14 mean 27 mm Hg Cardiac output 4.3 L/min with index 2.08. Successful PCI of the proximal to mid LAD with IVUS guidance and DES x 1. Abrupt occlusion of diagonal that was jailed by stent. Successfully treated with POBA through the stent struts.    Plan: will monitor overnight on telemetry. DAPT with ASA for one month and Plavix  for 6 months. May resume Coumadin  tomorrow if no bleeding problems.  Laboratory Data: High Sensitivity Troponin:   Recent Labs  Lab 05/01/24 2346 05/02/24 0200  TROPONINIHS 33* 47*     Chemistry Recent Labs  Lab 05/01/24 2346  NA 133*  K 3.8  CL 97*  CO2 22  GLUCOSE 380*  BUN 28*  CREATININE 1.25*  CALCIUM  10.2  GFRNONAA 47*  ANIONGAP 14    No results for input(s): PROT, ALBUMIN, AST, ALT, ALKPHOS, BILITOT in the last 168 hours. Lipids No results for input(s): CHOL, TRIG, HDL, LABVLDL, LDLCALC, CHOLHDL in the last 168 hours.  Hematology Recent Labs  Lab 05/01/24 2346  WBC 9.1  RBC 4.72  HGB 14.5  HCT 43.4  MCV 91.9  MCH 30.7  MCHC 33.4  RDW 13.2  PLT 279   Thyroid  No results for input(s): TSH, FREET4 in the last 168 hours.  BNPNo results for input(s): BNP, PROBNP in the last 168 hours.  DDimer No results for input(s): DDIMER in the last 168 hours.  Radiology/Studies:  DG Chest Port 1 View Result Date: 05/01/2024 CLINICAL DATA:  cp EXAM: PORTABLE CHEST 1 VIEW COMPARISON:  Chest x-ray 02/16/2023, CT cardiac 03/01/2023 FINDINGS: The heart and mediastinal contours are within normal limits. No focal consolidation. No  pulmonary edema. No pleural effusion. No pneumothorax. No acute osseous abnormality. IMPRESSION: No active disease. Electronically Signed   By: Morgane  Naveau M.D.   On: 05/01/2024 23:54     Assessment and Plan: NSTEMI CAD  HLD  Lexiscan  2019: low risk, normal Cath 2022: nonobstructive CAD CCTA w/ FFR: CAC score 153 at 83 percentile, moderate (50-69%) calcified plaque in the LAD and LCX. FFRct findings are consistent with significant stenosis in the mid LAD ECHO 02/2023: 60-65%, G1DD, trivial MV regurgitation, mild AV stenosis Cath 03/2023: 35% RCA, 1st Mrg 60%, LAD 60%, mild elevated LV filling pressurem mild pulmonary HTN. s/p DES in 03/2023  to mid LAD with IVUS guidance. Abrupt occlusion of diagonal that was jailed by stent, Successfully treated with POBA Reports typical chest pain that worsens with exertion.  Associated with worsening SOB/fatigue/dizziness, nausea and numbness to left forearm. Pain is similar to previous AS event.  Currently, denies any chest pain. Notes resolution after morphine  and NTG, however CP did return while in ED but resolved after additional meds.  Patient is not very active, however notes similar presentation when w TN 33 >47. EKG without any significant change.  Treated with IV morphine , IV Zofran  and NTG. Heparin  per pharm pending but not started yet.  ECHO pending.  Based on presentation, would consider for non urgent cath. MD agrees but INR is 2.7. Will notify hospitalist to proceed with transfer and once INR stable then can reschedule cath.  Continue Crestor  40 mg, Plavix  75 mg, ASA 81 mg With soft BP and recent morphine , would hold imdur  this am.   AKI Cr 1.25 (baseline 1.0-1.1) Continue to monitor  HTN  Initial BP mildly elevated but BP this am soft: 105/60 Continue to monitor   PAF No signs of recurrent afib.  Hold Warfarin. Heparin  per pharmacy pending but not started yet.   OSA  Not compliant with CPAP daily for past 1 month due to  discomfort.   Risk Assessment/Risk Scores:    TIMI Risk Score for Unstable Angina or Non-ST Elevation MI:   The patient's TIMI risk score is 6, which indicates a 41% risk of all cause mortality, new or recurrent myocardial infarction or need for urgent revascularization in the next 14 days.    CHA2DS2-VASc  Score = 5   This indicates a 7.2% annual risk of stroke. The patient's score is based upon: CHF History: 0 HTN History: 1 Diabetes History: 1 Stroke History: 0 Vascular Disease History: 1 Age Score: 1 Gender Score: 1        For questions or updates, please contact [facility] Please consult [email or web address] for contact info under      Signed, Lorette CINDERELLA Kapur, PA-C  05/02/2024 8:39 AM   Attending Note   Patient seen and discussed with PA Kapur, I agree with her documentation. 68 yo female history of CAD with prior DES to LAD in 03/2023,HLD, HTN, PAF, OSA, presents with chest pain. Intermittent symptoms over the last week increasing in frequency, left sided pain described as similar to her prior angina.    WBC 9.1 Hgb 14.5 Plt 279 K 3.8 BUN 28 Cr 1.25 INR 2.7  Trop 33-->47 CXR no acute process EKG SR, mild inferior and lateral precordial ST depressions.   03/2023 RHC/LHC: LM patent, prox LAD 60%, D1 40%, LCX patent, OM1 60% abnormal by RFR, prox RCA 35%. Received DES to LAD   1.NSTEMI - history of stent to LAD in 04/16/2023. Has been on plavix  and coumadin  given stent and afib, DOACs too expensive. Hold coumadin , can add ASA to her plavix  for time being, heparin  when INR <2. Final decision on antiplatet/anticoag after cath.  - presents with chest pain similar to her prior angina. Trops 33-->47 - EKG SR, mild inferior and lateral precordial ST depressions - echo pending  - plan for cath once INR has come down.Can heparnize once INR <2.  - medical therapy with plavix  (still on from stent 03/2023), asa, rosuvastatin . No beta blocker given bradycardia. Soft bp's  at times no ACE/ARB.   2. PAF - DOACs were too expensive, has been on coumadin  - has been self rate controlled  Dorn Ross MD

## 2024-05-02 NOTE — Assessment & Plan Note (Signed)
-   Continue high-dose statin therapy and fenofibrate .

## 2024-05-02 NOTE — Assessment & Plan Note (Addendum)
-   The patient will be admitted to an observation cardiac telemetry bed. - Will follow serial troponins and EKGs. - The patient will be placed on aspirin , Plavix  as well as p.r.n. sublingual nitroglycerin  and morphine  sulfate for pain. - High-dose statin will be provided. - We will obtain a cardiology consult in a.m. for further cardiac risk stratification. - Dr. Lorrane Blood was notified and is aware about the patient coming to [facility].  Per recommendation the patient will require cardiac catheterization upon arrival

## 2024-05-02 NOTE — Progress Notes (Signed)
 Transition of Care Department [facility]) has reviewed patient and no other TOC needs have been identified at this time. We will continue to monitor patient advancement through interdisciplinary progression rounds. If new patient transition needs arise, please place a TOC consult.   05/02/24 1017  TOC Brief Assessment  Insurance and Status Reviewed  Patient has primary care physician Yes  Home environment has been reviewed Lives with husband.  Prior level of function: Independent.  Prior/Current Home Services No current home services  Social Drivers of Health Review SDOH reviewed no interventions necessary  Readmission risk has been reviewed Yes  Transition of care needs no transition of care needs at this time

## 2024-05-02 NOTE — Inpatient Diabetes Management (Signed)
 Inpatient Diabetes Program Recommendations  AACE/ADA: New Consensus Statement on Inpatient Glycemic Control  Target Ranges:  Prepandial:   less than 140 mg/dL      Peak postprandial:   less than 180 mg/dL (1-2 hours)      Critically ill patients:  140 - 180 mg/dL    Latest Reference Range & Units 05/01/24 23:46  Glucose 70 - 99 mg/dL 619 (H)    Latest Reference Range & Units 07/14/23 10:20  HbA1c, POC (controlled diabetic range) 0.0 - 7.0 % 10.6    Review of Glycemic Control  Diabetes history: DM2 Outpatient Diabetes medications: Tresiba  110 units at bedtime, Humalog  16-22 units TID, Jardiance  25 mg daily, Ozempic  0.5 mg Qweek, Metfrmin 500 mg QPM Current orders for Inpatient glycemic control: Tresiba  110 units at bedtime, Jardiance  25 mg daily  Inpatient Diabetes Program Recommendations:    Insulin : Please consider ordering CBGs AC&HS with Novolog  0-20 units AC&HS. Please discontinue Tresiba  110 units at bedtime and consider ordering insulin  glargine 20 units Q24H.  NOTE: Patient currently still in ER, admitted with acute coronary syndrome. Patient has DM2 and lab glucose 380 mg/dl at 76:53 last night. No insulin  given since arrival to hospital. Noted patient sees Dr. Lenis; last seen 03/23/24 and was asked to increase Tresiba  to 110 units at bedtime, increase Novolog  to 16-22 units TID with meals, increase Jardiance  to 25 mg QAM, and continue Ozempic  0.5 mg Qweek.   Thanks, Earnie Gainer, RN, MSN, CDCES Diabetes Coordinator Inpatient Diabetes Program [phone] (Team Pager from 8am to 5pm)

## 2024-05-02 NOTE — Progress Notes (Addendum)
 PHARMACY - ANTICOAGULATION CONSULT NOTE  Pharmacy Consult for heparin  Indication: chest pain/ACS, atrial fibrillation (PTA warfarin)  Allergies  Allergen Reactions   Azithromycin Hives   Codeine Nausea Only   Atomoxetine Nausea And Vomiting   Atorvastatin Other (See Comments)    Restless legs   Lisinopril Nausea And Vomiting    Patient Measurements: Height: 5' 4 (162.6 cm) Weight: 98.6 kg (217 lb 6 oz) IBW/kg (Calculated) : 54.7 HEPARIN  DW (KG): 77.4  Vital Signs: Temp: 98.2 F (36.8 C) (09/15 2330) BP: 115/51 (09/16 0045) Pulse Rate: 51 (09/16 0045)  Labs: Recent Labs    05/01/24 2346 05/02/24 0200  HGB 14.5  --   HCT 43.4  --   PLT 279  --   CREATININE 1.25*  --   TROPONINIHS 33* 47*    Estimated Creatinine Clearance: 49.2 mL/min (A) (by C-G formula based on SCr of 1.25 mg/dL (H)).   Medical History: Past Medical History:  Diagnosis Date   ADD (attention deficit disorder)    Diabetes mellitus without complication (HCC)    Hypertension     Assessment: 69 yoF presented with chest pain. Pharmacy consulted to dose heparin  for ACS. PMH includes afib (on warfarin  -CBC stable -Trop 47 -PTA warfarin 2.5mg  PO every Mon, Thu; 5mg  PO all other days (last dose 9/13 or 9/14) -INR 2.7  Goal of Therapy:  Heparin  level 0.3-0.7 units/ml INR goal 2-3 Monitor platelets by anticoagulation protocol: Yes   Plan:  Start heparin  infusion once INR <2 Start heparin  infusion at 1000 units/hr Check anti-Xa level in 6 hours and daily while on heparin  Continue to monitor H&H and platelets F/u cardiology consult  Lynwood Poplar, PharmD, BCPS Clinical Pharmacist 05/02/2024 2:58 AM

## 2024-05-02 NOTE — Assessment & Plan Note (Signed)
Continue CPAP at bedtime 

## 2024-05-02 NOTE — H&P (Addendum)
 [place]   PATIENT NAME: Rachel Odonnell    MR#:  [id number]  DATE OF BIRTH:  1956-03-23  DATE OF ADMISSION:  05/01/2024  PRIMARY CARE PHYSICIAN: Skillman, Katherine E, PA-C   Patient is coming from: Home  REQUESTING/REFERRING PHYSICIAN: Theadore Sharper, MD  CHIEF COMPLAINT:   Chief Complaint  Patient presents with   Chest Pain    HISTORY OF PRESENT ILLNESS:  CAROLLYNN Odonnell is a 68 y.o. female with medical history significant for type 2 diabetes mellitus, essential hypertension and ADD, who presented to the emergency room with acute onset of chest pain felt as sharp pain and graded 8/10 in severity with radiation to his left axillary area and both jaws.  The patient has been having intermittent chest pain over the last few days.  He admitted to associated dyspnea, nausea without vomiting or diaphoresis with associated palpitations.  She denied any cough or wheezing or hemoptysis.  No fever or chills. No abdominal pain or melena or bright red bleeding per rectum.  No dysuria, oliguria or hematuria or flank pain. ED Course: When the patient came to the ER, BP was 147/55 with heart rate of 56 with otherwise normal vital signs.  Labs revealed INR 2.7 and PTT 29.8 on Coumadin , blood glucose of 380.  CBC was within normal.  High sensitive troponin I was 33 and later 47.  Sodium was 133 and potassium 3.8 with chloride 97.  BUN is 28 and creatinine 1.25 above previous levels. EKG as reviewed by me : EKG showed normal sinus rhythm with a rate of EKG showed normal sinus rhythm with a rate of 54 with borderline prolonged PR interval and prolonged QT interval with QTc of 526 MS. Imaging: Portable chest x-ray showed no acute cardiopulmonary disease.  The patient was given 4 mg of IV morphine  sulfate and 4 mg of IV Zofran  as well as 0.4 mg of sublingual nitroglycerin .  She will be admitted to the cardiac telemetry bed for further evaluation and management. PAST MEDICAL HISTORY:   Past Medical  History:  Diagnosis Date   ADD (attention deficit disorder)    Diabetes mellitus without complication (HCC)    Hypertension     PAST SURGICAL HISTORY:   Past Surgical History:  Procedure Laterality Date   ABDOMINAL HYSTERECTOMY     COLONOSCOPY  02/2022   CORONARY PRESSURE/FFR STUDY N/A 04/16/2023   Procedure: CORONARY PRESSURE/FFR STUDY;  Surgeon: Swaziland, Peter M, MD;  Location: [facility] INVASIVE CV LAB;  Service: Cardiovascular;  Laterality: N/A;   CORONARY STENT INTERVENTION N/A 04/16/2023   Procedure: CORONARY STENT INTERVENTION;  Surgeon: Swaziland, Peter M, MD;  Location[facility] INVASIVE CV LAB;  Service: Cardiovascular;  Laterality: N/A;   CORONARY ULTRASOUND/IVUS N/A 04/16/2023   Procedure: Coronary Ultrasound/IVUS;  Surgeon: Swaziland, Peter M, MD;  Location: [facility] INVASIVE CV LAB;  Service: Cardiovascular;  Laterality: N/A;   EXCISIONAL HEMORRHOIDECTOMY     INCONTINENCE SURGERY     RIGHT/LEFT HEART CATH AND CORONARY ANGIOGRAPHY N/A 06/13/2021   Procedure: RIGHT/LEFT HEART CATH AND CORONARY ANGIOGRAPHY;  Surgeon: Swaziland, Peter M, MD;  Location: [facility] INVASIVE CV LAB;  Service: Cardiovascular;  Laterality: N/A;   RIGHT/LEFT HEART CATH AND CORONARY ANGIOGRAPHY N/A 04/16/2023   Procedure: RIGHT/LEFT HEART CATH AND CORONARY ANGIOGRAPHY;  Surgeon: Swaziland, Peter M, MD;  Location: [facility] INVASIVE CV LAB;  Service: Cardiovascular;  Laterality: N/A;    SOCIAL HISTORY:   Social History   Tobacco Use   Smoking status: Former  Current packs/day: 0.00    Types: Cigarettes    Quit date: 08/17/1994    Years since quitting: 29.7   Smokeless tobacco: Never  Substance Use Topics   Alcohol  use: No    FAMILY HISTORY:   Family History  Problem Relation Age of Onset   Cancer Mother    Diabetes Father    Heart Problems Father    COPD Father    Emphysema Father    Heart block Father    Congestive Heart Failure Father     DRUG ALLERGIES:   Allergies  Allergen Reactions   Azithromycin Hives   Codeine  Nausea Only   Atomoxetine Nausea And Vomiting   Atorvastatin Other (See Comments)    Restless legs   Lisinopril Nausea And Vomiting    REVIEW OF SYSTEMS:   ROS As per history of present illness. All pertinent systems were reviewed above. Constitutional, HEENT, cardiovascular, respiratory, GI, GU, musculoskeletal, neuro, psychiatric, endocrine, integumentary and hematologic systems were reviewed and are otherwise negative/unremarkable except for positive findings mentioned above in the HPI.   MEDICATIONS AT HOME:   Prior to Admission medications   Medication Sig Start Date End Date Taking? Authorizing Provider  Accu-Chek Softclix Lancets lancets Use as instructed to test blood glucose four times daily 06/09/22   Nida, Gebreselassie W, MD  busPIRone  (BUSPAR ) 5 MG tablet Take 5 mg by mouth at bedtime. 01/27/22   [provider]  Cholecalciferol  (VITAMIN D3) 125 MCG (5000 UT) CAPS Take 1 capsule (5,000 Units total) by mouth daily. 12/16/21   Nida, Gebreselassie W, MD  citalopram  (CELEXA ) 20 MG tablet Take 1 tablet (20 mg total) by mouth every evening. 06/09/23   Miriam Norris, NP  clopidogrel  (PLAVIX ) 75 MG tablet TAKE 1 TABLET BY MOUTH DAILY 11/05/23   Miriam Norris, NP  Continuous Glucose Receiver (FREESTYLE LIBRE 2 READER) DEVI USE TO CHECK GLUCOSE AS DIRECTED 12/04/22   Nida, Ethelle ORN, MD  Continuous Glucose Sensor (FREESTYLE LIBRE 2 SENSOR) MISC CHANGE SENSOR EVERY 14 DAYS USE  TO CHECK BLOOD GLUCOSE  CONTINUOUSLY 11/01/23   Lenis Ethelle ORN, MD  donepezil  (ARICEPT ) 5 MG tablet Take 5 mg by mouth every evening. 08/06/22   [provider]  empagliflozin  (JARDIANCE ) 25 MG TABS tablet Take 1 tablet (25 mg total) by mouth daily. 03/23/24   Nida, Gebreselassie W, MD  estradiol  (ESTRACE ) 0.1 MG/GM vaginal cream Discard plastic applicator. Insert a blueberry size amount (approximately 1 gram) of cream on fingertip inside vagina at bedtime every night for 1 week then  every other night. For long term use. 08/03/23   Gerldine Lauraine BROCKS, FNP  estradiol  (ESTRACE ) 2 MG tablet Take 1 mg by mouth daily. 09/14/23   [provider]  fenofibrate  160 MG tablet Take 1 tablet (160 mg total) by mouth daily. 07/06/23   Miriam Norris, NP  gabapentin  (NEURONTIN ) 100 MG capsule Take 1 capsule (100 mg total) by mouth at bedtime. 04/17/23   Dunn, Dayna N, PA-C  glucose blood test strip 1 each by Other route as needed. Use as instructed to test blood glucose four times daily 06/09/22   Nida, Gebreselassie W, MD  indapamide  (LOZOL ) 2.5 MG tablet Take 1 tablet (2.5 mg total) by mouth daily. 02/21/24   McKenzie, Belvie CROME, MD  insulin  degludec (TRESIBA  FLEXTOUCH) 200 UNIT/ML FlexTouch Pen Inject 110 Units into the skin at bedtime. 03/23/24   Nida, Gebreselassie W, MD  insulin  lispro (HUMALOG  KWIKPEN) 100 UNIT/ML KwikPen Inject 16-22 Units into the  skin 3 (three) times daily before meals. 03/27/24   Nida, Gebreselassie W, MD  Insulin  Pen Needle (B-D ULTRAFINE III SHORT PEN) 31G X 8 MM MISC 1 each by Does not apply route as directed. 01/06/22   Nida, Gebreselassie W, MD  isosorbide  mononitrate (IMDUR ) 60 MG 24 hr tablet Take 1.5 tablets (90 mg total) by mouth daily. Can take an additional half tab as needed for chest pain 05/25/23   Miriam Norris, NP  levocetirizine (XYZAL ) 5 MG tablet Take 5 mg by mouth every evening.    [provider]  levothyroxine  (SYNTHROID ) 50 MCG tablet TAKE 1 TABLET BY MOUTH DAILY  BEFORE BREAKFAST 11/11/23   Nida, Gebreselassie W, MD  Magnesium  Oxide (MAGNESIUM  OXIDE 400) 240 MG PACK Take 400 mg by mouth daily. 07/19/23   Miriam Norris, NP  metFORMIN  (GLUCOPHAGE ) 500 MG tablet TAKE 1 TABLET BY MOUTH DAILY  WITH BREAKFAST 11/11/23   Nida, Gebreselassie W, MD  modafinil  (PROVIGIL ) 100 MG tablet Take 1 tablet (100 mg total) by mouth daily. 03/02/24   Jude Harden GAILS, MD  montelukast  (SINGULAIR ) 10 MG tablet Take 10 mg by mouth at bedtime.    [provider]  Multiple Vitamin (MULTIVITAMIN ADULT PO) Take 1 tablet by mouth daily.    [provider]  nitroGLYCERIN  (NITROSTAT ) 0.4 MG SL tablet Place 1 tablet (0.4 mg total) under the tongue every 5 (five) minutes as needed for chest pain. 05/01/24   Miriam Norris, NP  OVER THE COUNTER MEDICATION Take 2 capsules by mouth daily. Omega XL    [provider]  pantoprazole  (PROTONIX ) 40 MG tablet TAKE 2 TABLETS EVERY DAY 08/19/23   Miriam Norris, NP  rOPINIRole  (REQUIP ) 2 MG tablet Take 2 mg by mouth at bedtime. 06/22/23   [provider]  rosuvastatin  (CRESTOR ) 40 MG tablet Take 1 tablet (40 mg total) by mouth daily. 04/17/23   Dunn, Dayna N, PA-C  Semaglutide ,0.25 or 0.5MG /DOS, (OZEMPIC , 0.25 OR 0.5 MG/DOSE,) 2 MG/1.5ML SOPN Inject 0.5 mg into the skin once a week.    [provider]  Vibegron  (GEMTESA ) 75 MG TABS Take 1 tablet (75 mg total) by mouth daily. 10/11/23   Gerldine Lauraine BROCKS, FNP  Vibegron  (GEMTESA ) 75 MG TABS Take 1 tablet (75 mg total) by mouth daily. 12/15/23   Gerldine Lauraine BROCKS, FNP  Vibegron  (GEMTESA ) 75 MG TABS Take 1 tablet (75 mg total) by mouth daily. 02/01/24   Larocco, [name] C, FNP  warfarin (COUMADIN ) 5 MG tablet TAKE ONE-HALF TABLET BY MOUTH  DAILY EXCEPT 1 TABLET BY MOUTH  ON SUNDAYS, TUESDAYS, THURSDAYS, SATURDAYS OR AS DIRECTED 05/01/24   Alvan Dorn FALCON, MD      VITAL SIGNS:  Blood pressure (!) 109/39, pulse (!) 51, temperature 97.9 F (36.6 C), resp. rate 16, height 5' 4 (1.626 m), weight 98.6 kg, SpO2 95%.  PHYSICAL EXAMINATION:  Physical Exam  GENERAL:  68 y.o.-year-old Caucasian female patient lying in the bed with no acute distress.  EYES: Pupils equal, round, reactive to light and accommodation. No scleral icterus. Extraocular muscles intact.  HEENT: Head atraumatic, normocephalic. Oropharynx and nasopharynx clear.  NECK:  Supple, no jugular venous distention. No thyroid  enlargement, no tenderness.  LUNGS: Normal breath  sounds bilaterally, no wheezing, rales,rhonchi or crepitation. No use of accessory muscles of respiration.  CARDIOVASCULAR: Regular rate and rhythm, S1, S2 normal. No murmurs, rubs, or gallops.  ABDOMEN: Soft, nondistended, nontender. Bowel sounds present. No organomegaly or mass.  EXTREMITIES: No pedal  edema, cyanosis, or clubbing.  NEUROLOGIC: Cranial nerves II through XII are intact. Muscle strength 5/5 in all extremities. Sensation intact. Gait not checked.  PSYCHIATRIC: The patient is alert and oriented x 3.  Normal affect and good eye contact. SKIN: No obvious rash, lesion, or ulcer.   LABORATORY PANEL:   CBC Recent Labs  Lab 05/01/24 2346  WBC 9.1  HGB 14.5  HCT 43.4  PLT 279   ------------------------------------------------------------------------------------------------------------------  Chemistries  Recent Labs  Lab 05/01/24 2346  NA 133*  K 3.8  CL 97*  CO2 22  GLUCOSE 380*  BUN 28*  CREATININE 1.25*  CALCIUM  10.2   ------------------------------------------------------------------------------------------------------------------  Cardiac Enzymes No results for input(s): TROPONINI in the last 168 hours. ------------------------------------------------------------------------------------------------------------------  RADIOLOGY:  DG Chest Port 1 View Result Date: 05/01/2024 CLINICAL DATA:  cp EXAM: PORTABLE CHEST 1 VIEW COMPARISON:  Chest x-ray 02/16/2023, CT cardiac 03/01/2023 FINDINGS: The heart and mediastinal contours are within normal limits. No focal consolidation. No pulmonary edema. No pleural effusion. No pneumothorax. No acute osseous abnormality. IMPRESSION: No active disease. Electronically Signed   By: Morgane  Naveau M.D.   On: 05/01/2024 23:54      IMPRESSION AND PLAN:  Assessment and Plan: * ACS (acute coronary syndrome) (HCC) - The patient will be admitted to an observation cardiac telemetry bed. - Will follow serial troponins and  EKGs. - The patient will be placed on aspirin , Plavix  as well as p.r.n. sublingual nitroglycerin  and morphine  sulfate for pain. - High-dose statin will be provided. - We will obtain a cardiology consult in a.m. for further cardiac risk stratification. - Dr. Lorrane Blood was notified and is aware about the patient coming to [facility].  Per recommendation the patient will require cardiac catheterization upon arrival  Type 2 diabetes mellitus with peripheral neuropathy (HCC) - The patient will be placed on supplemental coverage with NovoLog . - Will hold off metformin . - Will continue basal coverage. - Will continue Neurontin .  Anxiety and depression - Will continue Xanax , BuSpar  and Celexa .  OSA (obstructive sleep apnea) Continue CPAP at bedtime.  Dyslipidemia - Continue high-dose statin therapy and fenofibrate .   DVT prophylaxis: IV heparin  when INR is below 2. Advanced Care Planning:  Code Status: full code. Family Communication:  The plan of care was discussed in details with the patient (and family). I answered all questions. The patient agreed to proceed with the above mentioned plan. Further management will depend upon hospital course. Disposition Plan: Back to previous home environment Consults called: Cardiology. All the records are reviewed and case discussed with ED provider.  Status is: Inpatient  At the time of the admission, it appears that the appropriate admission status for this patient is inpatient.  This is judged to be reasonable and necessary in order to provide the required intensity of service to ensure the patient's safety given the presenting symptoms, physical exam findings and initial radiographic and laboratory data in the context of comorbid conditions.  The patient requires inpatient status due to high intensity of service, high risk of further deterioration and high frequency of surveillance required.  I certify that at the time of admission, it is my clinical  judgment that the patient will require inpatient hospital care extending more than 2 midnights.                            Dispo: The patient is from: Home  Anticipated d/c is to: Home              Patient currently is not medically stable to d/c.              Difficult to place patient: No  Madison DELENA Peaches M.D on 05/02/2024 at 5:43 AM  [facility]   From 7 PM-7 AM, contact night-coverage [email or web address]  CC: Primary care physician; Skillman, Katherine E, PA-C

## 2024-05-02 NOTE — Progress Notes (Signed)
 [facility] PROGRESS NOTE  Rachel Odonnell (DOB: 15-Aug-1956) FMW:[id number]  Brief Narrative/Subjective: Rachel Odonnell is a 68 y.o. female with a history of CAD s/p DES to LAD Aug 2024 complicated by diagonal occlusion treated with balloon angioplasty, PAF on coumadin , OSA on CPAP, HTN, HLD, T2DM, hypothyroidism, and obesity (BMI 37) who presented to the ED on 05/01/2024 with chest pain with increasing severity and frequency over a couple days with associated dyspnea. ECG revealed nonspecific T wave abnormalities, borderline ST depressions, troponin mildly elevated and rising. Cardiology recommended admission to [facility] under the hospitalist service for cardiac catheterization. Her chest pain this morning was resolved with 2nd dose of morphine .   Objective: BP (!) 130/55   Pulse (!) 56   Temp 97.9 F (36.6 C)   Resp 16   Ht 5' 4 (1.626 m)   Wt 98.6 kg   SpO2 94%   BMI 37.31 kg/m   Gen: Nontoxic obese female in no distress Pulm: Nonlabored, clear  CV: Regular bradycardia, no MRG, no pitting edema GI: Soft, NT, ND, +BS   Assessment & Plan: ACS in patient with CAD, HTN, HLD, T2DM, obesity:  - D/w cardiology. INR currently too high for cardiac catheterization. Heparin  will be started once INR < 2 (currently therapeutic). Will continue with plan for admission to [facility] once telemetry bed available (confirmed this AM none were open at that time).  - Continue DAPT, rosuvastatin  40mg  - Continue morphine  IV prn, NTG - Echo pending - Defer other medications to cardiology, thus far continued on indapamide , empagliflozin .   IDT2DM: Glucose 380 last night, no Tx yet given.  - Hold metformin  - Will start AC/HS checks, institute basal-bolus insulin , decrease home basal to 25u daily (~25-30% home dose) with anticipation of need to augment dosing. Start resistant SSI.   Anxiety/depression/ADHD:  - Continue home medications  PAF: Current sinus bradycardia.  - Holding coumadin  as above.  - Continue  telemetry - Check TSH and continue synthroid .    AKI: Mild, hydrate and monitor.   OSA: Recommend CPAP  [name], MD [facility] [email or web address] 05/02/2024, 10:03 AM

## 2024-05-02 NOTE — Progress Notes (Signed)
*  PRELIMINARY RESULTS* Echocardiogram 2D Echocardiogram has been performed.  Kieli, Golladay 05/02/2024, 10:33 AM

## 2024-05-03 ENCOUNTER — Other Ambulatory Visit: Payer: Self-pay

## 2024-05-03 DIAGNOSIS — I214 Non-ST elevation (NSTEMI) myocardial infarction: Secondary | ICD-10-CM | POA: Diagnosis not present

## 2024-05-03 DIAGNOSIS — I249 Acute ischemic heart disease, unspecified: Secondary | ICD-10-CM | POA: Diagnosis not present

## 2024-05-03 LAB — CBC
HCT: 43.1 % (ref 36.0–46.0)
HCT: 42.5 % (ref 36.0–46.0)
Hemoglobin: 14.3 g/dL (ref 12.0–15.0)
Hemoglobin: 14 g/dL (ref 12.0–15.0)
MCH: 30.6 pg (ref 26.0–34.0)
MCH: 30.4 pg (ref 26.0–34.0)
MCHC: 33.2 g/dL (ref 30.0–36.0)
MCHC: 32.9 g/dL (ref 30.0–36.0)
MCV: 92.1 fL (ref 80.0–100.0)
MCV: 92.2 fL (ref 80.0–100.0)
Platelets: 265 K/uL (ref 150–400)
Platelets: 266 K/uL (ref 150–400)
RBC: 4.68 MIL/uL (ref 3.87–5.11)
RBC: 4.61 MIL/uL (ref 3.87–5.11)
RDW: 13.1 % (ref 11.5–15.5)
RDW: 13.1 % (ref 11.5–15.5)
WBC: 8.1 K/uL (ref 4.0–10.5)
WBC: 9.2 K/uL (ref 4.0–10.5)
nRBC: 0 % (ref 0.0–0.2)
nRBC: 0 % (ref 0.0–0.2)

## 2024-05-03 LAB — PROTIME-INR
INR: 2.8 — ABNORMAL HIGH (ref 0.8–1.2)
INR: 2.7 — ABNORMAL HIGH (ref 0.8–1.2)
Prothrombin Time: 31.1 s — ABNORMAL HIGH (ref 11.4–15.2)
Prothrombin Time: 30.3 s — ABNORMAL HIGH (ref 11.4–15.2)

## 2024-05-03 LAB — GLUCOSE, CAPILLARY
Glucose-Capillary: 154 mg/dL — ABNORMAL HIGH (ref 70–99)
Glucose-Capillary: 95 mg/dL (ref 70–99)
Glucose-Capillary: 95 mg/dL (ref 70–99)
Glucose-Capillary: 198 mg/dL — ABNORMAL HIGH (ref 70–99)
Glucose-Capillary: 173 mg/dL — ABNORMAL HIGH (ref 70–99)

## 2024-05-03 LAB — LIPID PANEL
Cholesterol: 149 mg/dL (ref 0–200)
HDL: 34 mg/dL — ABNORMAL LOW (ref >40)
LDL Cholesterol: 75 mg/dL (ref 0–99)
Total CHOL/HDL Ratio: 4.4 ratio
Triglycerides: 201 mg/dL — ABNORMAL HIGH (ref <150)
VLDL: 40 mg/dL (ref 0–40)

## 2024-05-03 LAB — TROPONIN I (HIGH SENSITIVITY)
Troponin I (High Sensitivity): 34 ng/L — ABNORMAL HIGH (ref <18)
Troponin I (High Sensitivity): 44 ng/L — ABNORMAL HIGH (ref <18)

## 2024-05-03 LAB — BASIC METABOLIC PANEL WITH GFR
Anion gap: 8 (ref 5–15)
BUN: 21 mg/dL (ref 8–23)
CO2: 27 mmol/L (ref 22–32)
Calcium: 9.7 mg/dL (ref 8.9–10.3)
Chloride: 102 mmol/L (ref 98–111)
Creatinine, Ser: 1.54 mg/dL — ABNORMAL HIGH (ref 0.44–1.00)
GFR, Estimated: 37 mL/min — ABNORMAL LOW (ref >60)
Glucose, Bld: 150 mg/dL — ABNORMAL HIGH (ref 70–99)
Potassium: 3.8 mmol/L (ref 3.5–5.1)
Sodium: 137 mmol/L (ref 135–145)

## 2024-05-03 LAB — TSH: TSH: 1.487 u[IU]/mL (ref 0.350–4.500)

## 2024-05-03 MED ORDER — FENTANYL CITRATE PF 50 MCG/ML IJ SOSY
25.0000 ug | PREFILLED_SYRINGE | INTRAMUSCULAR | Status: DC | PRN
  Administered 2024-05-03 – 2024-05-04 (×8): 50 ug via INTRAVENOUS
  Administered 2024-05-05: 25 ug via INTRAVENOUS
  Administered 2024-05-05 (×3): 50 ug via INTRAVENOUS
  Filled 2024-05-03 (×12): qty 1

## 2024-05-03 MED ORDER — FENTANYL CITRATE PF 50 MCG/ML IJ SOSY: 25.0000 ug | PREFILLED_SYRINGE | INTRAMUSCULAR | Status: DC | PRN

## 2024-05-03 MED ORDER — OXYCODONE HCL 5 MG PO TABS
5.0000 mg | ORAL_TABLET | ORAL | Status: DC | PRN
  Administered 2024-05-04 – 2024-05-05 (×5): 5 mg via ORAL
  Filled 2024-05-03 (×5): qty 1

## 2024-05-03 MED ORDER — SODIUM CHLORIDE 0.9 % IV SOLN
INTRAVENOUS | Status: DC
Start: 2024-05-03 — End: 2024-05-04

## 2024-05-03 NOTE — Hospital Course (Addendum)
 Rachel Odonnell is a 68 y.o. female with PMH of  CAD s/p DES to LAD Aug 2024 complicated by diagonal occlusion treated with balloon angioplasty, PAF on coumadin , OSA on CPAP, HTN, HLD, T2DM, hypothyroidism, and obesity (BMI 37) who presented to the ED on 05/01/2024 with chest pain with increasing severity and frequency over a couple days with associated dyspnea. ECG revealed nonspecific T wave abnormalities, borderline ST depressions, troponin mildly elevated and rising. Cardiology recommended admission to [facility] under the hospitalist service for cardiac catheterization.  Subjective: Seen and examined today About to have her meal.  Reports she had some chest pain last night, not currently Overnight heart rate 40s-50s, blood pressure 110, saturating well on room air Labs reviewed creatinine at 1.5 from 1.2, LDL 75 CBC stable INR 2.8  Troponins were 47> 33>34 on admit  Assessment and plan:  NSTEMI CAD HLD: Cardiology following for cardiac catheter.Lipid panel stable.  Relatively flat troponin. Continue aspirin  81, Plavix  75, Crestor  40. Continue morphine  IV prn, NTG Echo 9/16 showed EF 70 to 75% no RWMA, RV SF normal mitral valve aortic valve stable Cardiology input appreciated plan for cath once INR<2 likely Friday  T2DM insulin -dependent with uncontrolled hyperglycemia: Hold metformin , cont SSI,  and reduced home Basal to 25u daily (~25-30% home dose) titrate meds as she starts to eat and based upon blood sugar.  Diabetes control has been consulted.   Recent Labs  Lab 05/02/24 1832 05/02/24 2102 05/03/24 0611 05/03/24 1003 05/03/24 1104  GLUCAP 220* 249* 154* 95 95    Anxiety Depression ADHD:  Continue home meds.   PAF: In NSR. Holding coumadin  as above.  INR therapeutic Recent Labs  Lab 05/01/24 2346 05/02/24 1524 05/03/24 0350 05/03/24 0915  INR 2.7* 2.9* 2.8* 2.7*    Hypothyroidism: Euthyroid,continue synthroid .     AKI: Creatinine slightly trending up, hydrate and  monitor.  Recent Labs    07/07/23 1429 09/17/23 1135 05/01/24 2346 05/03/24 0350  BUN 17 22 28* 21  CREATININE 1.02* 1.03* 1.25* 1.54*  CO2 24 21* 22 27  K 3.8 4.0 3.8 3.8     Class II Obesity w/ Body mass index is 37.88 kg/m.  Suspect OSA: Will benefit with PCP follow-up, weight loss,healthy lifestyle and outpatient sleep eval   DVT prophylaxis:  Code Status:   Code Status: Full Code Family Communication: plan of care discussed with patient at bedside. Patient status is: Remains hospitalized because of severity of illness Level of care: Telemetry Cardiac   Dispo: The patient is from: Home            Anticipated disposition: TBD Objective: Vitals last 24 hrs: Vitals:   05/03/24 0411 05/03/24 0742 05/03/24 0743 05/03/24 1100  BP: (!) 119/44 (!) 113/46 (!) 113/46 (!) 126/52  Pulse: (!) 49 (!) 50 (!) 50 (!) 53  Resp: 15 11 14 13   Temp: 98.3 F (36.8 C) 98.2 F (36.8 C)  97.8 F (36.6 C)  TempSrc: Oral Oral  Oral  SpO2: 95% 97% 94% 94%  Weight:      Height:        Physical Examination: General exam: alert awake, oriented, older than stated age HEENT:Oral mucosa moist, Ear/Nose WNL grossly Respiratory system: Bilaterally clear BS,no use of accessory muscle Cardiovascular system: S1 & S2 +, No JVD. Gastrointestinal system: Abdomen soft,NT,ND, BS+ Nervous System: Alert, awake, moving all extremities,and following commands. Extremities: extremities warm, leg edema neg Skin: No rashes,no icterus. MSK: Normal muscle bulk,tone, power   Medications reviewed:  Scheduled Meds:  aspirin  EC  81 mg Oral Daily   cholecalciferol   5,000 Units Oral Daily   citalopram   20 mg Oral QPM   clopidogrel   75 mg Oral Daily   donepezil   5 mg Oral QPM   fenofibrate   160 mg Oral Daily   gabapentin   100 mg Oral QHS   insulin  aspart  0-20 Units Subcutaneous TID WC   insulin  aspart  0-5 Units Subcutaneous QHS   insulin  glargine  25 Units Subcutaneous Daily   levothyroxine   50 mcg Oral  Q0600   loratadine   10 mg Oral q1800   magnesium  oxide  400 mg Oral Daily   modafinil   100 mg Oral Daily   montelukast   10 mg Oral QHS   multivitamin with minerals  1 tablet Oral Daily   pantoprazole   80 mg Oral Daily   rOPINIRole   2 mg Oral QHS   rosuvastatin   40 mg Oral Daily   Continuous Infusions:  sodium chloride      Diet: Diet Order             Diet heart healthy/carb modified Room service appropriate? Yes; Fluid consistency: Thin  Diet effective now

## 2024-05-03 NOTE — Progress Notes (Signed)
   Informed that this patient was having recurring chest pain. She reported jaw pain that then radiated to her chest. She tells me it feels like her sternum is breaking. She was at rest while this pain was taking place. She was given morphine  2 mg x 1 dose and SL NTG x 2 doses. When I examined her, around 1 PM, she was resting, fairly comfortably and tells me that her current pain has reduced to 3/10. She notes that at its worst, it was 8/10. I explained that with her INR still above goal it would be best to wait until it is < 2 prior to taking her for cath, she agrees with this plan, especially noting that with her previous heart catheterization she had to be admitted due to a hematoma post procedure.  Repeat EKG was taken during her event.  When compared to prior EKGs, there were no acute ischemic changes.   Will continue with pain control using PRN morphine  and sublingual nitroglycerin .  Will reassess if the pain worsens, remains constant.  [name] DELENA Donath, PA-C 05/03/2024 1:08 PM

## 2024-05-03 NOTE — Progress Notes (Signed)
 PHARMACY - ANTICOAGULATION CONSULT NOTE  Pharmacy Consult for Heparin  when INR < 2 Indication: chest pain/ACS and atrial fibrillation (holding PTA Warfarin)  Allergies  Allergen Reactions   Azithromycin Hives   Codeine Nausea Only   Atomoxetine Nausea And Vomiting   Atorvastatin Other (See Comments)    Restless legs   Lisinopril Nausea And Vomiting    Patient Measurements: Height: 5' 4 (162.6 cm) Weight: 100.1 kg (220 lb 10.9 oz) IBW/kg (Calculated) : 54.7 HEPARIN  DW (KG): 77.9  Vital Signs: Temp: 97.8 F (36.6 C) (09/17 1100) Temp Source: Oral (09/17 1100) BP: 126/52 (09/17 1100) Pulse Rate: 53 (09/17 1100)  Labs: Recent Labs    05/01/24 2346 05/02/24 0200 05/02/24 1524 05/03/24 0350 05/03/24 0915  HGB 14.5  --   --  14.3 14.0  HCT 43.4  --   --  43.1 42.5  PLT 279  --   --  265 266  LABPROT 29.8*  --  31.9* 31.1* 30.3*  INR 2.7*  --  2.9* 2.8* 2.7*  CREATININE 1.25*  --   --  1.54*  --   TROPONINIHS 33* 47* 34*  --   --     Estimated Creatinine Clearance: 40.2 mL/min (A) (by C-G formula based on SCr of 1.54 mg/dL (H)).  Assessment: 27 yoF presented with chest pain. Pharmacy consulted to dose heparin  for ACS. PMH includes afib and on warfarin PTA. Holding warfarin for cardiac cath when INR down.  PTA warfarin 2.5 mg PO every Mon, Thu; 5mg  PO all other days (last dose 9/15 at 2130) INR remains therapeutic at 2.7.   Goal of Therapy:  Heparin  level 0.3-0.7 units/ml Monitor platelets by anticoagulation protocol: Yes   Plan:  To begin IV heparin  when INR <2.0. Warfarin on hold. Daily PT/INR.   Genaro Zebedee Calin, RPh 05/03/2024,12:42 PM

## 2024-05-03 NOTE — Progress Notes (Signed)
 patient was complaining of jaw pain that started to radiate to chest. Gave patient prn morphine  and patient stated that her pain started to decrease. Patient later called me into her room stating that she feels like her sternum is going to break with pain radiating to jaw and arm. One dose of Nitroglycerin  given while EKG was being completed by NT. Patient stated pain had reduced from 8 to 5 but was still in pain, second dose of nitroglycerin  given and PA was paged, PA in route to bedside. Will continue to monitor.

## 2024-05-03 NOTE — Plan of Care (Signed)
  Problem: Education: Goal: Understanding of cardiac disease, CV risk reduction, and recovery process will improve Outcome: Progressing   Problem: Education: Goal: Knowledge of General Education information will improve Description: Including pain rating scale, medication(s)/side effects and non-pharmacologic comfort measures Outcome: Progressing   Problem: Clinical Measurements: Goal: Will remain free from infection Outcome: Progressing   Problem: Activity: Goal: Risk for activity intolerance will decrease Outcome: Progressing   Problem: Nutrition: Goal: Adequate nutrition will be maintained Outcome: Progressing

## 2024-05-03 NOTE — Progress Notes (Signed)
 PROGRESS NOTE Rachel Odonnell  FMW:[id number] DOB: [birth year] DOA: 05/01/2024 PCP: Jeanette Comer BRAVO, PA-C  Brief Narrative/Hospital Course: Rachel Odonnell is a 68 y.o. female with PMH of  CAD s/p DES to LAD Aug 2024 complicated by diagonal occlusion treated with balloon angioplasty, PAF on coumadin , OSA on CPAP, HTN, HLD, T2DM, hypothyroidism, and obesity (BMI 37) who presented to the ED on 05/01/2024 with chest pain with increasing severity and frequency over a couple days with associated dyspnea. ECG revealed nonspecific T wave abnormalities, borderline ST depressions, troponin mildly elevated and rising. Cardiology recommended admission to [facility] under the hospitalist service for cardiac catheterization.  Subjective: Seen and examined today About to have her meal.  Reports she had some chest pain last night, not currently Overnight heart rate 40s-50s, blood pressure 110, saturating well on room air Labs reviewed creatinine at 1.5 from 1.2, LDL 75 CBC stable INR 2.8  Troponins were 47> 33>34 on admit  Assessment and plan:  NSTEMI CAD HLD: Cardiology following for cardiac catheter.Lipid panel stable.  Relatively flat troponin. Continue aspirin  81, Plavix  75, Crestor  40. Continue morphine  IV prn, NTG Echo 9/16 showed EF 70 to 75% no RWMA, RV SF normal mitral valve aortic valve stable Cardiology input appreciated plan for cath once INR<2 likely Friday  T2DM insulin -dependent with uncontrolled hyperglycemia: Hold metformin , cont SSI,  and reduced home Basal to 25u daily (~25-30% home dose) titrate meds as she starts to eat and based upon blood sugar.  Diabetes control has been consulted.   Recent Labs  Lab 05/02/24 1832 05/02/24 2102 05/03/24 0611 05/03/24 1003 05/03/24 1104  GLUCAP 220* 249* 154* 95 95    Anxiety Depression ADHD:  Continue home meds.   PAF: In NSR. Holding coumadin  as above.  INR therapeutic Recent Labs  Lab 05/01/24 2346 05/02/24 1524 05/03/24 0350  05/03/24 0915  INR 2.7* 2.9* 2.8* 2.7*    Hypothyroidism: Euthyroid,continue synthroid .     AKI: Creatinine slightly trending up, hydrate and monitor.  Recent Labs    07/07/23 1429 09/17/23 1135 05/01/24 2346 05/03/24 0350  BUN 17 22 28* 21  CREATININE 1.02* 1.03* 1.25* 1.54*  CO2 24 21* 22 27  K 3.8 4.0 3.8 3.8     Class II Obesity w/ Body mass index is 37.88 kg/m.  Suspect OSA: Will benefit with PCP follow-up, weight loss,healthy lifestyle and outpatient sleep eval   DVT prophylaxis:  Code Status:   Code Status: Full Code Family Communication: plan of care discussed with patient at bedside. Patient status is: Remains hospitalized because of severity of illness Level of care: Telemetry Cardiac   Dispo: The patient is from: Home            Anticipated disposition: TBD Objective: Vitals last 24 hrs: Vitals:   05/03/24 0411 05/03/24 0742 05/03/24 0743 05/03/24 1100  BP: (!) 119/44 (!) 113/46 (!) 113/46 (!) 126/52  Pulse: (!) 49 (!) 50 (!) 50 (!) 53  Resp: 15 11 14 13   Temp: 98.3 F (36.8 C) 98.2 F (36.8 C)  97.8 F (36.6 C)  TempSrc: Oral Oral  Oral  SpO2: 95% 97% 94% 94%  Weight:      Height:        Physical Examination: General exam: alert awake, oriented, older than stated age HEENT:Oral mucosa moist, Ear/Nose WNL grossly Respiratory system: Bilaterally clear BS,no use of accessory muscle Cardiovascular system: S1 & S2 +, No JVD. Gastrointestinal system: Abdomen soft,NT,ND, BS+ Nervous System: Alert, awake, moving all extremities,and following  commands. Extremities: extremities warm, leg edema neg Skin: No rashes,no icterus. MSK: Normal muscle bulk,tone, power   Medications reviewed:  Scheduled Meds:  aspirin  EC  81 mg Oral Daily   cholecalciferol   5,000 Units Oral Daily   citalopram   20 mg Oral QPM   clopidogrel   75 mg Oral Daily   donepezil   5 mg Oral QPM   fenofibrate   160 mg Oral Daily   gabapentin   100 mg Oral QHS   insulin  aspart  0-20  Units Subcutaneous TID WC   insulin  aspart  0-5 Units Subcutaneous QHS   insulin  glargine  25 Units Subcutaneous Daily   levothyroxine   50 mcg Oral Q0600   loratadine   10 mg Oral q1800   magnesium  oxide  400 mg Oral Daily   modafinil   100 mg Oral Daily   montelukast   10 mg Oral QHS   multivitamin with minerals  1 tablet Oral Daily   pantoprazole   80 mg Oral Daily   rOPINIRole   2 mg Oral QHS   rosuvastatin   40 mg Oral Daily   Continuous Infusions:  sodium chloride      Diet: Diet Order             Diet heart healthy/carb modified Room service appropriate? Yes; Fluid consistency: Thin  Diet effective now                   Data Reviewed: I have personally reviewed following labs and imaging studies ( see epic result tab) CBC: Recent Labs  Lab 05/01/24 2346 05/03/24 0350 05/03/24 0915  WBC 9.1 8.1 9.2  HGB 14.5 14.3 14.0  HCT 43.4 43.1 42.5  MCV 91.9 92.1 92.2  PLT 279 265 266   CMP: Recent Labs  Lab 05/01/24 2346 05/03/24 0350  NA 133* 137  K 3.8 3.8  CL 97* 102  CO2 22 27  GLUCOSE 380* 150*  BUN 28* 21  CREATININE 1.25* 1.54*  CALCIUM  10.2 9.7   GFR: Estimated Creatinine Clearance: 40.2 mL/min (A) (by C-G formula based on SCr of 1.54 mg/dL (H)). No results for input(s): AST, ALT, ALKPHOS, BILITOT, PROT, ALBUMIN in the last 168 hours. No results for input(s): LIPASE, AMYLASE in the last 168 hours. No results for input(s): AMMONIA in the last 168 hours. Coagulation Profile:  Recent Labs  Lab 05/01/24 2346 05/02/24 1524 05/03/24 0350 05/03/24 0915  INR 2.7* 2.9* 2.8* 2.7*   Unresulted Labs (From admission, onward)     Start     Ordered   05/04/24 0500  Protime-INR  Daily,   R     Question:  Specimen collection method  Answer:  Lab=Lab collect   05/03/24 0730   05/04/24 0500  Basic metabolic panel with GFR  Daily,   R     Question:  Specimen collection method  Answer:  Lab=Lab collect   05/03/24 0836   05/03/24 0350  Lipoprotein A  (LPA)  Once,   R        05/03/24 0350           Antimicrobials/Microbiology: Anti-infectives (From admission, onward)    None      No results found for: SDES, SPECREQUEST, CULT, REPTSTATUS  Procedures: Procedure(s) (LRB): LEFT HEART CATH AND CORONARY ANGIOGRAPHY (N/A)   Mennie LAMY, MD [facility] 05/03/2024, 11:58 AM

## 2024-05-03 NOTE — Progress Notes (Signed)
  Progress Note  Patient Name: Rachel Odonnell Date of Encounter: 05/03/2024 [facility] Cardiologist: Rachel Sierras, MD   Interval Summary   Patient resting comfortably in bed Reports a single episode of chest pain, shortness of breath with ambulation in her room yesterday Pain relieved with morphine , nitroglycerin   Discussed trending of INR and waiting until in safe range to pursue LHC  Vital Signs Vitals:   05/02/24 2222 05/03/24 0411 05/03/24 0742 05/03/24 0743  BP: (!) 119/51 (!) 119/44 (!) 113/46 (!) 113/46  Pulse:  (!) 49 (!) 50 (!) 50  Resp: 14 15 11 14   Temp:  98.3 F (36.8 C) 98.2 F (36.8 C)   TempSrc:  Oral Oral   SpO2:  95% 97% 94%  Weight:      Height:        Intake/Output Summary (Last 24 hours) at 05/03/2024 0857 Last data filed at 05/02/2024 1441 Gross per 24 hour  Intake 860.17 ml  Output --  Net 860.17 ml      05/02/2024    6:15 PM 05/01/2024   11:19 PM 03/23/2024    1:38 PM  Last 3 Weights  Weight (lbs) 220 lb 10.9 oz 217 lb 6 oz 217 lb 6.4 oz  Weight (kg) 100.1 kg 98.6 kg 98.612 kg     Telemetry/ECG  Sinus bradycardia, HR 50s - Personally Reviewed  Physical Exam  GEN: No acute distress.   Neck: No JVD Cardiac: bradycardic, +murmur.  Respiratory: Clear to auscultation bilaterally. GI: Soft, nontender, non-distended  MS: No edema  Assessment & Plan   NSTEMI CAD s/p DES to LAD 03/2023 Hyperlipidemia  Home meds: Plavix , coumadin  due to PAF Reported an episode of chest pain, DOE with ambulation around room Pain resolved with morphine , nitroglycerin   Continue ASA + Plavix   Continue Crestor  40 mg, fenofibrate  160 mg daily  Continue to monitor INR until in range for LHC Patient currently NPO, will add diet order if can confirm LHC will likely be tomorrow   Paroxysmal atrial fibrllation  Home meds: coumadin , deferred DOAC to price Remains in sinus Holding coumadin  for LHC, trending INR  Mild aortic stenosis  Aortic valve area 1.  65 cm Mean gradient 14 mmHg Continue to monitor with serial echos as outpatient   Per primary Diabetes  Mood disorders AKI OSA Hypothyroidism    For questions or updates, please contact [facility] Please consult [email or web address] for contact info under         Signed, Rachel Odonnell Rachel Donath, PA-C

## 2024-05-03 NOTE — TOC CM/SW Note (Signed)
 Transition of Care [facility]) - Inpatient Brief Assessment   Patient Details  Name: Rachel Odonnell MRN: [id number] Date of Birth: 1955-12-22  Transition of [facility]) CM/SW Contact:    Lauraine FORBES Saa, LCSWA Phone Number: 05/03/2024, 9:27 AM   Clinical Narrative:  9:27 AM Per chart review, patient resides at home with spouse. Patient has a PCP and insurance. Patient does not have SNF or HH history. Patient has DME (CPAP) history with [facility] and [facility]. Patient's preferred pharmacy's are [facility] [address] and [facility] [place]. No TOC needs identified at this time. TOC will continue to follow and be available to assist.  Transition of Care Asessment: Insurance and Status: Insurance coverage has been reviewed Patient has primary care physician: Yes Home environment has been reviewed: Private Residence Prior level of function:: N/A Prior/Current Home Services: No current home services Social Drivers of Health Review: SDOH reviewed no interventions necessary Readmission risk has been reviewed: Yes (Currently Yellow 15%) Transition of care needs: no transition of care needs at this time

## 2024-05-03 NOTE — Plan of Care (Signed)
  Problem: Education: Goal: Knowledge of General Education information will improve Description: Including pain rating scale, medication(s)/side effects and non-pharmacologic comfort measures Outcome: Progressing   Problem: Nutrition: Goal: Adequate nutrition will be maintained Outcome: Progressing   Problem: Coping: Goal: Level of anxiety will decrease Outcome: Progressing   Problem: Elimination: Goal: Will not experience complications related to bowel motility Outcome: Progressing Goal: Will not experience complications related to urinary retention Outcome: Progressing   Problem: Pain Managment: Goal: General experience of comfort will improve and/or be controlled Outcome: Progressing   Problem: Safety: Goal: Ability to remain free from injury will improve Outcome: Progressing

## 2024-05-04 ENCOUNTER — Ambulatory Visit: Admitting: Nurse Practitioner

## 2024-05-04 ENCOUNTER — Encounter (HOSPITAL_COMMUNITY)
Admission: EM | Disposition: A | Discharge status: Home Health | Payer: Self-pay | Source: Home / Self Care | Attending: Thoracic Surgery (Cardiothoracic Vascular Surgery)

## 2024-05-04 DIAGNOSIS — I214 Non-ST elevation (NSTEMI) myocardial infarction: Secondary | ICD-10-CM | POA: Diagnosis not present

## 2024-05-04 DIAGNOSIS — E782 Mixed hyperlipidemia: Secondary | ICD-10-CM | POA: Diagnosis not present

## 2024-05-04 DIAGNOSIS — I25110 Atherosclerotic heart disease of native coronary artery with unstable angina pectoris: Secondary | ICD-10-CM

## 2024-05-04 DIAGNOSIS — I249 Acute ischemic heart disease, unspecified: Secondary | ICD-10-CM | POA: Diagnosis not present

## 2024-05-04 DIAGNOSIS — I480 Paroxysmal atrial fibrillation: Secondary | ICD-10-CM | POA: Diagnosis not present

## 2024-05-04 HISTORY — PX: LEFT HEART CATH AND CORONARY ANGIOGRAPHY: CATH118249

## 2024-05-04 LAB — BASIC METABOLIC PANEL WITH GFR
Anion gap: 15 (ref 5–15)
BUN: 22 mg/dL (ref 8–23)
CO2: 24 mmol/L (ref 22–32)
Calcium: 9.7 mg/dL (ref 8.9–10.3)
Chloride: 99 mmol/L (ref 98–111)
Creatinine, Ser: 1.21 mg/dL — ABNORMAL HIGH (ref 0.44–1.00)
GFR, Estimated: 49 mL/min — ABNORMAL LOW (ref >60)
Glucose, Bld: 234 mg/dL — ABNORMAL HIGH (ref 70–99)
Potassium: 4 mmol/L (ref 3.5–5.1)
Sodium: 138 mmol/L (ref 135–145)

## 2024-05-04 LAB — TROPONIN I (HIGH SENSITIVITY): Troponin I (High Sensitivity): 415 ng/L (ref <18)

## 2024-05-04 LAB — PROTIME-INR
INR: 1.9 — ABNORMAL HIGH (ref 0.8–1.2)
Prothrombin Time: 23.2 s — ABNORMAL HIGH (ref 11.4–15.2)

## 2024-05-04 LAB — GLUCOSE, CAPILLARY
Glucose-Capillary: 214 mg/dL — ABNORMAL HIGH (ref 70–99)
Glucose-Capillary: 266 mg/dL — ABNORMAL HIGH (ref 70–99)
Glucose-Capillary: 197 mg/dL — ABNORMAL HIGH (ref 70–99)
Glucose-Capillary: 321 mg/dL — ABNORMAL HIGH (ref 70–99)
Glucose-Capillary: 218 mg/dL — ABNORMAL HIGH (ref 70–99)

## 2024-05-04 LAB — LIPOPROTEIN A (LPA): Lipoprotein (a): 207.3 nmol/L — ABNORMAL HIGH (ref <75.0)

## 2024-05-04 SURGERY — LEFT HEART CATH AND CORONARY ANGIOGRAPHY
Anesthesia: LOCAL

## 2024-05-04 MED ORDER — SODIUM CHLORIDE 0.9 % WEIGHT BASED INFUSION: 1.0000 mL/kg/h | INTRAVENOUS | Status: DC

## 2024-05-04 MED ORDER — HEPARIN SODIUM (PORCINE) 1000 UNIT/ML IJ SOLN
INTRAMUSCULAR | Status: DC | PRN
  Administered 2024-05-04: 5000 [IU] via INTRAVENOUS

## 2024-05-04 MED ORDER — VERAPAMIL HCL 2.5 MG/ML IV SOLN
INTRAVENOUS | Status: DC | PRN
  Administered 2024-05-04 (×2): 10 mL via INTRA_ARTERIAL

## 2024-05-04 MED ORDER — ASPIRIN 81 MG PO CHEW: 81.0000 mg | CHEWABLE_TABLET | ORAL | Status: AC

## 2024-05-04 MED ORDER — SODIUM CHLORIDE 0.9% FLUSH
3.0000 mL | INTRAVENOUS | Status: DC | PRN
  Administered 2024-05-04: 3 mL via INTRAVENOUS

## 2024-05-04 MED ORDER — VERAPAMIL HCL 2.5 MG/ML IV SOLN
INTRAVENOUS | Status: AC
  Filled 2024-05-04: qty 2

## 2024-05-04 MED ORDER — LIDOCAINE HCL (PF) 1 % IJ SOLN
INTRAMUSCULAR | Status: DC | PRN
  Administered 2024-05-04: 2 mL

## 2024-05-04 MED ORDER — HEPARIN (PORCINE) IN NACL 1000-0.9 UT/500ML-% IV SOLN
INTRAVENOUS | Status: DC | PRN
  Administered 2024-05-04 (×2): 500 mL

## 2024-05-04 MED ORDER — MIDAZOLAM HCL 2 MG/2ML IJ SOLN
INTRAMUSCULAR | Status: AC
  Filled 2024-05-04: qty 2

## 2024-05-04 MED ORDER — HEPARIN SODIUM (PORCINE) 1000 UNIT/ML IJ SOLN
INTRAMUSCULAR | Status: AC
  Filled 2024-05-04: qty 10

## 2024-05-04 MED ORDER — IOHEXOL 350 MG/ML SOLN
INTRAVENOUS | Status: DC | PRN
  Administered 2024-05-04: 35 mL via INTRA_ARTERIAL

## 2024-05-04 MED ORDER — FREE WATER
500.0000 mL | Freq: Once | Status: AC
  Administered 2024-05-04: 500 mL via ORAL

## 2024-05-04 MED ORDER — FENTANYL CITRATE (PF) 100 MCG/2ML IJ SOLN
INTRAMUSCULAR | Status: AC
  Filled 2024-05-04: qty 2

## 2024-05-04 MED ORDER — HEPARIN (PORCINE) 25000 UT/250ML-% IV SOLN
1450.0000 [IU]/h | INTRAVENOUS | Status: DC
  Administered 2024-05-04: 1200 [IU]/h via INTRAVENOUS
  Administered 2024-05-05: 1300 [IU]/h via INTRAVENOUS
  Filled 2024-05-04: qty 250

## 2024-05-04 MED ORDER — MIDAZOLAM HCL 2 MG/2ML IJ SOLN
INTRAMUSCULAR | Status: DC | PRN
  Administered 2024-05-04 (×2): 1 mg via INTRAVENOUS

## 2024-05-04 MED ORDER — SODIUM CHLORIDE 0.9 % WEIGHT BASED INFUSION
3.0000 mL/kg/h | INTRAVENOUS | Status: DC
  Administered 2024-05-04: 3 mL/kg/h via INTRAVENOUS

## 2024-05-04 MED ORDER — FENTANYL CITRATE (PF) 100 MCG/2ML IJ SOLN
INTRAMUSCULAR | Status: DC | PRN
  Administered 2024-05-04 (×2): 25 ug via INTRAVENOUS

## 2024-05-04 MED ORDER — HEPARIN (PORCINE) 25000 UT/250ML-% IV SOLN
1200.0000 [IU]/h | INTRAVENOUS | Status: DC
  Filled 2024-05-04: qty 250

## 2024-05-04 MED ORDER — SODIUM CHLORIDE 0.9 % IV SOLN: 250.0000 mL | INTRAVENOUS | Status: AC | PRN

## 2024-05-04 MED ORDER — SODIUM CHLORIDE 0.9% FLUSH
3.0000 mL | Freq: Two times a day (BID) | INTRAVENOUS | Status: DC
  Administered 2024-05-04 – 2024-05-05 (×4): 3 mL via INTRAVENOUS

## 2024-05-04 MED ORDER — LIDOCAINE HCL (PF) 1 % IJ SOLN
INTRAMUSCULAR | Status: AC
  Filled 2024-05-04: qty 30

## 2024-05-04 SURGICAL SUPPLY — 9 items
CATH INFINITI AMBI 5FR JK (CATHETERS) IMPLANT
CATH INFINITI JR4 5F (CATHETERS) IMPLANT
DEVICE RAD COMP TR BAND LRG (VASCULAR PRODUCTS) IMPLANT
GLIDESHEATH SLEND SS 6F .021 (SHEATH) IMPLANT
GUIDEWIRE INQWIRE 1.5J.035X260 (WIRE) IMPLANT
GUIDEWIRE TIGER .035X300 (WIRE) IMPLANT
KIT SINGLE USE MANIFOLD (KITS) IMPLANT
PACK CARDIAC CATHETERIZATION (CUSTOM PROCEDURE TRAY) ×1 IMPLANT
SET ATX-X65L (MISCELLANEOUS) IMPLANT

## 2024-05-04 NOTE — Progress Notes (Signed)
 PROGRESS NOTE Rachel Odonnell  FMW:[id number] DOB: 08/29/1955 DOA: 05/01/2024 PCP: Jeanette Comer BRAVO, PA-C  Brief Narrative/Hospital Course: Rachel Odonnell is a 68 y.o. female with PMH of  CAD s/p DES to LAD Aug 2024 complicated by diagonal occlusion treated with balloon angioplasty, PAF on coumadin , OSA on CPAP, HTN, HLD, T2DM, hypothyroidism, and obesity (BMI 37) who presented to the ED on 05/01/2024 with chest pain with increasing severity and frequency over a couple days with associated dyspnea. ECG revealed nonspecific T wave abnormalities, borderline ST depressions, troponin mildly elevated Troponins were 47> 33>34.Cardiology recommended admission to [facility] under the hospitalist service for cardiac catheterization.  Subjective: Seen and examined Overnight blood pressure 100-1 20s, afebrile, Labs shows improved creatinine 1.2, INR 1.9.  Having some chest pain again today, she felt better with IV fentanyl   Patient and husband saying may be abel to do cath today Patient had chest pain last night given morphine  and nitroglycerin   Assessment and plan:  NSTEMI CAD HLD: Cardiology following for cardiac catheter once inr better.Lipid panel stable, troponin flat Continue aspirin  81, Plavix  75, Crestor  40,morphine  IV prn, NTG prn Echo 9/16 showed EF 70 to 75% no RWMA, RV SF normal mitral valve aortic valve stable Cardiology input appreciated plan for cath once INR<2 likely today or tomorrow> cardiology following  T2DM insulin -dependent with uncontrolled hyperglycemia: Borderline controlled fluctuating continue to hold metformin , cont SSI, cont Basal to 25u daily (~25-30% home dose) titrate meds as she starts to eat and based upon blood sugar. Recent Labs  Lab 05/03/24 1104 05/03/24 1624 05/03/24 2102 05/04/24 0555 05/04/24 0827  GLUCAP 95 198* 173* 214* 266*    Anxiety Depression ADHD:  Mood stable, continue home meds-donepezil , Celexa , modafinil , Requip .   PAF: In NSR. Holding  coumadin  as above.  INR therapeutic-and is being held Recent Labs  Lab 05/01/24 2346 05/02/24 1524 05/03/24 0350 05/03/24 0915 05/04/24 0847  INR 2.7* 2.9* 2.8* 2.7* 1.9*    Hypothyroidism: Euthyroid,continue synthroid .     AKI: Creatinine improved with IV fluid hydration,monitor.  Recent Labs    07/07/23 1429 09/17/23 1135 05/01/24 2346 05/03/24 0350 05/04/24 0328  BUN 17 22 28* 21 22  CREATININE 1.02* 1.03* 1.25* 1.54* 1.21*  CO2 24 21* 22 27 24   K 3.8 4.0 3.8 3.8 4.0     Class II Obesity w/ Body mass index is 37.2 kg/m.  Suspect OSA: Will benefit with PCP follow-up, weight loss,healthy lifestyle and outpatient sleep eval   DVT prophylaxis:  Code Status:   Code Status: Full Code Family Communication: plan of care discussed with patient at bedside. Patient status is: Remains hospitalized because of severity of illness Level of care: Telemetry Cardiac   Dispo: The patient is from: Home            Anticipated disposition: TBD Objective: Vitals last 24 hrs: Vitals:   05/04/24 0300 05/04/24 0350 05/04/24 0721 05/04/24 1013  BP: (!) 109/53 125/85 (!) 101/90   Pulse: (!) 52 (!) 59 64   Resp: 16     Temp: 98 F (36.7 C)  97.7 F (36.5 C)   TempSrc: Oral  Oral   SpO2: 90% 96% 92%   Weight:    98.3 kg  Height:        Physical Examination: General exam: alert awake, oriented, obese HEENT:Oral mucosa moist, Ear/Nose WNL grossly Respiratory system: Bilaterally clear BS,no use of accessory muscle Cardiovascular system: S1 & S2 +, No JVD. Gastrointestinal system: Abdomen soft,NT,ND, BS+ Nervous System: Alert,  awake, moving all extremities,and following commands. Extremities: extremities warm, leg edema neg Skin: No rashes,no icterus. MSK: Normal muscle bulk,tone, power   Medications reviewed:  Scheduled Meds:  aspirin  EC  81 mg Oral Daily   cholecalciferol   5,000 Units Oral Daily   citalopram   20 mg Oral QPM   clopidogrel   75 mg Oral Daily   donepezil   5 mg  Oral QPM   fenofibrate   160 mg Oral Daily   gabapentin   100 mg Oral QHS   insulin  aspart  0-20 Units Subcutaneous TID WC   insulin  aspart  0-5 Units Subcutaneous QHS   insulin  glargine  25 Units Subcutaneous Daily   levothyroxine   50 mcg Oral Q0600   loratadine   10 mg Oral q1800   magnesium  oxide  400 mg Oral Daily   modafinil   100 mg Oral Daily   montelukast   10 mg Oral QHS   multivitamin with minerals  1 tablet Oral Daily   pantoprazole   80 mg Oral Daily   rOPINIRole   2 mg Oral QHS   rosuvastatin   40 mg Oral Daily   Continuous Infusions:  sodium chloride      Followed by   sodium chloride      heparin  1,200 Units/hr (05/04/24 1018)   Diet: Diet Order             Diet NPO time specified Except for: Sips with Meds  Diet effective now                   Data Reviewed: I have personally reviewed following labs and imaging studies ( see epic result tab) CBC: Recent Labs  Lab 05/01/24 2346 05/03/24 0350 05/03/24 0915  WBC 9.1 8.1 9.2  HGB 14.5 14.3 14.0  HCT 43.4 43.1 42.5  MCV 91.9 92.1 92.2  PLT 279 265 266   CMP: Recent Labs  Lab 05/01/24 2346 05/03/24 0350 05/04/24 0328  NA 133* 137 138  K 3.8 3.8 4.0  CL 97* 102 99  CO2 22 27 24   GLUCOSE 380* 150* 234*  BUN 28* 21 22  CREATININE 1.25* 1.54* 1.21*  CALCIUM  10.2 9.7 9.7   GFR: Estimated Creatinine Clearance: 50.6 mL/min (A) (by C-G formula based on SCr of 1.21 mg/dL (H)). No results for input(s): AST, ALT, ALKPHOS, BILITOT, PROT, ALBUMIN in the last 168 hours. No results for input(s): LIPASE, AMYLASE in the last 168 hours. No results for input(s): AMMONIA in the last 168 hours. Coagulation Profile:  Recent Labs  Lab 05/01/24 2346 05/02/24 1524 05/03/24 0350 05/03/24 0915 05/04/24 0847  INR 2.7* 2.9* 2.8* 2.7* 1.9*   Unresulted Labs (From admission, onward)     Start     Ordered   05/05/24 0500  Heparin  level (unfractionated)  Daily,   R     Question:  Specimen collection method   Answer:  Lab=Lab collect   05/04/24 1002   05/05/24 0500  CBC  Daily,   R     Question:  Specimen collection method  Answer:  Lab=Lab collect   05/04/24 1002   05/04/24 1900  Heparin  level (unfractionated)  Once-Timed,   TIMED       Question:  Specimen collection method  Answer:  Lab=Lab collect   05/04/24 1002   05/04/24 0500  Protime-INR  Daily,   R     Question:  Specimen collection method  Answer:  Lab=Lab collect   05/03/24 0730   05/04/24 0500  Basic metabolic panel with GFR  Daily,  R     Question:  Specimen collection method  Answer:  Lab=Lab collect   05/03/24 0836           Antimicrobials/Microbiology: Anti-infectives (From admission, onward)    None      No results found for: SDES, SPECREQUEST, CULT, REPTSTATUS  Procedures: Procedure(s) (LRB): LEFT HEART CATH AND CORONARY ANGIOGRAPHY (N/A)   Mennie LAMY, MD [facility] 05/04/2024, 11:29 AM

## 2024-05-04 NOTE — Inpatient Diabetes Management (Signed)
 Inpatient Diabetes Program Recommendations  AACE/ADA: New Consensus Statement on Inpatient Glycemic Control (2015)  Target Ranges:  Prepandial:   less than 140 mg/dL      Peak postprandial:   less than 180 mg/dL (1-2 hours)      Critically ill patients:  140 - 180 mg/dL   Lab Results  Component Value Date   GLUCAP 266 (H) 05/04/2024   HGBA1C 10.6 (A) 07/14/2023    Review of Glycemic Control  Latest Reference Range & Units 05/03/24 06:11 05/03/24 10:03 05/03/24 11:04 05/03/24 16:24 05/03/24 21:02 05/04/24 05:55 05/04/24 08:27  Glucose-Capillary 70 - 99 mg/dL 845 (H) 95 95 801 (H) 826 (H) 214 (H) 266 (H)   Diabetes history: DM 2 Outpatient Diabetes medications: Jardiance  25 mg Daily, Tresiba  110 units qhs, Humalog  16-22 units tid before meals, metformin  500 mg qevening, Ozempic  0.5 mg weekly Current orders for Inpatient glycemic control:  Lantus  25 units Daily Novolog  0-20 units tid + hs  Inpatient Diabetes Program Recommendations:    Note: glucose trends increase after PO intake.  -   start Novolog  2 units tid meal coverage if eating >50% of meals  Thanks,  Clotilda Bull RN, MSN, BC-ADM Inpatient Diabetes Coordinator Team Pager [phone] (8a-5p)

## 2024-05-04 NOTE — Progress Notes (Signed)
 PHARMACY - ANTICOAGULATION CONSULT NOTE  Pharmacy Consult for Heparin  Indication: chest pain/ACS and atrial fibrillation (PTA Warfarin on hold)  Allergies  Allergen Reactions   Azithromycin Hives   Codeine Nausea Only   Atomoxetine Nausea And Vomiting   Atorvastatin Other (See Comments)    Restless legs   Lisinopril Nausea And Vomiting    Patient Measurements: Height: 5' 4 (162.6 cm) Weight: 100.1 kg (220 lb 10.9 oz) IBW/kg (Calculated) : 54.7 HEPARIN  DW (KG): 77.9  Vital Signs: Temp: 97.7 F (36.5 C) (09/18 0721) Temp Source: Oral (09/18 0721) BP: 101/90 (09/18 0721) Pulse Rate: 64 (09/18 0721)  Labs: Recent Labs    05/01/24 2346 05/02/24 0200 05/02/24 1524 05/03/24 0350 05/03/24 0915 05/03/24 1339 05/03/24 1620 05/04/24 0328 05/04/24 0847  HGB 14.5  --   --  14.3 14.0  --   --   --   --   HCT 43.4  --   --  43.1 42.5  --   --   --   --   PLT 279  --   --  265 266  --   --   --   --   LABPROT 29.8*  --  31.9* 31.1* 30.3*  --   --   --  23.2*  INR 2.7*  --  2.9* 2.8* 2.7*  --   --   --  1.9*  CREATININE 1.25*  --   --  1.54*  --   --   --  1.21*  --   TROPONINIHS 33*   < > 34*  --   --  34* 44*  --   --    < > = values in this interval not displayed.    Estimated Creatinine Clearance: 51.2 mL/min (A) (by C-G formula based on SCr of 1.21 mg/dL (H)).   Medical History: Past Medical History:  Diagnosis Date   ADD (attention deficit disorder)    Diabetes mellitus without complication (HCC)    Hypertension     Medications:  Scheduled:   aspirin  EC  81 mg Oral Daily   cholecalciferol   5,000 Units Oral Daily   citalopram   20 mg Oral QPM   clopidogrel   75 mg Oral Daily   donepezil   5 mg Oral QPM   fenofibrate   160 mg Oral Daily   gabapentin   100 mg Oral QHS   insulin  aspart  0-20 Units Subcutaneous TID WC   insulin  aspart  0-5 Units Subcutaneous QHS   insulin  glargine  25 Units Subcutaneous Daily   levothyroxine   50 mcg Oral Q0600   loratadine   10 mg  Oral q1800   magnesium  oxide  400 mg Oral Daily   modafinil   100 mg Oral Daily   montelukast   10 mg Oral QHS   multivitamin with minerals  1 tablet Oral Daily   pantoprazole   80 mg Oral Daily   rOPINIRole   2 mg Oral QHS   rosuvastatin   40 mg Oral Daily   Infusions:   Assessment: 68 yo F on warfarin PTA for hx afib presented with chest pain, NSTEMI.  Warfarin held (last dose 9/15) with plans for cardiac cath.  Pharmacy consulted to start heparin  when INR <2.  05/04/2024 INR 1.9  Goal of Therapy:  Heparin  level 0.3-0.7 units/ml Monitor platelets by anticoagulation protocol: Yes   Plan:  Start heparin  at 1200 units/hr No bolus with elevated INR Heparin  level in 8 hours Heparin  level and CBC daily while on heparin  Follow-up plans for cardiac cath.  [employer], Pharm.D., BCPS Clinical Pharmacist Clinical phone for 05/04/2024 from 7:30-3:00 is [phone].  **Pharmacist phone directory can be found on [email or web address] listed under [facility].  05/04/2024 9:59 AM

## 2024-05-04 NOTE — Progress Notes (Signed)
 PHARMACY - ANTICOAGULATION CONSULT NOTE  Pharmacy Consult for Heparin  Indication: chest pain/ACS and atrial fibrillation (PTA Warfarin on hold)  Allergies  Allergen Reactions   Azithromycin Hives   Codeine Nausea Only   Atomoxetine Nausea And Vomiting   Atorvastatin Other (See Comments)    Restless legs   Lisinopril Nausea And Vomiting    Patient Measurements: Height: 5' 4 (162.6 cm) Weight: 98.3 kg (216 lb 11.4 oz) IBW/kg (Calculated) : 54.7 HEPARIN  DW (KG): 77.9  Vital Signs: Temp: 97.7 F (36.5 C) (09/18 1539) Temp Source: Oral (09/18 1539) BP: 103/71 (09/18 1700) Pulse Rate: 56 (09/18 1700)  Labs: Recent Labs    05/01/24 2346 05/02/24 0200 05/03/24 0350 05/03/24 0915 05/03/24 1339 05/03/24 1620 05/04/24 0328 05/04/24 0847 05/04/24 1448  HGB 14.5  --  14.3 14.0  --   --   --   --   --   HCT 43.4  --  43.1 42.5  --   --   --   --   --   PLT 279  --  265 266  --   --   --   --   --   LABPROT 29.8*   < > 31.1* 30.3*  --   --   --  23.2*  --   INR 2.7*   < > 2.8* 2.7*  --   --   --  1.9*  --   CREATININE 1.25*  --  1.54*  --   --   --  1.21*  --   --   TROPONINIHS 33*   < >  --   --  34* 44*  --   --  415*   < > = values in this interval not displayed.    Estimated Creatinine Clearance: 50.6 mL/min (A) (by C-G formula based on SCr of 1.21 mg/dL (H)).   Medical History: Past Medical History:  Diagnosis Date   ADD (attention deficit disorder)    Diabetes mellitus without complication (HCC)    Hypertension     Medications:  Scheduled:   aspirin  EC  81 mg Oral Daily   cholecalciferol   5,000 Units Oral Daily   citalopram   20 mg Oral QPM   donepezil   5 mg Oral QPM   fenofibrate   160 mg Oral Daily   gabapentin   100 mg Oral QHS   insulin  aspart  0-20 Units Subcutaneous TID WC   insulin  aspart  0-5 Units Subcutaneous QHS   insulin  glargine  25 Units Subcutaneous Daily   levothyroxine   50 mcg Oral Q0600   loratadine   10 mg Oral q1800   magnesium  oxide   400 mg Oral Daily   modafinil   100 mg Oral Daily   montelukast   10 mg Oral QHS   multivitamin with minerals  1 tablet Oral Daily   pantoprazole   80 mg Oral Daily   rOPINIRole   2 mg Oral QHS   rosuvastatin   40 mg Oral Daily   sodium chloride  flush  3 mL Intravenous Q12H   Infusions:   sodium chloride      heparin  Stopped (05/04/24 1018)    Assessment: 68 yo F on warfarin PTA for hx afib presented with chest pain, NSTEMI.  Warfarin held (last dose 9/15) with plans for cardiac cath.   9/18 PM: consulted to restart heparin  2 hours after TR band removal. Will restart at previous rate.  Goal of Therapy:  Heparin  level 0.3-0.7 units/ml Monitor platelets by anticoagulation protocol: Yes   Plan:  Restart  heparin  at 1200 units/hr at 1900 No bolus with elevated INR Heparin  level in 8 hours Heparin  level and CBC daily while on heparin  Follow-up plans for cardiac cath.   Larraine Brazier, PharmD Clinical Pharmacist 05/04/2024  5:54 PM **Pharmacist phone directory can now be found on [email or web address] ([email or web address]).  Listed under [facility].

## 2024-05-04 NOTE — H&P (View-Only) (Signed)
 Cardiology Progress Note  Patient ID: Rachel Odonnell MRN: [id number] DOB: [birth year] Date of Encounter: 05/04/2024 Primary Cardiologist: Jayson Sierras, MD  Subjective   Chief Complaint: Chest pain  HPI: Chest pain reported overnight.  Nitroglycerin  does not help.  Fentanyl  seems to help with this.  Waiting on INR today.  She did eat breakfast.  ROS:  All other ROS reviewed and negative. Pertinent positives noted in the HPI.     Telemetry  Overnight telemetry shows sinus bradycardia 50s, which I personally reviewed.     Physical Exam   Vitals:   05/03/24 2300 05/04/24 0300 05/04/24 0350 05/04/24 0721  BP: (!) 118/41 (!) 109/53 125/85 (!) 101/90  Pulse: (!) 52 (!) 52 (!) 59 64  Resp: 15 16    Temp: 98.3 F (36.8 C) 98 F (36.7 C)  97.7 F (36.5 C)  TempSrc: Oral Oral  Oral  SpO2: 96% 90% 96% 92%  Weight:      Height:        Intake/Output Summary (Last 24 hours) at 05/04/2024 0841 Last data filed at 05/04/2024 0725 Gross per 24 hour  Intake 1671.22 ml  Output 400 ml  Net 1271.22 ml       05/02/2024    6:15 PM 05/01/2024   11:19 PM 03/23/2024    1:38 PM  Last 3 Weights  Weight (lbs) 220 lb 10.9 oz 217 lb 6 oz 217 lb 6.4 oz  Weight (kg) 100.1 kg 98.6 kg 98.612 kg    Body mass index is 37.88 kg/m.  General: Well nourished, well developed, in no acute distress Head: Atraumatic, normal size  Eyes: PEERLA, EOMI  Neck: Supple, no JVD Endocrine: No thryomegaly Cardiac: Normal S1, S2; RRR; no murmurs, rubs, or gallops Lungs: Clear to auscultation bilaterally, no wheezing, rhonchi or rales  Abd: Soft, nontender, no hepatomegaly  Ext: No edema, pulses 2+ Musculoskeletal: No deformities, BUE and BLE strength normal and equal Skin: Warm and dry, no rashes   Neuro: Alert and oriented to person, place, time, and situation, CNII-XII grossly intact, no focal deficits  Psych: Normal mood and affect   Cardiac Studies  TTE 05/02/2024  1. Left ventricular ejection fraction, by  estimation, is 70 to 75%. The  left ventricle has hyperdynamic function. The left ventricle has no  regional wall motion abnormalities. There is mild left ventricular  hypertrophy. Left ventricular diastolic  parameters were normal.   2. Right ventricular systolic function is normal. The right ventricular  size is normal. Tricuspid regurgitation signal is inadequate for assessing  PA pressure.   3. The mitral valve is normal in structure. Trivial mitral valve  regurgitation. No evidence of mitral stenosis.   4. The aortic valve is tricuspid. There is mild calcification of the  aortic valve. There is mild thickening of the aortic valve. Aortic valve  regurgitation is not visualized. Mild aortic valve stenosis. Aortic valve  area, by VTI measures 1.65 cm.  Aortic valve mean gradient measures 14.0 mmHg.   5. The inferior vena cava is normal in size with greater than 50%  respiratory variability, suggesting right atrial pressure of 3 mmHg.   LHC 04/16/2023 Single vessel obstructive CAD. Long segment of disease in the proximal to mid LAD. Angiographically this did not appear to be severe but flow was impaired on both CT FFR and directly measured RFR. Severe plaque on IVUS with Minimal lumen diameter of 2.5 mm squared.  Normal LV function. Mildly elevated LV filling pressures. PCWP 21/23 mean 16  mm Hg. LVEDP 21 mm Hg Mild pulmonary HTN PAP 40/14 mean 27 mm Hg Cardiac output 4.3 L/min with index 2.08. Successful PCI of the proximal to mid LAD with IVUS guidance and DES x 1. Abrupt occlusion of diagonal that was jailed by stent. Successfully treated with POBA through the stent struts.   Patient Profile  Rachel Odonnell is a 68 y.o. female with CAD status post PCI to mid LAD 03/2023, paroxysmal A-fib, moderate aortic stenosis, hypertension, hyperlipidemia admitted on 05/02/2024 with non-STEMI.  Assessment & Plan   # NSTEMI - Known history of CAD.  PCI to the mid LAD in August 2024.  Had a jailed  diagonal branch that was treated with angioplasty. - Admitted with chest pain and minimally elevated troponin.  EKG is nonischemic.  Unfortunately she is on Coumadin .  We are waiting for her INR to become less than 2.  Cath is tentatively planned for tomorrow. - I would like for her to be n.p.o. today.  I am waiting on her INR today.  If her INR is less than 2 we can proceed.  She did eat breakfast.  We will follow-up to see the results of her INR this morning. - In the interim continue aspirin  and Plavix .  No heparin  given elevated INR. - No beta-blocker due to bradycardia.  Echo is reassuring with no wall motion abnormality. - See discussion on lipid-lowering agents.  Informed Consent   Shared Decision Making/Informed Consent The risks [stroke (1 in 1000), death (1 in 1000), kidney failure [usually temporary] (1 in 500), bleeding (1 in 200), allergic reaction [possibly serious] (1 in 200)], benefits (diagnostic support and management of coronary artery disease) and alternatives of a cardiac catheterization were discussed in detail with Ms. Dominik and she is willing to proceed.     # HLD - LDL severely elevated.  Continue statin and fenofibrate .  Would recommend outpatient Repatha.  # Paroxysmal A-fib - On Coumadin  at home.  No recurrence of arrhythmia.  Waiting for INR to get less than 2 for cath.  # Sinus bradycardia - No symptoms.  Hold any AV nodal agents.     For questions or updates, please contact [facility] Please consult [email or web address] for contact info under         Signed, Darryle T. Barbaraann, MD, [facility]  [facility]  05/04/2024 8:41 AM

## 2024-05-04 NOTE — Progress Notes (Signed)
 TR band removed with no complications, transparent dressing place. RN will continue to monitor.

## 2024-05-04 NOTE — Interval H&P Note (Signed)
 History and Physical Interval Note:  05/04/2024 12:55 PM  Rachel Odonnell  has presented today for surgery, with the diagnosis of unstable angina.  The various methods of treatment have been discussed with the patient and family. After consideration of risks, benefits and other options for treatment, the patient has consented to  Procedure(s): LEFT HEART CATH AND CORONARY ANGIOGRAPHY (N/A) as a surgical intervention.  The patient's history has been reviewed, patient examined, no change in status, stable for surgery.  I have reviewed the patient's chart and labs.  Questions were answered to the patient's satisfaction.     Morganna Styles

## 2024-05-04 NOTE — Progress Notes (Deleted)
 Cardiology Office Note:  .   Date:  09/06/2023 ID:  Rachel Odonnell, DOB 1955/10/27, MRN [id number] PCP: Jeanette Comer Rachel Odonnell  [facility] Providers Cardiologist:  Jayson Sierras, MD Cardiology APP:  Miriam Norris, NP    History of Present Illness: Rachel Odonnell is a 68 y.o. female with a PMH of CAD, HLD, HTN, PAF, mild aortic valve stenosis, and OSA (compliant with CPAP), who presents today for 1 month follow-up.   Prior history of DES x 1 to prox to mid LAD, abrupt occlusion of diagonal that was jailed by stent. Successfully treated with POBA in August 2024.   Seen by [name], NP on 04/23/2023 after cardiac catheterization. Was overall feeling better but felt few episodes of fleeting chest pain. Was staying active. Was cleared to start cardiac rehab.   05/25/2023 - Today she presents for 1 month follow-up. She says her chest pain seems to be improved slightly, but not significantly. Describes chest pain as dull ache along left lateral side of chest wall, nonradiating. Denies any alleviating or aggravating factors, however says she takes NTG when this occurs, lays down, and pain is resolved. 5/10 on 0-10 pain scale. Says she is tired, not very active. Says she is not taking provigil . Says if she takes this medicine, she will not be able to get sleep and will up awake all night. Denies any shortness of breath, palpitations, syncope, presyncope, dizziness, orthopnea, PND, swelling or significant weight changes, acute bleeding, or claudication.  07/06/2023 - Presents for follow-up today with her husband.  She says that her and her husband have returned from a cruise not that long ago.  Has been recently getting over some sinusitis and bronchitis. She continues to notice CP, not as often compared to last office visit, says adjustment with Imdur  has helped her symptoms. Continues to note fatigue. Denies any shortness of breath, palpitations, syncope, presyncope, dizziness,  orthopnea, PND, swelling or significant weight changes, acute bleeding, or claudication.  She is requesting to stop cardiac rehab at this point and just exercise at [employer].  She also sees a nutritionist regularly.  09/06/2023 - Presents today for follow-up. Still having extreme fatigue. Denies any other changes to her health.  Denies any chest pain, shortness of breath, palpitations, syncope, presyncope, dizziness, orthopnea, PND, swelling or significant weight changes, acute bleeding, or claudication.  ROS: Negative. See HPI.   Studies Reviewed: .    Right/left heart cath 03/2023:    Prox RCA to Mid RCA lesion is 35% stenosed.   1st Mrg lesion is 60% stenosed.   Prox LAD to Mid LAD lesion is 60% stenosed.   A drug-eluting stent was successfully placed using a SYNERGY XD 3.0X38.   Post intervention, there is a 0% residual stenosis.   1st Diag lesion is 40% stenosed.   Balloon angioplasty was performed using a BALLN SAPPHIRE 2.0X12.   Post intervention, there is a 0% residual stenosis.   The left ventricular systolic function is normal.   LV end diastolic pressure is mildly elevated.   The left ventricular ejection fraction is 55-65% by visual estimate.   Hemodynamic findings consistent with mild pulmonary hypertension.   Recommend to resume Warfarin, at currently prescribed dose and frequency on 04/17/2023.   Recommend concurrent antiplatelet therapy of Aspirin  81 mg for 1 month and Clopidogrel  75mg  daily for 6 months .   Single vessel obstructive CAD. Long segment of disease in the proximal to mid LAD. Angiographically this  did not appear to be severe but flow was impaired on both CT FFR and directly measured RFR. Severe plaque on IVUS with Minimal lumen diameter of 2.5 mm squared.  Normal LV function. Mildly elevated LV filling pressures. PCWP 21/23 mean 16 mm Hg. LVEDP 21 mm Hg Mild pulmonary HTN PAP 40/14 mean 27 mm Hg Cardiac output 4.3 L/min with index 2.08. Successful PCI  of the proximal to mid LAD with IVUS guidance and DES x 1. Abrupt occlusion of diagonal that was jailed by stent. Successfully treated with POBA through the stent struts.    Plan: will monitor overnight on telemetry. DAPT with ASA for one month and Plavix  for 6 months. May resume Coumadin  tomorrow if no bleeding problems.   Echo 02/2023:   1. Left ventricular ejection fraction, by estimation, is 60 to 65%. Left  ventricular ejection fraction by 3D volume is 63 %. The left ventricle has  normal function. The left ventricle has no regional wall motion  abnormalities. Left ventricular diastolic   parameters are consistent with Grade I diastolic dysfunction (impaired  relaxation). The average left ventricular global longitudinal strain is  -20.2 %. The global longitudinal strain is normal.   2. Right ventricular systolic function is normal. The right ventricular  size is normal.   3. The mitral valve is grossly normal. Trivial mitral valve  regurgitation. No evidence of mitral stenosis.   4. The aortic valve is calcified. There is mild calcification of the  aortic valve. There is mild thickening of the aortic valve. Aortic valve  regurgitation is not visualized. Mild aortic valve stenosis. Aortic valve  area, by VTI measures 1.55 cm.  Aortic valve mean gradient measures 12.0 mmHg. Aortic valve Vmax measures  2.32 m/s.  CCTA 02/2023: IMPRESSION: 1. Coronary calcium  score of 153. This was 83rd percentile for age-, sex, and race-matched controls.   2. Total plaque volume 313 mm3 which is 64th percentile for age- and sex-matched controls (calcified plaque 59 mm3; non-calcified plaque 254 mm3). TPV is severe.   3. Normal coronary origin with right dominance.   4. There is moderate (50-69%) calcified plaque in the LAD and LCX. CAD-RADS 3.   5. Will send study for FFRct.  5. Aortic atherosclerosis.   IMPRESSION: 1. FFRct findings are consistent with significant stenosis in the mid  LAD.   2.  Recommend cardiac catheterization.  Cardiac monitor 04/2021:  12 day monitor Rare supraventricular ectopy in the form of isolated PACs, couplets, triplets. Fourteen episodes of SVT longest 8 beats Rare ventricular ectopy in the form of isolated PVCs, couplets. Four episodes of NSVT longest 15 beats. Reported symptoms correlated with sinus rhythm, PACs, PVCs, and afib Episodes of afib <1% burden, rates were controlled Single nocturnal pause 3.1 seconds     Patch Wear Time:  12 days and 7 hours (2022-08-22T11:20:36-0400 to 2022-09-03T18:39:38-0400)   Patient had a min HR of 45 bpm, max HR of 128 bpm, and avg HR of 58 bpm. Predominant underlying rhythm was Sinus Rhythm. First Degree AV Block was present. 4 Ventricular Tachycardia runs occurred, the run with the fastest interval lasting 15 beats with a  max rate of 128 bpm (avg 119 bpm); the run with the fastest interval was also the longest. 14 Supraventricular Tachycardia runs occurred, the run with the fastest interval lasting 8 beats with a max rate of 121 bpm (avg 99 bpm); the run with the fastest  interval was also the longest. Atrial Fibrillation occurred (<1% burden), ranging from 64-116  bpm (avg of 82 bpm), the longest lasting 4 mins 51 secs with an avg rate of 79 bpm. 1 Pause occurred lasting 3.1 secs (19 bpm). Atrial Fibrillation was  detected within +/- 45 seconds of symptomatic patient event(s). Isolated SVEs were rare (<1.0%), SVE Couplets were rare (<1.0%), and SVE Triplets were rare (<1.0%). Isolated VEs were rare (<1.0%), VE Couplets were rare (<1.0%), and no VE Triplets were  present. Risk Assessment/Calculations:    CHA2DS2-VASc Score = 5  This indicates a 7.2% annual risk of stroke. The patient's score is based upon: CHF History: 0 HTN History: 1 Diabetes History: 1 Stroke History: 0 Vascular Disease History: 1 Age Score: 1 Gender Score: 1   The ASCVD Risk score (Arnett DK, et al., 2019) failed to calculate  for the following reasons:   Risk score cannot be calculated because patient has a medical history suggesting prior/existing ASCVD      Physical Exam:   VS:  There were no vitals taken for this visit.   Wt Readings from Last 3 Encounters:  05/02/24 220 lb 10.9 oz (100.1 kg)  03/23/24 217 lb 6.4 oz (98.6 kg)  12/23/23 221 lb (100.2 kg)    GEN: Obese, 68 year old female in no acute distress NECK: No JVD; No carotid bruits CARDIAC: S1/S2, irregularly irregular rhythm, Grade 1 murmur, no rubs or gallops RESPIRATORY:  Clear to auscultation without rales, wheezing or rhonchi  EXTREMITIES:  No edema; No deformity   ASSESSMENT AND PLAN: .    CAD Denies any chest pain, see cardiac catheterization report noted above. Completed triple therapy per cardiac cath recommendations. Continue current medication regimen. Heart healthy diet and regular cardiovascular exercise encouraged. Care and ED precautions discussed. Will request most recent labs from PCP's office.   HLD, hypertriglyceridemia LDL 44 05/2023.  Continue current medication regimen. Heart healthy diet and regular cardiovascular exercise encouraged.  Follow-up with nutritionist as scheduled. Will request most recent labs from PCP's office.   HTN BP stable. Discussed to monitor BP at home at least 2 hours after medications and sitting for 5-10 minutes.  No medication changes at this time. Heart healthy diet and regular cardiovascular exercise encouraged.   A-fib Denies any tachycardia or palpitations. HR well controlled today. Followed closely at Coumadin  Clinic. Continue Coumadin , denies any bleeding issues.   Mild aortic valve stenosis Mild aortic valve stenosis noted on Echo 02/2023 with mean gradient measuring 12 mmHg. Denies any red flag signs/symptoms. Will continue to monitor and recommend updating Echo in 1 year, July 2025.  Care and ED precautions discussed.  Fatigue Etiology multifactorial.  Believe deconditioning is also  playing a part of this.  Compliant with her CPAP. Recommended to slowly increase her physical activity.  Follow-up with nutritionist as scheduled.  Recommended to discuss this further with PCP. She verbalized understanding.  Most recent labs unremarkable. Continue to follow with PCP.  7. Obesity Weight loss via diet and exercise encouraged. Discussed the impact being overweight would have on cardiovascular risk.  Dispo: Follow-up with Dr. Debera or APP in 6 months or sooner if anything changes.   Signed, [name], NP

## 2024-05-04 NOTE — Progress Notes (Signed)
   INR resulted at 1.9 this morning. Patient able to be scheduled for LHC this afternoon. Orders have been placed. Patient to remain NPO until procedure. Personally spoke with patient and her husband, they are aware of the plan. All questions were answered.   [name] DELENA Donath, PA-C 05/04/2024 10:21 AM

## 2024-05-04 NOTE — Plan of Care (Signed)
  Problem: Education: Goal: Understanding of cardiac disease, CV risk reduction, and recovery process will improve Outcome: Progressing   Problem: Activity: Goal: Ability to tolerate increased activity will improve Outcome: Progressing   Problem: Education: Goal: Knowledge of General Education information will improve Description: Including pain rating scale, medication(s)/side effects and non-pharmacologic comfort measures Outcome: Progressing   Problem: Health Behavior/Discharge Planning: Goal: Ability to manage health-related needs will improve Outcome: Progressing   Problem: Clinical Measurements: Goal: Ability to maintain clinical measurements within normal limits will improve Outcome: Progressing Goal: Will remain free from infection Outcome: Progressing Goal: Diagnostic test results will improve Outcome: Progressing   Problem: Activity: Goal: Risk for activity intolerance will decrease Outcome: Progressing   Problem: Nutrition: Goal: Adequate nutrition will be maintained Outcome: Progressing   Problem: Coping: Goal: Level of anxiety will decrease Outcome: Progressing   Problem: Safety: Goal: Ability to remain free from injury will improve Outcome: Progressing   Problem: Skin Integrity: Goal: Risk for impaired skin integrity will decrease Outcome: Progressing

## 2024-05-04 NOTE — Progress Notes (Signed)
 Cardiology Progress Note  Patient ID: Rachel Odonnell MRN: [id number] DOB: [birth year] Date of Encounter: 05/04/2024 Primary Cardiologist: Jayson Sierras, MD  Subjective   Chief Complaint: Chest pain  HPI: Chest pain reported overnight.  Nitroglycerin  does not help.  Fentanyl  seems to help with this.  Waiting on INR today.  She did eat breakfast.  ROS:  All other ROS reviewed and negative. Pertinent positives noted in the HPI.     Telemetry  Overnight telemetry shows sinus bradycardia 50s, which I personally reviewed.     Physical Exam   Vitals:   05/03/24 2300 05/04/24 0300 05/04/24 0350 05/04/24 0721  BP: (!) 118/41 (!) 109/53 125/85 (!) 101/90  Pulse: (!) 52 (!) 52 (!) 59 64  Resp: 15 16    Temp: 98.3 F (36.8 C) 98 F (36.7 C)  97.7 F (36.5 C)  TempSrc: Oral Oral  Oral  SpO2: 96% 90% 96% 92%  Weight:      Height:        Intake/Output Summary (Last 24 hours) at 05/04/2024 0841 Last data filed at 05/04/2024 0725 Gross per 24 hour  Intake 1671.22 ml  Output 400 ml  Net 1271.22 ml       05/02/2024    6:15 PM 05/01/2024   11:19 PM 03/23/2024    1:38 PM  Last 3 Weights  Weight (lbs) 220 lb 10.9 oz 217 lb 6 oz 217 lb 6.4 oz  Weight (kg) 100.1 kg 98.6 kg 98.612 kg    Body mass index is 37.88 kg/m.  General: Well nourished, well developed, in no acute distress Head: Atraumatic, normal size  Eyes: PEERLA, EOMI  Neck: Supple, no JVD Endocrine: No thryomegaly Cardiac: Normal S1, S2; RRR; no murmurs, rubs, or gallops Lungs: Clear to auscultation bilaterally, no wheezing, rhonchi or rales  Abd: Soft, nontender, no hepatomegaly  Ext: No edema, pulses 2+ Musculoskeletal: No deformities, BUE and BLE strength normal and equal Skin: Warm and dry, no rashes   Neuro: Alert and oriented to person, place, time, and situation, CNII-XII grossly intact, no focal deficits  Psych: Normal mood and affect   Cardiac Studies  TTE 05/02/2024  1. Left ventricular ejection fraction, by  estimation, is 70 to 75%. The  left ventricle has hyperdynamic function. The left ventricle has no  regional wall motion abnormalities. There is mild left ventricular  hypertrophy. Left ventricular diastolic  parameters were normal.   2. Right ventricular systolic function is normal. The right ventricular  size is normal. Tricuspid regurgitation signal is inadequate for assessing  PA pressure.   3. The mitral valve is normal in structure. Trivial mitral valve  regurgitation. No evidence of mitral stenosis.   4. The aortic valve is tricuspid. There is mild calcification of the  aortic valve. There is mild thickening of the aortic valve. Aortic valve  regurgitation is not visualized. Mild aortic valve stenosis. Aortic valve  area, by VTI measures 1.65 cm.  Aortic valve mean gradient measures 14.0 mmHg.   5. The inferior vena cava is normal in size with greater than 50%  respiratory variability, suggesting right atrial pressure of 3 mmHg.   LHC 04/16/2023 Single vessel obstructive CAD. Long segment of disease in the proximal to mid LAD. Angiographically this did not appear to be severe but flow was impaired on both CT FFR and directly measured RFR. Severe plaque on IVUS with Minimal lumen diameter of 2.5 mm squared.  Normal LV function. Mildly elevated LV filling pressures. PCWP 21/23 mean 16  mm Hg. LVEDP 21 mm Hg Mild pulmonary HTN PAP 40/14 mean 27 mm Hg Cardiac output 4.3 L/min with index 2.08. Successful PCI of the proximal to mid LAD with IVUS guidance and DES x 1. Abrupt occlusion of diagonal that was jailed by stent. Successfully treated with POBA through the stent struts.   Patient Profile  Rachel Odonnell is a 68 y.o. female with CAD status post PCI to mid LAD 03/2023, paroxysmal A-fib, moderate aortic stenosis, hypertension, hyperlipidemia admitted on 05/02/2024 with non-STEMI.  Assessment & Plan   # NSTEMI - Known history of CAD.  PCI to the mid LAD in August 2024.  Had a jailed  diagonal branch that was treated with angioplasty. - Admitted with chest pain and minimally elevated troponin.  EKG is nonischemic.  Unfortunately she is on Coumadin .  We are waiting for her INR to become less than 2.  Cath is tentatively planned for tomorrow. - I would like for her to be n.p.o. today.  I am waiting on her INR today.  If her INR is less than 2 we can proceed.  She did eat breakfast.  We will follow-up to see the results of her INR this morning. - In the interim continue aspirin  and Plavix .  No heparin  given elevated INR. - No beta-blocker due to bradycardia.  Echo is reassuring with no wall motion abnormality. - See discussion on lipid-lowering agents.  Informed Consent   Shared Decision Making/Informed Consent The risks [stroke (1 in 1000), death (1 in 1000), kidney failure [usually temporary] (1 in 500), bleeding (1 in 200), allergic reaction [possibly serious] (1 in 200)], benefits (diagnostic support and management of coronary artery disease) and alternatives of a cardiac catheterization were discussed in detail with Rachel Odonnell and she is willing to proceed.     # HLD - LDL severely elevated.  Continue statin and fenofibrate .  Would recommend outpatient Repatha.  # Paroxysmal A-fib - On Coumadin  at home.  No recurrence of arrhythmia.  Waiting for INR to get less than 2 for cath.  # Sinus bradycardia - No symptoms.  Hold any AV nodal agents.     For questions or updates, please contact [facility] Please consult [email or web address] for contact info under         Signed, Darryle T. Barbaraann, MD, [facility]  [facility]  05/04/2024 8:41 AM

## 2024-05-05 ENCOUNTER — Inpatient Hospital Stay (HOSPITAL_COMMUNITY)

## 2024-05-05 ENCOUNTER — Ambulatory Visit: Payer: Self-pay | Admitting: Emergency Medicine

## 2024-05-05 ENCOUNTER — Other Ambulatory Visit (HOSPITAL_COMMUNITY)

## 2024-05-05 ENCOUNTER — Encounter (HOSPITAL_COMMUNITY): Payer: Self-pay | Admitting: Cardiovascular Disease

## 2024-05-05 DIAGNOSIS — E1159 Type 2 diabetes mellitus with other circulatory complications: Secondary | ICD-10-CM | POA: Diagnosis not present

## 2024-05-05 DIAGNOSIS — Z01810 Encounter for preprocedural cardiovascular examination: Secondary | ICD-10-CM | POA: Diagnosis not present

## 2024-05-05 DIAGNOSIS — T82855A Stenosis of coronary artery stent, initial encounter: Secondary | ICD-10-CM | POA: Diagnosis not present

## 2024-05-05 DIAGNOSIS — I10 Essential (primary) hypertension: Secondary | ICD-10-CM

## 2024-05-05 DIAGNOSIS — I480 Paroxysmal atrial fibrillation: Secondary | ICD-10-CM

## 2024-05-05 DIAGNOSIS — I214 Non-ST elevation (NSTEMI) myocardial infarction: Secondary | ICD-10-CM | POA: Diagnosis not present

## 2024-05-05 DIAGNOSIS — E785 Hyperlipidemia, unspecified: Secondary | ICD-10-CM

## 2024-05-05 DIAGNOSIS — I249 Acute ischemic heart disease, unspecified: Secondary | ICD-10-CM | POA: Diagnosis not present

## 2024-05-05 DIAGNOSIS — Z794 Long term (current) use of insulin: Secondary | ICD-10-CM

## 2024-05-05 DIAGNOSIS — Z720 Tobacco use: Secondary | ICD-10-CM

## 2024-05-05 DIAGNOSIS — I2510 Atherosclerotic heart disease of native coronary artery without angina pectoris: Secondary | ICD-10-CM | POA: Diagnosis not present

## 2024-05-05 DIAGNOSIS — E1142 Type 2 diabetes mellitus with diabetic polyneuropathy: Secondary | ICD-10-CM | POA: Diagnosis not present

## 2024-05-05 LAB — GLUCOSE, CAPILLARY
Glucose-Capillary: 199 mg/dL — ABNORMAL HIGH (ref 70–99)
Glucose-Capillary: 356 mg/dL — ABNORMAL HIGH (ref 70–99)
Glucose-Capillary: 219 mg/dL — ABNORMAL HIGH (ref 70–99)
Glucose-Capillary: 207 mg/dL — ABNORMAL HIGH (ref 70–99)

## 2024-05-05 LAB — CBC
HCT: 42.5 % (ref 36.0–46.0)
Hemoglobin: 14.2 g/dL (ref 12.0–15.0)
MCH: 30.6 pg (ref 26.0–34.0)
MCHC: 33.4 g/dL (ref 30.0–36.0)
MCV: 91.6 fL (ref 80.0–100.0)
Platelets: 249 K/uL (ref 150–400)
RBC: 4.64 MIL/uL (ref 3.87–5.11)
RDW: 13.2 % (ref 11.5–15.5)
WBC: 9.7 K/uL (ref 4.0–10.5)
nRBC: 0 % (ref 0.0–0.2)

## 2024-05-05 LAB — HEPARIN LEVEL (UNFRACTIONATED)
Heparin Unfractionated: 0.21 [IU]/mL — ABNORMAL LOW (ref 0.30–0.70)
Heparin Unfractionated: 0.24 [IU]/mL — ABNORMAL LOW (ref 0.30–0.70)
Heparin Unfractionated: 0.39 [IU]/mL (ref 0.30–0.70)

## 2024-05-05 LAB — PREPARE RBC (CROSSMATCH)

## 2024-05-05 LAB — BASIC METABOLIC PANEL WITH GFR
Anion gap: 9 (ref 5–15)
BUN: 23 mg/dL (ref 8–23)
CO2: 24 mmol/L (ref 22–32)
Calcium: 10.1 mg/dL (ref 8.9–10.3)
Chloride: 101 mmol/L (ref 98–111)
Creatinine, Ser: 1.03 mg/dL — ABNORMAL HIGH (ref 0.44–1.00)
GFR, Estimated: 59 mL/min — ABNORMAL LOW (ref >60)
Glucose, Bld: 269 mg/dL — ABNORMAL HIGH (ref 70–99)
Potassium: 4.4 mmol/L (ref 3.5–5.1)
Sodium: 134 mmol/L — ABNORMAL LOW (ref 135–145)

## 2024-05-05 LAB — HEMOGLOBIN A1C
Hgb A1c MFr Bld: 9.2 % — ABNORMAL HIGH (ref 4.8–5.6)
Mean Plasma Glucose: 217 mg/dL

## 2024-05-05 LAB — ABO/RH: ABO/RH(D): O POS

## 2024-05-05 LAB — TROPONIN I (HIGH SENSITIVITY)
Troponin I (High Sensitivity): 696 ng/L (ref <18)
Troponin I (High Sensitivity): 828 ng/L (ref <18)

## 2024-05-05 LAB — VAS US DOPPLER PRE CABG

## 2024-05-05 LAB — PROTIME-INR
INR: 1.5 — ABNORMAL HIGH (ref 0.8–1.2)
Prothrombin Time: 19.1 s — ABNORMAL HIGH (ref 11.4–15.2)

## 2024-05-05 MED ORDER — NITROGLYCERIN IN D5W 200-5 MCG/ML-% IV SOLN
2.0000 ug/min | INTRAVENOUS | Status: AC
  Administered 2024-05-06: 5 ug/kg/min via INTRAVENOUS
  Filled 2024-05-05: qty 250

## 2024-05-05 MED ORDER — CEFAZOLIN SODIUM-DEXTROSE 2-4 GM/100ML-% IV SOLN
2.0000 g | INTRAVENOUS | Status: DC
  Filled 2024-05-05: qty 100

## 2024-05-05 MED ORDER — ENSURE PRE-SURGERY PO LIQD
296.0000 mL | Freq: Once | ORAL | Status: DC
  Filled 2024-05-05: qty 296

## 2024-05-05 MED ORDER — MANNITOL 20 % IV SOLN
INTRAVENOUS | Status: DC
  Filled 2024-05-05: qty 13

## 2024-05-05 MED ORDER — TRANEXAMIC ACID (OHS) PUMP PRIME SOLUTION
2.0000 mg/kg | INTRAVENOUS | Status: DC
  Filled 2024-05-05: qty 1.97

## 2024-05-05 MED ORDER — CHLORHEXIDINE GLUCONATE CLOTH 2 % EX PADS
6.0000 | MEDICATED_PAD | Freq: Once | CUTANEOUS | Status: AC
  Administered 2024-05-05: 6 via TOPICAL

## 2024-05-05 MED ORDER — CHLORHEXIDINE GLUCONATE 0.12 % MT SOLN
15.0000 mL | Freq: Once | OROMUCOSAL | Status: AC
  Administered 2024-05-06: 15 mL via OROMUCOSAL
  Filled 2024-05-05: qty 15

## 2024-05-05 MED ORDER — BISACODYL 5 MG PO TBEC: 5.0000 mg | DELAYED_RELEASE_TABLET | Freq: Once | ORAL | Status: DC

## 2024-05-05 MED ORDER — NOREPINEPHRINE 4 MG/250ML-% IV SOLN
0.0000 ug/min | INTRAVENOUS | Status: DC
Start: 2024-05-06 — End: 2024-05-06
  Filled 2024-05-05: qty 250

## 2024-05-05 MED ORDER — TEMAZEPAM 15 MG PO CAPS
15.0000 mg | ORAL_CAPSULE | Freq: Once | ORAL | Status: AC | PRN
  Administered 2024-05-05: 15 mg via ORAL
  Filled 2024-05-05: qty 1

## 2024-05-05 MED ORDER — NITROGLYCERIN IN D5W 200-5 MCG/ML-% IV SOLN
0.0000 ug/min | INTRAVENOUS | Status: DC
  Administered 2024-05-05: 5 ug/min via INTRAVENOUS
  Filled 2024-05-05: qty 250

## 2024-05-05 MED ORDER — PLASMA-LYTE A IV SOLN
INTRAVENOUS | Status: DC
  Filled 2024-05-05: qty 2.5

## 2024-05-05 MED ORDER — INSULIN REGULAR(HUMAN) IN NACL 100-0.9 UT/100ML-% IV SOLN
INTRAVENOUS | Status: AC
  Administered 2024-05-06: 1 [IU]/h via INTRAVENOUS
  Filled 2024-05-05: qty 100

## 2024-05-05 MED ORDER — DEXMEDETOMIDINE HCL IN NACL 400 MCG/100ML IV SOLN
0.1000 ug/kg/h | INTRAVENOUS | Status: AC
  Administered 2024-05-06: .5 ug/kg/h via INTRAVENOUS
  Filled 2024-05-05: qty 100

## 2024-05-05 MED ORDER — TRANEXAMIC ACID 1000 MG/10ML IV SOLN
1.5000 mg/kg/h | INTRAVENOUS | Status: AC
  Administered 2024-05-06: 1.5 mg/kg/h via INTRAVENOUS
  Filled 2024-05-05: qty 25

## 2024-05-05 MED ORDER — METOPROLOL TARTRATE 12.5 MG HALF TABLET
12.5000 mg | ORAL_TABLET | Freq: Once | ORAL | Status: AC
  Administered 2024-05-06: 12.5 mg via ORAL
  Filled 2024-05-05: qty 1

## 2024-05-05 MED ORDER — EPINEPHRINE HCL 5 MG/250ML IV SOLN IN NS
0.0000 ug/min | INTRAVENOUS | Status: DC
  Filled 2024-05-05: qty 250

## 2024-05-05 MED ORDER — PHENYLEPHRINE HCL-NACL 20-0.9 MG/250ML-% IV SOLN
30.0000 ug/min | INTRAVENOUS | Status: AC
  Administered 2024-05-06: 50 ug/min via INTRAVENOUS
  Filled 2024-05-05: qty 250

## 2024-05-05 MED ORDER — ISOSORBIDE MONONITRATE ER 30 MG PO TB24
30.0000 mg | ORAL_TABLET | Freq: Every day | ORAL | Status: DC
  Administered 2024-05-05: 30 mg via ORAL
  Filled 2024-05-05: qty 1

## 2024-05-05 MED ORDER — MILRINONE LACTATE IN DEXTROSE 20-5 MG/100ML-% IV SOLN
0.3000 ug/kg/min | INTRAVENOUS | Status: DC
  Filled 2024-05-05: qty 100

## 2024-05-05 MED ORDER — POTASSIUM CHLORIDE 2 MEQ/ML IV SOLN
80.0000 meq | INTRAVENOUS | Status: DC
  Filled 2024-05-05: qty 40

## 2024-05-05 MED ORDER — HEPARIN 30,000 UNITS/1000 ML (OHS) CELLSAVER SOLUTION
Status: DC
  Filled 2024-05-05: qty 1000

## 2024-05-05 MED ORDER — VANCOMYCIN HCL 1.5 G IV SOLR
1500.0000 mg | INTRAVENOUS | Status: AC
  Administered 2024-05-06: 1000 mg via INTRAVENOUS
  Filled 2024-05-05: qty 30

## 2024-05-05 MED ORDER — TRANEXAMIC ACID (OHS) BOLUS VIA INFUSION
15.0000 mg/kg | INTRAVENOUS | Status: AC
  Administered 2024-05-06: 1474.5 mg via INTRAVENOUS
  Filled 2024-05-05: qty 1475

## 2024-05-05 MED ORDER — CEFAZOLIN SODIUM-DEXTROSE 2-4 GM/100ML-% IV SOLN
2.0000 g | INTRAVENOUS | Status: AC
  Administered 2024-05-06: 2 g via INTRAVENOUS
  Filled 2024-05-05: qty 100

## 2024-05-05 NOTE — Progress Notes (Signed)
 VASCULAR LAB    Pre CABG Dopplers have been performed.  See CV proc for preliminary results.   Rokhaya Quinn, RVT 05/05/2024, 5:41 PM

## 2024-05-05 NOTE — Progress Notes (Signed)
 Still having ongoing chest pain.  Seems to only respond to fentanyl .  We tried Imdur  with no improvement.  We will put her on a nitro drip.  EKG shows no acute changes just septal infarct which is largely unchanged.  No ST elevation which is reassuring.  We will place her on a nitroglycerin  drip to see if this helps.  Will follow-up troponins.  Cardiac surgery is ordered a P2y12 assays to determine how responsive she is to Plavix .  They are requesting a 5-day washout on Plavix  before surgery.  We may need to go sooner.  We will follow-up the results of this test as well.  Discussed case with family and the husband at the bedside.  We will see if we can optimize her medically.  Her chest pain seems to have progressed over the past 24 hours.  Signed, Darryle DASEN. Barbaraann, MD, [facility]  [facility]  [address] [place], [place] [address] [phone]  1:48 PM

## 2024-05-05 NOTE — Care Management Important Message (Signed)
 Important Message  Patient Details  Name: Rachel Odonnell MRN: [id number] Date of Birth: 1955-12-31   Important Message Given:  Yes - Medicare IM     Claretta Deed 05/05/2024, 11:44 AM

## 2024-05-05 NOTE — Plan of Care (Signed)
  Problem: Activity: Goal: Ability to tolerate increased activity will improve Outcome: Progressing   Problem: Cardiac: Goal: Ability to achieve and maintain adequate cardiovascular perfusion will improve Outcome: Progressing   Problem: Education: Goal: Knowledge of General Education information will improve Description: Including pain rating scale, medication(s)/side effects and non-pharmacologic comfort measures Outcome: Progressing   Problem: Pain Managment: Goal: General experience of comfort will improve and/or be controlled Outcome: Progressing

## 2024-05-05 NOTE — Progress Notes (Signed)
 PHARMACY - ANTICOAGULATION CONSULT NOTE  Pharmacy Consult for Heparin  Indication: chest pain/ACS and atrial fibrillation (PTA Warfarin on hold)  Allergies  Allergen Reactions   Azithromycin Hives   Codeine Nausea Only   Atomoxetine Nausea And Vomiting   Atorvastatin Other (See Comments)    Restless legs   Lisinopril Nausea And Vomiting    Patient Measurements: Height: 5' 4 (162.6 cm) Weight: 98.3 kg (216 lb 11.4 oz) IBW/kg (Calculated) : 54.7 HEPARIN  DW (KG): 77.9  Vital Signs: Temp: 98.6 F (37 C) (09/19 1959) Temp Source: Oral (09/19 1959) BP: 131/68 (09/19 1959) Pulse Rate: 60 (09/19 1959)  Labs: Recent Labs    05/03/24 0350 05/03/24 0915 05/03/24 1339 05/04/24 0328 05/04/24 0847 05/04/24 1448 05/05/24 0321 05/05/24 1208 05/05/24 1335 05/05/24 2010  HGB 14.3 14.0  --   --   --   --  14.2  --   --   --   HCT 43.1 42.5  --   --   --   --  42.5  --   --   --   PLT 265 266  --   --   --   --  249  --   --   --   LABPROT 31.1* 30.3*  --   --  23.2*  --  19.1*  --   --   --   INR 2.8* 2.7*  --   --  1.9*  --  1.5*  --   --   --   HEPARINUNFRC  --   --   --   --   --   --  0.21* 0.24*  --  0.39  CREATININE 1.54*  --   --  1.21*  --   --  1.03*  --   --   --   TROPONINIHS  --   --    < >  --   --  415*  --  696* 828*  --    < > = values in this interval not displayed.    Estimated Creatinine Clearance: 59.5 mL/min (A) (by C-G formula based on SCr of 1.03 mg/dL (H)).   Medical History: Past Medical History:  Diagnosis Date   ADD (attention deficit disorder)    Diabetes mellitus without complication (HCC)    Hypertension     Medications:  Scheduled:   aspirin  EC  81 mg Oral Daily   bisacodyl   5 mg Oral Once   [START ON 05/06/2024] chlorhexidine   15 mL Mouth/Throat Once   Chlorhexidine  Gluconate Cloth  6 each Topical Once   cholecalciferol   5,000 Units Oral Daily   citalopram   20 mg Oral QPM   donepezil   5 mg Oral QPM   [START ON 05/06/2024] epinephrine    0-10 mcg/min Intravenous To OR   feeding supplement  296 mL Oral Once   fenofibrate   160 mg Oral Daily   gabapentin   100 mg Oral QHS   [START ON 05/06/2024] heparin  sodium (porcine) 2,500 Units, papaverine 30 mg in electrolyte-A (PLASMALYTE-A PH 7.4) 500 mL irrigation   Irrigation To OR   insulin  aspart  0-20 Units Subcutaneous TID WC   insulin  aspart  0-5 Units Subcutaneous QHS   insulin  glargine  25 Units Subcutaneous Daily   [START ON 05/06/2024] insulin    Intravenous To OR   [START ON 05/06/2024] [facility] Blood Cardioplegia vial (lidocaine /magnesium /mannitol  0.26g-4g-6.4g)   Intracoronary To OR   levothyroxine   50 mcg Oral Q0600   loratadine   10 mg Oral q1800  magnesium  oxide  400 mg Oral Daily   [START ON 05/06/2024] metoprolol  tartrate  12.5 mg Oral Once   modafinil   100 mg Oral Daily   montelukast   10 mg Oral QHS   multivitamin with minerals  1 tablet Oral Daily   pantoprazole   80 mg Oral Daily   [START ON 05/06/2024] phenylephrine   30-200 mcg/min Intravenous To OR   [START ON 05/06/2024] potassium chloride   80 mEq Other To OR   rOPINIRole   2 mg Oral QHS   rosuvastatin   40 mg Oral Daily   sodium chloride  flush  3 mL Intravenous Q12H   [START ON 05/06/2024] tranexamic acid   15 mg/kg Intravenous To OR   [START ON 05/06/2024] tranexamic acid   2 mg/kg Intracatheter To OR   Infusions:   [START ON 05/06/2024]  ceFAZolin  (ANCEF ) IV     [START ON 05/06/2024]  ceFAZolin  (ANCEF ) IV     [START ON 05/06/2024] dexmedetomidine      [START ON 05/06/2024] heparin  30,000 units/NS 1000 mL solution for CELLSAVER     heparin  1,450 Units/hr (05/05/24 1728)   [START ON 05/06/2024] milrinone      nitroGLYCERIN  5 mcg/min (05/05/24 1728)   [START ON 05/06/2024] nitroGLYCERIN      [START ON 05/06/2024] norepinephrine      [START ON 05/06/2024] tranexamic acid  (CYKLOKAPRON ) 2,500 mg in sodium chloride  0.9 % 250 mL (10 mg/mL) infusion     [START ON 05/06/2024] vancomycin       Assessment: 68 yo F on warfarin PTA  for hx afib presented with chest pain, NSTEMI.  Warfarin held (last dose 9/15) with plans for cardiac cath.   Pt now s/p cath with single vessel disease with plans for CABG.  9/19 PM: Heparin  level therapeutic at 0.39 on 1450 units/hr.  Goal of Therapy:  Heparin  level 0.3-0.7 units/ml Monitor platelets by anticoagulation protocol: Yes   Plan:  Continue heparin  at 1450 units/hr Confirmatory Heparin  Level with AM labs Heparin  level and CBC daily while on heparin    Larraine Brazier, PharmD Clinical Pharmacist 05/05/2024  8:39 PM **Pharmacist phone directory can now be found on [email or web address] ([email or web address]).  Listed under [facility].

## 2024-05-05 NOTE — Heart Team MDD (Signed)
   Heart Team Multi-Disciplinary Discussion  Patient: Rachel Odonnell  DOB: 17-May-1956  MRN: [id number]   Date: 05/05/2024  10:53 AM    Attendees: Interventional Cardiology: Lurena Red, MD Newman Lawrence, MD Gordy Bergamo, MD Alm Clay, MD Deatrice Cage, MD  Cardiothoracic Surgery: Linnie Rayas, MD   Additional Attendees: Luwana Clunes, MD   Patient History: 68 y.o. female with medical history significant for type 2 diabetes mellitus, essential hypertension and ADD, who presented to the emergency room with acute onset of chest pain felt as sharp pain and graded 8/10 in severity with radiation to left axillary area and both jaws.  The patient has been having intermittent chest pain over the last few days.  Additionally c/o associated dyspnea, nausea without vomiting or diaphoresis with associated palpitations.  Admitted for NSTEMI, high sensitive troponin I was 33 and later 47.     Risk Factors: Hyperlipidemia Diabetes Mellitus Hypertension   CAD History: Angina one year ago, left heart cath with PCI to prox LAD with DES.    Review of Prior Angiography and PCI Procedures: Left heart cath and coronary angiography completed on 05/05/2024 images were reviewed and discussed in detail including: Severe one-vessel coronary artery disease due to in-stent restenosis in the proximal portion of the LAD stent that was placed last year.  Stable left circumflex disease. EF was normal by echo. Mild gradient across the aortic valve. Severe right radial artery spasm. The patient has very aggressive restenosis in the proximal LAD.      Discussion: After presentation, consideration of treatment options occurred including CABG versus drug-coated balloon angioplasty. After cath, the patient was notified of results and indicated that she would prefer surgery over PCI. Consensus from the team given shared decision making with patient was for referral to CVTS for CABG.      Recommendations: CABG     Ellouise Elnor Myron, RN  05/05/2024 10:53 AM

## 2024-05-05 NOTE — Progress Notes (Signed)
 Cardiology Progress Note  Patient ID: [name] MRN: [id number] DOB: 05-Nov-1955 Date of Encounter: 05/05/2024 Primary Cardiologist: Jayson Sierras, MD  Subjective   Chief Complaint: Chest pain  HPI: Left heart cath with severe in-stent restenosis of the proximal LAD stent.  Will need consideration for bypass surgery.  Still having chest discomfort.  Seems to be improved with pain medication instead of nitroglycerin .  ROS:  All other ROS reviewed and negative. Pertinent positives noted in the HPI.     Telemetry  Overnight telemetry shows sinus bradycardia 50s, which I personally reviewed.   Physical Exam   Vitals:   05/04/24 1935 05/04/24 2348 05/05/24 0332 05/05/24 0725  BP: (!) 128/56 (!) 111/50 (!) 123/50 (!) 122/46  Pulse: (!) 56 (!) 58 (!) 53 (!) 53  Resp: 15 17 18 17   Temp: 97.6 F (36.4 C) 98 F (36.7 C) 97.6 F (36.4 C) 98.1 F (36.7 C)  TempSrc: Oral Oral Oral Oral  SpO2: 98% 94% 92% 92%  Weight:      Height:        Intake/Output Summary (Last 24 hours) at 05/05/2024 1029 Last data filed at 05/05/2024 0610 Gross per 24 hour  Intake 860 ml  Output 1800 ml  Net -940 ml       05/04/2024   10:13 AM 05/02/2024    6:15 PM 05/01/2024   11:19 PM  Last 3 Weights  Weight (lbs) 216 lb 11.4 oz 220 lb 10.9 oz 217 lb 6 oz  Weight (kg) 98.3 kg 100.1 kg 98.6 kg    Body mass index is 37.2 kg/m.  General: Well nourished, well developed, in no acute distress Head: Atraumatic, normal size  Eyes: PEERLA, EOMI  Neck: Supple, no JVD Endocrine: No thryomegaly Cardiac: Normal S1, S2; RRR; no murmurs, rubs, or gallops Lungs: Clear to auscultation bilaterally, no wheezing, rhonchi or rales  Abd: Soft, nontender, no hepatomegaly  Ext: No edema, pulses 2+, 2+ radial pulse Musculoskeletal: No deformities, BUE and BLE strength normal and equal Skin: Warm and dry, no rashes   Neuro: Alert and oriented to person, place, time, and situation, CNII-XII grossly intact, no focal  deficits  Psych: Normal mood and affect   Cardiac Studies  LHC 05/04/2024   Prox RCA to Mid RCA lesion is 35% stenosed.   1st Mrg lesion is 60% stenosed.   Prox LAD lesion is 95% stenosed.   1st Diag lesion is 60% stenosed.   1.  Severe one-vessel coronary artery disease due to in-stent restenosis in the proximal portion of the LAD stent that was placed last year.  Stable left circumflex disease. 2.  Left ventricular angiography was not performed.  EF was normal by echo. 3.  Mild gradient across the aortic valve. 4.  Severe right radial artery spasm.  TTE 05/02/2024  1. Left ventricular ejection fraction, by estimation, is 70 to 75%. The  left ventricle has hyperdynamic function. The left ventricle has no  regional wall motion abnormalities. There is mild left ventricular  hypertrophy. Left ventricular diastolic  parameters were normal.   2. Right ventricular systolic function is normal. The right ventricular  size is normal. Tricuspid regurgitation signal is inadequate for assessing  PA pressure.   3. The mitral valve is normal in structure. Trivial mitral valve  regurgitation. No evidence of mitral stenosis.   4. The aortic valve is tricuspid. There is mild calcification of the  aortic valve. There is mild thickening of the aortic valve. Aortic valve  regurgitation  is not visualized. Mild aortic valve stenosis. Aortic valve  area, by VTI measures 1.65 cm.  Aortic valve mean gradient measures 14.0 mmHg.   5. The inferior vena cava is normal in size with greater than 50%  respiratory variability, suggesting right atrial pressure of 3 mmHg.   Patient Profile  Rachel Odonnell is a 68 y.o. female with CAD status post PCI to the proximal LAD 03/2023, diabetes, paroxysmal atrial fibrillation, mild aortic stenosis, hypertension admitted on 05/02/2024 with non-STEMI.  Assessment & Plan   # Non-STEMI - Found to have severe in-stent restenosis of the proximal LAD stent. - Evaluated by  interventional cardiology who felt bypass to be the best option.  We are awaiting surgical consultation. - Continues to have on and off chest pain.  Will need Plavix  washout. - We will add Imdur  30 mg daily to see if we can control her pain.  Her pain is improved with oxycodone  and we can continue this. - Continue aspirin  and heparin  drip. - No beta-blocker given baseline bradycardia. - LDL 75.  Elevated LPA.  Should be evaluated for outpatient Repatha. - Echo is reassuring with no wall motion abnormality.  # Hyperlipidemia - LDL 75.  Continue current regimen.  LPA elevated.  Should consider outpatient Repatha.  # Paroxysmal A-fib - Coumadin  on hold.  Now on heparin  drip.  No recurrence.  # Sinus bradycardia - Continue to monitor.  # Diabetes - Repeat A1c.  Sliding scale insulin .  # Mild Aortic stenosis - can be followed. Does not need AVR.      For questions or updates, please contact [facility] Please consult [email or web address] for contact info under         Signed, Darryle T. Barbaraann, MD, [facility] [place]  [facility]  05/05/2024 10:29 AM

## 2024-05-05 NOTE — Inpatient Diabetes Management (Signed)
 Inpatient Diabetes Program Recommendations  AACE/ADA: New Consensus Statement on Inpatient Glycemic Control  Target Ranges:  Prepandial:   less than 140 mg/dL      Peak postprandial:   less than 180 mg/dL (1-2 hours)      Critically ill patients:  140 - 180 mg/dL    Latest Reference Range & Units 05/04/24 08:27 05/04/24 11:47 05/04/24 16:47 05/04/24 21:01 05/05/24 06:07  Glucose-Capillary 70 - 99 mg/dL 733 (H) 802 (H) 678 (H) 218 (H) 199 (H)   Review of Glycemic Control  Diabetes history: DM2 Outpatient Diabetes medications: Tresiba  110 units at bedtime, Humalog  16-22 units TID, Jardiance  25 mg daily, Ozempic  0.5 mg Qweek, Metfrmin 500 mg QPM Current orders for Inpatient glycemic control: Lantus  25 units daily, Novolog  0-20 units TID with meals, Novolog  0-5 units QHS  Inpatient Diabetes Program Recommendations:    Insulin : Please consider increasing Lantus  to 27 units daily.  Patient currently NPO. Once diet resumed, please consider ordering Novolog  4 units TID with meals for meal coverage if patient eats at least 50% of meals.  Thanks, Earnie Gainer, RN, MSN, CDCES Diabetes Coordinator Inpatient Diabetes Program [phone] (Team Pager from 8am to 5pm)

## 2024-05-05 NOTE — Progress Notes (Signed)
 Chest pain has improved on nitroglycerin  drip.  Currently 1-2/10.  Her EKG shows no acute ischemic changes.  Troponins are minimally elevated and rising but she appears comfortable.  Discussed her case with Dr. Shyrl.  We will plan to continue nitroglycerin  drip.  She will minimize movement.  We will likely proceed with CABG tomorrow.  She is currently NPO.  If her chest pain progresses overnight she would need to be considered for intra-aortic balloon pump.  I am hopeful that nitroglycerin  drip will get her through the night.  N.p.o. at midnight for CABG tomorrow.  Signed, Darryle DASEN. Barbaraann, MD, [facility]  [facility]  [address] [place], [place] [address] [phone]  5:20 PM

## 2024-05-05 NOTE — Progress Notes (Signed)
 PHARMACY - ANTICOAGULATION  Pharmacy Consult for Heparin  Indication: chest pain/ACS and atrial fibrillation Brief A/P: Heparin  level subtherapeutic Increase Heparin  rate  Allergies  Allergen Reactions   Azithromycin Hives   Codeine Nausea Only   Atomoxetine Nausea And Vomiting   Atorvastatin Other (See Comments)    Restless legs   Lisinopril Nausea And Vomiting    Patient Measurements: Height: 5' 4 (162.6 cm) Weight: 98.3 kg (216 lb 11.4 oz) IBW/kg (Calculated) : 54.7 HEPARIN  DW (KG): 77.9  Vital Signs: Temp: 97.6 F (36.4 C) (09/19 0332) Temp Source: Oral (09/19 0332) BP: 123/50 (09/19 0332) Pulse Rate: 53 (09/19 0332)  Labs: Recent Labs    05/03/24 0350 05/03/24 0915 05/03/24 1339 05/03/24 1620 05/04/24 0328 05/04/24 0847 05/04/24 1448 05/05/24 0321  HGB 14.3 14.0  --   --   --   --   --  14.2  HCT 43.1 42.5  --   --   --   --   --  42.5  PLT 265 266  --   --   --   --   --  249  LABPROT 31.1* 30.3*  --   --   --  23.2*  --  19.1*  INR 2.8* 2.7*  --   --   --  1.9*  --  1.5*  HEPARINUNFRC  --   --   --   --   --   --   --  0.21*  CREATININE 1.54*  --   --   --  1.21*  --   --  1.03*  TROPONINIHS  --   --  34* 44*  --   --  415*  --     Estimated Creatinine Clearance: 59.5 mL/min (A) (by C-G formula based on SCr of 1.03 mg/dL (H)).   Assessment: 68 y.o. female with h/o Afib, Coumadin  on hold, for heparin   Goal of Therapy:  Heparin  level 0.3-0.7 units/ml Monitor platelets by anticoagulation protocol: Yes   Plan:  Increase Heparin  1300 units/hr Check heparin  level in 8 hours.  Cathlyn Arrant, PharmD, BCPS

## 2024-05-05 NOTE — Progress Notes (Signed)
 PHARMACY - ANTICOAGULATION CONSULT NOTE  Pharmacy Consult for Heparin  Indication: chest pain/ACS and atrial fibrillation (PTA Warfarin on hold)  Allergies  Allergen Reactions   Azithromycin Hives   Codeine Nausea Only   Atomoxetine Nausea And Vomiting   Atorvastatin Other (See Comments)    Restless legs   Lisinopril Nausea And Vomiting    Patient Measurements: Height: 5' 4 (162.6 cm) Weight: 98.3 kg (216 lb 11.4 oz) IBW/kg (Calculated) : 54.7 HEPARIN  DW (KG): 77.9  Vital Signs: Temp: 98.1 F (36.7 C) (09/19 0725) Temp Source: Oral (09/19 0725) BP: 121/54 (09/19 1026) Pulse Rate: 53 (09/19 1026)  Labs: Recent Labs    05/03/24 0350 05/03/24 0915 05/03/24 1339 05/03/24 1620 05/04/24 0328 05/04/24 0847 05/04/24 1448 05/05/24 0321 05/05/24 1208  HGB 14.3 14.0  --   --   --   --   --  14.2  --   HCT 43.1 42.5  --   --   --   --   --  42.5  --   PLT 265 266  --   --   --   --   --  249  --   LABPROT 31.1* 30.3*  --   --   --  23.2*  --  19.1*  --   INR 2.8* 2.7*  --   --   --  1.9*  --  1.5*  --   HEPARINUNFRC  --   --   --   --   --   --   --  0.21* 0.24*  CREATININE 1.54*  --   --   --  1.21*  --   --  1.03*  --   TROPONINIHS  --   --    < > 44*  --   --  415*  --  696*   < > = values in this interval not displayed.    Estimated Creatinine Clearance: 59.5 mL/min (A) (by C-G formula based on SCr of 1.03 mg/dL (H)).   Medical History: Past Medical History:  Diagnosis Date   ADD (attention deficit disorder)    Diabetes mellitus without complication (HCC)    Hypertension     Medications:  Scheduled:   aspirin  EC  81 mg Oral Daily   cholecalciferol   5,000 Units Oral Daily   citalopram   20 mg Oral QPM   donepezil   5 mg Oral QPM   fenofibrate   160 mg Oral Daily   gabapentin   100 mg Oral QHS   insulin  aspart  0-20 Units Subcutaneous TID WC   insulin  aspart  0-5 Units Subcutaneous QHS   insulin  glargine  25 Units Subcutaneous Daily   levothyroxine   50 mcg  Oral Q0600   loratadine   10 mg Oral q1800   magnesium  oxide  400 mg Oral Daily   modafinil   100 mg Oral Daily   montelukast   10 mg Oral QHS   multivitamin with minerals  1 tablet Oral Daily   pantoprazole   80 mg Oral Daily   rOPINIRole   2 mg Oral QHS   rosuvastatin   40 mg Oral Daily   sodium chloride  flush  3 mL Intravenous Q12H   Infusions:   heparin  1,300 Units/hr (05/05/24 1353)   nitroGLYCERIN  5 mcg/min (05/05/24 1356)    Assessment: 68 yo F on warfarin PTA for hx afib presented with chest pain, NSTEMI.  Warfarin held (last dose 9/15) with plans for cardiac cath.   Pt now s/p cath with single vessel disease with plans  for CABG.  Heparin  level 0.24 on heparin  at 1300 units/hr.  Hgb stable 14s.  Goal of Therapy:  Heparin  level 0.3-0.7 units/ml Monitor platelets by anticoagulation protocol: Yes   Plan:  Increase heparin  to 1450 units/hr  Heparin  level in 6 hours Heparin  level and CBC daily while on heparin    [employer], Pharm.D., BCPS Clinical Pharmacist Clinical phone for 05/05/2024 from 7:30-3:00 is [phone].  **Pharmacist phone directory can be found on [email or web address] listed under [facility].  05/05/2024 2:00 PM

## 2024-05-05 NOTE — Progress Notes (Signed)
 PROGRESS NOTE Rachel Odonnell  FMW:[id number] DOB: 1956-01-14 DOA: 05/01/2024 PCP: Rachel Odonnell BRAVO, PA-C  Brief Narrative/Hospital Course: Rachel Odonnell is a 68 y.o. female with PMH of  CAD s/p DES to LAD Aug 2024 complicated by diagonal occlusion treated with balloon angioplasty, PAF on coumadin , OSA on CPAP, HTN, HLD, T2DM, hypothyroidism, and obesity (BMI 37) who presented to the ED on 05/01/2024 with chest pain with increasing severity and frequency over a couple days with associated dyspnea. ECG revealed nonspecific T wave abnormalities, borderline ST depressions, troponin mildly elevated Troponins were 47> 33>34.Cardiology recommended admission to [facility] under the hospitalist service for cardiac catheterization. Cath was on hold pending INR improvement.  2, subsequently 1.9 on 9/18 and  underwent LHC-showed aggressive restenosis in the proximal LAD management options including CABG versus drug-coated balloon angioplasty  Subjective: Seen and examined Husband at the bedside and was resting comfortably she had no chest pain Overnight afebrile BP stable Labs fairly stable this morning inr 1.5  Assessment and plan:  NSTEMI CAD HLD: underwent LHC-showed very aggressive restenosis in the proximal LAD management options including CABG versus drug-coated balloon angioplasty. Continue aspirin  81, Plavix  75, Crestor  40,morphine  IV prn, NTG prn Echo 9/16 showed EF 70 to 75% no RWMA, RV SF normal mitral valve aortic valve stable  T2DM insulin -dependent with uncontrolled hyperglycemia: Borderline controlled fluctuating continue to hold metformin , cont SSI, cont current long-acting insulin  and adjust as able to take p.o. Recent Labs  Lab 05/04/24 0827 05/04/24 1147 05/04/24 1647 05/04/24 2101 05/05/24 0607  GLUCAP 266* 197* 321* 218* 199*    Anxiety Depression ADHD:  Mood stable, continue home meds-donepezil , Celexa , modafinil , Requip .   PAF: In NSR. Holding coumadin  as above.  INR  therapeutic-and is being held Recent Labs  Lab 05/02/24 1524 05/03/24 0350 05/03/24 0915 05/04/24 0847 05/05/24 0321  INR 2.9* 2.8* 2.7* 1.9* 1.5*    Hypothyroidism: Euthyroid,continue synthroid .     AKI: Creatinine improved with IV fluid hydration,monitor.  Recent Labs    07/07/23 1429 09/17/23 1135 05/01/24 2346 05/03/24 0350 05/04/24 0328 05/05/24 0321  BUN 17 22 28* 21 22 23   CREATININE 1.02* 1.03* 1.25* 1.54* 1.21* 1.03*  CO2 24 21* 22 27 24 24   K 3.8 4.0 3.8 3.8 4.0 4.4     Class II Obesity w/ Body mass index is 37.2 kg/m.  Suspect OSA: Will benefit with PCP follow-up, weight loss,healthy lifestyle and outpatient sleep eval   DVT prophylaxis:  Code Status:   Code Status: Full Code Family Communication: plan of care discussed with patient at bedside. Patient status is: Remains hospitalized because of severity of illness Level of care: Telemetry Cardiac   Dispo: The patient is from: Home            Anticipated disposition: TBD Objective: Vitals last 24 hrs: Vitals:   05/04/24 1935 05/04/24 2348 05/05/24 0332 05/05/24 0725  BP: (!) 128/56 (!) 111/50 (!) 123/50 (!) 122/46  Pulse: (!) 56 (!) 58 (!) 53 (!) 53  Resp: 15 17 18 17   Temp: 97.6 F (36.4 C) 98 F (36.7 C) 97.6 F (36.4 C) 98.1 F (36.7 C)  TempSrc: Oral Oral Oral Oral  SpO2: 98% 94% 92% 92%  Weight:      Height:        Physical Examination: General exam: alert awake, oriented, obese HEENT:Oral mucosa moist, Ear/Nose WNL grossly Respiratory system: Bilaterally clear BS,no use of accessory muscle Cardiovascular system: S1 & S2 +, No JVD. Gastrointestinal system: Abdomen soft,NT,ND,  BS+ Nervous System: Alert, awake, moving all extremities,and following commands. Extremities: extremities warm, leg edema neg Skin: No rashes,no icterus. MSK: Normal muscle bulk,tone, power   Medications reviewed:  Scheduled Meds:  aspirin  EC  81 mg Oral Daily   cholecalciferol   5,000 Units Oral Daily    citalopram   20 mg Oral QPM   donepezil   5 mg Oral QPM   fenofibrate   160 mg Oral Daily   gabapentin   100 mg Oral QHS   insulin  aspart  0-20 Units Subcutaneous TID WC   insulin  aspart  0-5 Units Subcutaneous QHS   insulin  glargine  25 Units Subcutaneous Daily   levothyroxine   50 mcg Oral Q0600   loratadine   10 mg Oral q1800   magnesium  oxide  400 mg Oral Daily   modafinil   100 mg Oral Daily   montelukast   10 mg Oral QHS   multivitamin with minerals  1 tablet Oral Daily   pantoprazole   80 mg Oral Daily   rOPINIRole   2 mg Oral QHS   rosuvastatin   40 mg Oral Daily   sodium chloride  flush  3 mL Intravenous Q12H   Continuous Infusions:  sodium chloride      heparin  1,300 Units/hr (05/05/24 0609)   Diet: Diet Order             Diet Heart Room service appropriate? Yes; Fluid consistency: Thin  Diet effective now                   Data Reviewed: I have personally reviewed following labs and imaging studies ( see epic result tab) CBC: Recent Labs  Lab 05/01/24 2346 05/03/24 0350 05/03/24 0915 05/05/24 0321  WBC 9.1 8.1 9.2 9.7  HGB 14.5 14.3 14.0 14.2  HCT 43.4 43.1 42.5 42.5  MCV 91.9 92.1 92.2 91.6  PLT 279 265 266 249   CMP: Recent Labs  Lab 05/01/24 2346 05/03/24 0350 05/04/24 0328 05/05/24 0321  NA 133* 137 138 134*  K 3.8 3.8 4.0 4.4  CL 97* 102 99 101  CO2 22 27 24 24   GLUCOSE 380* 150* 234* 269*  BUN 28* 21 22 23   CREATININE 1.25* 1.54* 1.21* 1.03*  CALCIUM  10.2 9.7 9.7 10.1   GFR: Estimated Creatinine Clearance: 59.5 mL/min (A) (by C-G formula based on SCr of 1.03 mg/dL (H)). No results for input(s): AST, ALT, ALKPHOS, BILITOT, PROT, ALBUMIN in the last 168 hours. No results for input(s): LIPASE, AMYLASE in the last 168 hours. No results for input(s): AMMONIA in the last 168 hours. Coagulation Profile:  Recent Labs  Lab 05/02/24 1524 05/03/24 0350 05/03/24 0915 05/04/24 0847 05/05/24 0321  INR 2.9* 2.8* 2.7* 1.9* 1.5*    Unresulted Labs (From admission, onward)     Start     Ordered   05/06/24 0500  Heparin  level (unfractionated)  Daily,   R     Question:  Specimen collection method  Answer:  Lab=Lab collect   05/04/24 1756   05/05/24 1200  Heparin  level (unfractionated)  Once-Timed,   TIMED       Question:  Specimen collection method  Answer:  Lab=Lab collect   05/05/24 0450   05/05/24 1047  Platelet inhibition p2y12 (not at [facility])  ONCE - URGENT,   URGENT       Question:  Specimen collection method  Answer:  Lab=Lab collect   05/05/24 1047   05/05/24 0500  CBC  Daily,   R     Question:  Specimen collection method  Answer:  Lab=Lab collect   05/04/24 1002   05/05/24 0500  Hemoglobin A1c  Tomorrow morning,   R       Question:  Specimen collection method  Answer:  Lab=Lab collect   05/04/24 1446   05/04/24 0500  Basic metabolic panel with GFR  Daily,   R     Question:  Specimen collection method  Answer:  Lab=Lab collect   05/03/24 0836           Antimicrobials/Microbiology: Anti-infectives (From admission, onward)    None      No results found for: SDES, SPECREQUEST, CULT, REPTSTATUS  Procedures: Procedure(s) (LRB): LEFT HEART CATH AND CORONARY ANGIOGRAPHY (N/A) Mennie LAMY, MD [facility] 05/05/2024, 10:59 AM

## 2024-05-05 NOTE — Consult Note (Addendum)
 [address]       [place] [address]             [phone]        ALPHIA BEHANNA [facility] Medical Record #[id number] Date of Birth: 1956/07/10  Referring: No ref. provider found Primary Care: Jeanette Comer BRAVO, PA-C Primary Cardiologist:Samuel Debera, MD  Chief Complaint:    Chief Complaint  Patient presents with   Chest Pain    History of Present Illness:    We are asked to see this 68 year old female in CT surgical consultation for consideration of CABG.  The patient has multiple cardiac risk factors including type 2 diabetes mellitus, obstructive sleep apnea, dyslipidemia and hypertension.  She also has a history of paroxysmal atrial fibrillation.  She is a former smoker having quit in 1996.  Patient presented to the emergency department on 05/01/2024 with complaints of chest pain.  He has a known history of CAD.  He underwent DES of the LAD in August 2024.  She had an abrupt occlusion of a diagonal that was jailed by the stent had successful POBA.  She has ruled in for non-STEMI.  Peak measured high-sensitivity troponin I was 415.  Recently she has noted worsening exertional chest pain that has been associated with shortness of breath, fatigue and dizziness as well as nausea and some numbness into the left forearm.  The symptoms are similar to her previous angina prior to stenting.  She is admitted for further evaluation and treatment including cardiology consultation.  She has continued to have some episodes of chest pain.  She underwent repeat cardiac catheterization 05/04/2024 where she was found to have severe one-vessel coronary artery disease due to in-stent restenosis in the proximal portion of the LAD stent.  She does have a 60% first diagonal lesion well is a 60% first marginal stenosis and a 35% mid RCA lesion.  Echocardiogram done 05/02/2024 shows LVEF by estimation to be in the 70 to 75% range.  She was noted to have left ventricular hyperdynamic  function.  She has mild aortic valve stenosis.     Current Activity/ Functional Status: Patient was independent with mobility/ambulation, transfers, ADL's, IADL's.   Zubrod Score: At the time of surgery this patient's most appropriate activity status/level should be described as: []     0    Normal activity, no symptoms []     1    Restricted in physical strenuous activity but ambulatory, able to do out light work [x]     2    Ambulatory and capable of self care, unable to do work activities, up and about                 more than 50%  Of the time                            []     3    Only limited self care, in bed greater than 50% of waking hours []     4    Completely disabled, no self care, confined to bed or chair []     5    Moribund  Past Medical History:  Diagnosis Date   ADD (attention deficit disorder)    Diabetes mellitus without complication (HCC)    Hypertension     Past Surgical History:  Procedure Laterality Date   ABDOMINAL HYSTERECTOMY     COLONOSCOPY  02/2022   CORONARY  PRESSURE/FFR STUDY N/A 04/16/2023   Procedure: CORONARY PRESSURE/FFR STUDY;  Surgeon: Swaziland, Peter M, MD;  Location: [facility] INVASIVE CV LAB;  Service: Cardiovascular;  Laterality: N/A;   CORONARY STENT INTERVENTION N/A 04/16/2023   Procedure: CORONARY STENT INTERVENTION;  Surgeon: Swaziland, Peter M, MD;  Location: [facility] INVASIVE CV LAB;  Service: Cardiovascular;  Laterality: N/A;   CORONARY ULTRASOUND/IVUS N/A 04/16/2023   Procedure: Coronary Ultrasound/IVUS;  Surgeon: Swaziland, Peter M, MD;  Location: [facility] INVASIVE CV LAB;  Service: Cardiovascular;  Laterality: N/A;   EXCISIONAL HEMORRHOIDECTOMY     INCONTINENCE SURGERY     LEFT HEART CATH AND CORONARY ANGIOGRAPHY N/A 05/04/2024   Procedure: LEFT HEART CATH AND CORONARY ANGIOGRAPHY;  Surgeon: Darron Deatrice LABOR, MD;  Location: [facility] INVASIVE CV LAB;  Service: Cardiovascular;  Laterality: N/A;   RIGHT/LEFT HEART CATH AND CORONARY ANGIOGRAPHY N/A 06/13/2021   Procedure:  RIGHT/LEFT HEART CATH AND CORONARY ANGIOGRAPHY;  Surgeon: Swaziland, Peter M, MD;  Location: [facility] INVASIVE CV LAB;  Service: Cardiovascular;  Laterality: N/A;   RIGHT/LEFT HEART CATH AND CORONARY ANGIOGRAPHY N/A 04/16/2023   Procedure: RIGHT/LEFT HEART CATH AND CORONARY ANGIOGRAPHY;  Surgeon: Swaziland, Peter M, MD;  Location: [facility] INVASIVE CV LAB;  Service: Cardiovascular;  Laterality: N/A;    Social History   Tobacco Use  Smoking Status Former   Current packs/day: 0.00   Types: Cigarettes   Quit date: 08/17/1994   Years since quitting: 29.7  Smokeless Tobacco Never    Social History   Substance and Sexual Activity  Alcohol  Use No     Allergies  Allergen Reactions   Azithromycin Hives   Codeine Nausea Only   Atomoxetine Nausea And Vomiting   Atorvastatin Other (See Comments)    Restless legs   Lisinopril Nausea And Vomiting    Current Facility-Administered Medications  Medication Dose Route Frequency Provider Last Rate Last Admin   0.9 %  sodium chloride  infusion  250 mL Intravenous PRN Arida, Muhammad A, MD       acetaminophen  (TYLENOL ) tablet 650 mg  650 mg Oral Q4H PRN Darron Deatrice LABOR, MD   650 mg at 05/05/24 0606   ALPRAZolam  (XANAX ) tablet 0.25 mg  0.25 mg Oral BID PRN Darron Deatrice LABOR, MD   0.25 mg at 05/04/24 1952   aspirin  EC tablet 81 mg  81 mg Oral Daily Arida, Muhammad A, MD   81 mg at 05/05/24 0855   busPIRone  (BUSPAR ) tablet 5 mg  5 mg Oral QHS PRN Darron Deatrice LABOR, MD       cholecalciferol  (VITAMIN D3) 25 MCG (1000 UNIT) tablet 5,000 Units  5,000 Units Oral Daily Arida, Muhammad A, MD   5,000 Units at 05/05/24 0854   citalopram  (CELEXA ) tablet 20 mg  20 mg Oral QPM Darron Deatrice A, MD   20 mg at 05/04/24 1728   donepezil  (ARICEPT ) tablet 5 mg  5 mg Oral QPM Arida, Muhammad A, MD   5 mg at 05/04/24 1729   fenofibrate  tablet 160 mg  160 mg Oral Daily Arida, Muhammad A, MD   160 mg at 05/05/24 0855   fentaNYL  (SUBLIMAZE ) injection 25-50 mcg  25-50 mcg Intravenous Q1H  PRN Darron Deatrice LABOR, MD   50 mcg at 05/05/24 0018   gabapentin  (NEURONTIN ) capsule 100 mg  100 mg Oral QHS Darron Deatrice A, MD   100 mg at 05/04/24 2134   heparin  ADULT infusion 100 units/mL (25000 units/250mL)  1,300 Units/hr Intravenous Continuous Christobal Guadalajara, MD 13 mL/hr at 05/05/24 0609 1,300  Units/hr at 05/05/24 0609   insulin  aspart (novoLOG ) injection 0-20 Units  0-20 Units Subcutaneous TID WC Arida, Muhammad A, MD   4 Units at 05/05/24 9146   insulin  aspart (novoLOG ) injection 0-5 Units  0-5 Units Subcutaneous QHS Arida, Muhammad A, MD   2 Units at 05/02/24 2109   insulin  glargine (LANTUS ) injection 25 Units  25 Units Subcutaneous Daily Arida, Muhammad A, MD   25 Units at 05/05/24 0853   levothyroxine  (SYNTHROID ) tablet 50 mcg  50 mcg Oral Q0600 Darron Deatrice LABOR, MD   50 mcg at 05/05/24 9392   loratadine  (CLARITIN ) tablet 10 mg  10 mg Oral q1800 Arida, Muhammad A, MD   10 mg at 05/04/24 1729   magnesium  oxide (MAG-OX) tablet 400 mg  400 mg Oral Daily Arida, Muhammad A, MD   400 mg at 05/05/24 9144   modafinil  (PROVIGIL ) tablet 100 mg  100 mg Oral Daily Arida, Muhammad A, MD   100 mg at 05/03/24 1145   montelukast  (SINGULAIR ) tablet 10 mg  10 mg Oral QHS Darron Deatrice LABOR, MD   10 mg at 05/04/24 2134   multivitamin with minerals tablet 1 tablet  1 tablet Oral Daily Arida, Muhammad A, MD   1 tablet at 05/05/24 0855   nitroGLYCERIN  (NITROSTAT ) SL tablet 0.4 mg  0.4 mg Sublingual Q5 min PRN Darron Deatrice A, MD   0.4 mg at 05/03/24 1912   ondansetron  (ZOFRAN ) injection 4 mg  4 mg Intravenous Q6H PRN Darron Deatrice LABOR, MD       oxyCODONE  (Oxy IR/ROXICODONE ) immediate release tablet 5 mg  5 mg Oral Q4H PRN Darron Deatrice LABOR, MD   5 mg at 05/05/24 0615   pantoprazole  (PROTONIX ) EC tablet 80 mg  80 mg Oral Daily Arida, Muhammad A, MD   80 mg at 05/05/24 0855   rOPINIRole  (REQUIP ) tablet 2 mg  2 mg Oral QHS Arida, Muhammad A, MD   2 mg at 05/04/24 2135   rosuvastatin  (CRESTOR ) tablet 40 mg  40  mg Oral Daily Arida, Muhammad A, MD   40 mg at 05/05/24 0854   sodium chloride  flush (NS) 0.9 % injection 3 mL  3 mL Intravenous Q12H Darron Deatrice A, MD   3 mL at 05/05/24 0856   sodium chloride  flush (NS) 0.9 % injection 3 mL  3 mL Intravenous PRN Arida, Muhammad A, MD   3 mL at 05/04/24 2137   traZODone  (DESYREL ) tablet 25 mg  25 mg Oral QHS PRN Darron Deatrice LABOR, MD   25 mg at 05/04/24 2135    Medications Prior to Admission  Medication Sig Dispense Refill Last Dose/Taking   busPIRone  (BUSPAR ) 5 MG tablet Take 5 mg by mouth at bedtime.   05/01/2024   Cholecalciferol  (VITAMIN D3) 125 MCG (5000 UT) CAPS Take 1 capsule (5,000 Units total) by mouth daily. 90 capsule 0 Past Week   citalopram  (CELEXA ) 20 MG tablet Take 1 tablet (20 mg total) by mouth every evening. 30 tablet 4 05/01/2024 Evening   clopidogrel  (PLAVIX ) 75 MG tablet TAKE 1 TABLET BY MOUTH DAILY 100 tablet 2 05/01/2024 at  9:00 PM   cyclobenzaprine (FLEXERIL) 10 MG tablet Take 10 mg by mouth at bedtime.   05/01/2024 Bedtime   donepezil  (ARICEPT ) 5 MG tablet Take 5 mg by mouth every evening.   05/01/2024 Evening   empagliflozin  (JARDIANCE ) 25 MG TABS tablet Take 1 tablet (25 mg total) by mouth daily. 90 tablet 1 05/01/2024 Morning   estradiol  (ESTRACE )  1 MG tablet Take 1 mg by mouth daily.   05/01/2024 Evening   fenofibrate  (TRICOR ) 145 MG tablet Take 145 mg by mouth daily.   05/01/2024 Evening   gabapentin  (NEURONTIN ) 100 MG capsule Take 1 capsule (100 mg total) by mouth at bedtime.   05/01/2024 Bedtime   indapamide  (LOZOL ) 2.5 MG tablet Take 1 tablet (2.5 mg total) by mouth daily. 90 tablet 3 05/01/2024 Evening   insulin  degludec (TRESIBA  FLEXTOUCH) 200 UNIT/ML FlexTouch Pen Inject 110 Units into the skin at bedtime. 24 mL 2 05/01/2024 Bedtime   insulin  lispro (HUMALOG  KWIKPEN) 100 UNIT/ML KwikPen Inject 16-22 Units into the skin 3 (three) times daily before meals. 60 mL 0 Past Week   isosorbide  mononitrate (IMDUR ) 60 MG 24 hr tablet Take 1.5  tablets (90 mg total) by mouth daily. Can take an additional half tab as needed for chest pain 135 tablet 1 05/01/2024 Evening   levocetirizine (XYZAL ) 5 MG tablet Take 5 mg by mouth every evening.   05/01/2024 Evening   levothyroxine  (SYNTHROID ) 50 MCG tablet TAKE 1 TABLET BY MOUTH DAILY  BEFORE BREAKFAST 90 tablet 3 05/01/2024 Evening   Magnesium  Oxide (MAGNESIUM  OXIDE 400) 240 MG PACK Take 400 mg by mouth daily.   05/01/2024 Morning   metFORMIN  (GLUCOPHAGE ) 500 MG tablet TAKE 1 TABLET BY MOUTH DAILY  WITH BREAKFAST (Patient taking differently: Take 500 mg by mouth every evening.) 90 tablet 3 05/01/2024 Evening   modafinil  (PROVIGIL ) 100 MG tablet Take 1 tablet (100 mg total) by mouth daily. 90 tablet 0 05/01/2024 Evening   montelukast  (SINGULAIR ) 10 MG tablet Take 10 mg by mouth at bedtime.   05/01/2024 Bedtime   Multiple Vitamin (MULTIVITAMIN ADULT PO) Take 1 tablet by mouth daily.   05/01/2024 Morning   nitroGLYCERIN  (NITROSTAT ) 0.4 MG SL tablet Place 1 tablet (0.4 mg total) under the tongue every 5 (five) minutes as needed for chest pain. 75 tablet 1 05/01/2024 Evening   OVER THE COUNTER MEDICATION Take 2 capsules by mouth daily. Omega XL   05/01/2024 Morning   pantoprazole  (PROTONIX ) 40 MG tablet TAKE 2 TABLETS EVERY DAY 180 tablet 1 05/01/2024 Evening   rOPINIRole  (REQUIP ) 2 MG tablet Take 2 mg by mouth at bedtime.   05/01/2024 Bedtime   rosuvastatin  (CRESTOR ) 40 MG tablet Take 1 tablet (40 mg total) by mouth daily. 30 tablet 5 05/01/2024 Evening   Semaglutide ,0.25 or 0.5MG /DOS, (OZEMPIC , 0.25 OR 0.5 MG/DOSE,) 2 MG/1.5ML SOPN Inject 0.5 mg into the skin once a week.   04/27/2024   Vibegron  (GEMTESA ) 75 MG TABS Take 1 tablet (75 mg total) by mouth daily. 30 tablet 11 05/01/2024 Evening   warfarin (COUMADIN ) 5 MG tablet TAKE ONE-HALF TABLET BY MOUTH  DAILY EXCEPT 1 TABLET BY MOUTH  ON SUNDAYS, TUESDAYS, THURSDAYS, SATURDAYS OR AS DIRECTED (Patient taking differently: Take 5 mg by mouth. Take one tablet every  day except on Monday and Thursday take 1/2 tablet.) 100 tablet 0 05/01/2024 at  9:30 PM   Accu-Chek Softclix Lancets lancets Use as instructed to test blood glucose four times daily 100 each 2    Continuous Glucose Receiver (FREESTYLE LIBRE 2 READER) DEVI USE TO CHECK GLUCOSE AS DIRECTED 1 each 0    Continuous Glucose Sensor (FREESTYLE LIBRE 2 SENSOR) MISC CHANGE SENSOR EVERY 14 DAYS USE  TO CHECK BLOOD GLUCOSE  CONTINUOUSLY 6 each 3    glucose blood test strip 1 each by Other route as needed. Use as instructed to test blood glucose four  times daily 100 each 2    Insulin  Pen Needle (B-D ULTRAFINE III SHORT PEN) 31G X 8 MM MISC 1 each by Does not apply route as directed. 100 each 2     Family History  Problem Relation Age of Onset   Cancer Mother    Diabetes Father    Heart Problems Father    COPD Father    Emphysema Father    Heart block Father    Congestive Heart Failure Father      Review of Systems:   Review of Systems  Constitutional:  Positive for malaise/fatigue.  Respiratory:  Positive for shortness of breath.   Cardiovascular:  Positive for chest pain.  Gastrointestinal:  Positive for nausea.  Musculoskeletal:  Positive for back pain.  Psychiatric/Behavioral:  The patient is nervous/anxious.           Physical Exam: BP (!) 122/46 (BP Location: Right Arm)   Pulse (!) 53   Temp 98.1 F (36.7 C) (Oral)   Resp 17   Ht 5' 4 (1.626 m)   Wt 98.3 kg   SpO2 92%   BMI 37.20 kg/m    General appearance: alert, cooperative, and no distress Head: Normocephalic, without obvious abnormality, atraumatic Neck: no adenopathy, no JVD, supple, symmetrical, trachea midline, thyroid  not enlarged, symmetric, no tenderness/mass/nodules, and + carotid bruits Lymph nodes: Cervical, supraclavicular, and axillary nodes normal. Resp: clear to auscultation bilaterally Back: symmetric, no curvature. ROM normal. No CVA tenderness. Cardio: regular rate and rhythm, S1, S2 normal, no murmur,  click, rub or gallop GI: soft, non-tender; bowel sounds normal; no masses,  no organomegaly Extremities: extremities normal, atraumatic, no cyanosis or edema and pedal pulses weak Neurologic: Grossly normal  Diagnostic Studies & Laboratory data:     Recent Radiology Findings:   CARDIAC CATHETERIZATION Result Date: 05/04/2024   Prox RCA to Mid RCA lesion is 35% stenosed.   1st Mrg lesion is 60% stenosed.   Prox LAD lesion is 95% stenosed.   1st Diag lesion is 60% stenosed. 1.  Severe one-vessel coronary artery disease due to in-stent restenosis in the proximal portion of the LAD stent that was placed last year.  Stable left circumflex disease. 2.  Left ventricular angiography was not performed.  EF was normal by echo. 3.  Mild gradient across the aortic valve. 4.  Severe right radial artery spasm. Recommendations: The patient has very aggressive restenosis in the proximal LAD.  Management options include CABG versus drug-coated balloon angioplasty.  Will discuss during the heart meeting tomorrow.  If PCI is pursued, it will have to be done via the femoral approach. Resume heparin  drip 2 hours after TR band removal. I held clopidogrel  for now in case CABG is needed.     I have independently reviewed the above radiologic studies and discussed with the patient   Recent Lab Findings: Lab Results  Component Value Date   WBC 9.7 05/05/2024   HGB 14.2 05/05/2024   HCT 42.5 05/05/2024   PLT 249 05/05/2024   GLUCOSE 269 (H) 05/05/2024   CHOL 149 05/03/2024   TRIG 201 (H) 05/03/2024   HDL 34 (L) 05/03/2024   LDLCALC 75 05/03/2024   ALT 32 09/17/2023   AST 34 09/17/2023   NA 134 (L) 05/05/2024   K 4.4 05/05/2024   CL 101 05/05/2024   CREATININE 1.03 (H) 05/05/2024   BUN 23 05/05/2024   CO2 24 05/05/2024   TSH 1.487 05/03/2024   INR 1.5 (H) 05/05/2024   HGBA1C  10.6 (A) 07/14/2023      Assessment / Plan: Severe coronary artery disease in the setting of non-STEMI with in-stent stenosis of  LAD.  He does have some milder disease in the RCA and an OM branch. History of poorly controlled DM 2 Prior tobacco abuse Hypertension Hyperlipidemia History of PAF Mote history of migraines Increased BMI 37.2 History of kidney stones History of obstructive sleep apnea with CPAP Restless leg syndrome History of urge incontinence History of colon polyps History of overactive bladder History of recurrent UTI   She has been on Plavix  so ideally a washout period should be undertaken.  She has had some episodes of chest pain and cardiology is increasing medical management.  I have ordered a P2 Y12.  She does have bilateral carotid bruits and routine vascular studies will be done preoperatively.   Surgeon will evaluate the patient and all relevant studies and make final recommendations.  I  spent 30 minutes counseling the patient face to face.  Lemond FORBES Cera, PA-C  05/05/2024 9:49 AM   Pain improved now on nitrogl gtt. Will plan for CABG tomorrow  Linnie MALVA Rayas

## 2024-05-05 NOTE — Progress Notes (Signed)
 1006 - Patient called out with chest pain with radiation to jaw - 9/10 - BP 153/76 96% RA - pt stated fentanyl  worked rather than the nitro with adjunct of oxy and requested that to be given. 12 lead EKG obtained, O2 applied at 2L - 25mcg of fentanyl  given as ordered. Rachel Odonnell, [place] with cardiac surgery at bedside. 1016 repeat BP 116/69,  patient's pain down to a 4/10 - patient requested to take Oxy - given as ordered. Repeat BP @ 1025 - 121/54 - pain down to a 1/10 at 1030 - patient resting comfortably in bed without complaints with husband at bedside. Attending made aware - Will update primary RN Tawni and continue to monitor.

## 2024-05-06 ENCOUNTER — Inpatient Hospital Stay (HOSPITAL_COMMUNITY)

## 2024-05-06 ENCOUNTER — Inpatient Hospital Stay (HOSPITAL_COMMUNITY)
Admission: EM | Disposition: A | Discharge status: Home Health | Payer: Self-pay | Source: Home / Self Care | Attending: Thoracic Surgery (Cardiothoracic Vascular Surgery)

## 2024-05-06 ENCOUNTER — Inpatient Hospital Stay (HOSPITAL_COMMUNITY): Payer: Self-pay | Admitting: Certified Registered Nurse Anesthetist

## 2024-05-06 ENCOUNTER — Other Ambulatory Visit: Payer: Self-pay

## 2024-05-06 DIAGNOSIS — G4733 Obstructive sleep apnea (adult) (pediatric): Secondary | ICD-10-CM | POA: Diagnosis not present

## 2024-05-06 DIAGNOSIS — K219 Gastro-esophageal reflux disease without esophagitis: Secondary | ICD-10-CM

## 2024-05-06 DIAGNOSIS — E1165 Type 2 diabetes mellitus with hyperglycemia: Secondary | ICD-10-CM

## 2024-05-06 DIAGNOSIS — I2510 Atherosclerotic heart disease of native coronary artery without angina pectoris: Secondary | ICD-10-CM | POA: Diagnosis not present

## 2024-05-06 DIAGNOSIS — Z951 Presence of aortocoronary bypass graft: Secondary | ICD-10-CM | POA: Diagnosis not present

## 2024-05-06 HISTORY — PX: CORONARY ARTERY BYPASS GRAFT: SHX141

## 2024-05-06 HISTORY — PX: INTRAOPERATIVE TRANSESOPHAGEAL ECHOCARDIOGRAM: SHX5062

## 2024-05-06 LAB — POCT I-STAT, CHEM 8
BUN: 21 mg/dL (ref 8–23)
BUN: 23 mg/dL (ref 8–23)
BUN: 21 mg/dL (ref 8–23)
Calcium, Ion: 1.41 mmol/L — ABNORMAL HIGH (ref 1.15–1.40)
Calcium, Ion: 1.28 mmol/L (ref 1.15–1.40)
Calcium, Ion: 1.28 mmol/L (ref 1.15–1.40)
Chloride: 101 mmol/L (ref 98–111)
Chloride: 102 mmol/L (ref 98–111)
Chloride: 101 mmol/L (ref 98–111)
Creatinine, Ser: 0.9 mg/dL (ref 0.44–1.00)
Creatinine, Ser: 0.9 mg/dL (ref 0.44–1.00)
Creatinine, Ser: 1 mg/dL (ref 0.44–1.00)
Glucose, Bld: 170 mg/dL — ABNORMAL HIGH (ref 70–99)
Glucose, Bld: 185 mg/dL — ABNORMAL HIGH (ref 70–99)
Glucose, Bld: 189 mg/dL — ABNORMAL HIGH (ref 70–99)
HCT: 41 % (ref 36.0–46.0)
HCT: 37 % (ref 36.0–46.0)
HCT: 37 % (ref 36.0–46.0)
Hemoglobin: 13.9 g/dL (ref 12.0–15.0)
Hemoglobin: 12.6 g/dL (ref 12.0–15.0)
Hemoglobin: 12.6 g/dL (ref 12.0–15.0)
Potassium: 3.9 mmol/L (ref 3.5–5.1)
Potassium: 4.1 mmol/L (ref 3.5–5.1)
Potassium: 3.9 mmol/L (ref 3.5–5.1)
Sodium: 138 mmol/L (ref 135–145)
Sodium: 136 mmol/L (ref 135–145)
Sodium: 136 mmol/L (ref 135–145)
TCO2: 27 mmol/L (ref 22–32)
TCO2: 26 mmol/L (ref 22–32)
TCO2: 25 mmol/L (ref 22–32)

## 2024-05-06 LAB — POCT I-STAT 7, (LYTES, BLD GAS, ICA,H+H)
Acid-base deficit: 1 mmol/L (ref 0.0–2.0)
Acid-base deficit: 4 mmol/L — ABNORMAL HIGH (ref 0.0–2.0)
Acid-base deficit: 4 mmol/L — ABNORMAL HIGH (ref 0.0–2.0)
Acid-base deficit: 5 mmol/L — ABNORMAL HIGH (ref 0.0–2.0)
Acid-base deficit: 3 mmol/L — ABNORMAL HIGH (ref 0.0–2.0)
Acid-base deficit: 1 mmol/L (ref 0.0–2.0)
Bicarbonate: 23.6 mmol/L (ref 20.0–28.0)
Bicarbonate: 23 mmol/L (ref 20.0–28.0)
Bicarbonate: 22.2 mmol/L (ref 20.0–28.0)
Bicarbonate: 22.8 mmol/L (ref 20.0–28.0)
Bicarbonate: 23.7 mmol/L (ref 20.0–28.0)
Bicarbonate: 25.7 mmol/L (ref 20.0–28.0)
Calcium, Ion: 1.28 mmol/L (ref 1.15–1.40)
Calcium, Ion: 1.29 mmol/L (ref 1.15–1.40)
Calcium, Ion: 1.29 mmol/L (ref 1.15–1.40)
Calcium, Ion: 1.26 mmol/L (ref 1.15–1.40)
Calcium, Ion: 1.25 mmol/L (ref 1.15–1.40)
Calcium, Ion: 1.3 mmol/L (ref 1.15–1.40)
HCT: 37 % (ref 36.0–46.0)
HCT: 36 % (ref 36.0–46.0)
HCT: 36 % (ref 36.0–46.0)
HCT: 36 % (ref 36.0–46.0)
HCT: 34 % — ABNORMAL LOW (ref 36.0–46.0)
HCT: 36 % (ref 36.0–46.0)
Hemoglobin: 12.6 g/dL (ref 12.0–15.0)
Hemoglobin: 12.2 g/dL (ref 12.0–15.0)
Hemoglobin: 12.2 g/dL (ref 12.0–15.0)
Hemoglobin: 12.2 g/dL (ref 12.0–15.0)
Hemoglobin: 11.6 g/dL — ABNORMAL LOW (ref 12.0–15.0)
Hemoglobin: 12.2 g/dL (ref 12.0–15.0)
O2 Saturation: 97 %
O2 Saturation: 90 %
O2 Saturation: 92 %
O2 Saturation: 92 %
O2 Saturation: 97 %
O2 Saturation: 98 %
Patient temperature: 36.2
Patient temperature: 37.2
Patient temperature: 37.3
Patient temperature: 38.4
Patient temperature: 38.7
Patient temperature: 99
Potassium: 3.8 mmol/L (ref 3.5–5.1)
Potassium: 4.3 mmol/L (ref 3.5–5.1)
Potassium: 4.5 mmol/L (ref 3.5–5.1)
Potassium: 4.2 mmol/L (ref 3.5–5.1)
Potassium: 4.2 mmol/L (ref 3.5–5.1)
Potassium: 5.1 mmol/L (ref 3.5–5.1)
Sodium: 137 mmol/L (ref 135–145)
Sodium: 139 mmol/L (ref 135–145)
Sodium: 138 mmol/L (ref 135–145)
Sodium: 138 mmol/L (ref 135–145)
Sodium: 138 mmol/L (ref 135–145)
Sodium: 138 mmol/L (ref 135–145)
TCO2: 25 mmol/L (ref 22–32)
TCO2: 25 mmol/L (ref 22–32)
TCO2: 24 mmol/L (ref 22–32)
TCO2: 24 mmol/L (ref 22–32)
TCO2: 25 mmol/L (ref 22–32)
TCO2: 27 mmol/L (ref 22–32)
pCO2 arterial: 38.9 mmHg (ref 32–48)
pCO2 arterial: 51.9 mmHg — ABNORMAL HIGH (ref 32–48)
pCO2 arterial: 45.8 mmHg (ref 32–48)
pCO2 arterial: 54.2 mmHg — ABNORMAL HIGH (ref 32–48)
pCO2 arterial: 51.2 mmHg — ABNORMAL HIGH (ref 32–48)
pCO2 arterial: 49 mmHg — ABNORMAL HIGH (ref 32–48)
pH, Arterial: 7.387 (ref 7.35–7.45)
pH, Arterial: 7.255 — ABNORMAL LOW (ref 7.35–7.45)
pH, Arterial: 7.295 — ABNORMAL LOW (ref 7.35–7.45)
pH, Arterial: 7.24 — ABNORMAL LOW (ref 7.35–7.45)
pH, Arterial: 7.281 — ABNORMAL LOW (ref 7.35–7.45)
pH, Arterial: 7.329 — ABNORMAL LOW (ref 7.35–7.45)
pO2, Arterial: 90 mmHg (ref 83–108)
pO2, Arterial: 68 mmHg — ABNORMAL LOW (ref 83–108)
pO2, Arterial: 74 mmHg — ABNORMAL LOW (ref 83–108)
pO2, Arterial: 80 mmHg — ABNORMAL LOW (ref 83–108)
pO2, Arterial: 116 mmHg — ABNORMAL HIGH (ref 83–108)
pO2, Arterial: 117 mmHg — ABNORMAL HIGH (ref 83–108)

## 2024-05-06 LAB — BASIC METABOLIC PANEL WITH GFR
Anion gap: 12 (ref 5–15)
Anion gap: 9 (ref 5–15)
BUN: 19 mg/dL (ref 8–23)
BUN: 20 mg/dL (ref 8–23)
CO2: 24 mmol/L (ref 22–32)
CO2: 24 mmol/L (ref 22–32)
Calcium: 9.8 mg/dL (ref 8.9–10.3)
Calcium: 8.9 mg/dL (ref 8.9–10.3)
Chloride: 101 mmol/L (ref 98–111)
Chloride: 103 mmol/L (ref 98–111)
Creatinine, Ser: 1.04 mg/dL — ABNORMAL HIGH (ref 0.44–1.00)
Creatinine, Ser: 1.09 mg/dL — ABNORMAL HIGH (ref 0.44–1.00)
GFR, Estimated: 59 mL/min — ABNORMAL LOW (ref >60)
GFR, Estimated: 55 mL/min — ABNORMAL LOW (ref >60)
Glucose, Bld: 178 mg/dL — ABNORMAL HIGH (ref 70–99)
Glucose, Bld: 153 mg/dL — ABNORMAL HIGH (ref 70–99)
Potassium: 3.8 mmol/L (ref 3.5–5.1)
Potassium: 4.6 mmol/L (ref 3.5–5.1)
Sodium: 137 mmol/L (ref 135–145)
Sodium: 136 mmol/L (ref 135–145)

## 2024-05-06 LAB — CBC
HCT: 44.3 % (ref 36.0–46.0)
HCT: 38 % (ref 36.0–46.0)
HCT: 36.8 % (ref 36.0–46.0)
Hemoglobin: 14.7 g/dL (ref 12.0–15.0)
Hemoglobin: 12.8 g/dL (ref 12.0–15.0)
Hemoglobin: 12.1 g/dL (ref 12.0–15.0)
MCH: 30.3 pg (ref 26.0–34.0)
MCH: 30.9 pg (ref 26.0–34.0)
MCH: 30.4 pg (ref 26.0–34.0)
MCHC: 33.2 g/dL (ref 30.0–36.0)
MCHC: 33.7 g/dL (ref 30.0–36.0)
MCHC: 32.9 g/dL (ref 30.0–36.0)
MCV: 91.3 fL (ref 80.0–100.0)
MCV: 91.8 fL (ref 80.0–100.0)
MCV: 92.5 fL (ref 80.0–100.0)
Platelets: 263 K/uL (ref 150–400)
Platelets: 275 K/uL (ref 150–400)
Platelets: 274 K/uL (ref 150–400)
RBC: 4.85 MIL/uL (ref 3.87–5.11)
RBC: 4.14 MIL/uL (ref 3.87–5.11)
RBC: 3.98 MIL/uL (ref 3.87–5.11)
RDW: 13.1 % (ref 11.5–15.5)
RDW: 13.3 % (ref 11.5–15.5)
RDW: 13.2 % (ref 11.5–15.5)
WBC: 10.2 K/uL (ref 4.0–10.5)
WBC: 13.8 K/uL — ABNORMAL HIGH (ref 4.0–10.5)
WBC: 12.7 K/uL — ABNORMAL HIGH (ref 4.0–10.5)
nRBC: 0 % (ref 0.0–0.2)
nRBC: 0 % (ref 0.0–0.2)
nRBC: 0 % (ref 0.0–0.2)

## 2024-05-06 LAB — GLUCOSE, CAPILLARY
Glucose-Capillary: 178 mg/dL — ABNORMAL HIGH (ref 70–99)
Glucose-Capillary: 155 mg/dL — ABNORMAL HIGH (ref 70–99)
Glucose-Capillary: 165 mg/dL — ABNORMAL HIGH (ref 70–99)
Glucose-Capillary: 157 mg/dL — ABNORMAL HIGH (ref 70–99)
Glucose-Capillary: 145 mg/dL — ABNORMAL HIGH (ref 70–99)
Glucose-Capillary: 162 mg/dL — ABNORMAL HIGH (ref 70–99)
Glucose-Capillary: 157 mg/dL — ABNORMAL HIGH (ref 70–99)
Glucose-Capillary: 139 mg/dL — ABNORMAL HIGH (ref 70–99)
Glucose-Capillary: 171 mg/dL — ABNORMAL HIGH (ref 70–99)
Glucose-Capillary: 129 mg/dL — ABNORMAL HIGH (ref 70–99)
Glucose-Capillary: 124 mg/dL — ABNORMAL HIGH (ref 70–99)
Glucose-Capillary: 135 mg/dL — ABNORMAL HIGH (ref 70–99)

## 2024-05-06 LAB — LACTIC ACID, PLASMA: Lactic Acid, Venous: 1.1 mmol/L (ref 0.5–1.9)

## 2024-05-06 LAB — HEPARIN LEVEL (UNFRACTIONATED): Heparin Unfractionated: 0.3 [IU]/mL (ref 0.30–0.70)

## 2024-05-06 LAB — PROTIME-INR
INR: 1.5 — ABNORMAL HIGH (ref 0.8–1.2)
Prothrombin Time: 18.6 s — ABNORMAL HIGH (ref 11.4–15.2)

## 2024-05-06 LAB — APTT: aPTT: 31 s (ref 24–36)

## 2024-05-06 LAB — MAGNESIUM: Magnesium: 2.7 mg/dL — ABNORMAL HIGH (ref 1.7–2.4)

## 2024-05-06 SURGERY — OFF PUMP CORONARY ARTERY BYPASS GRAFTING (CABG)
Anesthesia: General | Site: Chest

## 2024-05-06 MED ORDER — CHLORHEXIDINE GLUCONATE 0.12 % MT SOLN: 15.0000 mL | Freq: Once | OROMUCOSAL | Status: DC

## 2024-05-06 MED ORDER — ASPIRIN 81 MG PO CHEW
324.0000 mg | CHEWABLE_TABLET | Freq: Once | ORAL | Status: AC
Start: 2024-05-06 — End: 2024-05-06
  Administered 2024-05-06: 324 mg via ORAL
  Filled 2024-05-06: qty 4

## 2024-05-06 MED ORDER — VANCOMYCIN HCL IN DEXTROSE 1-5 GM/200ML-% IV SOLN
1000.0000 mg | Freq: Once | INTRAVENOUS | Status: AC
  Administered 2024-05-06: 1000 mg via INTRAVENOUS
  Filled 2024-05-06: qty 200

## 2024-05-06 MED ORDER — MAGNESIUM SULFATE 4 GM/100ML IV SOLN
4.0000 g | Freq: Once | INTRAVENOUS | Status: AC
Start: 2024-05-06 — End: 2024-05-06
  Administered 2024-05-06: 4 g via INTRAVENOUS
  Filled 2024-05-06: qty 100

## 2024-05-06 MED ORDER — MIDAZOLAM HCL 2 MG/2ML IJ SOLN: 2.0000 mg | INTRAMUSCULAR | Status: DC | PRN

## 2024-05-06 MED ORDER — ASPIRIN 81 MG PO CHEW
324.0000 mg | CHEWABLE_TABLET | Freq: Every day | ORAL | Status: DC
  Administered 2024-05-10: 324 mg
  Filled 2024-05-06 (×2): qty 4

## 2024-05-06 MED ORDER — DEXTROSE 50 % IV SOLN: 0.0000 mL | INTRAVENOUS | Status: DC | PRN

## 2024-05-06 MED ORDER — PROTAMINE SULFATE 10 MG/ML IV SOLN
INTRAVENOUS | Status: DC | PRN
  Administered 2024-05-06: 130 mg via INTRAVENOUS
  Administered 2024-05-06: 20 mg via INTRAVENOUS

## 2024-05-06 MED ORDER — ONDANSETRON HCL 4 MG/2ML IJ SOLN
4.0000 mg | Freq: Four times a day (QID) | INTRAMUSCULAR | Status: DC | PRN
  Administered 2024-05-08 – 2024-05-09 (×2): 4 mg via INTRAVENOUS
  Filled 2024-05-06 (×2): qty 2

## 2024-05-06 MED ORDER — 0.9 % SODIUM CHLORIDE (POUR BTL) OPTIME
TOPICAL | Status: DC | PRN
  Administered 2024-05-06: 5000 mL

## 2024-05-06 MED ORDER — CEFAZOLIN SODIUM-DEXTROSE 2-4 GM/100ML-% IV SOLN
2.0000 g | Freq: Three times a day (TID) | INTRAVENOUS | Status: AC
  Administered 2024-05-06 – 2024-05-08 (×6): 2 g via INTRAVENOUS
  Filled 2024-05-06 (×6): qty 100

## 2024-05-06 MED ORDER — CHLORHEXIDINE GLUCONATE 0.12 % MT SOLN
15.0000 mL | OROMUCOSAL | Status: AC
  Administered 2024-05-06: 15 mL via OROMUCOSAL
  Filled 2024-05-06: qty 15

## 2024-05-06 MED ORDER — FENTANYL CITRATE (PF) 250 MCG/5ML IJ SOLN
INTRAMUSCULAR | Status: AC
  Filled 2024-05-06: qty 5

## 2024-05-06 MED ORDER — ACETAMINOPHEN 160 MG/5ML PO SOLN
1000.0000 mg | Freq: Four times a day (QID) | ORAL | Status: AC
Start: 2024-05-07 — End: 2024-05-11
  Administered 2024-05-11: 1000 mg
  Filled 2024-05-06: qty 40.6

## 2024-05-06 MED ORDER — ONDANSETRON HCL 4 MG/2ML IJ SOLN
INTRAMUSCULAR | Status: DC | PRN
  Administered 2024-05-06: 4 mg via INTRAVENOUS

## 2024-05-06 MED ORDER — LACTATED RINGERS IV SOLN: INTRAVENOUS | Status: DC | PRN

## 2024-05-06 MED ORDER — SODIUM BICARBONATE 8.4 % IV SOLN
50.0000 meq | Freq: Once | INTRAVENOUS | Status: AC
  Administered 2024-05-06: 50 meq via INTRAVENOUS

## 2024-05-06 MED ORDER — METOPROLOL TARTRATE 12.5 MG HALF TABLET
12.5000 mg | ORAL_TABLET | Freq: Two times a day (BID) | ORAL | Status: DC
  Administered 2024-05-08: 12.5 mg via ORAL
  Filled 2024-05-06: qty 1

## 2024-05-06 MED ORDER — LACTATED RINGERS IV SOLN: INTRAVENOUS | Status: DC

## 2024-05-06 MED ORDER — SODIUM CHLORIDE 0.9 % IV SOLN: INTRAVENOUS | Status: AC

## 2024-05-06 MED ORDER — PANTOPRAZOLE SODIUM 40 MG IV SOLR
40.0000 mg | Freq: Every day | INTRAVENOUS | Status: AC
Start: 2024-05-06 — End: 2024-05-08
  Administered 2024-05-06 – 2024-05-07 (×2): 40 mg via INTRAVENOUS
  Filled 2024-05-06 (×2): qty 10

## 2024-05-06 MED ORDER — PANTOPRAZOLE SODIUM 40 MG PO TBEC
40.0000 mg | DELAYED_RELEASE_TABLET | Freq: Every day | ORAL | Status: DC
Start: 2024-05-08 — End: 2024-05-13
  Administered 2024-05-08 – 2024-05-13 (×6): 40 mg via ORAL
  Filled 2024-05-06 (×7): qty 1

## 2024-05-06 MED ORDER — SODIUM CHLORIDE 0.45 % IV SOLN: INTRAVENOUS | Status: AC | PRN

## 2024-05-06 MED ORDER — MIDAZOLAM HCL (PF) 10 MG/2ML IJ SOLN
INTRAMUSCULAR | Status: AC
  Filled 2024-05-06: qty 2

## 2024-05-06 MED ORDER — PHENYLEPHRINE HCL-NACL 20-0.9 MG/250ML-% IV SOLN
0.0000 ug/min | INTRAVENOUS | Status: DC
  Administered 2024-05-06: 25 ug/min via INTRAVENOUS
  Administered 2024-05-06: 30 ug/min via INTRAVENOUS
  Filled 2024-05-06: qty 250

## 2024-05-06 MED ORDER — HEPARIN SODIUM (PORCINE) 1000 UNIT/ML IJ SOLN
INTRAMUSCULAR | Status: DC | PRN
  Administered 2024-05-06: 15000 [IU] via INTRAVENOUS

## 2024-05-06 MED ORDER — MIDAZOLAM HCL (PF) 5 MG/ML IJ SOLN
INTRAMUSCULAR | Status: DC | PRN
  Administered 2024-05-06 (×3): 1 mg via INTRAVENOUS

## 2024-05-06 MED ORDER — MORPHINE SULFATE (PF) 2 MG/ML IV SOLN
1.0000 mg | INTRAVENOUS | Status: DC | PRN
  Administered 2024-05-06: 2 mg via INTRAVENOUS
  Administered 2024-05-06: 4 mg via INTRAVENOUS
  Administered 2024-05-06 (×2): 2 mg via INTRAVENOUS
  Administered 2024-05-06: 4 mg via INTRAVENOUS
  Administered 2024-05-07: 2 mg via INTRAVENOUS
  Administered 2024-05-07: 4 mg via INTRAVENOUS
  Administered 2024-05-07: 2 mg via INTRAVENOUS
  Administered 2024-05-07: 4 mg via INTRAVENOUS
  Administered 2024-05-07 – 2024-05-11 (×3): 2 mg via INTRAVENOUS
  Filled 2024-05-06: qty 2
  Filled 2024-05-06: qty 1
  Filled 2024-05-06: qty 2
  Filled 2024-05-06 (×3): qty 1
  Filled 2024-05-06: qty 2
  Filled 2024-05-06: qty 1
  Filled 2024-05-06: qty 2
  Filled 2024-05-06 (×4): qty 1

## 2024-05-06 MED ORDER — METOPROLOL TARTRATE 5 MG/5ML IV SOLN: 2.5000 mg | INTRAVENOUS | Status: DC | PRN

## 2024-05-06 MED ORDER — ACETAMINOPHEN 160 MG/5ML PO SOLN
650.0000 mg | Freq: Once | ORAL | Status: DC
  Filled 2024-05-06: qty 20.3

## 2024-05-06 MED ORDER — POTASSIUM CHLORIDE 10 MEQ/50ML IV SOLN
10.0000 meq | INTRAVENOUS | Status: AC
  Administered 2024-05-06 (×3): 10 meq via INTRAVENOUS

## 2024-05-06 MED ORDER — SODIUM CHLORIDE 0.9% FLUSH
3.0000 mL | Freq: Two times a day (BID) | INTRAVENOUS | Status: DC
  Administered 2024-05-07 – 2024-05-12 (×9): 3 mL via INTRAVENOUS

## 2024-05-06 MED ORDER — LACTATED RINGERS IV SOLN: INTRAVENOUS | Status: AC

## 2024-05-06 MED ORDER — DOCUSATE SODIUM 100 MG PO CAPS
200.0000 mg | ORAL_CAPSULE | Freq: Every day | ORAL | Status: DC
  Administered 2024-05-07 – 2024-05-13 (×4): 200 mg via ORAL
  Filled 2024-05-06 (×6): qty 2

## 2024-05-06 MED ORDER — SODIUM CHLORIDE 0.9 % IV SOLN: 250.0000 mL | INTRAVENOUS | Status: AC

## 2024-05-06 MED ORDER — ACETAMINOPHEN 160 MG/5ML PO SOLN
650.0000 mg | Freq: Once | ORAL | Status: AC
  Administered 2024-05-06: 650 mg via ORAL
  Filled 2024-05-06: qty 20.3

## 2024-05-06 MED ORDER — DEXMEDETOMIDINE HCL IN NACL 400 MCG/100ML IV SOLN
0.0000 ug/kg/h | INTRAVENOUS | Status: DC
  Administered 2024-05-06: 0.7 ug/kg/h via INTRAVENOUS
  Administered 2024-05-06: 0.1 ug/kg/h via INTRAVENOUS
  Filled 2024-05-06: qty 100

## 2024-05-06 MED ORDER — INSULIN REGULAR(HUMAN) IN NACL 100-0.9 UT/100ML-% IV SOLN
INTRAVENOUS | Status: AC
  Administered 2024-05-06: 4.8 [IU]/h via INTRAVENOUS
  Administered 2024-05-07: 2.4 [IU]/h via INTRAVENOUS
  Filled 2024-05-06: qty 100

## 2024-05-06 MED ORDER — ACETAMINOPHEN 500 MG PO TABS
1000.0000 mg | ORAL_TABLET | Freq: Four times a day (QID) | ORAL | Status: AC
Start: 2024-05-07 — End: 2024-05-11
  Administered 2024-05-07 – 2024-05-11 (×17): 1000 mg via ORAL
  Filled 2024-05-06 (×20): qty 2

## 2024-05-06 MED ORDER — OXYCODONE HCL 5 MG PO TABS
5.0000 mg | ORAL_TABLET | ORAL | Status: DC | PRN
  Administered 2024-05-06 – 2024-05-13 (×17): 10 mg via ORAL
  Filled 2024-05-06 (×18): qty 2

## 2024-05-06 MED ORDER — PROPOFOL 10 MG/ML IV BOLUS
INTRAVENOUS | Status: DC | PRN
  Administered 2024-05-06: 50 mg via INTRAVENOUS

## 2024-05-06 MED ORDER — BISACODYL 5 MG PO TBEC
10.0000 mg | DELAYED_RELEASE_TABLET | Freq: Every day | ORAL | Status: DC
  Administered 2024-05-07 – 2024-05-09 (×3): 10 mg via ORAL
  Filled 2024-05-06 (×5): qty 2

## 2024-05-06 MED ORDER — METOPROLOL TARTRATE 25 MG/10 ML ORAL SUSPENSION: 12.5000 mg | Freq: Two times a day (BID) | ORAL | Status: DC

## 2024-05-06 MED ORDER — PLASMA-LYTE A IV SOLN: INTRAVENOUS | Status: DC | PRN

## 2024-05-06 MED ORDER — METOCLOPRAMIDE HCL 5 MG/ML IJ SOLN
10.0000 mg | Freq: Four times a day (QID) | INTRAMUSCULAR | Status: AC
  Administered 2024-05-06 – 2024-05-07 (×6): 10 mg via INTRAVENOUS
  Filled 2024-05-06 (×6): qty 2

## 2024-05-06 MED ORDER — TRAMADOL HCL 50 MG PO TABS
50.0000 mg | ORAL_TABLET | ORAL | Status: DC | PRN
  Administered 2024-05-06 – 2024-05-09 (×8): 100 mg via ORAL
  Administered 2024-05-09: 50 mg via ORAL
  Filled 2024-05-06: qty 1
  Filled 2024-05-06 (×9): qty 2

## 2024-05-06 MED ORDER — INSULIN ASPART 100 UNIT/ML IJ SOLN: 0.0000 [IU] | INTRAMUSCULAR | Status: DC | PRN

## 2024-05-06 MED ORDER — BISACODYL 10 MG RE SUPP: 10.0000 mg | Freq: Every day | RECTAL | Status: DC

## 2024-05-06 MED ORDER — SODIUM CHLORIDE 0.9% FLUSH: 3.0000 mL | INTRAVENOUS | Status: DC | PRN

## 2024-05-06 MED ORDER — ROCURONIUM BROMIDE 10 MG/ML (PF) SYRINGE
PREFILLED_SYRINGE | INTRAVENOUS | Status: DC | PRN
  Administered 2024-05-06: 60 mg via INTRAVENOUS
  Administered 2024-05-06: 40 mg via INTRAVENOUS
  Administered 2024-05-06: 30 mg via INTRAVENOUS

## 2024-05-06 MED ORDER — SODIUM CHLORIDE (PF) 0.9 % IJ SOLN: OROMUCOSAL | Status: DC | PRN

## 2024-05-06 MED ORDER — ASPIRIN 325 MG PO TBEC
325.0000 mg | DELAYED_RELEASE_TABLET | Freq: Every day | ORAL | Status: DC
  Administered 2024-05-07 – 2024-05-13 (×6): 325 mg via ORAL
  Filled 2024-05-06 (×6): qty 1

## 2024-05-06 MED ORDER — FENTANYL CITRATE (PF) 250 MCG/5ML IJ SOLN
INTRAMUSCULAR | Status: DC | PRN
  Administered 2024-05-06: 100 ug via INTRAVENOUS
  Administered 2024-05-06: 50 ug via INTRAVENOUS
  Administered 2024-05-06: 100 ug via INTRAVENOUS
  Administered 2024-05-06: 50 ug via INTRAVENOUS
  Administered 2024-05-06 (×5): 100 ug via INTRAVENOUS
  Administered 2024-05-06: 200 ug via INTRAVENOUS
  Administered 2024-05-06: 50 ug via INTRAVENOUS

## 2024-05-06 MED ORDER — ALBUMIN HUMAN 5 % IV SOLN
250.0000 mL | INTRAVENOUS | Status: DC | PRN
  Administered 2024-05-06 (×3): 12.5 g via INTRAVENOUS
  Filled 2024-05-06 (×2): qty 250

## 2024-05-06 MED ORDER — ALBUMIN HUMAN 5 % IV SOLN: INTRAVENOUS | Status: DC | PRN

## 2024-05-06 MED ORDER — PROPOFOL 10 MG/ML IV BOLUS
INTRAVENOUS | Status: AC
  Filled 2024-05-06: qty 20

## 2024-05-06 MED ORDER — CHLORHEXIDINE GLUCONATE CLOTH 2 % EX PADS
6.0000 | MEDICATED_PAD | Freq: Every day | CUTANEOUS | Status: DC
  Administered 2024-05-06 – 2024-05-09 (×4): 6 via TOPICAL

## 2024-05-06 MED ORDER — ORAL CARE MOUTH RINSE: 15.0000 mL | Freq: Once | OROMUCOSAL | Status: DC

## 2024-05-06 SURGICAL SUPPLY — 67 items
BAG DECANTER FOR FLEXI CONT (MISCELLANEOUS) ×4 IMPLANT
BLADE CLIPPER SURG (BLADE) ×2 IMPLANT
BLADE STERNUM SYSTEM 6 (BLADE) ×2 IMPLANT
BLOWER MISTER CAL-MED (MISCELLANEOUS) ×2 IMPLANT
BNDG ELASTIC 4X5.8 VLCR STR LF (GAUZE/BANDAGES/DRESSINGS) ×2 IMPLANT
BNDG ELASTIC 6INX 5YD STR LF (GAUZE/BANDAGES/DRESSINGS) ×2 IMPLANT
BNDG GAUZE DERMACEA FLUFF 4 (GAUZE/BANDAGES/DRESSINGS) ×2 IMPLANT
CABLE SURGICAL S-101-97-12 (CABLE) IMPLANT
CANISTER SUCTION 3000ML PPV (SUCTIONS) ×2 IMPLANT
CANNULA MC2 2 STG 29/37 NON-V (CANNULA) ×2 IMPLANT
CANNULA NON VENT 20FR 12 (CANNULA) ×2 IMPLANT
CLIP RETRACTION 3.0MM CORONARY (MISCELLANEOUS) ×2 IMPLANT
CLIP TI MEDIUM 24 (CLIP) IMPLANT
CLIP TI WIDE RED SMALL 24 (CLIP) IMPLANT
CONN ST 1/2X1/2 BEN (MISCELLANEOUS) ×2 IMPLANT
DRAIN CHANNEL 19F RND (DRAIN) IMPLANT
DRAPE INCISE IOBAN 66X45 STRL (DRAPES) ×2 IMPLANT
DRAPE SRG 135X102X78XABS (DRAPES) ×2 IMPLANT
DRAPE WARM FLUID 44X44 (DRAPES) ×2 IMPLANT
DRSG AQUACEL AG ADV 3.5X10 (GAUZE/BANDAGES/DRESSINGS) IMPLANT
DRSG COVADERM 4X14 (GAUZE/BANDAGES/DRESSINGS) ×2 IMPLANT
ELECTRODE REM PT RTRN 9FT ADLT (ELECTROSURGICAL) ×4 IMPLANT
FELT TEFLON 1X6 (MISCELLANEOUS) ×4 IMPLANT
GAUZE 4X4 16PLY ~~LOC~~+RFID DBL (SPONGE) ×2 IMPLANT
GAUZE SPONGE 4X4 12PLY STRL (GAUZE/BANDAGES/DRESSINGS) ×4 IMPLANT
GLOVE BIO SURGEON STRL SZ7 (GLOVE) ×4 IMPLANT
GLOVE BIOGEL PI IND STRL 7.5 (GLOVE) ×4 IMPLANT
GOWN STRL REUS W/ TWL LRG LVL3 (GOWN DISPOSABLE) ×8 IMPLANT
GOWN STRL REUS W/ TWL XL LVL3 (GOWN DISPOSABLE) ×4 IMPLANT
GOWN STRL SURGICAL XL XLNG (GOWN DISPOSABLE) ×4 IMPLANT
HEMOSTAT POWDER SURGIFOAM 1G (HEMOSTASIS) ×6 IMPLANT
INSERT SUTURE HOLDER (MISCELLANEOUS) ×2 IMPLANT
KIT BASIN OR (CUSTOM PROCEDURE TRAY) ×2 IMPLANT
KIT SUCTION CATH 14FR (SUCTIONS) ×6 IMPLANT
KIT TURNOVER KIT B (KITS) ×2 IMPLANT
KIT VASOVIEW HEMOPRO 2 VH 4000 (KITS) ×2 IMPLANT
LEAD PACING MYOCARDI (MISCELLANEOUS) ×2 IMPLANT
MARKER GRAFT CORONARY BYPASS (MISCELLANEOUS) ×6 IMPLANT
NS IRRIG 1000ML POUR BTL (IV SOLUTION) ×10 IMPLANT
OFFPUMP STABILIZER SUV (MISCELLANEOUS) ×2 IMPLANT
PACK E OPEN HEART (SUTURE) ×2 IMPLANT
PACK OPEN HEART (CUSTOM PROCEDURE TRAY) ×2 IMPLANT
PAD ARMBOARD POSITIONER FOAM (MISCELLANEOUS) ×4 IMPLANT
PAD ELECT DEFIB RADIOL ZOLL (MISCELLANEOUS) ×2 IMPLANT
PENCIL BUTTON HOLSTER BLD 10FT (ELECTRODE) ×2 IMPLANT
POSITIONER ACROBAT-I OFFPUMP (MISCELLANEOUS) ×2 IMPLANT
POSITIONER HEAD DONUT 9IN (MISCELLANEOUS) ×2 IMPLANT
PUNCH AORTIC ROTATE 4.5MM 8IN (MISCELLANEOUS) IMPLANT
SPONGE T-LAP 18X18 ~~LOC~~+RFID (SPONGE) ×8 IMPLANT
SUPPORT HEART JANKE-BARRON (MISCELLANEOUS) ×2 IMPLANT
SUT BONE WAX W31G (SUTURE) ×2 IMPLANT
SUT MNCRL AB 3-0 PS2 18 (SUTURE) ×4 IMPLANT
SUT PDS AB 1 CTX 36 (SUTURE) ×4 IMPLANT
SUT PROLENE 3 0 SH DA (SUTURE) ×2 IMPLANT
SUT PROLENE 5 0 C 1 36 (SUTURE) ×6 IMPLANT
SUT PROLENE BLUE 7 0 (SUTURE) ×2 IMPLANT
SUT STEEL 6MS V (SUTURE) ×4 IMPLANT
SYSTEM SAHARA CHEST DRAIN ATS (WOUND CARE) ×2 IMPLANT
TAPE CLOTH SURG 4X10 WHT LF (GAUZE/BANDAGES/DRESSINGS) IMPLANT
TAPE PAPER 2X10 WHT MICROPORE (GAUZE/BANDAGES/DRESSINGS) IMPLANT
TAPES RETRACTO (MISCELLANEOUS) ×4 IMPLANT
TOWEL GREEN STERILE (TOWEL DISPOSABLE) ×2 IMPLANT
TOWEL GREEN STERILE FF (TOWEL DISPOSABLE) ×2 IMPLANT
TRAY FOLEY SLVR 16FR TEMP STAT (SET/KITS/TRAYS/PACK) ×2 IMPLANT
TUBING LAP HI FLOW INSUFFLATIO (TUBING) ×2 IMPLANT
UNDERPAD 30X36 HEAVY ABSORB (UNDERPADS AND DIAPERS) ×2 IMPLANT
WATER STERILE IRR 1000ML POUR (IV SOLUTION) ×4 IMPLANT

## 2024-05-06 NOTE — Consult Note (Signed)
 NAME:  Rachel Odonnell, MRN:  [id number], DOB:  [birth year], LOS: 4 ADMISSION DATE:  05/01/2024, CONSULTATION DATE: 05/06/2024 REFERRING MD: Dr. Shyrl, REASON FOR CONSUL.TATION: Postoperative ventilator management  History of Present Illness:  68 year old woman, former smoker, history of CAD (PTCI), with type 2 diabetes, hyperlipidemia, hypertension, paroxysmal atrial fibrillation, ADD with anxiety and depression, OSA.  Admitted with ACS and a non-STEMI.  Repeat cardiac catheterization 05/04/2024 showed severe one-vessel CAD due to in-stent restenosis in the LAD.  Echocardiogram with intact LV function and mild AS.  She went for one-vessel CABG on 05/06/2024.  Admitted to the ICU postoperatively intubated and sedated.   Pump time: 0 Cell saver: 0 EBL: 350 cc UOP: 500 cc  Pertinent  Medical History   Past Medical History:  Diagnosis Date   ADD (attention deficit disorder)    Diabetes mellitus without complication (HCC)    Hypertension     Significant Hospital Events: Including procedures, antibiotic start and stop dates in addition to other pertinent events   9/20 CABG x 1  Interim History / Subjective:  Phenylephrine  15 Precedex  0.7 Insulin  drip 4.8 TXA infusion   Objective    Blood pressure 121/71, pulse 80, temperature 98.3 F (36.8 C), temperature source Axillary, resp. rate 16, height 5' 4 (1.626 m), weight 97.7 kg, SpO2 97%.    Vent Mode: SIMV;PRVC;PSV FiO2 (%):  [50 %] 50 % Set Rate:  [16 bmp] 16 bmp Vt Set:  [540 mL] 540 mL PEEP:  [5 cmH20] 5 cmH20 Pressure Support:  [10 cmH20] 10 cmH20 Plateau Pressure:  [16 cmH20] 16 cmH20   Intake/Output Summary (Last 24 hours) at 05/06/2024 1127 Last data filed at 05/06/2024 1012 Gross per 24 hour  Intake 2079.38 ml  Output 850 ml  Net 1229.38 ml   Filed Weights   05/02/24 1815 05/04/24 1013 05/06/24 0551  Weight: 100.1 kg 98.3 kg 97.7 kg    Examination: General: Overweight woman, sedated, comfortable,  ventilated HENT: ET tube in good position, no secretions Lungs: Clear bilaterally Cardiovascular: Sternal wound dressing clean, paced 80, regular Abdomen: Soft, nondistended with positive bowel sounds Extremities: No edema Neuro: Sedated, grimace with stimulation, does not wake or follow commands yet GU: Foley catheter in place  Resolved problem list   Assessment and Plan   CAD, hyperlipidemia CABGx1 on 9/20 - post-op management per TCTS - CXR/ ABG, CBC, BMET now - Wean phenylephrine  as able to maintain MAP >65 -85. Albumin  boluses prn for low SV. - tele monitoring/ pacing prn (currently paced 80) - mediastinal drains per TCTS - Complete postop prophylactic antibiotics - multimodal pain control per protocol- oxycodone , tramadol , morphine  with bowel regimen -Aspirin  and rosuvastatin  as ordered.  Add Plavix  when okay with TCTS -Metoprolol  to start afternoon 9/20 if blood pressure tolerates - Clinically euvolemic, holding off on diuresis - Monitor electrolytes, replete as needed   Post-operative vent management  - rapid wean per TCTS protocol  - full mechanical vent support - lung protective ventilation 8cc/kg Vt - VAP and PAD bundle in place  - titrate FiO2 to sat goal >92  - maintain peak/plats <30, driving pressures <1  Diabetes mellitus with hyperglycemia, hemoglobin A1c 9.2 Diabetic neuropathy -Home Jardiance  and metformin  held.  Semaglutide  is also on her home med list -Insulin  infusion, wean and transition to subcutaneous regimen as able -Restart home Neurontin  when stable to do so  OSA - Continue nocturnal CPAP once she is extubated  Hypothyroidism - Synthroid  as ordered  Restless leg syndrome -Requip   as ordered  GERD -Pantoprazole  as ordered  ADD, anxiety/depression -Home BuSpar , Provigil , Celexa , Aricept  when able to take p.o.     Labs   CBC: Recent Labs  Lab 05/03/24 0350 05/03/24 0915 05/05/24 0321 05/06/24 0203 05/06/24 0809 05/06/24 0921  05/06/24 0955 05/06/24 1055  WBC 8.1 9.2 9.7 10.2  --   --   --  13.8*  HGB 14.3 14.0 14.2 14.7 13.9 12.6 12.6 12.8  HCT 43.1 42.5 42.5 44.3 41.0 37.0 37.0 38.0  MCV 92.1 92.2 91.6 91.3  --   --   --  91.8  PLT 265 266 249 263  --   --   --  275    Basic Metabolic Panel: Recent Labs  Lab 05/01/24 2346 05/03/24 0350 05/04/24 0328 05/05/24 0321 05/06/24 0203 05/06/24 0809 05/06/24 0921 05/06/24 0955  NA 133* 137 138 134* 137 138 136 136  K 3.8 3.8 4.0 4.4 3.8 3.9 4.1 3.9  CL 97* 102 99 101 101 101 102 101  CO2 22 27 24 24 24   --   --   --   GLUCOSE 380* 150* 234* 269* 178* 170* 185* 189*  BUN 28* 21 22 23 19 21 23 21   CREATININE 1.25* 1.54* 1.21* 1.03* 1.04* 0.90 0.90 1.00  CALCIUM  10.2 9.7 9.7 10.1 9.8  --   --   --    GFR: Estimated Creatinine Clearance: 61.1 mL/min (by C-G formula based on SCr of 1 mg/dL). Recent Labs  Lab 05/03/24 0915 05/05/24 0321 05/06/24 0203 05/06/24 1055  WBC 9.2 9.7 10.2 13.8*    Liver Function Tests: No results for input(s): AST, ALT, ALKPHOS, BILITOT, PROT, ALBUMIN  in the last 168 hours. No results for input(s): LIPASE, AMYLASE in the last 168 hours. No results for input(s): AMMONIA in the last 168 hours.  ABG    Component Value Date/Time   PHART 7.375 04/16/2023 0906   PCO2ART 41.9 04/16/2023 0906   PO2ART 90 04/16/2023 0906   HCO3 27.9 09/17/2023 1153   TCO2 25 05/06/2024 0955   ACIDBASEDEF 1.0 04/16/2023 0906   O2SAT 39.9 09/17/2023 1153     Coagulation Profile: Recent Labs  Lab 05/02/24 1524 05/03/24 0350 05/03/24 0915 05/04/24 0847 05/05/24 0321  INR 2.9* 2.8* 2.7* 1.9* 1.5*    Cardiac Enzymes: No results for input(s): CKTOTAL, CKMB, CKMBINDEX, TROPONINI in the last 168 hours.  HbA1C: Hemoglobin A1C  Date/Time Value Ref Range Status  09/25/2021 12:00 AM 10.3  Final   HbA1c, POC (controlled diabetic range)  Date/Time Value Ref Range Status  07/14/2023 10:20 AM 10.6 (A) 0.0 - 7.0 %  Final  03/04/2023 01:09 PM 10.2 (A) 0.0 - 7.0 % Final   Hgb A1c MFr Bld  Date/Time Value Ref Range Status  05/05/2024 03:21 AM 9.2 (H) 4.8 - 5.6 % Final    Comment:    (NOTE)         Prediabetes: 5.7 - 6.4         Diabetes: >6.4         Glycemic control for adults with diabetes: <7.0   04/16/2023 02:53 PM 11.4 (H) 4.8 - 5.6 % Final    Comment:    (NOTE)         Prediabetes: 5.7 - 6.4         Diabetes: >6.4         Glycemic control for adults with diabetes: <7.0     CBG: Recent Labs  Lab 05/05/24 1123 05/05/24 1729 05/05/24 2147  05/06/24 0545 05/06/24 1105  GLUCAP 356* 219* 207* 178* 155*    Review of Systems:   Unable to obtain  Past Medical History:  She,  has a past medical history of ADD (attention deficit disorder), Diabetes mellitus without complication (HCC), and Hypertension.   Surgical History:   Past Surgical History:  Procedure Laterality Date   ABDOMINAL HYSTERECTOMY     COLONOSCOPY  02/2022   CORONARY PRESSURE/FFR STUDY N/A 04/16/2023   Procedure: CORONARY PRESSURE/FFR STUDY;  Surgeon: Swaziland, Peter M, MD;  Location: [facility] INVASIVE CV LAB;  Service: Cardiovascular;  Laterality: N/A;   CORONARY STENT INTERVENTION N/A 04/16/2023   Procedure: CORONARY STENT INTERVENTION;  Surgeon: Swaziland, Peter M, MD;  Location: [facility] INVASIVE CV LAB;  Service: Cardiovascular;  Laterality: N/A;   CORONARY ULTRASOUND/IVUS N/A 04/16/2023   Procedure: Coronary Ultrasound/IVUS;  Surgeon: Swaziland, Peter M, MD;  Location: [facility] INVASIVE CV LAB;  Service: Cardiovascular;  Laterality: N/A;   EXCISIONAL HEMORRHOIDECTOMY     INCONTINENCE SURGERY     LEFT HEART CATH AND CORONARY ANGIOGRAPHY N/A 05/04/2024   Procedure: LEFT HEART CATH AND CORONARY ANGIOGRAPHY;  Surgeon: Darron Deatrice LABOR, MD;  Location: [facility] INVASIVE CV LAB;  Service: Cardiovascular;  Laterality: N/A;   RIGHT/LEFT HEART CATH AND CORONARY ANGIOGRAPHY N/A 06/13/2021   Procedure: RIGHT/LEFT HEART CATH AND CORONARY ANGIOGRAPHY;   Surgeon: Swaziland, Peter M, MD;  Location: [facility] INVASIVE CV LAB;  Service: Cardiovascular;  Laterality: N/A;   RIGHT/LEFT HEART CATH AND CORONARY ANGIOGRAPHY N/A 04/16/2023   Procedure: RIGHT/LEFT HEART CATH AND CORONARY ANGIOGRAPHY;  Surgeon: Swaziland, Peter M, MD;  Location: [facility] INVASIVE CV LAB;  Service: Cardiovascular;  Laterality: N/A;     Social History:   reports that she quit smoking about 29 years ago. Her smoking use included cigarettes. She has never used smokeless tobacco. She reports that she does not drink alcohol  and does not use drugs.   Family History:  Her family history includes COPD in her father; Cancer in her mother; Congestive Heart Failure in her father; Diabetes in her father; Emphysema in her father; Heart Problems in her father; Heart block in her father.   Allergies Allergies  Allergen Reactions   Azithromycin Hives   Codeine Nausea Only   Atomoxetine Nausea And Vomiting   Atorvastatin Other (See Comments)    Restless legs   Lisinopril Nausea And Vomiting     Home Medications  Prior to Admission medications   Medication Sig Start Date End Date Taking? Authorizing Provider  busPIRone  (BUSPAR ) 5 MG tablet Take 5 mg by mouth at bedtime. 01/27/22  Yes [provider]  Cholecalciferol  (VITAMIN D3) 125 MCG (5000 UT) CAPS Take 1 capsule (5,000 Units total) by mouth daily. 12/16/21  Yes Nida, Gebreselassie W, MD  citalopram  (CELEXA ) 20 MG tablet Take 1 tablet (20 mg total) by mouth every evening. 06/09/23  Yes Miriam Norris, NP  clopidogrel  (PLAVIX ) 75 MG tablet TAKE 1 TABLET BY MOUTH DAILY 11/05/23  Yes Miriam Norris, NP  cyclobenzaprine (FLEXERIL) 10 MG tablet Take 10 mg by mouth at bedtime. 02/29/24  Yes [provider]  donepezil  (ARICEPT ) 5 MG tablet Take 5 mg by mouth every evening. 08/06/22  Yes [provider]  empagliflozin  (JARDIANCE ) 25 MG TABS tablet Take 1 tablet (25 mg total) by mouth daily. 03/23/24  Yes Nida, Ethelle ORN, MD   estradiol  (ESTRACE ) 1 MG tablet Take 1 mg by mouth daily. 02/07/24  Yes [provider]  fenofibrate  (TRICOR ) 145 MG tablet Take 145  mg by mouth daily. 02/07/24  Yes [provider]  gabapentin  (NEURONTIN ) 100 MG capsule Take 1 capsule (100 mg total) by mouth at bedtime. 04/17/23  Yes Dunn, Dayna N, PA-C  indapamide  (LOZOL ) 2.5 MG tablet Take 1 tablet (2.5 mg total) by mouth daily. 02/21/24  Yes McKenzie, Belvie CROME, MD  insulin  degludec (TRESIBA  FLEXTOUCH) 200 UNIT/ML FlexTouch Pen Inject 110 Units into the skin at bedtime. 03/23/24  Yes Nida, Gebreselassie W, MD  insulin  lispro (HUMALOG  KWIKPEN) 100 UNIT/ML KwikPen Inject 16-22 Units into the skin 3 (three) times daily before meals. 03/27/24  Yes Nida, Gebreselassie W, MD  isosorbide  mononitrate (IMDUR ) 60 MG 24 hr tablet Take 1.5 tablets (90 mg total) by mouth daily. Can take an additional half tab as needed for chest pain 05/25/23  Yes Miriam Norris, NP  levocetirizine (XYZAL ) 5 MG tablet Take 5 mg by mouth every evening.   Yes [provider]  levothyroxine  (SYNTHROID ) 50 MCG tablet TAKE 1 TABLET BY MOUTH DAILY  BEFORE BREAKFAST 11/11/23  Yes Nida, Gebreselassie W, MD  Magnesium  Oxide (MAGNESIUM  OXIDE 400) 240 MG PACK Take 400 mg by mouth daily. 07/19/23  Yes Miriam Norris, NP  metFORMIN  (GLUCOPHAGE ) 500 MG tablet TAKE 1 TABLET BY MOUTH DAILY  WITH BREAKFAST Patient taking differently: Take 500 mg by mouth every evening. 11/11/23  Yes Nida, Gebreselassie W, MD  modafinil  (PROVIGIL ) 100 MG tablet Take 1 tablet (100 mg total) by mouth daily. 03/02/24  Yes Jude Harden GAILS, MD  montelukast  (SINGULAIR ) 10 MG tablet Take 10 mg by mouth at bedtime.   Yes [provider]  Multiple Vitamin (MULTIVITAMIN ADULT PO) Take 1 tablet by mouth daily.   Yes [provider]  nitroGLYCERIN  (NITROSTAT ) 0.4 MG SL tablet Place 1 tablet (0.4 mg total) under the tongue every 5 (five) minutes as needed for chest pain. 05/01/24  Yes Miriam Norris, NP  OVER THE COUNTER MEDICATION Take 2 capsules by mouth daily. Omega XL   Yes [provider]  pantoprazole  (PROTONIX ) 40 MG tablet TAKE 2 TABLETS EVERY DAY 08/19/23  Yes Miriam Norris, NP  rOPINIRole  (REQUIP ) 2 MG tablet Take 2 mg by mouth at bedtime. 06/22/23  Yes [provider]  rosuvastatin  (CRESTOR ) 40 MG tablet Take 1 tablet (40 mg total) by mouth daily. 04/17/23  Yes Dunn, Dayna N, PA-C  Semaglutide ,0.25 or 0.5MG /DOS, (OZEMPIC , 0.25 OR 0.5 MG/DOSE,) 2 MG/1.5ML SOPN Inject 0.5 mg into the skin once a week.   Yes [provider]  Vibegron  (GEMTESA ) 75 MG TABS Take 1 tablet (75 mg total) by mouth daily. 02/01/24  Yes Larocco, [name] C, FNP  warfarin (COUMADIN ) 5 MG tablet TAKE ONE-HALF TABLET BY MOUTH  DAILY EXCEPT 1 TABLET BY MOUTH  ON SUNDAYS, TUESDAYS, THURSDAYS, SATURDAYS OR AS DIRECTED Patient taking differently: Take 5 mg by mouth. Take one tablet every day except on Monday and Thursday take 1/2 tablet. 05/01/24  Yes BranchDorn FALCON, MD  Accu-Chek Softclix Lancets lancets Use as instructed to test blood glucose four times daily 06/09/22   Nida, Gebreselassie W, MD  Continuous Glucose Receiver (FREESTYLE LIBRE 2 READER) DEVI USE TO CHECK GLUCOSE AS DIRECTED 12/04/22   Nida, Gebreselassie W, MD  Continuous Glucose Sensor (FREESTYLE LIBRE 2 SENSOR) MISC CHANGE SENSOR EVERY 14 DAYS USE  TO CHECK BLOOD GLUCOSE  CONTINUOUSLY 11/01/23   Lenis Ethelle ORN, MD  glucose blood test strip 1 each by Other route as needed. Use as instructed to test blood glucose four  times daily 06/09/22   Nida, Gebreselassie W, MD  Insulin  Pen Needle (B-D ULTRAFINE III SHORT PEN) 31G X 8 MM MISC 1 each by Does not apply route as directed. 01/06/22   Nida, Gebreselassie W, MD     Critical care time: 35 min     Lamar Chris, MD, PhD 05/06/2024, 11:27 AM [facility] [phone] or if no answer before 7:00PM call [phone] For any issues after 7:00PM  please call eLink [phone]

## 2024-05-06 NOTE — Progress Notes (Signed)
 Patient Began rapid vent wean protocol around 1300. Patient was appropriate throughout weaning protocol. ABG obtained at 1349 with results as follows: pH 7.25/pCO2 51.9/PO2 68/bicarb 23. Byrum MD made aware and ABG recheck in 1h per MD. Upon ABG recheck RN notified Byrum MD who was at the bedside of ABG at 1430 with results of pH 7.295/PCO2 45.8/PaO2 74/Bicarb 22. MD verified to proceed w/ extubation (see orders). Patient extubated right before 1500. Post extubation gas patient remained acidotic pH 7.24/CO2 54.2/PaO2 80 Bicarb 22.8. 1 amp of bicarb and bipap orders placed. ABG 1h after intervention 1731 remains acidotic pH 7.281/CO2 51.2/PaO2 116, Bicarb 23.7. Byrum MD made aware and no new orders placed at this time.

## 2024-05-06 NOTE — Progress Notes (Signed)
Rapid wean protocol started at this time 

## 2024-05-06 NOTE — Progress Notes (Signed)
  Echocardiogram 2D Echocardiogram has been performed.  Rachel Odonnell 05/06/2024, 8:52 AM

## 2024-05-06 NOTE — Transfer of Care (Signed)
 Immediate Anesthesia Transfer of Care Note  Patient: Rachel Odonnell  Procedure(s) Performed: OFF PUMP CORONARY ARTERY BYPASS GRAFTING (CABG) TIMES ONE USING LEFT INTERNAL MAMMARY ARTERY (Chest) ECHOCARDIOGRAM, TRANSESOPHAGEAL, INTRAOPERATIVE  Patient Location: SICU  Anesthesia Type:General  Level of Consciousness: Patient remains intubated per anesthesia plan  Airway & Oxygen Therapy: Patient remains intubated per anesthesia plan  Post-op Assessment: Report given to RN and Post -op Vital signs reviewed and stable  Post vital signs: Reviewed and stable  Last Vitals:  Vitals Value Taken Time  BP    Temp    Pulse 80 05/06/24 11:04  Resp 15 05/06/24 11:04  SpO2 96 % 05/06/24 11:04  Vitals shown include unfiled device data.  Last Pain:  Vitals:   05/06/24 0341  TempSrc: Axillary  PainSc:       Patients Stated Pain Goal: 0 (05/04/24 1857)  Complications: No notable events documented.

## 2024-05-06 NOTE — Op Note (Signed)
 [address]       [place] [address]             [phone]                                         05/06/2024    Patient:  Rachel Odonnell Pizza Pre-Op Dx: CAD HTN HLP DM   Post-op Dx:  same Procedure: Off pump CABG X 1.  LIMA LAD     Surgeon and Role:      * Nicolemarie Wooley, Linnie KIDD, MD - Primary    DEWAINE MICAEL Cera, PA-C - assisting  An experienced assistant was required given the complexity of this surgery and the standard of surgical care. The assistant was needed for exposure, dissection, suctioning, retraction of delicate tissues and sutures, instrument exchange and for overall help during this procedure.     Anesthesia  general EBL:  250ml Blood Administration: none  Drains: 19 F blake drain: L, mediastinal  Wires: V Counts: correct   Indications: We are asked to see this 68 year old female in CT surgical consultation for consideration of CABG. The patient has multiple cardiac risk factors including type 2 diabetes mellitus, obstructive sleep apnea, dyslipidemia and hypertension. She also has a history of paroxysmal atrial fibrillation. She is a former smoker having quit in 1996. Patient presented to the emergency department on 05/01/2024 with complaints of chest pain. He has a known history of CAD. He underwent DES of the LAD in August 2024. She had an abrupt occlusion of a diagonal that was jailed by the stent had successful POBA. She has ruled in for non-STEMI. Peak measured high-sensitivity troponin I was 415. Recently she has noted worsening exertional chest pain that has been associated with shortness of breath, fatigue and dizziness as well as nausea and some numbness into the left forearm. The symptoms are similar to her previous angina prior to stenting. She is admitted for further evaluation and treatment including cardiology consultation. She has continued to have some episodes of chest pain. She underwent repeat cardiac catheterization 05/04/2024 where she  was found to have severe one-vessel coronary artery disease due to in-stent restenosis in the proximal portion of the LAD stent. She does have a 60% first diagonal lesion well is a 60% first marginal stenosis and a 35% mid RCA lesion. Echocardiogram done 05/02/2024 shows LVEF by estimation to be in the 70 to 75% range. She was noted to have left ventricular hyperdynamic function. She has mild aortic valve stenosis.   Findings: Good LIMA, good LAd  Operative Technique: After the risks, benefits and alternatives were thoroughly discussed, the patient was brought to the operative theatre.  All invasive lines were placed in pre-op holding.  Anesthesia was induced, and the patient was prepped and draped in normal sterile fashion.  An appropriate surgical pause was performed, and pre-operative antibiotics were dosed accordingly.  We began with the chest for the sternotomy.  In regards to the sternotomy, this was carried down with bovie cautery, and the sternum was divided with a reciprocating saw.  Meticulous hemostasis was obtained.  The left internal thoracic artery was exposed and harvested in in pedicled fashion.  The patient was systemically heparinized, and the artery was divided distally, and placed in a papaverine sponge.    The sternal elevator was removed, and a retractor was placed.  The pericardium was  divided in the midline and fashioned into a cradle with pericardial stitches.   Next, we exposed a good target on the LAD, and fashioned an end to side anastomosis between it and the LITA.  Meticulous hemostasis was obtained.    The heparin  was reversed.  Chest tubes and wires were placed, and the sternum was re-approximated with with sternal wires.  The soft tissue and skin were re-approximated wth absorbable suture.    The patient tolerated the procedure without any immediate complications, and was transferred to the ICU in guarded condition.  Pax Reasoner MALVA Rayas

## 2024-05-06 NOTE — Progress Notes (Signed)
 Patient with no activity on evening shift due to unstable angina and activity as a precipitating factor earlier in day. Started on nitroglycerin  gtt today. Patient scheduled for CABG in a.m.

## 2024-05-06 NOTE — Anesthesia Procedure Notes (Signed)
 Central Venous Catheter Insertion Performed by: Dorethea Cordella SQUIBB, DO, anesthesiologist Start/End9/20/2025 7:35 AM, 05/06/2024 7:50 AM Patient location: OR. Preanesthetic checklist: patient identified, IV checked, site marked, risks and benefits discussed, surgical consent, monitors and equipment checked, pre-op evaluation, timeout performed and anesthesia consent Position: Trendelenburg Patient sedated Hand hygiene performed  and maximum sterile barriers used  Catheter size: 8.5 Fr Central line was placed.Sheath introducer Procedure performed using ultrasound guided technique. Ultrasound Notes:anatomy identified, needle tip was noted to be adjacent to the nerve/plexus identified, no ultrasound evidence of intravascular and/or intraneural injection and image(s) printed for medical record Attempts: 1 Following insertion, line sutured and dressing applied. Post procedure assessment: blood return through all ports, free fluid flow and no air  Patient tolerated the procedure well with no immediate complications.

## 2024-05-06 NOTE — Progress Notes (Signed)
 Chart review: Overnight hemodynamically stable.  Labs this morning stable electrolytes. Due to ongoing chest pai started on nitroglycerin  drip 9/19 evening, remains on heparin , along with cardiac meds. Due to ongoing chest pain cardio and TCTS planned for CABG this morning and she is in OR currently. Patient will be transferred under cardiothoracic service> plan of care will be continued as per cardiology and TCTS service.

## 2024-05-06 NOTE — Anesthesia Procedure Notes (Signed)
 Arterial Line Insertion Start/End9/20/2025 7:17 AM Performed by: Cena Epps, CRNA  Patient location: Pre-op. Preanesthetic checklist: patient identified, IV checked, site marked, risks and benefits discussed, surgical consent, monitors and equipment checked, pre-op evaluation, timeout performed and anesthesia consent Lidocaine  1% used for infiltration Left, radial was placed Catheter size: 20 G Hand hygiene performed  and maximum sterile barriers used   Attempts: 1 Procedure performed without using ultrasound guided technique. Following insertion, dressing applied. Post procedure assessment: normal and unchanged

## 2024-05-06 NOTE — Brief Op Note (Signed)
 05/01/2024 - 05/06/2024  9:48 AM  PATIENT:  Rachel Odonnell  68 y.o. female  PRE-OPERATIVE DIAGNOSIS:  CORONARY ARTERY DISEASE  POST-OPERATIVE DIAGNOSIS:  CORONARY ARTERY DISEASE  PROCEDURE:  Procedure(s): OFF PUMP CORONARY ARTERY BYPASS GRAFTING (CABG) TIMES ONE USING LEFT INTERNAL MAMMARY ARTERY (N/A) ECHOCARDIOGRAM, TRANSESOPHAGEAL, INTRAOPERATIVE (N/A) LIMA-LAD  SURGEON:  Surgeons and Role:    * Lightfoot, Linnie KIDD, MD - Primary  PHYSICIAN ASSISTANT: Ygnacio Fecteau PA-C  ASSISTANTS: MALLORY HARDY RNFA   ANESTHESIA:   general  EBL:  {None/Minimal: 21241}   BLOOD ADMINISTERED:none  DRAINS: LEFT PLEURAL AND MEDIASTINAL CHEST TUBES   LOCAL MEDICATIONS USED:  NONE  SPECIMEN:  No Specimen  DISPOSITION OF SPECIMEN:  N/A  COUNTS:  YES  TOURNIQUET:  * No tourniquets in log *  DICTATION: .Dragon Dictation  PLAN OF CARE: Admit to inpatient   PATIENT DISPOSITION:  ICU - intubated and hemodynamically stable.   Delay start of Pharmacological VTE agent (>24hrs) due to surgical blood loss or risk of bleeding: yes  COMPLICATIONS: NO KNOWN

## 2024-05-06 NOTE — Progress Notes (Signed)
 Placed back on support at this time due to ABG results. MD alerted

## 2024-05-06 NOTE — Progress Notes (Signed)
 Report given to OR RN at [phone].

## 2024-05-06 NOTE — Procedures (Signed)
 Extubation Procedure Note  Patient Details:   Name: Rachel Odonnell DOB: Apr 28, 1956 MRN: [id number]   Airway Documentation:    Vent end date: 05/06/24 Vent end time: 1455   Evaluation  O2 sats: stable throughout Complications: No apparent complications Patient did tolerate procedure well. Bilateral Breath Sounds: Clear, Diminished   Yes, pt able to cough to clear secretions and hoarsely vocalize name. Pt placed on 3L humidified nasal cannula and is tolerating well at this time. Pt positive for cuff leak prior to extubation with NIF -40 and VC of 526  Lillyian Heidt E 05/06/2024, 3:01 PM

## 2024-05-06 NOTE — Anesthesia Procedure Notes (Signed)
 Procedure Name: Intubation Date/Time: 05/06/2024 8:02 AM  Performed by: Cena Epps, CRNAPre-anesthesia Checklist: Patient identified, Emergency Drugs available, Suction available and Patient being monitored Patient Re-evaluated:Patient Re-evaluated prior to induction Oxygen Delivery Method: Circle System Utilized Preoxygenation: Pre-oxygenation with 100% oxygen Induction Type: IV induction Ventilation: Mask ventilation without difficulty Laryngoscope Size: Mac, 3 and Glidescope Grade View: Grade I Tube type: Oral Tube size: 8.0 mm Number of attempts: 1 (DLx1, grade 3 view. use glidescope, grade 1 view) Airway Equipment and Method: Stylet and Oral airway Placement Confirmation: ETT inserted through vocal cords under direct vision, positive ETCO2 and breath sounds checked- equal and bilateral Secured at: 22 cm Tube secured with: Tape Dental Injury: Teeth and Oropharynx as per pre-operative assessment

## 2024-05-06 NOTE — Anesthesia Postprocedure Evaluation (Signed)
 Anesthesia Post Note  Patient: Rachel Odonnell  Procedure(s) Performed: OFF PUMP CORONARY ARTERY BYPASS GRAFTING (CABG) TIMES ONE USING LEFT INTERNAL MAMMARY ARTERY (Chest) ECHOCARDIOGRAM, TRANSESOPHAGEAL, INTRAOPERATIVE     Patient location during evaluation: SICU Anesthesia Type: General Level of consciousness: sedated Pain management: pain level controlled Vital Signs Assessment: post-procedure vital signs reviewed and stable Respiratory status: patient remains intubated per anesthesia plan Cardiovascular status: stable Postop Assessment: no apparent nausea or vomiting Anesthetic complications: no   No notable events documented.  Last Vitals:  Vitals:   05/06/24 2100 05/06/24 2115  BP:    Pulse: 86 85  Resp: 15 16  Temp:    SpO2: 98% 97%    Last Pain:  Vitals:   05/06/24 2024  TempSrc:   PainSc: 10-Worst pain ever                 Cordella SQUIBB Aamira Bischoff

## 2024-05-06 NOTE — Anesthesia Preprocedure Evaluation (Addendum)
 Anesthesia Evaluation  Patient identified by MRN, date of birth, ID band Patient awake    Reviewed: Allergy & Precautions, NPO status , Patient's Chart, lab work & pertinent test results  Airway Mallampati: II  TM Distance: >3 FB Neck ROM: Full    Dental no notable dental hx.    Pulmonary sleep apnea , COPD, former smoker   Pulmonary exam normal        Cardiovascular hypertension, Pt. on medications + CAD and + Past MI   Rhythm:Regular Rate:Normal  ECHO:  1. Left ventricular ejection fraction, by estimation, is 70 to 75%. The  left ventricle has hyperdynamic function. The left ventricle has no  regional wall motion abnormalities. There is mild left ventricular  hypertrophy. Left ventricular diastolic  parameters were normal.   2. Right ventricular systolic function is normal. The right ventricular  size is normal. Tricuspid regurgitation signal is inadequate for assessing  PA pressure.   3. The mitral valve is normal in structure. Trivial mitral valve  regurgitation. No evidence of mitral stenosis.   4. The aortic valve is tricuspid. There is mild calcification of the  aortic valve. There is mild thickening of the aortic valve. Aortic valve  regurgitation is not visualized. Mild aortic valve stenosis. Aortic valve  area, by VTI measures 1.65 cm.  Aortic valve mean gradient measures 14.0 mmHg.   5. The inferior vena cava is normal in size with greater than 50%  respiratory variability, suggesting right atrial pressure of 3 mmHg.   Cath: 1.  Severe one-vessel coronary artery disease due to in-stent restenosis in the proximal portion of the LAD stent that was placed last year.   Stable left circumflex disease. 2.  Left ventricular angiography was not performed.  EF was normal by echo. 3.  Mild gradient across the aortic valve. 4.  Severe right radial artery spasm.     Neuro/Psych  Headaches  Anxiety Depression        GI/Hepatic Neg liver ROS,GERD  Medicated,,  Endo/Other  diabetes, Type 2, Insulin  DependentHypothyroidism    Renal/GU   negative genitourinary   Musculoskeletal negative musculoskeletal ROS (+)    Abdominal Normal abdominal exam  (+)   Peds  Hematology Lab Results      Component                Value               Date                      WBC                      10.2                05/06/2024                HGB                      14.7                05/06/2024                HCT                      44.3                05/06/2024  MCV                      91.3                05/06/2024                PLT                      263                 05/06/2024             Lab Results      Component                Value               Date                      NA                       137                 05/06/2024                K                        3.8                 05/06/2024                CO2                      24                  05/06/2024                GLUCOSE                  178 (H)             05/06/2024                BUN                      19                  05/06/2024                CREATININE               1.04 (H)            05/06/2024                CALCIUM                   9.8                 05/06/2024                EGFR                     60.3                03/17/2024                GFRNONAA  59 (L)              05/06/2024              Anesthesia Other Findings   Reproductive/Obstetrics                              Anesthesia Physical Anesthesia Plan  ASA: 4  Anesthesia Plan: General   Post-op Pain Management:    Induction: Intravenous  PONV Risk Score and Plan: 3 and Treatment may vary due to age or medical condition  Airway Management Planned: Mask and Oral ETT  Additional Equipment: Arterial line, TEE, 3D TEE, Ultrasound Guidance Line Placement and CVP  Intra-op Plan:    Post-operative Plan: Post-operative intubation/ventilation  Informed Consent:   Plan Discussed with:   Anesthesia Plan Comments:          Anesthesia Quick Evaluation

## 2024-05-06 NOTE — Hospital Course (Signed)
  Referring: No ref. provider found Primary Care: Skillman, Katherine E, PA-C Primary Cardiologist:Samuel Debera, MD    History of Present Illness: At time of CT surgical consultation     we are asked to see this 68 year old female in CT surgical consultation for consideration of CABG.  The patient has multiple cardiac risk factors including type 2 diabetes mellitus, obstructive sleep apnea, dyslipidemia and hypertension.  She also has a history of paroxysmal atrial fibrillation.  She is a former smoker having quit in 1996.  Patient presented to the emergency department on 05/01/2024 with complaints of chest pain.  He has a known history of CAD.  He underwent DES of the LAD in August 2024.  She had an abrupt occlusion of a diagonal that was jailed by the stent had successful POBA.  She has ruled in for non-STEMI.  Peak measured high-sensitivity troponin I was 415.  Recently she has noted worsening exertional chest pain that has been associated with shortness of breath, fatigue and dizziness as well as nausea and some numbness into the left forearm.  The symptoms are similar to her previous angina prior to stenting.  She is admitted for further evaluation and treatment including cardiology consultation.  She has continued to have some episodes of chest pain.  She underwent repeat cardiac catheterization 05/04/2024 where she was found to have severe one-vessel coronary artery disease due to in-stent restenosis in the proximal portion of the LAD stent.  She does have a 60% first diagonal lesion well is a 60% first marginal stenosis and a 35% mid RCA lesion.  Echocardiogram done 05/02/2024 shows LVEF by estimation to be in the 70 to 75% range.  She was noted to have left ventricular hyperdynamic function.  She has mild aortic valve stenosis.   Hospital course:  Following medical stabilization and full diagnostic evaluation she was felt to be a candidate to proceed with CABG.  On 05/06/2024 she was taken the  operating room and underwent off-pump CABG x 1.  She tolerated the procedure well and was taken to the surgical intensive care unit in stable condition.  Postoperative hospital course: Patient was extubated without difficulty using standard post cardiac surgical protocols.  She initially did require some neo for blood pressure assistance.  She has remained neurologically intact.  Her creatinine is noted to be up slightly and is being monitored closely.  She did receive a Lasix  challenge on postop day #2 at which time creatinine was 1.47.  All routine lines, monitors and drainage devices have been discontinued in the standard fashion.

## 2024-05-06 NOTE — Progress Notes (Signed)
 At 2319 cardiac rhythm to atrial fibrillation with HR in 70s. History of PAF. Patient resting comfortably with CPAP applied. No other new symptoms. Patient scheduled for CABG in a.m.   Dr. Duffy paged and notified of a fib rhythm at 0006.

## 2024-05-07 ENCOUNTER — Inpatient Hospital Stay (HOSPITAL_COMMUNITY)

## 2024-05-07 DIAGNOSIS — J449 Chronic obstructive pulmonary disease, unspecified: Secondary | ICD-10-CM

## 2024-05-07 DIAGNOSIS — E1165 Type 2 diabetes mellitus with hyperglycemia: Secondary | ICD-10-CM | POA: Diagnosis not present

## 2024-05-07 DIAGNOSIS — G4733 Obstructive sleep apnea (adult) (pediatric): Secondary | ICD-10-CM | POA: Diagnosis not present

## 2024-05-07 DIAGNOSIS — Z951 Presence of aortocoronary bypass graft: Secondary | ICD-10-CM

## 2024-05-07 DIAGNOSIS — R509 Fever, unspecified: Secondary | ICD-10-CM

## 2024-05-07 DIAGNOSIS — K219 Gastro-esophageal reflux disease without esophagitis: Secondary | ICD-10-CM | POA: Diagnosis not present

## 2024-05-07 LAB — BASIC METABOLIC PANEL WITH GFR
Anion gap: 11 (ref 5–15)
Anion gap: 13 (ref 5–15)
BUN: 20 mg/dL (ref 8–23)
BUN: 23 mg/dL (ref 8–23)
CO2: 24 mmol/L (ref 22–32)
CO2: 19 mmol/L — ABNORMAL LOW (ref 22–32)
Calcium: 9.1 mg/dL (ref 8.9–10.3)
Calcium: 9.5 mg/dL (ref 8.9–10.3)
Chloride: 100 mmol/L (ref 98–111)
Chloride: 98 mmol/L (ref 98–111)
Creatinine, Ser: 1.25 mg/dL — ABNORMAL HIGH (ref 0.44–1.00)
Creatinine, Ser: 1.45 mg/dL — ABNORMAL HIGH (ref 0.44–1.00)
GFR, Estimated: 47 mL/min — ABNORMAL LOW (ref >60)
GFR, Estimated: 39 mL/min — ABNORMAL LOW (ref >60)
Glucose, Bld: 162 mg/dL — ABNORMAL HIGH (ref 70–99)
Glucose, Bld: 271 mg/dL — ABNORMAL HIGH (ref 70–99)
Potassium: 4.4 mmol/L (ref 3.5–5.1)
Potassium: 4.8 mmol/L (ref 3.5–5.1)
Sodium: 135 mmol/L (ref 135–145)
Sodium: 130 mmol/L — ABNORMAL LOW (ref 135–145)

## 2024-05-07 LAB — POCT I-STAT 7, (LYTES, BLD GAS, ICA,H+H)
Acid-Base Excess: 0 mmol/L (ref 0.0–2.0)
Bicarbonate: 25.9 mmol/L (ref 20.0–28.0)
Calcium, Ion: 1.29 mmol/L (ref 1.15–1.40)
HCT: 34 % — ABNORMAL LOW (ref 36.0–46.0)
Hemoglobin: 11.6 g/dL — ABNORMAL LOW (ref 12.0–15.0)
O2 Saturation: 97 %
Patient temperature: 99
Potassium: 4.7 mmol/L (ref 3.5–5.1)
Sodium: 137 mmol/L (ref 135–145)
TCO2: 27 mmol/L (ref 22–32)
pCO2 arterial: 47.3 mmHg (ref 32–48)
pH, Arterial: 7.347 — ABNORMAL LOW (ref 7.35–7.45)
pO2, Arterial: 93 mmHg (ref 83–108)

## 2024-05-07 LAB — CBC
HCT: 34.8 % — ABNORMAL LOW (ref 36.0–46.0)
HCT: 34.5 % — ABNORMAL LOW (ref 36.0–46.0)
Hemoglobin: 11.3 g/dL — ABNORMAL LOW (ref 12.0–15.0)
Hemoglobin: 11 g/dL — ABNORMAL LOW (ref 12.0–15.0)
MCH: 31.1 pg (ref 26.0–34.0)
MCH: 30.8 pg (ref 26.0–34.0)
MCHC: 32.5 g/dL (ref 30.0–36.0)
MCHC: 31.9 g/dL (ref 30.0–36.0)
MCV: 95.9 fL (ref 80.0–100.0)
MCV: 96.6 fL (ref 80.0–100.0)
Platelets: 216 K/uL (ref 150–400)
Platelets: 241 K/uL (ref 150–400)
RBC: 3.63 MIL/uL — ABNORMAL LOW (ref 3.87–5.11)
RBC: 3.57 MIL/uL — ABNORMAL LOW (ref 3.87–5.11)
RDW: 13.6 % (ref 11.5–15.5)
RDW: 13.7 % (ref 11.5–15.5)
WBC: 12.9 K/uL — ABNORMAL HIGH (ref 4.0–10.5)
WBC: 17.3 K/uL — ABNORMAL HIGH (ref 4.0–10.5)
nRBC: 0 % (ref 0.0–0.2)
nRBC: 0 % (ref 0.0–0.2)

## 2024-05-07 LAB — GLUCOSE, CAPILLARY
Glucose-Capillary: 128 mg/dL — ABNORMAL HIGH (ref 70–99)
Glucose-Capillary: 116 mg/dL — ABNORMAL HIGH (ref 70–99)
Glucose-Capillary: 127 mg/dL — ABNORMAL HIGH (ref 70–99)
Glucose-Capillary: 179 mg/dL — ABNORMAL HIGH (ref 70–99)
Glucose-Capillary: 131 mg/dL — ABNORMAL HIGH (ref 70–99)
Glucose-Capillary: 139 mg/dL — ABNORMAL HIGH (ref 70–99)
Glucose-Capillary: 187 mg/dL — ABNORMAL HIGH (ref 70–99)
Glucose-Capillary: 258 mg/dL — ABNORMAL HIGH (ref 70–99)
Glucose-Capillary: 255 mg/dL — ABNORMAL HIGH (ref 70–99)

## 2024-05-07 LAB — MAGNESIUM
Magnesium: 2.2 mg/dL (ref 1.7–2.4)
Magnesium: 2.2 mg/dL (ref 1.7–2.4)

## 2024-05-07 MED ORDER — ENOXAPARIN SODIUM 40 MG/0.4ML IJ SOSY
40.0000 mg | PREFILLED_SYRINGE | Freq: Every day | INTRAMUSCULAR | Status: DC
  Administered 2024-05-07 – 2024-05-08 (×2): 40 mg via SUBCUTANEOUS
  Filled 2024-05-07 (×2): qty 0.4

## 2024-05-07 MED ORDER — INSULIN ASPART 100 UNIT/ML IJ SOLN
0.0000 [IU] | INTRAMUSCULAR | Status: DC
  Administered 2024-05-07: 4 [IU] via SUBCUTANEOUS
  Administered 2024-05-07: 12 [IU] via SUBCUTANEOUS
  Administered 2024-05-07: 2 [IU] via SUBCUTANEOUS
  Administered 2024-05-07: 8 [IU] via SUBCUTANEOUS
  Administered 2024-05-08: 12 [IU] via SUBCUTANEOUS
  Administered 2024-05-08: 8 [IU] via SUBCUTANEOUS
  Administered 2024-05-08: 12 [IU] via SUBCUTANEOUS
  Administered 2024-05-08: 8 [IU] via SUBCUTANEOUS
  Administered 2024-05-08: 16 [IU] via SUBCUTANEOUS
  Administered 2024-05-09: 4 [IU] via SUBCUTANEOUS
  Administered 2024-05-09: 12 [IU] via SUBCUTANEOUS
  Administered 2024-05-09: 4 [IU] via SUBCUTANEOUS

## 2024-05-07 MED ORDER — ALBUTEROL SULFATE (2.5 MG/3ML) 0.083% IN NEBU
2.5000 mg | INHALATION_SOLUTION | RESPIRATORY_TRACT | Status: DC | PRN
  Administered 2024-05-09: 2.5 mg via RESPIRATORY_TRACT
  Filled 2024-05-07 (×2): qty 3

## 2024-05-07 MED ORDER — MAGNESIUM SULFATE 2 GM/50ML IV SOLN
INTRAVENOUS | Status: AC
  Filled 2024-05-07: qty 50

## 2024-05-07 MED ORDER — INFLUENZA VAC SPLIT HIGH-DOSE 0.5 ML IM SUSY
0.5000 mL | PREFILLED_SYRINGE | INTRAMUSCULAR | Status: DC
  Filled 2024-05-07: qty 0.5

## 2024-05-07 MED ORDER — LORATADINE 10 MG PO TABS
10.0000 mg | ORAL_TABLET | Freq: Every day | ORAL | Status: DC
  Administered 2024-05-07 – 2024-05-13 (×7): 10 mg via ORAL
  Filled 2024-05-07 (×8): qty 1

## 2024-05-07 MED ORDER — LEVOCETIRIZINE DIHYDROCHLORIDE 5 MG PO TABS
5.0000 mg | ORAL_TABLET | Freq: Every evening | ORAL | Status: DC
Start: 2024-05-07 — End: 2024-05-07

## 2024-05-07 MED ORDER — FUROSEMIDE 10 MG/ML IJ SOLN
40.0000 mg | Freq: Once | INTRAMUSCULAR | Status: DC
Start: 2024-05-07 — End: 2024-05-07

## 2024-05-07 MED ORDER — MODAFINIL 100 MG PO TABS
100.0000 mg | ORAL_TABLET | Freq: Every day | ORAL | Status: DC
  Administered 2024-05-07 – 2024-05-10 (×2): 100 mg via ORAL
  Filled 2024-05-07 (×6): qty 1

## 2024-05-07 MED ORDER — MONTELUKAST SODIUM 10 MG PO TABS
10.0000 mg | ORAL_TABLET | Freq: Every day | ORAL | Status: DC
  Administered 2024-05-07 – 2024-05-12 (×6): 10 mg via ORAL
  Filled 2024-05-07 (×6): qty 1

## 2024-05-07 MED ORDER — INSULIN ASPART 100 UNIT/ML IJ SOLN: 0.0000 [IU] | INTRAMUSCULAR | Status: DC

## 2024-05-07 NOTE — Progress Notes (Signed)
      [address]       [place] [id number]             [phone]                 1 Day Post-Op Procedure(s) (LRB): OFF PUMP CORONARY ARTERY BYPASS GRAFTING (CABG) TIMES ONE USING LEFT INTERNAL MAMMARY ARTERY (N/A) ECHOCARDIOGRAM, TRANSESOPHAGEAL, INTRAOPERATIVE (N/A)   Events: No events _______________________________________________________________ Vitals: BP 114/63   Pulse 69   Temp (!) 101.5 F (38.6 C)   Resp 19   Ht 5' 4 (1.626 m)   Wt 106.8 kg   SpO2 93%   BMI 40.42 kg/m  Filed Weights   05/04/24 1013 05/06/24 0551 05/07/24 0535  Weight: 98.3 kg 97.7 kg 106.8 kg     - Neuro: alert NAD  - Cardiovascular: sinus  Drips: neo.   CVP:  [7 mmHg-31 mmHg] 18 mmHg CO:  [1.3 L/min-7.6 L/min] 6.3 L/min CI:  [0.6 L/min/m2-3.4 L/min/m2] 2.8 L/min/m2  - Pulm: EWOB  ABG    Component Value Date/Time   PHART 7.347 (L) 05/07/2024 0209   PCO2ART 47.3 05/07/2024 0209   PO2ART 93 05/07/2024 0209   HCO3 25.9 05/07/2024 0209   TCO2 27 05/07/2024 0209   ACIDBASEDEF 1.0 05/06/2024 2042   O2SAT 97 05/07/2024 0209    - Abd: ND - Extremity: warm  .Intake/Output      09/20 0701 09/21 0700 09/21 0701 09/22 0700   P.O.     I.V. (mL/kg) 2658.3 (24.9) 91.6 (0.9)   IV Piggyback 1244.9    Total Intake(mL/kg) 3903.1 (36.5) 91.6 (0.9)   Urine (mL/kg/hr) 715 (0.3) 170 (0.4)   Stool     Blood 350    Chest Tube 180 30   Total Output 1245 200   Net +2658.1 -108.4           _______________________________________________________________ Labs:    Latest Ref Rng & Units 05/07/2024    5:59 AM 05/07/2024    2:09 AM 05/06/2024    8:42 PM  CBC  WBC 4.0 - 10.5 K/uL 12.9     Hemoglobin 12.0 - 15.0 g/dL 88.6  88.3  87.7   Hematocrit 36.0 - 46.0 % 34.8  34.0  36.0   Platelets 150 - 400 K/uL 216         Latest Ref Rng & Units 05/07/2024    5:59 AM 05/07/2024    2:09 AM 05/06/2024    8:42 PM  CMP  Glucose 70 - 99 mg/dL 837     BUN 8 - 23 mg/dL 20      Creatinine 9.55 - 1.00 mg/dL 8.74     Sodium 864 - 854 mmol/L 135  137  138   Potassium 3.5 - 5.1 mmol/L 4.4  4.7  5.1   Chloride 98 - 111 mmol/L 100     CO2 22 - 32 mmol/L 24     Calcium  8.9 - 10.3 mg/dL 9.1       CXR: PV congestion  _______________________________________________________________  Assessment and Plan: POD 1 s/p CABG  Neuro: pain controlled CV: on A/S/BB.  Weaning neo.  Pacer set to back-up.  [place] in the 46s Pulm: IS, ambulation Renal: creat up slightly GI: on diet Heme: stable ID: [email or web address] Endo: SSI Dispo: continue ICU care   Linnie MALVA Rayas 05/07/2024 10:46 AM

## 2024-05-07 NOTE — Evaluation (Signed)
 Physical Therapy Evaluation Patient Details Name: Rachel Odonnell MRN: [id number] DOB: 01/09/1956 Today's Date: 05/07/2024  History of Present Illness  68 year old woman Admitted with ACS and a non-STEMI.  Repeat cardiac catheterization 05/04/2024 showed severe one-vessel CAD due to in-stent restenosis in the LAD.  Echocardiogram with intact LV function and mild AS.  She went for one-vessel CABG on 05/06/2024; former smoker, history of CAD (PTCI), with type 2 diabetes, hyperlipidemia, hypertension, paroxysmal atrial fibrillation, ADD with anxiety and depression, OSA  Clinical Impression   Pt admitted with above diagnosis. Lives at home with husband , in a single-level home with a level entry; Prior to admission, pt fully independent, driving; Presents to PT with a functional decline compared to her baseline, generalized weakness, decr activity tolerance, motion restrictions/sternal prec; Had just gotten up and oOB with RN when PT arrived, and in pain; Opted to allow pt to stay seated for PT eval;  Post-surgical pain is most limiting factor today, anticipate solid recovery; Pt currently with functional limitations due to the deficits listed below (see PT Problem List). Pt will benefit from skilled PT to increase their independence and safety with mobility to allow discharge to the venue listed below.           If plan is discharge home, recommend the following: A little help with walking and/or transfers;A little help with bathing/dressing/bathroom;Assistance with cooking/housework;Help with stairs or ramp for entrance   Can travel by private vehicle        Equipment Recommendations Rolling walker (2 wheels);BSC/3in1;Other (comment) (Can consider tub transfer bench; Pt seems to prefer to use her bathroom that has a tub)  Recommendations for Other Services       Functional Status Assessment Patient has had a recent decline in their functional status and demonstrates the ability to make significant  improvements in function in a reasonable and predictable amount of time.     Precautions / Restrictions Precautions Precautions: Fall Recall of Precautions/Restrictions: Intact Precaution/Restrictions Comments: Discussed sternal precautions Restrictions Other Position/Activity Restrictions: Sternal Prec      Mobility                 General bed mobility comments: Had just gotten up with Nursing to teh recliner    Transfers Overall transfer level: Needs assistance                 General transfer comment: RN reports pt took weight onto her LEs and powered up well                Wheelchair Mobility     Tilt Bed    Modified Rankin (Stroke Patients Only)       Balance Overall balance assessment: Needs assistance   Sitting balance-Leahy Scale: Fair Sitting balance - Comments: Heavy mod assist to support pt in moving from supported to unsupproted sitting in recliner; Able to maintain balance sitting edge of chair while holding heart pillow                                     Pertinent Vitals/Pain Pain Assessment Pain Assessment: 0-10 Pain Score: 7  Pain Location: Sternum Pain Descriptors / Indicators: Aching, Operative site guarding, Sore Pain Intervention(s): Monitored during session, Patient requesting pain meds-RN notified    Home Living Family/patient expects to be discharged to:: Private residence Living Arrangements: Spouse/significant other Available Help at Discharge: Family Type of Home: House Home  Access: Level entry (at garage)       Home Layout: One level Home Equipment: None      Prior Function Prior Level of Function : Independent/Modified Independent                     Extremity/Trunk Assessment   Upper Extremity Assessment Upper Extremity Assessment: Generalized weakness (and keeping sternal precautions)    Lower Extremity Assessment Lower Extremity Assessment: Generalized weakness        Communication   Communication Communication: No apparent difficulties    Cognition Arousal: Alert Behavior During Therapy: WFL for tasks assessed/performed   PT - Cognitive impairments: No apparent impairments                         Following commands: Intact       Cueing Cueing Techniques: Verbal cues, Gestural cues     General Comments General comments (skin integrity, edema, etc.): Session conducted on supplemental O2; BP sitting upright 108/80, HR 57, paced    Exercises     Assessment/Plan    PT Assessment Patient needs continued PT services  PT Problem List Decreased strength;Decreased activity tolerance;Decreased balance;Decreased mobility;Decreased knowledge of use of DME;Decreased safety awareness;Decreased knowledge of precautions;Cardiopulmonary status limiting activity;Pain       PT Treatment Interventions DME instruction;Gait training;Stair training;Functional mobility training;Therapeutic activities;Therapeutic exercise;Balance training;Patient/family education    PT Goals (Current goals can be found in the Care Plan section)  Acute Rehab PT Goals Patient Stated Goal: get home[place] PT Goal Formulation: With patient Time For Goal Achievement: 05/21/24 Potential to Achieve Goals: Good    Frequency Min 3X/week     Co-evaluation               AM-PAC PT 6 Clicks Mobility  Outcome Measure Help needed turning from your back to your side while in a flat bed without using bedrails?: A Little Help needed moving from lying on your back to sitting on the side of a flat bed without using bedrails?: A Lot Help needed moving to and from a bed to a chair (including a wheelchair)?: A Little Help needed standing up from a chair using your arms (e.g., wheelchair or bedside chair)?: A Little Help needed to walk in hospital room?: A Little Help needed climbing 3-5 steps with a railing? : A Lot 6 Click Score: 16     End of Session   Activity Tolerance: Patient tolerated treatment well Patient left: in chair;with call bell/phone within reach;with family/visitor present Nurse Communication: Mobility status PT Visit Diagnosis: Unsteadiness on feet (R26.81);Muscle weakness (generalized) (M62.81) (Decr functional capacity)    Time: 8494-8464 PT Time Calculation (min) (ACUTE ONLY): 30 min   Charges:   PT Evaluation $PT Eval Moderate Complexity: 1 Mod PT Treatments $Therapeutic Activity: 8-22 mins PT General Charges $$ ACUTE PT VISIT: 1 Visit         Silvano Currier, PT  Acute Rehabilitation Services Office [phone] Secure Chat welcomed   Silvano VEAR Currier 05/07/2024, 4:20 PM

## 2024-05-07 NOTE — Consult Note (Addendum)
 NAME:  Rachel Odonnell, MRN:  [id number], DOB:  1956/01/16, LOS: 5 ADMISSION DATE:  05/01/2024, CONSULTATION DATE: 05/06/2024 REFERRING MD: Dr. Shyrl, REASON FOR CONSUL.TATION: Postoperative ventilator management  History of Present Illness:  68 year old woman, former smoker, history of CAD (PTCI), with type 2 diabetes, hyperlipidemia, hypertension, paroxysmal atrial fibrillation, ADD with anxiety and depression, OSA.  Admitted with ACS and a non-STEMI.  Repeat cardiac catheterization 05/04/2024 showed severe one-vessel CAD due to in-stent restenosis in the LAD.  Echocardiogram with intact LV function and mild AS.  She went for one-vessel CABG on 05/06/2024.  Admitted to the ICU postoperatively intubated and sedated.   Pump time: 0 Cell saver: 0 EBL: 350 cc UOP: 500 cc  Pertinent  Medical History   Past Medical History:  Diagnosis Date   ADD (attention deficit disorder)    Diabetes mellitus without complication (HCC)    Hypertension     Significant Hospital Events: Including procedures, antibiotic start and stop dates in addition to other pertinent events   9/20 CABG x 1  Interim History / Subjective:  Fevers overnight, 101.7.  Completing postop cefazolin  Wore bilevel comfortably all night, pCO2 now normalized postop Phenylephrine  weaned to off Precedex  0.2 Insulin  1.6 SCr 1.25 (up slightly) I/O+ 4.2 L total, UOP 715 cc / 24 hours, chest tube 180 cc  Comfortable, not hungry.   Objective    Blood pressure 120/66, pulse 83, temperature (!) 101.7 F (38.7 C), resp. rate 17, height 5' 4 (1.626 m), weight 106.8 kg, SpO2 95%. CVP:  [7 mmHg-31 mmHg] 13 mmHg CO:  [1.3 L/min-7.6 L/min] 6.4 L/min CI:  [0.6 L/min/m2-3.4 L/min/m2] 2.8 L/min/m2  Vent Mode: SIMV;PSV;CPAP FiO2 (%):  [35 %-50 %] 35 % Set Rate:  [4 bmp-16 bmp] 16 bmp Vt Set:  [540 mL] 540 mL PEEP:  [5 cmH20] 5 cmH20 Pressure Support:  [5 cmH20-10 cmH20] 10 cmH20 Plateau Pressure:  [16 cmH20] 16 cmH20    Intake/Output Summary (Last 24 hours) at 05/07/2024 0720 Last data filed at 05/07/2024 0600 Gross per 24 hour  Intake 3866.95 ml  Output 1245 ml  Net 2621.95 ml   Filed Weights   05/04/24 1013 05/06/24 0551 05/07/24 0535  Weight: 98.3 kg 97.7 kg 106.8 kg    Examination: General: Overweight woman, comfortable respiratory pattern on BiPAP. HENT: Oropharynx clear, strong voice, no secretions Lungs: Clear bilaterally, decreased inferiorly Cardiovascular: Dressing clean, paced 80, regular Abdomen: Obese, nondistended with positive SS Extremities: No edema, SCDs in place Neuro: Awake and alert, answers questions, follows commands with good strength GU: Foley catheter in place  Resolved problem list   Assessment and Plan   CAD, hyperlipidemia CABGx1 on 9/20 - post-op management per TCTS - Phenylephrine  weaned off - Remains paced, duration, pacer wires as per TCTS plans - Mediastinal tubes as per surgery plans - Maintain adequate pain control: oxycodone , tramadol , morphine , bowel regimen - Aspirin  and rosuvastatin  ordered.  Will discuss timing of Plavix  with TCTS - Metoprolol  as ordered - 4 L positive, consider diuresis on 9/21.  Follow electrolytes closely and replete as indicated   Post-operative vent management History of tobacco, question COPD Allergic rhinitis -Extubated to BiPAP on 9/20 - Respiratory acidosis improved can change BiPAP to nightly and as needed - Push pulmonary hygiene, IS, mobilization, adequate pain control chest tubes in place - Order albuterol  available as needed - Restart home montelukast , levocetirizine  Postoperative fever, WBC 12.9 -No clear evidence of infection, completing postop cefazolin  - Consider blood cultures, will discuss with  Dr. Shyrl  Diabetes mellitus with hyperglycemia, hemoglobin A1c 9.2 Diabetic neuropathy -Home Jardiance  and metformin  on hold.  Semaglutide  is also on her home med list -Work to transition insulin  infusion  to subcutaneous regimen, 9/21 - Restart home Neurontin  with taking p.o., stable to do so  OSA - Plan for nocturnal CPAP or BiPAP.  Good compliance and outpatient  Hypothyroidism - Continue Synthroid  as ordered  Restless leg syndrome - Continue Requip  as ordered  GERD - Continue pantoprazole  as ordered  ADD, anxiety/depression -On home Aricept , Celexa  - Restart Provigil , hold BuSpar  for now     Labs   CBC: Recent Labs  Lab 05/05/24 0321 05/06/24 0203 05/06/24 0809 05/06/24 1055 05/06/24 1106 05/06/24 1731 05/06/24 1745 05/06/24 2042 05/07/24 0209 05/07/24 0559  WBC 9.7 10.2  --  13.8*  --   --  12.7*  --   --  12.9*  HGB 14.2 14.7   < > 12.8   < > 11.6* 12.1 12.2 11.6* 11.3*  HCT 42.5 44.3   < > 38.0   < > 34.0* 36.8 36.0 34.0* 34.8*  MCV 91.6 91.3  --  91.8  --   --  92.5  --   --  95.9  PLT 249 263  --  275  --   --  274  --   --  216   < > = values in this interval not displayed.    Basic Metabolic Panel: Recent Labs  Lab 05/04/24 0328 05/05/24 0321 05/06/24 0203 05/06/24 0809 05/06/24 9078 05/06/24 0955 05/06/24 1106 05/06/24 1731 05/06/24 1745 05/06/24 2042 05/07/24 0209 05/07/24 0559  NA 138 134* 137 138 136 136   < > 138 136 138 137 135  K 4.0 4.4 3.8 3.9 4.1 3.9   < > 4.2 4.6 5.1 4.7 4.4  CL 99 101 101 101 102 101  --   --  103  --   --  100  CO2 24 24 24   --   --   --   --   --  24  --   --  24  GLUCOSE 234* 269* 178* 170* 185* 189*  --   --  153*  --   --  162*  BUN 22 23 19 21 23 21   --   --  20  --   --  20  CREATININE 1.21* 1.03* 1.04* 0.90 0.90 1.00  --   --  1.09*  --   --  1.25*  CALCIUM  9.7 10.1 9.8  --   --   --   --   --  8.9  --   --  9.1  MG  --   --   --   --   --   --   --   --  2.7*  --   --  2.2   < > = values in this interval not displayed.   GFR: Estimated Creatinine Clearance: 51.3 mL/min (A) (by C-G formula based on SCr of 1.25 mg/dL (H)). Recent Labs  Lab 05/06/24 0203 05/06/24 1055 05/06/24 1745 05/06/24 1747  05/07/24 0559  WBC 10.2 13.8* 12.7*  --  12.9*  LATICACIDVEN  --   --   --  1.1  --     Liver Function Tests: No results for input(s): AST, ALT, ALKPHOS, BILITOT, PROT, ALBUMIN  in the last 168 hours. No results for input(s): LIPASE, AMYLASE in the last 168 hours. No results for input(s): AMMONIA in the last 168  hours.  ABG    Component Value Date/Time   PHART 7.347 (L) 05/07/2024 0209   PCO2ART 47.3 05/07/2024 0209   PO2ART 93 05/07/2024 0209   HCO3 25.9 05/07/2024 0209   TCO2 27 05/07/2024 0209   ACIDBASEDEF 1.0 05/06/2024 2042   O2SAT 97 05/07/2024 0209     Coagulation Profile: Recent Labs  Lab 05/03/24 0350 05/03/24 0915 05/04/24 0847 05/05/24 0321 05/06/24 1055  INR 2.8* 2.7* 1.9* 1.5* 1.5*    Cardiac Enzymes: No results for input(s): CKTOTAL, CKMB, CKMBINDEX, TROPONINI in the last 168 hours.  HbA1C: Hemoglobin A1C  Date/Time Value Ref Range Status  09/25/2021 12:00 AM 10.3  Final   HbA1c, POC (controlled diabetic range)  Date/Time Value Ref Range Status  07/14/2023 10:20 AM 10.6 (A) 0.0 - 7.0 % Final  03/04/2023 01:09 PM 10.2 (A) 0.0 - 7.0 % Final   Hgb A1c MFr Bld  Date/Time Value Ref Range Status  05/05/2024 03:21 AM 9.2 (H) 4.8 - 5.6 % Final    Comment:    (NOTE)         Prediabetes: 5.7 - 6.4         Diabetes: >6.4         Glycemic control for adults with diabetes: <7.0   04/16/2023 02:53 PM 11.4 (H) 4.8 - 5.6 % Final    Comment:    (NOTE)         Prediabetes: 5.7 - 6.4         Diabetes: >6.4         Glycemic control for adults with diabetes: <7.0     CBG: Recent Labs  Lab 05/06/24 2300 05/07/24 0127 05/07/24 0336 05/07/24 0554 05/07/24 0556  GLUCAP 135* 128* 116* 179* 127*     Critical care time: 31 min     Lamar Chris, MD, PhD 05/07/2024, 7:20 AM [facility] [phone] or if no answer before 7:00PM call [phone] For any issues after 7:00PM please call eLink  [phone]

## 2024-05-08 ENCOUNTER — Inpatient Hospital Stay (HOSPITAL_COMMUNITY)

## 2024-05-08 ENCOUNTER — Telehealth (HOSPITAL_COMMUNITY): Payer: Self-pay | Admitting: Pharmacy Technician

## 2024-05-08 ENCOUNTER — Other Ambulatory Visit (HOSPITAL_COMMUNITY): Payer: Self-pay

## 2024-05-08 ENCOUNTER — Encounter (HOSPITAL_COMMUNITY): Payer: Self-pay | Admitting: Thoracic Surgery (Cardiothoracic Vascular Surgery)

## 2024-05-08 DIAGNOSIS — Z951 Presence of aortocoronary bypass graft: Secondary | ICD-10-CM | POA: Diagnosis not present

## 2024-05-08 DIAGNOSIS — R918 Other nonspecific abnormal finding of lung field: Secondary | ICD-10-CM | POA: Diagnosis not present

## 2024-05-08 DIAGNOSIS — I480 Paroxysmal atrial fibrillation: Secondary | ICD-10-CM | POA: Diagnosis not present

## 2024-05-08 DIAGNOSIS — I214 Non-ST elevation (NSTEMI) myocardial infarction: Secondary | ICD-10-CM | POA: Diagnosis not present

## 2024-05-08 DIAGNOSIS — J90 Pleural effusion, not elsewhere classified: Secondary | ICD-10-CM | POA: Diagnosis not present

## 2024-05-08 DIAGNOSIS — J811 Chronic pulmonary edema: Secondary | ICD-10-CM | POA: Diagnosis not present

## 2024-05-08 DIAGNOSIS — J939 Pneumothorax, unspecified: Secondary | ICD-10-CM | POA: Diagnosis not present

## 2024-05-08 DIAGNOSIS — I951 Orthostatic hypotension: Secondary | ICD-10-CM | POA: Diagnosis not present

## 2024-05-08 LAB — GLUCOSE, CAPILLARY
Glucose-Capillary: 237 mg/dL — ABNORMAL HIGH (ref 70–99)
Glucose-Capillary: 223 mg/dL — ABNORMAL HIGH (ref 70–99)
Glucose-Capillary: 292 mg/dL — ABNORMAL HIGH (ref 70–99)
Glucose-Capillary: 280 mg/dL — ABNORMAL HIGH (ref 70–99)
Glucose-Capillary: 324 mg/dL — ABNORMAL HIGH (ref 70–99)

## 2024-05-08 LAB — PREPARE PLATELET PHERESIS
Unit division: 0
Unit division: 0
Unit division: 0

## 2024-05-08 LAB — BASIC METABOLIC PANEL WITH GFR
Anion gap: 9 (ref 5–15)
Anion gap: 8 (ref 5–15)
BUN: 26 mg/dL — ABNORMAL HIGH (ref 8–23)
BUN: 26 mg/dL — ABNORMAL HIGH (ref 8–23)
CO2: 24 mmol/L (ref 22–32)
CO2: 26 mmol/L (ref 22–32)
Calcium: 10 mg/dL (ref 8.9–10.3)
Calcium: 10 mg/dL (ref 8.9–10.3)
Chloride: 98 mmol/L (ref 98–111)
Chloride: 97 mmol/L — ABNORMAL LOW (ref 98–111)
Creatinine, Ser: 1.47 mg/dL — ABNORMAL HIGH (ref 0.44–1.00)
Creatinine, Ser: 1.3 mg/dL — ABNORMAL HIGH (ref 0.44–1.00)
GFR, Estimated: 39 mL/min — ABNORMAL LOW (ref >60)
GFR, Estimated: 45 mL/min — ABNORMAL LOW (ref >60)
Glucose, Bld: 232 mg/dL — ABNORMAL HIGH (ref 70–99)
Glucose, Bld: 285 mg/dL — ABNORMAL HIGH (ref 70–99)
Potassium: 4.5 mmol/L (ref 3.5–5.1)
Potassium: 4.5 mmol/L (ref 3.5–5.1)
Sodium: 131 mmol/L — ABNORMAL LOW (ref 135–145)
Sodium: 131 mmol/L — ABNORMAL LOW (ref 135–145)

## 2024-05-08 LAB — BPAM PLATELET PHERESIS
Blood Product Expiration Date: 202509202359
Blood Product Expiration Date: 202509212359
Blood Product Expiration Date: 202509232359
ISSUE DATE / TIME: 202509192054
ISSUE DATE / TIME: 202509221013
Unit Type and Rh: 202509232359
Unit Type and Rh: 600
Unit Type and Rh: 6200
Unit Type and Rh: 7300

## 2024-05-08 LAB — CBC
HCT: 32.9 % — ABNORMAL LOW (ref 36.0–46.0)
Hemoglobin: 10.4 g/dL — ABNORMAL LOW (ref 12.0–15.0)
MCH: 30.6 pg (ref 26.0–34.0)
MCHC: 31.6 g/dL (ref 30.0–36.0)
MCV: 96.8 fL (ref 80.0–100.0)
Platelets: 217 K/uL (ref 150–400)
RBC: 3.4 MIL/uL — ABNORMAL LOW (ref 3.87–5.11)
RDW: 13.6 % (ref 11.5–15.5)
WBC: 15 K/uL — ABNORMAL HIGH (ref 4.0–10.5)
nRBC: 0 % (ref 0.0–0.2)

## 2024-05-08 LAB — MAGNESIUM
Magnesium: 2.3 mg/dL (ref 1.7–2.4)
Magnesium: 2 mg/dL (ref 1.7–2.4)

## 2024-05-08 LAB — PHOSPHORUS
Phosphorus: 2.9 mg/dL (ref 2.5–4.6)
Phosphorus: 2.8 mg/dL (ref 2.5–4.6)

## 2024-05-08 MED ORDER — ORAL CARE MOUTH RINSE: 15.0000 mL | OROMUCOSAL | Status: DC | PRN

## 2024-05-08 MED ORDER — BISACODYL 10 MG RE SUPP
10.0000 mg | Freq: Once | RECTAL | Status: AC
  Administered 2024-05-08: 10 mg via RECTAL
  Filled 2024-05-08: qty 1

## 2024-05-08 MED ORDER — ENSURE PLUS HIGH PROTEIN PO LIQD
237.0000 mL | Freq: Two times a day (BID) | ORAL | Status: DC
  Administered 2024-05-08 – 2024-05-11 (×5): 237 mL via ORAL

## 2024-05-08 MED ORDER — FUROSEMIDE 10 MG/ML IJ SOLN
40.0000 mg | Freq: Once | INTRAMUSCULAR | Status: AC
  Administered 2024-05-08: 40 mg via INTRAVENOUS
  Filled 2024-05-08: qty 4

## 2024-05-08 MED ORDER — INSULIN GLARGINE 100 UNIT/ML ~~LOC~~ SOLN
25.0000 [IU] | Freq: Two times a day (BID) | SUBCUTANEOUS | Status: DC
  Administered 2024-05-08: 25 [IU] via SUBCUTANEOUS
  Filled 2024-05-08 (×3): qty 0.25

## 2024-05-08 MED ORDER — INSULIN GLARGINE 100 UNIT/ML ~~LOC~~ SOLN
25.0000 [IU] | Freq: Every day | SUBCUTANEOUS | Status: DC
  Administered 2024-05-08: 25 [IU] via SUBCUTANEOUS
  Filled 2024-05-08: qty 0.25

## 2024-05-08 NOTE — TOC Initial Note (Signed)
 Transition of Care [facility]) - Initial/Assessment Note    Patient Details  Name: Rachel Odonnell MRN: [id number] Date of Birth: [birth year]  Transition [facility] CM/SW Contact:    Sudie Erminio Deems, RN Phone Number: 05/08/2024, 1:49 PM  Clinical Narrative:  Patient presented for Nstemi-POD-2 CABG. PTA patient was independent from home with spouse. Spouse was at the bedside during the visit. Patient reports that she does not use any DME in the home. Inpatient Case Manager will continue to follow for additional disposition needs as she progresses.    Expected Discharge Plan: Home/Self Care Barriers to Discharge: Continued Medical Work up   Patient Goals and CMS Choice Patient states their goals for this hospitalization and ongoing recovery are:: plans to return home once stable.          Expected Discharge Plan and Services In-house Referral: NA Discharge Planning Services: CM Consult   Living arrangements for the past 2 months: Single Family Home                   DME Agency: NA       HH Arranged: NA          Prior Living Arrangements/Services Living arrangements for the past 2 months: Single Family Home Lives with:: Spouse Patient language and need for interpreter reviewed:: Yes Do you feel safe going back to the place where you live?: Yes      Need for Family Participation in Patient Care: Yes (Comment) Care giver support system in place?: Yes (comment)   Criminal Activity/Legal Involvement Pertinent to Current Situation/Hospitalization: No - Comment as needed  Activities of Daily Living   ADL Screening (condition at time of admission) Independently performs ADLs?: Yes (appropriate for developmental age) Is the patient deaf or have difficulty hearing?: No Does the patient have difficulty seeing, even when wearing glasses/contacts?: No Does the patient have difficulty concentrating, remembering, or making decisions?: No  Permission  Sought/Granted Permission sought to share information with : Family Supports, Case Manager                Emotional Assessment Appearance:: Appears stated age Attitude/Demeanor/Rapport: Engaged Affect (typically observed): Appropriate Orientation: : Oriented to Self, Oriented to Place, Oriented to  Time, Oriented to Situation Alcohol  / Substance Use: Not Applicable Psych Involvement: No (comment)  Admission diagnosis:  ACS (acute coronary syndrome) (HCC) [I24.9] NSTEMI (non-ST elevated myocardial infarction) (HCC) [I21.4] S/P CABG x 1 [Z95.1] Patient Active Problem List   Diagnosis Date Noted   S/P CABG x 1 05/06/2024   NSTEMI (non-ST elevated myocardial infarction) (HCC) 05/03/2024   ACS (acute coronary syndrome) (HCC) 05/02/2024   Dyslipidemia 05/02/2024   Type 2 diabetes mellitus with peripheral neuropathy (HCC) 05/02/2024   OSA (obstructive sleep apnea) 05/02/2024   Anxiety and depression 05/02/2024   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 04/30/2024   OAB (overactive bladder) 07/29/2023   Recurrent UTI 07/29/2023   CAD (coronary artery disease) 04/17/2023   Mild pulmonary hypertension (HCC) 04/17/2023   Angina pectoris (HCC) 04/16/2023   Presence of urogenital implant 02/16/2023   History of stress incontinence 02/16/2023   Urge incontinence 02/16/2023   Urinary urgency 02/16/2023   Insulin  long-term use (HCC) 01/28/2023   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   Atrial fibrillation (HCC) 01/01/2022   History of colon polyps 12/31/2021   Mixed hyperlipidemia 12/16/2021   Hypothyroidism 12/16/2021   Vitamin D  deficiency 12/16/2021   Morbid obesity (HCC) 12/16/2021   Non-alcoholic fatty liver disease 12/16/2021  Tremor 11/17/2021   Memory loss 11/17/2021   Hypersomnolence 09/24/2021   Restless legs syndrome (RLS) 09/24/2021   Unstable angina (HCC) 06/13/2021   Centrilobular emphysema (HCC) 05/14/2021   Obstructive sleep apnea 05/14/2021   Chronic stable angina (HCC)  05/14/2021   DM type 2 causing vascular disease (HCC) 05/31/2016   Essential hypertension, benign 05/31/2016   Migraine with aura 05/31/2016   Overweight 11/03/2011   Kidney stones 11/03/2011   PCP:  Jeanette Comer [name] Pharmacy:   [facility] - [place], [place] - [address] [place] [address] Phone: [phone] Fax: [phone]  [facility] [address] [place] - [address] [place] [address] Phone: [phone] Fax: [phone]     Social Drivers of Health (SDOH) Social History: SDOH Screenings   Food Insecurity: No Food Insecurity (05/02/2024)  Housing: Low Risk  (05/02/2024)  Transportation Needs: No Transportation Needs (05/02/2024)  Utilities: Not At Risk (05/02/2024)  Alcohol  Screen: Low Risk  (10/10/2021)  Depression (PHQ2-9): Medium Risk (05/19/2023)  Financial Resource Strain: Low Risk  (10/10/2021)  Physical Activity: Inactive (04/08/2023)   Received from [facility]  Social Connections: Socially Integrated (05/02/2024)  Stress: No Stress Concern Present (10/10/2021)  Tobacco Use: Medium Risk (05/05/2024)  Health Literacy: Low Risk  (12/31/2021)   Received from [facility]   SDOH Interventions:     Readmission Risk Interventions     No data to display

## 2024-05-08 NOTE — Discharge Summary (Addendum)
 [address] [place] [address]             [phone]        Physician Discharge Summary  Patient ID: Rachel Odonnell MRN: [id number] DOB/AGE: [birth year] 68 y.o.  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 04/30/2024   OAB (overactive bladder) 07/29/2023   Recurrent UTI 07/29/2023   CAD (coronary artery disease) 04/17/2023   Mild pulmonary hypertension (HCC) 04/17/2023   Angina pectoris 04/16/2023   Presence of urogenital implant 02/16/2023   History of stress incontinence 02/16/2023   Urge incontinence 02/16/2023   Urinary urgency 02/16/2023   Insulin  long-term use (HCC) 01/28/2023   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   Atrial fibrillation (HCC) 01/01/2022   History of colon polyps 12/31/2021   Mixed hyperlipidemia 12/16/2021   Hypothyroidism 12/16/2021   Vitamin D  deficiency 12/16/2021   Morbid obesity (HCC) 12/16/2021   Non-alcoholic fatty liver disease 12/16/2021   Tremor 11/17/2021   Memory loss 11/17/2021   Hypersomnolence 09/24/2021   Restless legs syndrome (RLS) 09/24/2021   Unstable angina (HCC) 06/13/2021   Centrilobular emphysema (HCC) 05/14/2021   Obstructive sleep apnea 05/14/2021   Chronic stable angina 05/14/2021   DM type 2 causing vascular disease (HCC) 05/31/2016   Essential hypertension, benign 05/31/2016   Migraine with aura 05/31/2016   Overweight 11/03/2011   Kidney stones 11/03/2011   Discharge Diagnoses:  Patient Active Problem List   Diagnosis Date Noted    Orthostatic hypotension 05/09/2024   S/P CABG x 1 05/06/2024   NSTEMI (non-ST elevated myocardial infarction) (HCC) 05/03/2024   ACS (acute coronary syndrome) (HCC) 05/02/2024   Dyslipidemia 05/02/2024   Type 2 diabetes mellitus with peripheral neuropathy (HCC) 05/02/2024   OSA (obstructive sleep apnea) 05/02/2024   Anxiety and depression 05/02/2024   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 04/30/2024   OAB (overactive bladder) 07/29/2023   Recurrent UTI 07/29/2023   CAD (coronary artery disease) 04/17/2023   Mild pulmonary hypertension (HCC) 04/17/2023   Angina pectoris 04/16/2023   Presence of urogenital implant 02/16/2023   History of stress incontinence 02/16/2023   Urge incontinence 02/16/2023   Urinary urgency 02/16/2023   Insulin  long-term use (HCC) 01/28/2023   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   Atrial fibrillation (HCC) 01/01/2022   History of colon polyps 12/31/2021   Mixed hyperlipidemia 12/16/2021   Hypothyroidism 12/16/2021   Vitamin D  deficiency 12/16/2021   Morbid obesity (HCC) 12/16/2021   Non-alcoholic fatty liver disease 12/16/2021   Tremor 11/17/2021   Memory loss 11/17/2021   Hypersomnolence 09/24/2021   Restless legs syndrome (RLS) 09/24/2021   Unstable angina (HCC) 06/13/2021   Centrilobular emphysema (HCC) 05/14/2021   Obstructive sleep apnea 05/14/2021   Chronic stable angina 05/14/2021   DM type 2 causing vascular disease (HCC) 05/31/2016   Essential hypertension, benign 05/31/2016   Migraine with aura 05/31/2016   Overweight 11/03/2011   Kidney stones 11/03/2011   Discharged Condition: good   Referring: No ref. provider found Primary Care: Skillman, Katherine E, PA-C Primary  Cardiologist:Samuel Debera, MD    History of Present Illness: At time of CT surgical consultation     we are asked to see this 68 year old female in CT surgical consultation for consideration of CABG.  The patient has multiple cardiac risk factors including type 2  diabetes mellitus, obstructive sleep apnea, dyslipidemia and hypertension.  She also has a history of paroxysmal atrial fibrillation.  She is a former smoker having quit in 1996.  Patient presented to the emergency department on 05/01/2024 with complaints of chest pain.  He has a known history of CAD.  He underwent DES of the LAD in August 2024.  She had an abrupt occlusion of a diagonal that was jailed by the stent had successful POBA.  She has ruled in for non-STEMI.  Peak measured high-sensitivity troponin I was 415.  Recently she has noted worsening exertional chest pain that has been associated with shortness of breath, fatigue and dizziness as well as nausea and some numbness into the left forearm.  The symptoms are similar to her previous angina prior to stenting.  She is admitted for further evaluation and treatment including cardiology consultation.  She has continued to have some episodes of chest pain.  She underwent repeat cardiac catheterization 05/04/2024 where she was found to have severe one-vessel coronary artery disease due to in-stent restenosis in the proximal portion of the LAD stent.  She does have a 60% first diagonal lesion well is a 60% first marginal stenosis and a 35% mid RCA lesion.  Echocardiogram done 05/02/2024 shows LVEF by estimation to be in the 70 to 75% range.  She was noted to have left ventricular hyperdynamic function.  She has mild aortic valve stenosis.   Hospital course: Early in the hospitalization following catheterization she continued to have episodes of chest pain.  Attempts were made to stabilize medically with nitroglycerin  drip and maximal medical therapy.  Due to the instability it was felt that CABG should be pursued on an more emergent basis and on 05/06/2024 she was taken the operating room and underwent off-pump CABG x 1.  She tolerated the procedure well and was taken to the surgical intensive care unit in stable condition.  Postoperative hospital  course: Patient was extubated without difficulty using standard post cardiac surgical protocols.  She initially did require some neo for blood pressure assistance.  She has remained neurologically intact.  Her creatinine is noted to be up slightly and is being monitored closely.  She did receive a Lasix  challenge on postop day #2 at which time creatinine was 1.47.  This has returned to baseline.  She has received further Lasix  and will require at least short-term at discharge.  All routine lines, monitors and drainage devices have been discontinued in the standard fashion.  She has had some bradycardia so beta-blocker is held.  Additionally she has had some atrial fibrillation but is not felt to be a candidate for amiodarone therapy due to her bradycardia.  On postop day #3 she was felt to be stable for transfer to the cardiac telemetry unit.  She was started back on her Coumadin  which she has been on for atrial fibrillation and pharmacy has been dosing.  She was on this preoperatively.  At discharge she will be resumed on home dosing with early INR check at the Coumadin  clinic.  Incisions are noted to be healing well without evidence of infection.  She is tolerating diet.  Oxygen is being weaned and it has been somewhat difficult as she  does desaturate on room air.  If she is not off oxygen by postop day 7 arrangements will be made for short-term home oxygen use.  She does have a mild expected acute blood loss anemia but it has stabilized.  Most recent hemoglobin hematocrit dated 05/10/2024 were 10.1 and 31.3 respectively.  Blood sugars have been difficult to control but medications have been adjusted throughout the postoperative period.  She does have a endocrinologist and knows she needs close follow-up with them at discharge to hopefully obtain better control of her diabetes long-term.  Overall, at the time of discharge the patient is felt to be quite stable.   Consults: pulmonary/intensive care  Significant  Diagnostic Studies:  DG Chest 1 View Result Date: 05/12/2024 CLINICAL DATA:  Follow-up pleural effusion, pulmonary edema and retrocardiac opacity. EXAM: DG CHEST 1V COMPARISON:  05/08/2024 FINDINGS: Stable enlarged cardiac silhouette. Mild decrease in dense airspace consolidation at the left lung base, including decreased pleural fluid. Interval mild patchy density at the right lung base. The interstitial markings remain mildly prominent with no Kerley lines seen. Unremarkable bones. IMPRESSION: 1. Mild decrease in dense atelectasis/pneumonia at the left lung base and decreased left pleural fluid. 2. Interval mild patchy atelectasis or pneumonia at the right lung base. 3. Stable cardiomegaly and mild chronic interstitial lung disease. Electronically Signed   By: Elspeth Bathe M.D.   On: 05/12/2024 10:54   ECHO INTRAOPERATIVE TEE Result Date: 05/09/2024  *INTRAOPERATIVE TRANSESOPHAGEAL REPORT *  Patient Name:   Rachel Odonnell Amey Date of Exam: 05/06/2024 Medical Rec #:  [id number]     Height:       64.0 in Accession #:    [id number]    Weight:       215.4 lb Date of Birth:  October 08, 1955    BSA:          2.02 m Patient Age:    67 years      BP:           99/57 mmHg Patient Gender: F             HR:           50 bpm. Exam Location:  Inpatient Transesophogeal exam was perform intraoperatively during surgical procedure. Patient was closely monitored under general anesthesia during the entirety of examination. Indications:     coronary artery bypass surgery Performing Phys: [id number] LINNIE KIDD LIGHTFOOT Diagnosing Phys: Cordella Stoltzfus Complications: No known complications during this procedure. POST-OP IMPRESSIONS _ Left Ventricle: The left ventricle is unchanged from pre-bypass. _ Right Ventricle: The right ventricle appears unchanged from pre-bypass. _ Aorta: The aorta appears unchanged from pre-bypass. _ Left Atrial Appendage: The left atrial appendage appears unchanged from pre-bypass. _ Aortic Valve: The aortic valve  appears unchanged from pre-bypass. _ Mitral Valve: The mitral valve appears unchanged from pre-bypass. _ Tricuspid Valve: The tricuspid valve appears unchanged from pre-bypass. _ Interatrial Septum: The interatrial septum appears unchanged from pre-bypass. _ Pericardium: The pericardium appears unchanged from pre-bypass. _ Comments: S/P OPCABG X1. 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 No new or worsening wall motion or valvular abnormalities. PRE-OP FINDINGS  Left Ventricle: The left ventricle has normal systolic function, with an ejection fraction of 55-60%. The cavity size was normal. Left ventricular diastolic parameters were normal. Right Ventricle: The right ventricle has normal systolic function. The cavity was normal. There is no increase in right ventricular wall thickness. Right ventricular systolic pressure is normal. Left Atrium: Left atrial size was normal in size. No  left atrial/left atrial appendage thrombus was detected. Left atrial appendage velocity is normal at greater than 40 cm/s. Right Atrium: Right atrial size was normal in size. Interatrial Septum: No atrial level shunt detected by color flow Doppler. There is no evidence of a patent foramen ovale. Pericardium: There is no evidence of pericardial effusion. There is no pleural effusion. Mitral Valve: The mitral valve is normal in structure. Mitral valve regurgitation is trivial by color flow Doppler. There is no evidence of mitral valve vegetation. There is No evidence of mitral stenosis. Tricuspid Valve: The tricuspid valve was normal in structure. Tricuspid valve regurgitation is mild by color flow Doppler. No evidence of tricuspid stenosis is present. There is no evidence of tricuspid valve vegetation. Aortic Valve: The aortic valve is normal in structure. Aortic valve regurgitation was not visualized by color flow Doppler. There is no stenosis of the aortic valve, with a calculated valve area of 1.24 cm. There is no evidence of aortic  valve vegetation. Pulmonic Valve: The pulmonic valve was normal in structure. Pulmonic valve regurgitation is not visualized by color flow Doppler. Aorta: The aortic root and ascending aorta are normal in size and structure. Pulmonary Artery: The pulmonary artery is of normal size. Shunts: There is no evidence of an atrial septal defect. +--------------+--------++ LEFT VENTRICLE         +--------------+--------++ PLAX 2D                +--------------+--------++ LVOT diam:    2.30 cm  +--------------+--------++ LVOT Area:    4.15 cm +--------------+--------++                        +--------------+--------++ +------------------+------------++ AORTIC VALVE                   +------------------+------------++ AV Area (Vmax):   1.31 cm     +------------------+------------++ AV Area (Vmean):  1.28 cm     +------------------+------------++ AV Area (VTI):    1.24 cm     +------------------+------------++ AV Vmax:          213.00 cm/s  +------------------+------------++ AV Vmean:         145.000 cm/s +------------------+------------++ AV VTI:           0.526 m      +------------------+------------++ AV Peak Grad:     18.1 mmHg    +------------------+------------++ AV Mean Grad:     10.0 mmHg    +------------------+------------++ LVOT Vmax:        67.40 cm/s   +------------------+------------++ LVOT Vmean:       44.800 cm/s  +------------------+------------++ LVOT VTI:         0.157 m      +------------------+------------++ LVOT/AV VTI ratio:0.30         +------------------+------------++  +--------------+-------++ AORTA                 +--------------+-------++ Ao Sinus diam:2.60 cm +--------------+-------++ Ao STJ diam:  2.2 cm  +--------------+-------++ Ao Asc diam:  3.30 cm +--------------+-------++ +--------------+----------++ MITRAL VALVE              +--------------+-------+ +--------------+----------++  SHUNTS                 MV Area (PHT):3.58 cm    +--------------+-------+ +--------------+----------++  Systemic VTI: 0.16 m  MV Peak grad: 2.8 mmHg    +--------------+-------+ +--------------+----------++  Systemic Diam:2.30 cm MV Mean grad: 1.0 mmHg    +--------------+-------+ +--------------+----------++ MV Vmax:  0.84 m/s   +--------------+----------++ MV Vmean:     30.8 cm/s  +--------------+----------++ MV VTI:       0.26 m     +--------------+----------++ MV PHT:       61.48 msec +--------------+----------++ MV Decel Time:212 msec   +--------------+----------++ +--------------+----------++ MV E velocity:85.80 cm/s +--------------+----------++ MV A velocity:56.30 cm/s +--------------+----------++ MV E/A ratio: 1.52       +--------------+----------++  Cordella Fix Electronically signed by Cordella Fix Signature Date/Time: 05/09/2024/10:01:09 AM    Final    DG Chest Port 1 View Result Date: 05/08/2024 EXAM: 1 VIEW XRAY OF THE CHEST 05/08/2024 05:33:00 AM COMPARISON: 05/07/2024 CLINICAL HISTORY: Pneumothorax. FINDINGS: LINES, TUBES AND DEVICES: Right IJ CVC in place with tip overlying SVC. Mediastinal drain in place. Left chest tube in place. LUNGS AND PLEURA: Mild pulmonary edema. Increased retrocardiac opacity. Small left pleural effusion. HEART AND MEDIASTINUM: Stable cardiomegaly. Post median sternotomy. BONES AND SOFT TISSUES: No acute osseous abnormality. IMPRESSION: 1. Small left pleural effusion and mild pulmonary edema. 2. Increased retrocardiac opacity. 3. Right IJ CVC in place with tip overlying SVC. Mediastinal drain and left chest tube in place. 4. Stable cardiomegaly. 5. Post median sternotomy. Electronically signed by: [name] Calk MD 05/08/2024 06:23 AM EDT RP Workstation: [id number]   DG Chest Port 1 View Result Date: 05/07/2024 CLINICAL DATA:  Status post CABG. EXAM: PORTABLE CHEST 1 VIEW COMPARISON:  On 2025 FINDINGS: The cardio pericardial  silhouette is enlarged. There is pulmonary vascular congestion without overt pulmonary edema. Left base atelectasis with tiny left pleural effusion evident. Left chest tube remains in place. Right IJ central line tip overlies the region of the innominate vein confluence. Telemetry leads overlie the chest. IMPRESSION: 1. Left base atelectasis with tiny left pleural effusion. 2. Pulmonary vascular congestion without overt pulmonary edema. Electronically Signed   By: Camellia Candle M.D.   On: 05/07/2024 07:14   DG Chest Port 1 View Result Date: 05/06/2024 CLINICAL DATA:  Hypoxia EXAM: PORTABLE CHEST 1 VIEW COMPARISON:  05/06/2024 FINDINGS: Median sternotomy. Right central line is unchanged. Interval extubation and removal of NG tube. Left chest tube remains in place without pneumothorax. Left base atelectasis. Right lung clear. No visible effusions. IMPRESSION: Interval extubation with left base atelectasis. No pneumothorax. Electronically Signed   By: Franky Crease M.D.   On: 05/06/2024 19:33   DG Chest Port 1 View Result Date: 05/06/2024 CLINICAL DATA:  Postop from CABG. EXAM: PORTABLE CHEST 1 VIEW COMPARISON:  05/01/2024 FINDINGS: Patient has undergone CABG since prior study. Small postop pneumomediastinum noted. Endotracheal tube, nasogastric tube, and right jugular central venous catheter are seen in appropriate position. Left chest tube also noted. No evidence of pneumothorax or pleural effusion. Both lungs are clear. IMPRESSION: Expected postop changes from CABG. No pneumothorax or other acute findings. Electronically Signed   By: Norleen DELENA Kil M.D.   On: 05/06/2024 11:43   VAS US  DOPPLER PRE CABG Result Date: 05/05/2024 PREOPERATIVE VASCULAR EVALUATION Patient Name:  KURSTIN DIMARZO  Date of Exam:   05/05/2024 Medical Rec #: [id number]      Accession #:    [id number] Date of Birth: 1955/11/20     Patient Gender: F Patient Age:   20 years Exam Location:  [facility] Procedure:      VAS US  DOPPLER PRE  CABG Referring Phys: HARRELL LIGHTFOOT --------------------------------------------------------------------------------  Indications:      Pre-CABG and NSTEMI. Risk Factors:     Hypertension, hyperlipidemia, Diabetes, coronary artery  disease. Other Factors:    Status post stenting to the LAD 2024, now with aggressive                   restenosis. Comparison Study: Prior carotid duplex indicating <50% bilateral ICA stenosis                   done 06/08/16 in [place] Performing Technologist: Rachel Pellet RVS  Examination Guidelines: A complete evaluation includes B-mode imaging, spectral Doppler, color Doppler, and power Doppler as needed of all accessible portions of each vessel. Bilateral testing is considered an integral part of a complete examination. Limited examinations for reoccurring indications may be performed as noted.  Right Carotid Findings: +----------+--------+--------+--------+------------+------------------+           PSV cm/sEDV cm/sStenosisDescribe    Comments           +----------+--------+--------+--------+------------+------------------+ CCA Prox  72      12              homogeneous                    +----------+--------+--------+--------+------------+------------------+ CCA Distal71      13                          intimal thickening +----------+--------+--------+--------+------------+------------------+ ICA Prox  92      17      1-39%   calcific    Shadowing          +----------+--------+--------+--------+------------+------------------+ ICA Mid   123     23              heterogenous                   +----------+--------+--------+--------+------------+------------------+ ICA Distal109     19                                             +----------+--------+--------+--------+------------+------------------+ ECA       103     11                                              +----------+--------+--------+--------+------------+------------------+ +----------+--------+-------+--------+------------+           PSV cm/sEDV cmsDescribeArm Pressure +----------+--------+-------+--------+------------+ Subclavian101                    138          +----------+--------+-------+--------+------------+ +---------+--------+--+--------+--+ VertebralPSV cm/s49EDV cm/s13 +---------+--------+--+--------+--+ Left Carotid Findings: +----------+--------+--------+--------+------------+--------+           PSV cm/sEDV cm/sStenosisDescribe    Comments +----------+--------+--------+--------+------------+--------+ CCA Prox  57      14              heterogenous         +----------+--------+--------+--------+------------+--------+ CCA Distal76      11              calcific             +----------+--------+--------+--------+------------+--------+ ICA Prox  122     21      1-39%   calcific             +----------+--------+--------+--------+------------+--------+ ICA Mid   122  20              heterogenous         +----------+--------+--------+--------+------------+--------+ ICA Distal117     18                                   +----------+--------+--------+--------+------------+--------+ ECA       199     6                                    +----------+--------+--------+--------+------------+--------+  +----------+--------+--------+--------+------------+ SubclavianPSV cm/sEDV cm/sDescribeArm Pressure +----------+--------+--------+--------+------------+           114                     141          +----------+--------+--------+--------+------------+ +---------+--------+--+--------+--+ VertebralPSV cm/s69EDV cm/s14 +---------+--------+--+--------+--+  ABI Findings: +------------------+-----+---------+ Rt Pressure (mmHg)IndexWaveform  +------------------+-----+---------+ 138                    triphasic  +------------------+-----+---------+ 153               1.09 biphasic  +------------------+-----+---------+ 141               1.00 biphasic  +------------------+-----+---------+ 89                0.63 Abnormal  +------------------+-----+---------+ +------------------+-----+---------+ Lt Pressure (mmHg)IndexWaveform  +------------------+-----+---------+ 141                    triphasic +------------------+-----+---------+ 156               1.11 biphasic  +------------------+-----+---------+ 126               0.89 biphasic  +------------------+-----+---------+ 97                0.69 Abnormal  +------------------+-----+---------+ +-------+---------------+ ABI/TBIToday's ABI/TBI +-------+---------------+ Right  1.09/0.63       +-------+---------------+ Left   1.11/0.69       +-------+---------------+  Right Doppler Findings: +--------+--------+---------+ Site    PressureDoppler   +--------+--------+---------+ [id number]     triphasic +--------+--------+---------+ Radial          triphasic +--------+--------+---------+ Ulnar           triphasic +--------+--------+---------+  Left Doppler Findings: +--------+--------+---------+ Site    PressureDoppler   +--------+--------+---------+ [id number]     triphasic +--------+--------+---------+ Radial          triphasic +--------+--------+---------+ Ulnar           triphasic +--------+--------+---------+   Summary: Right Carotid: Velocities in the right ICA are consistent with a 1-39% stenosis. Left Carotid: Velocities in the left ICA are consistent with a 1-39% stenosis. Vertebrals:  Bilateral vertebral arteries demonstrate antegrade flow. Subclavians: Normal flow hemodynamics were seen in bilateral subclavian              arteries. Right ABI: Resting right ankle-brachial index is within normal range. The right toe-brachial index is abnormal. Left ABI: Resting left ankle-brachial index is within normal  range. The left toe-brachial index is abnormal. Right Upper Extremity: Doppler waveforms remain within normal limits with right radial compression. Doppler waveforms decrease <50% with right ulnar compression. Left Upper Extremity: Doppler waveforms decrease 50% with left radial compression. Doppler waveforms decrease <50% with left ulnar compression.  Electronically signed by Penne Colorado MD  on 05/05/2024 at 7:48:40 PM.    Final    CARDIAC CATHETERIZATION Result Date: 05/04/2024   Prox RCA to Mid RCA lesion is 35% stenosed.   1st Mrg lesion is 60% stenosed.   Prox LAD lesion is 95% stenosed.   1st Diag lesion is 60% stenosed. 1.  Severe one-vessel coronary artery disease due to in-stent restenosis in the proximal portion of the LAD stent that was placed last year.  Stable left circumflex disease. 2.  Left ventricular angiography was not performed.  EF was normal by echo. 3.  Mild gradient across the aortic valve. 4.  Severe right radial artery spasm. Recommendations: The patient has very aggressive restenosis in the proximal LAD.  Management options include CABG versus drug-coated balloon angioplasty.  Will discuss during the heart meeting tomorrow.  If PCI is pursued, it will have to be done via the femoral approach. Resume heparin  drip 2 hours after TR band removal. I held clopidogrel  for now in case CABG is needed.   ECHOCARDIOGRAM COMPLETE Result Date: 05/02/2024    ECHOCARDIOGRAM REPORT   Patient Name:   EUDORA GUEVARRA Faucett Date of Exam: 05/02/2024 Medical Rec #:  [id number]     Height:       64.0 in Accession #:    [id number]    Weight:       217.4 lb Date of Birth:  05/11/1956    BSA:          2.027 m Patient Age:    67 years      BP:           105/60 mmHg Patient Gender: F             HR:           52 bpm. Exam Location:  [facility] Procedure: 2D Echo, Cardiac Doppler and Color Doppler (Both Spectral and Color            Flow Doppler were utilized during procedure). Indications:    NSTEMI l21.4  History:         Patient has prior history of Echocardiogram examinations, most                 recent 03/16/2023. CAD, Arrythmias:Atrial Fibrillation; Risk                 Factors:Hypertension, Diabetes, Dyslipidemia and OSA                 (obstructive sleep apnea).  Sonographer:    Aida Pizza RCS Referring Phys: [id number] JAN A MANSY IMPRESSIONS  1. Left ventricular ejection fraction, by estimation, is 70 to 75%. The left ventricle has hyperdynamic function. The left ventricle has no regional wall motion abnormalities. There is mild left ventricular hypertrophy. Left ventricular diastolic parameters were normal.  2. Right ventricular systolic function is normal. The right ventricular size is normal. Tricuspid regurgitation signal is inadequate for assessing PA pressure.  3. The mitral valve is normal in structure. Trivial mitral valve regurgitation. No evidence of mitral stenosis.  4. The aortic valve is tricuspid. There is mild calcification of the aortic valve. There is mild thickening of the aortic valve. Aortic valve regurgitation is not visualized. Mild aortic valve stenosis. Aortic valve area, by VTI measures 1.65 cm. Aortic valve mean gradient measures 14.0 mmHg.  5. The inferior vena cava is normal in size with greater than 50% respiratory variability, suggesting right atrial pressure of 3 mmHg. FINDINGS  Left Ventricle: Left ventricular ejection fraction,  by estimation, is 70 to 75%. The left ventricle has hyperdynamic function. The left ventricle has no regional wall motion abnormalities. The left ventricular internal cavity size was normal in size. There is mild left ventricular hypertrophy. Left ventricular diastolic parameters were normal. Right Ventricle: The right ventricular size is normal. Right vetricular wall thickness was not well visualized. Right ventricular systolic function is normal. Tricuspid regurgitation signal is inadequate for assessing PA pressure. Left Atrium: Left atrial size was normal in  size. Right Atrium: Right atrial size was normal in size. Pericardium: There is no evidence of pericardial effusion. Mitral Valve: The mitral valve is normal in structure. Trivial mitral valve regurgitation. No evidence of mitral valve stenosis. Tricuspid Valve: The tricuspid valve is normal in structure. Tricuspid valve regurgitation is trivial. No evidence of tricuspid stenosis. Aortic Valve: The aortic valve is tricuspid. There is mild calcification of the aortic valve. There is mild thickening of the aortic valve. There is mild aortic valve annular calcification. Aortic valve regurgitation is not visualized. Mild aortic stenosis is present. Aortic valve mean gradient measures 14.0 mmHg. Aortic valve peak gradient measures 24.2 mmHg. Aortic valve area, by VTI measures 1.65 cm. Pulmonic Valve: The pulmonic valve was not well visualized. Pulmonic valve regurgitation is trivial. No evidence of pulmonic stenosis. Aorta: The aortic root is normal in size and structure. Venous: The inferior vena cava is normal in size with greater than 50% respiratory variability, suggesting right atrial pressure of 3 mmHg. IAS/Shunts: No atrial level shunt detected by color flow Doppler.  LEFT VENTRICLE PLAX 2D LVIDd:         4.30 cm   Diastology LVIDs:         2.30 cm   LV e' medial:    5.59 cm/s LV PW:         1.20 cm   LV E/e' medial:  17.1 LV IVS:        1.30 cm   LV e' lateral:   5.84 cm/s LVOT diam:     2.00 cm   LV E/e' lateral: 16.4 LV SV:         99 LV SV Index:   49 LVOT Area:     3.14 cm  RIGHT VENTRICLE RV S prime:     12.60 cm/s TAPSE (M-mode): 2.5 cm LEFT ATRIUM             Index        RIGHT ATRIUM           Index LA diam:        4.10 cm 2.02 cm/m   RA Area:     10.10 cm LA Vol (A2C):   35.6 ml 17.56 ml/m  RA Volume:   16.70 ml  8.24 ml/m LA Vol (A4C):   56.8 ml 28.02 ml/m LA Biplane Vol: 44.6 ml 22.00 ml/m  AORTIC VALVE AV Area (Vmax):    1.40 cm AV Area (Vmean):   1.45 cm AV Area (VTI):     1.65 cm AV Vmax:            246.00 cm/s AV Vmean:          177.000 cm/s AV VTI:            0.600 m AV Peak Grad:      24.2 mmHg AV Mean Grad:      14.0 mmHg LVOT Vmax:         110.00 cm/s LVOT Vmean:  81.900 cm/s LVOT VTI:          0.316 m LVOT/AV VTI ratio: 0.53  AORTA Ao Root diam: 2.90 cm MITRAL VALVE MV Area (PHT): 2.95 cm    SHUNTS MV Decel Time: 257 msec    Systemic VTI:  0.32 m MV E velocity: 95.70 cm/s  Systemic Diam: 2.00 cm MV A velocity: 89.20 cm/s MV E/A ratio:  1.07 Dorn Ross MD Electronically signed by Dorn Ross MD Signature Date/Time: 05/02/2024/10:59:44 AM    Final    DG Chest Port 1 View Result Date: 05/01/2024 CLINICAL DATA:  cp EXAM: PORTABLE CHEST 1 VIEW COMPARISON:  Chest x-ray 02/16/2023, CT cardiac 03/01/2023 FINDINGS: The heart and mediastinal contours are within normal limits. No focal consolidation. No pulmonary edema. No pleural effusion. No pneumothorax. No acute osseous abnormality. IMPRESSION: No active disease. Electronically Signed   By: Morgane  Naveau M.D.   On: 05/01/2024 23:54    Results for orders placed or performed during the hospital encounter of 05/01/24 (from the past 48 hours)  Glucose, capillary     Status: Abnormal   Collection Time: 05/11/24 12:40 PM  Result Value Ref Range   Glucose-Capillary 158 (H) 70 - 99 mg/dL    Comment: Glucose reference range applies only to samples taken after fasting for at least 8 hours.   Comment 1 Notify RN    Comment 2 Document in Chart   Glucose, capillary     Status: Abnormal   Collection Time: 05/11/24  4:08 PM  Result Value Ref Range   Glucose-Capillary 215 (H) 70 - 99 mg/dL    Comment: Glucose reference range applies only to samples taken after fasting for at least 8 hours.   Comment 1 Notify RN    Comment 2 Document in Chart   Glucose, capillary     Status: Abnormal   Collection Time: 05/11/24  9:32 PM  Result Value Ref Range   Glucose-Capillary 244 (H) 70 - 99 mg/dL    Comment: Glucose reference range applies  only to samples taken after fasting for at least 8 hours.   Comment 1 Notify RN    Comment 2 Document in Chart   Protime-INR     Status: Abnormal   Collection Time: 05/12/24  3:42 AM  Result Value Ref Range   Prothrombin Time 20.7 (H) 11.4 - 15.2 seconds   INR 1.7 (H) 0.8 - 1.2    Comment: (NOTE) INR goal varies based on device and disease states. Performed at [facility], [address], [place], [place] [address]   Basic metabolic panel with GFR     Status: Abnormal   Collection Time: 05/12/24  3:42 AM  Result Value Ref Range   Sodium 141 135 - 145 mmol/L   Potassium 4.0 3.5 - 5.1 mmol/L   Chloride 99 98 - 111 mmol/L   CO2 31 22 - 32 mmol/L   Glucose, Bld 151 (H) 70 - 99 mg/dL    Comment: Glucose reference range applies only to samples taken after fasting for at least 8 hours.   BUN 24 (H) 8 - 23 mg/dL   Creatinine, Ser 8.89 (H) 0.44 - 1.00 mg/dL   Calcium  9.8 8.9 - 10.3 mg/dL   GFR, Estimated 55 (L) >60 mL/min    Comment: (NOTE) Calculated using the CKD-EPI Creatinine Equation (2021)    Anion gap 11 5 - 15    Comment: Performed at [facility], [address] [place], [place] [address]  Glucose, capillary     Status: Abnormal   Collection Time: 05/12/24  6:38 AM  Result Value Ref Range   Glucose-Capillary 104 (H) 70 - 99 mg/dL    Comment: Glucose reference range applies only to samples taken after fasting for at least 8 hours.   Comment 1 Notify RN    Comment 2 Document in Chart   CBC     Status: Abnormal   Collection Time: 05/12/24 12:03 PM  Result Value Ref Range   WBC 11.2 (H) 4.0 - 10.5 K/uL   RBC 3.65 (L) 3.87 - 5.11 MIL/uL   Hemoglobin 11.1 (L) 12.0 - 15.0 g/dL   HCT 65.3 (L) 63.9 - 53.9 %   MCV 94.8 80.0 - 100.0 fL   MCH 30.4 26.0 - 34.0 pg   MCHC 32.1 30.0 - 36.0 g/dL   RDW 85.8 88.4 - 84.4 %   Platelets 415 (H) 150 - 400 K/uL   nRBC 0.3 (H) 0.0 - 0.2 %    Comment: Performed at [facility], [address], [place], [place] [address]   Glucose, capillary     Status: Abnormal   Collection Time: 05/12/24 12:33 PM  Result Value Ref Range   Glucose-Capillary 168 (H) 70 - 99 mg/dL    Comment: Glucose reference range applies only to samples taken after fasting for at least 8 hours.  Glucose, capillary     Status: Abnormal   Collection Time: 05/12/24  4:38 PM  Result Value Ref Range   Glucose-Capillary 131 (H) 70 - 99 mg/dL    Comment: Glucose reference range applies only to samples taken after fasting for at least 8 hours.  Glucose, capillary     Status: Abnormal   Collection Time: 05/12/24  9:06 PM  Result Value Ref Range   Glucose-Capillary 127 (H) 70 - 99 mg/dL    Comment: Glucose reference range applies only to samples taken after fasting for at least 8 hours.   Comment 1 Notify RN    Comment 2 Document in Chart   Protime-INR     Status: Abnormal   Collection Time: 05/13/24  3:43 AM  Result Value Ref Range   Prothrombin Time 21.8 (H) 11.4 - 15.2 seconds   INR 1.8 (H) 0.8 - 1.2    Comment: (NOTE) INR goal varies based on device and disease states. Performed at [facility], [address], [place], [place] [address]   Glucose, capillary     Status: Abnormal   Collection Time: 05/13/24  6:21 AM  Result Value Ref Range   Glucose-Capillary 145 (H) 70 - 99 mg/dL    Comment: Glucose reference range applies only to samples taken after fasting for at least 8 hours.   Comment 1 Notify RN    Comment 2 Document in Chart     Treatments: surgery:   05/06/2024       Patient:  Nathanel HERO Stauber Pre-Op Dx: CAD HTN HLP DM   Post-op Dx:  same Procedure: Off pump CABG X 1.  LIMA LAD       Surgeon and Role:      * Lightfoot, Linnie KIDD, MD - Primary    DEWAINE MICAEL Cera, PA-C - assisting              Anesthesia  general  Discharge Exam: Blood pressure (!) 129/53, pulse (!) 56, temperature 98.3 F (36.8 C), temperature source Oral, resp. rate 17, height 5' 4 (1.626 m), weight 98.7 kg, SpO2  91%.  General appearance:  alert, cooperative, and no distress Heart: regular rate and rhythm Lungs: clear to auscultation bilaterally Abdomen: soft, non-tender; bowel sounds normal; no masses,  no organomegaly Extremities: edema trace Wound: clean and dry  Discharge Medications:  The patient has been discharged on:   1.Beta Blocker:  Yes [   ]                              No   [  n ]                              If No, reason: Bradycardia  2.Ace Inhibitor/ARB: Yes [   ]                                     No  [   n ]                                     If No, reason: Labile BP  3.Statin:   Yes [y]                  No  [   ]                  If No, reason:  4.Ecasa:  Yes  [ y  ]                  No   [   ]                  If No, reason:  Patient had ACS upon admission:y  Plavix /P2Y12 inhibitor: Yes [   ]                                      No  [ n  ] patient stent went down, also on aspirin  and Coumadin .     Discharge Instructions     Amb Referral to Cardiac Rehabilitation   Complete by: As directed    Diagnosis:  NSTEMI CABG     CABG X ___: 1   After initial evaluation and assessments completed: Virtual Based Care may be provided alone or in conjunction with Phase 2 Cardiac Rehab based on patient barriers.: Yes   Intensive Cardiac Rehabilitation (ICR) [facility] location only OR Traditional Cardiac Rehabilitation (TCR) *If criteria for ICR are not met will enroll in TCR ([facility] only): Yes      Allergies as of 05/13/2024       Reactions   Azithromycin Hives   Codeine Nausea Only   Atomoxetine Nausea And Vomiting   Atorvastatin Other (See Comments)   Restless legs   Lisinopril Nausea And Vomiting        Medication List     STOP taking these medications    clopidogrel  75 MG tablet Commonly known as: PLAVIX    cyclobenzaprine 10 MG tablet Commonly known as: FLEXERIL   indapamide  2.5 MG tablet Commonly known as: LOZOL    isosorbide  mononitrate 60 MG 24 hr tablet Commonly known as:  IMDUR    nitroGLYCERIN  0.4 MG SL tablet Commonly known as: NITROSTAT   TAKE these medications    Accu-Chek Softclix Lancets lancets Use as instructed to test blood glucose four times daily   acetaminophen  325 MG tablet Commonly known as: TYLENOL  Take 2 tablets (650 mg total) by mouth every 6 (six) hours as needed for headache or mild pain (pain score 1-3).   aspirin  EC 325 MG tablet Take 1 tablet (325 mg total) by mouth daily.   B-D ULTRAFINE III SHORT PEN 31G X 8 MM Misc Generic drug: Insulin  Pen Needle 1 each by Does not apply route as directed.   busPIRone  5 MG tablet Commonly known as: BUSPAR  Take 5 mg by mouth at bedtime.   citalopram  20 MG tablet Commonly known as: CELEXA  Take 1 tablet (20 mg total) by mouth every evening.   donepezil  5 MG tablet Commonly known as: ARICEPT  Take 5 mg by mouth every evening.   empagliflozin  25 MG Tabs tablet Commonly known as: Jardiance  Take 1 tablet (25 mg total) by mouth daily.   estradiol  1 MG tablet Commonly known as: ESTRACE  Take 1 mg by mouth daily.   fenofibrate  160 MG tablet Take 1 tablet (160 mg total) by mouth daily. What changed: Another medication with the same name was removed. Continue taking this medication, and follow the directions you see here.   FreeStyle Libre 2 Reader Espiridion USE TO CHECK GLUCOSE AS DIRECTED   FreeStyle Libre 2 Sensor Misc CHANGE SENSOR EVERY 14 DAYS USE  TO CHECK BLOOD GLUCOSE  CONTINUOUSLY   furosemide  40 MG tablet Commonly known as: Lasix  Take 1 tablet (40 mg total) by mouth daily.   gabapentin  100 MG capsule Commonly known as: Neurontin  Take 1 capsule (100 mg total) by mouth at bedtime.   Gemtesa  75 MG Tabs Generic drug: Vibegron  Take 1 tablet (75 mg total) by mouth daily.   glucose blood test strip 1 each by Other route as needed. Use as instructed to test blood glucose four times daily   insulin  lispro 100 UNIT/ML KwikPen Commonly known as: HumaLOG  KwikPen Inject  16-22 Units into the skin 3 (three) times daily before meals.   levocetirizine 5 MG tablet Commonly known as: XYZAL  Take 5 mg by mouth every evening.   levothyroxine  50 MCG tablet Commonly known as: SYNTHROID  TAKE 1 TABLET BY MOUTH DAILY  BEFORE BREAKFAST   Magnesium  Oxide 400 240 MG Pack Generic drug: Magnesium  Oxide Take 400 mg by mouth daily.   metFORMIN  500 MG tablet Commonly known as: GLUCOPHAGE  TAKE 1 TABLET BY MOUTH DAILY  WITH BREAKFAST What changed: when to take this   modafinil  100 MG tablet Commonly known as: PROVIGIL  Take 1 tablet (100 mg total) by mouth daily.   montelukast  10 MG tablet Commonly known as: SINGULAIR  Take 10 mg by mouth at bedtime.   MULTIVITAMIN ADULT PO Take 1 tablet by mouth daily.   OVER THE COUNTER MEDICATION Take 2 capsules by mouth daily. Omega XL   oxyCODONE  5 MG immediate release tablet Commonly known as: Oxy IR/ROXICODONE  Take 1 tablet (5 mg total) by mouth every 4 (four) hours as needed for severe pain (pain score 7-10).   Ozempic  (0.25 or 0.5 MG/DOSE) 2 MG/1.5ML Sopn Generic drug: Semaglutide (0.25 or 0.5MG /DOS) Inject 0.5 mg into the skin once a week.   pantoprazole  40 MG tablet Commonly known as: PROTONIX  TAKE 2 TABLETS EVERY DAY   potassium chloride  SA 20 MEQ tablet Commonly known as: KLOR-CON  M Take 1 tablet (20 mEq total) by mouth daily. Start taking on: May 14, 2024  rOPINIRole  2 MG tablet Commonly known as: REQUIP  Take 2 mg by mouth at bedtime.   rosuvastatin  40 MG tablet Commonly known as: Crestor  Take 1 tablet (40 mg total) by mouth daily.   Tresiba  FlexTouch 200 UNIT/ML FlexTouch Pen Generic drug: insulin  degludec Inject 110 Units into the skin at bedtime.   Vitamin D3 125 MCG (5000 UT) Caps Take 1 capsule (5,000 Units total) by mouth daily.   warfarin 5 MG tablet Commonly known as: COUMADIN  Take as directed. If you are unsure how to take this medication, talk to your nurse or doctor. Original  instructions: TAKE ONE-HALF TABLET BY MOUTH  DAILY EXCEPT 1 TABLET BY MOUTH  ON SUNDAYS, TUESDAYS, THURSDAYS, SATURDAYS OR AS DIRECTED What changed:  how much to take how to take this additional instructions               Durable Medical Equipment  (From admission, onward)           Start     Ordered   05/12/24 0937  For home use only DME oxygen  Once       Comments: Please do walk test in am 05/13/2024  Question Answer Comment  Length of Need 6 Months   Mode or (Route) Nasal cannula   Liters per Minute 2   Oxygen delivery system Gas      05/12/24 0936   05/10/24 0909  For home use only DME Bedside commode  Once       Question:  Patient needs a bedside commode to treat with the following condition  Answer:  Physical deconditioning   05/10/24 0909   05/10/24 0909  For home use only DME Walker rolling  Once       Question Answer Comment  Walker: With 5 Inch Wheels   Patient needs a walker to treat with the following condition Physical deconditioning      09 /24/25 9090            Follow-up Information     Shyrl Linnie KIDD, MD Follow up.   Specialty: Cardiothoracic Surgery Contact information: [address] [place] [address] [phone]         [facility]  Follow up.   Why: HHPT/OT arranged- they will contact you to schedule within 48hr post discharge Contact information: [address] [place] [address] [phone]         [facility] Follow up.   Why: (Adapt) rolling walker arranged- to be delivered to pt prior to discharge Contact information: [address] [place] [address] [phone]                 Signed:  Rocky Shad, PA-C  05/13/2024, 10:09 AM

## 2024-05-08 NOTE — Progress Notes (Signed)
      [address]       [place] [address]             [phone]                 2 Days Post-Op Procedure(s) (LRB): OFF PUMP CORONARY ARTERY BYPASS GRAFTING (CABG) TIMES ONE USING LEFT INTERNAL MAMMARY ARTERY (N/A) ECHOCARDIOGRAM, TRANSESOPHAGEAL, INTRAOPERATIVE (N/A)   Events: No events _______________________________________________________________ Vitals: BP (!) 129/53   Pulse 85   Temp 98.9 F (37.2 C) (Oral)   Resp 17   Ht 5' 4 (1.626 m)   Wt 102.2 kg   SpO2 96%   BMI 38.67 kg/m  Filed Weights   05/06/24 0551 05/07/24 0535 05/08/24 0600  Weight: 97.7 kg 106.8 kg 102.2 kg     - Neuro: alert NAD  - Cardiovascular: sinus  Drips: none.   CVP:  [11 mmHg-21 mmHg] 14 mmHg CO:  [2.7 L/min-8.7 L/min] 7.9 L/min CI:  [1.2 L/min/m2-3.9 L/min/m2] 3.5 L/min/m2  - Pulm: EWOB  ABG    Component Value Date/Time   PHART 7.347 (L) 05/07/2024 0209   PCO2ART 47.3 05/07/2024 0209   PO2ART 93 05/07/2024 0209   HCO3 25.9 05/07/2024 0209   TCO2 27 05/07/2024 0209   ACIDBASEDEF 1.0 05/06/2024 2042   O2SAT 97 05/07/2024 0209    - Abd: ND - Extremity: warm  .Intake/Output      09/21 0701 09/22 0700 09/22 0701 09/23 0700   P.O. 2160    I.V. (mL/kg) 136.9 (1.3)    IV Piggyback 300.9    Total Intake(mL/kg) 2597.8 (25.4)    Urine (mL/kg/hr) 1545 (0.6)    Blood     Chest Tube 160    Total Output 1705    Net +892.8            _______________________________________________________________ Labs:    Latest Ref Rng & Units 05/08/2024    4:15 AM 05/07/2024    5:00 PM 05/07/2024    5:59 AM  CBC  WBC 4.0 - 10.5 K/uL 15.0  17.3  12.9   Hemoglobin 12.0 - 15.0 g/dL 89.5  88.9  88.6   Hematocrit 36.0 - 46.0 % 32.9  34.5  34.8   Platelets 150 - 400 K/uL 217  241  216       Latest Ref Rng & Units 05/08/2024    4:15 AM 05/07/2024    5:00 PM 05/07/2024    5:59 AM  CMP  Glucose 70 - 99 mg/dL 767  728  837   BUN 8 - 23 mg/dL 26  23  20    Creatinine  0.44 - 1.00 mg/dL 8.52  8.54  8.74   Sodium 135 - 145 mmol/L 131  130  135   Potassium 3.5 - 5.1 mmol/L 4.5  4.8  4.4   Chloride 98 - 111 mmol/L 98  98  100   CO2 22 - 32 mmol/L 24  19  24    Calcium  8.9 - 10.3 mg/dL 89.9  9.5  9.1     CXR: -  _______________________________________________________________  Assessment and Plan: POD 2 s/p CABG  Neuro: pain controlled CV: on A/S/BB.  Will remove wires Pulm: IS, ambulation Renal: creat up slightly.  Lasix  challenge today GI: on diet Heme: stable ID: [email or web address] Endo: SSI Dispo: continue ICU care   Rachel Odonnell 05/08/2024 7:42 AM

## 2024-05-08 NOTE — Telephone Encounter (Signed)
 Patient Product/process development scientist completed.    The patient is insured through [facility]. Patient has Medicare and is not eligible for a copay card, but may be able to apply for patient assistance or Medicare RX Payment Plan (Patient Must reach out to their plan, if eligible for payment plan), if available.    Ran test claim for Eliquis 5 mg and the current 30 day co-pay is $47.00.  Ran test claim for Xarelto 20 mg and the current 30 day co-pay is $47.00.  This test claim was processed through [facility]- copay amounts may vary at other pharmacies due to pharmacy/plan contracts, or as the patient moves through the different stages of their insurance plan.     Roland Earl, CPHT Pharmacy Technician III Certified Patient Advocate [facility] Patient Advocate Team Direct Number: [phone]  Fax: [phone]

## 2024-05-08 NOTE — Progress Notes (Signed)
 Physical Therapy Treatment Patient Details Name: Rachel Odonnell MRN: [id number] DOB: Apr 07, 1956 Today's Date: 05/08/2024   History of Present Illness 68 year old woman Admitted with ACS and a non-STEMI.  Repeat cardiac catheterization 05/04/2024 showed severe one-vessel CAD due to in-stent restenosis in the LAD.  Echocardiogram with intact LV function and mild AS.  She went for one-vessel CABG on 05/06/2024; former smoker, history of CAD (PTCI), with type 2 diabetes, hyperlipidemia, hypertension, paroxysmal atrial fibrillation, ADD with anxiety and depression, OSA    PT Comments  Limited visit due to hypotension with tranfers (see below,) + dizziness. Required up to min assist+2 for transfer training. States she was able to walk a lap around unit with staff this morning. Anticipate good functional recovery once BP stabilizes. RN present, aware of hypotension. Reviewed precautions and LE exercises. Husband present and supportive. Patient will continue to benefit from skilled physical therapy services to further improve independence with functional mobility.     05/08/24 1300  Orthostatic Lying   BP- Lying 103/66  Pulse- Lying 87  Orthostatic Sitting  BP- Sitting 91/45  Pulse- Sitting 81  Orthostatic Standing at 0 minutes  BP- Standing at 0 minutes (!) 82/50  Pulse- Standing at 0 minutes 88       If plan is discharge home, recommend the following: A little help with walking and/or transfers;A little help with bathing/dressing/bathroom;Assistance with cooking/housework;Help with stairs or ramp for entrance   Can travel by private vehicle        Equipment Recommendations  Rolling walker (2 wheels);BSC/3in1;Other (comment) (Can consider tub transfer bench; Pt seems to prefer to use her bathroom that has a tub)    Recommendations for Other Services       Precautions / Restrictions Precautions Precautions: Fall Recall of Precautions/Restrictions: Intact Precaution/Restrictions  Comments: sternal precautions Restrictions Weight Bearing Restrictions Per Provider Order: Yes RUE Weight Bearing Per Provider Order: Non weight bearing LUE Weight Bearing Per Provider Order: Non weight bearing Other Position/Activity Restrictions: Sternal Prec     Mobility  Bed Mobility Overal bed mobility: Needs Assistance Bed Mobility: Supine to Sit     Supine to sit: Mod assist     General bed mobility comments: pt hugging heart pillow to transition from supine to sitting with HOB 50 degrees. pt with (A) to lift trunk from bed surface, cues for technique.    Transfers Overall transfer level: Needs assistance Equipment used: 2 person hand held assist Transfers: Sit to/from Stand, Bed to chair/wheelchair/BSC Sit to Stand: +2 physical assistance, Min assist   Step pivot transfers: Min assist, +2 physical assistance       General transfer comment: Min assist +2 for stand and several small steps to pivot to recliner. Cues for technique, equipment management. holding pillow. Assisted with control to descend into chair. + Dizziness (see orthostatics).    Ambulation/Gait               General Gait Details: deferred due to hypotension   Stairs             Wheelchair Mobility     Tilt Bed    Modified Rankin (Stroke Patients Only)       Balance Overall balance assessment: Needs assistance Sitting-balance support: Feet supported, No upper extremity supported Sitting balance-Leahy Scale: Fair     Standing balance support: Bilateral upper extremity supported, During functional activity, Reliant on assistive device for balance Standing balance-Leahy Scale: Poor  Communication Communication Communication: No apparent difficulties  Cognition Arousal: Alert Behavior During Therapy: WFL for tasks assessed/performed   PT - Cognitive impairments: No apparent impairments                         Following  commands: Intact      Cueing Cueing Techniques: Verbal cues, Gestural cues  Exercises General Exercises - Lower Extremity Ankle Circles/Pumps: AROM, Both, 10 reps, Seated Quad Sets: Strengthening, Both, 10 reps, Seated Gluteal Sets: Strengthening, Both, 10 reps, Seated    General Comments General comments (skin integrity, edema, etc.): SpO2 92% and greater on 3L. See orthostatics tab      Pertinent Vitals/Pain Pain Assessment Pain Assessment: Faces Faces Pain Scale: Hurts little more Pain Location: Sternum Pain Descriptors / Indicators: Aching, Operative site guarding, Sore Pain Intervention(s): Monitored during session, Repositioned, Limited activity within patient's tolerance    Home Living Family/patient expects to be discharged to:: Private residence Living Arrangements: Spouse/significant other Available Help at Discharge: Family Type of Home: House Home Access: Level entry       Home Layout: One level Home Equipment: Adaptive equipment;Cane - single point;Shower seat - built in Additional Comments: no animals    Prior Function            PT Goals (current goals can now be found in the care plan section) Acute Rehab PT Goals Patient Stated Goal: get home; and get back to the beach when she can PT Goal Formulation: With patient Time For Goal Achievement: 05/21/24 Potential to Achieve Goals: Good Progress towards PT goals: Progressing toward goals    Frequency    Min 3X/week      PT Plan      Co-evaluation PT/OT/SLP Co-Evaluation/Treatment: Yes Reason for Co-Treatment: For patient/therapist safety;To address functional/ADL transfers;Complexity of the patient's impairments (multi-system involvement) PT goals addressed during session: Mobility/safety with mobility;Balance;Strengthening/ROM OT goals addressed during session: ADL's and self-care      AM-PAC PT 6 Clicks Mobility   Outcome Measure  Help needed turning from your back to your side  while in a flat bed without using bedrails?: A Little Help needed moving from lying on your back to sitting on the side of a flat bed without using bedrails?: A Lot Help needed moving to and from a bed to a chair (including a wheelchair)?: A Lot Help needed standing up from a chair using your arms (e.g., wheelchair or bedside chair)?: A Lot Help needed to walk in hospital room?: A Little Help needed climbing 3-5 steps with a railing? : A Lot 6 Click Score: 14    End of Session Equipment Utilized During Treatment: Oxygen Activity Tolerance: Patient tolerated treatment well;Treatment limited secondary to medical complications (Comment) (orthostasis) Patient left: in chair;with call bell/phone within reach;with family/visitor present;with chair alarm set;with nursing/sitter in room;with SCD's reapplied Nurse Communication: Mobility status PT Visit Diagnosis: Unsteadiness on feet (R26.81);Muscle weakness (generalized) (M62.81);Other abnormalities of gait and mobility (R26.89);Difficulty in walking, not elsewhere classified (R26.2)     Time: 1351-1411 PT Time Calculation (min) (ACUTE ONLY): 20 min  Charges:    $Therapeutic Activity: 8-22 mins PT General Charges $$ ACUTE PT VISIT: 1 Visit                     Leontine Roads, PT, DPT [facility]  Rehabilitation Services Physical Therapist Office: [phone] Website: [email or web address]    Leontine GORMAN Roads 05/08/2024, 3:21 PM

## 2024-05-08 NOTE — Consult Note (Addendum)
 NAME:  Rachel Odonnell, MRN:  [id number], DOB:  03-15-1956, LOS: 6 ADMISSION DATE:  05/01/2024, CONSULTATION DATE: 05/06/2024 REFERRING MD: Dr. Shyrl, REASON FOR CONSUL.TATION: Postoperative ventilator management  History of Present Illness:  68 year old woman, former smoker, history of CAD (PTCI), with type 2 diabetes, hyperlipidemia, hypertension, paroxysmal atrial fibrillation, ADD with anxiety and depression, OSA.  Admitted with ACS and a non-STEMI.  Repeat cardiac catheterization 05/04/2024 showed severe one-vessel CAD due to in-stent restenosis in the LAD.  Echocardiogram with intact LV function and mild AS.  She went for one-vessel CABG on 05/06/2024.  Admitted to the ICU postoperatively intubated and sedated.   Pump time: 0 Cell saver: 0 EBL: 350 cc UOP: 500 cc  Pertinent  Medical History   Past Medical History:  Diagnosis Date   ADD (attention deficit disorder)    Diabetes mellitus without complication (HCC)    Hypertension     Significant Hospital Events: Including procedures, antibiotic start and stop dates in addition to other pertinent events   9/20 CABG x 1 9/21 Phenylephrine  weaned off, pacer set to back up   Interim History / Subjective:  Neo weaned off. Pacer set to back up. Patient feels ok this morning. She is ambulating and does still endorse some dizzienss with ambulation.   Objective    Blood pressure (!) 129/53, pulse 85, temperature 97.8 F (36.6 C), temperature source Oral, resp. rate 17, height 5' 4 (1.626 m), weight 102.2 kg, SpO2 96%. CVP:  [11 mmHg-18 mmHg] 14 mmHg CO:  [2.7 L/min-8.7 L/min] 7.9 L/min CI:  [1.2 L/min/m2-3.9 L/min/m2] 3.5 L/min/m2      Intake/Output Summary (Last 24 hours) at 05/08/2024 0857 Last data filed at 05/08/2024 0700 Gross per 24 hour  Intake 2566.43 ml  Output 1550 ml  Net 1016.43 ml   Filed Weights   05/06/24 0551 05/07/24 0535 05/08/24 0600  Weight: 97.7 kg 106.8 kg 102.2 kg    Examination: General:  Overweight woman, no acute distress HENT: Moist mucous membranes, sclera anicteric  Lungs: Breathing comfortably on nasal cannula, clear to auscultation, diminished bilateral bases Cardiovascular: Irregularly irregular, normal S1 and S2, no m/r/g, dressing clean/dry/intact Abdomen: Obese, taut, non-tender, normoactive bowel sounds Extremities: Warm, no edema  Neuro: Alert and oriented x 3, follows commands throughout with good strength  GU: deferred  Resolved problem list   Assessment and Plan   CAD s/p CABGx1 on 9/20 - Post-op management per TCTS - D/c pacing wires today - Continue to monitor mediastinal and left pleural drain output, management per TCTS  - Continue to ASA, BB and statin - Consider diuresis   Acute post-operative pain, controlled - Continue scheduled APAP - PRN tramadol , oxycodone  and morphine    History of paroxysmal atrial fibrillation Currently in rate controlled Afib on telemetry with adequate blood pressure. - Continue BB per above - On warfarin at home, consider heparin  bridge and resumption of home warfarin per TCTS  HLD - Continue statin and fenofibrate    History of tobacco, question COPD Allergic rhinitis Extubated to BiPAP on 9/20. Now breathing comfortably on nasal cannula.  - Wean oxygen as able to maintain SpO2>92% - Continue to encourage pulmonary hygiene, IS, mobilization, adequate pain control chest tubes in place - Albuterol  PRN - Continue home montelukast , levocetirizine  AKI Baseline Cr appears to be ~1-1.2, currently 1.47.  - Consider diuresis - Renally dose medications, avoid nephrotoxins and ensure adequate renal perfusion - Continue to trend BMP  Leukocytosis, improving  Post-operative fever No localizing evidence of  infection, most likely reactive. WBC improving and no longer febrile. - Continue to monitor daily CBC  - Completed post-op antibiotics, no indication for further antimicrobial therapy at this time   Diabetes  mellitus with hyperglycemia, hemoglobin A1c 9.2 Diabetic neuropathy - Start glargine 25 units daily, continue SSI. May need meal time coverage  - Holding home metformin  and jardiance  in post-op setting  - Continue home nightly gabapentin   OSA - Home CPAP nightly   Hypothyroidism - Continue home Synthroid   Restless leg syndrome - Continue home Requip    GERD - Continue home PPI  ADD, anxiety/depression - Continue home Aricept , Provigil , Buspar , and Celexa   Labs   CBC: Recent Labs  Lab 05/06/24 1055 05/06/24 1106 05/06/24 1745 05/06/24 2042 05/07/24 0209 05/07/24 0559 05/07/24 1700 05/08/24 0415  WBC 13.8*  --  12.7*  --   --  12.9* 17.3* 15.0*  HGB 12.8   < > 12.1 12.2 11.6* 11.3* 11.0* 10.4*  HCT 38.0   < > 36.8 36.0 34.0* 34.8* 34.5* 32.9*  MCV 91.8  --  92.5  --   --  95.9 96.6 96.8  PLT 275  --  274  --   --  216 241 217   < > = values in this interval not displayed.    Basic Metabolic Panel: Recent Labs  Lab 05/06/24 0203 05/06/24 0809 05/06/24 0955 05/06/24 1106 05/06/24 1745 05/06/24 2042 05/07/24 0209 05/07/24 0559 05/07/24 1700 05/08/24 0415  NA 137   < > 136   < > 136 138 137 135 130* 131*  K 3.8   < > 3.9   < > 4.6 5.1 4.7 4.4 4.8 4.5  CL 101   < > 101  --  103  --   --  100 98 98  CO2 24  --   --   --  24  --   --  24 19* 24  GLUCOSE 178*   < > 189*  --  153*  --   --  162* 271* 232*  BUN 19   < > 21  --  20  --   --  20 23 26*  CREATININE 1.04*   < > 1.00  --  1.09*  --   --  1.25* 1.45* 1.47*  CALCIUM  9.8  --   --   --  8.9  --   --  9.1 9.5 10.0  MG  --   --   --   --  2.7*  --   --  2.2 2.2 2.3  PHOS  --   --   --   --   --   --   --   --   --  2.9   < > = values in this interval not displayed.   GFR: Estimated Creatinine Clearance: 42.6 mL/min (A) (by C-G formula based on SCr of 1.47 mg/dL (H)). Recent Labs  Lab 05/06/24 1745 05/06/24 1747 05/07/24 0559 05/07/24 1700 05/08/24 0415  WBC 12.7*  --  12.9* 17.3* 15.0*   LATICACIDVEN  --  1.1  --   --   --     Liver Function Tests: No results for input(s): AST, ALT, ALKPHOS, BILITOT, PROT, ALBUMIN  in the last 168 hours. No results for input(s): LIPASE, AMYLASE in the last 168 hours. No results for input(s): AMMONIA in the last 168 hours.  ABG    Component Value Date/Time   PHART 7.347 (L) 05/07/2024 0209   PCO2ART 47.3 05/07/2024 0209  PO2ART 93 05/07/2024 0209   HCO3 25.9 05/07/2024 0209   TCO2 27 05/07/2024 0209   ACIDBASEDEF 1.0 05/06/2024 2042   O2SAT 97 05/07/2024 0209     Coagulation Profile: Recent Labs  Lab 05/03/24 0350 05/03/24 0915 05/04/24 0847 05/05/24 0321 05/06/24 1055  INR 2.8* 2.7* 1.9* 1.5* 1.5*    Cardiac Enzymes: No results for input(s): CKTOTAL, CKMB, CKMBINDEX, TROPONINI in the last 168 hours.  HbA1C: Hemoglobin A1C  Date/Time Value Ref Range Status  09/25/2021 12:00 AM 10.3  Final   HbA1c, POC (controlled diabetic range)  Date/Time Value Ref Range Status  07/14/2023 10:20 AM 10.6 (A) 0.0 - 7.0 % Final  03/04/2023 01:09 PM 10.2 (A) 0.0 - 7.0 % Final   Hgb A1c MFr Bld  Date/Time Value Ref Range Status  05/05/2024 03:21 AM 9.2 (H) 4.8 - 5.6 % Final    Comment:    (NOTE)         Prediabetes: 5.7 - 6.4         Diabetes: >6.4         Glycemic control for adults with diabetes: <7.0   04/16/2023 02:53 PM 11.4 (H) 4.8 - 5.6 % Final    Comment:    (NOTE)         Prediabetes: 5.7 - 6.4         Diabetes: >6.4         Glycemic control for adults with diabetes: <7.0     CBG: Recent Labs  Lab 05/07/24 1600 05/07/24 2025 05/07/24 2313 05/08/24 0415 05/08/24 0755  GLUCAP 187* 258* 255* 237* 223*         Rexene LOISE Tanda DEVONNA [facility] 05/08/24 9:24 AM  Please see [email or web address] for pager details.  From 7A-7P if no response, please call [phone] After hours, please call ELink [phone]

## 2024-05-08 NOTE — Evaluation (Signed)
 Occupational Therapy Evaluation Patient Details Name: Rachel Odonnell MRN: [id number] DOB: [birth year] Today's Date: 05/08/2024   History of Present Illness   68 year old woman Admitted with ACS and a non-STEMI.  Repeat cardiac catheterization 05/04/2024 showed severe one-vessel CAD due to in-stent restenosis in the LAD.  Echocardiogram with intact LV function and mild AS.  She went for one-vessel CABG on 05/06/2024; former smoker, history of CAD (PTCI), with type 2 diabetes, hyperlipidemia, hypertension, paroxysmal atrial fibrillation, ADD with anxiety and depression, OSA     Clinical Impressions Patient is s/p CABG surgery resulting in functional limitations due to the deficits listed below (see OT problem list). Pt at baseline indep and goes to the gym. Pt at this time with orthostatic BP limiting progression of therapy. Pt educated on sternal precautions and use of the Tube Patient will benefit from skilled OT acutely to increase independence and safety with ADLS to allow discharge outpatient OT.      If plan is discharge home, recommend the following:   A little help with walking and/or transfers;A little help with bathing/dressing/bathroom     Functional Status Assessment   Patient has had a recent decline in their functional status and demonstrates the ability to make significant improvements in function in a reasonable and predictable amount of time.     Equipment Recommendations   Other (comment) (TBA- likely RW)     Recommendations for Other Services         Precautions/Restrictions   Precautions Precautions: Fall Recall of Precautions/Restrictions: Intact Precaution/Restrictions Comments: sternal precautions Restrictions Weight Bearing Restrictions Per Provider Order: Yes RUE Weight Bearing Per Provider Order: Non weight bearing LUE Weight Bearing Per Provider Order: Non weight bearing Other Position/Activity Restrictions: Sternal Prec     Mobility Bed  Mobility Overal bed mobility: Needs Assistance Bed Mobility: Supine to Sit     Supine to sit: Mod assist     General bed mobility comments: pt hugging heart pillow to transition from supine to sitting with HOB 50 degrees. pt with (A) to lift trunk from bed surface    Transfers Overall transfer level: Needs assistance Equipment used: 2 person hand held assist Transfers: Sit to/from Stand, Bed to chair/wheelchair/BSC Sit to Stand: +2 physical assistance, Min assist Stand pivot transfers: +2 physical assistance, Min assist         General transfer comment: pt orthostatic BP transfered to recliner with SCD and legs elevated      Balance Overall balance assessment: Needs assistance Sitting-balance support: Bilateral upper extremity supported, Feet supported Sitting balance-Leahy Scale: Fair     Standing balance support: Bilateral upper extremity supported, During functional activity, Reliant on assistive device for balance Standing balance-Leahy Scale: Poor                             ADL either performed or assessed with clinical judgement   ADL Overall ADL's : Needs assistance/impaired     Grooming: Contact guard assist;Sitting   Upper Body Bathing: Moderate assistance;Sitting   Lower Body Bathing: Maximal assistance;Sit to/from stand           Toilet Transfer: +2 for physical assistance;Minimal assistance;Stand-pivot;BSC/3in1                   Vision Baseline Vision/History: 1 Wears glasses Vision Assessment?: No apparent visual deficits     Perception         Praxis  Pertinent Vitals/Pain Pain Assessment Pain Assessment: Faces Faces Pain Scale: Hurts little more Pain Location: Sternum Pain Descriptors / Indicators: Aching, Operative site guarding, Sore Pain Intervention(s): Monitored during session, Premedicated before session, Repositioned, Limited activity within patient's tolerance     Extremity/Trunk Assessment  Upper Extremity Assessment Upper Extremity Assessment: Generalized weakness   Lower Extremity Assessment Lower Extremity Assessment: Generalized weakness   Cervical / Trunk Assessment Cervical / Trunk Assessment: Other exceptions (sternal precautions)   Communication Communication Communication: No apparent difficulties   Cognition Arousal: Alert Behavior During Therapy: WFL for tasks assessed/performed Cognition: Cognition impaired             OT - Cognition Comments: requires reinforcement of sternal precautions and repeating information during session for patient                 Following commands: Intact       Cueing  General Comments   Cueing Techniques: Verbal cues;Gestural cues  BP orthostatic see vitals, 3L [place]   Exercises Exercises: Other exercises Other Exercises Other Exercises: ankle pumps, elbow flexion for blood pressure support x20 reps   Shoulder Instructions      Home Living Family/patient expects to be discharged to:: Private residence Living Arrangements: Spouse/significant other Available Help at Discharge: Family Type of Home: House Home Access: Level entry     Home Layout: One level     Bathroom Shower/Tub: Tub/shower unit;Walk-in shower   Bathroom Toilet: Standard Bathroom Accessibility: No   Home Equipment: Adaptive equipment;Cane - single point;Shower seat - built in [employer]: [place]acher Additional Comments: no animals      Prior Functioning/Environment Prior Level of Function : Independent/Modified Independent                    OT Problem List: Decreased strength;Decreased activity tolerance;Impaired balance (sitting and/or standing);Decreased cognition;Decreased safety awareness;Decreased knowledge of use of DME or AE;Decreased knowledge of precautions;Cardiopulmonary status limiting activity   OT Treatment/Interventions: Self-care/ADL training;Therapeutic exercise;Energy conservation;DME and/or AE  instruction;Manual therapy;Modalities;Therapeutic activities;Cognitive remediation/compensation;Patient/family education;Balance training      OT Goals(Current goals can be found in the care plan section)   Acute Rehab OT Goals Patient Stated Goal: patient walks to walk again- normally goes to gym but reports lately not feeling like it OT Goal Formulation: With patient/family Time For Goal Achievement: 05/22/24 Potential to Achieve Goals: Good   OT Frequency:  Min 2X/week    Co-evaluation PT/OT/SLP Co-Evaluation/Treatment: Yes Reason for Co-Treatment: For patient/therapist safety;To address functional/ADL transfers   OT goals addressed during session: ADL's and self-care      AM-PAC OT 6 Clicks Daily Activity     Outcome Measure Help from another person eating meals?: A Little Help from another person taking care of personal grooming?: A Little Help from another person toileting, which includes using toliet, bedpan, or urinal?: A Lot Help from another person bathing (including washing, rinsing, drying)?: A Lot Help from another person to put on and taking off regular upper body clothing?: A Little Help from another person to put on and taking off regular lower body clothing?: A Lot 6 Click Score: 15   End of Session Equipment Utilized During Treatment: Oxygen Nurse Communication: Mobility status;Precautions  Activity Tolerance: Patient tolerated treatment well Patient left: in chair;with call bell/phone within reach;with chair alarm set;with nursing/sitter in room;with family/visitor present;with SCD's reapplied  OT Visit Diagnosis: Unsteadiness on feet (R26.81);Muscle weakness (generalized) (M62.81)  Time: 1350-1411 OT Time Calculation (min): 21 min Charges:  OT General Charges $OT Visit: 1 Visit OT Evaluation $OT Eval Moderate Complexity: 1 Mod   Brynn, OTR/L  Acute Rehabilitation Services Office: [phone] .   Rachel Odonnell 05/08/2024, 2:28  PM

## 2024-05-09 DIAGNOSIS — Z951 Presence of aortocoronary bypass graft: Secondary | ICD-10-CM | POA: Diagnosis not present

## 2024-05-09 DIAGNOSIS — I480 Paroxysmal atrial fibrillation: Secondary | ICD-10-CM | POA: Diagnosis not present

## 2024-05-09 DIAGNOSIS — E1165 Type 2 diabetes mellitus with hyperglycemia: Secondary | ICD-10-CM | POA: Diagnosis not present

## 2024-05-09 DIAGNOSIS — I951 Orthostatic hypotension: Secondary | ICD-10-CM

## 2024-05-09 LAB — PROTIME-INR
INR: 1.1 (ref 0.8–1.2)
Prothrombin Time: 14.5 s (ref 11.4–15.2)

## 2024-05-09 LAB — ECHO INTRAOPERATIVE TEE
AR max vel: 1.31 cm2
AV Area VTI: 1.24 cm2
AV Area mean vel: 1.28 cm2
AV Mean grad: 10 mmHg
AV Peak grad: 18.1 mmHg
Ao pk vel: 2.13 m/s
Area-P 1/2: 3.58 cm2
Height: 64 in
MV VTI: 2.56 cm2
Weight: 3446.23 [oz_av]

## 2024-05-09 LAB — BPAM RBC
Blood Product Expiration Date: 202510182359
Unit Type and Rh: 5100

## 2024-05-09 LAB — BASIC METABOLIC PANEL WITH GFR
Anion gap: 10 (ref 5–15)
Anion gap: 11 (ref 5–15)
BUN: 28 mg/dL — ABNORMAL HIGH (ref 8–23)
BUN: 28 mg/dL — ABNORMAL HIGH (ref 8–23)
CO2: 24 mmol/L (ref 22–32)
CO2: 24 mmol/L (ref 22–32)
Calcium: 10.5 mg/dL — ABNORMAL HIGH (ref 8.9–10.3)
Calcium: 10.7 mg/dL — ABNORMAL HIGH (ref 8.9–10.3)
Chloride: 98 mmol/L (ref 98–111)
Chloride: 98 mmol/L (ref 98–111)
Creatinine, Ser: 1.19 mg/dL — ABNORMAL HIGH (ref 0.44–1.00)
Creatinine, Ser: 1.08 mg/dL — ABNORMAL HIGH (ref 0.44–1.00)
GFR, Estimated: 50 mL/min — ABNORMAL LOW (ref >60)
GFR, Estimated: 56 mL/min — ABNORMAL LOW (ref >60)
Glucose, Bld: 262 mg/dL — ABNORMAL HIGH (ref 70–99)
Glucose, Bld: 224 mg/dL — ABNORMAL HIGH (ref 70–99)
Potassium: 4.5 mmol/L (ref 3.5–5.1)
Potassium: 4.2 mmol/L (ref 3.5–5.1)
Sodium: 132 mmol/L — ABNORMAL LOW (ref 135–145)
Sodium: 133 mmol/L — ABNORMAL LOW (ref 135–145)

## 2024-05-09 LAB — GLUCOSE, CAPILLARY
Glucose-Capillary: 187 mg/dL — ABNORMAL HIGH (ref 70–99)
Glucose-Capillary: 188 mg/dL — ABNORMAL HIGH (ref 70–99)
Glucose-Capillary: 251 mg/dL — ABNORMAL HIGH (ref 70–99)
Glucose-Capillary: 258 mg/dL — ABNORMAL HIGH (ref 70–99)
Glucose-Capillary: 261 mg/dL — ABNORMAL HIGH (ref 70–99)
Glucose-Capillary: 264 mg/dL — ABNORMAL HIGH (ref 70–99)

## 2024-05-09 LAB — CBC
HCT: 30.9 % — ABNORMAL LOW (ref 36.0–46.0)
Hemoglobin: 10 g/dL — ABNORMAL LOW (ref 12.0–15.0)
MCH: 30.7 pg (ref 26.0–34.0)
MCHC: 32.4 g/dL (ref 30.0–36.0)
MCV: 94.8 fL (ref 80.0–100.0)
Platelets: 240 K/uL (ref 150–400)
RBC: 3.26 MIL/uL — ABNORMAL LOW (ref 3.87–5.11)
RDW: 13.4 % (ref 11.5–15.5)
WBC: 13 K/uL — ABNORMAL HIGH (ref 4.0–10.5)
nRBC: 0 % (ref 0.0–0.2)

## 2024-05-09 LAB — TYPE AND SCREEN
ABO/RH(D): O POS
Antibody Screen: NEGATIVE
Unit division: 0

## 2024-05-09 LAB — MAGNESIUM: Magnesium: 2.1 mg/dL (ref 1.7–2.4)

## 2024-05-09 MED ORDER — ~~LOC~~ CARDIAC SURGERY, PATIENT & FAMILY EDUCATION: Freq: Once | Status: AC

## 2024-05-09 MED ORDER — SODIUM CHLORIDE 0.9 % IV SOLN: 250.0000 mL | INTRAVENOUS | Status: AC | PRN

## 2024-05-09 MED ORDER — INSULIN GLARGINE 100 UNIT/ML ~~LOC~~ SOLN
35.0000 [IU] | Freq: Two times a day (BID) | SUBCUTANEOUS | Status: DC
  Administered 2024-05-09 – 2024-05-13 (×9): 35 [IU] via SUBCUTANEOUS
  Filled 2024-05-09 (×10): qty 0.35

## 2024-05-09 MED ORDER — FENTANYL CITRATE PF 50 MCG/ML IJ SOSY: 25.0000 ug | PREFILLED_SYRINGE | Freq: Once | INTRAMUSCULAR | Status: DC

## 2024-05-09 MED ORDER — ENOXAPARIN SODIUM 40 MG/0.4ML IJ SOSY
40.0000 mg | PREFILLED_SYRINGE | Freq: Every day | INTRAMUSCULAR | Status: DC
  Administered 2024-05-09 – 2024-05-12 (×4): 40 mg via SUBCUTANEOUS
  Filled 2024-05-09 (×4): qty 0.4

## 2024-05-09 MED ORDER — WARFARIN - PHARMACIST DOSING INPATIENT
Freq: Every day | Status: DC
Start: 2024-05-09 — End: 2024-05-13

## 2024-05-09 MED ORDER — SODIUM CHLORIDE 0.9% FLUSH: 3.0000 mL | INTRAVENOUS | Status: DC | PRN

## 2024-05-09 MED ORDER — INSULIN ASPART 100 UNIT/ML IJ SOLN
0.0000 [IU] | Freq: Three times a day (TID) | INTRAMUSCULAR | Status: DC
  Administered 2024-05-09 (×3): 12 [IU] via SUBCUTANEOUS
  Administered 2024-05-10: 4 [IU] via SUBCUTANEOUS
  Administered 2024-05-10: 16 [IU] via SUBCUTANEOUS
  Administered 2024-05-10: 8 [IU] via SUBCUTANEOUS
  Administered 2024-05-10: 2 [IU] via SUBCUTANEOUS
  Administered 2024-05-11: 8 [IU] via SUBCUTANEOUS
  Administered 2024-05-11: 2 [IU] via SUBCUTANEOUS
  Administered 2024-05-11: 8 [IU] via SUBCUTANEOUS
  Administered 2024-05-12: 4 [IU] via SUBCUTANEOUS
  Administered 2024-05-12 (×2): 2 [IU] via SUBCUTANEOUS
  Administered 2024-05-13: 4 [IU] via SUBCUTANEOUS
  Administered 2024-05-13: 2 [IU] via SUBCUTANEOUS

## 2024-05-09 MED ORDER — WARFARIN SODIUM 5 MG PO TABS
5.0000 mg | ORAL_TABLET | Freq: Once | ORAL | Status: AC
  Administered 2024-05-09: 5 mg via ORAL
  Filled 2024-05-09: qty 1

## 2024-05-09 MED ORDER — SODIUM CHLORIDE 0.9% FLUSH
3.0000 mL | Freq: Two times a day (BID) | INTRAVENOUS | Status: DC
  Administered 2024-05-09 – 2024-05-12 (×8): 3 mL via INTRAVENOUS

## 2024-05-09 MED ORDER — INSULIN ASPART 100 UNIT/ML IJ SOLN
4.0000 [IU] | Freq: Three times a day (TID) | INTRAMUSCULAR | Status: DC
  Administered 2024-05-09 – 2024-05-13 (×7): 4 [IU] via SUBCUTANEOUS

## 2024-05-09 NOTE — Progress Notes (Signed)
      [address]       [place] [address]             [phone]                 3 Days Post-Op Procedure(s) (LRB): OFF PUMP CORONARY ARTERY BYPASS GRAFTING (CABG) TIMES ONE USING LEFT INTERNAL MAMMARY ARTERY (N/A) ECHOCARDIOGRAM, TRANSESOPHAGEAL, INTRAOPERATIVE (N/A)   Events: No events _______________________________________________________________ Vitals: BP (!) 120/50   Pulse (!) 59   Temp 97.6 F (36.4 C) (Axillary)   Resp 18   Ht 5' 4 (1.626 m)   Wt 101.6 kg   SpO2 96%   BMI 38.45 kg/m  Filed Weights   05/07/24 0535 05/08/24 0600 05/09/24 0500  Weight: 106.8 kg 102.2 kg 101.6 kg     - Neuro: alert NAD  - Cardiovascular: sinus  Drips: none.      - Pulm: EWOB  ABG    Component Value Date/Time   PHART 7.347 (L) 05/07/2024 0209   PCO2ART 47.3 05/07/2024 0209   PO2ART 93 05/07/2024 0209   HCO3 25.9 05/07/2024 0209   TCO2 27 05/07/2024 0209   ACIDBASEDEF 1.0 05/06/2024 2042   O2SAT 97 05/07/2024 0209    - Abd: ND - Extremity: warm  .Intake/Output      09/22 0701 09/23 0700 09/23 0701 09/24 0700   P.O. 480    I.V. (mL/kg) 6 (0.1)    IV Piggyback     Total Intake(mL/kg) 486 (4.8)    Urine (mL/kg/hr) 2080 (0.9)    Stool 0    Chest Tube     Total Output 2080    Net -1594         Stool Occurrence 2 x       _______________________________________________________________ Labs:    Latest Ref Rng & Units 05/08/2024    4:15 AM 05/07/2024    5:00 PM 05/07/2024    5:59 AM  CBC  WBC 4.0 - 10.5 K/uL 15.0  17.3  12.9   Hemoglobin 12.0 - 15.0 g/dL 89.5  88.9  88.6   Hematocrit 36.0 - 46.0 % 32.9  34.5  34.8   Platelets 150 - 400 K/uL 217  241  216       Latest Ref Rng & Units 05/08/2024   11:59 PM 05/08/2024    4:19 AM 05/08/2024    4:15 AM  CMP  Glucose 70 - 99 mg/dL 737  714  767   BUN 8 - 23 mg/dL 28  26  26    Creatinine 0.44 - 1.00 mg/dL 8.80  8.69  8.52   Sodium 135 - 145 mmol/L 132  131  131   Potassium 3.5 - 5.1  mmol/L 4.5  4.5  4.5   Chloride 98 - 111 mmol/L 98  97  98   CO2 22 - 32 mmol/L 24  26  24    Calcium  8.9 - 10.3 mg/dL 89.4  89.9  89.9     CXR: -  _______________________________________________________________  Assessment and Plan: POD 3 s/p CABG  Neuro: pain controlled CV: on A/S.  Holding BB of bradycardia Pulm: IS, ambulation Renal: creat down.  diuresing GI: on diet Heme: stable ID: [email or web address] Endo: SSI Dispo: floor   Rachel Odonnell 05/09/2024 9:37 AM

## 2024-05-09 NOTE — Inpatient Diabetes Management (Signed)
 Inpatient Diabetes Program Recommendations  AACE/ADA: New Consensus Statement on Inpatient Glycemic Control (2015)  Target Ranges:  Prepandial:   less than 140 mg/dL      Peak postprandial:   less than 180 mg/dL (1-2 hours)      Critically ill patients:  140 - 180 mg/dL   Lab Results  Component Value Date   GLUCAP 251 (H) 05/09/2024   HGBA1C 9.2 (H) 05/05/2024    Review of Glycemic Control  Latest Reference Range & Units 05/08/24 07:55 05/08/24 11:40 05/08/24 15:49 05/08/24 19:47 05/08/24 23:58 05/09/24 04:18 05/09/24 07:25 05/09/24 11:46  Glucose-Capillary 70 - 99 mg/dL 776 (H) 707 (H) 719 (H) 324 (H) 261 (H) 188 (H) 187 (H) 251 (H)  (H): Data is abnormally high  Diabetes history: DM2 Outpatient Diabetes medications: Tresiba  110 units at bedtime, Humalog  16-22 units TID, Jardiance  25 mg daily, Ozempic  0.5 mg Qweek, Metfrmin 500 mg QPM  Current orders for Inpatient glycemic control: Lantus  35 units BID, Novolog  0-24 units TID and HS  Inpatient Diabetes Program Recommendations:    Please consider adding meal coverage:  Novolog  4 units TID with meals if she consumes at least 50%.  Thank you, Wyvonna Pinal, MSN, CDCES Diabetes Coordinator Inpatient Diabetes Program [phone] (team pager from 8a-5p)

## 2024-05-09 NOTE — Progress Notes (Signed)
 PHARMACY - ANTICOAGULATION CONSULT NOTE  Pharmacy Consult for warfarin Indication: atrial fibrillation  Allergies  Allergen Reactions   Azithromycin Hives   Codeine Nausea Only   Atomoxetine Nausea And Vomiting   Atorvastatin Other (See Comments)    Restless legs   Lisinopril Nausea And Vomiting    Patient Measurements: Height: 5' 4 (162.6 cm) Weight: 101.6 kg (223 lb 15.8 oz) IBW/kg (Calculated) : 54.7 HEPARIN  DW (KG): 77.9  Vital Signs: Temp: 97.6 F (36.4 C) (09/23 0700) Temp Source: Axillary (09/23 0700) BP: 120/50 (09/23 0830) Pulse Rate: 59 (09/23 0830)  Labs: Recent Labs    05/06/24 1055 05/06/24 1106 05/07/24 1700 05/08/24 0415 05/08/24 0419 05/08/24 2359 05/09/24 0927  HGB 12.8   < > 11.0* 10.4*  --   --  10.0*  HCT 38.0   < > 34.5* 32.9*  --   --  30.9*  PLT 275   < > 241 217  --   --  240  APTT 31  --   --   --   --   --   --   LABPROT 18.6*  --   --   --   --   --   --   INR 1.5*  --   --   --   --   --   --   CREATININE  --    < > 1.45* 1.47* 1.30* 1.19*  --    < > = values in this interval not displayed.    Estimated Creatinine Clearance: 52.5 mL/min (A) (by C-G formula based on SCr of 1.19 mg/dL (H)).   Medical History: Past Medical History:  Diagnosis Date   ADD (attention deficit disorder)    Diabetes mellitus without complication (HCC)    Hypertension     Medications:  67 YOF on warfarin PTA for hx of atrial fibrillation presented with chest pain, NSTEMI. Warfarin held (last dose 9/15) with plans for cardiac cath. Patient was briefly on heparin  prior to cath earlier this admission. Patient now s/p CABG on 05/06/2024. Chest tubes and pacing wires have been removed. Pharmacy has been consulted for warfarin dosing.   PTA Warfarin: 2.5 mg on Monday and Thursdays, 5 mg EOD  9/23: INR 1.1, Hgb 10.0, PLT 240. Discussed with TCTS, will restart heparin  without a bridge today.   Goal of Therapy:  INR 2-3 Monitor platelets by anticoagulation  protocol: Yes   Plan:  Warfarin 5 mg x1 today Monitor CBC, INR, and s/sx of bleeding daily  Morna Breach, PharmD PGY2 Cardiology Pharmacy Resident 05/09/2024 12:24 PM

## 2024-05-09 NOTE — Progress Notes (Signed)
 NAME:  Rachel Odonnell, MRN:  [id number], DOB:  [birth year], LOS: 7 ADMISSION DATE:  05/01/2024, CONSULTATION DATE: 05/06/2024 REFERRING MD: Dr. Shyrl, REASON FOR CONSUL.TATION: Postoperative ventilator management  History of Present Illness:  68 year old woman, former smoker, history of CAD (PTCI), with type 2 diabetes, hyperlipidemia, hypertension, paroxysmal atrial fibrillation, ADD with anxiety and depression, OSA.  Admitted with ACS and a non-STEMI.  Repeat cardiac catheterization 05/04/2024 showed severe one-vessel CAD due to in-stent restenosis in the LAD.  Echocardiogram with intact LV function and mild AS.  She went for one-vessel CABG on 05/06/2024.  Admitted to the ICU postoperatively intubated and sedated.   Pump time: 0 Cell saver: 0 EBL: 350 cc UOP: 500 cc  Pertinent  Medical History   Past Medical History:  Diagnosis Date   ADD (attention deficit disorder)    Diabetes mellitus without complication (HCC)    Hypertension     Significant Hospital Events: Including procedures, antibiotic start and stop dates in addition to other pertinent events   9/20 CABG x 1 9/21 Phenylephrine  weaned off, pacer set to back up  9/22 Pacing wires removed, chest tubes removed  Interim History / Subjective:  Orthostatic while ambulating yesterday and dizzy with ambulation this morning. Diuresed well with 40 mg IV Lasix . Patient tells me she is not feeling well this morning. Has only been getting intermittent sleep and complains of nausea.  Objective    Blood pressure (!) 120/50, pulse (!) 59, temperature 97.6 F (36.4 C), temperature source Axillary, resp. rate 18, height 5' 4 (1.626 m), weight 101.6 kg, SpO2 96%.        Intake/Output Summary (Last 24 hours) at 05/09/2024 0847 Last data filed at 05/09/2024 0500 Gross per 24 hour  Intake 486 ml  Output 1980 ml  Net -1494 ml   Filed Weights   05/07/24 0535 05/08/24 0600 05/09/24 0500  Weight: 106.8 kg 102.2 kg 101.6 kg     Examination: General: Overweight woman, visibly uncomfortable  HENT: moist mucous membranes, sclera anicteric Lungs: breathing comfortably on nasal cannula, diminished bilateral bases Cardiovascular: regular rate and rhythm, normal S1 and S2 no m/r/g Abdomen: Obese, non-tender, non-distended, normoactive bowel sounds Extremities: warm, no edema Neuro: alert and oriented x 3, no focal deficits GU: deferred  Resolved problem list   Assessment and Plan   CAD s/p CABGx1 on 9/20 - Post-op management per TCTS - Continue to ASA, BB and statin - Would hold off on further diuresis today given orthostasis and not overloaded on exam  Orthostatic hypotension - Hold further diuresis - Encourage PO intake - Hold home nightly Requip  for now  Acute post-operative pain, controlled - Continue scheduled APAP - PRN tramadol , oxycodone  and morphine    Paroxysmal atrial fibrillation Currently NSR in the 60's but intermittent rate controlled Afib  - Continue BB per above - On warfarin at home, consider heparin  bridge and resumption of home warfarin per TCTS  HLD - Continue statin and fenofibrate    History of tobacco, question COPD Allergic rhinitis Extubated to BiPAP on 9/20. Now breathing comfortably on nasal cannula.  - Wean oxygen as able to maintain SpO2>92% - Continue to encourage pulmonary hygiene, IS, mobilization, adequate pain control chest tubes in place - Albuterol  PRN - Continue home montelukast , levocetirizine  AKI, resolved Baseline Cr appears to be ~1-1.2. Cr now 1.19.  - Hold diuresis per above - Renally dose medications, avoid nephrotoxins and ensure adequate renal perfusion - Continue to trend BMP  Diabetes mellitus with hyperglycemia, hemoglobin  A1c 9.2 Diabetic neuropathy - Continue glargine 25 units BID and SSI - Holding home metformin  and jardiance  in post-op setting  - Continue home nightly gabapentin   OSA - Home CPAP nightly   Hypothyroidism -  Continue home Synthroid   Restless leg syndrome - Holding home Requip  in the setting of orthostatic hypotension per above  GERD - Continue home PPI  ADD, anxiety/depression - Continue home Aricept , Provigil , Buspar , and Celexa   Labs   CBC: Recent Labs  Lab 05/06/24 1055 05/06/24 1106 05/06/24 1745 05/06/24 2042 05/07/24 0209 05/07/24 0559 05/07/24 1700 05/08/24 0415  WBC 13.8*  --  12.7*  --   --  12.9* 17.3* 15.0*  HGB 12.8   < > 12.1 12.2 11.6* 11.3* 11.0* 10.4*  HCT 38.0   < > 36.8 36.0 34.0* 34.8* 34.5* 32.9*  MCV 91.8  --  92.5  --   --  95.9 96.6 96.8  PLT 275  --  274  --   --  216 241 217   < > = values in this interval not displayed.    Basic Metabolic Panel: Recent Labs  Lab 05/07/24 0559 05/07/24 1700 05/08/24 0415 05/08/24 0419 05/08/24 2359  NA 135 130* 131* 131* 132*  K 4.4 4.8 4.5 4.5 4.5  CL 100 98 98 97* 98  CO2 24 19* 24 26 24   GLUCOSE 162* 271* 232* 285* 262*  BUN 20 23 26* 26* 28*  CREATININE 1.25* 1.45* 1.47* 1.30* 1.19*  CALCIUM  9.1 9.5 10.0 10.0 10.5*  MG 2.2 2.2 2.3 2.0 2.1  PHOS  --   --  2.9 2.8  --    GFR: Estimated Creatinine Clearance: 52.5 mL/min (A) (by C-G formula based on SCr of 1.19 mg/dL (H)). Recent Labs  Lab 05/06/24 1745 05/06/24 1747 05/07/24 0559 05/07/24 1700 05/08/24 0415  WBC 12.7*  --  12.9* 17.3* 15.0*  LATICACIDVEN  --  1.1  --   --   --     Liver Function Tests: No results for input(s): AST, ALT, ALKPHOS, BILITOT, PROT, ALBUMIN  in the last 168 hours. No results for input(s): LIPASE, AMYLASE in the last 168 hours. No results for input(s): AMMONIA in the last 168 hours.  ABG    Component Value Date/Time   PHART 7.347 (L) 05/07/2024 0209   PCO2ART 47.3 05/07/2024 0209   PO2ART 93 05/07/2024 0209   HCO3 25.9 05/07/2024 0209   TCO2 27 05/07/2024 0209   ACIDBASEDEF 1.0 05/06/2024 2042   O2SAT 97 05/07/2024 0209     Coagulation Profile: Recent Labs  Lab 05/03/24 0350  05/03/24 0915 05/04/24 0847 05/05/24 0321 05/06/24 1055  INR 2.8* 2.7* 1.9* 1.5* 1.5*    Cardiac Enzymes: No results for input(s): CKTOTAL, CKMB, CKMBINDEX, TROPONINI in the last 168 hours.  HbA1C: Hemoglobin A1C  Date/Time Value Ref Range Status  09/25/2021 12:00 AM 10.3  Final   HbA1c, POC (controlled diabetic range)  Date/Time Value Ref Range Status  07/14/2023 10:20 AM 10.6 (A) 0.0 - 7.0 % Final  03/04/2023 01:09 PM 10.2 (A) 0.0 - 7.0 % Final   Hgb A1c MFr Bld  Date/Time Value Ref Range Status  05/05/2024 03:21 AM 9.2 (H) 4.8 - 5.6 % Final    Comment:    (NOTE)         Prediabetes: 5.7 - 6.4         Diabetes: >6.4         Glycemic control for adults with diabetes: <7.0   04/16/2023 02:53  PM 11.4 (H) 4.8 - 5.6 % Final    Comment:    (NOTE)         Prediabetes: 5.7 - 6.4         Diabetes: >6.4         Glycemic control for adults with diabetes: <7.0     CBG: Recent Labs  Lab 05/08/24 1549 05/08/24 1947 05/08/24 2358 05/09/24 0418 05/09/24 0725  GLUCAP 280* 324* 261* 188* 187*       Rexene LOISE Blush, [place] [facility] 05/09/24 8:47 AM  Please see [email or web address] for pager details.  From 7A-7P if no response, please call [phone] After hours, please call ELink [phone]

## 2024-05-09 NOTE — Progress Notes (Signed)
 Physical Therapy Treatment Patient Details Name: Rachel Odonnell MRN: [id number] DOB: 1956-04-17 Today's Date: 05/09/2024   History of Present Illness 68 year old woman Admitted with ACS and a non-STEMI.  Repeat cardiac catheterization 05/04/2024 showed severe one-vessel CAD due to in-stent restenosis in the LAD.  Echocardiogram with intact LV function and mild AS.  She went for one-vessel CABG on 05/06/2024; former smoker, history of CAD (PTCI), with type 2 diabetes, hyperlipidemia, hypertension, paroxysmal atrial fibrillation, ADD with anxiety and depression, OSA    PT Comments  Making progress towards functional goals. States she feels dizzy and nauseated this morning. RN provided IV zofran  which seemed to help a bit towards end of session. BP stable enough to safely mobilize today - tolerated 38 feet of ambulation with min assist for balance and RW control (posterior instability.) Performed LE exercises and transfer training, again with min assist for boost and balance. Needs reinforcement of sternal precautions and use of heart pillow for better recall. Patient will continue to benefit from skilled physical therapy services to further improve independence with functional mobility.   Seated BP 102/64 (MAP 74) HR 63; Standing BP 114/106 HR 66; Seated BP after activity end of session with LEs elevated 154/42 (MAP 74) HR 63.     If plan is discharge home, recommend the following: A little help with walking and/or transfers;A little help with bathing/dressing/bathroom;Assistance with cooking/housework;Help with stairs or ramp for entrance   Can travel by private vehicle        Equipment Recommendations  Rolling walker (2 wheels);BSC/3in1;Other (comment) (Can consider tub transfer bench; Pt seems to prefer to use her bathroom that has a tub)    Recommendations for Other Services       Precautions / Restrictions Precautions Precautions: Fall Recall of Precautions/Restrictions:  Intact Precaution/Restrictions Comments: sternal precautions Restrictions Weight Bearing Restrictions Per Provider Order: Yes RUE Weight Bearing Per Provider Order: Non weight bearing LUE Weight Bearing Per Provider Order: Non weight bearing Other Position/Activity Restrictions: Sternal Prec     Mobility  Bed Mobility               General bed mobility comments: in recliner    Transfers Overall transfer level: Needs assistance Equipment used: Rolling walker (2 wheels) Transfers: Sit to/from Stand Sit to Stand: Min assist           General transfer comment: Min assist for boost to stand x2 from recliner. Cues for hand placement to hug heart pillow in order to recall precautions and for comfort. Still having some difficulty recalling. Pt leans posteriorly when descending and upon standing until cued.    Ambulation/Gait Ambulation/Gait assistance: Min assist Gait Distance (Feet): 38 Feet Assistive device: Rolling walker (2 wheels) Gait Pattern/deviations: Step-through pattern, Decreased stride length, Shuffle, Drifts right/left, Leaning posteriorly Gait velocity: dec Gait velocity interpretation: <1.31 ft/sec, indicative of household ambulator Pre-gait activities: weight shifting, followed by static march, min assist for balalnce, RW for support General Gait Details: Cues for upright posture to maintain precautions which were well controlled during bout. Reviewed AD use with RW for light support. Cues for step symmetry and to increase length. Min assist required as she fatigued towards end of distance to navigate RW. No overt buckling. Some posterior instability present as well. SpO2 93% on 3L supplemental O2.   Stairs             Wheelchair Mobility     Tilt Bed    Modified Rankin (Stroke Patients Only)  Balance Overall balance assessment: Needs assistance Sitting-balance support: Feet supported, No upper extremity supported Sitting balance-Leahy  Scale: Fair Sitting balance - Comments: sat edge of chair without assist   Standing balance support: Reliant on assistive device for balance, Bilateral upper extremity supported Standing balance-Leahy Scale: Poor Standing balance comment: posterior lean                            Communication Communication Communication: Impaired Factors Affecting Communication: Hearing impaired  Cognition Arousal: Alert Behavior During Therapy: WFL for tasks assessed/performed   PT - Cognitive impairments: No apparent impairments                         Following commands: Intact      Cueing Cueing Techniques: Verbal cues, Gestural cues  Exercises General Exercises - Lower Extremity Ankle Circles/Pumps: AROM, Both, 10 reps, Seated Long Arc Quad: Strengthening, Both, 10 reps, Seated Hip ABduction/ADduction: Strengthening, Both, 10 reps, Seated Hip Flexion/Marching: Strengthening, Both, 10 reps, Seated    General Comments General comments (skin integrity, edema, etc.): Seated BP 102/64 (MAP 74) HR 63; Standing BP 114/106 HR 66; Seated BP after activity end of session with LEs elevated 154/42 (MAP 74) HR 63.      Pertinent Vitals/Pain Pain Assessment Pain Assessment: Faces Faces Pain Scale: Hurts little more Pain Location: Sternum Pain Descriptors / Indicators: Aching, Operative site guarding, Sore    Home Living                          Prior Function            PT Goals (current goals can now be found in the care plan section) Acute Rehab PT Goals Patient Stated Goal: get home; and get [place] to the beach when she can PT Goal Formulation: With patient Time For Goal Achievement: 05/21/24 Potential to Achieve Goals: Good Progress towards PT goals: Progressing toward goals    Frequency    Min 3X/week      PT Plan      Co-evaluation              AM-PAC PT 6 Clicks Mobility   Outcome Measure  Help needed turning from your back to  your side while in a flat bed without using bedrails?: A Little Help needed moving from lying on your back to sitting on the side of a flat bed without using bedrails?: A Lot Help needed moving to and from a bed to a chair (including a wheelchair)?: A Lot Help needed standing up from a chair using your arms (e.g., wheelchair or bedside chair)?: A Little Help needed to walk in hospital room?: A Little Help needed climbing 3-5 steps with a railing? : A Lot 6 Click Score: 15    End of Session Equipment Utilized During Treatment: Oxygen Activity Tolerance: Patient tolerated treatment well Patient left: in chair;with call bell/phone within reach;with chair alarm set;with nursing/sitter in room;with SCD's reapplied Nurse Communication: Mobility status PT Visit Diagnosis: Unsteadiness on feet (R26.81);Muscle weakness (generalized) (M62.81);Other abnormalities of gait and mobility (R26.89);Difficulty in walking, not elsewhere classified (R26.2)     Time: 0729-0801 PT Time Calculation (min) (ACUTE ONLY): 32 min  Charges:    $Gait Training: 8-22 mins $Therapeutic Activity: 8-22 mins PT General Charges $$ ACUTE PT VISIT: 1 Visit  Leontine Roads, PT, DPT [facility]  Rehabilitation Services Physical Therapist Office: [phone] Website: [email or web address]    Leontine GORMAN Roads 05/09/2024, 8:39 AM

## 2024-05-10 LAB — BASIC METABOLIC PANEL WITH GFR
Anion gap: 8 (ref 5–15)
BUN: 27 mg/dL — ABNORMAL HIGH (ref 8–23)
CO2: 28 mmol/L (ref 22–32)
Calcium: 10 mg/dL (ref 8.9–10.3)
Chloride: 99 mmol/L (ref 98–111)
Creatinine, Ser: 1.06 mg/dL — ABNORMAL HIGH (ref 0.44–1.00)
GFR, Estimated: 57 mL/min — ABNORMAL LOW (ref >60)
Glucose, Bld: 189 mg/dL — ABNORMAL HIGH (ref 70–99)
Potassium: 4.3 mmol/L (ref 3.5–5.1)
Sodium: 135 mmol/L (ref 135–145)

## 2024-05-10 LAB — PROTIME-INR
INR: 1.2 (ref 0.8–1.2)
Prothrombin Time: 15.5 s — ABNORMAL HIGH (ref 11.4–15.2)

## 2024-05-10 LAB — CBC
HCT: 31.3 % — ABNORMAL LOW (ref 36.0–46.0)
Hemoglobin: 10.1 g/dL — ABNORMAL LOW (ref 12.0–15.0)
MCH: 30.8 pg (ref 26.0–34.0)
MCHC: 32.3 g/dL (ref 30.0–36.0)
MCV: 95.4 fL (ref 80.0–100.0)
Platelets: 276 K/uL (ref 150–400)
RBC: 3.28 MIL/uL — ABNORMAL LOW (ref 3.87–5.11)
RDW: 13.7 % (ref 11.5–15.5)
WBC: 11.1 K/uL — ABNORMAL HIGH (ref 4.0–10.5)
nRBC: 0.4 % — ABNORMAL HIGH (ref 0.0–0.2)

## 2024-05-10 LAB — GLUCOSE, CAPILLARY
Glucose-Capillary: 147 mg/dL — ABNORMAL HIGH (ref 70–99)
Glucose-Capillary: 312 mg/dL — ABNORMAL HIGH (ref 70–99)
Glucose-Capillary: 202 mg/dL — ABNORMAL HIGH (ref 70–99)
Glucose-Capillary: 178 mg/dL — ABNORMAL HIGH (ref 70–99)

## 2024-05-10 MED ORDER — WARFARIN SODIUM 5 MG PO TABS
7.5000 mg | ORAL_TABLET | Freq: Once | ORAL | Status: AC
  Administered 2024-05-10: 7.5 mg via ORAL
  Filled 2024-05-10: qty 1

## 2024-05-10 MED ORDER — EMPAGLIFLOZIN 25 MG PO TABS
25.0000 mg | ORAL_TABLET | Freq: Every day | ORAL | Status: DC
  Administered 2024-05-10 – 2024-05-13 (×4): 25 mg via ORAL
  Filled 2024-05-10 (×5): qty 1

## 2024-05-10 MED ORDER — METFORMIN HCL 500 MG PO TABS
500.0000 mg | ORAL_TABLET | Freq: Every day | ORAL | Status: DC
  Administered 2024-05-10 – 2024-05-13 (×4): 500 mg via ORAL
  Filled 2024-05-10 (×4): qty 1

## 2024-05-10 MED FILL — Heparin Sodium (Porcine) Inj 1000 Unit/ML: Qty: 1000 | Status: AC

## 2024-05-10 MED FILL — Potassium Chloride Inj 2 mEq/ML: INTRAVENOUS | Qty: 40 | Status: AC

## 2024-05-10 MED FILL — Lidocaine HCl Local Preservative Free (PF) Inj 2%: INTRAMUSCULAR | Qty: 14 | Status: AC

## 2024-05-10 NOTE — Progress Notes (Signed)
 Mobility Specialist Progress Note;   05/10/24 1105  Mobility  Activity Ambulated with assistance  Level of Assistance Contact guard assist, steadying assist  Assistive Device Four wheel walker  Distance Ambulated (ft) 200 ft  RUE Weight Bearing Per Provider Order NWB  LUE Weight Bearing Per Provider Order NWB  Activity Response Tolerated well  Mobility Referral Yes  Mobility visit 1 Mobility  Mobility Specialist Start Time (ACUTE ONLY) 1105  Mobility Specialist Stop Time (ACUTE ONLY) 1120  Mobility Specialist Time Calculation (min) (ACUTE ONLY) 15 min   Pt agreeable to mobility. On 3LO2 upon arrival. Husband assisted pt in bed mobility, however required VC to stay in the tube and no pulling while standing. Ambulated on 3LO2, VSS when able to receive accurate pleth. Took 1x seated rest break d/t overall fatigue being the main complaint. Pt returned back to bed w/o fault and left with all needs met, call bell in reach. Husband present.   Lauraine Erm Mobility Specialist Please contact via SecureChat or [employer] [phone]

## 2024-05-10 NOTE — Progress Notes (Signed)
 4 Days Post-Op Procedure(s) (LRB): OFF PUMP CORONARY ARTERY BYPASS GRAFTING (CABG) TIMES ONE USING LEFT INTERNAL MAMMARY ARTERY (N/A) ECHOCARDIOGRAM, TRANSESOPHAGEAL, INTRAOPERATIVE (N/A) Subjective: Feels fair, some pain and weakness  Objective: Vital signs in last 24 hours: Temp:  [97.2 F (36.2 C)-98.8 F (37.1 C)] 98.8 F (37.1 C) (09/24 0351) Pulse Rate:  [57-96] 96 (09/24 0351) Cardiac Rhythm: Atrial fibrillation;Sinus bradycardia (09/23 2025) Resp:  [11-26] 18 (09/24 0351) BP: (95-150)/(43-97) 122/52 (09/24 0351) SpO2:  [90 %-100 %] 93 % (09/24 0351) Weight:  [100.2 kg] 100.2 kg (09/24 0500)  Hemodynamic parameters for last 24 hours:    Intake/Output from previous day: 09/23 0701 - 09/24 0700 In: 303 [P.O.:300; I.V.:3] Out: -  Intake/Output this shift: No intake/output data recorded.  General appearance: alert, cooperative, fatigued, and no distress Heart: regular rate and rhythm Lungs: mildly dim in bases Abdomen: benign Extremities: no edema Wound: incis healing well  Lab Results: Recent Labs    05/08/24 0415 05/09/24 0927  WBC 15.0* 13.0*  HGB 10.4* 10.0*  HCT 32.9* 30.9*  PLT 217 240   BMET:  Recent Labs    05/08/24 2359 05/09/24 0927  NA 132* 133*  K 4.5 4.2  CL 98 98  CO2 24 24  GLUCOSE 262* 224*  BUN 28* 28*  CREATININE 1.19* 1.08*  CALCIUM  10.5* 10.7*    PT/INR:  Recent Labs    05/09/24 1030  LABPROT 14.5  INR 1.1   ABG    Component Value Date/Time   PHART 7.347 (L) 05/07/2024 0209   HCO3 25.9 05/07/2024 0209   TCO2 27 05/07/2024 0209   ACIDBASEDEF 1.0 05/06/2024 2042   O2SAT 97 05/07/2024 0209   CBG (last 3)  Recent Labs    05/09/24 1711 05/09/24 2203 05/10/24 0606  GLUCAP 264* 258* 147*    Meds Scheduled Meds:  acetaminophen   1,000 mg Oral Q6H   Or   acetaminophen  (TYLENOL ) oral liquid 160 mg/5 mL  1,000 mg Per Tube Q6H   aspirin  EC  325 mg Oral Daily   Or   aspirin   324 mg Per Tube Daily   bisacodyl   10 mg  Oral Daily   Or   bisacodyl   10 mg Rectal Daily   Chlorhexidine  Gluconate Cloth  6 each Topical Daily   citalopram   20 mg Oral QPM   docusate sodium   200 mg Oral Daily   donepezil   5 mg Oral QPM   enoxaparin  (LOVENOX ) injection  40 mg Subcutaneous QHS   feeding supplement  237 mL Oral BID BM   fenofibrate   160 mg Oral Daily   fentaNYL  (SUBLIMAZE ) injection  25 mcg Intravenous Once   gabapentin   100 mg Oral QHS   Influenza vac split trivalent PF  0.5 mL Intramuscular Tomorrow-1000   insulin  aspart  0-24 Units Subcutaneous TID AC & HS   insulin  aspart  4 Units Subcutaneous TID WC   insulin  glargine  35 Units Subcutaneous BID   levothyroxine   50 mcg Oral Q0600   loratadine   10 mg Oral Daily   modafinil   100 mg Oral Daily   montelukast   10 mg Oral QHS   pantoprazole   40 mg Oral Daily   rosuvastatin   40 mg Oral Daily   sodium chloride  flush  3 mL Intravenous Q12H   sodium chloride  flush  3 mL Intravenous Q12H   Warfarin - Pharmacist Dosing Inpatient   Does not apply q1600   Continuous Infusions:  sodium chloride      albumin  human  Stopped (05/07/24 0001)   PRN Meds:.sodium chloride , albumin  human, albuterol , dextrose , midazolam , morphine  injection, ondansetron  (ZOFRAN ) IV, mouth rinse, oxyCODONE , sodium chloride  flush, sodium chloride  flush, traMADol   Xrays No results found.  Assessment/Plan: S/P Procedure(s) (LRB): OFF PUMP CORONARY ARTERY BYPASS GRAFTING (CABG) TIMES ONE USING LEFT INTERNAL MAMMARY ARTERY (N/A) ECHOCARDIOGRAM, TRANSESOPHAGEAL, INTRAOPERATIVE (N/A)  POD#4  1 adeb, s BP 95-150, afib, SB- d/w MD, no amio w. bradycardia 2 O2 sats good on 3 liters 3 voiding , not measured, weight about 2 kg >preop 4 BS poorly controlled- will resume jardiance  an glucophage  5 no new labs or xrays 6 INR 1.1- pharmacy dosing  7 push pulm hygiene and rehab as able  LOS: 8 days    Rachel Odonnell Cera PA-C Pager [phone] 05/10/2024

## 2024-05-10 NOTE — Progress Notes (Signed)
 Informed by mobility team (MT) upon arriving to unit that Physical Therapy was about to work with patient and that pt would benefit from sternal precaution education.  Pt mobilized with MT about 28ft today.

## 2024-05-10 NOTE — Progress Notes (Signed)
 Patient in At. Fib rate 80's-110's . Patient has hx of At.Fib . And in and out of At. Fib on this adm. No complaints voiced. Cont. To monitor patient V.S. S.

## 2024-05-10 NOTE — Progress Notes (Signed)
 CARDIAC REHAB PHASE I   PRE:  Rate/Rhythm: 60 SR    BP: sitting 130/68    SpO2: 97% 2.5  MODE:  Ambulation: 150 ft   POST:  Rate/Rhythm: 66 SR    BP: sitting 141/67     SpO2: 96% 3L 02   Pt desaturated to 86% on RA, tolerated walk poorly, stopping to twice to rest and c/o weakness and muscle aches in chest.  Pt appeared to drift to the right while walking and needed encouragement to walk and continue walking.  Discussed with pt sternal precautions when getting out of bed and assisted pt with same as well as getting pt to [facility].  Educated pt on I/S use and pt returned demonstration of the same, max level reached 600.  Returned pt to bedside chair.  Pt still needs post-op education re CABG and NSTEMI and CR referral.     [id number] RODGER Music, RN BSN 05/10/2024 3:20 PM

## 2024-05-10 NOTE — Progress Notes (Signed)
 PHARMACY - ANTICOAGULATION CONSULT NOTE  Pharmacy Consult for warfarin Indication: atrial fibrillation  Allergies  Allergen Reactions   Azithromycin Hives   Codeine Nausea Only   Atomoxetine Nausea And Vomiting   Atorvastatin Other (See Comments)    Restless legs   Lisinopril Nausea And Vomiting    Patient Measurements: Height: 5' 4 (162.6 cm) Weight: 100.2 kg (220 lb 14.4 oz) IBW/kg (Calculated) : 54.7 HEPARIN  DW (KG): 77.9  Vital Signs: Temp: 98.8 F (37.1 C) (09/24 0351) Temp Source: Oral (09/24 0351) BP: 122/52 (09/24 0351) Pulse Rate: 96 (09/24 0351)  Labs: Recent Labs    05/08/24 0415 05/08/24 0419 05/08/24 2359 05/09/24 0927 05/09/24 1030 05/10/24 0805  HGB 10.4*  --   --  10.0*  --  10.1*  HCT 32.9*  --   --  30.9*  --  31.3*  PLT 217  --   --  240  --  276  LABPROT  --   --   --   --  14.5 15.5*  INR  --   --   --   --  1.1 1.2  CREATININE 1.47*   < > 1.19* 1.08*  --  1.06*   < > = values in this interval not displayed.    Estimated Creatinine Clearance: 58.5 mL/min (A) (by C-G formula based on SCr of 1.06 mg/dL (H)).   Medical History: Past Medical History:  Diagnosis Date   ADD (attention deficit disorder)    Diabetes mellitus without complication (HCC)    Hypertension     Medications:  67 YOF on warfarin PTA for hx of atrial fibrillation presented with chest pain, NSTEMI. Warfarin held (last dose 9/15) with plans for cardiac cath. Patient was briefly on heparin  prior to cath earlier this admission. Patient now s/p CABG on 05/06/2024. Chest tubes and pacing wires have been removed. Pharmacy has been consulted for warfarin dosing. Discussed with TCTS, will restart heparin  without a bridge today.   PTA Warfarin: 2.5 mg on Monday and Thursdays, 5 mg EOD  9/24: INR 1.2, Hgb 10.1, PLT 276.   Goal of Therapy:  INR 2-3 Monitor platelets by anticoagulation protocol: Yes   Plan:  Warfarin 7.5 mg x1 today Monitor CBC, INR, and s/sx of bleeding  daily  Donny Alert, PharmD, [facility] Clinical Pharmacist Please see AMION for all Pharmacists' Contact Phone Numbers 05/10/2024, 9:12 AM

## 2024-05-11 DIAGNOSIS — I249 Acute ischemic heart disease, unspecified: Secondary | ICD-10-CM | POA: Diagnosis not present

## 2024-05-11 LAB — PROTIME-INR
INR: 1.3 — ABNORMAL HIGH (ref 0.8–1.2)
Prothrombin Time: 16.9 s — ABNORMAL HIGH (ref 11.4–15.2)

## 2024-05-11 LAB — GLUCOSE, CAPILLARY
Glucose-Capillary: 98 mg/dL (ref 70–99)
Glucose-Capillary: 157 mg/dL — ABNORMAL HIGH (ref 70–99)
Glucose-Capillary: 158 mg/dL — ABNORMAL HIGH (ref 70–99)
Glucose-Capillary: 215 mg/dL — ABNORMAL HIGH (ref 70–99)
Glucose-Capillary: 244 mg/dL — ABNORMAL HIGH (ref 70–99)

## 2024-05-11 MED ORDER — WARFARIN SODIUM 5 MG PO TABS
7.5000 mg | ORAL_TABLET | Freq: Once | ORAL | Status: AC
  Administered 2024-05-11: 7.5 mg via ORAL
  Filled 2024-05-11: qty 1

## 2024-05-11 MED ORDER — POTASSIUM CHLORIDE CRYS ER 20 MEQ PO TBCR
20.0000 meq | EXTENDED_RELEASE_TABLET | Freq: Every day | ORAL | Status: DC
  Administered 2024-05-11 – 2024-05-13 (×3): 20 meq via ORAL
  Filled 2024-05-11 (×3): qty 1

## 2024-05-11 MED ORDER — AMISULPRIDE (ANTIEMETIC) 5 MG/2ML IV SOLN
INTRAVENOUS | Status: AC
  Filled 2024-05-11: qty 4

## 2024-05-11 MED ORDER — FUROSEMIDE 40 MG PO TABS
40.0000 mg | ORAL_TABLET | Freq: Every day | ORAL | Status: DC
  Administered 2024-05-11: 40 mg via ORAL
  Filled 2024-05-11: qty 1

## 2024-05-11 NOTE — Progress Notes (Signed)
 Still very sore after CABG. Sinus bradycardia, no atrial fibrillation in last 24 hours. INR starting to increase after restarting warfarin, today 1.3 If she is ready to be discharged over the weekend, she already has a [place] [facility]  cl[facility] appointment on 05/17/2024 and a cardiology follow-up on 06/01/2024 with [name], NP.

## 2024-05-11 NOTE — Progress Notes (Signed)
 PT Cancellation Note  Patient Details Name: Rachel Odonnell MRN: [id number] DOB: Apr 11, 1956   Cancelled Treatment:    Reason Eval/Treat Not Completed: (P) Fatigue/lethargy limiting ability to participate (pt lethargic/falling asleep during attempt. Spouse present and requesting PTA reattempt tomorrow before noon. PTA reinforced with pt/spouse notify staff for OOB mobility needs, as pt will need ambulatory saturation test prior to DC.) Pt SpO2 100% on 2.5L O2 [place] with HOB at 43 deg, so PTA decreased wall O2 to 1.5L/min, RN notified/aware, SpO2 97% on 1.5L when PTA left pt room. Will continue efforts next date per PT plan of care as schedule permits.   Connell CHRISTELLA Aedon Deason 05/11/2024, 5:11 PM <8 mins spent in room on pt/family education on Move in the Tube precs and benefits of mobility/positioning and VS monitoring.

## 2024-05-11 NOTE — TOC Progression Note (Signed)
 Transition of Care (TOC) - Progression Note  Rayfield Gobble RN, BSN Inpatient Care Management Unit 4E- RN Case Manager See Treatment Team for direct phone #   Patient Details  Name: Rachel Odonnell MRN: [id number] Date of Birth: September 19, 1955  Transition of Care [facility]) CM/SW Contact  Gobble, Rayfield Hurst, RN Phone Number: 05/11/2024, 1:56 PM  Clinical Narrative:    Orders placed for [facility] and DME needs, CM spoke with pt and spouse who was at the bedside regarding transition needs.   Discussed DME- RW and BSC, per discussion pt has declined BSC as she is mobilizing the distance that she will have at home to get to bathroom- does not feel she will need it. Pt/spouse confirm she needs RW for home. States she has no preference in DME provider- would like RW delivered to room to take home.   Also discussed TCTS office referral to [facility] for [facility] needs. Pt and spouse voiced that they are agreeable to use [facility] and have no further preference in [facility] provider. [facility] liaison following for HHPT/OT needs.   Spouse to transport home once cleared medically.   Call made to [facility] liaison for DME request- RW to be delivered to pt prior to discharge.   Inpt CM will continue to follow.    Expected Discharge Plan: Home/Self Care Barriers to Discharge: Continued Medical Work up               Expected Discharge Plan and Services In-house Referral: NA Discharge Planning Services: CM Consult Post Acute Care Choice: Durable Medical Equipment, Home Health Living arrangements for the past 2 months: Single Family Home                 DME Arranged: Walker rolling DME Agency: [facility] Date DME Agency Contacted: 05/11/24 Time DME Agency Contacted: 1355 Representative spoke with at DME Agency: Darlyn HH Arranged: PT, OT HH Agency: [facility] ([facility]) Date HH Agency Contacted: 05/11/24 Time HH Agency Contacted: 1355 Representative spoke with at [facility] Agency: [facility]   Social Drivers  of Health (SDOH) Interventions SDOH Screenings   Food Insecurity: No Food Insecurity (05/02/2024)  Housing: Low Risk  (05/02/2024)  Transportation Needs: No Transportation Needs (05/02/2024)  Utilities: Not At Risk (05/02/2024)  Alcohol  Screen: Low Risk  (10/10/2021)  Depression (PHQ2-9): Medium Risk (05/19/2023)  Financial Resource Strain: Low Risk  (10/10/2021)  Physical Activity: Inactive (04/08/2023)   [facility] from [facility]  Social Connections: Socially Integrated (05/02/2024)  Stress: No Stress Concern Present (10/10/2021)  Tobacco Use: Medium Risk (05/05/2024)  Health Literacy: Low Risk  (12/31/2021)   Received from [facility]    Readmission Risk Interventions    05/11/2024    1:56 PM  Readmission Risk Prevention Plan  Transportation Screening Complete  HRI or Home Care Consult Complete  Social Work Consult for Recovery Care Planning/Counseling Complete  Palliative Care Screening Not Applicable  Medication Review Oceanographer) Complete

## 2024-05-11 NOTE — Progress Notes (Signed)
 CARDIAC REHAB PHASE I    Post OHS education including site care, restrictions, heart healthy diet, sternal precautions, IS use at home, exercise guidelines, and CRP2 reviewed. All questions and concerns addressed. Will refer to [facility] for CRP2.    [id number] Fairy JONETTA Music, RN BSN 05/11/2024 10:34 AM

## 2024-05-11 NOTE — Progress Notes (Signed)
 Patient is requesting to work with therapy to walk. PT notified.

## 2024-05-11 NOTE — Progress Notes (Signed)
 PHARMACY - ANTICOAGULATION CONSULT NOTE  Pharmacy Consult for warfarin Indication: atrial fibrillation  Allergies  Allergen Reactions   Azithromycin Hives   Codeine Nausea Only   Atomoxetine Nausea And Vomiting   Atorvastatin Other (See Comments)    Restless legs   Lisinopril Nausea And Vomiting    Patient Measurements: Height: 5' 4 (162.6 cm) Weight: 100.9 kg (222 lb 6.4 oz) IBW/kg (Calculated) : 54.7 HEPARIN  DW (KG): 77.9  Vital Signs: Temp: 98.7 F (37.1 C) (09/25 0909) Temp Source: Oral (09/25 0909) BP: 124/62 (09/25 0909) Pulse Rate: 60 (09/25 0909)  Labs: Recent Labs    05/08/24 2359 05/09/24 0927 05/09/24 1030 05/10/24 0805 05/11/24 0258  HGB  --  10.0*  --  10.1*  --   HCT  --  30.9*  --  31.3*  --   PLT  --  240  --  276  --   LABPROT  --   --  14.5 15.5* 16.9*  INR  --   --  1.1 1.2 1.3*  CREATININE 1.19* 1.08*  --  1.06*  --     Estimated Creatinine Clearance: 58.7 mL/min (A) (by C-G formula based on SCr of 1.06 mg/dL (H)).   Medical History: Past Medical History:  Diagnosis Date   ADD (attention deficit disorder)    Diabetes mellitus without complication (HCC)    Hypertension     Medications:  67 YOF on warfarin PTA for hx of atrial fibrillation presented with chest pain, NSTEMI. Warfarin held (last dose 9/15) with plans for cardiac cath. Patient was briefly on heparin  prior to cath earlier this admission. Patient now s/p CABG on 05/06/2024. Chest tubes and pacing wires have been removed. Pharmacy has been consulted for warfarin dosing. Discussed with TCTS, will restart heparin  without a bridge today.   PTA Warfarin: 2.5 mg on Monday and Thursdays, 5 mg EOD  9/25: INR 1.2 > 1.3, Hgb 10.1, PLT 276.   Goal of Therapy:  INR 2-3 Monitor platelets by anticoagulation protocol: Yes   Plan:  Warfarin 7.5 mg x1 again today Monitor CBC, INR, and s/sx of bleeding daily  Xavyer Steenson A. Lyle, PharmD, BCPS, [facility] Clinical Pharmacist [facility] Please utilize Amion for appropriate phone number to reach the unit pharmacist [facility])  05/11/2024, 9:39 AM

## 2024-05-11 NOTE — Progress Notes (Signed)
 [address] [place] [employer] [address]             [phone]     5 Days Post-Op Procedure(s) (LRB): OFF PUMP CORONARY ARTERY BYPASS GRAFTING (CABG) TIMES ONE USING LEFT INTERNAL MAMMARY ARTERY (N/A) ECHOCARDIOGRAM, TRANSESOPHAGEAL, INTRAOPERATIVE (N/A) Subjective: Feels ok, some pain  Objective: Vital signs in last 24 hours: Temp:  [97.6 F (36.4 C)-98.7 F (37.1 C)] 97.6 F (36.4 C) (09/25 0400) Pulse Rate:  [51-58] 58 (09/25 0613) Cardiac Rhythm: Sinus bradycardia (09/24 2000) Resp:  [15-21] 21 (09/25 0613) BP: (115-139)/(44-48) 139/44 (09/25 0400) SpO2:  [92 %-99 %] 99 % (09/25 0613) Weight:  [100.9 kg] 100.9 kg (09/25 0613)  Hemodynamic parameters for last 24 hours:    Intake/Output from previous day: 09/24 0701 - 09/25 0700 In: 123 [P.O.:120; I.V.:3] Out: -  Intake/Output this shift: No intake/output data recorded.  General appearance: alert, cooperative, and no distress Heart: regular rate and rhythm and brady Lungs: mildly dim in bases Abdomen: benign Extremities: no edema Wound: incis healing well  Lab Results: Recent Labs    05/09/24 0927 05/10/24 0805  WBC 13.0* 11.1*  HGB 10.0* 10.1*  HCT 30.9* 31.3*  PLT 240 276   BMET:  Recent Labs    05/09/24 0927 05/10/24 0805  NA 133* 135  K 4.2 4.3  CL 98 99  CO2 24 28  GLUCOSE 224* 189*  BUN 28* 27*  CREATININE 1.08* 1.06*  CALCIUM  10.7* 10.0    PT/INR:  Recent Labs    05/11/24 0258  LABPROT 16.9*  INR 1.3*   ABG    Component Value Date/Time   PHART 7.347 (L) 05/07/2024 0209   HCO3 25.9 05/07/2024 0209   TCO2 27 05/07/2024 0209   ACIDBASEDEF 1.0 05/06/2024 2042   O2SAT 97 05/07/2024 0209   CBG (last 3)  Recent Labs    05/10/24 1602 05/10/24 2111 05/11/24 0615  GLUCAP 202* 178* 98    Meds Scheduled Meds:  acetaminophen   1,000 mg Oral Q6H   Or   acetaminophen  (TYLENOL ) oral liquid 160 mg/5 mL  1,000 mg Per Tube Q6H   aspirin  EC  325 mg Oral Daily    Or   aspirin   324 mg Per Tube Daily   bisacodyl   10 mg Oral Daily   Or   bisacodyl   10 mg Rectal Daily   citalopram   20 mg Oral QPM   docusate sodium   200 mg Oral Daily   donepezil   5 mg Oral QPM   empagliflozin   25 mg Oral Daily   enoxaparin  (LOVENOX ) injection  40 mg Subcutaneous QHS   feeding supplement  237 mL Oral BID BM   fenofibrate   160 mg Oral Daily   fentaNYL  (SUBLIMAZE ) injection  25 mcg Intravenous Once   gabapentin   100 mg Oral QHS   Influenza vac split trivalent PF  0.5 mL Intramuscular Tomorrow-1000   insulin  aspart  0-24 Units Subcutaneous TID AC & HS   insulin  aspart  4 Units Subcutaneous TID WC   insulin  glargine  35 Units Subcutaneous BID   levothyroxine   50 mcg Oral Q0600   loratadine   10 mg Oral Daily   metFORMIN   500 mg Oral Q breakfast   modafinil   100 mg Oral Daily   montelukast   10 mg Oral QHS   pantoprazole   40 mg Oral Daily   rosuvastatin   40 mg Oral Daily   sodium chloride  flush  3  mL Intravenous Q12H   sodium chloride  flush  3 mL Intravenous Q12H   Warfarin - Pharmacist Dosing Inpatient   Does not apply q1600   Continuous Infusions:  albumin  human Stopped (05/07/24 0001)   PRN Meds:.albumin  human, albuterol , dextrose , midazolam , morphine  injection, ondansetron  (ZOFRAN ) IV, mouth rinse, oxyCODONE , sodium chloride  flush, sodium chloride  flush, traMADol   Xrays No results found.  Assessment/Plan: S/P Procedure(s) (LRB): OFF PUMP CORONARY ARTERY BYPASS GRAFTING (CABG) TIMES ONE USING LEFT INTERNAL MAMMARY ARTERY (N/A) ECHOCARDIOGRAM, TRANSESOPHAGEAL, INTRAOPERATIVE (N/A) POD#5  1 afeb, VSS, S [place] 2 O2 sats good on 2.5 l, sats drop w/ activity 3 weight about 2 KG >preop, will diurese a little 4 BS variable, 98-312 range yesterday, jardiance  and glucophage  restarted yesterday Cont same RX for now w /glargine and SSI- will need close management as outpatient but has been poorly controlled for a long time 5 INR 1.3- on for afib, pharm dosing 6  cont pulm hygiene and rehab, likely home in 24-48 hours as able to get O2 off and get coumadin  into therapeutic range   LOS: 9 days    Lemond FORBES Cera PA-C Pager [phone] 05/11/2024

## 2024-05-11 NOTE — Progress Notes (Signed)
  Progress Note  Patient Name: Rachel Odonnell Date of Encounter: 05/11/2024 [facility] Cardiologist: Jayson Sierras, MD   Interval Summary    INR 1.3. patient has chest soreness. No further Afib.  Vital Signs Vitals:   05/11/24 0000 05/11/24 0400 05/11/24 0613 05/11/24 0909  BP:  (!) 139/44  124/62  Pulse: (!) 51 (!) 54 (!) 58 60  Resp: 15 20 (!) 21 18  Temp:  97.6 F (36.4 C)  98.7 F (37.1 C)  TempSrc:  Oral  Oral  SpO2: 94% 98% 99% 90%  Weight:   100.9 kg   Height:        Intake/Output Summary (Last 24 hours) at 05/11/2024 1003 Last data filed at 05/10/2024 2300 Gross per 24 hour  Intake 120 ml  Output --  Net 120 ml      05/11/2024    6:13 AM 05/10/2024    5:00 AM 05/09/2024    5:00 AM  Last 3 Weights  Weight (lbs) 222 lb 6.4 oz 220 lb 14.4 oz 223 lb 15.8 oz  Weight (kg) 100.88 kg 100.2 kg 101.6 kg      Telemetry/ECG  SB HR 50s - Personally Reviewed  Physical Exam  GEN: No acute distress.   Neck: No JVD Cardiac: bradycardic, RR, no murmurs, rubs, or gallops.  Respiratory: Clear to auscultation bilaterally. GI: Soft, nontender, non-distended  MS: No edema  Assessment & Plan   CAD s/p CABG x 1 - POD 5 - she has chest soreness after CABG - Echo showed normal LVEF - follow-up scheduled  Post-op Afib - no further Afib - coumadin  per pharmacy    For questions or updates, please contact [facility] Please consult [email or web address] for contact info under         Signed, Zamere Pasternak VEAR Fishman, PA-C

## 2024-05-12 ENCOUNTER — Inpatient Hospital Stay (HOSPITAL_COMMUNITY)

## 2024-05-12 DIAGNOSIS — I249 Acute ischemic heart disease, unspecified: Secondary | ICD-10-CM | POA: Diagnosis not present

## 2024-05-12 LAB — GLUCOSE, CAPILLARY
Glucose-Capillary: 104 mg/dL — ABNORMAL HIGH (ref 70–99)
Glucose-Capillary: 168 mg/dL — ABNORMAL HIGH (ref 70–99)
Glucose-Capillary: 131 mg/dL — ABNORMAL HIGH (ref 70–99)
Glucose-Capillary: 127 mg/dL — ABNORMAL HIGH (ref 70–99)

## 2024-05-12 LAB — BASIC METABOLIC PANEL WITH GFR
Anion gap: 11 (ref 5–15)
BUN: 24 mg/dL — ABNORMAL HIGH (ref 8–23)
CO2: 31 mmol/L (ref 22–32)
Calcium: 9.8 mg/dL (ref 8.9–10.3)
Chloride: 99 mmol/L (ref 98–111)
Creatinine, Ser: 1.1 mg/dL — ABNORMAL HIGH (ref 0.44–1.00)
GFR, Estimated: 55 mL/min — ABNORMAL LOW (ref >60)
Glucose, Bld: 151 mg/dL — ABNORMAL HIGH (ref 70–99)
Potassium: 4 mmol/L (ref 3.5–5.1)
Sodium: 141 mmol/L (ref 135–145)

## 2024-05-12 LAB — CBC
HCT: 34.6 % — ABNORMAL LOW (ref 36.0–46.0)
Hemoglobin: 11.1 g/dL — ABNORMAL LOW (ref 12.0–15.0)
MCH: 30.4 pg (ref 26.0–34.0)
MCHC: 32.1 g/dL (ref 30.0–36.0)
MCV: 94.8 fL (ref 80.0–100.0)
Platelets: 415 K/uL — ABNORMAL HIGH (ref 150–400)
RBC: 3.65 MIL/uL — ABNORMAL LOW (ref 3.87–5.11)
RDW: 14.1 % (ref 11.5–15.5)
WBC: 11.2 K/uL — ABNORMAL HIGH (ref 4.0–10.5)
nRBC: 0.3 % — ABNORMAL HIGH (ref 0.0–0.2)

## 2024-05-12 LAB — PROTIME-INR
INR: 1.7 — ABNORMAL HIGH (ref 0.8–1.2)
Prothrombin Time: 20.7 s — ABNORMAL HIGH (ref 11.4–15.2)

## 2024-05-12 MED ORDER — WARFARIN SODIUM 5 MG PO TABS
5.0000 mg | ORAL_TABLET | Freq: Once | ORAL | Status: AC
  Administered 2024-05-12: 5 mg via ORAL
  Filled 2024-05-12: qty 1

## 2024-05-12 MED ORDER — ROPINIROLE HCL 1 MG PO TABS
2.0000 mg | ORAL_TABLET | Freq: Every day | ORAL | Status: DC
  Administered 2024-05-12: 2 mg via ORAL
  Filled 2024-05-12: qty 2

## 2024-05-12 MED ORDER — ACETAMINOPHEN 325 MG PO TABS
650.0000 mg | ORAL_TABLET | Freq: Four times a day (QID) | ORAL | Status: DC | PRN
  Administered 2024-05-12: 650 mg via ORAL

## 2024-05-12 MED ORDER — FUROSEMIDE 10 MG/ML IJ SOLN
40.0000 mg | Freq: Once | INTRAMUSCULAR | Status: AC
  Administered 2024-05-12: 40 mg via INTRAVENOUS
  Filled 2024-05-12: qty 4

## 2024-05-12 MED ORDER — BUSPIRONE HCL 5 MG PO TABS
5.0000 mg | ORAL_TABLET | Freq: Every day | ORAL | Status: DC
Start: 2024-05-12 — End: 2024-05-13
  Administered 2024-05-12: 5 mg via ORAL
  Filled 2024-05-12: qty 1

## 2024-05-12 NOTE — Progress Notes (Addendum)
 [address] [employer] [address]             [phone]     6 Days Post-Op Procedure(s) (LRB): OFF PUMP CORONARY ARTERY BYPASS GRAFTING (CABG) TIMES ONE USING LEFT INTERNAL MAMMARY ARTERY (N/A) ECHOCARDIOGRAM, TRANSESOPHAGEAL, INTRAOPERATIVE (N/A) Subjective: Conts to look and feel better/stronger  Objective: Vital signs in last 24 hours: Temp:  [97.8 F (36.6 C)-98.7 F (37.1 C)] 97.9 F (36.6 C) (09/26 0408) Pulse Rate:  [52-60] 52 (09/26 0408) Cardiac Rhythm: Sinus bradycardia (09/25 1900) Resp:  [16-20] 18 (09/26 0408) BP: (117-148)/(50-62) 146/54 (09/26 0408) SpO2:  [90 %-99 %] 99 % (09/26 0408) Weight:  [100.3 kg] 100.3 kg (09/26 0408)  Hemodynamic parameters for last 24 hours:    Intake/Output from previous day: 09/25 0701 - 09/26 0700 In: 720 [P.O.:720] Out: -  Intake/Output this shift: No intake/output data recorded.  General appearance: alert and cooperative Heart: regular rate and rhythm soft sytolic murmur Lungs, mildly dim on bases Abd: benign Ext: no edema Incis healing well  Lab Results: Recent Labs    05/09/24 0927 05/10/24 0805  WBC 13.0* 11.1*  HGB 10.0* 10.1*  HCT 30.9* 31.3*  PLT 240 276   BMET:  Recent Labs    05/10/24 0805 05/12/24 0342  NA 135 141  K 4.3 4.0  CL 99 99  CO2 28 31  GLUCOSE 189* 151*  BUN 27* 24*  CREATININE 1.06* 1.10*  CALCIUM  10.0 9.8    PT/INR:  Recent Labs    05/12/24 0342  LABPROT 20.7*  INR 1.7*   ABG    Component Value Date/Time   PHART 7.347 (L) 05/07/2024 0209   HCO3 25.9 05/07/2024 0209   TCO2 27 05/07/2024 0209   ACIDBASEDEF 1.0 05/06/2024 2042   O2SAT 97 05/07/2024 0209   CBG (last 3)  Recent Labs    05/11/24 1608 05/11/24 2132 05/12/24 0638  GLUCAP 215* 244* 104*    Meds Scheduled Meds:  aspirin  EC  325 mg Oral Daily   Or   aspirin   324 mg Per Tube Daily   bisacodyl   10 mg Oral Daily   Or   bisacodyl   10 mg Rectal Daily   busPIRone   5 mg  Oral QHS   citalopram   20 mg Oral QPM   docusate sodium   200 mg Oral Daily   donepezil   5 mg Oral QPM   empagliflozin   25 mg Oral Daily   enoxaparin  (LOVENOX ) injection  40 mg Subcutaneous QHS   feeding supplement  237 mL Oral BID BM   fenofibrate   160 mg Oral Daily   fentaNYL  (SUBLIMAZE ) injection  25 mcg Intravenous Once   furosemide   40 mg Oral Daily   gabapentin   100 mg Oral QHS   Influenza vac split trivalent PF  0.5 mL Intramuscular Tomorrow-1000   insulin  aspart  0-24 Units Subcutaneous TID AC & HS   insulin  aspart  4 Units Subcutaneous TID WC   insulin  glargine  35 Units Subcutaneous BID   levothyroxine   50 mcg Oral Q0600   loratadine   10 mg Oral Daily   metFORMIN   500 mg Oral Q breakfast   modafinil   100 mg Oral Daily   montelukast   10 mg Oral QHS   pantoprazole   40 mg Oral Daily   potassium chloride   20 mEq Oral Daily   rOPINIRole   2 mg Oral QHS   rosuvastatin   40 mg Oral Daily  sodium chloride  flush  3 mL Intravenous Q12H   sodium chloride  flush  3 mL Intravenous Q12H   Warfarin - Pharmacist Dosing Inpatient   Does not apply q1600   Continuous Infusions:  albumin  human Stopped (05/07/24 0001)   PRN Meds:.albumin  human, albuterol , dextrose , midazolam , morphine  injection, ondansetron  (ZOFRAN ) IV, mouth rinse, oxyCODONE , sodium chloride  flush, sodium chloride  flush, traMADol   Xrays No results found.  Assessment/Plan: S/P Procedure(s) (LRB): OFF PUMP CORONARY ARTERY BYPASS GRAFTING (CABG) TIMES ONE USING LEFT INTERNAL MAMMARY ARTERY (N/A) ECHOCARDIOGRAM, TRANSESOPHAGEAL, INTRAOPERATIVE (N/A) POD#6  1 afeb, s BP 110's-140's, sinus brady 2 O2 sats good on 1 liter 3 voiding, not all measured, weight about 3 kg > preop, cont diuresis, + BM 4 BS somewhat improving control- will need close f/u with her endocrinologist- d/w patient and she understands  5 creat 1.10- baseline 6 CXR - some basilar ASD 7 INR 1.7- cont coumadin  per pharm dosing for afib 8 good overall  progress, will attempt to wean off O2 this am and poss home later today vs tomorrow    LOS: 10 days    Lemond FORBES Cera PA-C Pager [phone] 05/12/2024  Agree Dispo planning  Wende Longstreth MALVA Rayas

## 2024-05-12 NOTE — Progress Notes (Signed)
 PHARMACY - ANTICOAGULATION CONSULT NOTE  Pharmacy Consult for warfarin Indication: atrial fibrillation  Allergies  Allergen Reactions   Azithromycin Hives   Codeine Nausea Only   Atomoxetine Nausea And Vomiting   Atorvastatin Other (See Comments)    Restless legs   Lisinopril Nausea And Vomiting    Patient Measurements: Height: 5' 4 (162.6 cm) Weight: 100.3 kg (221 lb 1.9 oz) IBW/kg (Calculated) : 54.7 HEPARIN  DW (KG): 77.9  Vital Signs: Temp: 97.9 F (36.6 C) (09/26 0408) Temp Source: Oral (09/26 0408) BP: 146/54 (09/26 0408) Pulse Rate: 52 (09/26 0408)  Labs: Recent Labs    05/09/24 0927 05/09/24 1030 05/10/24 0805 05/11/24 0258 05/12/24 0342  HGB 10.0*  --  10.1*  --   --   HCT 30.9*  --  31.3*  --   --   PLT 240  --  276  --   --   LABPROT  --    < > 15.5* 16.9* 20.7*  INR  --    < > 1.2 1.3* 1.7*  CREATININE 1.08*  --  1.06*  --  1.10*   < > = values in this interval not displayed.    Estimated Creatinine Clearance: 56.3 mL/min (A) (by C-G formula based on SCr of 1.1 mg/dL (H)).   Medical History: Past Medical History:  Diagnosis Date   ADD (attention deficit disorder)    Diabetes mellitus without complication (HCC)    Hypertension     Medications:  67 YOF on warfarin PTA for hx of atrial fibrillation presented with chest pain, NSTEMI. Warfarin held (last dose 9/15) with plans for cardiac cath. Patient was briefly on heparin  prior to cath earlier this admission. Patient now s/p CABG on 05/06/2024. Chest tubes and pacing wires have been removed. Pharmacy has been consulted for warfarin dosing. Discussed with TCTS, will restart heparin  without a bridge today.   PTA Warfarin: 2.5 mg on Monday and Thursdays, 5 mg EOD  9/26: INR 1.2 > 1.3>1.7, last Hgb 10.1, PLT 276. Significant movement in INR today. Will resume home dose of 5 mg today and follow up in AM  Goal of Therapy:  INR 2-3 Monitor platelets by anticoagulation protocol: Yes   Plan:   Warfarin 5 mg x 1 today Monitor CBC, INR, and s/sx of bleeding daily  Arieonna Medine A. Lyle, PharmD, BCPS, [facility] Clinical Pharmacist [place] Please utilize Amion for appropriate phone number to reach the unit pharmacist [facility])  05/12/2024, 7:33 AM

## 2024-05-12 NOTE — Progress Notes (Addendum)
  Progress Note  Patient Name: Rachel Odonnell Date of Encounter: 05/12/2024 [facility] Cardiologist: Jayson Sierras, MD   Interval Summary    Patient is still requiring supplemental O2 and unable to discharge today. She has chest soreness. BP soft.  Vital Signs Vitals:   05/11/24 1957 05/11/24 2244 05/12/24 0408 05/12/24 0927  BP: (!) 117/50 (!) 148/57 (!) 146/54 (!) 98/46  Pulse: (!) 55 (!) 55 (!) 52   Resp: 20 16 18    Temp: 97.9 F (36.6 C) 97.9 F (36.6 C) 97.9 F (36.6 C) 98 F (36.7 C)  TempSrc: Oral Oral Oral Oral  SpO2: 97% 98% 99%   Weight:   100.3 kg   Height:        Intake/Output Summary (Last 24 hours) at 05/12/2024 0936 Last data filed at 05/11/2024 1725 Gross per 24 hour  Intake 720 ml  Output --  Net 720 ml      05/12/2024    4:08 AM 05/11/2024    6:13 AM 05/10/2024    5:00 AM  Last 3 Weights  Weight (lbs) 221 lb 1.9 oz 222 lb 6.4 oz 220 lb 14.4 oz  Weight (kg) 100.3 kg 100.88 kg 100.2 kg      Telemetry/ECG  SB hr 50s - Personally Reviewed  Physical Exam  GEN: No acute distress.   Neck: No JVD Cardiac: RRR, no murmurs, rubs, or gallops.  Respiratory: Clear to auscultation bilaterally. GI: Soft, nontender, non-distended  MS: No edema  Assessment & Plan   CAD s/p CABG x 1 - POD 6 - she has chest soreness after CABG - Echo showed normal LVEF - follow-up scheduled   Post-op Afib - no further Afib - coumadin  per pharmacy      For questions or updates, please contact [facility] Please consult [email or web address] for contact info under         Signed, Cadence VEAR Fishman, PA-C   I have seen and examined the patient along with Cadence VEAR Fishman, PA-C .  I have reviewed the chart, notes and new data.  I agree with PA/NP's note.  Key new complaints: Postsurgical pain Key examination changes: Reduced breath sounds in both bases Key new findings / data: NR 1.7  PLAN: Suspect hypoxemia due to atelectasis.  Encourage incentive  spirometry INR will likely be therapeutic tomorrow.  [facility] will sign off.   Medication Recommendations: Per surgical team.  Although she presented with acute coronary syndrome, will not be placed on clopidogrel  due to full anticoagulation with warfarin. Other recommendations (labs, testing, etc): Coumadin  clinic in [place] 05/17/2024 Follow up as an outpatient: Rachel Crate, NP 06/01/2024   Rachel Balding, MD, [facility] [phone] 05/12/2024, 9:50 AM

## 2024-05-12 NOTE — Progress Notes (Addendum)
 Came earlier today and she was eating. Planned to return to check SpO2. Pt sts she just ambulated with staff and her SpO2 stayed 93 RA. Reviewed ed with pt and husband. Encouraged more walking and IS.  1500-1510 Aliene Aris BS, ACSM-CEP 05/12/2024 3:14 PM

## 2024-05-12 NOTE — Progress Notes (Signed)
 Occupational Therapy Treatment Patient Details Name: Rachel Odonnell MRN: [id number] DOB: 1955/10/13 Today's Date: 05/12/2024   History of present illness 68 year old woman Admitted with ACS and a non-STEMI.  Repeat cardiac catheterization 05/04/2024 showed severe one-vessel CAD due to in-stent restenosis in the LAD.  Echocardiogram with intact LV function and mild AS.  She went for one-vessel CABG on 05/06/2024; former smoker, history of CAD (PTCI), with type 2 diabetes, hyperlipidemia, hypertension, paroxysmal atrial fibrillation, ADD with anxiety and depression, OSA   OT comments  Pt. Seen for skilled OT treatment session with spouse present.  Pts. Spouse very helpful and active in her care. Reports he is available 24/7 and will be assisting with transfers, and ADLs prn.  Pt. Completed bed mobility with MOD A for trunk support while maintaining sternal precautions.  MAX A for LB dressing.  In room ambulation to/from b.room with CGA.  Pt. With good adherence to precautions during mobility and ADLs.    Extensive education on PLB and sitting after each positional change to ensure feeling ok to proceed with desired activity.  Pt. With good return demo and able to integrate during mobility and transfers to maintain safe levels of o2 on RA.  Pt. 90-94% during activity with rest breaks and emphasis  on PLB during rest breaks and positional changes.  Permission to leave pt. On RA at end of session.  Nasal canula placed near by per rn request if needed.  Pt. Seated eob ranging between 90-96% at end of session.  BPs listed below.    SUPINE: 140/47-56 SEATED: 106/94-65 SEATED AFTER ACTIVITY: 132/55-66        If plan is discharge home, recommend the following:  A little help with walking and/or transfers;A little help with bathing/dressing/bathroom   Equipment Recommendations  Other (comment)    Recommendations for Other Services      Precautions / Restrictions Precautions Precautions: Fall Recall of  Precautions/Restrictions: Intact Precaution/Restrictions Comments: sternal precautions Restrictions Other Position/Activity Restrictions: Sternal Prec       Mobility Bed Mobility Overal bed mobility: Needs Assistance Bed Mobility: Supine to Sit     Supine to sit: Mod assist, HOB elevated     General bed mobility comments: reports she has a bed with functions like a hospita bed but no rails. able to go supine to sit with mod a for trunk support while holding heart pillow. once seated able to scoot hips to achieve feet on the floor without physical assistance    Transfers Overall transfer level: Needs assistance Equipment used: Rolling walker (2 wheels) Transfers: Sit to/from Stand, Bed to chair/wheelchair/BSC Sit to Stand: Supervision     Step pivot transfers: Contact guard assist     General transfer comment: able to sit/stand from eob and toilet without physical assistance and also not using bues.     Balance                                           ADL either performed or assessed with clinical judgement   ADL Overall ADL's : Needs assistance/impaired                     Lower Body Dressing: Maximal assistance;Sitting/lateral leans Lower Body Dressing Details (indicate cue type and reason): pt. and spouse report he has been and will be assisting with LB ADLs prn Toilet Transfer: Contact guard assist;Rolling  walker (2 wheels);Comfort height toilet;Ambulation;Cueing for sequencing;Cueing for safety Toilet Transfer Details (indicate cue type and reason): good adherance to precautions during ambulation to/from b.room Toileting- Clothing Manipulation and Hygiene: Maximal assistance;Sit to/from stand Toileting - Clothing Manipulation Details (indicate cue type and reason): pt. requesting assistance stating i dont want to pull on my incision, i dont want to be twisting it to reach       General ADL Comments: husband present during session and  very active in pts. care.  states he has already been assisting to/from b.room for toileting needs, helping do her hair and LB dressing prn    Extremity/Trunk Assessment              Vision       Perception     Praxis     Communication Communication Communication: No apparent difficulties   Cognition Arousal: Alert Behavior During Therapy: WFL for tasks assessed/performed               OT - Cognition Comments: good recall of precautions during session                          Cueing   Cueing Techniques: Verbal cues, Gestural cues  Exercises      Shoulder Instructions       General Comments  PLB/energy conservation education with pt. And spouse verbalizing understanding. Good return demo and able to achieve normal ranges on RA.  Pt. Also used IS at end of session with good tech. Spouse reports he has her use it hourly     Pertinent Vitals/ Pain       Pain Assessment Pain Assessment: No/denies pain  Home Living                                          Prior Functioning/Environment              Frequency  Min 2X/week        Progress Toward Goals  OT Goals(current goals can now be found in the care plan section)  Progress towards OT goals: Progressing toward goals     Plan      Co-evaluation                 AM-PAC OT 6 Clicks Daily Activity     Outcome Measure   Help from another person eating meals?: A Little Help from another person taking care of personal grooming?: A Little Help from another person toileting, which includes using toliet, bedpan, or urinal?: A Lot Help from another person bathing (including washing, rinsing, drying)?: A Lot Help from another person to put on and taking off regular upper body clothing?: A Little Help from another person to put on and taking off regular lower body clothing?: A Lot 6 Click Score: 15    End of Session Equipment Utilized During Treatment:  Oxygen;Rolling walker (2 wheels)  OT Visit Diagnosis: Unsteadiness on feet (R26.81);Muscle weakness (generalized) (M62.81)   Activity Tolerance Patient tolerated treatment well   Patient Left Other (comment);with family/visitor present (seated eob with nurses permission, husband present)   Nurse Communication Other (comment) (updated rn on o2 status and BP readings, also permission for pt. to sit eob at end of session)        Time: 8988-8955 OT Time Calculation (min): 33 min  Charges: OT  General Charges $OT Visit: 1 Visit OT Treatments $Self Care/Home Management : 23-37 mins  Randall, COTA/L Acute Rehabilitation [phone]   CHRISTELLA Nest Lorraine-COTA/L  05/12/2024, 11:06 AM

## 2024-05-12 NOTE — Progress Notes (Signed)
 Mobility Specialist Progress Note;   05/12/24 1450  Mobility  Activity Ambulated with assistance  Level of Assistance Standby assist, set-up cues, supervision of patient - no hands on  Assistive Device Front wheel walker  Distance Ambulated (ft) 200 ft  Activity Response Tolerated well  Mobility Referral Yes  Mobility visit 1 Mobility  Mobility Specialist Start Time (ACUTE ONLY) 1450  Mobility Specialist Stop Time (ACUTE ONLY) 1505  Mobility Specialist Time Calculation (min) (ACUTE ONLY) 15 min   Staff requesting pt to ambulate, pt agreeable. Husband assisted pt in bed mobility w/ proper sternal precautions techniques. Ambulated on RA, SPO2 91-95% throughout. Only c/o legs feeling weak, otherwise asx. Pt returned back to bed and left with all needs met, call bell in reach. RN notified.   Lauraine Erm Mobility Specialist Please contact via SecureChat or [employer] [phone]

## 2024-05-13 LAB — GLUCOSE, CAPILLARY
Glucose-Capillary: 145 mg/dL — ABNORMAL HIGH (ref 70–99)
Glucose-Capillary: 199 mg/dL — ABNORMAL HIGH (ref 70–99)

## 2024-05-13 LAB — PROTIME-INR
INR: 1.8 — ABNORMAL HIGH (ref 0.8–1.2)
Prothrombin Time: 21.8 s — ABNORMAL HIGH (ref 11.4–15.2)

## 2024-05-13 MED ORDER — FUROSEMIDE 40 MG PO TABS: 40.0000 mg | ORAL_TABLET | Freq: Every day | ORAL | 11 refills | Status: DC

## 2024-05-13 MED ORDER — FUROSEMIDE 40 MG PO TABS
40.0000 mg | ORAL_TABLET | Freq: Every day | ORAL | 0 refills | Status: DC
Start: 2024-05-13 — End: 2024-06-23

## 2024-05-13 MED ORDER — ACETAMINOPHEN 325 MG PO TABS: 650.0000 mg | ORAL_TABLET | Freq: Four times a day (QID) | ORAL | Status: DC | PRN

## 2024-05-13 MED ORDER — POTASSIUM CHLORIDE CRYS ER 20 MEQ PO TBCR: 20.0000 meq | EXTENDED_RELEASE_TABLET | Freq: Two times a day (BID) | ORAL | 0 refills | Status: DC

## 2024-05-13 MED ORDER — FUROSEMIDE 10 MG/ML IJ SOLN
40.0000 mg | Freq: Once | INTRAMUSCULAR | Status: AC
  Administered 2024-05-13: 40 mg via INTRAVENOUS
  Filled 2024-05-13: qty 4

## 2024-05-13 MED ORDER — ASPIRIN 325 MG PO TBEC: 325.0000 mg | DELAYED_RELEASE_TABLET | Freq: Every day | ORAL | Status: DC

## 2024-05-13 MED ORDER — FENOFIBRATE 160 MG PO TABS: 160.0000 mg | ORAL_TABLET | Freq: Every day | ORAL | 1 refills | Status: AC

## 2024-05-13 MED ORDER — POTASSIUM CHLORIDE CRYS ER 20 MEQ PO TBCR: 20.0000 meq | EXTENDED_RELEASE_TABLET | Freq: Every day | ORAL | 0 refills | Status: DC

## 2024-05-13 MED ORDER — FUROSEMIDE 40 MG PO TABS: 40.0000 mg | ORAL_TABLET | Freq: Every day | ORAL | 0 refills | Status: DC

## 2024-05-13 MED ORDER — WARFARIN SODIUM 5 MG PO TABS: 5.0000 mg | ORAL_TABLET | Freq: Once | ORAL | Status: DC

## 2024-05-13 MED ORDER — OXYCODONE HCL 5 MG PO TABS: 5.0000 mg | ORAL_TABLET | ORAL | 0 refills | Status: DC | PRN

## 2024-05-13 NOTE — Progress Notes (Addendum)
      [address] [place] [employer] [address]             [phone]         7 Days Post-Op Procedure(s) (LRB): OFF PUMP CORONARY ARTERY BYPASS GRAFTING (CABG) TIMES ONE USING LEFT INTERNAL MAMMARY ARTERY (N/A) ECHOCARDIOGRAM, TRANSESOPHAGEAL, INTRAOPERATIVE (N/A)  Subjective:  Patient doing well.  Ambulating without difficulty.  Husband at bedside.  + Bm  Objective: Vital signs in last 24 hours: Temp:  [97.6 F (36.4 C)-98.6 F (37 C)] 98.3 F (36.8 C) (09/27 0732) Pulse Rate:  [55-61] 56 (09/27 0732) Cardiac Rhythm: Sinus bradycardia (09/26 2017) Resp:  [13-20] 17 (09/27 0732) BP: (115-138)/(46-66) 129/53 (09/27 0732) SpO2:  [91 %-97 %] 91 % (09/27 0732) Weight:  [98.7 kg] 98.7 kg (09/27 0423)  Intake/Output from previous day: 09/26 0701 - 09/27 0700 In: 240 [P.O.:240] Out: -   General appearance: alert, cooperative, and no distress Heart: regular rate and rhythm Lungs: clear to auscultation bilaterally Abdomen: soft, non-tender; bowel sounds normal; no masses,  no organomegaly Extremities: edema trace Wound: clean and dry  Lab Results: Recent Labs    05/12/24 1203  WBC 11.2*  HGB 11.1*  HCT 34.6*  PLT 415*   BMET:  Recent Labs    05/12/24 0342  NA 141  K 4.0  CL 99  CO2 31  GLUCOSE 151*  BUN 24*  CREATININE 1.10*  CALCIUM  9.8    PT/INR:  Recent Labs    05/13/24 0343  LABPROT 21.8*  INR 1.8*   ABG    Component Value Date/Time   PHART 7.347 (L) 05/07/2024 0209   HCO3 25.9 05/07/2024 0209   TCO2 27 05/07/2024 0209   ACIDBASEDEF 1.0 05/06/2024 2042   O2SAT 97 05/07/2024 0209   CBG (last 3)  Recent Labs    05/12/24 1638 05/12/24 2106 05/13/24 0621  GLUCAP 131* 127* 145*    Assessment/Plan: S/P Procedure(s) (LRB): OFF PUMP CORONARY ARTERY BYPASS GRAFTING (CABG) TIMES ONE USING LEFT INTERNAL MAMMARY ARTERY (N/A) ECHOCARDIOGRAM, TRANSESOPHAGEAL, INTRAOPERATIVE (N/A)  CV- Sinus Bradycardia, BP stable Pulm- no  issues, off oxygen, continue IS Renal-creatinine has been stable, weight down about 4 lbs after IV Lasix  today, will repeat today H/O A. Fib on chronic coumadin , continue home regimen INR is at 1.7 Dispo- patient stable, off oxygen, will d/c home today   LOS: 11 days    Rocky Shad, PA-C 05/13/2024 9:37 AM  Doing well this morning.  Saturating well on RA, eating breakfast this morning. Ambulating without issues.  Plan to discharge home today after additional dose of IV lasix .   Con Clunes, MD Cardiothoracic Surgery Pager: [phone]

## 2024-05-13 NOTE — Progress Notes (Signed)
 Occupational Therapy Treatment Patient Details Name: Rachel Odonnell MRN: [id number] DOB: Jul 23, 1956 Today's Date: 05/13/2024   History of present illness 68 year old woman Admitted with ACS and a non-STEMI.  Repeat cardiac catheterization 05/04/2024 showed severe one-vessel CAD due to in-stent restenosis in the LAD.  Echocardiogram with intact LV function and mild AS.  She went for one-vessel CABG on 05/06/2024; former smoker, history of CAD (PTCI), with type 2 diabetes, hyperlipidemia, hypertension, paroxysmal atrial fibrillation, ADD with anxiety and depression, OSA   OT comments  Patient seated on EOB upon entry on RA with SpO2 95% and BP 129/53.  Patient performed UB dressing with gown for back and non-skid socks with patient's husband assisting.  Patient stood from EOB on RA with SpO2 91% and BP 107/53.  Patient performed mobility on RA with SpO2 90-93% with cues for PLB.  Patient returned to room and BP was 137/51, SpO2 92% before sitting back on EOB.  Discharge recommendations continue to be appropriate with patient returning home and husband to assist.        If plan is discharge home, recommend the following:  A little help with walking and/or transfers;A little help with bathing/dressing/bathroom   Equipment Recommendations  Other (comment) (defer)    Recommendations for Other Services      Precautions / Restrictions Precautions Precautions: Fall Recall of Precautions/Restrictions: Intact Precaution/Restrictions Comments: sternal precautions Restrictions Weight Bearing Restrictions Per Provider Order: No Other Position/Activity Restrictions: Sternal Prec       Mobility Bed Mobility Overal bed mobility: Needs Assistance             General bed mobility comments: seated on EOB upon entry and at end of session    Transfers Overall transfer level: Needs assistance Equipment used: Rolling walker (2 wheels) Transfers: Sit to/from Stand Sit to Stand: Supervision            General transfer comment: supervision to stand from EOB and CGA for transfers     Balance Overall balance assessment: Needs assistance Sitting-balance support: Feet supported, No upper extremity supported Sitting balance-Leahy Scale: Fair Sitting balance - Comments: EOB   Standing balance support: Single extremity supported, Bilateral upper extremity supported, Reliant on assistive device for balance Standing balance-Leahy Scale: Poor Standing balance comment: reliant on external support when standing                           ADL either performed or assessed with clinical judgement   ADL Overall ADL's : Needs assistance/impaired                 Upper Body Dressing : Minimal assistance;Standing Upper Body Dressing Details (indicate cue type and reason): gown for back Lower Body Dressing: Maximal assistance;Sitting/lateral leans Lower Body Dressing Details (indicate cue type and reason): patient's spouse assisting patient with LB dressing Toilet Transfer: Contact guard assist;Rolling walker (2 wheels)           Functional mobility during ADLs: Contact guard assist;Rolling walker (2 wheels) General ADL Comments: patient's husband present during visit and states he will assist with self care at home    [facility] Assessment              Vision       Perception     Praxis     Communication Communication Communication: No apparent difficulties   Cognition Arousal: Alert Behavior During Therapy: [facility] for tasks assessed/performed  OT - Cognition Comments: able to recall sternal precautions                 Following commands: Intact        Cueing   Cueing Techniques: Verbal cues, Gestural cues  Exercises      Shoulder Instructions       General Comments Patient SpO2 90-93% with mobility on RA    Pertinent Vitals/ Pain       Pain Assessment Pain Assessment: No/denies pain Pain Intervention(s):  Monitored during session  Home Living                                          Prior Functioning/Environment              Frequency  Min 2X/week        Progress Toward Goals  OT Goals(current goals can now be found in the care plan section)  Progress towards OT goals: Progressing toward goals  Acute Rehab OT Goals Patient Stated Goal: to go home OT Goal Formulation: With patient/family Time For Goal Achievement: 05/22/24 Potential to Achieve Goals: Good ADL Goals Pt Will Perform Upper Body Bathing: with set-up;sitting Pt Will Perform Lower Body Bathing: with min assist;sit to/from stand;with adaptive equipment Pt Will Perform Lower Body Dressing: with min assist;sit to/from stand;with adaptive equipment Pt Will Transfer to Toilet: with min assist;stand pivot transfer;bedside commode Additional ADL Goal #1: pt will complete bed mobility supervision with hob 30 degrees or less as precursor for adls. Additional ADL Goal #2: pt will verbalize sternal precautions 100% accuract  Plan      Co-evaluation                 AM-PAC OT 6 Clicks Daily Activity     Outcome Measure   Help from another person eating meals?: A Little Help from another person taking care of personal grooming?: A Little Help from another person toileting, which includes using toliet, bedpan, or urinal?: A Lot Help from another person bathing (including washing, rinsing, drying)?: A Lot Help from another person to put on and taking off regular upper body clothing?: A Little Help from another person to put on and taking off regular lower body clothing?: A Lot 6 Click Score: 15    End of Session Equipment Utilized During Treatment: Gait belt;Rolling walker (2 wheels);Oxygen  OT Visit Diagnosis: Unsteadiness on feet (R26.81);Muscle weakness (generalized) (M62.81)   Activity Tolerance Patient tolerated treatment well   Patient Left in bed;with call bell/phone within  reach;with family/visitor present;Other (comment) (seated on EOB)   Nurse Communication Mobility status;Other (comment) (O2 sats on RA)        Time: 9254-9189 OT Time Calculation (min): 25 min  Charges: OT General Charges $OT Visit: 1 Visit OT Treatments $Self Care/Home Management : 8-22 mins $Therapeutic Activity: 8-22 mins  Dick Odonnell, OTA Acute Rehabilitation Services  Office [phone]   Rachel Odonnell 05/13/2024, 10:36 AM

## 2024-05-13 NOTE — Progress Notes (Signed)
 CARDIAC REHAB PHASE I    Postop [facility] education completed. Referral for CRP2 sent to [facility]. Plan for discharge home today.    Vaughn Asberry Hacking, RN BSN 05/13/2024 9:00 AM

## 2024-05-13 NOTE — Progress Notes (Signed)
 PHARMACY - ANTICOAGULATION CONSULT NOTE  Pharmacy Consult for warfarin Indication: atrial fibrillation  Allergies  Allergen Reactions   Azithromycin Hives   Codeine Nausea Only   Atomoxetine Nausea And Vomiting   Atorvastatin Other (See Comments)    Restless legs   Lisinopril Nausea And Vomiting    Patient Measurements: Height: 5' 4 (162.6 cm) Weight: 98.7 kg (217 lb 9.5 oz) IBW/kg (Calculated) : 54.7 HEPARIN  DW (KG): 77.9  Vital Signs: Temp: 98.3 F (36.8 C) (09/27 0732) Temp Source: Oral (09/27 0732) BP: 129/53 (09/27 0732) Pulse Rate: 56 (09/27 0732)  Labs: Recent Labs    05/11/24 0258 05/12/24 0342 05/12/24 1203 05/13/24 0343  HGB  --   --  11.1*  --   HCT  --   --  34.6*  --   PLT  --   --  415*  --   LABPROT 16.9* 20.7*  --  21.8*  INR 1.3* 1.7*  --  1.8*  CREATININE  --  1.10*  --   --     Estimated Creatinine Clearance: 55.9 mL/min (A) (by C-G formula based on SCr of 1.1 mg/dL (H)).   Medical History: Past Medical History:  Diagnosis Date   ADD (attention deficit disorder)    Diabetes mellitus without complication (HCC)    Hypertension     Medications:  67 YOF on warfarin PTA for hx of atrial fibrillation presented with chest pain, NSTEMI. Warfarin held (last dose 9/15) with plans for cardiac cath. Patient was briefly on heparin  prior to cath earlier this admission. Patient now s/p CABG on 05/06/2024. Chest tubes and pacing wires have been removed. Pharmacy has been consulted for warfarin dosing. Discussed with TCTS, will restart heparin  without a bridge today.   PTA Warfarin: 2.5 mg on Monday and Thursdays, 5 mg EOD  9/26: INR 1.2 > 1.3>1.7>1.8, last Hgb 11.1, PLT 451. Significant movement in INR today.   Will give a 5 mg dose of warfarin today Continue enoxaparin  given INR < 2  Per RN, no signs of bleeding.  Goal of Therapy:  INR 2-3 Monitor platelets by anticoagulation protocol: Yes   Plan:  Warfarin 5 mg x 1 today Monitor CBC, INR,  and s/sx of bleeding daily  Prentice DOROTHA Favors, PharmD PGY1 Health-System Pharmacy Administration and Leadership Resident [facility]  05/13/2024 8:17 AM

## 2024-05-15 ENCOUNTER — Telehealth: Payer: Self-pay | Admitting: *Deleted

## 2024-05-15 NOTE — Transitions of Care (Post Inpatient/ED Visit) (Signed)
   05/15/2024  Name: [name] MRN: [id number] DOB: 01-01-1956  Today's TOC FU Call Status: Today's TOC FU Call Status:: Unsuccessful Call (1st Attempt) Unsuccessful Call (1st Attempt) Date: 05/15/24  Attempted to reach the patient regarding the most recent Inpatient/ED visit.  Follow Up Plan: Additional outreach attempts will be made to reach the patient to complete the Transitions of Care (Post Inpatient/ED visit) call.   Mliss Creed [facility], BSN RN Care Manager/ Transition of Care [place]/ [facility] [phone]

## 2024-05-16 ENCOUNTER — Telehealth: Payer: Self-pay

## 2024-05-16 DIAGNOSIS — E559 Vitamin D deficiency, unspecified: Secondary | ICD-10-CM | POA: Diagnosis not present

## 2024-05-16 DIAGNOSIS — G43109 Migraine with aura, not intractable, without status migrainosus: Secondary | ICD-10-CM | POA: Diagnosis not present

## 2024-05-16 DIAGNOSIS — I272 Pulmonary hypertension, unspecified: Secondary | ICD-10-CM | POA: Diagnosis not present

## 2024-05-16 DIAGNOSIS — Z794 Long term (current) use of insulin: Secondary | ICD-10-CM | POA: Diagnosis not present

## 2024-05-16 DIAGNOSIS — E782 Mixed hyperlipidemia: Secondary | ICD-10-CM | POA: Diagnosis not present

## 2024-05-16 DIAGNOSIS — E1142 Type 2 diabetes mellitus with diabetic polyneuropathy: Secondary | ICD-10-CM | POA: Diagnosis not present

## 2024-05-16 DIAGNOSIS — Z556 Problems related to health literacy: Secondary | ICD-10-CM | POA: Diagnosis not present

## 2024-05-16 DIAGNOSIS — Z7984 Long term (current) use of oral hypoglycemic drugs: Secondary | ICD-10-CM | POA: Diagnosis not present

## 2024-05-16 DIAGNOSIS — I10 Essential (primary) hypertension: Secondary | ICD-10-CM | POA: Diagnosis not present

## 2024-05-16 DIAGNOSIS — N3281 Overactive bladder: Secondary | ICD-10-CM | POA: Diagnosis not present

## 2024-05-16 DIAGNOSIS — G4733 Obstructive sleep apnea (adult) (pediatric): Secondary | ICD-10-CM | POA: Diagnosis not present

## 2024-05-16 DIAGNOSIS — I480 Paroxysmal atrial fibrillation: Secondary | ICD-10-CM | POA: Diagnosis not present

## 2024-05-16 DIAGNOSIS — Z48812 Encounter for surgical aftercare following surgery on the circulatory system: Secondary | ICD-10-CM | POA: Diagnosis not present

## 2024-05-16 DIAGNOSIS — I951 Orthostatic hypotension: Secondary | ICD-10-CM | POA: Diagnosis not present

## 2024-05-16 DIAGNOSIS — E1165 Type 2 diabetes mellitus with hyperglycemia: Secondary | ICD-10-CM | POA: Diagnosis not present

## 2024-05-16 DIAGNOSIS — Z7901 Long term (current) use of anticoagulants: Secondary | ICD-10-CM | POA: Diagnosis not present

## 2024-05-16 DIAGNOSIS — E039 Hypothyroidism, unspecified: Secondary | ICD-10-CM | POA: Diagnosis not present

## 2024-05-16 DIAGNOSIS — G2581 Restless legs syndrome: Secondary | ICD-10-CM | POA: Diagnosis not present

## 2024-05-16 DIAGNOSIS — I214 Non-ST elevation (NSTEMI) myocardial infarction: Secondary | ICD-10-CM | POA: Diagnosis not present

## 2024-05-16 DIAGNOSIS — J432 Centrilobular emphysema: Secondary | ICD-10-CM | POA: Diagnosis not present

## 2024-05-16 DIAGNOSIS — K760 Fatty (change of) liver, not elsewhere classified: Secondary | ICD-10-CM | POA: Diagnosis not present

## 2024-05-16 NOTE — Transitions of Care (Post Inpatient/ED Visit) (Signed)
 05/16/2024  Name: Rachel Odonnell MRN: [id number] DOB: 1956-04-17  Today's TOC FU Call Status: Today's TOC FU Call Status:: Successful TOC FU Call Completed TOC FU Call Complete Date: 05/16/24 Patient's Name and Date of Birth confirmed.  Transition Care Management Follow-up Telephone Call Date of Discharge: 05/13/24 Discharge Facility: [facility]) Type of Discharge: Inpatient Admission Primary Inpatient Discharge Diagnosis:: NSTEMI, s/p CABG How have you been since you were released from the hospital?: Better Any questions or concerns?: No  Items Reviewed: Did you receive and understand the discharge instructions provided?: Yes Medications obtained,verified, and reconciled?: Yes (Medications Reviewed) Any new allergies since your discharge?: No Dietary orders reviewed?: Yes Type of Diet Ordered:: Low Sodium Heart Healthy Do you have support at home?: Yes People in Home [RPT]: spouse Name of Support/Comfort Primary Source: [employer]  Medications Reviewed Today: Medications Reviewed Today     Reviewed by Moises Reusing, RN (Case Manager) on 05/16/24 at 1558  Med List Status: <None>   Medication Order Taking? Sig Documenting Provider Last Dose Status Informant  Accu-Chek Softclix Lancets lancets [id number]  Use as instructed to test blood glucose four times daily Nida, Gebreselassie W, MD  Active Self, Pharmacy Records  acetaminophen  (TYLENOL ) 325 MG tablet [id number]  Take 2 tablets (650 mg total) by mouth every 6 (six) hours as needed for headache or mild pain (pain score 1-3). Barrett, Rocky JONELLE RIGGERS  Active   aspirin  EC 325 MG tablet [id number]  Take 1 tablet (325 mg total) by mouth daily. Barrett, Rocky JONELLE, PA-C  Active   busPIRone  (BUSPAR ) 5 MG tablet [id number] No Take 5 mg by mouth at bedtime. [provider] 05/01/2024 Active Self, Pharmacy Records  Cholecalciferol  (VITAMIN D3) 125 MCG (5000 UT) CAPS [id number] No Take 1 capsule (5,000 Units total) by mouth daily.  Nida, Gebreselassie W, MD Past Week Active Self, Pharmacy Records  citalopram  (CELEXA ) 20 MG tablet [id number] No Take 1 tablet (20 mg total) by mouth every evening. Miriam Norris, NP 05/01/2024 Evening Active Self, Pharmacy Records  Continuous Glucose Receiver (FREESTYLE LIBRE 2 READER) DEVI [id number]  USE TO CHECK GLUCOSE AS DIRECTED Lenis Gebreselassie W, MD  Active Self, Pharmacy Records  Continuous Glucose Sensor (FREESTYLE LIBRE 2 SENSOR) [place] [id number]  CHANGE SENSOR EVERY 14 DAYS USE  TO CHECK BLOOD GLUCOSE  CONTINUOUSLY Nida, Gebreselassie W, MD  Active Self, Pharmacy Records  donepezil  (ARICEPT ) 5 MG tablet [id number] No Take 5 mg by mouth every evening. [provider] 05/01/2024 Evening Active Self, Pharmacy Records  empagliflozin  (JARDIANCE ) 25 MG TABS tablet [id number] No Take 1 tablet (25 mg total) by mouth daily. Nida, Gebreselassie W, MD 05/01/2024 Morning Active Self, Pharmacy Records  estradiol  (ESTRACE ) 1 MG tablet [id number] No Take 1 mg by mouth daily. [provider] 05/01/2024 Evening Active Self, Pharmacy Records  fenofibrate  160 MG tablet [id number]  Take 1 tablet (160 mg total) by mouth daily. Barrett, Rocky JONELLE, PA-C  Active   furosemide  (LASIX ) 40 MG tablet [id number]  Take 1 tablet (40 mg total) by mouth daily. Barrett, Rocky JONELLE, PA-C  Active   gabapentin  (NEURONTIN ) 100 MG capsule [id number] No Take 1 capsule (100 mg total) by mouth at bedtime. Dunn, Dayna N, PA-C 05/01/2024 Bedtime Active Self, Pharmacy Records  glucose blood test strip [id number]  1 each by Other route as needed. Use as instructed to test blood glucose four times daily Nida, Gebreselassie W, MD  Active Self, Pharmacy Records  insulin  degludec (TRESIBA  FLEXTOUCH) 200 UNIT/ML FlexTouch  Pen [id number] No Inject 110 Units into the skin at bedtime. Nida, Gebreselassie W, MD 05/01/2024 Bedtime Active Self, Pharmacy Records  insulin  lispro (HUMALOG  KWIKPEN) 100 UNIT/ML KwikPen [id number] No Inject 16-22 Units  into the skin 3 (three) times daily before meals. Nida, Gebreselassie W, MD Past Week Active Self, Pharmacy Records  Insulin  Pen Needle (B-D ULTRAFINE III SHORT PEN) 31G X 8 MM MISC [id number]  1 each by Does not apply route as directed. Nida, Gebreselassie W, MD  Active Self, Pharmacy Records  levocetirizine (XYZAL ) 5 MG tablet [id number] No Take 5 mg by mouth every evening. [provider] 05/01/2024 Evening Active Self, Pharmacy Records  levothyroxine  (SYNTHROID ) 50 MCG tablet [id number] No TAKE 1 TABLET BY MOUTH DAILY  BEFORE BREAKFAST Lenis Ethelle ORN, MD 05/01/2024 Evening Active Self, Pharmacy Records  Magnesium  Oxide (MAGNESIUM  OXIDE 400) 240 MG PACK [id number] No Take 400 mg by mouth daily. Miriam Norris, NP 05/01/2024 Morning Active Self, Pharmacy Records  metFORMIN  (GLUCOPHAGE ) 500 MG tablet [id number] No TAKE 1 TABLET BY MOUTH DAILY  WITH BREAKFAST  Patient taking differently: Take 500 mg by mouth every evening.   Nida, Gebreselassie W, MD 05/01/2024 Evening Active Self, Pharmacy Records  modafinil  (PROVIGIL ) 100 MG tablet [id number] No Take 1 tablet (100 mg total) by mouth daily. Jude Harden GAILS, MD 05/01/2024 Evening Active Self, Pharmacy Records  montelukast  (SINGULAIR ) 10 MG tablet [id number] No Take 10 mg by mouth at bedtime. [provider] 05/01/2024 Bedtime Active Self, Pharmacy Records  Multiple Vitamin (MULTIVITAMIN ADULT PO) [id number] No Take 1 tablet by mouth daily. [provider] 05/01/2024 Morning Active Self, Pharmacy Records  OVER THE COUNTER MEDICATION [id number] No Take 2 capsules by mouth daily. Omega XL [provider] 05/01/2024 Morning Active Self, Pharmacy Records  oxyCODONE  (OXY IR/ROXICODONE ) 5 MG immediate release tablet [id number]  Take 1 tablet (5 mg total) by mouth every 4 (four) hours as needed for severe pain (pain score 7-10). Barrett, Rocky SAUNDERS, PA-C  Active   pantoprazole  (PROTONIX ) 40 MG tablet [id number] No TAKE 2 TABLETS EVERY DAY  Miriam Norris, NP 05/01/2024 Evening Active Self, Pharmacy Records  potassium chloride  SA (KLOR-CON  M) 20 MEQ tablet [id number]  Take 1 tablet (20 mEq total) by mouth daily. Barrett, Rocky SAUNDERS, PA-C  Active   rOPINIRole  (REQUIP ) 2 MG tablet [id number] No Take 2 mg by mouth at bedtime. [provider] 05/01/2024 Bedtime Active Self, Pharmacy Records  rosuvastatin  (CRESTOR ) 40 MG tablet [id number] No Take 1 tablet (40 mg total) by mouth daily. Dunn, Dayna N, PA-C 05/01/2024 Evening Active Self, Pharmacy Records  Semaglutide ,0.25 or 0.5MG /DOS, (OZEMPIC , 0.25 OR 0.5 MG/DOSE,) 2 MG/1.5ML SOPN [id number] No Inject 0.5 mg into the skin once a week. [provider] 04/27/2024 Active Self, Pharmacy Records  Vibegron  (GEMTESA ) 75 MG TABS [id number] No Take 1 tablet (75 mg total) by mouth daily. Gerldine Lauraine BROCKS, FNP 05/01/2024 Evening Active Self, Pharmacy Records           Med Note DARILYN, DAWN S   Tue May 02, 2024  7:54 AM) Samples from Doctor.  warfarin (COUMADIN ) 5 MG tablet [id number] No TAKE ONE-HALF TABLET BY MOUTH  DAILY EXCEPT 1 TABLET BY MOUTH  ON SUNDAYS, TUESDAYS, THURSDAYS, SATURDAYS OR AS DIRECTED  Patient taking differently: Take 5 mg by mouth. Take one tablet every day except on Monday and Thursday take 1/2 tablet.   Alvan Dorn FALCON, MD 05/01/2024  9:30 PM Active Self, Pharmacy Records  Home Care and Equipment/Supplies: Were Home Health Services Ordered?: Yes Name of Home Health Agency:: [facility] Has Agency set up a time to come to your home?: Yes First Home Health Visit Date: 05/16/24 Any new equipment or medical supplies ordered?: Yes Name of Medical supply agency?: [facility] Were you able to get the equipment/medical supplies?: Yes Do you have any questions related to the use of the equipment/supplies?: No  Functional Questionnaire: Do you need assistance with bathing/showering or dressing?: No Do you need assistance with meal preparation?: No Do you need  assistance with eating?: No Do you have difficulty maintaining continence: No Do you need assistance with getting out of bed/getting out of a chair/moving?: No Do you have difficulty managing or taking your medications?: No  Follow up appointments reviewed: PCP Follow-up appointment confirmed?: Yes Date of PCP follow-up appointment?: 05/26/24 Follow-up Provider: [facility] Follow-up appointment confirmed?: Yes Date of Specialist follow-up appointment?: 05/26/24 Follow-Up Specialty Provider:: Helayne Rayas Do you need transportation to your follow-up appointment?: No Do you understand care options if your condition(s) worsen?: Yes-patient verbalized understanding  SDOH Interventions Today    Flowsheet Row Most Recent Value  SDOH Interventions   Food Insecurity Interventions Intervention Not Indicated  Housing Interventions Intervention Not Indicated  Transportation Interventions Intervention Not Indicated  Utilities Interventions Intervention Not Indicated    Medford Balboa, BSN, RN [place]  [facility]BCI - [facility] RN Care Manager [phone]

## 2024-05-17 ENCOUNTER — Ambulatory Visit: Attending: Cardiology | Admitting: *Deleted

## 2024-05-17 DIAGNOSIS — I4821 Permanent atrial fibrillation: Secondary | ICD-10-CM | POA: Diagnosis not present

## 2024-05-17 DIAGNOSIS — Z5181 Encounter for therapeutic drug level monitoring: Secondary | ICD-10-CM

## 2024-05-17 LAB — POCT INR: INR: 3.6 — AB (ref 2.0–3.0)

## 2024-05-17 NOTE — Progress Notes (Signed)
 INR 3.6  Please see anticoagulation encounter

## 2024-05-17 NOTE — Patient Instructions (Signed)
 Hold warfarin tonight then decrease dose to 1 tablet daily except 1/2 tablet on Mondays, Wednesdays and Fridays  Recheck INR in 2 weeks

## 2024-05-19 ENCOUNTER — Telehealth: Payer: Self-pay | Admitting: Cardiology

## 2024-05-19 DIAGNOSIS — Z794 Long term (current) use of insulin: Secondary | ICD-10-CM | POA: Diagnosis not present

## 2024-05-19 DIAGNOSIS — E039 Hypothyroidism, unspecified: Secondary | ICD-10-CM | POA: Diagnosis not present

## 2024-05-19 DIAGNOSIS — I10 Essential (primary) hypertension: Secondary | ICD-10-CM | POA: Diagnosis not present

## 2024-05-19 DIAGNOSIS — G2581 Restless legs syndrome: Secondary | ICD-10-CM | POA: Diagnosis not present

## 2024-05-19 DIAGNOSIS — E782 Mixed hyperlipidemia: Secondary | ICD-10-CM | POA: Diagnosis not present

## 2024-05-19 DIAGNOSIS — Z556 Problems related to health literacy: Secondary | ICD-10-CM | POA: Diagnosis not present

## 2024-05-19 DIAGNOSIS — G4733 Obstructive sleep apnea (adult) (pediatric): Secondary | ICD-10-CM | POA: Diagnosis not present

## 2024-05-19 DIAGNOSIS — G43109 Migraine with aura, not intractable, without status migrainosus: Secondary | ICD-10-CM | POA: Diagnosis not present

## 2024-05-19 DIAGNOSIS — E1142 Type 2 diabetes mellitus with diabetic polyneuropathy: Secondary | ICD-10-CM | POA: Diagnosis not present

## 2024-05-19 DIAGNOSIS — J432 Centrilobular emphysema: Secondary | ICD-10-CM | POA: Diagnosis not present

## 2024-05-19 DIAGNOSIS — I272 Pulmonary hypertension, unspecified: Secondary | ICD-10-CM | POA: Diagnosis not present

## 2024-05-19 DIAGNOSIS — I480 Paroxysmal atrial fibrillation: Secondary | ICD-10-CM | POA: Diagnosis not present

## 2024-05-19 DIAGNOSIS — Z7984 Long term (current) use of oral hypoglycemic drugs: Secondary | ICD-10-CM | POA: Diagnosis not present

## 2024-05-19 DIAGNOSIS — I951 Orthostatic hypotension: Secondary | ICD-10-CM | POA: Diagnosis not present

## 2024-05-19 DIAGNOSIS — I214 Non-ST elevation (NSTEMI) myocardial infarction: Secondary | ICD-10-CM | POA: Diagnosis not present

## 2024-05-19 DIAGNOSIS — K760 Fatty (change of) liver, not elsewhere classified: Secondary | ICD-10-CM | POA: Diagnosis not present

## 2024-05-19 DIAGNOSIS — N3281 Overactive bladder: Secondary | ICD-10-CM | POA: Diagnosis not present

## 2024-05-19 DIAGNOSIS — Z48812 Encounter for surgical aftercare following surgery on the circulatory system: Secondary | ICD-10-CM | POA: Diagnosis not present

## 2024-05-19 DIAGNOSIS — Z7901 Long term (current) use of anticoagulants: Secondary | ICD-10-CM | POA: Diagnosis not present

## 2024-05-19 DIAGNOSIS — E559 Vitamin D deficiency, unspecified: Secondary | ICD-10-CM | POA: Diagnosis not present

## 2024-05-19 NOTE — Telephone Encounter (Signed)
 This must come from prescribing Provider. This is not a [facility] Medication.

## 2024-05-19 NOTE — Telephone Encounter (Signed)
*  STAT* If patient is at the pharmacy, call can be transferred to refill team.   1. Which medications need to be refilled? (please list name of each medication and dose if known)   oxyCODONE  (OXY IR/ROXICODONE ) 5 MG immediate release tablet   2. Would you like to learn more about the convenience, safety, & potential cost savings by using the [facility]?   3. Are you open to using the [facility] (Type [facility]. ).  4. Which pharmacy/location (including street and city if local pharmacy) is medication to be sent to?  [facility] [address],  - [address]   5. Do they need a 30 day or 90 day supply?   Patient stated she has about 10 tablets left.   Patient has appointment scheduled with Rachel Crate NP on 10/16.

## 2024-05-24 ENCOUNTER — Encounter

## 2024-05-26 ENCOUNTER — Ambulatory Visit
Attending: Thoracic Surgery (Cardiothoracic Vascular Surgery) | Admitting: Thoracic Surgery (Cardiothoracic Vascular Surgery)

## 2024-05-26 DIAGNOSIS — Z789 Other specified health status: Secondary | ICD-10-CM

## 2024-05-26 DIAGNOSIS — E1165 Type 2 diabetes mellitus with hyperglycemia: Secondary | ICD-10-CM | POA: Diagnosis not present

## 2024-05-26 DIAGNOSIS — I4891 Unspecified atrial fibrillation: Secondary | ICD-10-CM | POA: Diagnosis not present

## 2024-05-26 DIAGNOSIS — Z23 Encounter for immunization: Secondary | ICD-10-CM | POA: Diagnosis not present

## 2024-05-26 DIAGNOSIS — R6884 Jaw pain: Secondary | ICD-10-CM | POA: Diagnosis not present

## 2024-05-26 DIAGNOSIS — R079 Chest pain, unspecified: Secondary | ICD-10-CM | POA: Diagnosis not present

## 2024-05-26 NOTE — Progress Notes (Signed)
     [address]       [place] [address]             [phone]       Patient: Home Provider: Office Consent for Telemedicine visit obtained.  Today's visit was completed via a real-time telehealth (see specific modality noted below). The patient/authorized person provided oral consent at the time of the visit to engage in a telemedicine encounter with the present provider at [facility]. The patient/authorized person was informed of the potential benefits, limitations, and risks of telemedicine. The patient/authorized person expressed understanding that the laws that protect confidentiality also apply to telemedicine. The patient/authorized person acknowledged understanding that telemedicine does not provide emergency services and that he or she would need to call 911 or proceed to the nearest hospital for help if such a need arose.   Total time spent in the clinical discussion 10 minutes.  Telehealth Modality: Phone visit (audio only)  I had a telephone visit with  BRITTANNY LEVENHAGEN who is s/p CABG.  Overall doing well.  Pain is minimal.  Ambulating well. Vitals have been stable.  DELSA WALDER will see us  back in 1 month with a chest x-ray for cardiac rehab clearance.  Lucendia Leard MALVA Rayas

## 2024-05-29 ENCOUNTER — Other Ambulatory Visit: Payer: Self-pay | Admitting: "Endocrinology

## 2024-05-29 DIAGNOSIS — E1159 Type 2 diabetes mellitus with other circulatory complications: Secondary | ICD-10-CM

## 2024-05-31 ENCOUNTER — Encounter

## 2024-06-01 ENCOUNTER — Ambulatory Visit: Admitting: Nurse Practitioner

## 2024-06-05 ENCOUNTER — Ambulatory Visit: Attending: Cardiology | Admitting: *Deleted

## 2024-06-05 DIAGNOSIS — I4821 Permanent atrial fibrillation: Secondary | ICD-10-CM

## 2024-06-05 DIAGNOSIS — Z5181 Encounter for therapeutic drug level monitoring: Secondary | ICD-10-CM

## 2024-06-05 LAB — POCT INR: INR: 5.9 — AB (ref 2.0–3.0)

## 2024-06-05 NOTE — Progress Notes (Signed)
 INR 5.9 Please see anticoagulation encounter

## 2024-06-05 NOTE — Patient Instructions (Signed)
 Hold warfarin x 3 days then decrease dose to 1/2 tablet daily except 1 tablet on Sundays, Tuesdays and Thursdays  Recheck INR in 1 week

## 2024-06-06 ENCOUNTER — Telehealth: Payer: Self-pay | Admitting: Cardiology

## 2024-06-06 NOTE — Telephone Encounter (Signed)
 Pt c/o medication issue:  1. Name of Medication: warfarin (COUMADIN ) 5 MG tablet   2. How are you currently taking this medication (dosage and times per day)?    3. Are you having a reaction (difficulty breathing--STAT)? no  4. What is your medication issue? Patient calling in because she is taking tylenol  and wanted to make sure that was okay to do with taking warfarin. Please advise

## 2024-06-06 NOTE — Telephone Encounter (Signed)
 Called and spoke with patient.  Informed it's ok to take Tylenol  with warfarin.  She is taking it for inflammation after CABG.

## 2024-06-12 DIAGNOSIS — E1165 Type 2 diabetes mellitus with hyperglycemia: Secondary | ICD-10-CM | POA: Diagnosis not present

## 2024-06-12 DIAGNOSIS — I4891 Unspecified atrial fibrillation: Secondary | ICD-10-CM | POA: Diagnosis not present

## 2024-06-12 DIAGNOSIS — R6884 Jaw pain: Secondary | ICD-10-CM | POA: Diagnosis not present

## 2024-06-12 DIAGNOSIS — R079 Chest pain, unspecified: Secondary | ICD-10-CM | POA: Diagnosis not present

## 2024-06-13 DIAGNOSIS — E039 Hypothyroidism, unspecified: Secondary | ICD-10-CM | POA: Diagnosis not present

## 2024-06-13 DIAGNOSIS — N3281 Overactive bladder: Secondary | ICD-10-CM | POA: Diagnosis not present

## 2024-06-13 DIAGNOSIS — Z7901 Long term (current) use of anticoagulants: Secondary | ICD-10-CM | POA: Diagnosis not present

## 2024-06-13 DIAGNOSIS — I480 Paroxysmal atrial fibrillation: Secondary | ICD-10-CM | POA: Diagnosis not present

## 2024-06-13 DIAGNOSIS — G43109 Migraine with aura, not intractable, without status migrainosus: Secondary | ICD-10-CM | POA: Diagnosis not present

## 2024-06-13 DIAGNOSIS — E1142 Type 2 diabetes mellitus with diabetic polyneuropathy: Secondary | ICD-10-CM | POA: Diagnosis not present

## 2024-06-13 DIAGNOSIS — I214 Non-ST elevation (NSTEMI) myocardial infarction: Secondary | ICD-10-CM | POA: Diagnosis not present

## 2024-06-13 DIAGNOSIS — K760 Fatty (change of) liver, not elsewhere classified: Secondary | ICD-10-CM | POA: Diagnosis not present

## 2024-06-13 DIAGNOSIS — E559 Vitamin D deficiency, unspecified: Secondary | ICD-10-CM | POA: Diagnosis not present

## 2024-06-13 DIAGNOSIS — I10 Essential (primary) hypertension: Secondary | ICD-10-CM | POA: Diagnosis not present

## 2024-06-13 DIAGNOSIS — Z7984 Long term (current) use of oral hypoglycemic drugs: Secondary | ICD-10-CM | POA: Diagnosis not present

## 2024-06-13 DIAGNOSIS — Z48812 Encounter for surgical aftercare following surgery on the circulatory system: Secondary | ICD-10-CM | POA: Diagnosis not present

## 2024-06-13 DIAGNOSIS — G4733 Obstructive sleep apnea (adult) (pediatric): Secondary | ICD-10-CM | POA: Diagnosis not present

## 2024-06-13 DIAGNOSIS — J432 Centrilobular emphysema: Secondary | ICD-10-CM | POA: Diagnosis not present

## 2024-06-13 DIAGNOSIS — E782 Mixed hyperlipidemia: Secondary | ICD-10-CM | POA: Diagnosis not present

## 2024-06-13 DIAGNOSIS — I272 Pulmonary hypertension, unspecified: Secondary | ICD-10-CM | POA: Diagnosis not present

## 2024-06-13 DIAGNOSIS — G2581 Restless legs syndrome: Secondary | ICD-10-CM | POA: Diagnosis not present

## 2024-06-13 DIAGNOSIS — Z556 Problems related to health literacy: Secondary | ICD-10-CM | POA: Diagnosis not present

## 2024-06-13 DIAGNOSIS — Z794 Long term (current) use of insulin: Secondary | ICD-10-CM | POA: Diagnosis not present

## 2024-06-13 DIAGNOSIS — I951 Orthostatic hypotension: Secondary | ICD-10-CM | POA: Diagnosis not present

## 2024-06-14 ENCOUNTER — Ambulatory Visit: Attending: Cardiology | Admitting: *Deleted

## 2024-06-14 DIAGNOSIS — Z5181 Encounter for therapeutic drug level monitoring: Secondary | ICD-10-CM | POA: Diagnosis not present

## 2024-06-14 DIAGNOSIS — I4821 Permanent atrial fibrillation: Secondary | ICD-10-CM

## 2024-06-14 LAB — POCT INR: INR: 2.7 (ref 2.0–3.0)

## 2024-06-14 NOTE — Patient Instructions (Signed)
 Continue warfarin 1/2 tablet daily except 1 tablet on Sundays, Tuesdays and Thursdays  Recheck INR in 1 week

## 2024-06-14 NOTE — Progress Notes (Signed)
 INR 2.7; Please see anticoagulation encounter

## 2024-06-21 ENCOUNTER — Ambulatory Visit: Attending: Cardiology | Admitting: *Deleted

## 2024-06-21 ENCOUNTER — Other Ambulatory Visit: Payer: Self-pay | Admitting: Thoracic Surgery (Cardiothoracic Vascular Surgery)

## 2024-06-21 DIAGNOSIS — Z5181 Encounter for therapeutic drug level monitoring: Secondary | ICD-10-CM

## 2024-06-21 DIAGNOSIS — Z951 Presence of aortocoronary bypass graft: Secondary | ICD-10-CM

## 2024-06-21 DIAGNOSIS — I4821 Permanent atrial fibrillation: Secondary | ICD-10-CM

## 2024-06-21 LAB — POCT INR: INR: 4.4 — AB (ref 2.0–3.0)

## 2024-06-21 NOTE — Patient Instructions (Signed)
 Hold warfarin tonight then decrease dose to 1/2 tablet daily except 1 tablet on Sundays Recheck INR in 1 week

## 2024-06-21 NOTE — Progress Notes (Signed)
 Rachel Odonnell                                          MRN: [id number]   06/21/2024   The VBCI Quality Team Specialist reviewed this patient medical record for the purposes of chart review for care gap closure. The following were reviewed: abstraction for care gap closure-kidney health evaluation for diabetes:eGFR  and uACR.    VBCI Quality Team

## 2024-06-21 NOTE — Progress Notes (Signed)
 INR 4.4 Please see anticoagulation encounter

## 2024-06-21 NOTE — Progress Notes (Signed)
 Rachel Odonnell                                          MRN: [id number]   06/21/2024   The VBCI Quality Team Specialist reviewed this patient medical record for the purposes of chart review for care gap closure. The following were reviewed: chart review for care gap closure-glycemic status assessment.    VBCI Quality Team

## 2024-06-23 ENCOUNTER — Ambulatory Visit (HOSPITAL_COMMUNITY)
Admission: RE | Admit: 2024-06-23 | Discharge: 2024-06-23 | Disposition: A | Discharge status: Home / Self Care | Source: Ambulatory Visit | Attending: Internal Medicine | Admitting: Internal Medicine

## 2024-06-23 ENCOUNTER — Ambulatory Visit

## 2024-06-23 VITALS — BP 131/73 | HR 85 | Resp 20 | Wt 217.2 lb

## 2024-06-23 DIAGNOSIS — Z951 Presence of aortocoronary bypass graft: Secondary | ICD-10-CM

## 2024-06-23 DIAGNOSIS — Z789 Other specified health status: Secondary | ICD-10-CM

## 2024-06-23 NOTE — Progress Notes (Signed)
 [address] [place] [address]             [phone]       HPI:  Patient returns for routine postoperative follow-up having undergone Off pump CABG X 1, LIMA to LAD on 05/06/2024 with Dr. Shyrl.  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 She was stable for discharge home on 05/13/2024.  Since hospital discharge the patient reports that she has been doing well.  She still has some sternal/incisional pain.  She has been using tylenol  which is helpful.  She is active around her house.  Her incision has been healing well. She denies chest pain, shortness of breath and lower leg edema.   Allergies as of 06/23/2024       Reactions   Azithromycin Hives   Codeine Nausea Only   Atomoxetine Nausea And Vomiting   Atorvastatin Other (See Comments)   Restless legs   Lisinopril Nausea And Vomiting        Medication List        Accurate as of June 23, 2024 11:28 AM. If you have any questions, ask your nurse or doctor.          STOP taking these medications    furosemide  40 MG tablet Commonly known as: Lasix  Stopped by: Manuelita CHRISTELLA Rough   oxyCODONE  5 MG immediate release tablet Commonly known as: Oxy IR/ROXICODONE  Stopped by: Manuelita CHRISTELLA Rough       TAKE these medications    Accu-Chek Softclix Lancets lancets Use as instructed to test blood glucose four times daily   acetaminophen  325 MG tablet Commonly known as: TYLENOL  Take 2 tablets (650 mg total) by mouth every 6 (six) hours as needed for headache or mild pain (pain score 1-3).   aspirin  EC 325 MG tablet Take 1 tablet (325 mg total) by mouth daily.   B-D ULTRAFINE III SHORT PEN 31G X 8 MM Misc Generic drug: Insulin  Pen Needle 1 each by Does not apply route as directed.   busPIRone  5 MG tablet Commonly known as: BUSPAR  Take 5 mg by mouth at bedtime.    citalopram  20 MG tablet Commonly known as: CELEXA  Take 1 tablet (20 mg total) by mouth every evening.   donepezil  5 MG tablet Commonly known as: ARICEPT  Take 5 mg by mouth every evening.   empagliflozin  25 MG Tabs tablet Commonly known as: Jardiance  Take 1 tablet (25 mg total) by mouth daily.   estradiol  1 MG tablet Commonly known as: ESTRACE  Take 1 mg by mouth daily.   fenofibrate  160 MG tablet Take 1 tablet (160 mg total) by mouth daily.   FreeStyle Libre 2 Reader Espiridion USE TO CHECK GLUCOSE AS DIRECTED   FreeStyle Libre 2 Sensor Misc CHANGE SENSOR EVERY 14 DAYS USE  TO CHECK BLOOD GLUCOSE  CONTINUOUSLY   gabapentin  100 MG capsule Commonly known as: Neurontin  Take 1 capsule (100 mg total) by mouth at bedtime.   Gemtesa  75 MG Tabs Generic drug: Vibegron  Take 1 tablet (75 mg total) by mouth daily.   glucose blood test strip 1 each by Other route as needed. Use as instructed to test blood glucose four times daily   HumaLOG  KwikPen 100 UNIT/ML KwikPen Generic drug: insulin  lispro INJECT SUBCUTANEOUSLY 16 TO 22  UNITS INTO THE SKIN 3 TIMES  DAILY BEFORE MEALS   levocetirizine  5 MG tablet Commonly known as: XYZAL  Take 5 mg by mouth every evening.   levothyroxine  50 MCG tablet Commonly known as: SYNTHROID  TAKE 1 TABLET BY MOUTH DAILY  BEFORE BREAKFAST   Magnesium  Oxide 400 240 MG Pack Generic drug: Magnesium  Oxide Take 400 mg by mouth daily.   metFORMIN  500 MG tablet Commonly known as: GLUCOPHAGE  TAKE 1 TABLET BY MOUTH DAILY  WITH BREAKFAST What changed: when to take this   modafinil  100 MG tablet Commonly known as: PROVIGIL  Take 1 tablet (100 mg total) by mouth daily.   montelukast  10 MG tablet Commonly known as: SINGULAIR  Take 10 mg by mouth at bedtime.   MULTIVITAMIN ADULT PO Take 1 tablet by mouth daily.   OVER THE COUNTER MEDICATION Take 2 capsules by mouth daily. Omega XL   Ozempic  (0.25 or 0.5 MG/DOSE) 2 MG/1.5ML Sopn Generic drug:  Semaglutide (0.25 or 0.5MG /DOS) Inject 0.5 mg into the skin once a week.   pantoprazole  40 MG tablet Commonly known as: PROTONIX  TAKE 2 TABLETS EVERY DAY   potassium chloride  SA 20 MEQ tablet Commonly known as: KLOR-CON  M Take 1 tablet (20 mEq total) by mouth daily.   rOPINIRole  2 MG tablet Commonly known as: REQUIP  Take 2 mg by mouth at bedtime.   rosuvastatin  40 MG tablet Commonly known as: Crestor  Take 1 tablet (40 mg total) by mouth daily.   Tresiba  FlexTouch 200 UNIT/ML FlexTouch Pen Generic drug: insulin  degludec Inject 110 Units into the skin at bedtime.   Vitamin D3 125 MCG (5000 UT) Caps Take 1 capsule (5,000 Units total) by mouth daily.   warfarin 5 MG tablet Commonly known as: COUMADIN  Take as directed by the anticoagulation clinic. If you are unsure how to take this medication, talk to your nurse or doctor. Original instructions: TAKE ONE-HALF TABLET BY MOUTH  DAILY EXCEPT 1 TABLET BY MOUTH  ON SUNDAYS, TUESDAYS, THURSDAYS, SATURDAYS OR AS DIRECTED What changed:  how much to take how to take this additional instructions         ROS  Review of Systems  Respiratory: Negative.  Negative for cough and shortness of breath.   Cardiovascular: Negative.  Negative for chest pain, palpitations and leg swelling.     BP 131/73 (BP Location: Right Arm, Patient Position: Sitting, Cuff Size: Normal)   Pulse 85   Resp 20   Wt 217 lb 3.2 oz (98.5 kg)   SpO2 95% Comment: RA  BMI 37.28 kg/m   Physical Exam Constitutional:      Appearance: Normal appearance.  HENT:     Head: Normocephalic and atraumatic.  Cardiovascular:     Rate and Rhythm: Normal rate. Rhythm irregularly irregular.     Heart sounds: Normal heart sounds, S1 normal and S2 normal.  Pulmonary:     Effort: Pulmonary effort is normal.     Breath sounds: Normal breath sounds.  Musculoskeletal:     Cervical back: Normal range of motion.  Skin:    General: Skin is warm and dry.       Neurological:     General: No focal deficit present.     Mental Status: She is alert.       Imaging: EXAM: 2 VIEW(S) XRAY OF THE CHEST 06/23/2024 11:02:00 AM   COMPARISON: 05/12/2024   CLINICAL HISTORY: cabg   FINDINGS:   LUNGS AND PLEURA: No focal pulmonary opacity. No pulmonary edema. No pleural effusion. No pneumothorax.   HEART AND MEDIASTINUM: Coronary calcifications. No acute abnormality of the cardiac and  mediastinal silhouettes.   BONES AND SOFT TISSUES: Sternal wires noted. No acute osseous abnormality.   IMPRESSION: 1. No acute cardiopulmonary process.   Electronically signed by: Katheleen Faes MD 06/23/2024 11:29 AM EST RP Workstation: [id number]   Assessment/Plan:  CABG (coronary artery bypass graft) planned -We reviewed today's chest x ray.  -We discussed driving and can start since she is not requiring narcotic for pain.  First time driving should be a short distance in the day time and she can increase from there.  -Discussed participation in cardiac rehab and she is cleared from a surgical standpoint -Sternal precautions can be lifted since she is 6 weeks from surgery. She should continue to avoid lifting over 10 pounds until a full 3 months from surgery -She can increase her activity/exercise as tolerated  -She has been followed by the anticoagulation clinic for her permanent atrial fibrillation. Last INR was 4.4 and they have made adjustments to coumadin . She should continue follow up as scheduled -She has not been seen by cardiology and has an appointment on 06/28/2024.  -Follow up with [facility] as needed  Manuelita CHRISTELLA Rough, PA-C 11:28 AM 06/23/24

## 2024-06-23 NOTE — Patient Instructions (Signed)

## 2024-06-26 ENCOUNTER — Encounter (HOSPITAL_COMMUNITY): Payer: Self-pay

## 2024-06-26 ENCOUNTER — Encounter (HOSPITAL_COMMUNITY)
Admission: RE | Admit: 2024-06-26 | Discharge: 2024-06-26 | Disposition: A | Discharge status: Home / Self Care | Source: Ambulatory Visit | Attending: Cardiology | Admitting: Cardiology

## 2024-06-26 DIAGNOSIS — Z955 Presence of coronary angioplasty implant and graft: Secondary | ICD-10-CM | POA: Insufficient documentation

## 2024-06-26 DIAGNOSIS — Z951 Presence of aortocoronary bypass graft: Secondary | ICD-10-CM | POA: Insufficient documentation

## 2024-06-26 DIAGNOSIS — I214 Non-ST elevation (NSTEMI) myocardial infarction: Secondary | ICD-10-CM | POA: Insufficient documentation

## 2024-06-28 ENCOUNTER — Ambulatory Visit

## 2024-06-28 ENCOUNTER — Encounter (HOSPITAL_COMMUNITY)
Admission: RE | Admit: 2024-06-28 | Discharge: 2024-06-28 | Disposition: A | Discharge status: Home / Self Care | Source: Ambulatory Visit | Attending: Cardiology | Admitting: Cardiology

## 2024-06-28 VITALS — Ht 67.0 in | Wt 213.2 lb

## 2024-06-28 DIAGNOSIS — I214 Non-ST elevation (NSTEMI) myocardial infarction: Secondary | ICD-10-CM

## 2024-06-28 DIAGNOSIS — Z951 Presence of aortocoronary bypass graft: Secondary | ICD-10-CM | POA: Diagnosis present

## 2024-06-28 DIAGNOSIS — Z955 Presence of coronary angioplasty implant and graft: Secondary | ICD-10-CM | POA: Diagnosis present

## 2024-06-28 NOTE — Progress Notes (Signed)
 Patient attend orientation today.  Patient is attending Cardiac Rehabilitation Program.  Documentation for diagnosis can be found in [facility].  Reviewed medical chart, RPE/RPD, gym safety, and program guidelines.  Patient was fitted to equipment they will be using during rehab.  Patient is scheduled to start exercise on 07/06/24.   Initial ITP created and sent for review and signature by Dr. Dorn Ross, Medical Director for Cardiac Rehabilitation Program.

## 2024-06-28 NOTE — Progress Notes (Signed)
 Cardiac Individual Treatment Plan  Patient Details  Name: Rachel Odonnell MRN: [id number] Date of Birth: [birth year] Referring Provider:   Flowsheet Row CARDIAC REHAB PHASE II ORIENTATION from 06/28/2024 in [facility] CARDIAC REHABILITATION  Referring Provider Alvan Carrier MD    Initial Encounter Date:  Flowsheet Row CARDIAC REHAB PHASE II ORIENTATION from 06/28/2024 in [place] [facility]  Date 06/28/24    Visit Diagnosis: NSTEMI (non-ST elevated myocardial infarction) (HCC)  S/P CABG x 1  Patient's Home Medications on Admission:  Current Outpatient Medications:    Accu-Chek Softclix Lancets lancets, Use as instructed to test blood glucose four times daily, Disp: 100 each, Rfl: 2   acetaminophen  (TYLENOL ) 325 MG tablet, Take 2 tablets (650 mg total) by mouth every 6 (six) hours as needed for headache or mild pain (pain score 1-3)., Disp: , Rfl:    aspirin  EC 325 MG tablet, Take 1 tablet (325 mg total) by mouth daily., Disp: , Rfl:    busPIRone  (BUSPAR ) 5 MG tablet, Take 5 mg by mouth at bedtime., Disp: , Rfl:    Cholecalciferol  (VITAMIN D3) 125 MCG (5000 UT) CAPS, Take 1 capsule (5,000 Units total) by mouth daily., Disp: 90 capsule, Rfl: 0   citalopram  (CELEXA ) 20 MG tablet, Take 1 tablet (20 mg total) by mouth every evening., Disp: 30 tablet, Rfl: 4   Continuous Glucose Receiver (FREESTYLE LIBRE 2 READER) DEVI, USE TO CHECK GLUCOSE AS DIRECTED, Disp: 1 each, Rfl: 0   Continuous Glucose Sensor (FREESTYLE LIBRE 2 SENSOR) MISC, CHANGE SENSOR EVERY 14 DAYS USE  TO CHECK BLOOD GLUCOSE  CONTINUOUSLY, Disp: 6 each, Rfl: 3   donepezil  (ARICEPT ) 5 MG tablet, Take 5 mg by mouth every evening., Disp: , Rfl:    empagliflozin  (JARDIANCE ) 25 MG TABS tablet, Take 1 tablet (25 mg total) by mouth daily., Disp: 90 tablet, Rfl: 1   estradiol  (ESTRACE ) 1 MG tablet, Take 1 mg by mouth daily., Disp: , Rfl:    fenofibrate  160 MG tablet, Take 1 tablet (160 mg total) by mouth daily., Disp:  90 tablet, Rfl: 1   glucose blood test strip, 1 each by Other route as needed. Use as instructed to test blood glucose four times daily, Disp: 100 each, Rfl: 2   HUMALOG  KWIKPEN 100 UNIT/ML KwikPen, INJECT SUBCUTANEOUSLY 16 TO 22  UNITS INTO THE SKIN 3 TIMES  DAILY BEFORE MEALS, Disp: 60 mL, Rfl: 2   Insulin  Pen Needle (B-D ULTRAFINE III SHORT PEN) 31G X 8 MM MISC, 1 each by Does not apply route as directed., Disp: 100 each, Rfl: 2   levocetirizine (XYZAL ) 5 MG tablet, Take 5 mg by mouth every evening., Disp: , Rfl:    levothyroxine  (SYNTHROID ) 50 MCG tablet, TAKE 1 TABLET BY MOUTH DAILY  BEFORE BREAKFAST, Disp: 90 tablet, Rfl: 3   Magnesium  Oxide (MAGNESIUM  OXIDE 400) 240 MG PACK, Take 400 mg by mouth daily., Disp: , Rfl:    metFORMIN  (GLUCOPHAGE ) 500 MG tablet, TAKE 1 TABLET BY MOUTH DAILY  WITH BREAKFAST, Disp: 90 tablet, Rfl: 3   montelukast  (SINGULAIR ) 10 MG tablet, Take 10 mg by mouth at bedtime., Disp: , Rfl:    Multiple Vitamin (MULTIVITAMIN ADULT PO), Take 1 tablet by mouth daily., Disp: , Rfl:    OVER THE COUNTER MEDICATION, Take 2 capsules by mouth daily. Omega XL, Disp: , Rfl:    pantoprazole  (PROTONIX ) 40 MG tablet, TAKE 2 TABLETS EVERY DAY, Disp: 180 tablet, Rfl: 1   rOPINIRole  (REQUIP ) 2 MG tablet,  Take 2 mg by mouth at bedtime., Disp: , Rfl:    rosuvastatin  (CRESTOR ) 40 MG tablet, Take 1 tablet (40 mg total) by mouth daily., Disp: 30 tablet, Rfl: 5   Semaglutide ,0.25 or 0.5MG /DOS, (OZEMPIC , 0.25 OR 0.5 MG/DOSE,) 2 MG/1.5ML SOPN, Inject 0.5 mg into the skin once a week., Disp: , Rfl:    Vibegron  (GEMTESA ) 75 MG TABS, Take 1 tablet (75 mg total) by mouth daily., Disp: 30 tablet, Rfl: 11   warfarin (COUMADIN ) 5 MG tablet, TAKE ONE-HALF TABLET BY MOUTH  DAILY EXCEPT 1 TABLET BY MOUTH  ON SUNDAYS, TUESDAYS, THURSDAYS, SATURDAYS OR AS DIRECTED, Disp: 100 tablet, Rfl: 0   gabapentin  (NEURONTIN ) 100 MG capsule, Take 1 capsule (100 mg total) by mouth at bedtime., Disp: , Rfl:    insulin   degludec (TRESIBA  FLEXTOUCH) 200 UNIT/ML FlexTouch Pen, Inject 110 Units into the skin at bedtime., Disp: 24 mL, Rfl: 2   modafinil  (PROVIGIL ) 100 MG tablet, Take 1 tablet (100 mg total) by mouth daily., Disp: 90 tablet, Rfl: 0   potassium chloride  SA (KLOR-CON  M) 20 MEQ tablet, Take 1 tablet (20 mEq total) by mouth daily., Disp: 5 tablet, Rfl: 0  Past Medical History: Past Medical History:  Diagnosis Date   ADD (attention deficit disorder)    Diabetes mellitus without complication (HCC)    Hypertension     Tobacco Use: Social History   Tobacco Use  Smoking Status Former   Current packs/day: 0.00   Types: Cigarettes   Quit date: 08/17/1994   Years since quitting: 29.8  Smokeless Tobacco Never    Labs: Review Flowsheet  More data exists      Latest Ref Rng & Units 09/17/2023 05/03/2024 05/05/2024 05/06/2024 05/07/2024  Labs for ITP Cardiac and Pulmonary Rehab  Cholestrol 0 - 200 mg/dL - 850  - - -  LDL (calc) 0 - 99 mg/dL - 75  - - -  HDL-C >59 mg/dL - 34  - - -  Trlycerides <150 mg/dL - 798  - - -  Hemoglobin A1c 4.8 - 5.6 % - - 9.2  - -  PH, Arterial 7.35 - 7.45 - - - 7.329  7.281  7.240  7.295  7.255  7.387  7.347   PCO2 arterial 32 - 48 mmHg - - - 49.0  51.2  54.2  45.8  51.9  38.9  47.3   Bicarbonate 20.0 - 28.0 mmol/L 27.9  - - 25.7  23.7  22.8  22.2  23.0  23.6  25.9   TCO2 22 - 32 mmol/L - - - 27  25  24  24  25  25  25  26  27  27    Acid-base deficit 0.0 - 2.0 mmol/L - - - 1.0  3.0  5.0  4.0  4.0  1.0  -  O2 Saturation % 39.9  - - 98  97  92  92  90  97  97     Details       Multiple values from one day are sorted in reverse-chronological order          Exercise Target Goals: Exercise Program Goal: Individual exercise prescription set using results from initial 6 min walk test and THRR while considering  patient's activity barriers and safety.   Exercise Prescription Goal: Initial exercise prescription builds to 30-45 minutes a day of aerobic activity, 2-3  days per week.  Home exercise guidelines will be given to patient during program as part of exercise  prescription that the participant will acknowledge.   Education: Aerobic Exercise: - Group verbal and visual presentation on the components of exercise prescription. Introduces F.I.T.T principle from ACSM for exercise prescriptions.  Reviews F.I.T.T. principles of aerobic exercise including progression. Written material provided at class time.   Education: Resistance Exercise: - Group verbal and visual presentation on the components of exercise prescription. Introduces F.I.T.T principle from ACSM for exercise prescriptions  Reviews F.I.T.T. principles of resistance exercise including progression. Written material provided at class time.    Education: Exercise & Equipment Safety: - Individual verbal instruction and demonstration of equipment use and safety with use of the equipment.   Education: Exercise Physiology & General Exercise Guidelines: - Group verbal and written instruction with models to review the exercise physiology of the cardiovascular system and associated critical values. Provides general exercise guidelines with specific guidelines to those with heart or lung disease. Written material provided at class time. Flowsheet Row CARDIAC VIRTUAL BASED CARE from 06/26/2024 in [place] [facility]  Education need identified 06/26/24    Education: Flexibility, Balance, Mind/Body Relaxation: - Group verbal and visual presentation with interactive activity on the components of exercise prescription. Introduces F.I.T.T principle from ACSM for exercise prescriptions. Reviews F.I.T.T. principles of flexibility and balance exercise training including progression. Also discusses the mind body connection.  Reviews various relaxation techniques to help reduce and manage stress (i.e. Deep breathing, progressive muscle relaxation, and visualization). Balance handout provided to take  home. Written material provided at class time. Flowsheet Row CARDIAC VIRTUAL BASED CARE from 06/26/2024 in [place] [facility]  Education need identified 06/26/24    Activity Barriers & Risk Stratification:  Activity Barriers & Cardiac Risk Stratification - 06/28/24 1419       Activity Barriers & Cardiac Risk Stratification   Activity Barriers Arthritis;Back Problems;Deconditioning;Muscular Weakness;Incisional Pain;Chest Pain/Angina    Cardiac Risk Stratification High          6 Minute Walk:  6 Minute Walk     Row Name 06/28/24 1519         6 Minute Walk   Phase Initial     Distance 800 feet     Walk Time 6 minutes     # of Rest Breaks 1     MPH 1.52     METS 1.42     RPE 12     VO2 Peak 4.97     Symptoms Yes (comment)     Comments BL hip pain 4/10     Resting HR 79 bpm     Resting BP 122/70     Resting Oxygen Saturation  97 %     Exercise Oxygen Saturation  during 6 min walk 97 %     Max Ex. HR 85 bpm     Max Ex. BP 134/60     2 Minute Post BP 120/68        Oxygen Initial Assessment:   Oxygen Re-Evaluation:   Oxygen Discharge (Final Oxygen Re-Evaluation):   Initial Exercise Prescription:  Initial Exercise Prescription - 06/28/24 1500       Date of Initial Exercise RX and Referring Provider   Date 06/28/24    Referring Provider Alvan Carrier MD      Oxygen   Maintain Oxygen Saturation 88% or higher      NuStep   Level 1    SPM 50    Minutes 15    METs 1.9      Arm Ergometer   Level 1  RPM 30    Minutes 15    METs 1.9      Prescription Details   Frequency (times per week) 2    Duration Progress to 30 minutes of continuous aerobic without signs/symptoms of physical distress      Intensity   THRR 40-80% of Max Heartrate 108-137    Ratings of Perceived Exertion 11-13    Perceived Dyspnea 0-4      Resistance Training   Training Prescription Yes    Weight 3    Reps 10-15          Perform Capillary Blood  Glucose checks as needed.  Exercise Prescription Changes:   Exercise Comments:   Exercise Comments     Row Name 06/28/24 1500 06/28/24 1506         Exercise Comments Nathanel doesn't do much exercise at home besides a little walking. Patient attend orientation today.  Patient is attending Cardiac Rehabilitation Program.  Documentation for diagnosis can be found in [facility].  Reviewed medical chart, RPE/RPD, gym safety, and program guidelines.  Patient was fitted to equipment they will be using during rehab.  Patient is scheduled to start exercise on 07/06/24.   Initial ITP created and sent for review and signature by Dr. Dorn Ross, Medical Director for Cardiac Rehabilitation Program.         Exercise Goals and Review:   Exercise Goals     Row Name 06/28/24 1500             Exercise Goals   Increase Physical Activity Yes       Intervention Provide advice, education, support and counseling about physical activity/exercise needs.;Develop an individualized exercise prescription for aerobic and resistive training based on initial evaluation findings, risk stratification, comorbidities and participant's personal goals.       Expected Outcomes Short Term: Attend rehab on a regular basis to increase amount of physical activity.;Long Term: Add in home exercise to make exercise part of routine and to increase amount of physical activity.;Long Term: Exercising regularly at least 3-5 days a week.       Increase Strength and Stamina Yes       Intervention Provide advice, education, support and counseling about physical activity/exercise needs.;Develop an individualized exercise prescription for aerobic and resistive training based on initial evaluation findings, risk stratification, comorbidities and participant's personal goals.       Expected Outcomes Short Term: Increase workloads from initial exercise prescription for resistance, speed, and METs.;Long Term: Improve cardiorespiratory fitness,  muscular endurance and strength as measured by increased METs and functional capacity ( );Short Term: Perform resistance training exercises routinely during rehab and add in resistance training at home       Able to understand and use rate of perceived exertion (RPE) scale Yes       Intervention Provide education and explanation on how to use RPE scale       Expected Outcomes Short Term: Able to use RPE daily in rehab to express subjective intensity level;Long Term:  Able to use RPE to guide intensity level when exercising independently       Able to understand and use Dyspnea scale Yes       Intervention Provide education and explanation on how to use Dyspnea scale       Expected Outcomes Short Term: Able to use Dyspnea scale daily in rehab to express subjective sense of shortness of breath during exertion;Long Term: Able to use Dyspnea scale to guide intensity level when exercising independently  Knowledge and understanding of Target Heart Rate Range (THRR) Yes       Intervention Provide education and explanation of THRR including how the numbers were predicted and where they are located for reference       Expected Outcomes Short Term: Able to state/look up THRR;Long Term: Able to use THRR to govern intensity when exercising independently;Short Term: Able to use daily as guideline for intensity in rehab       Able to check pulse independently Yes       Intervention Review the importance of being able to check your own pulse for safety during independent exercise;Provide education and demonstration on how to check pulse in carotid and radial arteries.       Expected Outcomes Short Term: Able to explain why pulse checking is important during independent exercise;Long Term: Able to check pulse independently and accurately       Understanding of Exercise Prescription Yes       Intervention Provide education, explanation, and written materials on patient's individual exercise prescription        Expected Outcomes Short Term: Able to explain program exercise prescription;Long Term: Able to explain home exercise prescription to exercise independently          Exercise Goals Re-Evaluation :   Discharge Exercise Prescription (Final Exercise Prescription Changes):   Nutrition:  Target Goals: Understanding of nutrition guidelines, daily intake of sodium 1500mg , cholesterol 200mg , calories 30% from fat and 7% or less from saturated fats, daily to have 5 or more servings of fruits and vegetables.  Education: Nutrition 1 -Group instruction provided by verbal, written material, interactive activities, discussions, models, and posters to present general guidelines for heart healthy nutrition including macronutrients, label reading, and promoting whole foods over processed counterparts. Education serves as pensions consultant of discussion of heart healthy eating for all. Written material provided at class time. Flowsheet Row CARDIAC VIRTUAL BASED CARE from 06/26/2024 in [place] [facility]  Education need identified 06/26/24     Education: Nutrition 2 -Group instruction provided by verbal, written material, interactive activities, discussions, models, and posters to present general guidelines for heart healthy nutrition including sodium, cholesterol, and saturated fat. Providing guidance of habit forming to improve blood pressure, cholesterol, and body weight. Written material provided at class time. Flowsheet Row CARDIAC VIRTUAL BASED CARE from 06/26/2024 in [place] [facility]  Education need identified 06/26/24      Biometrics:  Pre Biometrics - 06/28/24 1522       Pre Biometrics   Height 5' 7 (1.702 m)    Weight 96.7 kg    Waist Circumference 41 inches    Hip Circumference 47 inches    Waist to Hip Ratio 0.87 %    BMI (Calculated) 33.38    Grip Strength 25.3 kg    Single Leg Stand 4.23 seconds           Nutrition Therapy Plan and Nutrition  Goals:  Nutrition Therapy & Goals - 06/28/24 1504       Intervention Plan   Intervention Prescribe, educate and counsel regarding individualized specific dietary modifications aiming towards targeted core components such as weight, hypertension, lipid management, diabetes, heart failure and other comorbidities.;Nutrition handout(s) given to patient.    Expected Outcomes Short Term Goal: Understand basic principles of dietary content, such as calories, fat, sodium, cholesterol and nutrients.;Short Term Goal: A plan has been developed with personal nutrition goals set during dietitian appointment.;Long Term Goal: Adherence to prescribed nutrition plan.  Nutrition Assessments:  Nutrition Assessments - 06/28/24 1505       Rate Your Plate Scores   Pre Score 59         MEDIFICTS Score Key: >=70 Need to make dietary changes  40-70 Heart Healthy Diet <= 40 Therapeutic Level Cholesterol Diet  Flowsheet Row CARDIAC REHAB PHASE II ORIENTATION from 06/28/2024 in [facility] CARDIAC RE[facility]IT[facility]  Picture Your Plate Total Score on Admission 59   Picture Your Plate Scores: <59 Unhealthy dietary pattern with much room for improvement. 41-50 Dietary pattern unlikely to meet recommendations for good health and room for improvement. 51-60 More healthful dietary pattern, with some room for improvement.  >60 Healthy dietary pattern, although there may be some specific behaviors that could be improved.    Nutrition Goals Re-Evaluation:   Nutrition Goals Discharge (Final Nutrition Goals Re-Evaluation):   Psychosocial: Target Goals: Acknowledge presence or absence of significant depression and/or stress, maximize coping skills, provide positive support system. Participant is able to verbalize types and ability to use techniques and skills needed for reducing stress and depression.   Education: Stress, Anxiety, and Depression - Group verbal and visual presentation to define topics  covered.  Reviews how body is impacted by stress, anxiety, and depression.  Also discusses healthy ways to reduce stress and to treat/manage anxiety and depression. Written material provided at class time.   Education: Sleep Hygiene -Provides group verbal and written instruction about how sleep can affect your health.  Define sleep hygiene, discuss sleep cycles and impact of sleep habits. Review good sleep hygiene tips.   Initial Review & Psychosocial Screening:  Initial Psych Review & Screening - 06/28/24 1501       Initial Review   Current issues with History of Depression;Current Psychotropic Meds      Family Dynamics   Good Support System? Yes    Comments The pt's husband and her sisters are her main support system.      Barriers   Psychosocial barriers to participate in program There are no identifiable barriers or psychosocial needs.      Screening Interventions   Interventions Encouraged to exercise    Expected Outcomes Long Term Goal: Stressors or current issues are controlled or eliminated.;Short Term goal: Identification and review with participant of any Quality of Life or Depression concerns found by scoring the questionnaire.;Long Term goal: The participant improves quality of Life and PHQ9 Scores as seen by post scores and/or verbalization of changes;Short Term goal: Utilizing psychosocial counselor, staff and physician to assist with identification of specific Stressors or current issues interfering with healing process. Setting desired goal for each stressor or current issue identified.          Quality of Life Scores:   Quality of Life - 06/28/24 1505       Quality of Life   Select Quality of Life      Quality of Life Scores   Health/Function Pre 25.03 %    Socioeconomic Pre 26.63 %    Psych/Spiritual Pre 29.14 %    Family Pre 27.6 %    GLOBAL Pre 25.59 %         Scores of 19 and below usually indicate a poorer quality of life in these areas.  A  difference of  2-3 points is a clinically meaningful difference.  A difference of 2-3 points in the total score of the Quality of Life Index has been associated with significant improvement in overall quality of life, self-image, physical symptoms, and  general health in studies assessing change in quality of life.  PHQ-9: Review Flowsheet  More data exists      06/28/2024 05/19/2023 10/10/2021 07/30/2021 04/18/2021  Depression screen PHQ 2/9  Decreased Interest 0 0 0 0 1  Down, Depressed, Hopeless 0 0 1 1 1   PHQ - 2 Score 0 0 1 1 2   Altered sleeping 2 3 - - -  Tired, decreased energy 2 2 - - -  Change in appetite 0 0 - - -  Feeling bad or failure about yourself  0 0 - - -  Trouble concentrating 0 0 - - -  Moving slowly or fidgety/restless 0 0 - - -  Suicidal thoughts 0 0 - - -  PHQ-9 Score 4 5  - - -  Difficult doing work/chores Not difficult at all Not difficult at all - - -    Details       Data saved with a previous flowsheet row definition        Interpretation of Total Score  Total Score Depression Severity:  1-4 = Minimal depression, 5-9 = Mild depression, 10-14 = Moderate depression, 15-19 = Moderately severe depression, 20-27 = Severe depression   Psychosocial Evaluation and Intervention:  Psychosocial Evaluation - 06/28/24 1501       Psychosocial Evaluation & Interventions   Interventions Encouraged to exercise with the program and follow exercise prescription    Comments Ada is a pleasant 68 year old female who is coming into rehab for NSTEMI and CABGX1. She did rehab last year but only completed 7 sessions due to finacial issues. She is more excited this time because she doesn't have a co-pay so she can not be stressed about that. She doesn't do much exercise at home now besides some walking. She has a good support system with her husband and sisters.    Expected Outcomes Short: Increase strength. Long: Help chest not to be so sore.    Continue Psychosocial Services   Follow up required by staff          Psychosocial Re-Evaluation:   Psychosocial Discharge (Final Psychosocial Re-Evaluation):   Vocational Rehabilitation: Provide vocational rehab assistance to qualifying candidates.   Vocational Rehab Evaluation & Intervention:   Education: Education Goals: Education classes will be provided on a variety of topics geared toward better understanding of heart health and risk factor modification. Participant will state understanding/return demonstration of topics presented as noted by education test scores.  Learning Barriers/Preferences:   General Cardiac Education Topics:  AED/CPR: - Group verbal and written instruction with the use of models to demonstrate the basic use of the AED with the basic ABC's of resuscitation.   Test and Procedures: - Group verbal and visual presentation and models provide information about basic cardiac anatomy and function. Reviews the testing methods done to diagnose heart disease and the outcomes of the test results. Describes the treatment choices: Medical Management, Angioplasty, or Coronary Bypass Surgery for treating various heart conditions including Myocardial Infarction, Angina, Valve Disease, and Cardiac Arrhythmias. Written material provided at class time.   Medication Safety: - Group verbal and visual instruction to review commonly prescribed medications for heart and lung disease. Reviews the medication, class of the drug, and side effects. Includes the steps to properly store meds and maintain the prescription regimen. Written material provided at class time.   Intimacy: - Group verbal instruction through game format to discuss how heart and lung disease can affect sexual intimacy. Written material provided at class  time.   Know Your Numbers and Heart Failure: - Group verbal and visual instruction to discuss disease risk factors for cardiac and pulmonary disease and treatment options.  Reviews  associated critical values for Overweight/Obesity, Hypertension, Cholesterol, and Diabetes.  Discusses basics of heart failure: signs/symptoms and treatments.  Introduces Heart Failure Zone chart for action plan for heart failure. Written material provided at class time.   Infection Prevention: - Provides verbal and written material to individual with discussion of infection control including proper hand washing and proper equipment cleaning during exercise session.   Falls Prevention: - Provides verbal and written material to individual with discussion of falls prevention and safety.   Other: -Provides group and verbal instruction on various topics (see comments)   Knowledge Questionnaire Score:  Knowledge Questionnaire Score - 06/28/24 1505       Knowledge Questionnaire Score   Pre Score 23/26          Core Components/Risk Factors/Patient Goals at Admission:  Personal Goals and Risk Factors at Admission - 06/28/24 1504       Core Components/Risk Factors/Patient Goals on Admission    Weight Management Yes;Weight Maintenance    Intervention Weight Management/Obesity: Establish reasonable short term and long term weight goals.;Obesity: Provide education and appropriate resources to help participant work on and attain dietary goals.    Expected Outcomes Short Term: Continue to assess and modify interventions until short term weight is achieved;Long Term: Adherence to nutrition and physical activity/exercise program aimed toward attainment of established weight goal;Weight Loss: Understanding of general recommendations for a balanced deficit meal plan, which promotes 1-2 lb weight loss per week and includes a negative energy balance of [phone] kcal/d;Understanding recommendations for meals to include 15-35% energy as protein, 25-35% energy from fat, 35-60% energy from carbohydrates, less than 200mg  of dietary cholesterol, 20-35 gm of total fiber daily    Improve shortness of breath  with ADL's Yes    Intervention Provide education, individualized exercise plan and daily activity instruction to help decrease symptoms of SOB with activities of daily living.    Expected Outcomes Short Term: Improve cardiorespiratory fitness to achieve a reduction of symptoms when performing ADLs;Long Term: Be able to perform more ADLs without symptoms or delay the onset of symptoms    Diabetes Yes    Intervention Provide education about signs/symptoms and action to take for hypo/hyperglycemia.;Provide education about proper nutrition, including hydration, and aerobic/resistive exercise prescription along with prescribed medications to achieve blood glucose in normal ranges: Fasting glucose 65-99 mg/dL    Expected Outcomes Short Term: Participant verbalizes understanding of the signs/symptoms and immediate care of hyper/hypoglycemia, proper foot care and importance of medication, aerobic/resistive exercise and nutrition plan for blood glucose control.;Long Term: Attainment of HbA1C < 7%.    Hypertension Yes    Intervention Provide education on lifestyle modifcations including regular physical activity/exercise, weight management, moderate sodium restriction and increased consumption of fresh fruit, vegetables, and low fat dairy, alcohol  moderation, and smoking cessation.;Monitor prescription use compliance.    Expected Outcomes Short Term: Continued assessment and intervention until BP is < 140/31mm HG in hypertensive participants. < 130/83mm HG in hypertensive participants with diabetes, heart failure or chronic kidney disease.;Long Term: Maintenance of blood pressure at goal levels.    Lipids Yes    Intervention Provide education and support for participant on nutrition & aerobic/resistive exercise along with prescribed medications to achieve LDL 70mg , HDL >40mg .    Expected Outcomes Short Term: Participant states understanding of desired cholesterol values and  is compliant with medications  prescribed. Participant is following exercise prescription and nutrition guidelines.;Long Term: Cholesterol controlled with medications as prescribed, with individualized exercise RX and with personalized nutrition plan. Value goals: LDL < 70mg , HDL > 40 mg.          Education:Diabetes - Individual verbal and written instruction to review signs/symptoms of diabetes, desired ranges of glucose level fasting, after meals and with exercise. Acknowledge that pre and post exercise glucose checks will be done for 3 sessions at entry of program.   Core Components/Risk Factors/Patient Goals Review:    Core Components/Risk Factors/Patient Goals at Discharge (Final Review):    ITP Comments:  ITP Comments     Row Name 06/28/24 1506           ITP Comments Patient attend orientation today.  Patient is attending Cardiac Rehabilitation Program.  Documentation for diagnosis can be found in [facility].  Reviewed medical chart, RPE/RPD, gym safety, and program guidelines.  Patient was fitted to equipment they will be using during rehab.  Patient is scheduled to start exercise on 07/06/24.   Initial ITP created and sent for review and signature by Dr. Dorn Ross, Medical Director for Cardiac Rehabilitation Program.          Comments: Initial ITP.

## 2024-06-28 NOTE — Patient Instructions (Signed)
 Patient Instructions  Patient Details  Name: Rachel Odonnell MRN: [id number] Date of Birth: [birth year] Referring Provider:  McDonell, Ronal Amble, MD  Below are your personal goals for exercise, nutrition, and risk factors. Our goal is to help you stay on track towards obtaining and maintaining these goals. We will be discussing your progress on these goals with you throughout the program.  Initial Exercise Prescription:  Initial Exercise Prescription - 06/28/24 1500       Date of Initial Exercise RX and Referring Provider   Date 06/28/24    Referring Provider Alvan Carrier MD      Oxygen   Maintain Oxygen Saturation 88% or higher      NuStep   Level 1    SPM 50    Minutes 15    METs 1.9      Arm Ergometer   Level 1    RPM 30    Minutes 15    METs 1.9      Prescription Details   Frequency (times per week) 2    Duration Progress to 30 minutes of continuous aerobic without signs/symptoms of physical distress      Intensity   THRR 40-80% of Max Heartrate 108-137    Ratings of Perceived Exertion 11-13    Perceived Dyspnea 0-4      Resistance Training   Training Prescription Yes    Weight 3    Reps 10-15          Exercise Goals: Frequency: Be able to perform aerobic exercise two to three times per week in program working toward 2-5 days per week of home exercise.  Intensity: Work with a perceived exertion of 11 (fairly light) - 15 (hard) while following your exercise prescription.  We will make changes to your prescription with you as you progress through the program.   Duration: Be able to do 30 to 45 minutes of continuous aerobic exercise in addition to a 5 minute warm-up and a 5 minute cool-down routine.   Nutrition Goals: Your personal nutrition goals will be established when you do your nutrition analysis with the dietician.  The following are general nutrition guidelines to follow: Cholesterol < 200mg /day Sodium < 1500mg /day Fiber: Women over 50 yrs - 21  grams per day  Personal Goals:  Personal Goals and Risk Factors at Admission - 06/28/24 1504       Core Components/Risk Factors/Patient Goals on Admission    Weight Management Yes;Weight Maintenance    Intervention Weight Management/Obesity: Establish reasonable short term and long term weight goals.;Obesity: Provide education and appropriate resources to help participant work on and attain dietary goals.    Expected Outcomes Short Term: Continue to assess and modify interventions until short term weight is achieved;Long Term: Adherence to nutrition and physical activity/exercise program aimed toward attainment of established weight goal;Weight Loss: Understanding of general recommendations for a balanced deficit meal plan, which promotes 1-2 lb weight loss per week and includes a negative energy balance of [phone] kcal/d;Understanding recommendations for meals to include 15-35% energy as protein, 25-35% energy from fat, 35-60% energy from carbohydrates, less than 200mg  of dietary cholesterol, 20-35 gm of total fiber daily    Improve shortness of breath with ADL's Yes    Intervention Provide education, individualized exercise plan and daily activity instruction to help decrease symptoms of SOB with activities of daily living.    Expected Outcomes Short Term: Improve cardiorespiratory fitness to achieve a reduction of symptoms when performing ADLs;Long Term: Be able to  perform more ADLs without symptoms or delay the onset of symptoms    Diabetes Yes    Intervention Provide education about signs/symptoms and action to take for hypo/hyperglycemia.;Provide education about proper nutrition, including hydration, and aerobic/resistive exercise prescription along with prescribed medications to achieve blood glucose in normal ranges: Fasting glucose 65-99 mg/dL    Expected Outcomes Short Term: Participant verbalizes understanding of the signs/symptoms and immediate care of hyper/hypoglycemia, proper foot care  and importance of medication, aerobic/resistive exercise and nutrition plan for blood glucose control.;Long Term: Attainment of HbA1C < 7%.    Hypertension Yes    Intervention Provide education on lifestyle modifcations including regular physical activity/exercise, weight management, moderate sodium restriction and increased consumption of fresh fruit, vegetables, and low fat dairy, alcohol  moderation, and smoking cessation.;Monitor prescription use compliance.    Expected Outcomes Short Term: Continued assessment and intervention until BP is < 140/54mm HG in hypertensive participants. < 130/78mm HG in hypertensive participants with diabetes, heart failure or chronic kidney disease.;Long Term: Maintenance of blood pressure at goal levels.    Lipids Yes    Intervention Provide education and support for participant on nutrition & aerobic/resistive exercise along with prescribed medications to achieve LDL 70mg , HDL >40mg .    Expected Outcomes Short Term: Participant states understanding of desired cholesterol values and is compliant with medications prescribed. Participant is following exercise prescription and nutrition guidelines.;Long Term: Cholesterol controlled with medications as prescribed, with individualized exercise RX and with personalized nutrition plan. Value goals: LDL < 70mg , HDL > 40 mg.          Tobacco Use Initial Evaluation: Social History   Tobacco Use  Smoking Status Former   Current packs/day: 0.00   Types: Cigarettes   Quit date: 08/17/1994   Years since quitting: 29.8  Smokeless Tobacco Never    Exercise Goals and Review:  Exercise Goals     Row Name 06/28/24 1500             Exercise Goals   Increase Physical Activity Yes       Intervention Provide advice, education, support and counseling about physical activity/exercise needs.;Develop an individualized exercise prescription for aerobic and resistive training based on initial evaluation findings, risk  stratification, comorbidities and participant's personal goals.       Expected Outcomes Short Term: Attend rehab on a regular basis to increase amount of physical activity.;Long Term: Add in home exercise to make exercise part of routine and to increase amount of physical activity.;Long Term: Exercising regularly at least 3-5 days a week.       Increase Strength and Stamina Yes       Intervention Provide advice, education, support and counseling about physical activity/exercise needs.;Develop an individualized exercise prescription for aerobic and resistive training based on initial evaluation findings, risk stratification, comorbidities and participant's personal goals.       Expected Outcomes Short Term: Increase workloads from initial exercise prescription for resistance, speed, and METs.;Long Term: Improve cardiorespiratory fitness, muscular endurance and strength as measured by increased METs and functional capacity ( );Short Term: Perform resistance training exercises routinely during rehab and add in resistance training at home       Able to understand and use rate of perceived exertion (RPE) scale Yes       Intervention Provide education and explanation on how to use RPE scale       Expected Outcomes Short Term: Able to use RPE daily in rehab to express subjective intensity level;Long Term:  Able to use  RPE to guide intensity level when exercising independently       Able to understand and use Dyspnea scale Yes       Intervention Provide education and explanation on how to use Dyspnea scale       Expected Outcomes Short Term: Able to use Dyspnea scale daily in rehab to express subjective sense of shortness of breath during exertion;Long Term: Able to use Dyspnea scale to guide intensity level when exercising independently       Knowledge and understanding of Target Heart Rate Range (THRR) Yes       Intervention Provide education and explanation of THRR including how the numbers were predicted  and where they are located for reference       Expected Outcomes Short Term: Able to state/look up THRR;Long Term: Able to use THRR to govern intensity when exercising independently;Short Term: Able to use daily as guideline for intensity in rehab       Able to check pulse independently Yes       Intervention Review the importance of being able to check your own pulse for safety during independent exercise;Provide education and demonstration on how to check pulse in carotid and radial arteries.       Expected Outcomes Short Term: Able to explain why pulse checking is important during independent exercise;Long Term: Able to check pulse independently and accurately       Understanding of Exercise Prescription Yes       Intervention Provide education, explanation, and written materials on patient's individual exercise prescription       Expected Outcomes Short Term: Able to explain program exercise prescription;Long Term: Able to explain home exercise prescription to exercise independently          Copy of goals given to participant.

## 2024-07-02 ENCOUNTER — Other Ambulatory Visit: Payer: Self-pay | Admitting: Cardiology

## 2024-07-02 DIAGNOSIS — I4891 Unspecified atrial fibrillation: Secondary | ICD-10-CM

## 2024-07-03 ENCOUNTER — Ambulatory Visit: Attending: Cardiology | Admitting: *Deleted

## 2024-07-03 DIAGNOSIS — I4821 Permanent atrial fibrillation: Secondary | ICD-10-CM

## 2024-07-03 DIAGNOSIS — Z5181 Encounter for therapeutic drug level monitoring: Secondary | ICD-10-CM

## 2024-07-03 LAB — POCT INR: INR: 3.1 — AB (ref 2.0–3.0)

## 2024-07-03 NOTE — Patient Instructions (Signed)
 Decrease warfarin to 1/2 tablet daily  Recheck INR in 1 week

## 2024-07-03 NOTE — Telephone Encounter (Addendum)
 Refill request for warfarin:  Last INR was 4.4 on 06/21/24 Next INR due 07/03/24 LOV was 09/06/23   Refill approved.

## 2024-07-03 NOTE — Progress Notes (Signed)
 INR 3.1  Please see anticoagulation encounter

## 2024-07-05 ENCOUNTER — Telehealth: Payer: Self-pay | Admitting: Internal Medicine

## 2024-07-05 NOTE — Telephone Encounter (Signed)
   Pre-operative Risk Assessment    Patient Name: Rachel Odonnell  DOB: April 30, 1956 MRN: [id number]   Date of last office visit: 09/06/23 Date of next office visit: 08/11/24   Request for Surgical Clearance    Procedure:  Cleaning  Date of Surgery:  Clearance 09/04/24                                Surgeon:  Dr. Venetia Gelineau Surgeon's Group or Practice Name:  Dr. Venetia Gelineau, Dentist Phone number:  [phone]  Fax number:  [phone]   Type of Clearance Requested:   - Medical    Type of Anesthesia:     Additional requests/questions:  Caller Helen) stated patient had open heart surgery in September and wants to know when patient can have dental cleaning and if patient will need pre-medication.  Signed, Jasmin B Wilson   07/05/2024, 3:49 PM

## 2024-07-05 NOTE — Telephone Encounter (Signed)
   Patient Name: Rachel Odonnell  DOB: 1956-01-17 MRN: [id number]  Primary Cardiologist: Jayson Sierras, MD  Chart reviewed as part of pre-operative protocol coverage.   Simple dental extractions (i.e. 1-2 teeth) are considered low risk procedures per guidelines and generally do not require any specific cardiac clearance. It is also generally accepted that for simple extractions and dental cleanings, there is no need to interrupt blood thinner therapy.  Has follow up appointment on 08/11/2024 with cardiology. I will leave note for them to address as well.   SBE prophylaxis is not required for the patient from a cardiac standpoint.  I will route this recommendation to the requesting party via Epic fax function and remove from pre-op pool.  Please call with questions.  Lamarr Satterfield, NP 07/05/2024, 4:26 PM

## 2024-07-06 ENCOUNTER — Encounter (HOSPITAL_COMMUNITY)

## 2024-07-10 ENCOUNTER — Ambulatory Visit: Attending: Cardiology | Admitting: *Deleted

## 2024-07-10 ENCOUNTER — Other Ambulatory Visit: Payer: Self-pay | Admitting: Nurse Practitioner

## 2024-07-10 DIAGNOSIS — Z5181 Encounter for therapeutic drug level monitoring: Secondary | ICD-10-CM

## 2024-07-10 DIAGNOSIS — I4821 Permanent atrial fibrillation: Secondary | ICD-10-CM | POA: Diagnosis not present

## 2024-07-10 LAB — POCT INR: INR: 2.1 (ref 2.0–3.0)

## 2024-07-10 NOTE — Progress Notes (Signed)
 INR 2.1; Please see anticoagulation encounter

## 2024-07-10 NOTE — Patient Instructions (Signed)
 Continue warfarin 1/2 tablet daily  Recheck INR in 2 week

## 2024-07-11 ENCOUNTER — Encounter (HOSPITAL_COMMUNITY)
Admission: RE | Admit: 2024-07-11 | Discharge: 2024-07-11 | Disposition: A | Discharge status: Home / Self Care | Source: Ambulatory Visit | Attending: Cardiology | Admitting: Cardiology

## 2024-07-11 DIAGNOSIS — Z955 Presence of coronary angioplasty implant and graft: Secondary | ICD-10-CM

## 2024-07-11 DIAGNOSIS — Z951 Presence of aortocoronary bypass graft: Secondary | ICD-10-CM

## 2024-07-11 DIAGNOSIS — I214 Non-ST elevation (NSTEMI) myocardial infarction: Secondary | ICD-10-CM

## 2024-07-11 LAB — GLUCOSE, CAPILLARY
Glucose-Capillary: 141 mg/dL — ABNORMAL HIGH (ref 70–99)
Glucose-Capillary: 143 mg/dL — ABNORMAL HIGH (ref 70–99)

## 2024-07-11 NOTE — Progress Notes (Signed)
 Daily Session Note  Patient Details  Name: Rachel Odonnell MRN: [id number] Date of Birth: 1956-03-04 Referring Provider:   Flowsheet Row CARDIAC REHAB PHASE II ORIENTATION from 06/28/2024 in [facility]  Referring Provider Alvan Carrier MD    Encounter Date: 07/11/2024  Check In:  Session Check In - 07/11/24 1337       Check-In   Supervising physician immediately available to respond to emergencies See telemetry face sheet for immediately available MD    Location [facility]    Staff Present Powell Benders, BS, Exercise Physiologist;Brittany Jackquline, BSN, RN, WTA-C;Albie Bazin Zina, RN    Virtual Visit No    Medication changes reported     No    Fall or balance concerns reported    No    Warm-up and Cool-down Performed on first and last piece of equipment    Resistance Training Performed Yes    VAD Patient? No    PAD/SET Patient? No      Pain Assessment   Currently in Pain? No/denies          Capillary Blood Glucose: Results for orders placed or performed during the hospital encounter of 07/11/24 (from the past 24 hours)  Glucose, capillary     Status: Abnormal   Collection Time: 07/11/24  1:29 PM  Result Value Ref Range   Glucose-Capillary 141 (H) 70 - 99 mg/dL      Social History   Tobacco Use  Smoking Status Former   Current packs/day: 0.00   Types: Cigarettes   Quit date: 08/17/1994   Years since quitting: 29.9  Smokeless Tobacco Never    Goals Met:  Independence with exercise equipment Exercise tolerated well No report of concerns or symptoms today Strength training completed today  Goals Unmet:  Not Applicable  Comments: First full day of exercise!  Patient was oriented to gym and equipment including functions, settings, policies, and procedures.  Patient's individual exercise prescription and treatment plan were reviewed.  All starting workloads were established based on the results of the 6 minute walk test  done at initial orientation visit.  The plan for exercise progression was also introduced and progression will be customized based on patient's performance and goals.

## 2024-07-12 ENCOUNTER — Other Ambulatory Visit: Payer: Self-pay | Admitting: Nurse Practitioner

## 2024-07-18 ENCOUNTER — Encounter (HOSPITAL_COMMUNITY)
Admission: RE | Admit: 2024-07-18 | Discharge: 2024-07-18 | Disposition: A | Discharge status: Home / Self Care | Source: Ambulatory Visit | Attending: Cardiology | Admitting: Cardiology

## 2024-07-18 ENCOUNTER — Encounter (HOSPITAL_COMMUNITY): Payer: Self-pay | Admitting: *Deleted

## 2024-07-18 DIAGNOSIS — I214 Non-ST elevation (NSTEMI) myocardial infarction: Secondary | ICD-10-CM | POA: Insufficient documentation

## 2024-07-18 DIAGNOSIS — Z951 Presence of aortocoronary bypass graft: Secondary | ICD-10-CM

## 2024-07-18 LAB — PLATELET INHIBITION P2Y12

## 2024-07-18 NOTE — Progress Notes (Signed)
 Cardiac Individual Treatment Plan  Patient Details  Name: Rachel Odonnell MRN: [id number] Date of Birth: [birth year] Referring Provider:   Flowsheet Row CARDIAC REHAB PHASE II ORIENTATION from 06/28/2024 in [facility] CARDIAC REHABILITATION  Referring Provider Alvan Carrier MD    Initial Encounter Date:  Flowsheet Row CARDIAC REHAB PHASE II ORIENTATION from 06/28/2024 in [facility] [place] CARDIAC REH[facility]ILITATION  Date 06/28/24    Visit Diagnosis: NSTEMI (non-ST elevated myocardial infarction) (HCC)  S/P CABG x 1  Patient's Home Medications on Admission:  Current Outpatient Medications:    Accu-Chek Softclix Lancets lancets, Use as instructed to test blood glucose four times daily, Disp: 100 each, Rfl: 2   acetaminophen  (TYLENOL ) 325 MG tablet, Take 2 tablets (650 mg total) by mouth every 6 (six) hours as needed for headache or mild pain (pain score 1-3)., Disp: , Rfl:    aspirin  EC 325 MG tablet, Take 1 tablet (325 mg total) by mouth daily., Disp: , Rfl:    busPIRone  (BUSPAR ) 5 MG tablet, Take 5 mg by mouth at bedtime., Disp: , Rfl:    Cholecalciferol  (VITAMIN D3) 125 MCG (5000 UT) CAPS, Take 1 capsule (5,000 Units total) by mouth daily., Disp: 90 capsule, Rfl: 0   citalopram  (CELEXA ) 20 MG tablet, Take 1 tablet (20 mg total) by mouth every evening., Disp: 30 tablet, Rfl: 4   Continuous Glucose Receiver (FREESTYLE LIBRE 2 READER) DEVI, USE TO CHECK GLUCOSE AS DIRECTED, Disp: 1 each, Rfl: 0   Continuous Glucose Sensor (FREESTYLE LIBRE 2 SENSOR) MISC, CHANGE SENSOR EVERY 14 DAYS USE  TO CHECK BLOOD GLUCOSE  CONTINUOUSLY, Disp: 6 each, Rfl: 3   donepezil  (ARICEPT ) 5 MG tablet, Take 5 mg by mouth every evening., Disp: , Rfl:    empagliflozin  (JARDIANCE ) 25 MG TABS tablet, Take 1 tablet (25 mg total) by mouth daily., Disp: 90 tablet, Rfl: 1   estradiol  (ESTRACE ) 1 MG tablet, Take 1 mg by mouth daily., Disp: , Rfl:    fenofibrate  160 MG tablet, Take 1 tablet (160 mg total) by mouth daily., Disp:  90 tablet, Rfl: 1   gabapentin  (NEURONTIN ) 100 MG capsule, Take 1 capsule (100 mg total) by mouth at bedtime., Disp: , Rfl:    glucose blood test strip, 1 each by Other route as needed. Use as instructed to test blood glucose four times daily, Disp: 100 each, Rfl: 2   HUMALOG  KWIKPEN 100 UNIT/ML KwikPen, INJECT SUBCUTANEOUSLY 16 TO 22  UNITS INTO THE SKIN 3 TIMES  DAILY BEFORE MEALS, Disp: 60 mL, Rfl: 2   insulin  degludec (TRESIBA  FLEXTOUCH) 200 UNIT/ML FlexTouch Pen, Inject 110 Units into the skin at bedtime., Disp: 24 mL, Rfl: 2   Insulin  Pen Needle (B-D ULTRAFINE III SHORT PEN) 31G X 8 MM MISC, 1 each by Does not apply route as directed., Disp: 100 each, Rfl: 2   levocetirizine (XYZAL ) 5 MG tablet, Take 5 mg by mouth every evening., Disp: , Rfl:    levothyroxine  (SYNTHROID ) 50 MCG tablet, TAKE 1 TABLET BY MOUTH DAILY  BEFORE BREAKFAST, Disp: 90 tablet, Rfl: 3   Magnesium  Oxide (MAGNESIUM  OXIDE 400) 240 MG PACK, Take 400 mg by mouth daily., Disp: , Rfl:    metFORMIN  (GLUCOPHAGE ) 500 MG tablet, TAKE 1 TABLET BY MOUTH DAILY  WITH BREAKFAST, Disp: 90 tablet, Rfl: 3   modafinil  (PROVIGIL ) 100 MG tablet, Take 1 tablet (100 mg total) by mouth daily., Disp: 90 tablet, Rfl: 0   montelukast  (SINGULAIR ) 10 MG tablet, Take 10 mg by mouth  at bedtime., Disp: , Rfl:    Multiple Vitamin (MULTIVITAMIN ADULT PO), Take 1 tablet by mouth daily., Disp: , Rfl:    OVER THE COUNTER MEDICATION, Take 2 capsules by mouth daily. Omega XL, Disp: , Rfl:    pantoprazole  (PROTONIX ) 40 MG tablet, TAKE 2 TABLETS EVERY DAY, Disp: 180 tablet, Rfl: 1   potassium chloride  SA (KLOR-CON  M) 20 MEQ tablet, Take 1 tablet (20 mEq total) by mouth daily., Disp: 5 tablet, Rfl: 0   rOPINIRole  (REQUIP ) 2 MG tablet, Take 2 mg by mouth at bedtime., Disp: , Rfl:    rosuvastatin  (CRESTOR ) 40 MG tablet, Take 1 tablet (40 mg total) by mouth daily., Disp: 30 tablet, Rfl: 5   Semaglutide ,0.25 or 0.5MG /DOS, (OZEMPIC , 0.25 OR 0.5 MG/DOSE,) 2 MG/1.5ML  SOPN, Inject 0.5 mg into the skin once a week., Disp: , Rfl:    Vibegron  (GEMTESA ) 75 MG TABS, Take 1 tablet (75 mg total) by mouth daily., Disp: 30 tablet, Rfl: 11   warfarin (COUMADIN ) 5 MG tablet, TAKE ONE-HALF TABLET TO 1 TABLET BY MOUTH  DAILY OR AS DIRECTED, Disp: 100 tablet, Rfl: 1  Past Medical History: Past Medical History:  Diagnosis Date   ADD (attention deficit disorder)    Diabetes mellitus without complication (HCC)    Hypertension     Tobacco Use: Social History   Tobacco Use  Smoking Status Former   Current packs/day: 0.00   Types: Cigarettes   Quit date: 08/17/1994   Years since quitting: 29.9  Smokeless Tobacco Never    Labs: Review Flowsheet  More data exists      Latest Ref Rng & Units 09/17/2023 05/03/2024 05/05/2024 05/06/2024 05/07/2024  Labs for ITP Cardiac and Pulmonary Rehab  Cholestrol 0 - 200 mg/dL - 850  - - -  LDL (calc) 0 - 99 mg/dL - 75  - - -  HDL-C >59 mg/dL - 34  - - -  Trlycerides <150 mg/dL - 798  - - -  Hemoglobin A1c 4.8 - 5.6 % - - 9.2  - -  PH, Arterial 7.35 - 7.45 - - - 7.329  7.281  7.240  7.295  7.255  7.387  7.347   PCO2 arterial 32 - 48 mmHg - - - 49.0  51.2  54.2  45.8  51.9  38.9  47.3   Bicarbonate 20.0 - 28.0 mmol/L 27.9  - - 25.7  23.7  22.8  22.2  23.0  23.6  25.9   TCO2 22 - 32 mmol/L - - - 27  25  24  24  25  25  25  26  27  27    Acid-base deficit 0.0 - 2.0 mmol/L - - - 1.0  3.0  5.0  4.0  4.0  1.0  -  O2 Saturation % 39.9  - - 98  97  92  92  90  97  97     Details       Multiple values from one day are sorted in reverse-chronological order         Capillary Blood Glucose: Lab Results  Component Value Date   GLUCAP 143 (H) 07/11/2024   GLUCAP 141 (H) 07/11/2024   GLUCAP 199 (H) 05/13/2024   GLUCAP 145 (H) 05/13/2024   GLUCAP 127 (H) 05/12/2024     Exercise Target Goals: Exercise Program Goal: Individual exercise prescription set using results from initial 6 min walk test and THRR while considering   patient's activity barriers and safety.  Exercise Prescription Goal: Starting with aerobic activity 30 plus minutes a day, 3 days per week for initial exercise prescription. Provide home exercise prescription and guidelines that participant acknowledges understanding prior to discharge.  Activity Barriers & Risk Stratification:  Activity Barriers & Cardiac Risk Stratification - 06/28/24 1419       Activity Barriers & Cardiac Risk Stratification   Activity Barriers Arthritis;Back Problems;Deconditioning;Muscular Weakness;Incisional Pain;Chest Pain/Angina    Cardiac Risk Stratification High          6 Minute Walk:  6 Minute Walk     Row Name 06/28/24 1519         6 Minute Walk   Phase Initial     Distance 800 feet     Walk Time 6 minutes     # of Rest Breaks 1     MPH 1.52     METS 1.42     RPE 12     VO2 Peak 4.97     Symptoms Yes (comment)     Comments BL hip pain 4/10     Resting HR 79 bpm     Resting BP 122/70     Resting Oxygen Saturation  97 %     Exercise Oxygen Saturation  during 6 min walk 97 %     Max Ex. HR 85 bpm     Max Ex. BP 134/60     2 Minute Post BP 120/68        Oxygen Initial Assessment:   Oxygen Re-Evaluation:   Oxygen Discharge (Final Oxygen Re-Evaluation):   Initial Exercise Prescription:  Initial Exercise Prescription - 06/28/24 1500       Date of Initial Exercise RX and Referring Provider   Date 06/28/24    Referring Provider Alvan Carrier MD      Oxygen   Maintain Oxygen Saturation 88% or higher      NuStep   Level 1    SPM 50    Minutes 15    METs 1.9      Arm Ergometer   Level 1    RPM 30    Minutes 15    METs 1.9      Prescription Details   Frequency (times per week) 2    Duration Progress to 30 minutes of continuous aerobic without signs/symptoms of physical distress      Intensity   THRR 40-80% of Max Heartrate 108-137    Ratings of Perceived Exertion 11-13    Perceived Dyspnea 0-4      Resistance  Training   Training Prescription Yes    Weight 3    Reps 10-15          Perform Capillary Blood Glucose checks as needed.  Exercise Prescription Changes:   Exercise Comments:   Exercise Comments     Row Name 06/28/24 1500 06/28/24 1506 07/11/24 1339       Exercise Comments Nathanel doesn't do much exercise at home besides a little walking. Patient attend orientation today.  Patient is attending Cardiac Rehabilitation Program.  Documentation for diagnosis can be found in [facility].  Reviewed medical chart, RPE/RPD, gym safety, and program guidelines.  Patient was fitted to equipment they will be using during rehab.  Patient is scheduled to start exercise on 07/06/24.   Initial ITP created and sent for review and signature by Dr. Carrier Alvan, Medical Director for Cardiac Rehabilitation Program. First full day of exercise!  Patient was oriented to gym and equipment including functions, settings, policies, and  procedures.  Patient's individual exercise prescription and treatment plan were reviewed.  All starting workloads were established based on the results of the 6 minute walk test done at initial orientation visit.  The plan for exercise progression was also introduced and progression will be customized based on patient's performance and goals.        Exercise Goals and Review:   Exercise Goals     Row Name 06/28/24 1500             Exercise Goals   Increase Physical Activity Yes       Intervention Provide advice, education, support and counseling about physical activity/exercise needs.;Develop an individualized exercise prescription for aerobic and resistive training based on initial evaluation findings, risk stratification, comorbidities and participant's personal goals.       Expected Outcomes Short Term: Attend rehab on a regular basis to increase amount of physical activity.;Long Term: Add in home exercise to make exercise part of routine and to increase amount of physical  activity.;Long Term: Exercising regularly at least 3-5 days a week.       Increase Strength and Stamina Yes       Intervention Provide advice, education, support and counseling about physical activity/exercise needs.;Develop an individualized exercise prescription for aerobic and resistive training based on initial evaluation findings, risk stratification, comorbidities and participant's personal goals.       Expected Outcomes Short Term: Increase workloads from initial exercise prescription for resistance, speed, and METs.;Long Term: Improve cardiorespiratory fitness, muscular endurance and strength as measured by increased METs and functional capacity ( );Short Term: Perform resistance training exercises routinely during rehab and add in resistance training at home       Able to understand and use rate of perceived exertion (RPE) scale Yes       Intervention Provide education and explanation on how to use RPE scale       Expected Outcomes Short Term: Able to use RPE daily in rehab to express subjective intensity level;Long Term:  Able to use RPE to guide intensity level when exercising independently       Able to understand and use Dyspnea scale Yes       Intervention Provide education and explanation on how to use Dyspnea scale       Expected Outcomes Short Term: Able to use Dyspnea scale daily in rehab to express subjective sense of shortness of breath during exertion;Long Term: Able to use Dyspnea scale to guide intensity level when exercising independently       Knowledge and understanding of Target Heart Rate Range (THRR) Yes       Intervention Provide education and explanation of THRR including how the numbers were predicted and where they are located for reference       Expected Outcomes Short Term: Able to state/look up THRR;Long Term: Able to use THRR to govern intensity when exercising independently;Short Term: Able to use daily as guideline for intensity in rehab       Able to check  pulse independently Yes       Intervention Review the importance of being able to check your own pulse for safety during independent exercise;Provide education and demonstration on how to check pulse in carotid and radial arteries.       Expected Outcomes Short Term: Able to explain why pulse checking is important during independent exercise;Long Term: Able to check pulse independently and accurately       Understanding of Exercise Prescription Yes  Intervention Provide education, explanation, and written materials on patient's individual exercise prescription       Expected Outcomes Short Term: Able to explain program exercise prescription;Long Term: Able to explain home exercise prescription to exercise independently          Exercise Goals Re-Evaluation :    Discharge Exercise Prescription (Final Exercise Prescription Changes):   Nutrition:  Target Goals: Understanding of nutrition guidelines, daily intake of sodium 1500mg , cholesterol 200mg , calories 30% from fat and 7% or less from saturated fats, daily to have 5 or more servings of fruits and vegetables.  Biometrics:  Pre Biometrics - 06/28/24 1522       Pre Biometrics   Height 5' 7 (1.702 m)    Weight 213 lb 3 oz (96.7 kg)    Waist Circumference 41 inches    Hip Circumference 47 inches    Waist to Hip Ratio 0.87 %    BMI (Calculated) 33.38    Grip Strength 25.3 kg    Single Leg Stand 4.23 seconds           Nutrition Therapy Plan and Nutrition Goals:  Nutrition Therapy & Goals - 06/28/24 1504       Intervention Plan   Intervention Prescribe, educate and counsel regarding individualized specific dietary modifications aiming towards targeted core components such as weight, hypertension, lipid management, diabetes, heart failure and other comorbidities.;Nutrition handout(s) given to patient.    Expected Outcomes Short Term Goal: Understand basic principles of dietary content, such as calories, fat, sodium,  cholesterol and nutrients.;Short Term Goal: A plan has been developed with personal nutrition goals set during dietitian appointment.;Long Term Goal: Adherence to prescribed nutrition plan.          Nutrition Assessments:  Nutrition Assessments - 06/28/24 1505       Rate Your Plate Scores   Pre Score 59         MEDIFICTS Score Key: >=70 Need to make dietary changes  40-70 Heart Healthy Diet <= 40 Therapeutic Level Cholesterol Diet  Flowsheet Row CARDIAC REHAB PHASE II ORIENTATION from 06/28/2024 in [facility] CARD[facility]AC [facility]ITATION  Picture Your Plate Total Score on Admission 59   Picture Your Plate Scores: <59 Unhealthy dietary pattern with much room for improvement. 41-50 Dietary pattern unlikely to meet recommendations for good health and room for improvement. 51-60 More healthful dietary pattern, with some room for improvement.  >60 Healthy dietary pattern, although there may be some specific behaviors that could be improved.    Nutrition Goals Re-Evaluation:   Nutrition Goals Discharge (Final Nutrition Goals Re-Evaluation):   Psychosocial: Target Goals: Acknowledge presence or absence of significant depression and/or stress, maximize coping skills, provide positive support system. Participant is able to verbalize types and ability to use techniques and skills needed for reducing stress and depression.  Initial Review & Psychosocial Screening:  Initial Psych Review & Screening - 06/28/24 1501       Initial Review   Current issues with History of Depression;Current Psychotropic Meds      Family Dynamics   Good Support System? Yes    Comments The pt's husband and her sisters are her main support system.      Barriers   Psychosocial barriers to participate in program There are no identifiable barriers or psychosocial needs.      Screening Interventions   Interventions Encouraged to exercise    Expected Outcomes Long Term Goal: Stressors or current issues are  controlled or eliminated.;Short Term goal: Identification and review with  participant of any Quality of Life or Depression concerns found by scoring the questionnaire.;Long Term goal: The participant improves quality of Life and PHQ9 Scores as seen by post scores and/or verbalization of changes;Short Term goal: Utilizing psychosocial counselor, staff and physician to assist with identification of specific Stressors or current issues interfering with healing process. Setting desired goal for each stressor or current issue identified.          Quality of Life Scores:  Quality of Life - 06/28/24 1505       Quality of Life   Select Quality of Life      Quality of Life Scores   Health/Function Pre 25.03 %    Socioeconomic Pre 26.63 %    Psych/Spiritual Pre 29.14 %    Family Pre 27.6 %    GLOBAL Pre 25.59 %         Scores of 19 and below usually indicate a poorer quality of life in these areas.  A difference of  2-3 points is a clinically meaningful difference.  A difference of 2-3 points in the total score of the Quality of Life Index has been associated with significant improvement in overall quality of life, self-image, physical symptoms, and general health in studies assessing change in quality of life.  PHQ-9: Review Flowsheet  More data exists      06/28/2024 05/19/2023 10/10/2021 07/30/2021 04/18/2021  Depression screen PHQ 2/9  Decreased Interest 0 0 0 0 1  Down, Depressed, Hopeless 0 0 1 1 1   PHQ - 2 Score 0 0 1 1 2   Altered sleeping 2 3 - - -  Tired, decreased energy 2 2 - - -  Change in appetite 0 0 - - -  Feeling bad or failure about yourself  0 0 - - -  Trouble concentrating 0 0 - - -  Moving slowly or fidgety/restless 0 0 - - -  Suicidal thoughts 0 0 - - -  PHQ-9 Score 4 5  - - -  Difficult doing work/chores Not difficult at all Not difficult at all - - -    Details       Data saved with a previous flowsheet row definition        Interpretation of Total Score   Total Score Depression Severity:  1-4 = Minimal depression, 5-9 = Mild depression, 10-14 = Moderate depression, 15-19 = Moderately severe depression, 20-27 = Severe depression   Psychosocial Evaluation and Intervention:  Psychosocial Evaluation - 06/28/24 1501       Psychosocial Evaluation & Interventions   Interventions Encouraged to exercise with the program and follow exercise prescription    Comments Rachel Odonnell is a pleasant 68 year old female who is coming into rehab for NSTEMI and CABGX1. She did rehab last year but only completed 7 sessions due to finacial issues. She is more excited this time because she doesn't have a co-pay so she can not be stressed about that. She doesn't do much exercise at home now besides some walking. She has a good support system with her husband and sisters.    Expected Outcomes Short: Increase strength. Long: Help chest not to be so sore.    Continue Psychosocial Services  Follow up required by staff          Psychosocial Re-Evaluation:   Psychosocial Discharge (Final Psychosocial Re-Evaluation):   Vocational Rehabilitation: Provide vocational rehab assistance to qualifying candidates.   Vocational Rehab Evaluation & Intervention:   Education: Education Goals: Education classes will  be provided on a weekly basis, covering required topics. Participant will state understanding/return demonstration of topics presented.  Learning Barriers/Preferences:   Education Topics: Hypertension, Hypertension Reduction -Define heart disease and high blood pressure. Discus how high blood pressure affects the body and ways to reduce high blood pressure.   Exercise and Your Heart -Discuss why it is important to exercise, the FITT principles of exercise, normal and abnormal responses to exercise, and how to exercise safely.   Angina -Discuss definition of angina, causes of angina, treatment of angina, and how to decrease risk of having angina.   Cardiac  Medications -Review what the following cardiac medications are used for, how they affect the body, and side effects that may occur when taking the medications.  Medications include Aspirin , Beta blockers, calcium  channel blockers, ACE Inhibitors, angiotensin receptor blockers, diuretics, digoxin, and antihyperlipidemics.   Congestive Heart Failure -Discuss the definition of CHF, how to live with CHF, the signs and symptoms of CHF, and how keep track of weight and sodium intake.   Heart Disease and Intimacy -Discus the effect sexual activity has on the heart, how changes occur during intimacy as we age, and safety during sexual activity.   Smoking Cessation / COPD -Discuss different methods to quit smoking, the health benefits of quitting smoking, and the definition of COPD.   Nutrition I: Fats -Discuss the types of cholesterol, what cholesterol does to the heart, and how cholesterol levels can be controlled. Flowsheet Row CARDIAC REHAB PHASE II EXERCISE from 06/09/2023 in [place] CARDIAC REH[facility]ILITATION  Date 06/02/23  Educator hb  Instruction Review Code 1- Verbalizes Understanding    Nutrition II: Labels -Discuss the different components of food labels and how to read food label   Heart Parts/Heart Disease and PAD -Discuss the anatomy of the heart, the pathway of blood circulation through the heart, and these are affected by heart disease.   Stress I: Signs and Symptoms -Discuss the causes of stress, how stress may lead to anxiety and depression, and ways to limit stress. Flowsheet Row CARDIAC REHAB PHASE II EXERCISE from 06/09/2023 in [place] CARDIAC REH[facility]ILITATION  Date 06/09/23  Educator HB  Instruction Review Code 1- Verbalizes Understanding    Stress II: Relaxation -Discuss different types of relaxation techniques to limit stress.   Warning Signs of Stroke / TIA -Discuss definition of a stroke, what the signs and symptoms are of a stroke, and how to identify  when someone is having stroke.   Knowledge Questionnaire Score:  Knowledge Questionnaire Score - 06/28/24 1505       Knowledge Questionnaire Score   Pre Score 23/26          Core Components/Risk Factors/Patient Goals at Admission:  Personal Goals and Risk Factors at Admission - 06/28/24 1504       Core Components/Risk Factors/Patient Goals on Admission    Weight Management Yes;Weight Maintenance    Intervention Weight Management/Obesity: Establish reasonable short term and long term weight goals.;Obesity: Provide education and appropriate resources to help participant work on and attain dietary goals.    Expected Outcomes Short Term: Continue to assess and modify interventions until short term weight is achieved;Long Term: Adherence to nutrition and physical activity/exercise program aimed toward attainment of established weight goal;Weight Loss: Understanding of general recommendations for a balanced deficit meal plan, which promotes 1-2 lb weight loss per week and includes a negative energy balance of [phone] kcal/d;Understanding recommendations for meals to include 15-35% energy as protein, 25-35% energy from fat, 35-60% energy  from carbohydrates, less than 200mg  of dietary cholesterol, 20-35 gm of total fiber daily    Improve shortness of breath with ADL's Yes    Intervention Provide education, individualized exercise plan and daily activity instruction to help decrease symptoms of SOB with activities of daily living.    Expected Outcomes Short Term: Improve cardiorespiratory fitness to achieve a reduction of symptoms when performing ADLs;Long Term: Be able to perform more ADLs without symptoms or delay the onset of symptoms    Diabetes Yes    Intervention Provide education about signs/symptoms and action to take for hypo/hyperglycemia.;Provide education about proper nutrition, including hydration, and aerobic/resistive exercise prescription along with prescribed medications to achieve  blood glucose in normal ranges: Fasting glucose 65-99 mg/dL    Expected Outcomes Short Term: Participant verbalizes understanding of the signs/symptoms and immediate care of hyper/hypoglycemia, proper foot care and importance of medication, aerobic/resistive exercise and nutrition plan for blood glucose control.;Long Term: Attainment of HbA1C < 7%.    Hypertension Yes    Intervention Provide education on lifestyle modifcations including regular physical activity/exercise, weight management, moderate sodium restriction and increased consumption of fresh fruit, vegetables, and low fat dairy, alcohol  moderation, and smoking cessation.;Monitor prescription use compliance.    Expected Outcomes Short Term: Continued assessment and intervention until BP is < 140/44mm HG in hypertensive participants. < 130/60mm HG in hypertensive participants with diabetes, heart failure or chronic kidney disease.;Long Term: Maintenance of blood pressure at goal levels.    Lipids Yes    Intervention Provide education and support for participant on nutrition & aerobic/resistive exercise along with prescribed medications to achieve LDL 70mg , HDL >40mg .    Expected Outcomes Short Term: Participant states understanding of desired cholesterol values and is compliant with medications prescribed. Participant is following exercise prescription and nutrition guidelines.;Long Term: Cholesterol controlled with medications as prescribed, with individualized exercise RX and with personalized nutrition plan. Value goals: LDL < 70mg , HDL > 40 mg.          Core Components/Risk Factors/Patient Goals Review:    Core Components/Risk Factors/Patient Goals at Discharge (Final Review):    ITP Comments:  ITP Comments     Row Name 06/28/24 1506 07/11/24 1338 07/18/24 1109       ITP Comments Patient attend orientation today.  Patient is attending Cardiac Rehabilitation Program.  Documentation for diagnosis can be found in [facility].  Reviewed  medical chart, RPE/RPD, gym safety, and program guidelines.  Patient was fitted to equipment they will be using during rehab.  Patient is scheduled to start exercise on 07/06/24.   Initial ITP created and sent for review and signature by Dr. Dorn Ross, Medical Director for Cardiac Rehabilitation Program. First full day of exercise!  Patient was oriented to gym and equipment including functions, settings, policies, and procedures.  Patient's individual exercise prescription and treatment plan were reviewed.  All starting workloads were established based on the results of the 6 minute walk test done at initial orientation visit.  The plan for exercise progression was also introduced and progression will be customized based on patient's performance and goals. 30 day review completed. ITP sent to Dr. Dorn Ross, Medical Director of Cardiac Rehab. Continue with ITP unless changes are made by physician.    New to program        Comments: 30 day review

## 2024-07-18 NOTE — Progress Notes (Signed)
 Daily Session Note  Patient Details  Name: Rachel Odonnell MRN: [id number] Date of Birth: 02/04/1956 Referring Provider:   Flowsheet Row CARDIAC REHAB PHASE II ORIENTATION from 06/28/2024 in [facility] CARDIAC REHABILITATION  Referring Provider Alvan Carrier MD    Encounter Date: 07/18/2024  Check In:  Session Check In - 07/18/24 1346       Check-In   Supervising physician immediately available to respond to emergencies See telemetry face sheet for immediately available MD    Location [facility]    Staff Present Laymon Rattler, BSN, RN, Rosalba Gelineau, MA, RCEP, CCRP, CCET    Virtual Visit No    Medication changes reported     No    Fall or balance concerns reported    No    Tobacco Cessation No Change    Warm-up and Cool-down Performed on first and last piece of equipment    Resistance Training Performed Yes    VAD Patient? No    PAD/SET Patient? No      Pain Assessment   Currently in Pain? No/denies          Capillary Blood Glucose: No results found for this or any previous visit (from the past 24 hours).    Social History   Tobacco Use  Smoking Status Former   Current packs/day: 0.00   Types: Cigarettes   Quit date: 08/17/1994   Years since quitting: 29.9  Smokeless Tobacco Never    Goals Met:  Independence with exercise equipment Exercise tolerated well No report of concerns or symptoms today Strength training completed today  Goals Unmet:  Not Applicable  Comments: Pt able to follow exercise prescription today without complaint.  Will continue to monitor for progression.

## 2024-07-19 LAB — HEPATIC FUNCTION PANEL
ALT: 20 U/L (ref 7–35)
AST: 32 (ref 13–35)
Alkaline Phosphatase: 63 (ref 25–125)
Bilirubin, Total: 0.3

## 2024-07-19 LAB — BASIC METABOLIC PANEL WITH GFR
BUN: 19 (ref 4–21)
CO2: 22 (ref 13–22)
Chloride: 103 (ref 99–108)
Creatinine: 1.1 (ref 0.5–1.1)
Potassium: 4.1 meq/L (ref 3.5–5.1)
Sodium: 142 (ref 137–147)

## 2024-07-19 LAB — LIPID PANEL
Cholesterol: 139 (ref 0–200)
HDL: 38 (ref 35–70)
LDL Cholesterol: 70
Triglycerides: 184 — AB (ref 40–160)

## 2024-07-19 LAB — TSH: TSH: 3.35 (ref 0.41–5.90)

## 2024-07-20 ENCOUNTER — Encounter (HOSPITAL_COMMUNITY)
Admission: RE | Admit: 2024-07-20 | Discharge: 2024-07-20 | Disposition: A | Source: Ambulatory Visit | Attending: Cardiology

## 2024-07-20 DIAGNOSIS — I214 Non-ST elevation (NSTEMI) myocardial infarction: Secondary | ICD-10-CM | POA: Diagnosis not present

## 2024-07-20 DIAGNOSIS — Z951 Presence of aortocoronary bypass graft: Secondary | ICD-10-CM

## 2024-07-20 NOTE — Progress Notes (Signed)
 Daily Session Note  Patient Details  Name: Rachel Odonnell MRN: [id number] Date of Birth: 09-20-1955 Referring Provider:   Flowsheet Row CARDIAC REHAB PHASE II ORIENTATION from 06/28/2024 in [facility] CARDIAC REHABILITATION  Referring Provider Alvan Carrier MD    Encounter Date: 07/20/2024  Check In:  Session Check In - 07/20/24 1315       Check-In   Supervising physician immediately available to respond to emergencies See telemetry face sheet for immediately available MD    Location [facility]    Staff Present Rolland Sake BSN, RN;Codee Bloodworth Vicci, RN, BSN;Heather Con, BS, Exercise Physiologist    Virtual Visit No    Medication changes reported     No    Fall or balance concerns reported    No    Warm-up and Cool-down Performed on first and last piece of equipment    Resistance Training Performed Yes    VAD Patient? No    PAD/SET Patient? No      Pain Assessment   Currently in Pain? No/denies    Multiple Pain Sites No          Capillary Blood Glucose: No results found for this or any previous visit (from the past 24 hours).    Social History   Tobacco Use  Smoking Status Former   Current packs/day: 0.00   Types: Cigarettes   Quit date: 08/17/1994   Years since quitting: 29.9  Smokeless Tobacco Never    Goals Met:  Independence with exercise equipment Exercise tolerated well No report of concerns or symptoms today Strength training completed today  Goals Unmet:  Not Applicable  Comments: Pt able to follow exercise prescription today without complaint.  Will continue to monitor for progression.

## 2024-07-21 LAB — COMPREHENSIVE METABOLIC PANEL WITH GFR
Albumin: 4.3 (ref 3.5–5.0)
Calcium: 10.4 (ref 8.7–10.7)
EGFR: 53
Globulin: 2.7

## 2024-07-24 ENCOUNTER — Ambulatory Visit

## 2024-07-25 ENCOUNTER — Telehealth: Payer: Self-pay | Admitting: Cardiology

## 2024-07-25 ENCOUNTER — Ambulatory Visit: Attending: Cardiology

## 2024-07-25 ENCOUNTER — Inpatient Hospital Stay (HOSPITAL_COMMUNITY)
Admission: EM | Admit: 2024-07-25 | Discharge: 2024-07-29 | DRG: 322 | Disposition: A | Source: Ambulatory Visit | Attending: Internal Medicine | Admitting: Internal Medicine

## 2024-07-25 ENCOUNTER — Encounter (HOSPITAL_COMMUNITY): Payer: Self-pay | Admitting: Emergency Medicine

## 2024-07-25 ENCOUNTER — Emergency Department (HOSPITAL_COMMUNITY)

## 2024-07-25 ENCOUNTER — Other Ambulatory Visit: Payer: Self-pay

## 2024-07-25 ENCOUNTER — Encounter (HOSPITAL_COMMUNITY)

## 2024-07-25 DIAGNOSIS — R0789 Other chest pain: Principal | ICD-10-CM

## 2024-07-25 DIAGNOSIS — I4821 Permanent atrial fibrillation: Secondary | ICD-10-CM

## 2024-07-25 DIAGNOSIS — R079 Chest pain, unspecified: Secondary | ICD-10-CM | POA: Diagnosis present

## 2024-07-25 DIAGNOSIS — E1159 Type 2 diabetes mellitus with other circulatory complications: Secondary | ICD-10-CM | POA: Diagnosis present

## 2024-07-25 DIAGNOSIS — Z5181 Encounter for therapeutic drug level monitoring: Secondary | ICD-10-CM

## 2024-07-25 HISTORY — DX: Atherosclerotic heart disease of native coronary artery without angina pectoris: I25.10

## 2024-07-25 HISTORY — DX: Paroxysmal atrial fibrillation: I48.0

## 2024-07-25 LAB — CBC
HCT: 45.2 % (ref 36.0–46.0)
Hemoglobin: 14.4 g/dL (ref 12.0–15.0)
MCH: 28.9 pg (ref 26.0–34.0)
MCHC: 31.9 g/dL (ref 30.0–36.0)
MCV: 90.6 fL (ref 80.0–100.0)
Platelets: 335 K/uL (ref 150–400)
RBC: 4.99 MIL/uL (ref 3.87–5.11)
RDW: 13.6 % (ref 11.5–15.5)
WBC: 9 K/uL (ref 4.0–10.5)
nRBC: 0 % (ref 0.0–0.2)

## 2024-07-25 LAB — BASIC METABOLIC PANEL WITH GFR
Anion gap: 14 (ref 5–15)
BUN: 20 mg/dL (ref 8–23)
CO2: 23 mmol/L (ref 22–32)
Calcium: 10.6 mg/dL — ABNORMAL HIGH (ref 8.9–10.3)
Chloride: 102 mmol/L (ref 98–111)
Creatinine, Ser: 1.23 mg/dL — ABNORMAL HIGH (ref 0.44–1.00)
GFR, Estimated: 47 mL/min — ABNORMAL LOW (ref >60)
Glucose, Bld: 144 mg/dL — ABNORMAL HIGH (ref 70–99)
Potassium: 4.3 mmol/L (ref 3.5–5.1)
Sodium: 138 mmol/L (ref 135–145)

## 2024-07-25 LAB — PROTIME-INR
INR: 1.5 — ABNORMAL HIGH (ref 0.8–1.2)
Prothrombin Time: 18.5 s — ABNORMAL HIGH (ref 11.4–15.2)

## 2024-07-25 LAB — GLUCOSE, CAPILLARY: Glucose-Capillary: 133 mg/dL — ABNORMAL HIGH (ref 70–99)

## 2024-07-25 LAB — TROPONIN T, HIGH SENSITIVITY
Troponin T High Sensitivity: 17 ng/L (ref 0–19)
Troponin T High Sensitivity: 15 ng/L (ref 0–19)

## 2024-07-25 LAB — POCT INR: INR: 1.7 — AB (ref 2.0–3.0)

## 2024-07-25 LAB — PRO BRAIN NATRIURETIC PEPTIDE: Pro Brain Natriuretic Peptide: 235 pg/mL (ref <300.0)

## 2024-07-25 MED ORDER — ROSUVASTATIN CALCIUM 20 MG PO TABS
40.0000 mg | ORAL_TABLET | Freq: Every evening | ORAL | Status: AC
  Administered 2024-07-25 – 2024-07-28 (×4): 40 mg via ORAL
  Filled 2024-07-25 (×4): qty 2

## 2024-07-25 MED ORDER — PANTOPRAZOLE SODIUM 40 MG PO TBEC
80.0000 mg | DELAYED_RELEASE_TABLET | Freq: Every day | ORAL | Status: DC
  Administered 2024-07-25 – 2024-07-28 (×4): 80 mg via ORAL
  Filled 2024-07-25 (×4): qty 2

## 2024-07-25 MED ORDER — POLYETHYLENE GLYCOL 3350 17 G PO PACK: 17.0000 g | PACK | Freq: Every day | ORAL | Status: DC | PRN

## 2024-07-25 MED ORDER — WARFARIN - PHARMACIST DOSING INPATIENT: Freq: Every day | Status: DC

## 2024-07-25 MED ORDER — CITALOPRAM HYDROBROMIDE 20 MG PO TABS
20.0000 mg | ORAL_TABLET | Freq: Every evening | ORAL | Status: DC
  Administered 2024-07-25 – 2024-07-28 (×4): 20 mg via ORAL
  Filled 2024-07-25 (×4): qty 1

## 2024-07-25 MED ORDER — BUSPIRONE HCL 5 MG PO TABS
5.0000 mg | ORAL_TABLET | Freq: Every day | ORAL | Status: AC
  Administered 2024-07-25 – 2024-07-28 (×4): 5 mg via ORAL
  Filled 2024-07-25 (×4): qty 1

## 2024-07-25 MED ORDER — MELATONIN 3 MG PO TABS
6.0000 mg | ORAL_TABLET | Freq: Every evening | ORAL | Status: DC | PRN
  Filled 2024-07-25: qty 2

## 2024-07-25 MED ORDER — ACETAMINOPHEN 500 MG PO TABS
500.0000 mg | ORAL_TABLET | Freq: Four times a day (QID) | ORAL | Status: DC | PRN
  Administered 2024-07-26 – 2024-07-28 (×2): 500 mg via ORAL
  Filled 2024-07-25 (×2): qty 1

## 2024-07-25 MED ORDER — INSULIN ASPART 100 UNIT/ML IJ SOLN
0.0000 [IU] | Freq: Every day | INTRAMUSCULAR | Status: DC
  Administered 2024-07-26 – 2024-07-28 (×3): 2 [IU] via SUBCUTANEOUS
  Filled 2024-07-25: qty 1
  Filled 2024-07-25: qty 2
  Filled 2024-07-25: qty 1

## 2024-07-25 MED ORDER — HYDROCODONE-ACETAMINOPHEN 5-325 MG PO TABS
1.0000 | ORAL_TABLET | Freq: Once | ORAL | Status: AC
  Administered 2024-07-25: 1 via ORAL
  Filled 2024-07-25: qty 1

## 2024-07-25 MED ORDER — GABAPENTIN 100 MG PO CAPS
100.0000 mg | ORAL_CAPSULE | Freq: Every day | ORAL | Status: AC
  Administered 2024-07-25 – 2024-07-28 (×4): 100 mg via ORAL
  Filled 2024-07-25 (×4): qty 1

## 2024-07-25 MED ORDER — NITROGLYCERIN 2 % TD OINT
1.0000 [in_us] | TOPICAL_OINTMENT | Freq: Once | TRANSDERMAL | Status: AC
  Administered 2024-07-25: 1 [in_us] via TOPICAL
  Filled 2024-07-25: qty 1

## 2024-07-25 MED ORDER — INSULIN GLARGINE 100 UNIT/ML ~~LOC~~ SOLN
3.0000 [IU] | Freq: Two times a day (BID) | SUBCUTANEOUS | Status: DC
  Administered 2024-07-26 – 2024-07-29 (×6): 3 [IU] via SUBCUTANEOUS
  Filled 2024-07-25 (×11): qty 0.03

## 2024-07-25 MED ORDER — WARFARIN SODIUM 5 MG PO TABS: 5.0000 mg | ORAL_TABLET | Freq: Every day | ORAL | Status: DC

## 2024-07-25 MED ORDER — FENOFIBRATE 160 MG PO TABS
160.0000 mg | ORAL_TABLET | Freq: Every day | ORAL | Status: DC
  Administered 2024-07-25 – 2024-07-28 (×4): 160 mg via ORAL
  Filled 2024-07-25 (×6): qty 1

## 2024-07-25 MED ORDER — INSULIN ASPART 100 UNIT/ML IJ SOLN
0.0000 [IU] | Freq: Three times a day (TID) | INTRAMUSCULAR | Status: DC
  Administered 2024-07-26 – 2024-07-27 (×4): 2 [IU] via SUBCUTANEOUS
  Administered 2024-07-27 (×2): 3 [IU] via SUBCUTANEOUS
  Administered 2024-07-28: 2 [IU] via SUBCUTANEOUS
  Administered 2024-07-28: 3 [IU] via SUBCUTANEOUS
  Administered 2024-07-29: 2 [IU] via SUBCUTANEOUS
  Filled 2024-07-25 (×2): qty 2
  Filled 2024-07-25 (×7): qty 1

## 2024-07-25 MED ORDER — MONTELUKAST SODIUM 10 MG PO TABS
10.0000 mg | ORAL_TABLET | Freq: Every day | ORAL | Status: DC
  Administered 2024-07-25 – 2024-07-28 (×4): 10 mg via ORAL
  Filled 2024-07-25 (×4): qty 1

## 2024-07-25 MED ORDER — LEVOTHYROXINE SODIUM 50 MCG PO TABS
50.0000 ug | ORAL_TABLET | Freq: Every day | ORAL | Status: DC
  Administered 2024-07-26 – 2024-07-29 (×3): 50 ug via ORAL
  Filled 2024-07-25 (×3): qty 1

## 2024-07-25 MED ORDER — PROCHLORPERAZINE EDISYLATE 10 MG/2ML IJ SOLN: 5.0000 mg | Freq: Four times a day (QID) | INTRAMUSCULAR | Status: DC | PRN

## 2024-07-25 MED ORDER — ASPIRIN 325 MG PO TBEC
325.0000 mg | DELAYED_RELEASE_TABLET | Freq: Every day | ORAL | Status: DC
  Administered 2024-07-25 – 2024-07-28 (×4): 325 mg via ORAL
  Filled 2024-07-25 (×4): qty 1

## 2024-07-25 MED ORDER — ROPINIROLE HCL 1 MG PO TABS
2.0000 mg | ORAL_TABLET | Freq: Every day | ORAL | Status: AC
  Administered 2024-07-25 – 2024-07-28 (×4): 2 mg via ORAL
  Filled 2024-07-25: qty 4
  Filled 2024-07-25 (×3): qty 2

## 2024-07-25 MED ORDER — WARFARIN SODIUM 7.5 MG PO TABS
7.5000 mg | ORAL_TABLET | ORAL | Status: AC
  Administered 2024-07-25: 7.5 mg via ORAL
  Filled 2024-07-25: qty 1

## 2024-07-25 NOTE — Telephone Encounter (Signed)
 Spoke with patient - she is c/o jaw pain that started yesterday along with SOB which is new for her.  Feels like it did prior to having her heart attack.  No c/o active chest pain or syncopal feeling.  Does note pain in her chest when she takes a deep breath.  Only has wrist cuff - BP at time of call was 188/83  65 but is feeling anxious now as well.  Does not have NTG - states she was told to stop taking it.  Has OV scheduled for 08/11/24 with [name] in [place] office.  Will send message to provider for further advice.

## 2024-07-25 NOTE — ED Triage Notes (Signed)
 Pt complains of chest pain, jaw pain, SOB with exertion started yesterday. Pt had cardiac bypass in September and symptoms are similar to what they were prior to surgery.

## 2024-07-25 NOTE — Telephone Encounter (Signed)
 Left message to return call

## 2024-07-25 NOTE — Consult Note (Signed)
 PHARMACY - ANTICOAGULATION CONSULT NOTE  Pharmacy Consult for warfarin Indication: atrial fibrillation  Allergies  Allergen Reactions   Atomoxetine Nausea And Vomiting   Azithromycin Hives   Lisinopril Nausea And Vomiting   Atorvastatin Other (See Comments)    Restless legs   Codeine Nausea Only    Patient Measurements: Height: 5' 4 (162.6 cm) Weight: 96 kg (211 lb 10.3 oz) IBW/kg (Calculated) : 54.7 HEPARIN  DW (KG): 76.7  Vital Signs: Temp: 98 F (36.7 C) (12/09 1930) Temp Source: Oral (12/09 1930) BP: 151/75 (12/09 1930) Pulse Rate: 69 (12/09 1930)  Labs: Recent Labs    07/25/24 1153 07/25/24 1521  HGB  --  14.4  HCT  --  45.2  PLT  --  335  LABPROT  --  18.5*  INR 1.7* 1.5*  CREATININE  --  1.23*    Estimated Creatinine Clearance: 48.5 mL/min (A) (by C-G formula based on SCr of 1.23 mg/dL (H)).   Medical History: Past Medical History:  Diagnosis Date   ADD (attention deficit disorder)    Diabetes mellitus without complication (HCC)    Hypertension     Medications:  Warfarin 5mg  every Tuesday and 2.5mg  all other days for the week (20mg /week) TTR 57.5%  Assessment: Patient presents to ED with chest pain, and shortness of breath. INR Subtherapeutic on admission. Pharmacy consulted to manage warfarin while inpatient.   Goal of Therapy:  INR 2-3 Monitor platelets by anticoagulation protocol: Yes   Plan:  Give warfarin 7.5mg  po x 1 dose tonight (boost dose for INR 1.7) Daily INR until stable.  Bich Mchaney Rodriguez-Guzman PharmD, BCPS 07/25/2024 7:47 PM

## 2024-07-25 NOTE — H&P (Signed)
 History and Physical  [name]:[id number] DOB: [birth year] DOA: 07/25/2024  Referring physician: Dr. Suzette, EDP  PCP: Skillman, Katherine E, PA-C  Outpatient Specialists: Cardiology, Cardiothoracic surgery. Patient coming from: Home  Chief Complaint: Chest pain   HPI: Rachel Odonnell is a 68 y.o. female with medical history significant for CAD s/p one-vessel bypass (September 2025), permanent A-fib on Coumadin , DM2, HTN, HLD, Hypothyroidism, chronic anxiety/depression, diabetic polyneuropathy, GERD, obesity, who presents to the ER with complaints of chest pain, jaw pain and exertional shortness of breath.  Symptoms are similar to previous chest pain that led to CABG.  In the ER, hypertensive.  First 12 lead EKG showed no evidence of acute ischemia.  High sensitive troponin negative x 2.  EDP discussed the case with cardiology who recommended to trend troponin.  Admitted by [facility], Hospitalist service, for chest pain rule out ACS.  ED Course: Temp 98.  BP 151/75, HR 69, RR 15, O2 sat 96%.  Review of Systems: Review of systems as noted in the HPI. All other systems reviewed and are negative.   Past Medical History:  Diagnosis Date   ADD (attention deficit disorder)    Diabetes mellitus without complication (HCC)    Hypertension    Past Surgical History:  Procedure Laterality Date   ABDOMINAL HYSTERECTOMY     COLONOSCOPY  02/2022   CORONARY ARTERY BYPASS GRAFT N/A 05/06/2024   Procedure: OFF PUMP CORONARY ARTERY BYPASS GRAFTING (CABG) TIMES ONE USING LEFT INTERNAL MAMMARY ARTERY;  Surgeon: Shyrl Linnie KIDD, MD;  Location: [facility] OR;  Service: Open Heart Surgery;  Laterality: N/A;   CORONARY PRESSURE/FFR STUDY N/A 04/16/2023   Procedure: CORONARY PRESSURE/FFR STUDY;  Surgeon: Jordan, Peter M, MD;  Location: [facility] INVASIVE CV LAB;  Service: Cardiovascular;  Laterality: N/A;   CORONARY STENT INTERVENTION N/A 04/16/2023   Procedure: CORONARY STENT INTERVENTION;  Surgeon: Jordan, Peter M,  MD;  Location: [facility] INVASIVE CV LAB;  Service: Cardiovascular;  Laterality: N/A;   CORONARY ULTRASOUND/IVUS N/A 04/16/2023   Procedure: Coronary Ultrasound/IVUS;  Surgeon: Jordan, Peter M, MD;  Location: [facility] INVASIVE CV LAB;  Service: Cardiovascular;  Laterality: N/A;   EXCISIONAL HEMORRHOIDECTOMY     INCONTINENCE SURGERY     INTRAOPERATIVE TRANSESOPHAGEAL ECHOCARDIOGRAM N/A 05/06/2024   Procedure: ECHOCARDIOGRAM, TRANSESOPHAGEAL, INTRAOPERATIVE;  Surgeon: Shyrl Linnie KIDD, MD;  Location: [facility] OR;  Service: Open Heart Surgery;  Laterality: N/A;   LEFT HEART CATH AND CORONARY ANGIOGRAPHY N/A 05/04/2024   Procedure: LEFT HEART CATH AND CORONARY ANGIOGRAPHY;  Surgeon: Darron Deatrice LABOR, MD;  Location: [facility] INVASIVE CV LAB;  Service: Cardiovascular;  Laterality: N/A;   RIGHT/LEFT HEART CATH AND CORONARY ANGIOGRAPHY N/A 06/13/2021   Procedure: RIGHT/LEFT HEART CATH AND CORONARY ANGIOGRAPHY;  Surgeon: Jordan, Peter M, MD;  Location: [facility] INVASIVE CV LAB;  Service: Cardiovascular;  Laterality: N/A;   RIGHT/LEFT HEART CATH AND CORONARY ANGIOGRAPHY N/A 04/16/2023   Procedure: RIGHT/LEFT HEART CATH AND CORONARY ANGIOGRAPHY;  Surgeon: Jordan, Peter M, MD;  Location: [facility] INVASIVE CV LAB;  Service: Cardiovascular;  Laterality: N/A;    Social History:  reports that she quit smoking about 29 years ago. Her smoking use included cigarettes. She has never used smokeless tobacco. She reports that she does not drink alcohol  and does not use drugs.   Allergies  Allergen Reactions   Atomoxetine Nausea And Vomiting   Azithromycin Hives   Lisinopril Nausea And Vomiting   Atorvastatin Other (See Comments)    Restless legs   Codeine Nausea Only  Family History  Problem Relation Age of Onset   Cancer Mother    Diabetes Father    Heart Problems Father    COPD Father    Emphysema Father    Heart block Father    Congestive Heart Failure Father       Prior to Admission medications   Medication Sig Start Date End  Date Taking? Authorizing Provider  aspirin  EC 325 MG tablet Take 1 tablet (325 mg total) by mouth daily. 05/13/24  Yes Barrett, Erin R, PA-C  busPIRone  (BUSPAR ) 5 MG tablet Take 5 mg by mouth at bedtime. 01/27/22  Yes [provider]  Cholecalciferol  (VITAMIN D3) 125 MCG (5000 UT) CAPS Take 1 capsule (5,000 Units total) by mouth daily. Patient taking differently: Take 5,000 Units by mouth at bedtime. 12/16/21  Yes Nida, Gebreselassie W, MD  citalopram  (CELEXA ) 20 MG tablet Take 1 tablet (20 mg total) by mouth every evening. 06/09/23  Yes Miriam Norris, NP  donepezil  (ARICEPT ) 5 MG tablet Take 5 mg by mouth every evening. 08/06/22  Yes [provider]  empagliflozin  (JARDIANCE ) 25 MG TABS tablet Take 1 tablet (25 mg total) by mouth daily. Patient taking differently: Take 25 mg by mouth at bedtime. 03/23/24  Yes Nida, Ethelle ORN, MD  estradiol  (ESTRACE ) 1 MG tablet Take 1 mg by mouth at bedtime. 02/07/24  Yes [provider]  fenofibrate  160 MG tablet Take 1 tablet (160 mg total) by mouth daily. Patient taking differently: Take 160 mg by mouth at bedtime. 05/13/24  Yes Barrett, Erin R, PA-C  gabapentin  (NEURONTIN ) 100 MG capsule Take 1 capsule (100 mg total) by mouth at bedtime. 04/17/23  Yes Dunn, Dayna N, PA-C  HUMALOG  KWIKPEN 100 UNIT/ML KwikPen INJECT SUBCUTANEOUSLY 16 TO 22  UNITS INTO THE SKIN 3 TIMES  DAILY BEFORE MEALS Patient taking differently: Inject 19-20 Units into the skin 3 (three) times daily. If <150 BS, do not take 05/29/24  Yes Nida, Gebreselassie W, MD  insulin  degludec (TRESIBA  FLEXTOUCH) 200 UNIT/ML FlexTouch Pen Inject 110 Units into the skin at bedtime. 03/23/24  Yes Nida, Gebreselassie W, MD  levocetirizine (XYZAL ) 5 MG tablet Take 5 mg by mouth every evening.   Yes [provider]  levothyroxine  (SYNTHROID ) 50 MCG tablet TAKE 1 TABLET BY MOUTH DAILY  BEFORE BREAKFAST Patient taking differently: Take 50 mcg by mouth at bedtime. 11/11/23  Yes  Nida, Gebreselassie W, MD  Magnesium  Oxide (MAGNESIUM  OXIDE 400) 240 MG PACK Take 400 mg by mouth daily. Patient taking differently: Take 400 mg by mouth at bedtime. 07/19/23  Yes Miriam Norris, NP  metFORMIN  (GLUCOPHAGE ) 500 MG tablet TAKE 1 TABLET BY MOUTH DAILY  WITH BREAKFAST Patient taking differently: Take 500 mg by mouth at bedtime. 11/11/23  Yes Nida, Gebreselassie W, MD  montelukast  (SINGULAIR ) 10 MG tablet Take 10 mg by mouth at bedtime.   Yes [provider]  OVER THE COUNTER MEDICATION Take 2 capsules by mouth at bedtime. Omega XL   Yes [provider]  pantoprazole  (PROTONIX ) 40 MG tablet TAKE 2 TABLETS EVERY DAY Patient taking differently: Take 80 mg by mouth at bedtime. 08/19/23  Yes Miriam Norris, NP  rOPINIRole  (REQUIP ) 2 MG tablet Take 2 mg by mouth at bedtime. 06/22/23  Yes [provider]  rosuvastatin  (CRESTOR ) 40 MG tablet Take 1 tablet (40 mg total) by mouth daily. Patient taking differently: Take 40 mg by mouth at bedtime. 04/17/23  Yes Dunn, Dayna N, PA-C  Semaglutide ,0.25 or 0.5MG /DOS, (OZEMPIC ,  0.25 OR 0.5 MG/DOSE,) 2 MG/1.5ML SOPN Inject 0.5 mg into the skin every Thursday.   Yes [provider]  warfarin (COUMADIN ) 5 MG tablet TAKE ONE-HALF TABLET TO 1 TABLET BY MOUTH  DAILY OR AS DIRECTED Patient taking differently: Take 5 mg by mouth daily at 4 PM. Take 0.5 tablet on Sunday, Monday, Wednesday, Thursday, Friday, and Saturday daily and then take 1 tablet on Tuesdays. 07/03/24  Yes Debera Jayson MATSU, MD  Accu-Chek Softclix Lancets lancets Use as instructed to test blood glucose four times daily 06/09/22   Nida, Gebreselassie W, MD  Continuous Glucose Receiver (FREESTYLE LIBRE 2 READER) DEVI USE TO CHECK GLUCOSE AS DIRECTED 12/04/22   Nida, Gebreselassie W, MD  Continuous Glucose Sensor (FREESTYLE LIBRE 2 SENSOR) MISC CHANGE SENSOR EVERY 14 DAYS USE  TO CHECK BLOOD GLUCOSE  CONTINUOUSLY 11/01/23   Lenis Ethelle ORN, MD  glucose blood  test strip 1 each by Other route as needed. Use as instructed to test blood glucose four times daily 06/09/22   Nida, Gebreselassie W, MD  Insulin  Pen Needle (B-D ULTRAFINE III SHORT PEN) 31G X 8 MM MISC 1 each by Does not apply route as directed. 01/06/22   Nida, Gebreselassie W, MD  modafinil  (PROVIGIL ) 100 MG tablet Take 1 tablet (100 mg total) by mouth daily. Patient not taking: Reported on 07/25/2024 03/02/24   Jude Harden GAILS, MD  Vibegron  (GEMTESA ) 75 MG TABS Take 1 tablet (75 mg total) by mouth daily. 02/01/24   Gerldine Lauraine BROCKS, FNP    Physical Exam: BP (!) 151/75   Pulse 69   Temp 98 F (36.7 C) (Oral)   Resp 15   Ht 5' 4 (1.626 m)   Wt 96 kg   SpO2 96%   BMI 36.33 kg/m   General: 68 y.o. year-old female well developed well nourished in no acute distress.  Alert and oriented x3. Cardiovascular: Regular rate and rhythm with no rubs or gallops.  No thyromegaly or JVD noted.  No lower extremity edema. 2/4 pulses in all 4 extremities. Respiratory: Clear to auscultation with no wheezes or rales. Good inspiratory effort. Abdomen: Soft nontender nondistended with normal bowel sounds x4 quadrants. Muskuloskeletal: No cyanosis, clubbing or edema noted bilaterally Neuro: CN II-XII intact, strength, sensation, reflexes Skin: No ulcerative lesions noted or rashes Psychiatry: Judgement and insight appear normal. Mood is appropriate for condition and setting          Labs on Admission:  Basic Metabolic Panel: Recent Labs  Lab 07/19/24 0000 07/25/24 1521  NA 142 138  K 4.1 4.3  CL 103 102  CO2 22 23  GLUCOSE  --  144*  BUN 19 20  CREATININE 1.1 1.23*  CALCIUM  10.4 10.6*   Liver Function Tests: Recent Labs  Lab 07/19/24 0000  AST 32  ALT 20  ALKPHOS 63  ALBUMIN  4.3   No results for input(s): LIPASE, AMYLASE in the last 168 hours. No results for input(s): AMMONIA in the last 168 hours. CBC: Recent Labs  Lab 07/25/24 1521  WBC 9.0  HGB 14.4  HCT 45.2  MCV 90.6   PLT 335   Cardiac Enzymes: No results for input(s): CKTOTAL, CKMB, CKMBINDEX, TROPONINI in the last 168 hours.  BNP (last 3 results) No results for input(s): BNP in the last 8760 hours.  ProBNP (last 3 results) Recent Labs    07/25/24 1521  PROBNP 235.0    CBG: No results for input(s): GLUCAP in the last 168 hours.  Radiological  Exams on Admission: DG Chest Port 1 View Result Date: 07/25/2024 CLINICAL DATA:  Chest pain EXAM: PORTABLE CHEST 1 VIEW COMPARISON:  Prior chest x-ray 06/23/2024 FINDINGS: Patient is status post median sternotomy. Cardiac and mediastinal contours are unchanged. Lungs are clear save for mild chronic bronchitic changes. No overt edema, pleural effusion, pneumothorax or focal airspace infiltrate. No acute osseous abnormality. IMPRESSION: No active disease. Electronically Signed   By: Wilkie Lent M.D.   On: 07/25/2024 16:19    EKG: I independently viewed the EKG done and my findings are as followed: SB 56, non specific ST T changes.  QTC 479.   Assessment/Plan Present on Admission:  Chest pain  Principal Problem:   Chest pain  Chest pain r/o ACS First 12 lead EKG showed no evidence of acute ischemia.  High sensitive troponin negative x 2.  Resume Home full dose aspirin  and Crestor  Nitropaste as ordered by EDP Follow TTE  Permanent A-fibrillation INR 1.5 Pharmacy consulted to manage coumadin  Resume home Coumadin  Rate controlled Monitor on telemetry  Subtherapeutic INR INR 1.5 Continue home Coumadin  Repeat INR in the morning  DM2 with hyperglycemia Last hA1C 9.2 (05/05/24) Long acting and short acting insulin  Heart healthy carb modified diet  Diabetic polyneuropathy Resume home gabapentin   Hypertension, BP is not at goal Resume home oral antihypertensives Closely monitor vital signs  HLD Resume home crestor , fenofibrate   Hypothyroidism Resume home Levothyroxine   Gerd Resume home PPI  Chronic  anxiety/depression Resume home regimen   Time: 75 minutes   DVT prophylaxis: Home Coumadin    Code Status: Full Code   Family Communication: None at bedside   Disposition Plan: Telemetry   Consults called: None   Admission status: Observation    Status is: Observation    Terry LOISE Hurst MD [facility] Pager [phone]  If 7PM-7AM, please contact night-coverage [email or web address] Password [email or web address]  07/25/2024, 7:44 PM

## 2024-07-25 NOTE — Patient Instructions (Signed)
Increase warfarin to 1/2 tablet daily except 1 tablet on Tuesdays.  Recheck INR in 2 week.

## 2024-07-25 NOTE — Telephone Encounter (Signed)
 Pt is here for her visit with O[facility] for her coumadin  check  Pt stated that yesterday she began to have jaw pain, which she was told was a warning sign for heart attack  Would like to know if she needs to see Almarie sooner

## 2024-07-25 NOTE — ED Provider Notes (Signed)
 [place] [facility] DEPARTMENT AT [facility] Provider Note   CSN: [id number] Arrival date & time: 07/25/24  1510     Patient presents with: Chest Pain and Shortness of Breath   Rachel Odonnell is a 68 y.o. female.  {Add pertinent medical, surgical, social history, OB history to YEP:67052} Patient with a history of coronary disease and one-vessel bypass.  She started having neck pain similar to the pain she had prior to her bypass surgery.  The pain started yesterday and is worse with exertion   Chest Pain Associated symptoms: shortness of breath   Shortness of Breath Associated symptoms: chest pain        Prior to Admission medications   Medication Sig Start Date End Date Taking? Authorizing Provider  aspirin  EC 325 MG tablet Take 1 tablet (325 mg total) by mouth daily. 05/13/24  Yes Barrett, Erin R, PA-C  busPIRone  (BUSPAR ) 5 MG tablet Take 5 mg by mouth at bedtime. 01/27/22  Yes [provider]  Cholecalciferol  (VITAMIN D3) 125 MCG (5000 UT) CAPS Take 1 capsule (5,000 Units total) by mouth daily. Patient taking differently: Take 5,000 Units by mouth at bedtime. 12/16/21  Yes Nida, Gebreselassie W, MD  citalopram  (CELEXA ) 20 MG tablet Take 1 tablet (20 mg total) by mouth every evening. 06/09/23  Yes Miriam Norris, NP  donepezil  (ARICEPT ) 5 MG tablet Take 5 mg by mouth every evening. 08/06/22  Yes [provider]  empagliflozin  (JARDIANCE ) 25 MG TABS tablet Take 1 tablet (25 mg total) by mouth daily. Patient taking differently: Take 25 mg by mouth at bedtime. 03/23/24  Yes Nida, Gebreselassie W, MD  estradiol  (ESTRACE ) 1 MG tablet Take 1 mg by mouth at bedtime. 02/07/24  Yes [provider]  fenofibrate  160 MG tablet Take 1 tablet (160 mg total) by mouth daily. Patient taking differently: Take 160 mg by mouth at bedtime. 05/13/24  Yes Barrett, Erin R, PA-C  gabapentin  (NEURONTIN ) 100 MG capsule Take 1 capsule (100 mg total) by mouth at bedtime.  04/17/23  Yes Dunn, Dayna N, PA-C  HUMALOG  KWIKPEN 100 UNIT/ML KwikPen INJECT SUBCUTANEOUSLY 16 TO 22  UNITS INTO THE SKIN 3 TIMES  DAILY BEFORE MEALS Patient taking differently: Inject 19-20 Units into the skin 3 (three) times daily. If <150 BS, do not take 05/29/24  Yes Nida, Gebreselassie W, MD  insulin  degludec (TRESIBA  FLEXTOUCH) 200 UNIT/ML FlexTouch Pen Inject 110 Units into the skin at bedtime. 03/23/24  Yes Nida, Gebreselassie W, MD  levocetirizine (XYZAL ) 5 MG tablet Take 5 mg by mouth every evening.   Yes [provider]  levothyroxine  (SYNTHROID ) 50 MCG tablet TAKE 1 TABLET BY MOUTH DAILY  BEFORE BREAKFAST Patient taking differently: Take 50 mcg by mouth at bedtime. 11/11/23  Yes Nida, Gebreselassie W, MD  Magnesium  Oxide (MAGNESIUM  OXIDE 400) 240 MG PACK Take 400 mg by mouth daily. Patient taking differently: Take 400 mg by mouth at bedtime. 07/19/23  Yes Miriam Norris, NP  metFORMIN  (GLUCOPHAGE ) 500 MG tablet TAKE 1 TABLET BY MOUTH DAILY  WITH BREAKFAST Patient taking differently: Take 500 mg by mouth at bedtime. 11/11/23  Yes Nida, Gebreselassie W, MD  montelukast  (SINGULAIR ) 10 MG tablet Take 10 mg by mouth at bedtime.   Yes [provider]  OVER THE COUNTER MEDICATION Take 2 capsules by mouth at bedtime. Omega XL   Yes [provider]  pantoprazole  (PROTONIX ) 40 MG tablet TAKE 2 TABLETS EVERY DAY Patient taking differently: Take 80 mg by mouth  at bedtime. 08/19/23  Yes Miriam Norris, NP  rOPINIRole  (REQUIP ) 2 MG tablet Take 2 mg by mouth at bedtime. 06/22/23  Yes [provider]  rosuvastatin  (CRESTOR ) 40 MG tablet Take 1 tablet (40 mg total) by mouth daily. Patient taking differently: Take 40 mg by mouth at bedtime. 04/17/23  Yes Dunn, Dayna N, PA-C  Semaglutide ,0.25 or 0.5MG /DOS, (OZEMPIC , 0.25 OR 0.5 MG/DOSE,) 2 MG/1.5ML SOPN Inject 0.5 mg into the skin every Thursday.   Yes [provider]  warfarin (COUMADIN ) 5 MG tablet TAKE ONE-HALF  TABLET TO 1 TABLET BY MOUTH  DAILY OR AS DIRECTED Patient taking differently: Take 5 mg by mouth daily at 4 PM. Take 0.5 tablet on Sunday, Monday, Wednesday, Thursday, Friday, and Saturday daily and then take 1 tablet on Tuesdays. 07/03/24  Yes Debera Jayson MATSU, MD  Accu-Chek Softclix Lancets lancets Use as instructed to test blood glucose four times daily 06/09/22   Nida, Gebreselassie W, MD  Continuous Glucose Receiver (FREESTYLE LIBRE 2 READER) DEVI USE TO CHECK GLUCOSE AS DIRECTED 12/04/22   Nida, Gebreselassie W, MD  Continuous Glucose Sensor (FREESTYLE LIBRE 2 SENSOR) MISC CHANGE SENSOR EVERY 14 DAYS USE  TO CHECK BLOOD GLUCOSE  CONTINUOUSLY 11/01/23   Lenis Ethelle ORN, MD  glucose blood test strip 1 each by Other route as needed. Use as instructed to test blood glucose four times daily 06/09/22   Nida, Gebreselassie W, MD  Insulin  Pen Needle (B-D ULTRAFINE III SHORT PEN) 31G X 8 MM MISC 1 each by Does not apply route as directed. 01/06/22   Nida, Gebreselassie W, MD  modafinil  (PROVIGIL ) 100 MG tablet Take 1 tablet (100 mg total) by mouth daily. Patient not taking: Reported on 07/25/2024 03/02/24   Jude Harden GAILS, MD  Vibegron  (GEMTESA ) 75 MG TABS Take 1 tablet (75 mg total) by mouth daily. 02/01/24   Gerldine Lauraine BROCKS, FNP    Allergies: Atomoxetine, Azithromycin, Lisinopril, Atorvastatin, and Codeine    Review of Systems  Respiratory:  Positive for shortness of breath.   Cardiovascular:  Positive for chest pain.    Updated Vital Signs BP 131/66   Pulse (!) 57   Temp 97.8 F (36.6 C) (Oral)   Resp 19   Ht 5' 4 (1.626 m)   Wt 96 kg   SpO2 99%   BMI 36.33 kg/m   Physical Exam  (all labs ordered are listed, but only abnormal results are displayed) Labs Reviewed  BASIC METABOLIC PANEL WITH GFR - Abnormal; Notable for the following components:      Result Value   Glucose, Bld 144 (*)    Creatinine, Ser 1.23 (*)    Calcium  10.6 (*)    GFR, Estimated 47 (*)    All other  components within normal limits  PROTIME-INR - Abnormal; Notable for the following components:   Prothrombin Time 18.5 (*)    INR 1.5 (*)    All other components within normal limits  CBC  PRO BRAIN NATRIURETIC PEPTIDE  TROPONIN T, HIGH SENSITIVITY  TROPONIN T, HIGH SENSITIVITY    EKG: None  Radiology: DG Chest Port 1 View Result Date: 07/25/2024 CLINICAL DATA:  Chest pain EXAM: PORTABLE CHEST 1 VIEW COMPARISON:  Prior chest x-ray 06/23/2024 FINDINGS: Patient is status post median sternotomy. Cardiac and mediastinal contours are unchanged. Lungs are clear save for mild chronic bronchitic changes. No overt edema, pleural effusion, pneumothorax or focal airspace infiltrate. No acute osseous abnormality. IMPRESSION: No active disease. Electronically Signed   By:  Wilkie Lent M.D.   On: 07/25/2024 16:19    {Document cardiac monitor, telemetry assessment procedure when appropriate:32947} Procedures   Medications Ordered in the ED  nitroGLYCERIN  (NITROGLYN) 2 % ointment 1 inch (has no administration in time range)  HYDROcodone -acetaminophen  (NORCO/VICODIN) 5-325 MG per tablet 1 tablet (1 tablet Oral Given 07/25/24 1653)   Cardiology was talked to today and Dr Loman recommended admission to medicine with cardiology consult tomorrow   {Click here for ABCD2, HEART and other calculators REFRESH Note before signing:1}                              Medical Decision Making Amount and/or Complexity of Data Reviewed Labs: ordered. Radiology: ordered.  Risk Prescription drug management. Decision regarding hospitalization.   Patient with possible angina symptoms and normal troponins.  She will be admitted to medicine with cardiology consult tomorrow  {Document critical care time when appropriate  Document review of labs and clinical decision tools ie CHADS2VASC2, etc  Document your independent review of radiology images and any outside records  Document your discussion with family  members, caretakers and with consultants  Document social determinants of health affecting pt's care  Document your decision making why or why not admission, treatments were needed:32947:::1}   Final diagnoses:  Atypical chest pain    ED Discharge Orders     None

## 2024-07-25 NOTE — Telephone Encounter (Signed)
 Patient notified and verbalized understanding.  She will have her husband take her to [facility].

## 2024-07-25 NOTE — Progress Notes (Signed)
 INR 1.7; Please see anticoagulation encounter

## 2024-07-26 ENCOUNTER — Observation Stay (HOSPITAL_COMMUNITY)

## 2024-07-26 ENCOUNTER — Ambulatory Visit: Admitting: "Endocrinology

## 2024-07-26 ENCOUNTER — Encounter (HOSPITAL_COMMUNITY): Payer: Self-pay | Admitting: Internal Medicine

## 2024-07-26 ENCOUNTER — Other Ambulatory Visit (HOSPITAL_COMMUNITY): Payer: Self-pay | Admitting: *Deleted

## 2024-07-26 ENCOUNTER — Observation Stay (HOSPITAL_BASED_OUTPATIENT_CLINIC_OR_DEPARTMENT_OTHER)

## 2024-07-26 DIAGNOSIS — I480 Paroxysmal atrial fibrillation: Secondary | ICD-10-CM

## 2024-07-26 DIAGNOSIS — I10 Essential (primary) hypertension: Secondary | ICD-10-CM

## 2024-07-26 DIAGNOSIS — E785 Hyperlipidemia, unspecified: Secondary | ICD-10-CM

## 2024-07-26 DIAGNOSIS — R0789 Other chest pain: Secondary | ICD-10-CM

## 2024-07-26 DIAGNOSIS — I959 Hypotension, unspecified: Secondary | ICD-10-CM

## 2024-07-26 DIAGNOSIS — R079 Chest pain, unspecified: Secondary | ICD-10-CM | POA: Diagnosis not present

## 2024-07-26 DIAGNOSIS — I350 Nonrheumatic aortic (valve) stenosis: Secondary | ICD-10-CM

## 2024-07-26 DIAGNOSIS — I2510 Atherosclerotic heart disease of native coronary artery without angina pectoris: Secondary | ICD-10-CM

## 2024-07-26 LAB — ECHOCARDIOGRAM COMPLETE
AR max vel: 1.1 cm2
AV Area VTI: 1.78 cm2
AV Area mean vel: 1.53 cm2
AV Mean grad: 7 mmHg
AV Peak grad: 20.1 mmHg
Ao pk vel: 2.24 m/s
Area-P 1/2: 2.83 cm2
Height: 64 in
S' Lateral: 2.7 cm
Weight: 3495.61 [oz_av]

## 2024-07-26 LAB — BASIC METABOLIC PANEL WITH GFR
Anion gap: 6 (ref 5–15)
BUN: 19 mg/dL (ref 8–23)
CO2: 31 mmol/L (ref 22–32)
Calcium: 9.7 mg/dL (ref 8.9–10.3)
Chloride: 101 mmol/L (ref 98–111)
Creatinine, Ser: 1.15 mg/dL — ABNORMAL HIGH (ref 0.44–1.00)
GFR, Estimated: 51 mL/min — ABNORMAL LOW (ref >60)
Glucose, Bld: 163 mg/dL — ABNORMAL HIGH (ref 70–99)
Potassium: 4.7 mmol/L (ref 3.5–5.1)
Sodium: 138 mmol/L (ref 135–145)

## 2024-07-26 LAB — GLUCOSE, CAPILLARY
Glucose-Capillary: 133 mg/dL — ABNORMAL HIGH (ref 70–99)
Glucose-Capillary: 181 mg/dL — ABNORMAL HIGH (ref 70–99)
Glucose-Capillary: 171 mg/dL — ABNORMAL HIGH (ref 70–99)
Glucose-Capillary: 153 mg/dL — ABNORMAL HIGH (ref 70–99)
Glucose-Capillary: 219 mg/dL — ABNORMAL HIGH (ref 70–99)

## 2024-07-26 LAB — HEPARIN LEVEL (UNFRACTIONATED)
Heparin Unfractionated: 0.34 [IU]/mL (ref 0.30–0.70)
Heparin Unfractionated: 0.24 [IU]/mL — ABNORMAL LOW (ref 0.30–0.70)

## 2024-07-26 LAB — PROTIME-INR
INR: 1.5 — ABNORMAL HIGH (ref 0.8–1.2)
Prothrombin Time: 19 s — ABNORMAL HIGH (ref 11.4–15.2)

## 2024-07-26 LAB — CBC
HCT: 42.1 % (ref 36.0–46.0)
Hemoglobin: 13.4 g/dL (ref 12.0–15.0)
MCH: 29.1 pg (ref 26.0–34.0)
MCHC: 31.8 g/dL (ref 30.0–36.0)
MCV: 91.3 fL (ref 80.0–100.0)
Platelets: 306 K/uL (ref 150–400)
RBC: 4.61 MIL/uL (ref 3.87–5.11)
RDW: 13.5 % (ref 11.5–15.5)
WBC: 9.1 K/uL (ref 4.0–10.5)
nRBC: 0 % (ref 0.0–0.2)

## 2024-07-26 LAB — PHOSPHORUS: Phosphorus: 3.8 mg/dL (ref 2.5–4.6)

## 2024-07-26 LAB — TROPONIN T, HIGH SENSITIVITY
Troponin T High Sensitivity: 16 ng/L (ref 0–19)
Troponin T High Sensitivity: 18 ng/L (ref 0–19)

## 2024-07-26 LAB — MAGNESIUM: Magnesium: 2.6 mg/dL — ABNORMAL HIGH (ref 1.7–2.4)

## 2024-07-26 MED ORDER — WARFARIN SODIUM 2.5 MG PO TABS: 2.5000 mg | ORAL_TABLET | Freq: Once | ORAL | Status: DC

## 2024-07-26 MED ORDER — KETOROLAC TROMETHAMINE 15 MG/ML IJ SOLN
15.0000 mg | Freq: Once | INTRAMUSCULAR | Status: AC
  Administered 2024-07-26: 15 mg via INTRAVENOUS
  Filled 2024-07-26: qty 1

## 2024-07-26 MED ORDER — PERFLUTREN LIPID MICROSPHERE
1.0000 mL | INTRAVENOUS | Status: AC | PRN
  Administered 2024-07-26: 3 mL via INTRAVENOUS

## 2024-07-26 MED ORDER — MORPHINE SULFATE (PF) 2 MG/ML IV SOLN
2.0000 mg | INTRAVENOUS | Status: AC
  Administered 2024-07-26: 2 mg via INTRAVENOUS
  Filled 2024-07-26: qty 1

## 2024-07-26 MED ORDER — HEPARIN BOLUS VIA INFUSION
3000.0000 [IU] | Freq: Once | INTRAVENOUS | Status: AC
  Administered 2024-07-26: 3000 [IU] via INTRAVENOUS
  Filled 2024-07-26: qty 3000

## 2024-07-26 MED ORDER — HEPARIN (PORCINE) 25000 UT/250ML-% IV SOLN
1550.0000 [IU]/h | INTRAVENOUS | Status: DC
  Administered 2024-07-26: 1200 [IU]/h via INTRAVENOUS
  Administered 2024-07-27: 1350 [IU]/h via INTRAVENOUS
  Administered 2024-07-27: 1550 [IU]/h via INTRAVENOUS
  Filled 2024-07-26 (×3): qty 250

## 2024-07-26 MED ORDER — ISOSORBIDE MONONITRATE ER 30 MG PO TB24
30.0000 mg | ORAL_TABLET | Freq: Every day | ORAL | Status: DC
  Administered 2024-07-26 – 2024-07-27 (×2): 30 mg via ORAL
  Filled 2024-07-26 (×2): qty 1

## 2024-07-26 NOTE — Consult Note (Signed)
 PHARMACY - ANTICOAGULATION CONSULT NOTE  Pharmacy Consult for heparin - holding warfarin Indication: atrial fibrillation  Allergies  Allergen Reactions   Atomoxetine Nausea And Vomiting   Azithromycin Hives   Lisinopril Nausea And Vomiting   Atorvastatin Other (See Comments)    Restless legs   Codeine Nausea Only    Patient Measurements: Height: 5' 4 (162.6 cm) Weight: 99.1 kg (218 lb 7.6 oz) IBW/kg (Calculated) : 54.7 HEPARIN  DW (KG): 76.7  Vital Signs: Temp: 97.8 F (36.6 C) (12/10 0804) Temp Source: Oral (12/10 0804) BP: 119/49 (12/10 0804) Pulse Rate: 56 (12/10 0804)  Labs: Recent Labs    07/25/24 1153 07/25/24 1521 07/26/24 0158 07/26/24 1640  HGB  --  14.4 13.4  --   HCT  --  45.2 42.1  --   PLT  --  335 306  --   LABPROT  --  18.5* 19.0*  --   INR 1.7* 1.5* 1.5*  --   HEPARINUNFRC  --   --   --  0.34  CREATININE  --  1.23* 1.15*  --     Estimated Creatinine Clearance: 52.8 mL/min (A) (by C-G formula based on SCr of 1.15 mg/dL (H)).   Medical History: Past Medical History:  Diagnosis Date   ADD (attention deficit disorder)    CAD (coronary artery disease)    Diabetes mellitus without complication (HCC)    Hypertension    PAF (paroxysmal atrial fibrillation) (HCC)     Medications:  Warfarin 5mg  every Tuesday and 2.5mg  all other days for the week (20mg /week) TTR 57.5%  Assessment: Patient presents to ED with chest pain, and shortness of breath. INR Subtherapeutic on admission. Pharmacy consulted to hold warfarin and dose heparin  for potential cardiac cath.  HL 0.34- therapeutic INR 1.5> 1.5 CBC WNL  Goal of Therapy:  INR 2-3 Monitor platelets by anticoagulation protocol: Yes   Plan:  Hold warfarin Continue heparin  infusion at 1200 units/hr Monitor heparin  level in 6-8 hours and daily Continue to monitor H&H and platelets  Elspeth Sour, PharmD Clinical Pharmacist 07/26/2024 5:07 PM

## 2024-07-26 NOTE — Consult Note (Addendum)
 PHARMACY - ANTICOAGULATION CONSULT NOTE  Pharmacy Consult for heparin - holding warfarin Indication: atrial fibrillation  Allergies  Allergen Reactions   Atomoxetine Nausea And Vomiting   Azithromycin Hives   Lisinopril Nausea And Vomiting   Atorvastatin Other (See Comments)    Restless legs   Codeine Nausea Only    Patient Measurements: Height: 5' 4 (162.6 cm) Weight: 99.1 kg (218 lb 7.6 oz) IBW/kg (Calculated) : 54.7 HEPARIN  DW (KG): 76.7  Vital Signs: Temp: 97.8 F (36.6 C) (12/10 0804) Temp Source: Oral (12/10 0804) BP: 119/49 (12/10 0804) Pulse Rate: 56 (12/10 0804)  Labs: Recent Labs    07/25/24 1153 07/25/24 1521 07/26/24 0158  HGB  --  14.4 13.4  HCT  --  45.2 42.1  PLT  --  335 306  LABPROT  --  18.5* 19.0*  INR 1.7* 1.5* 1.5*  CREATININE  --  1.23* 1.15*    Estimated Creatinine Clearance: 52.8 mL/min (A) (by C-G formula based on SCr of 1.15 mg/dL (H)).   Medical History: Past Medical History:  Diagnosis Date   ADD (attention deficit disorder)    Diabetes mellitus without complication (HCC)    Hypertension     Medications:  Warfarin 5mg  every Tuesday and 2.5mg  all other days for the week (20mg /week) TTR 57.5%  Assessment: Patient presents to ED with chest pain, and shortness of breath. INR Subtherapeutic on admission. Pharmacy consulted to hold warfarin and dose heparin  for potential cardiac cath.  INR 1.5> 1.5  Goal of Therapy:  INR 2-3 Monitor platelets by anticoagulation protocol: Yes   Plan:  Hold warfarin Heparin  3000 units IV x 1 bolus Start heparin  infusion at 1200 units/hr Monitor heparin  level in 6-8 hours and daily Continue to monitor H&H and platelets  Elspeth Sour, PharmD Clinical Pharmacist 07/26/2024 8:25 AM

## 2024-07-26 NOTE — Progress Notes (Signed)
 Patient reports intermittent jaw pain rated 5-6/10. Terry Hurst, DO notified.  Stat EKG, troponins, and morphine  ordered.  Following administration of morphine , patient reports pain decreased to 2/10.

## 2024-07-26 NOTE — TOC CM/SW Note (Signed)
 Transition of Care [facility]) - Inpatient Brief Assessment   Patient Details  Name: Rachel Odonnell MRN: [id number] Date of Birth: 09-16-1955  Transition of Care [facility]) CM/SW Contact:    Lucie Lunger, LCSWA Phone Number: 07/26/2024, 9:12 AM   Clinical Narrative: Transition of Care Department [facility]) has reviewed patient and no TOC needs have been identified at this time. We will continue to monitor patient advancement through interdiciplinary progression rounds. If new patient transition needs arise, please place a TOC consult.  Transition of Care Asessment: Insurance and Status: Insurance coverage has been reviewed Patient has primary care physician: Yes Home environment has been reviewed: From home Prior level of function:: Independent Prior/Current Home Services: No current home services Social Drivers of Health Review: SDOH reviewed no interventions necessary Readmission risk has been reviewed: Yes Transition of care needs: no transition of care needs at this time

## 2024-07-26 NOTE — Consult Note (Addendum)
 Cardiology Consultation   Patient ID: [name] MRN: [id number]; DOB: 04-Jul-1956  Admit date: 07/25/2024 Date of Consult: 07/26/2024  PCP:  Jeanette Comer Rachel Odonnell   [name] Cardiologist:  Jayson Sierras, MD  Cardiology APP:  Miriam Norris, NP       Patient Profile: Rachel Odonnell is a 68 y.o. female with a hx of CAD (s/p DES to mid-LAD in 03/2023 and occlusion of diagonal jailed by stent and treated with POBA, s/p NSTEMI in 04/2024 with cath showing ISR of proximal LAD stent and underwent off-pump CABG on 05/06/2024 with LIMA-LAD), aortic stenosis, paroxysmal atrial fibrillation, HTN, HLD and OSA who is being seen 07/26/2024 for the evaluation of chest pain at the request of Dr. Ricky.  History of Present Illness:  Rachel Odonnell was at the [facility] office in [place] yesterday for an INR check and reported having jaw pain with associated shortness of breath which started the day prior to evaluation. Given her new symptoms and recent CABG, ED evaluation was recommended.   In talking with the patient today, she reports being in her normal state of health and was able to participate in cardiac rehab last week without any symptoms. Starting on Monday afternoon, she noticed bilateral jaw pain which occurred while walking around [place]. Reports this resembled her prior anginal symptoms when she had her prior stent and recent NSTEMI. Pain would improve with rest. She had recurrence yesterday which prompted her to ask about this while at the office. Reports her jaw pain has been occurring at rest or with activity and has improved some with the nitroglycerin  patch but she required Morphine  early this morning due to recurrent pain. Has noted some dyspnea on exertion. No specific orthopnea, PND or pitting edema.  Initial labs showed WBC 9.0, Hgb 14.4, platelets 335, Na+ 138, K+ 4.3 and creatinine 1.23. Initial and repeat Troponin T values have been negative at 17, 15, 16  and 18. proBNP normal at 235. CXR with no active disease. EKG shows sinus bradycardia with first-degree AV block, heart rate 56 with first-degree AV block and PAC's with TWI along lateral leads.    Past Medical History:  Diagnosis Date   ADD (attention deficit disorder)    CAD (coronary artery disease)    Diabetes mellitus without complication (HCC)    Hypertension    PAF (paroxysmal atrial fibrillation) (HCC)     Past Surgical History:  Procedure Laterality Date   ABDOMINAL HYSTERECTOMY     COLONOSCOPY  02/2022   CORONARY ARTERY BYPASS GRAFT N/A 05/06/2024   Procedure: OFF PUMP CORONARY ARTERY BYPASS GRAFTING (CABG) TIMES ONE USING LEFT INTERNAL MAMMARY ARTERY;  Surgeon: Shyrl Linnie KIDD, MD;  Location: [facility] OR;  Service: Open Heart Surgery;  Laterality: N/A;   CORONARY PRESSURE/FFR STUDY N/A 04/16/2023   Procedure: CORONARY PRESSURE/FFR STUDY;  Surgeon: Jordan, Peter M, MD;  Location: [facility] INVASIVE CV LAB;  Service: Cardiovascular;  Laterality: N/A;   CORONARY STENT INTERVENTION N/A 04/16/2023   Procedure: CORONARY STENT INTERVENTION;  Surgeon: Jordan, Peter M, MD;  Location: [facility] INVASIVE CV LAB;  Service: Cardiovascular;  Laterality: N/A;   CORONARY ULTRASOUND/IVUS N/A 04/16/2023   Procedure: Coronary Ultrasound/IVUS;  Surgeon: Jordan, Peter M, MD;  Location: [facility] INVASIVE CV LAB;  Service: Cardiovascular;  Laterality: N/A;   EXCISIONAL HEMORRHOIDECTOMY     INCONTINENCE SURGERY     INTRAOPERATIVE TRANSESOPHAGEAL ECHOCARDIOGRAM N/A 05/06/2024   Procedure: ECHOCARDIOGRAM, TRANSESOPHAGEAL, INTRAOPERATIVE;  Surgeon: Shyrl Linnie KIDD, MD;  Location:  [facility] OR;  Service: Open Heart Surgery;  Laterality: N/A;   LEFT HEART CATH AND CORONARY ANGIOGRAPHY N/A 05/04/2024   Procedure: LEFT HEART CATH AND CORONARY ANGIOGRAPHY;  Surgeon: Darron Deatrice LABOR, MD;  Location: [facility] INVASIVE CV LAB;  Service: Cardiovascular;  Laterality: N/A;   RIGHT/LEFT HEART CATH AND CORONARY ANGIOGRAPHY N/A 06/13/2021    Procedure: RIGHT/LEFT HEART CATH AND CORONARY ANGIOGRAPHY;  Surgeon: Jordan, Peter M, MD;  Location: [facility] INVASIVE CV LAB;  Service: Cardiovascular;  Laterality: N/A;   RIGHT/LEFT HEART CATH AND CORONARY ANGIOGRAPHY N/A 04/16/2023   Procedure: RIGHT/LEFT HEART CATH AND CORONARY ANGIOGRAPHY;  Surgeon: Jordan, Peter M, MD;  Location: [facility] INVASIVE CV LAB;  Service: Cardiovascular;  Laterality: N/A;     Home Medications:  Prior to Admission medications   Medication Sig Start Date End Date Taking? Authorizing Provider  aspirin  EC 325 MG tablet Take 1 tablet (325 mg total) by mouth daily. 05/13/24  Yes Barrett, Erin R, PA-C  busPIRone  (BUSPAR ) 5 MG tablet Take 5 mg by mouth at bedtime. 01/27/22  Yes [provider]  Cholecalciferol  (VITAMIN D3) 125 MCG (5000 UT) CAPS Take 1 capsule (5,000 Units total) by mouth daily. Patient taking differently: Take 5,000 Units by mouth at bedtime. 12/16/21  Yes Nida, Gebreselassie W, MD  citalopram  (CELEXA ) 20 MG tablet Take 1 tablet (20 mg total) by mouth every evening. 06/09/23  Yes Miriam Norris, NP  donepezil  (ARICEPT ) 5 MG tablet Take 5 mg by mouth every evening. 08/06/22  Yes [provider]  empagliflozin  (JARDIANCE ) 25 MG TABS tablet Take 1 tablet (25 mg total) by mouth daily. Patient taking differently: Take 25 mg by mouth at bedtime. 03/23/24  Yes Nida, Gebreselassie W, MD  estradiol  (ESTRACE ) 1 MG tablet Take 1 mg by mouth at bedtime. 02/07/24  Yes [provider]  fenofibrate  160 MG tablet Take 1 tablet (160 mg total) by mouth daily. Patient taking differently: Take 160 mg by mouth at bedtime. 05/13/24  Yes Barrett, Erin R, PA-C  gabapentin  (NEURONTIN ) 100 MG capsule Take 1 capsule (100 mg total) by mouth at bedtime. 04/17/23  Yes Dunn, Dayna N, PA-C  HUMALOG  KWIKPEN 100 UNIT/ML KwikPen INJECT SUBCUTANEOUSLY 16 TO 22  UNITS INTO THE SKIN 3 TIMES  DAILY BEFORE MEALS Patient taking differently: Inject 19-20 Units into the skin 3 (three)  times daily. If <150 BS, do not take 05/29/24  Yes Nida, Gebreselassie W, MD  insulin  degludec (TRESIBA  FLEXTOUCH) 200 UNIT/ML FlexTouch Pen Inject 110 Units into the skin at bedtime. 03/23/24  Yes Nida, Gebreselassie W, MD  levocetirizine (XYZAL ) 5 MG tablet Take 5 mg by mouth every evening.   Yes [provider]  levothyroxine  (SYNTHROID ) 50 MCG tablet TAKE 1 TABLET BY MOUTH DAILY  BEFORE BREAKFAST Patient taking differently: Take 50 mcg by mouth at bedtime. 11/11/23  Yes Nida, Gebreselassie W, MD  Magnesium  Oxide (MAGNESIUM  OXIDE 400) 240 MG PACK Take 400 mg by mouth daily. Patient taking differently: Take 400 mg by mouth at bedtime. 07/19/23  Yes Miriam Norris, NP  metFORMIN  (GLUCOPHAGE ) 500 MG tablet TAKE 1 TABLET BY MOUTH DAILY  WITH BREAKFAST Patient taking differently: Take 500 mg by mouth at bedtime. 11/11/23  Yes Nida, Gebreselassie W, MD  montelukast  (SINGULAIR ) 10 MG tablet Take 10 mg by mouth at bedtime.   Yes [provider]  OVER THE COUNTER MEDICATION Take 2 capsules by mouth at bedtime. Omega XL   Yes [provider]  pantoprazole  (PROTONIX ) 40 MG tablet TAKE  2 TABLETS EVERY DAY Patient taking differently: Take 80 mg by mouth at bedtime. 08/19/23  Yes Miriam Norris, NP  rOPINIRole  (REQUIP ) 2 MG tablet Take 2 mg by mouth at bedtime. 06/22/23  Yes [provider]  rosuvastatin  (CRESTOR ) 40 MG tablet Take 1 tablet (40 mg total) by mouth daily. Patient taking differently: Take 40 mg by mouth at bedtime. 04/17/23  Yes Dunn, Dayna N, PA-C  Semaglutide ,0.25 or 0.5MG /DOS, (OZEMPIC , 0.25 OR 0.5 MG/DOSE,) 2 MG/1.5ML SOPN Inject 0.5 mg into the skin every Thursday.   Yes [provider]  warfarin (COUMADIN ) 5 MG tablet TAKE ONE-HALF TABLET TO 1 TABLET BY MOUTH  DAILY OR AS DIRECTED Patient taking differently: Take 5 mg by mouth daily at 4 PM. Take 0.5 tablet on Sunday, Monday, Wednesday, Thursday, Friday, and Saturday daily and then take 1 tablet on  Tuesdays. 07/03/24  Yes Debera Jayson MATSU, MD  Accu-Chek Softclix Lancets lancets Use as instructed to test blood glucose four times daily 06/09/22   Nida, Gebreselassie W, MD  Continuous Glucose Receiver (FREESTYLE LIBRE 2 READER) DEVI USE TO CHECK GLUCOSE AS DIRECTED 12/04/22   Nida, Gebreselassie W, MD  Continuous Glucose Sensor (FREESTYLE LIBRE 2 SENSOR) MISC CHANGE SENSOR EVERY 14 DAYS USE  TO CHECK BLOOD GLUCOSE  CONTINUOUSLY 11/01/23   Lenis Ethelle ORN, MD  glucose blood test strip 1 each by Other route as needed. Use as instructed to test blood glucose four times daily 06/09/22   Nida, Gebreselassie W, MD  Insulin  Pen Needle (B-D ULTRAFINE III SHORT PEN) 31G X 8 MM MISC 1 each by Does not apply route as directed. 01/06/22   Nida, Gebreselassie W, MD  modafinil  (PROVIGIL ) 100 MG tablet Take 1 tablet (100 mg total) by mouth daily. Patient not taking: Reported on 07/25/2024 03/02/24   Jude Harden GAILS, MD  Vibegron  (GEMTESA ) 75 MG TABS Take 1 tablet (75 mg total) by mouth daily. 02/01/24   Larocco, [name] C, FNP    Scheduled Meds:  aspirin  EC  325 mg Oral Daily   busPIRone   5 mg Oral QHS   citalopram   20 mg Oral QPM   fenofibrate   160 mg Oral QHS   gabapentin   100 mg Oral QHS   insulin  aspart  0-5 Units Subcutaneous QHS   insulin  aspart  0-9 Units Subcutaneous TID WC   insulin  glargine  3 Units Subcutaneous BID   isosorbide  mononitrate  30 mg Oral Daily   levothyroxine   50 mcg Oral QAC breakfast   montelukast   10 mg Oral QHS   pantoprazole   80 mg Oral QHS   rOPINIRole   2 mg Oral QHS   rosuvastatin   40 mg Oral QPM   Warfarin - Pharmacist Dosing Inpatient   Does not apply q1600   Continuous Infusions:  PRN Meds: acetaminophen , melatonin, polyethylene glycol, prochlorperazine   Allergies:    Allergies  Allergen Reactions   Atomoxetine Nausea And Vomiting   Azithromycin Hives   Lisinopril Nausea And Vomiting   Atorvastatin Other (See Comments)    Restless legs   Codeine Nausea  Only    Social History:   Social History   Socioeconomic History   Marital status: Married    Spouse name: Meribeth   Number of children: Not on file   Years of education: Not on file   Highest education level: Not on file  Occupational History   Not on file  Tobacco Use   Smoking status: Former    Current packs/day: 0.00  Types: Cigarettes    Quit date: 08/17/1994    Years since quitting: 29.9   Smokeless tobacco: Never  Vaping Use   Vaping status: Never Used  Substance and Sexual Activity   Alcohol  use: No   Drug use: No   Sexual activity: Yes  Other Topics Concern   Not on file  Social History Narrative   Lives at home with husband, Meribeth   Former smoker Smoked 2 ppd x 25 yrs; Quit in 1996   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   All other ROS reviewed and negative.     Physical Exam/Data: Vitals:   07/25/24 2015 07/26/24 0021 07/26/24 0437 07/26/24 0804  BP: 120/67 122/62 (!) 105/44 (!) 119/49  Pulse: (!) 59 (!) 57 (!) 55 (!) 56  Resp: 16 18 18 20   Temp: (!) 97.5 F (36.4 C) 97.7 F (36.5 C) 97.9 F (36.6 C) 97.8 F (36.6 C)  TempSrc: Oral Oral Oral Oral  SpO2: 97% 92% 93% 93%  Weight:      Height:        Intake/Output Summary (Last 24 hours) at 07/26/2024 0909 Last data filed at 07/26/2024 0520 Gross per 24 hour  Intake 480 ml  Output --  Net 480 ml      07/25/2024    8:03 PM 07/25/2024    3:18 PM 06/28/2024    3:22 PM  Last 3 Weights  Weight (lbs) 218 lb 7.6 oz 211 lb 10.3 oz 213 lb 3 oz   Weight (kg) 99.1 kg 96 kg 96.7 kg     Body mass index is 37.5 kg/m.  General:  Well nourished, well developed female appearing in no acute distress HEENT: normal Neck: no JVD Vascular: No carotid bruits; Distal pulses 2+ bilaterally Cardiac:  normal S1, S2; RRR; 2/6 SEM along RUSB.  Lungs:  clear to auscultation bilaterally, no wheezing, rhonchi or rales  Abd: soft, nontender, no hepatomegaly  Ext: no pitting edema Musculoskeletal:  No deformities, BUE and BLE strength normal and equal Skin: warm and dry  Neuro:  CNs 2-12 intact, no focal abnormalities noted Psych:  Normal affect   EKG:  The EKG was personally reviewed and demonstrates: Sinus bradycardia with first-degree AV block, heart rate 56 with first-degree AV block and PAC's with TWI along lateral leads.   Telemetry:  Telemetry was personally reviewed and demonstrates: Sinus bradycardia, HR in 50's to 60's with occasional PVC's.   Relevant CV Studies:  Cardiac Catheterization: 04/2024   Prox RCA to Mid RCA lesion is 35% stenosed.   1st Mrg lesion is 60% stenosed.   Prox LAD lesion is 95% stenosed.   1st Diag lesion is 60% stenosed.   1.  Severe one-vessel coronary artery disease due to in-stent restenosis in the proximal portion of the LAD stent that was placed last year.  Stable left circumflex disease. 2.  Left ventricular angiography was not performed.  EF  was normal by echo. 3.  Mild gradient across the aortic valve. 4.  Severe right radial artery spasm.   Recommendations: The patient has very aggressive restenosis in the proximal LAD.  Management options include CABG versus drug-coated balloon angioplasty.  Will discuss during the heart meeting tomorrow.  If PCI is pursued, it will have to be done via the femoral approach. Resume heparin  drip 2 hours after TR band removal. I held clopidogrel  for now in case CABG is needed.  Echocardiogram: 04/2024 IMPRESSIONS     1. Left ventricular ejection fraction, by  estimation, is 70 to 75%. The  left ventricle has hyperdynamic function. The left ventricle has no  regional wall motion abnormalities. There is mild left ventricular  hypertrophy. Left ventricular diastolic  parameters were normal.   2. Right ventricular systolic function is normal. The right ventricular  size is normal. Tricuspid regurgitation signal is inadequate for assessing  PA pressure.   3. The mitral valve is normal in structure. Trivial mitral valve  regurgitation. No evidence of mitral stenosis.   4. The aortic valve is tricuspid. There is mild calcification of the  aortic valve. There is mild thickening of the aortic valve. Aortic valve  regurgitation is not visualized. Mild aortic valve stenosis. Aortic valve  area, by VTI measures 1.65 cm.  Aortic valve mean gradient measures 14.0 mmHg.   5. The inferior vena cava is normal in size with greater than 50%  respiratory variability, suggesting right atrial pressure of 3 mmHg.    Laboratory Data: High Sensitivity Troponin:  No results for input(s): TROPONINIHS in the last 720 hours.   Chemistry Recent Labs  Lab 07/21/24 0744 07/25/24 1521 07/26/24 0158  NA  --  138 138  K  --  4.3 4.7  CL  --  102 101  CO2  --  23 31  GLUCOSE  --  144* 163*  BUN  --  20 19  CREATININE  --  1.23* 1.15*  CALCIUM  10.4 10.6* 9.7  MG  --   --  2.6*  GFRNONAA  --  47* 51*  ANIONGAP  --  14 6    Recent Labs  Lab 07/21/24 0744  ALBUMIN  4.3   Lipids No results for input(s): CHOL, TRIG, HDL, LABVLDL, LDLCALC, CHOLHDL in the last 168 hours.  Hematology Recent Labs  Lab 07/25/24 1521 07/26/24 0158  WBC 9.0 9.1  RBC 4.99 4.61  HGB 14.4 13.4  HCT 45.2 42.1  MCV 90.6 91.3  MCH 28.9 29.1  MCHC 31.9 31.8  RDW 13.6 13.5  PLT 335 306   Thyroid  No results for input(s): TSH, FREET4 in the last 168 hours.  BNP Recent Labs  Lab 07/25/24 1521  PROBNP 235.0    DDimer No results for input(s): DDIMER in the last  168 hours.  Radiology/Studies:  DG Chest Port 1 View Result Date: 07/25/2024 CLINICAL DATA:  Chest pain EXAM: PORTABLE CHEST 1 VIEW COMPARISON:  Prior chest x-ray 06/23/2024 FINDINGS: Patient is status post median sternotomy. Cardiac and mediastinal contours are unchanged. Lungs are clear save for mild chronic bronchitic changes. No overt edema, pleural effusion, pneumothorax or focal airspace infiltrate. No acute osseous abnormality. IMPRESSION: No active disease. Electronically Signed   By: Wilkie Lent M.D.   On: 07/25/2024 16:19     Assessment and Plan:  1. Jaw-pain concerning for Accelerating Angina - Presents with a 2-day history of worsening jaw pain which occurs with exertion and resembles her prior angina. Was previously exercising  last week with cardiac rehab without any symptoms. Troponin values have been negative and EKG does show TWI along the lateral leads. - An echocardiogram is scheduled for today and will ask our echocardiogram technician to try to obtain this earlier today. Symptoms could possibly be due to small vessel disease and will have her nitroglycerin  patch removed and restart Imdur  at a lower dose of 30 mg daily and titrate as needed (previously on 90 mg daily prior to CABG in 04/2024). If her echocardiogram is reassuring and symptoms improve with Imdur , would not anticipate further ischemic evaluation. If echocardiogram shows new abnormalities or she continues to have episodes of chest pain, would need to plan for a relook cardiac catheterization to assess for any interval changes. Will start IV Heparin  and hold Coumadin . Continue ASA and statin therapy. No beta-blocker due to bradycardia.  2. CAD - She previously underwent stenting to the LAD in 03/2023 and had occlusion of a diagonal jailed by stent and this was treated with POBA. She recently had an NSTEMI in 04/2024 and underwent off-pump CABG with LIMA to LAD. - As above, may require repeat ischemic evaluation  this admission. - Continue ASA 325 mg daily and Crestor  40 mg daily. She has not been on AV nodal blocking agents given bradycardia. Will start Imdur  30 mg daily. Assume that she was not placed on Plavix  following recent CABG in the setting of an NSTEMI given the concurrent use of Coumadin .  3. Paroxysmal Atrial Fibrillation - Currently in normal sinus rhythm and she has not required the use of AV nodal blocking agents.  - She is on Coumadin  for anticoagulation and INR is subtherapeutic at 1.5 today. Given the need for possible cardiac catheterization, will hold Coumadin  for now and bridge with IV Heparin . Pharmacy consult placed.  4. Aortic Stenosis - She has a prominent murmur on examination and this was mild by echocardiogram in 04/2024. Repeat imaging pending.  5. HTN - BP has overall been well-controlled with occasional episodes of hypotension noted. Will add Imdur  30 mg daily (previously on 90 mg daily and tolerated well).  6. HLD - FLP earlier this month showed LDL at 70. Continue Crestor  40 mg daily and Fenofibrate  160 mg daily. If LDL remains above goal, would plan to add Zetia. She was previously intolerant to Atorvastatin.   Risk Assessment/Risk Scores: CHA2DS2-VASc Score = 5  This indicates a 7.2% annual risk of stroke. The patient's score is based upon: CHF History: 0 HTN History: 1 Diabetes History: 1 Stroke History: 0 Vascular Disease History: 1 Age Score: 1 Gender Score: 1    For questions or updates, please contact [facility] Please consult [email or web address] for contact info under   Signed, Laymon CHRISTELLA Qua, PA-C  07/26/2024 9:09 AM   Attending Note  Patient seen and discussed with PA Qua, I agree with her documentation. 68 yo female history of CAD with prior DES 03/2023 to mid LAD complicated by jailing of diagnonal that was treated with POBA, HLD, HTN, PAF, OSA, mild aortic stenosis,  Admit NSTEMI 04/2024, cath findings as reported below.  Received CABG with LIMA-LAD.   Presents with chest pain. Had overall do well nearly 3 months out from her CABG. On Monday evening while walking at [facility] developed sudden onset of SOB and bilateral jaw pain. 6-7/10 in severity. Walked out to her car, family drove her home. Pain lasted roughly 2-3 hours and then resolved. Repeat episode Tuesday while walking into her coumadin  clinic appointment, also lasted  2-3 hours. Decided to present to ER for evaluation. Pain is exactly similar to her angina last year prior to stent and her angina 04/2024 when presented with NSTEMI.    INR 1.7 K 4.3 Cr 1.23 BUN 20 WBC 9 Hgb 14.4 Plt 335 proBNP 235  Trop 17-->15-->16-->18 EKG sinus brady, lateral TWIs CXR: no acute process  04/2024 cath:  LM NL, LAD severe ISR 95%, D1 60%, OM1 60%, prox RCA to mid RCA 35%.  04/2024 echo: LVE 70-75%< no WMAs, normal diasotlic function, mild AS mean grad 14, AVA VTI 1.65    1.CAD/Chest pain - prior DES 03/2023 to mid LAD complicated by jailing of diagnonal that was treated with POBA - Admit NSTEMI 04/2024, cath findings as reported above. Received CABG with LIMA-LAD.  -trops negative, EKG lateral TWIs - repeat echo pending - SOB and jaw pain exactly similar to her presentation in 03/2023 when she received a stent and 04/2024 when presented with NSTEMI and had CABG LIMA-LAD.  - f/u echo, decide based on findings today possible noninvasive vs invasive ischemic testing.   - after NSTEMI and CABG 04/2024 was discharged on ASA 325, coumadin  per CT surgery. Review long term antiplatelet/anticoag plan. After her prior stent was on triple therapy  then plavix  and coumadin .   2.Permanent afib - hold coumadin  for now in case invasive testing is indicated  Dorn Ross MD

## 2024-07-26 NOTE — Plan of Care (Signed)
   Problem: Education: Goal: Knowledge of General Education information will improve Description Including pain rating scale, medication(s)/side effects and non-pharmacologic comfort measures Outcome: Progressing   Problem: Education: Goal: Knowledge of General Education information will improve Description Including pain rating scale, medication(s)/side effects and non-pharmacologic comfort measures Outcome: Progressing

## 2024-07-26 NOTE — Progress Notes (Signed)
° °  Brief Progress Note   _____________________________________________________________________________________________________________  Patient Name: Rachel Odonnell Patient DOB: Feb 17, 1956 Date: @TODAY @      Data: Noted 2D ECHO needing completion prior to discharge when medically ready.    Action: Phone call to ECHO lab at [facility]    Response:  Message left to please expedite 2D ECHO if at all possible as patient potential discharge candidate for today once cleared by cardiology.  _____________________________________________________________________________________________________________  [facility] RN Expeditor Marena Witts, Corean Lobstein Please contact us  directly via secure chat (search for [facility]) or by calling us  at [phone] [facility]).

## 2024-07-26 NOTE — Progress Notes (Signed)
 Echocardiogram 2D Echocardiogram has been performed.  Juliene JINNY Rucks 07/26/2024, 5:27 PM

## 2024-07-26 NOTE — Care Management Obs Status (Signed)
 MEDICARE OBSERVATION STATUS NOTIFICATION   Patient Details  Name: Rachel Odonnell MRN: [id number] Date of Birth: May 10, 1956   Medicare Observation Status Notification Given:  Yes  Patient now getting Heprin, does not agree with OBS status.  Copy given, team updated.   Sharlyne Stabs, RN 07/26/2024, 4:26 PM

## 2024-07-26 NOTE — Progress Notes (Signed)
 PROGRESS NOTE    Rachel Odonnell  FMW:[id number] DOB: 1956/04/26 DOA: 07/25/2024 PCP: Jeanette Comer BRAVO, PA-C    Chief Complaint  Patient presents with   Chest Pain   Shortness of Breath    Brief Narrative:  As per H&P written by Dr. Shona on 07/25/2024 Rachel Odonnell is a 68 y.o. female with medical history significant for CAD s/p PCI with stent placement, occluded stent s/p one-vessel bypass (September 2025), permanent A-fib on Coumadin , DM2, HTN, HLD, Hypothyroidism, chronic anxiety/depression, diabetic polyneuropathy, GERD, obesity, who presents to the ER with complaints of bilateral jaw pain and exertional shortness of breath.  States she was walking and doing her grocery shopping at [facility] yesterday when she suddenly developed bilateral jaw pain.  It was improved with resting.  Symptoms are similar to previous symptoms that led to CABG.  She endorses that her atypical cardiac related symptoms have always presented as bilateral jaw pain and not so much as chest pain.     She went to coumadin  clinic this morning and let them know about these symptoms she had experienced.  They communicated with her cardiologist, and was given a phone call and advised to come to the ER.   In the ER, hypertensive.  First 12 lead EKG showed no evidence of acute ischemia.  High sensitive troponin negative x 2.  Her bilateral jaw pain improved in the ER after nitroglycerin  patch, from 5-6/10 to 2/10 in severity.     EDP discussed the case with cardiology who recommended to trend troponin.  Admitted by [facility], Hospitalist service, for chest pain rule out ACS.   ED Course: Temp 98.  BP 151/75, HR 69, RR 15, O2 sat 96%.  Assessment & Plan: 1-chest pain: - Heart score of 5 - Negative troponins and no acute ischemic changes on EKG or telemetry - Patient with some typical presentation along with atypical features.- -After discussing with cardiology service decision made to check 2D echo and to hold Coumadin  in  case intervention needed. - Planning to decide regarding a stress test versus cardiac cath on 07/27/2024. - N.p.o. after midnight. - Continue aspirin , Imdur , statin and as needed nitroglycerin .  2-history of permanent atrial fibrillation - Currently rate controlled - Holding Coumadin  in the setting of possible ischemic workup - No requiring rate control agents at the moment. - Continue telemetry monitoring.  3-hyperlipidemia - Continue statin and fenofibrate .  4-hypertension - Continue current antihypertensive agents. - Vital signs stable for the most part.  5-history of hypothyroidism - Continue Synthroid .  6-type 2 diabetes mellitus - Continue to hold oral hypoglycemic agents while inpatient - Continue sliding scale insulin  and the use of Lantus  - Follow CBG fluctuation.  7-GERD - Continue PPI  8-history of restless leg syndrome - Continue Requip     DVT prophylaxis: Patient was receiving Coumadin ; per cardiology on-hold in case patient will need cardiac cath. Code Status: Full code. Family Communication: Husband at bedside. Disposition:   Status is: Observation The patient remains OBS appropriate and will d/c before 2 midnights.   Consultants:  Cardiology service  Procedures: See below for x-ray reports.  Antimicrobials: None   Subjective: At time of examination chest pain-free and reporting no acute distress.  Patient is present no nausea/no vomiting and good saturation on room air.  Objective: Vitals:   07/25/24 2015 07/26/24 0021 07/26/24 0437 07/26/24 0804  BP: 120/67 122/62 (!) 105/44 (!) 119/49  Pulse: (!) 59 (!) 57 (!) 55 (!) 56  Resp: 16 18 18  20  Temp: (!) 97.5 F (36.4 C) 97.7 F (36.5 C) 97.9 F (36.6 C) 97.8 F (36.6 C)  TempSrc: Oral Oral Oral Oral  SpO2: 97% 92% 93% 93%  Weight:      Height:        Intake/Output Summary (Last 24 hours) at 07/26/2024 1758 Last data filed at 07/26/2024 1731 Gross per 24 hour  Intake 1033.39 ml   Output --  Net 1033.39 ml   Filed Weights   07/25/24 1518 07/25/24 2003  Weight: 96 kg 99.1 kg    Examination:  General exam: Appears calm and comfortable  Respiratory system: Clear to auscultation. Respiratory effort normal.  Good saturation on room air. Cardiovascular system: S1 & S2 heard, rate controlled.  No JVD, no rubs or gallops; positive murmur on exam. No pedal edema. Gastrointestinal system: Abdomen is nondistended, soft and nontender. No organomegaly or masses felt. Normal bowel sounds heard. Central nervous system: Alert and oriented. No focal neurological deficits. Extremities: No cyanosis or clubbing. Skin: No rashes, no petechiae. Psychiatry: Judgement and insight appear normal. Mood & affect appropriate.     Data Reviewed: I have personally reviewed following labs and imaging studies  CBC: Recent Labs  Lab 07/25/24 1521 07/26/24 0158  WBC 9.0 9.1  HGB 14.4 13.4  HCT 45.2 42.1  MCV 90.6 91.3  PLT 335 306    Basic Metabolic Panel: Recent Labs  Lab 07/21/24 0744 07/25/24 1521 07/26/24 0158  NA  --  138 138  K  --  4.3 4.7  CL  --  102 101  CO2  --  23 31  GLUCOSE  --  144* 163*  BUN  --  20 19  CREATININE  --  1.23* 1.15*  CALCIUM  10.4 10.6* 9.7  MG  --   --  2.6*  PHOS  --   --  3.8    GFR: Estimated Creatinine Clearance: 52.8 mL/min (A) (by C-G formula based on SCr of 1.15 mg/dL (H)).  Liver Function Tests: Recent Labs  Lab 07/21/24 0744  ALBUMIN  4.3    CBG: Recent Labs  Lab 07/25/24 2015 07/26/24 0438 07/26/24 0752 07/26/24 1116 07/26/24 1608  GLUCAP 133* 133* 181* 171* 153*     No results found for this or any previous visit (from the past 240 hours).   Radiology Studies: DG Chest Port 1 View Result Date: 07/25/2024 CLINICAL DATA:  Chest pain EXAM: PORTABLE CHEST 1 VIEW COMPARISON:  Prior chest x-ray 06/23/2024 FINDINGS: Patient is status post median sternotomy. Cardiac and mediastinal contours are unchanged. Lungs are  clear save for mild chronic bronchitic changes. No overt edema, pleural effusion, pneumothorax or focal airspace infiltrate. No acute osseous abnormality. IMPRESSION: No active disease. Electronically Signed   By: Wilkie Lent M.D.   On: 07/25/2024 16:19   Scheduled Meds:  aspirin  EC  325 mg Oral Daily   busPIRone   5 mg Oral QHS   citalopram   20 mg Oral QPM   fenofibrate   160 mg Oral QHS   gabapentin   100 mg Oral QHS   insulin  aspart  0-5 Units Subcutaneous QHS   insulin  aspart  0-9 Units Subcutaneous TID WC   insulin  glargine  3 Units Subcutaneous BID   isosorbide  mononitrate  30 mg Oral Daily   levothyroxine   50 mcg Oral QAC breakfast   montelukast   10 mg Oral QHS   pantoprazole   80 mg Oral QHS   rOPINIRole   2 mg Oral QHS   rosuvastatin   40 mg  Oral QPM   Continuous Infusions:  heparin  1,200 Units/hr (07/26/24 1115)     LOS: 0 days    Time spent: 35 minutes    Eric Nunnery, MD [facility]   To contact the attending provider between 7A-7P or the covering provider during after hours 7P-7A, please log into the web site [email or web address] and access using universal [place] password for that web site. If you do not have the password, please call the hospital operator.  07/26/2024, 5:58 PM

## 2024-07-26 NOTE — Progress Notes (Signed)
 Nurse at bedside, patient started on Heparin  drip to right antecubital at 12 ml/hr, per MD's orders,verified by two nurses, Stacy RN at bedside..Plan of care on going.

## 2024-07-26 NOTE — Progress Notes (Signed)
EKG performed on patient

## 2024-07-27 ENCOUNTER — Encounter (HOSPITAL_COMMUNITY)

## 2024-07-27 DIAGNOSIS — I2510 Atherosclerotic heart disease of native coronary artery without angina pectoris: Secondary | ICD-10-CM | POA: Diagnosis not present

## 2024-07-27 DIAGNOSIS — R079 Chest pain, unspecified: Secondary | ICD-10-CM | POA: Diagnosis not present

## 2024-07-27 DIAGNOSIS — I480 Paroxysmal atrial fibrillation: Secondary | ICD-10-CM | POA: Diagnosis not present

## 2024-07-27 DIAGNOSIS — I350 Nonrheumatic aortic (valve) stenosis: Secondary | ICD-10-CM | POA: Diagnosis not present

## 2024-07-27 DIAGNOSIS — R0789 Other chest pain: Secondary | ICD-10-CM | POA: Diagnosis not present

## 2024-07-27 LAB — GLUCOSE, CAPILLARY
Glucose-Capillary: 155 mg/dL — ABNORMAL HIGH (ref 70–99)
Glucose-Capillary: 213 mg/dL — ABNORMAL HIGH (ref 70–99)
Glucose-Capillary: 225 mg/dL — ABNORMAL HIGH (ref 70–99)
Glucose-Capillary: 232 mg/dL — ABNORMAL HIGH (ref 70–99)

## 2024-07-27 LAB — HEPARIN LEVEL (UNFRACTIONATED)
Heparin Unfractionated: <0.1 [IU]/mL — ABNORMAL LOW (ref 0.30–0.70)
Heparin Unfractionated: 0.38 [IU]/mL (ref 0.30–0.70)

## 2024-07-27 LAB — CBC
HCT: 43.6 % (ref 36.0–46.0)
Hemoglobin: 13.9 g/dL (ref 12.0–15.0)
MCH: 29.4 pg (ref 26.0–34.0)
MCHC: 31.9 g/dL (ref 30.0–36.0)
MCV: 92.4 fL (ref 80.0–100.0)
Platelets: 296 K/uL (ref 150–400)
RBC: 4.72 MIL/uL (ref 3.87–5.11)
RDW: 13.4 % (ref 11.5–15.5)
WBC: 7.7 K/uL (ref 4.0–10.5)
nRBC: 0 % (ref 0.0–0.2)

## 2024-07-27 LAB — PROTIME-INR
INR: 1.9 — ABNORMAL HIGH (ref 0.8–1.2)
Prothrombin Time: 22.7 s — ABNORMAL HIGH (ref 11.4–15.2)

## 2024-07-27 MED ORDER — ISOSORBIDE MONONITRATE ER 60 MG PO TB24: 60.0000 mg | ORAL_TABLET | Freq: Every day | ORAL | Status: DC

## 2024-07-27 MED ORDER — ISOSORBIDE MONONITRATE ER 30 MG PO TB24
30.0000 mg | ORAL_TABLET | Freq: Once | ORAL | Status: AC
  Administered 2024-07-27: 30 mg via ORAL
  Filled 2024-07-27: qty 1

## 2024-07-27 MED ORDER — ISOSORBIDE MONONITRATE ER 60 MG PO TB24
90.0000 mg | ORAL_TABLET | Freq: Every day | ORAL | Status: DC
  Administered 2024-07-28 – 2024-07-29 (×2): 90 mg via ORAL
  Filled 2024-07-27 (×2): qty 1

## 2024-07-27 MED ORDER — HEPARIN BOLUS VIA INFUSION
2000.0000 [IU] | Freq: Once | INTRAVENOUS | Status: AC
  Administered 2024-07-27: 2000 [IU] via INTRAVENOUS
  Filled 2024-07-27: qty 2000

## 2024-07-27 NOTE — Progress Notes (Addendum)
 Rounding Note   Patient Name: Rachel Odonnell Date of Encounter: 07/27/2024  [facility] Cardiologist: Jayson Sierras, MD   Subjective  She reports having at least 3 episodes of jaw pain since yesterday which have spontaneously resolved. An episode yesterday afternoon occurred while walking around her room and the other two occurred while in the bed. Breathing has been stable. No palpitations.   Scheduled Meds:  aspirin  EC  325 mg Oral Daily   busPIRone   5 mg Oral QHS   citalopram   20 mg Oral QPM   fenofibrate   160 mg Oral QHS   gabapentin   100 mg Oral QHS   insulin  aspart  0-5 Units Subcutaneous QHS   insulin  aspart  0-9 Units Subcutaneous TID WC   insulin  glargine  3 Units Subcutaneous BID   isosorbide  mononitrate  30 mg Oral Daily   levothyroxine   50 mcg Oral QAC breakfast   montelukast   10 mg Oral QHS   pantoprazole   80 mg Oral QHS   rOPINIRole   2 mg Oral QHS   rosuvastatin   40 mg Oral QPM   Continuous Infusions:  heparin  1,350 Units/hr (07/27/24 0616)   PRN Meds: acetaminophen , melatonin, polyethylene glycol, prochlorperazine    Vital Signs  Vitals:   07/26/24 0021 07/26/24 0437 07/26/24 0804 07/26/24 1900  BP: 122/62 (!) 105/44 (!) 119/49 (!) 109/52  Pulse: (!) 57 (!) 55 (!) 56 60  Resp: 18 18 20    Temp: 97.7 F (36.5 C) 97.9 F (36.6 C) 97.8 F (36.6 C) 98.4 F (36.9 C)  TempSrc: Oral Oral Oral Oral  SpO2: 92% 93% 93% 96%  Weight:      Height:        Intake/Output Summary (Last 24 hours) at 07/27/2024 0855 Last data filed at 07/26/2024 1731 Gross per 24 hour  Intake 553.39 ml  Output --  Net 553.39 ml      07/25/2024    8:03 PM 07/25/2024    3:18 PM 06/28/2024    3:22 PM  Last 3 Weights  Weight (lbs) 218 lb 7.6 oz 211 lb 10.3 oz 213 lb 3 oz  Weight (kg) 99.1 kg 96 kg 96.7 kg      Telemetry  NSR with episodes of sinus bradycardia, HR in 50's with occasional PVC's.  - Personally Reviewed  ECG  No new tracings.   Physical  Exam  GEN: Appears in no acute distress.   Neck: No JVD Cardiac: RRR, no murmurs, rubs, or gallops.  Respiratory: Clear to auscultation bilaterally. GI: Soft, nontender, non-distended  MS: No pitting edema; No deformity. Neuro:  Nonfocal  Psych: Normal affect   Labs High Sensitivity Troponin:  No results for input(s): TROPONINIHS in the last 720 hours.   Chemistry Recent Labs  Lab 07/21/24 0744 07/25/24 1521 07/26/24 0158  NA  --  138 138  K  --  4.3 4.7  CL  --  102 101  CO2  --  23 31  GLUCOSE  --  144* 163*  BUN  --  20 19  CREATININE  --  1.23* 1.15*  CALCIUM  10.4 10.6* 9.7  MG  --   --  2.6*  ALBUMIN  4.3  --   --   GFRNONAA  --  47* 51*  ANIONGAP  --  14 6    Lipids No results for input(s): CHOL, TRIG, HDL, LABVLDL, LDLCALC, CHOLHDL in the last 168 hours.  Hematology Recent Labs  Lab 07/25/24 1521 07/26/24 0158 07/27/24 0748  WBC 9.0 9.1  7.7  RBC 4.99 4.61 4.72  HGB 14.4 13.4 13.9  HCT 45.2 42.1 43.6  MCV 90.6 91.3 92.4  MCH 28.9 29.1 29.4  MCHC 31.9 31.8 31.9  RDW 13.6 13.5 13.4  PLT 335 306 296   Thyroid  No results for input(s): TSH, FREET4 in the last 168 hours.  BNP Recent Labs  Lab 07/25/24 1521  PROBNP 235.0    DDimer No results for input(s): DDIMER in the last 168 hours.   Radiology   DG Chest Port 1 View Result Date: 07/25/2024 CLINICAL DATA:  Chest pain EXAM: PORTABLE CHEST 1 VIEW COMPARISON:  Prior chest x-ray 06/23/2024 FINDINGS: Patient is status post median sternotomy. Cardiac and mediastinal contours are unchanged. Lungs are clear save for mild chronic bronchitic changes. No overt edema, pleural effusion, pneumothorax or focal airspace infiltrate. No acute osseous abnormality. IMPRESSION: No active disease. Electronically Signed   By: Wilkie Lent M.D.   On: 07/25/2024 16:19    Cardiac Studies  Echocardiogram: 07/26/2024 IMPRESSIONS     1. Very distal apical hypokinesis noted with use of echocontrast.  Left  ventricular ejection fraction, by estimation, is 60 to 65%. The left  ventricle has normal function. The left ventricle has no regional wall  motion abnormalities. There is mild left  ventricular hypertrophy. Left ventricular diastolic parameters are  consistent with Grade I diastolic dysfunction (impaired relaxation).   2. Right ventricular systolic function is normal. The right ventricular  size is normal.   3. The mitral valve is normal in structure. No evidence of mitral valve  regurgitation. No evidence of mitral stenosis.   4. The aortic valve is tricuspid. There is severe calcifcation of the  aortic valve. There is severe thickening of the aortic valve. Aortic valve  regurgitation is not visualized. No aortic stenosis is present.   5. The inferior vena cava is normal in size with greater than 50%  respiratory variability, suggesting right atrial pressure of 3 mmHg.    Patient Profile   68 y.o. female w/ PMH of CAD (s/p DES to mid-LAD in 03/2023 and occlusion of diagonal jailed by stent and treated with POBA, s/p NSTEMI in 04/2024 with cath showing ISR of proximal LAD stent and underwent off-pump CABG on 05/06/2024 with LIMA-LAD), aortic stenosis, paroxysmal atrial fibrillation, HTN, HLD and OSA who is currently admitted for evaluation of jaw pain.  Assessment & Plan   1. Jaw-pain concerning for Accelerating Angina - Presented with worsening jaw pain with exertion over the past 2 days which was her prior anginal symptom. Troponin values were negative but EKG did show TWI along the lateral leads. Repeat echocardiogram shows a preserved EF of 60 to 65% and she does have very distal apical hypokinesis. - Discussion today with the patient and her husband who is at the bedside. She does not wish to undergo a stress test as she reports feeling like she was going to die with Lexiscan  in the past.  Reviewed options for a cardiac catheterization but they prefer to hold off on invasive  evaluation and continue to try medical therapy initially. Therefore, will further titrate Imdur  from 30 mg daily to 60 mg daily. If she continues to have episodes of jaw-pain resembling her angina, would plan for cardiac catheterization tomorrow. Continue IV Heparin  for now along with ASA and Crestor . No beta-blocker due to bradycardia.  2. CAD - She recently underwent CABG with LIMA to LAD in 04/2024. Remains on ASA 325 mg daily and Crestor  40 mg daily. Can  possibly reduce ASA to 81 mg daily since on Coumadin  as well.  3. Paroxysmal Atrial Fibrillation - She has been maintaining normal sinus rhythm this admission and has not required the use of AV nodal blocking agents. - She was on Coumadin  prior to admission and INR was at 1.5 when checked on 07/26/2024.  Coumadin  has been held and she is being bridged with IV Heparin  in case she requires cardiac catheterization.  4. AS - There was no evidence of stenosis by repeat echocardiogram this admission.  5. HTN - BP has overall been well-controlled, at 109/52 on most recent check. She has been started on Imdur  30 mg daily as discussed above.  6. HLD - LDL was at 70 when checked earlier this month. She remains on Crestor  40 mg daily and Fenofibrate  160 mg daily. Would recheck an FLP in 2 months and if LDL remains above goal, plan to add Zetia.  For questions or updates, please contact [facility] Please consult [email or web address] for contact info under      Signed, Laymon CHRISTELLA Qua, PA-C  07/27/2024, 8:55 AM    Attending Note  Patient seen and discussed with PA Qua, I agree with her documentation  1.CAD/Chest pain - prior DES 03/2023 to mid LAD complicated by jailing of diagnonal that was treated with POBA - Admit NSTEMI 04/2024, cath findings as reported above. Received CABG with LIMA-LAD.  -trops negative, EKG lateral TWIs - Echo LVEF 60-65%, no WMAs - SOB and jaw pain exactly similar to her presentation in 03/2023 when she  received a stent and 04/2024 when presented with NSTEMI and had CABG LIMA-LAD.   - after NSTEMI and CABG 04/2024 was discharged on ASA 325, coumadin  per CT surgery. Review long term antiplatelet/anticoag plan. After her prior stent was on triple therapy  then plavix  and coumadin .   -high concern for unstable angina, she agrees for cath. Plan for when INR allows, would go ahead and arrange transfer today. Arrange cath tomorrow, may be postponed however if INR not a level.    2.Permanent afib - holding coumadin  for  invasive testing, on hep gtt   Dorn Ross MD

## 2024-07-27 NOTE — Progress Notes (Signed)
 PROGRESS NOTE    Rachel Odonnell  FMW:[id number] DOB: [birth year] DOA: 07/25/2024 PCP: Jeanette Comer BRAVO, PA-C    Chief Complaint  Patient presents with   Chest Pain   Shortness of Breath    Brief Narrative:  As per H&P written by Dr. Shona on 07/25/2024 Rachel Odonnell is a 68 y.o. female with medical history significant for CAD s/p PCI with stent placement, occluded stent s/p one-vessel bypass (September 2025), permanent A-fib on Coumadin , DM2, HTN, HLD, Hypothyroidism, chronic anxiety/depression, diabetic polyneuropathy, GERD, obesity, who presents to the ER with complaints of bilateral jaw pain and exertional shortness of breath.  States she was walking and doing her grocery shopping at [facility] yesterday when she suddenly developed bilateral jaw pain.  It was improved with resting.  Symptoms are similar to previous symptoms that led to CABG.  She endorses that her atypical cardiac related symptoms have always presented as bilateral jaw pain and not so much as chest pain.     She went to coumadin  clinic this morning and let them know about these symptoms she had experienced.  They communicated with her cardiologist, and was given a phone call and advised to come to the ER.   In the ER, hypertensive.  First 12 lead EKG showed no evidence of acute ischemia.  High sensitive troponin negative x 2.  Her bilateral jaw pain improved in the ER after nitroglycerin  patch, from 5-6/10 to 2/10 in severity.     EDP discussed the case with cardiology who recommended to trend troponin.  Admitted by [facility], Hospitalist service, for chest pain rule out ACS.   ED Course: Temp 98.  BP 151/75, HR 69, RR 15, O2 sat 96%.  Assessment & Plan: 1-chest pain: - Heart score of 5 - Negative troponins and no acute ischemic changes on EKG or telemetry - Patient with some typical presentation along with atypical features.- -After discussing with cardiology service decision made to check 2D echo and to hold Coumadin  in  case intervention needed. - Given ongoing symptoms and after further discussing with cardiology service patient will be transferred to [facility] for cardiac cath. - N.p.o. after midnight. - Continue aspirin , Imdur , statin and as needed nitroglycerin .  2-history of permanent atrial fibrillation - Currently rate controlled - Holding Coumadin  in the setting of possible ischemic workup -Heparin  drip has been started. - No requiring rate control agents at the moment. - Continue telemetry monitoring.  3-hyperlipidemia - Continue statin and fenofibrate .  4-hypertension - Continue current antihypertensive agents. - Vital signs stable for the most part.  5-history of hypothyroidism - Continue Synthroid .  6-type 2 diabetes mellitus - Continue to hold oral hypoglycemic agents while inpatient - Continue sliding scale insulin  and the use of Lantus  - Follow CBG fluctuation.  7-GERD - Continue PPI  8-history of restless leg syndrome - Continue Requip    DVT prophylaxis: Patient was receiving Coumadin ; per cardiology on-hold in case patient will need cardiac cath.  Heparin  drip started. Code Status: Full code. Family Communication: Husband at bedside. Disposition:   Status is: Observation The patient remains OBS appropriate and will d/c before 2 midnights.   Consultants:  Cardiology service  Procedures: See below for x-ray reports.  Antimicrobials: None   Subjective: Good saturation on room air, no nausea, no vomiting.  Patient continued reporting intermittent episode of chest tightness and jaw pain/spasm.  Objective: Vitals:   07/26/24 0437 07/26/24 0804 07/26/24 1900 07/27/24 1258  BP: (!) 105/44 (!) 119/49 (!) 109/52 121/62  Pulse: (!) 55 (!) 56 60 73  Resp: 18 20  17   Temp: 97.9 F (36.6 C) 97.8 F (36.6 C) 98.4 F (36.9 C) 98.2 F (36.8 C)  TempSrc: Oral Oral Oral Oral  SpO2: 93% 93% 96% 95%  Weight:      Height:        Intake/Output Summary (Last  24 hours) at 07/27/2024 1311 Last data filed at 07/27/2024 1200 Gross per 24 hour  Intake 433.39 ml  Output --  Net 433.39 ml   Filed Weights   07/25/24 1518 07/25/24 2003  Weight: 96 kg 99.1 kg    Examination: General exam: Alert, awake, oriented x 3; reported intermittent episode of chest tightness and discomfort.  Good saturation on room air.  No nausea, no vomiting. Respiratory system: Good movement bilaterally; no using accessory muscle. Cardiovascular system: Rate controlled, no rubs, no gallops, no JVD.  Positive murmur appreciated on exam. Gastrointestinal system: Abdomen is obese, nondistended, soft and nontender. No organomegaly or masses felt. Normal bowel sounds heard. Central nervous system: No focal neurological deficits. Extremities: No cyanosis or clubbing; no edema. Skin: No petechiae. Psychiatry: Judgement and insight appear normal. Mood & affect appropriate.   Data Reviewed: I have personally reviewed following labs and imaging studies  CBC: Recent Labs  Lab 07/25/24 1521 07/26/24 0158 07/27/24 0748  WBC 9.0 9.1 7.7  HGB 14.4 13.4 13.9  HCT 45.2 42.1 43.6  MCV 90.6 91.3 92.4  PLT 335 306 296    Basic Metabolic Panel: Recent Labs  Lab 07/21/24 0744 07/25/24 1521 07/26/24 0158  NA  --  138 138  K  --  4.3 4.7  CL  --  102 101  CO2  --  23 31  GLUCOSE  --  144* 163*  BUN  --  20 19  CREATININE  --  1.23* 1.15*  CALCIUM  10.4 10.6* 9.7  MG  --   --  2.6*  PHOS  --   --  3.8    GFR: Estimated Creatinine Clearance: 52.8 mL/min (A) (by C-G formula based on SCr of 1.15 mg/dL (H)).  Liver Function Tests: Recent Labs  Lab 07/21/24 0744  ALBUMIN  4.3    CBG: Recent Labs  Lab 07/26/24 1116 07/26/24 1608 07/26/24 1944 07/27/24 0741 07/27/24 1112  GLUCAP 171* 153* 219* 155* 213*     No results found for this or any previous visit (from the past 240 hours).   Radiology Studies: ECHOCARDIOGRAM COMPLETE Result Date: 07/26/2024     ECHOCARDIOGRAM REPORT   Patient Name:   Rachel Odonnell Seevers Date of Exam: 07/26/2024 Medical Rec #:  [id number]     Height:       64.0 in Accession #:    [id number]    Weight:       218.5 lb Date of Birth:  1956/07/18    BSA:          2.031 m Patient Age:    68 years      BP:           119/49 mmHg Patient Gender: F             HR:           52 bpm. Exam Location:  [facility] Procedure: 2D Echo, Cardiac Doppler, Color Doppler and Intracardiac            Opacification Agent (Both Spectral and Color Flow Doppler were  utilized during procedure). Indications:    Chest Pain  History:        Patient has prior history of Echocardiogram examinations, most                 recent 05/02/2024. Angina, CAD and Previous Myocardial                 Infarction, Prior CABG, COPD, Arrythmias:Atrial Fibrillation,                 Signs/Symptoms:Chest Pain and Hypotension; Risk                 Factors:Hypertension, Sleep Apnea, Dyslipidemia and Diabetes.  Sonographer:    Juliene Rucks Referring Phys: [id number] TERRY LOISE HURST  Sonographer Comments: Patient is obese. Image acquisition challenging due to COPD. IMPRESSIONS  1. Very distal apical hypokinesis noted with use of echocontrast. Left ventricular ejection fraction, by estimation, is 60 to 65%. The left ventricle has normal function. The left ventricle has no regional wall motion abnormalities. There is mild left ventricular hypertrophy. Left ventricular diastolic parameters are consistent with Grade I diastolic dysfunction (impaired relaxation).  2. Right ventricular systolic function is normal. The right ventricular size is normal.  3. The mitral valve is normal in structure. No evidence of mitral valve regurgitation. No evidence of mitral stenosis.  4. The aortic valve is tricuspid. There is severe calcifcation of the aortic valve. There is severe thickening of the aortic valve. Aortic valve regurgitation is not visualized. No aortic stenosis is present.  5. The inferior vena cava  is normal in size with greater than 50% respiratory variability, suggesting right atrial pressure of 3 mmHg. FINDINGS  Left Ventricle: Very distal apical hypokinesis noted with use of echocontrast. Left ventricular ejection fraction, by estimation, is 60 to 65%. The left ventricle has normal function. The left ventricle has no regional wall motion abnormalities. Definity contrast agent was given IV to delineate the left ventricular endocardial borders. The left ventricular internal cavity size was normal in size. There is mild left ventricular hypertrophy. Left ventricular diastolic parameters are consistent with Grade I diastolic dysfunction (impaired relaxation). Normal left ventricular filling pressure. Right Ventricle: The right ventricular size is normal. Right vetricular wall thickness was not well visualized. Right ventricular systolic function is normal. Left Atrium: Left atrial size was normal in size. Right Atrium: Right atrial size was normal in size. Pericardium: There is no evidence of pericardial effusion. Mitral Valve: The mitral valve is normal in structure. There is mild thickening of the mitral valve leaflet(s). There is mild calcification of the mitral valve leaflet(s). Mild mitral annular calcification. No evidence of mitral valve regurgitation. No evidence of mitral valve stenosis. Tricuspid Valve: The tricuspid valve is normal in structure. Tricuspid valve regurgitation is not demonstrated. No evidence of tricuspid stenosis. Aortic Valve: The aortic valve is tricuspid. There is severe calcifcation of the aortic valve. There is severe thickening of the aortic valve. There is severe aortic valve annular calcification. Aortic valve regurgitation is not visualized. No aortic stenosis is present. Aortic valve mean gradient measures 7.0 mmHg. Aortic valve peak gradient measures 20.1 mmHg. Aortic valve area, by VTI measures 1.78 cm. Pulmonic Valve: The pulmonic valve was not well visualized. Pulmonic  valve regurgitation is not visualized. No evidence of pulmonic stenosis. Aorta: The aortic root is normal in size and structure. Venous: The inferior vena cava is normal in size with greater than 50% respiratory variability, suggesting right atrial pressure of 3 mmHg. IAS/Shunts:  No atrial level shunt detected by color flow Doppler.  LEFT VENTRICLE PLAX 2D LVIDd:         4.00 cm   Diastology LVIDs:         2.70 cm   LV e' medial:    4.91 cm/s LV PW:         1.10 cm   LV E/e' medial:  14.5 LV IVS:        1.30 cm   LV e' lateral:   7.89 cm/s LVOT diam:     1.90 cm   LV E/e' lateral: 9.0 LV SV:         64 LV SV Index:   31 LVOT Area:     2.84 cm  RIGHT VENTRICLE RV S prime:     8.06 cm/s TAPSE (M-mode): 1.2 cm LEFT ATRIUM             Index        RIGHT ATRIUM           Index LA diam:        3.70 cm 1.82 cm/m   RA Area:     14.10 cm LA Vol (A2C):   30.0 ml 14.77 ml/m  RA Volume:   29.80 ml  14.67 ml/m LA Vol (A4C):   44.6 ml 21.96 ml/m LA Biplane Vol: 36.7 ml 18.07 ml/m  AORTIC VALVE AV Area (Vmax):    1.10 cm AV Area (Vmean):   1.53 cm AV Area (VTI):     1.78 cm AV Vmax:           224.00 cm/s AV Vmean:          113.000 cm/s AV VTI:            0.356 m AV Peak Grad:      20.1 mmHg AV Mean Grad:      7.0 mmHg LVOT Vmax:         87.00 cm/s LVOT Vmean:        60.800 cm/s LVOT VTI:          0.224 m LVOT/AV VTI ratio: 0.63  AORTA Ao Root diam: 2.70 cm MITRAL VALVE               TRICUSPID VALVE MV Area (PHT): 2.83 cm    TR Peak grad:   11.3 mmHg MV Decel Time: 268 msec    TR Vmax:        168.00 cm/s MV E velocity: 71.00 cm/s MV A velocity: 87.40 cm/s  SHUNTS MV E/A ratio:  0.81        Systemic VTI:  0.22 m                            Systemic Diam: 1.90 cm Dorn Ross MD Electronically signed by Dorn Ross MD Signature Date/Time: 07/26/2024/6:26:59 PM    Final    DG Chest Port 1 View Result Date: 07/25/2024 CLINICAL DATA:  Chest pain EXAM: PORTABLE CHEST 1 VIEW COMPARISON:  Prior chest x-ray 06/23/2024  FINDINGS: Patient is status post median sternotomy. Cardiac and mediastinal contours are unchanged. Lungs are clear save for mild chronic bronchitic changes. No overt edema, pleural effusion, pneumothorax or focal airspace infiltrate. No acute osseous abnormality. IMPRESSION: No active disease. Electronically Signed   By: Wilkie Lent M.D.   On: 07/25/2024 16:19   Scheduled Meds:  aspirin  EC  325 mg Oral Daily   busPIRone   5  mg Oral QHS   citalopram   20 mg Oral QPM   fenofibrate   160 mg Oral QHS   gabapentin   100 mg Oral QHS   insulin  aspart  0-5 Units Subcutaneous QHS   insulin  aspart  0-9 Units Subcutaneous TID WC   insulin  glargine  3 Units Subcutaneous BID   isosorbide  mononitrate  30 mg Oral Once   [START ON 07/28/2024] isosorbide  mononitrate  90 mg Oral Daily   levothyroxine   50 mcg Oral QAC breakfast   montelukast   10 mg Oral QHS   pantoprazole   80 mg Oral QHS   rOPINIRole   2 mg Oral QHS   rosuvastatin   40 mg Oral QPM   Continuous Infusions:  heparin  1,550 Units/hr (07/27/24 0917)     LOS: 0 days    Time spent: 35 minutes    Eric Nunnery, MD [facility]   To contact the attending provider between 7A-7P or the covering provider during after hours 7P-7A, please log into the web site [email or web address] and access using universal [place] password for that web site. If you do not have the password, please call the hospital operator.  07/27/2024, 1:11 PM

## 2024-07-27 NOTE — Consult Note (Signed)
 PHARMACY - ANTICOAGULATION CONSULT NOTE  Pharmacy Consult for heparin - holding warfarin Indication: atrial fibrillation  Allergies  Allergen Reactions   Atomoxetine Nausea And Vomiting   Azithromycin Hives   Lisinopril Nausea And Vomiting   Atorvastatin Other (See Comments)    Restless legs   Codeine Nausea Only    Patient Measurements: Height: 5' 4 (162.6 cm) Weight: 99.1 kg (218 lb 7.6 oz) IBW/kg (Calculated) : 54.7 HEPARIN  DW (KG): 76.7  Vital Signs: Temp: 98.2 F (36.8 C) (12/11 1258) Temp Source: Oral (12/11 1258) BP: 121/62 (12/11 1258) Pulse Rate: 73 (12/11 1258)  Labs: Recent Labs    07/25/24 1521 07/26/24 0158 07/26/24 1640 07/26/24 2243 07/27/24 0748 07/27/24 1657  HGB 14.4 13.4  --   --  13.9  --   HCT 45.2 42.1  --   --  43.6  --   PLT 335 306  --   --  296  --   LABPROT 18.5* 19.0*  --   --  22.7*  --   INR 1.5* 1.5*  --   --  1.9*  --   HEPARINUNFRC  --   --    < > 0.24* <0.10* 0.38  CREATININE 1.23* 1.15*  --   --   --   --    < > = values in this interval not displayed.    Estimated Creatinine Clearance: 52.8 mL/min (A) (by C-G formula based on SCr of 1.15 mg/dL (H)).   Medical History: Past Medical History:  Diagnosis Date   ADD (attention deficit disorder)    CAD (coronary artery disease)    Diabetes mellitus without complication (HCC)    Hypertension    PAF (paroxysmal atrial fibrillation) (HCC)     Medications:  Warfarin 5mg  every Tuesday and 2.5mg  all other days for the week (20mg /week) TTR 57.5%  Assessment: Patient presents to ED with chest pain, and shortness of breath. INR Subtherapeutic on admission. Pharmacy consulted to hold warfarin and dose heparin  for potential cardiac cath.  HL 0.38- therapeutic- no issues noted INR 1.5> 1.5>1.9 CBC WNL  Goal of Therapy:  INR 2-3 Monitor platelets by anticoagulation protocol: Yes   Plan:  Continue heparin  infusion at 1550 units/hr Monitor heparin  level in am Continue to  monitor H&H and platelets  Dempsey Blush PharmD., BCPS Clinical Pharmacist 07/27/2024 5:23 PM

## 2024-07-27 NOTE — Consult Note (Signed)
 PHARMACY - ANTICOAGULATION  Pharmacy Consult for heparin  Indication: atrial fibrillation Brief A/P:  Heparin  level subtherapeutic Increase Heparin  rate  Allergies  Allergen Reactions   Atomoxetine Nausea And Vomiting   Azithromycin Hives   Lisinopril Nausea And Vomiting   Atorvastatin Other (See Comments)    Restless legs   Codeine Nausea Only    Patient Measurements: Height: 5' 4 (162.6 cm) Weight: 99.1 kg (218 lb 7.6 oz) IBW/kg (Calculated) : 54.7 HEPARIN  DW (KG): 76.7  Vital Signs: Temp: 98.4 F (36.9 C) (12/10 1900) Temp Source: Oral (12/10 1900) BP: 109/52 (12/10 1900) Pulse Rate: 60 (12/10 1900)  Labs: Recent Labs    07/25/24 1153 07/25/24 1521 07/26/24 0158 07/26/24 1640 07/26/24 2243  HGB  --  14.4 13.4  --   --   HCT  --  45.2 42.1  --   --   PLT  --  335 306  --   --   LABPROT  --  18.5* 19.0*  --   --   INR 1.7* 1.5* 1.5*  --   --   HEPARINUNFRC  --   --   --  0.34 0.24*  CREATININE  --  1.23* 1.15*  --   --     Estimated Creatinine Clearance: 52.8 mL/min (A) (by C-G formula based on SCr of 1.15 mg/dL (H)).  Assessment: 68 y.o. female with chest pain for heparin    Goal of Therapy:  INR 2-3 Monitor platelets by anticoagulation protocol: Yes   Plan:  Increase Heparin  1350 units/hr Follow-up am labs.  Cathlyn Arrant, PharmD, BCPS  07/27/2024 12:26 AM

## 2024-07-27 NOTE — H&P (View-Only) (Signed)
 Rounding Note   Patient Name: Rachel Odonnell Date of Encounter: 07/27/2024  [facility] Cardiologist: Jayson Sierras, MD   Subjective  She reports having at least 3 episodes of jaw pain since yesterday which have spontaneously resolved. An episode yesterday afternoon occurred while walking around her room and the other two occurred while in the bed. Breathing has been stable. No palpitations.   Scheduled Meds:  aspirin  EC  325 mg Oral Daily   busPIRone   5 mg Oral QHS   citalopram   20 mg Oral QPM   fenofibrate   160 mg Oral QHS   gabapentin   100 mg Oral QHS   insulin  aspart  0-5 Units Subcutaneous QHS   insulin  aspart  0-9 Units Subcutaneous TID WC   insulin  glargine  3 Units Subcutaneous BID   isosorbide  mononitrate  30 mg Oral Daily   levothyroxine   50 mcg Oral QAC breakfast   montelukast   10 mg Oral QHS   pantoprazole   80 mg Oral QHS   rOPINIRole   2 mg Oral QHS   rosuvastatin   40 mg Oral QPM   Continuous Infusions:  heparin  1,350 Units/hr (07/27/24 0616)   PRN Meds: acetaminophen , melatonin, polyethylene glycol, prochlorperazine    Vital Signs  Vitals:   07/26/24 0021 07/26/24 0437 07/26/24 0804 07/26/24 1900  BP: 122/62 (!) 105/44 (!) 119/49 (!) 109/52  Pulse: (!) 57 (!) 55 (!) 56 60  Resp: 18 18 20    Temp: 97.7 F (36.5 C) 97.9 F (36.6 C) 97.8 F (36.6 C) 98.4 F (36.9 C)  TempSrc: Oral Oral Oral Oral  SpO2: 92% 93% 93% 96%  Weight:      Height:        Intake/Output Summary (Last 24 hours) at 07/27/2024 0855 Last data filed at 07/26/2024 1731 Gross per 24 hour  Intake 553.39 ml  Output --  Net 553.39 ml      07/25/2024    8:03 PM 07/25/2024    3:18 PM 06/28/2024    3:22 PM  Last 3 Weights  Weight (lbs) 218 lb 7.6 oz 211 lb 10.3 oz 213 lb 3 oz  Weight (kg) 99.1 kg 96 kg 96.7 kg      Telemetry  NSR with episodes of sinus bradycardia, HR in 50's with occasional PVC's.  - Personally Reviewed  ECG  No new tracings.   Physical  Exam  GEN: Appears in no acute distress.   Neck: No JVD Cardiac: RRR, no murmurs, rubs, or gallops.  Respiratory: Clear to auscultation bilaterally. GI: Soft, nontender, non-distended  MS: No pitting edema; No deformity. Neuro:  Nonfocal  Psych: Normal affect   Labs High Sensitivity Troponin:  No results for input(s): TROPONINIHS in the last 720 hours.   Chemistry Recent Labs  Lab 07/21/24 0744 07/25/24 1521 07/26/24 0158  NA  --  138 138  K  --  4.3 4.7  CL  --  102 101  CO2  --  23 31  GLUCOSE  --  144* 163*  BUN  --  20 19  CREATININE  --  1.23* 1.15*  CALCIUM  10.4 10.6* 9.7  MG  --   --  2.6*  ALBUMIN  4.3  --   --   GFRNONAA  --  47* 51*  ANIONGAP  --  14 6    Lipids No results for input(s): CHOL, TRIG, HDL, LABVLDL, LDLCALC, CHOLHDL in the last 168 hours.  Hematology Recent Labs  Lab 07/25/24 1521 07/26/24 0158 07/27/24 0748  WBC 9.0 9.1  7.7  RBC 4.99 4.61 4.72  HGB 14.4 13.4 13.9  HCT 45.2 42.1 43.6  MCV 90.6 91.3 92.4  MCH 28.9 29.1 29.4  MCHC 31.9 31.8 31.9  RDW 13.6 13.5 13.4  PLT 335 306 296   Thyroid  No results for input(s): TSH, FREET4 in the last 168 hours.  BNP Recent Labs  Lab 07/25/24 1521  PROBNP 235.0    DDimer No results for input(s): DDIMER in the last 168 hours.   Radiology   DG Chest Port 1 View Result Date: 07/25/2024 CLINICAL DATA:  Chest pain EXAM: PORTABLE CHEST 1 VIEW COMPARISON:  Prior chest x-ray 06/23/2024 FINDINGS: Patient is status post median sternotomy. Cardiac and mediastinal contours are unchanged. Lungs are clear save for mild chronic bronchitic changes. No overt edema, pleural effusion, pneumothorax or focal airspace infiltrate. No acute osseous abnormality. IMPRESSION: No active disease. Electronically Signed   By: Wilkie Lent M.D.   On: 07/25/2024 16:19    Cardiac Studies  Echocardiogram: 07/26/2024 IMPRESSIONS     1. Very distal apical hypokinesis noted with use of echocontrast.  Left  ventricular ejection fraction, by estimation, is 60 to 65%. The left  ventricle has normal function. The left ventricle has no regional wall  motion abnormalities. There is mild left  ventricular hypertrophy. Left ventricular diastolic parameters are  consistent with Grade I diastolic dysfunction (impaired relaxation).   2. Right ventricular systolic function is normal. The right ventricular  size is normal.   3. The mitral valve is normal in structure. No evidence of mitral valve  regurgitation. No evidence of mitral stenosis.   4. The aortic valve is tricuspid. There is severe calcifcation of the  aortic valve. There is severe thickening of the aortic valve. Aortic valve  regurgitation is not visualized. No aortic stenosis is present.   5. The inferior vena cava is normal in size with greater than 50%  respiratory variability, suggesting right atrial pressure of 3 mmHg.    Patient Profile   68 y.o. female w/ PMH of CAD (s/p DES to mid-LAD in 03/2023 and occlusion of diagonal jailed by stent and treated with POBA, s/p NSTEMI in 04/2024 with cath showing ISR of proximal LAD stent and underwent off-pump CABG on 05/06/2024 with LIMA-LAD), aortic stenosis, paroxysmal atrial fibrillation, HTN, HLD and OSA who is currently admitted for evaluation of jaw pain.  Assessment & Plan   1. Jaw-pain concerning for Accelerating Angina - Presented with worsening jaw pain with exertion over the past 2 days which was her prior anginal symptom. Troponin values were negative but EKG did show TWI along the lateral leads. Repeat echocardiogram shows a preserved EF of 60 to 65% and she does have very distal apical hypokinesis. - Discussion today with the patient and her husband who is at the bedside. She does not wish to undergo a stress test as she reports feeling like she was going to die with Lexiscan  in the past.  Reviewed options for a cardiac catheterization but they prefer to hold off on invasive  evaluation and continue to try medical therapy initially. Therefore, will further titrate Imdur  from 30 mg daily to 60 mg daily. If she continues to have episodes of jaw-pain resembling her angina, would plan for cardiac catheterization tomorrow. Continue IV Heparin  for now along with ASA and Crestor . No beta-blocker due to bradycardia.  2. CAD - She recently underwent CABG with LIMA to LAD in 04/2024. Remains on ASA 325 mg daily and Crestor  40 mg daily. Can  possibly reduce ASA to 81 mg daily since on Coumadin  as well.  3. Paroxysmal Atrial Fibrillation - She has been maintaining normal sinus rhythm this admission and has not required the use of AV nodal blocking agents. - She was on Coumadin  prior to admission and INR was at 1.5 when checked on 07/26/2024.  Coumadin  has been held and she is being bridged with IV Heparin  in case she requires cardiac catheterization.  4. AS - There was no evidence of stenosis by repeat echocardiogram this admission.  5. HTN - BP has overall been well-controlled, at 109/52 on most recent check. She has been started on Imdur  30 mg daily as discussed above.  6. HLD - LDL was at 70 when checked earlier this month. She remains on Crestor  40 mg daily and Fenofibrate  160 mg daily. Would recheck an FLP in 2 months and if LDL remains above goal, plan to add Zetia.  For questions or updates, please contact [facility] Please consult [email or web address] for contact info under      Signed, Laymon CHRISTELLA Qua, PA-C  07/27/2024, 8:55 AM    Attending Note  Patient seen and discussed with PA Qua, I agree with her documentation  1.CAD/Chest pain - prior DES 03/2023 to mid LAD complicated by jailing of diagnonal that was treated with POBA - Admit NSTEMI 04/2024, cath findings as reported above. Received CABG with LIMA-LAD.  -trops negative, EKG lateral TWIs - Echo LVEF 60-65%, no WMAs - SOB and jaw pain exactly similar to her presentation in 03/2023 when she  received a stent and 04/2024 when presented with NSTEMI and had CABG LIMA-LAD.   - after NSTEMI and CABG 04/2024 was discharged on ASA 325, coumadin  per CT surgery. Review long term antiplatelet/anticoag plan. After her prior stent was on triple therapy  then plavix  and coumadin .   -high concern for unstable angina, she agrees for cath. Plan for when INR allows, would go ahead and arrange transfer today. Arrange cath tomorrow, may be postponed however if INR not a level.    2.Permanent afib - holding coumadin  for  invasive testing, on hep gtt   Dorn Ross MD

## 2024-07-27 NOTE — Plan of Care (Signed)

## 2024-07-27 NOTE — Plan of Care (Signed)
°  Problem: Education: Goal: Knowledge of General Education information will improve Description: Including pain rating scale, medication(s)/side effects and non-pharmacologic comfort measures Outcome: Progressing   Problem: Health Behavior/Discharge Planning: Goal: Ability to manage health-related needs will improve Outcome: Progressing   Problem: Clinical Measurements: Goal: Ability to maintain clinical measurements within normal limits will improve Outcome: Progressing Goal: Will remain free from infection Outcome: Progressing Goal: Diagnostic test results will improve Outcome: Progressing Goal: Respiratory complications will improve Outcome: Progressing   Problem: Activity: Goal: Risk for activity intolerance will decrease Outcome: Progressing   Problem: Nutrition: Goal: Adequate nutrition will be maintained Outcome: Progressing   Problem: Coping: Goal: Level of anxiety will decrease Outcome: Progressing   Problem: Elimination: Goal: Will not experience complications related to bowel motility Outcome: Progressing Goal: Will not experience complications related to urinary retention Outcome: Progressing   Problem: Pain Managment: Goal: General experience of comfort will improve and/or be controlled Outcome: Progressing   Problem: Safety: Goal: Ability to remain free from injury will improve Outcome: Progressing   Problem: Skin Integrity: Goal: Risk for impaired skin integrity will decrease Outcome: Progressing   Problem: Education: Goal: Ability to describe self-care measures that may prevent or decrease complications (Diabetes Survival Skills Education) will improve Outcome: Progressing Goal: Individualized Educational Video(s) Outcome: Progressing   Problem: Coping: Goal: Ability to adjust to condition or change in health will improve Outcome: Progressing   Problem: Fluid Volume: Goal: Ability to maintain a balanced intake and output will  improve Outcome: Progressing   Problem: Health Behavior/Discharge Planning: Goal: Ability to identify and utilize available resources and services will improve Outcome: Progressing Goal: Ability to manage health-related needs will improve Outcome: Progressing   Problem: Metabolic: Goal: Ability to maintain appropriate glucose levels will improve Outcome: Progressing   Problem: Nutritional: Goal: Maintenance of adequate nutrition will improve Outcome: Progressing Goal: Progress toward achieving an optimal weight will improve Outcome: Progressing   Problem: Skin Integrity: Goal: Risk for impaired skin integrity will decrease Outcome: Progressing   Problem: Tissue Perfusion: Goal: Adequacy of tissue perfusion will improve Outcome: Progressing   Problem: Clinical Measurements: Goal: Cardiovascular complication will be avoided Outcome: Not Progressing Note: IMDUR  INCREASED; PENDING POTENTIAL CARDIAC CATH

## 2024-07-27 NOTE — Consult Note (Addendum)
 PHARMACY - ANTICOAGULATION CONSULT NOTE  Pharmacy Consult for heparin - holding warfarin Indication: atrial fibrillation  Allergies  Allergen Reactions   Atomoxetine Nausea And Vomiting   Azithromycin Hives   Lisinopril Nausea And Vomiting   Atorvastatin Other (See Comments)    Restless legs   Codeine Nausea Only    Patient Measurements: Height: 5' 4 (162.6 cm) Weight: 99.1 kg (218 lb 7.6 oz) IBW/kg (Calculated) : 54.7 HEPARIN  DW (KG): 76.7  Vital Signs:    Labs: Recent Labs    07/25/24 1153 07/25/24 1521 07/25/24 1521 07/26/24 0158 07/26/24 1640 07/26/24 2243 07/27/24 0748  HGB  --  14.4   < > 13.4  --   --  13.9  HCT  --  45.2  --  42.1  --   --  43.6  PLT  --  335  --  306  --   --  296  LABPROT  --  18.5*  --  19.0*  --   --   --   INR 1.7* 1.5*  --  1.5*  --   --   --   HEPARINUNFRC  --   --   --   --  0.34 0.24* <0.10*  CREATININE  --  1.23*  --  1.15*  --   --   --    < > = values in this interval not displayed.    Estimated Creatinine Clearance: 52.8 mL/min (A) (by C-G formula based on SCr of 1.15 mg/dL (H)).   Medical History: Past Medical History:  Diagnosis Date   ADD (attention deficit disorder)    CAD (coronary artery disease)    Diabetes mellitus without complication (HCC)    Hypertension    PAF (paroxysmal atrial fibrillation) (HCC)     Medications:  Warfarin 5mg  every Tuesday and 2.5mg  all other days for the week (20mg /week) TTR 57.5%  Assessment: Patient presents to ED with chest pain, and shortness of breath. INR Subtherapeutic on admission. Pharmacy consulted to hold warfarin and dose heparin  for potential cardiac cath.  HL <0.10- subtherapeutic- confirmed with RN no issues INR 1.5> 1.5 CBC WNL  Goal of Therapy:  INR 2-3 Monitor platelets by anticoagulation protocol: Yes   Plan:  Rebolus 2000 units IV x 1 Continue heparin  infusion at 1550 units/hr Monitor heparin  level in 6-8 hours and daily Continue to monitor H&H and  platelets  Elspeth Sour, PharmD Clinical Pharmacist 07/27/2024 9:16 AM

## 2024-07-28 ENCOUNTER — Inpatient Hospital Stay (HOSPITAL_COMMUNITY): Admission: EM | Disposition: A | Payer: Self-pay | Source: Ambulatory Visit | Attending: Internal Medicine

## 2024-07-28 DIAGNOSIS — I959 Hypotension, unspecified: Secondary | ICD-10-CM | POA: Diagnosis not present

## 2024-07-28 DIAGNOSIS — I2510 Atherosclerotic heart disease of native coronary artery without angina pectoris: Secondary | ICD-10-CM | POA: Diagnosis present

## 2024-07-28 DIAGNOSIS — I200 Unstable angina: Secondary | ICD-10-CM | POA: Diagnosis not present

## 2024-07-28 DIAGNOSIS — I4821 Permanent atrial fibrillation: Secondary | ICD-10-CM | POA: Diagnosis present

## 2024-07-28 DIAGNOSIS — G2581 Restless legs syndrome: Secondary | ICD-10-CM | POA: Diagnosis present

## 2024-07-28 DIAGNOSIS — F419 Anxiety disorder, unspecified: Secondary | ICD-10-CM | POA: Diagnosis present

## 2024-07-28 DIAGNOSIS — E785 Hyperlipidemia, unspecified: Secondary | ICD-10-CM | POA: Diagnosis not present

## 2024-07-28 DIAGNOSIS — E66812 Obesity, class 2: Secondary | ICD-10-CM

## 2024-07-28 DIAGNOSIS — K219 Gastro-esophageal reflux disease without esophagitis: Secondary | ICD-10-CM | POA: Diagnosis present

## 2024-07-28 DIAGNOSIS — E1165 Type 2 diabetes mellitus with hyperglycemia: Secondary | ICD-10-CM | POA: Diagnosis present

## 2024-07-28 DIAGNOSIS — Z7901 Long term (current) use of anticoagulants: Secondary | ICD-10-CM | POA: Diagnosis not present

## 2024-07-28 DIAGNOSIS — R079 Chest pain, unspecified: Secondary | ICD-10-CM | POA: Diagnosis present

## 2024-07-28 DIAGNOSIS — F32A Depression, unspecified: Secondary | ICD-10-CM | POA: Diagnosis present

## 2024-07-28 DIAGNOSIS — I25110 Atherosclerotic heart disease of native coronary artery with unstable angina pectoris: Secondary | ICD-10-CM

## 2024-07-28 DIAGNOSIS — E7849 Other hyperlipidemia: Secondary | ICD-10-CM | POA: Diagnosis present

## 2024-07-28 DIAGNOSIS — I252 Old myocardial infarction: Secondary | ICD-10-CM | POA: Diagnosis not present

## 2024-07-28 DIAGNOSIS — E039 Hypothyroidism, unspecified: Secondary | ICD-10-CM | POA: Diagnosis present

## 2024-07-28 DIAGNOSIS — G4733 Obstructive sleep apnea (adult) (pediatric): Secondary | ICD-10-CM | POA: Diagnosis present

## 2024-07-28 DIAGNOSIS — I480 Paroxysmal atrial fibrillation: Secondary | ICD-10-CM | POA: Diagnosis not present

## 2024-07-28 DIAGNOSIS — Z7902 Long term (current) use of antithrombotics/antiplatelets: Secondary | ICD-10-CM | POA: Diagnosis not present

## 2024-07-28 DIAGNOSIS — I10 Essential (primary) hypertension: Secondary | ICD-10-CM | POA: Diagnosis present

## 2024-07-28 DIAGNOSIS — E1142 Type 2 diabetes mellitus with diabetic polyneuropathy: Secondary | ICD-10-CM | POA: Diagnosis present

## 2024-07-28 DIAGNOSIS — Z794 Long term (current) use of insulin: Secondary | ICD-10-CM | POA: Diagnosis not present

## 2024-07-28 DIAGNOSIS — Z7982 Long term (current) use of aspirin: Secondary | ICD-10-CM | POA: Diagnosis not present

## 2024-07-28 DIAGNOSIS — R0789 Other chest pain: Secondary | ICD-10-CM | POA: Diagnosis present

## 2024-07-28 DIAGNOSIS — I350 Nonrheumatic aortic (valve) stenosis: Secondary | ICD-10-CM | POA: Diagnosis present

## 2024-07-28 DIAGNOSIS — Z7984 Long term (current) use of oral hypoglycemic drugs: Secondary | ICD-10-CM | POA: Diagnosis not present

## 2024-07-28 DIAGNOSIS — I2583 Coronary atherosclerosis due to lipid rich plaque: Secondary | ICD-10-CM | POA: Diagnosis not present

## 2024-07-28 DIAGNOSIS — Z7401 Bed confinement status: Secondary | ICD-10-CM | POA: Diagnosis not present

## 2024-07-28 DIAGNOSIS — I440 Atrioventricular block, first degree: Secondary | ICD-10-CM | POA: Diagnosis present

## 2024-07-28 DIAGNOSIS — Z6837 Body mass index (BMI) 37.0-37.9, adult: Secondary | ICD-10-CM | POA: Diagnosis not present

## 2024-07-28 DIAGNOSIS — J432 Centrilobular emphysema: Secondary | ICD-10-CM | POA: Diagnosis present

## 2024-07-28 DIAGNOSIS — E1169 Type 2 diabetes mellitus with other specified complication: Secondary | ICD-10-CM | POA: Diagnosis present

## 2024-07-28 HISTORY — PX: CORONARY STENT INTERVENTION: CATH118234

## 2024-07-28 HISTORY — PX: LEFT HEART CATH AND CORS/GRAFTS ANGIOGRAPHY: CATH118250

## 2024-07-28 LAB — GLUCOSE, CAPILLARY
Glucose-Capillary: 176 mg/dL — ABNORMAL HIGH (ref 70–99)
Glucose-Capillary: 167 mg/dL — ABNORMAL HIGH (ref 70–99)
Glucose-Capillary: 175 mg/dL — ABNORMAL HIGH (ref 70–99)
Glucose-Capillary: 213 mg/dL — ABNORMAL HIGH (ref 70–99)

## 2024-07-28 LAB — HEPARIN LEVEL (UNFRACTIONATED): Heparin Unfractionated: 0.41 [IU]/mL (ref 0.30–0.70)

## 2024-07-28 LAB — CBC
HCT: 42.9 % (ref 36.0–46.0)
Hemoglobin: 13.9 g/dL (ref 12.0–15.0)
MCH: 29.3 pg (ref 26.0–34.0)
MCHC: 32.4 g/dL (ref 30.0–36.0)
MCV: 90.3 fL (ref 80.0–100.0)
Platelets: 312 K/uL (ref 150–400)
RBC: 4.75 MIL/uL (ref 3.87–5.11)
RDW: 13.6 % (ref 11.5–15.5)
WBC: 7.3 K/uL (ref 4.0–10.5)
nRBC: 0 % (ref 0.0–0.2)

## 2024-07-28 LAB — PROTIME-INR
INR: 1.9 — ABNORMAL HIGH (ref 0.8–1.2)
Prothrombin Time: 22.5 s — ABNORMAL HIGH (ref 11.4–15.2)

## 2024-07-28 LAB — POCT ACTIVATED CLOTTING TIME: Activated Clotting Time: 189 s

## 2024-07-28 MED ORDER — ASPIRIN 81 MG PO CHEW: 81.0000 mg | CHEWABLE_TABLET | ORAL | Status: DC

## 2024-07-28 MED ORDER — HEPARIN (PORCINE) IN NACL 1000-0.9 UT/500ML-% IV SOLN
INTRAVENOUS | Status: DC | PRN
  Administered 2024-07-28: 1000 mL

## 2024-07-28 MED ORDER — MIDAZOLAM HCL (PF) 2 MG/2ML IJ SOLN
INTRAMUSCULAR | Status: DC | PRN
  Administered 2024-07-28 (×2): 1 mg via INTRAVENOUS

## 2024-07-28 MED ORDER — SODIUM CHLORIDE 0.9% FLUSH
3.0000 mL | Freq: Two times a day (BID) | INTRAVENOUS | Status: DC
  Administered 2024-07-28 – 2024-07-29 (×2): 3 mL via INTRAVENOUS

## 2024-07-28 MED ORDER — FREE WATER
500.0000 mL | Freq: Once | Status: AC
  Administered 2024-07-28: 500 mL via ORAL

## 2024-07-28 MED ORDER — SODIUM CHLORIDE 0.9% FLUSH: 3.0000 mL | INTRAVENOUS | Status: DC | PRN

## 2024-07-28 MED ORDER — CLOPIDOGREL BISULFATE 300 MG PO TABS
ORAL_TABLET | ORAL | Status: DC | PRN
  Administered 2024-07-28: 600 mg via ORAL

## 2024-07-28 MED ORDER — CLOPIDOGREL BISULFATE 75 MG PO TABS
75.0000 mg | ORAL_TABLET | Freq: Every day | ORAL | Status: DC
  Administered 2024-07-29: 75 mg via ORAL
  Filled 2024-07-28: qty 1

## 2024-07-28 MED ORDER — FREE WATER: 500.0000 mL | Freq: Once | Status: DC

## 2024-07-28 MED ORDER — HYDRALAZINE HCL 20 MG/ML IJ SOLN: 10.0000 mg | INTRAMUSCULAR | Status: AC | PRN

## 2024-07-28 MED ORDER — LABETALOL HCL 5 MG/ML IV SOLN: 10.0000 mg | INTRAVENOUS | Status: AC | PRN

## 2024-07-28 MED ORDER — SODIUM CHLORIDE 0.9 % IV SOLN: 250.0000 mL | INTRAVENOUS | Status: DC | PRN

## 2024-07-28 MED ORDER — SODIUM CHLORIDE 0.9 % IV SOLN
INTRAVENOUS | Status: DC | PRN
  Administered 2024-07-28: 250 mL via INTRAVENOUS

## 2024-07-28 MED ORDER — LIDOCAINE HCL (PF) 1 % IJ SOLN
INTRAMUSCULAR | Status: DC | PRN
  Administered 2024-07-28: 5 mL via INTRADERMAL

## 2024-07-28 MED ORDER — FENTANYL CITRATE (PF) 100 MCG/2ML IJ SOLN
INTRAMUSCULAR | Status: DC | PRN
  Administered 2024-07-28 (×3): 25 ug via INTRAVENOUS

## 2024-07-28 MED ORDER — ASPIRIN 81 MG PO CHEW
81.0000 mg | CHEWABLE_TABLET | Freq: Every day | ORAL | Status: DC
  Administered 2024-07-29: 81 mg via ORAL
  Filled 2024-07-28: qty 1

## 2024-07-28 MED ORDER — FAMOTIDINE IN NACL 20-0.9 MG/50ML-% IV SOLN
INTRAVENOUS | Status: AC | PRN
  Administered 2024-07-28: 20 mg via INTRAVENOUS

## 2024-07-28 MED ADMIN — Heparin Sodium (Porcine) Inj 1000 Unit/ML: 12000 [IU] | INTRAVENOUS | NDC 63323054011

## 2024-07-28 MED ADMIN — Nitroglycerin IV Soln 100 MCG/ML in D5W: 200 ug | INTRACORONARY | NDC 61141483410

## 2024-07-28 MED ADMIN — Iohexol IV Soln 350 MG/ML: 130 mL | NDC 00407141491

## 2024-07-28 NOTE — Assessment & Plan Note (Signed)
 Continue losartan  for blood pressure control.

## 2024-07-28 NOTE — Plan of Care (Signed)

## 2024-07-28 NOTE — Progress Notes (Signed)
 Progress Note   Patient: Rachel Odonnell FMW:[id number] DOB: 04-09-1956 DOA: 07/25/2024     0 DOS: the patient was seen and examined on 07/28/2024   Brief hospital course: Mrs. Maffia was admitted to the hospital with the working diagnosis of unstable angina   68 yo female with the past medical history of coronary artery disease, sp stenting and CABG, atrial fibrillation, hypertension, hyperlipidemia, T2DM and obesity class 2 who presented with bilateral jaw pain and exertional dyspnea.  24 hrs prior to admission, patient developed acute symptoms on exertion, consistent with bilateral jaw pain, associated with dyspnea, that improved with resting. On the day of admission she was evaluated at the warfarin clinic, and decision was made to refer her to the ED.  On her initial physical examination her blood pressure was 151/75, HR 69, RR 15 and 02 saturation 96% Lungs with no wheezing or rhonchi, heart with S1 and S2 present and regular with no gallops, rubs or murmurs, abdomen with no distention and no lower extremity edema.   Na 138, K 4.3 Cl 102 bicarbonate 23 glucose 144 bun 20 cr 1,23 BNP 235 High sensitive troponin 17 and 15  Wbc 9,0 hgb 14.4 plt 335   Chest radiograph with hypoinflation, with positive cardiomegaly, no infiltrates or effusions, sternotomy wires in place.   EKG 56 bpm, normal axis, normal intervals, qtc 479, sinus rhythm with 1st degree AV block, with no significant ST segment changes, negative T wave lead I, aVL, lead II, III, aVF, V4 to V6. (New lateral T wave changes)   Patient was placed on aspirin , statin and isosorbide .   12/12 transferred from [facility] for cardiac catheterization with coronary angiography. Diffuse coronary artery disease, underwent successful PTCA/DES x1 proximal to mid RCA.    Assessment and Plan: * CAD (coronary artery disease) Unstable angina,   Cardiac catheterization with severe restenosis proximal LAD stented segment. Patent LIMA to LAD.  Stable moderate obtuse marginal branch stenosis. Unchanged from last two catheterizations.  Severe stenosis in the proximal RCA.   Successful PTCA/DES x1 proximal to mid RCA Normal LV systolic function.   Plan to continue dual antiplatelet therapy with aspirin  and clopidogrel  for at least 6 months.  Continue isosorbide .   Essential hypertension, benign Continue blood pressure monitoring   Paroxysmal atrial fibrillation (HCC) Patient has been on warfarin for anticoagulation No AV blocking agents.   Type 2 diabetes mellitus with hyperlipidemia (HCC) Continue glucose cover and monitoring with insulin  sliding scale Continue with statin   Centrilobular emphysema (HCC) No signs of acute exacerbation  Obstructive sleep apnea Cpap   Hypothyroidism Continue levothyroxine    Obesity, class 2 Calculated BMI is 37.5         Subjective: patient with no chest pain and no dyspnea   Physical Exam: Vitals:   07/28/24 1550 07/28/24 1555 07/28/24 1600 07/28/24 1605  BP: 109/66 (!) 112/59 111/75 115/63  Pulse: (!) 53 (!) 54 (!) 56 (!) 57  Resp: (!) 9 15 19 15   Temp:      TempSrc:      SpO2: 95% 99% 96% 98%  Weight:      Height:       Neurology awake and alert ENT with no pallor Cardiovascular with S1 and S2 present and regular  Respiratory with no rales or wheezing, no rhonchi  Abdomen with no distention  No lower extremity edema  Data Reviewed:    Family Communication: no family at the bedside   Disposition: Status is: Inpatient  Remains inpatient appropriate because: sp cardiac catheterization   Planned Discharge Destination: Home    Author: Elidia Toribio Furnace, MD 07/28/2024 4:14 PM  For on call review [email or web address].

## 2024-07-28 NOTE — Progress Notes (Signed)
°  Attempted to round on patient this morning with patient already left to [facility] for cardiac cath at this time- -Will attempt to see patient again when she returns from [facility] after left heart cath Rendall Carwin, MD

## 2024-07-28 NOTE — Hospital Course (Signed)
 Rachel Odonnell was admitted to the hospital with the working diagnosis of unstable angina   68 yo female with the past medical history of coronary artery disease, sp stenting and CABG, atrial fibrillation, hypertension, hyperlipidemia, T2DM and obesity class 2 who presented with bilateral jaw pain and exertional dyspnea.  24 hrs prior to admission, patient developed acute symptoms on exertion, consistent with bilateral jaw pain, associated with dyspnea, that improved with resting. On the day of admission she was evaluated at the warfarin clinic, and decision was made to refer her to the ED.  On her initial physical examination her blood pressure was 151/75, HR 69, RR 15 and 02 saturation 96% Lungs with no wheezing or rhonchi, heart with S1 and S2 present and regular with no gallops, rubs or murmurs, abdomen with no distention and no lower extremity edema.   Na 138, K 4.3 Cl 102 bicarbonate 23 glucose 144 bun 20 cr 1,23 BNP 235 High sensitive troponin 17 and 15  Wbc 9,0 hgb 14.4 plt 335   Chest radiograph with hypoinflation, with positive cardiomegaly, no infiltrates or effusions, sternotomy wires in place.   EKG 56 bpm, normal axis, normal intervals, qtc 479, sinus rhythm with 1st degree AV block, with no significant ST segment changes, negative T wave lead I, aVL, lead II, III, aVF, V4 to V6. (New lateral T wave changes)   Patient was placed on aspirin , statin and isosorbide .   12/12 transferred from [facility] to [facility] for cardiac catheterization with coronary angiography. Diffuse coronary artery disease, underwent successful PTCA/DES x1 proximal to mid RCA.  12/13 no further chest pain, plan to continue antiplatelet therapy and follow up as outpatient.

## 2024-07-28 NOTE — Assessment & Plan Note (Signed)
Old records personally reviewed, neurology follow up from 05/2021. Seropositive ocular myasthenia gravis, no significant bulbar, or limb weakness noted.  He is not longer on Mestinon or prednisone, never treated with long term steroids sparing agent.   

## 2024-07-28 NOTE — Consult Note (Signed)
 PHARMACY - ANTICOAGULATION CONSULT NOTE  Pharmacy Consult for heparin - holding warfarin Indication: atrial fibrillation  Allergies  Allergen Reactions   Atomoxetine Nausea And Vomiting   Azithromycin Hives   Lisinopril Nausea And Vomiting   Atorvastatin Other (See Comments)    Restless legs   Codeine Nausea Only    Patient Measurements: Height: 5' 4 (162.6 cm) Weight: 99.1 kg (218 lb 7.6 oz) IBW/kg (Calculated) : 54.7 HEPARIN  DW (KG): 76.7  Vital Signs: Temp: 97.8 F (36.6 C) (12/12 0520) Temp Source: Oral (12/12 0520) BP: 101/62 (12/12 0520) Pulse Rate: 62 (12/12 0520)  Labs: Recent Labs    07/25/24 1521 07/26/24 0158 07/26/24 1640 07/27/24 0748 07/27/24 1657 07/28/24 0542  HGB 14.4 13.4  --  13.9  --  13.9  HCT 45.2 42.1  --  43.6  --  42.9  PLT 335 306  --  296  --  312  LABPROT 18.5* 19.0*  --  22.7*  --  22.5*  INR 1.5* 1.5*  --  1.9*  --  1.9*  HEPARINUNFRC  --   --    < > <0.10* 0.38 0.41  CREATININE 1.23* 1.15*  --   --   --   --    < > = values in this interval not displayed.    Estimated Creatinine Clearance: 52.8 mL/min (A) (by C-G formula based on SCr of 1.15 mg/dL (H)).   Medical History: Past Medical History:  Diagnosis Date   ADD (attention deficit disorder)    CAD (coronary artery disease)    Diabetes mellitus without complication (HCC)    Hypertension    PAF (paroxysmal atrial fibrillation) (HCC)     Medications:  Warfarin 5mg  every Tuesday and 2.5mg  all other days for the week (20mg /week) TTR 57.5%  Assessment: Patient presents to ED with chest pain, and shortness of breath. INR Subtherapeutic on admission. Pharmacy consulted to hold warfarin and dose heparin  for potential cardiac cath.  HL 0.38> 0.41- therapeutic- no issues noted INR 1.5> 1.5>1.9> 1.9 CBC WNL  Goal of Therapy:  INR 2-3 Monitor platelets by anticoagulation protocol: Yes   Plan:  Continue heparin  infusion at 1550 units/hr Monitor heparin  level daily in  am Continue to monitor H&H and platelets F/U plan  Cherlyn Boers, BS Pharm D, BCPS Clinical Pharmacist 07/28/2024 8:00 AM

## 2024-07-28 NOTE — Assessment & Plan Note (Signed)
 Patient has been on warfarin for anticoagulation No AV blocking agents.

## 2024-07-28 NOTE — Assessment & Plan Note (Signed)
 Uncontrolled with hyperglycemia, 04/2024 Hgb A1c at 9.2  Patient was placed on insulin  sliding scale for glucose cover and monitoring  Her glucose remained stable during her hospitalization with a fasting glucose on the day of discharge of 143/ mg/dl   At discharge will resume her insulin  therapy and SGLT 2 inh.  Semaglutide  and metformin    Continue with statin and fenofibrate .   Diabetic neuropathy, continue with gabapentin .

## 2024-07-28 NOTE — Assessment & Plan Note (Addendum)
 Cpap  Patient has bee on modafinil .

## 2024-07-28 NOTE — Assessment & Plan Note (Signed)
Calculated BMI is 37.5

## 2024-07-28 NOTE — Assessment & Plan Note (Signed)
 Continue levothyroxine 

## 2024-07-28 NOTE — Interval H&P Note (Signed)
 History and Physical Interval Note:  07/28/2024 10:30 AM  Rachel Odonnell  has presented today for surgery, with the diagnosis of unstable angina.  The various methods of treatment have been discussed with the patient and family. After consideration of risks, benefits and other options for treatment, the patient has consented to  Procedures: LEFT HEART CATH AND CORS/GRAFTS ANGIOGRAPHY (N/A) as a surgical intervention.  The patient's history has been reviewed, patient examined, no change in status, stable for surgery.  I have reviewed the patient's chart and labs.  Questions were answered to the patient's satisfaction.    Cath Lab Visit (complete for each Cath Lab visit)  Clinical Evaluation Leading to the Procedure:   ACS: No.  Non-ACS:    Anginal Classification: CCS III  Anti-ischemic medical therapy: No Therapy  Non-Invasive Test Results: No non-invasive testing performed  Prior CABG: Previous CABG        Lonni Cash

## 2024-07-28 NOTE — Assessment & Plan Note (Addendum)
 Unstable angina,   Echocardiogram with very distal apical hypokinesis, LV systolic function preserved with EF 60 to 65%, mild LVH, grade I diastolic dysfunction with impaired relaxation, RV systolic function preserved, LA and RA with normal size, severe aortic valve annular calcification, with mean gradient 7.0 mmHg, with no signs of aortic stenosis.   Cardiac catheterization with severe restenosis proximal LAD stented segment. Patent LIMA to LAD. Stable moderate obtuse marginal branch stenosis. Unchanged from last two catheterizations.  Severe stenosis in the proximal RCA.   Successful PTCA/DES x1 proximal to mid RCA Normal LV systolic function.   Plan to continue dual antiplatelet therapy with aspirin  and clopidogrel  for at least 30 days, then stop aspirin  and continue warfarin and clopidogrel .  Continue isosorbide .

## 2024-07-28 NOTE — Plan of Care (Signed)

## 2024-07-29 ENCOUNTER — Other Ambulatory Visit (HOSPITAL_COMMUNITY): Payer: Self-pay

## 2024-07-29 ENCOUNTER — Encounter (HOSPITAL_COMMUNITY): Payer: Self-pay | Admitting: Cardiovascular Disease

## 2024-07-29 DIAGNOSIS — I200 Unstable angina: Secondary | ICD-10-CM

## 2024-07-29 DIAGNOSIS — F32A Depression, unspecified: Secondary | ICD-10-CM

## 2024-07-29 LAB — CBC
HCT: 41.4 % (ref 36.0–46.0)
Hemoglobin: 13.7 g/dL (ref 12.0–15.0)
MCH: 29.5 pg (ref 26.0–34.0)
MCHC: 33.1 g/dL (ref 30.0–36.0)
MCV: 89.2 fL (ref 80.0–100.0)
Platelets: 289 K/uL (ref 150–400)
RBC: 4.64 MIL/uL (ref 3.87–5.11)
RDW: 13.8 % (ref 11.5–15.5)
WBC: 8.5 K/uL (ref 4.0–10.5)
nRBC: 0 % (ref 0.0–0.2)

## 2024-07-29 LAB — BASIC METABOLIC PANEL WITH GFR
Anion gap: 6 (ref 5–15)
BUN: 16 mg/dL (ref 8–23)
CO2: 27 mmol/L (ref 22–32)
Calcium: 10.1 mg/dL (ref 8.9–10.3)
Chloride: 103 mmol/L (ref 98–111)
Creatinine, Ser: 1.16 mg/dL — ABNORMAL HIGH (ref 0.44–1.00)
GFR, Estimated: 51 mL/min — ABNORMAL LOW (ref >60)
Glucose, Bld: 143 mg/dL — ABNORMAL HIGH (ref 70–99)
Potassium: 4.4 mmol/L (ref 3.5–5.1)
Sodium: 136 mmol/L (ref 135–145)

## 2024-07-29 LAB — PROTIME-INR
INR: 1.5 — ABNORMAL HIGH (ref 0.8–1.2)
Prothrombin Time: 19 s — ABNORMAL HIGH (ref 11.4–15.2)

## 2024-07-29 LAB — GLUCOSE, CAPILLARY: Glucose-Capillary: 156 mg/dL — ABNORMAL HIGH (ref 70–99)

## 2024-07-29 MED ORDER — CLOPIDOGREL BISULFATE 75 MG PO TABS
75.0000 mg | ORAL_TABLET | Freq: Every day | ORAL | 0 refills | Status: DC
  Filled 2024-07-29: qty 30, 30d supply, fill #0

## 2024-07-29 MED ORDER — ASPIRIN 81 MG PO CHEW
81.0000 mg | CHEWABLE_TABLET | Freq: Every day | ORAL | 0 refills | Status: AC
  Filled 2024-07-29: qty 30, 30d supply, fill #0

## 2024-07-29 MED ORDER — ISOSORBIDE MONONITRATE ER 30 MG PO TB24
90.0000 mg | ORAL_TABLET | Freq: Every day | ORAL | 0 refills | Status: DC
  Filled 2024-07-29: qty 90, 30d supply, fill #0

## 2024-07-29 NOTE — Progress Notes (Signed)
 Rounding Note   Patient Name: Rachel Odonnell Date of Encounter: 07/29/2024  [facility] Cardiologist: Jayson Sierras, MD   Subjective No CP or dyspnea  Scheduled Meds:  aspirin   81 mg Oral Daily   busPIRone   5 mg Oral QHS   citalopram   20 mg Oral QPM   clopidogrel   75 mg Oral Q breakfast   fenofibrate   160 mg Oral QHS   gabapentin   100 mg Oral QHS   insulin  aspart  0-5 Units Subcutaneous QHS   insulin  aspart  0-9 Units Subcutaneous TID WC   insulin  glargine  3 Units Subcutaneous BID   isosorbide  mononitrate  90 mg Oral Daily   levothyroxine   50 mcg Oral QAC breakfast   montelukast   10 mg Oral QHS   pantoprazole   80 mg Oral QHS   rOPINIRole   2 mg Oral QHS   rosuvastatin   40 mg Oral QPM   sodium chloride  flush  3 mL Intravenous Q12H   Continuous Infusions:  sodium chloride      PRN Meds: sodium chloride , acetaminophen , melatonin, polyethylene glycol, prochlorperazine , sodium chloride  flush   Vital Signs  Vitals:   07/28/24 1708 07/28/24 2025 07/28/24 2343 07/29/24 0534  BP: (!) 112/55 (!) 154/55 (!) 119/55 (!) 113/54  Pulse: (!) 58 67 (!) 58 (!) 56  Resp: 16 16 16 16   Temp: (!) 97.5 F (36.4 C) 98.7 F (37.1 C) 98.3 F (36.8 C) 98.4 F (36.9 C)  TempSrc: Oral Oral Oral Oral  SpO2: 99% 93% 93% 96%  Weight:      Height:        Intake/Output Summary (Last 24 hours) at 07/29/2024 0603 Last data filed at 07/28/2024 2100 Gross per 24 hour  Intake 1144.99 ml  Output 650 ml  Net 494.99 ml      07/25/2024    8:03 PM 07/25/2024    3:18 PM 06/28/2024    3:22 PM  Last 3 Weights  Weight (lbs) 218 lb 7.6 oz 211 lb 10.3 oz 213 lb 3 oz  Weight (kg) 99.1 kg 96 kg 96.7 kg      Telemetry Sinus - Personally Reviewed  Physical Exam  GEN: No acute distress.   Neck: No JVD Cardiac: RRR, 2/6 systolic murmur left sternal border. Respiratory: Clear to auscultation bilaterally. GI: Soft, nontender, non-distended  MS: No edema; femoral cath site with no  hematoma and no bruit. Neuro:  Nonfocal  Psych: Normal affect   Labs  Recent Labs  Lab 07/25/24 1521 07/25/24 1732 07/26/24 0158 07/26/24 0404  TRNPT 17 15 16 18        Chemistry Recent Labs  Lab 07/25/24 1521 07/26/24 0158 07/29/24 0350  NA 138 138 136  K 4.3 4.7 4.4  CL 102 101 103  CO2 23 31 27   GLUCOSE 144* 163* 143*  BUN 20 19 16   CREATININE 1.23* 1.15* 1.16*  CALCIUM  10.6* 9.7 10.1  MG  --  2.6*  --   GFRNONAA 47* 51* 51*  ANIONGAP 14 6 6      Hematology Recent Labs  Lab 07/27/24 0748 07/28/24 0542 07/29/24 0350  WBC 7.7 7.3 8.5  RBC 4.72 4.75 4.64  HGB 13.9 13.9 13.7  HCT 43.6 42.9 41.4  MCV 92.4 90.3 89.2  MCH 29.4 29.3 29.5  MCHC 31.9 32.4 33.1  RDW 13.4 13.6 13.8  PLT 296 312 289    BNP Recent Labs  Lab 07/25/24 1521  PROBNP 235.0      Radiology  CARDIAC CATHETERIZATION Result  Date: 07/28/2024   1st Diag lesion is 60% stenosed.   1st Mrg lesion is 60% stenosed.   Prox LAD lesion is 95% stenosed.   Prox RCA lesion is 99% stenosed.   A drug-eluting stent was successfully placed using a STENT ONYX FRONTIER [id number].   Post intervention, there is a 0% residual stenosis.   LIMA graft was visualized by angiography and is normal in caliber. Severe restenosis proximal LAD stented segment. Patent LIMA to LAD Stable moderate obtuse marginal branch stenosis. Unchanged from last two caths. Severe stenosis in the proximal RCA Successful PTCA/DES x 1 proximal to mid RCA Normal LV systolic function Recommendations: Continue DAPT with ASA and Plavix  for at least six months. Discharge home tomorrow.    Patient Profile   68 y.o. female w/ PMH of CAD (s/p DES to mid-LAD in 03/2023 and occlusion of diagonal jailed by stent and treated with POBA, s/p NSTEMI in 04/2024 with cath showing ISR of proximal LAD stent and underwent off-pump CABG on 05/06/2024 with LIMA-LAD), aortic stenosis, paroxysmal atrial fibrillation, HTN, HLD and OSA who is currently admitted for  evaluation of jaw pain.  Echocardiogram December 10 showed ejection fraction 60 to 65% with apical hypokinesis, mild left ventricular hypertrophy, grade 1 diastolic dysfunction.  Cardiac catheterization December 12 showed 60% first diagonal, 60% first marginal, 95% proximal LAD, 99% proximal RCA, patent LIMA to the LAD.  Patient had PCI of the right coronary artery.  Assessment & Plan  1 coronary artery disease-status post PCI of the right coronary artery-will continue aspirin  and Plavix  for 30 days and then discontinue aspirin  given need for Coumadin .  Continue statin.  2 paroxysmal atrial fibrillation-resume home dose of Coumadin  at discharge.  Will arrange to have INR checked in 5 days.  3 hyperlipidemia-continue statin.  4 history of aortic stenosis-patient with AS murmur on examination but no significant gradient on echocardiogram.  Will need follow-up as an outpatient.  5 hypertension-continue present blood pressure medications.  Patient can be discharged from a cardiac standpoint.  We will arrange follow-up with APP in 2 weeks in [place] and follow-up with Dr. Debera in 3 months.  For questions or updates, please contact [facility] Please consult [email or web address] for contact info under     Signed, Redell Shallow, MD  07/29/2024, 6:03 AM

## 2024-07-29 NOTE — Progress Notes (Signed)
 CARDIAC REHAB PHASE I   [id number] Patient ready for discharge had already walked independently in the hallway. Patient has stent card. Reviewed heart healthy diet information with the patient. Reviewed sublingual nitroglycerin , the importance of taking plavix . When to call 911. Patient was already enrolled in cardiac rehab post [facility]. Patient know she will need clearance from [facility] cardiology to resume outpatient program.   Hadassah Elpidio Quan RN

## 2024-07-29 NOTE — Assessment & Plan Note (Signed)
 Continue with citalopram  and buspirone  Patient has been on donepezil  as well.

## 2024-07-29 NOTE — Plan of Care (Signed)

## 2024-07-29 NOTE — Discharge Summary (Signed)
 Physician Discharge Summary   Patient: Rachel Odonnell MRN: [id number] DOB: 1956/04/10  Admit date:     07/25/2024  Discharge date: 07/29/2024  Discharge Physician: Elidia Sieving Merdis Snodgrass   PCP: Skillman, Katherine E, PA-C   Recommendations at discharge:    Patient will continue taking dual antiplatelet therapy with aspirin  and clopidogrel  for 30 days, then continue with clopidogrel  alone. Continue warfarin for anticoagulation Added isosorbide  for coronary artery disease Follow up with Comer Raymond PA-C in 7 to 10 days Follow up with Cardiology as scheduled.   Discharge Diagnoses: Principal Problem:   CAD (coronary artery disease) Active Problems:   Essential hypertension, benign   Paroxysmal atrial fibrillation (HCC)   Type 2 diabetes mellitus with hyperlipidemia (HCC)   Centrilobular emphysema (HCC)   Obstructive sleep apnea   Hypothyroidism   Obesity, class 2  Resolved Problems:   * No resolved hospital problems. [facility] Course: Mrs. Rachel Odonnell was admitted to the hospital with the working diagnosis of unstable angina   68 yo female with the past medical history of coronary artery disease, sp stenting and CABG, atrial fibrillation, hypertension, hyperlipidemia, T2DM and obesity class 2 who presented with bilateral jaw pain and exertional dyspnea.  24 hrs prior to admission, patient developed acute symptoms on exertion, consistent with bilateral jaw pain, associated with dyspnea, that improved with resting. On the day of admission she was evaluated at the warfarin clinic, and decision was made to refer her to the ED.  On her initial physical examination her blood pressure was 151/75, HR 69, RR 15 and 02 saturation 96% Lungs with no wheezing or rhonchi, heart with S1 and S2 present and regular with no gallops, rubs or murmurs, abdomen with no distention and no lower extremity edema.   Na 138, K 4.3 Cl 102 bicarbonate 23 glucose 144 bun 20 cr 1,23 BNP 235 High sensitive  troponin 17 and 15  Wbc 9,0 hgb 14.4 plt 335   Chest radiograph with hypoinflation, with positive cardiomegaly, no infiltrates or effusions, sternotomy wires in place.   EKG 56 bpm, normal axis, normal intervals, qtc 479, sinus rhythm with 1st degree AV block, with no significant ST segment changes, negative T wave lead I, aVL, lead II, III, aVF, V4 to V6. (New lateral T wave changes)   Patient was placed on aspirin , statin and isosorbide .   12/12 transferred from [facility] to [facility] for cardiac catheterization with coronary angiography. Diffuse coronary artery disease, underwent successful PTCA/DES x1 proximal to mid RCA.  12/13 no further chest pain, plan to continue antiplatelet therapy and follow up as outpatient.    Assessment and Plan: * CAD (coronary artery disease) Unstable angina,   Echocardiogram with very distal apical hypokinesis, LV systolic function preserved with EF 60 to 65%, mild LVH, grade I diastolic dysfunction with impaired relaxation, RV systolic function preserved, LA and RA with normal size, severe aortic valve annular calcification, with mean gradient 7.0 mmHg, with no signs of aortic stenosis.   Cardiac catheterization with severe restenosis proximal LAD stented segment. Patent LIMA to LAD. Stable moderate obtuse marginal branch stenosis. Unchanged from last two catheterizations.  Severe stenosis in the proximal RCA.   Successful PTCA/DES x1 proximal to mid RCA Normal LV systolic function.   Plan to continue dual antiplatelet therapy with aspirin  and clopidogrel  for at least 30 days, then stop aspirin  and continue warfarin and clopidogrel .  Continue isosorbide .   Essential hypertension, benign Continue blood pressure monitoring   Paroxysmal atrial fibrillation [facility]) Patient  has been on warfarin for anticoagulation Discharge INR is 1,5  No AV blocking agents.   Type 2 diabetes mellitus with hyperlipidemia (HCC) Uncontrolled with hyperglycemia, 04/2024 Hgb A1c at 9.2   Patient was placed on insulin  sliding scale for glucose cover and monitoring  Her glucose remained stable during her hospitalization with a fasting glucose on the day of discharge of 143/ mg/dl   At discharge will resume her insulin  therapy and SGLT 2 inh.  Semaglutide  and metformin    Continue with statin and fenofibrate .   Diabetic neuropathy, continue with gabapentin .   Centrilobular emphysema (HCC) No signs of acute exacerbation  Obstructive sleep apnea Cpap  Patient has bee on modafinil .   Hypothyroidism Continue levothyroxine    Depression Continue with citalopram  and buspirone  Patient has been on donepezil  as well.   Obesity, class 2 Calculated BMI is 37.5        Consultants: Cardiology  Procedures performed: cardiac catheterization and PCI   Disposition: Home Diet recommendation:  Cardiac and Carb modified diet DISCHARGE MEDICATION: Allergies as of 07/29/2024       Reactions   Atomoxetine Nausea And Vomiting   Azithromycin Hives   Lisinopril Nausea And Vomiting   Atorvastatin Other (See Comments)   Restless legs   Codeine Nausea Only        Medication List     STOP taking these medications    aspirin  EC 325 MG tablet Replaced by: aspirin  81 MG chewable tablet       TAKE these medications    rOPINIRole  2 MG tablet Commonly known as: REQUIP  Take 2 mg by mouth at bedtime. The timing of this medication is very important.   Accu-Chek Softclix Lancets lancets Use as instructed to test blood glucose four times daily   aspirin  81 MG chewable tablet Chew 1 tablet (81 mg total) by mouth daily. Replaces: aspirin  EC 325 MG tablet   B-D ULTRAFINE III SHORT PEN 31G X 8 MM Misc Generic drug: Insulin  Pen Needle 1 each by Does not apply route as directed.   busPIRone  5 MG tablet Commonly known as: BUSPAR  Take 5 mg by mouth at bedtime.   citalopram  20 MG tablet Commonly known as: CELEXA  Take 1 tablet (20 mg total) by mouth every evening.    clopidogrel  75 MG tablet Commonly known as: PLAVIX  Take 1 tablet (75 mg total) by mouth daily with breakfast. Start taking on: July 30, 2024   donepezil  5 MG tablet Commonly known as: ARICEPT  Take 5 mg by mouth every evening.   empagliflozin  25 MG Tabs tablet Commonly known as: Jardiance  Take 1 tablet (25 mg total) by mouth daily. What changed: when to take this   estradiol  1 MG tablet Commonly known as: ESTRACE  Take 1 mg by mouth at bedtime.   fenofibrate  160 MG tablet Take 1 tablet (160 mg total) by mouth daily. What changed: when to take this   FreeStyle Libre 2 Reader Espiridion USE TO CHECK GLUCOSE AS DIRECTED   FreeStyle Libre 2 Sensor Misc CHANGE SENSOR EVERY 14 DAYS USE  TO CHECK BLOOD GLUCOSE  CONTINUOUSLY   gabapentin  100 MG capsule Commonly known as: Neurontin  Take 1 capsule (100 mg total) by mouth at bedtime.   Gemtesa  75 MG Tabs Generic drug: Vibegron  Take 1 tablet (75 mg total) by mouth daily.   glucose blood test strip 1 each by Other route as needed. Use as instructed to test blood glucose four times daily   HumaLOG  KwikPen 100 UNIT/ML KwikPen Generic drug: insulin   lispro INJECT SUBCUTANEOUSLY 16 TO 22  UNITS INTO THE SKIN 3 TIMES  DAILY BEFORE MEALS What changed: See the new instructions.   isosorbide  mononitrate 30 MG 24 hr tablet Commonly known as: IMDUR  Take 3 tablets (90 mg total) by mouth daily.   levocetirizine 5 MG tablet Commonly known as: XYZAL  Take 5 mg by mouth every evening.   levothyroxine  50 MCG tablet Commonly known as: SYNTHROID  TAKE 1 TABLET BY MOUTH DAILY  BEFORE BREAKFAST What changed: when to take this   Magnesium  Oxide 400 240 MG Pack Generic drug: Magnesium  Oxide Take 400 mg by mouth daily. What changed: when to take this   metFORMIN  500 MG tablet Commonly known as: GLUCOPHAGE  TAKE 1 TABLET BY MOUTH DAILY  WITH BREAKFAST What changed: when to take this   modafinil  100 MG tablet Commonly known as: PROVIGIL  Take  1 tablet (100 mg total) by mouth daily.   montelukast  10 MG tablet Commonly known as: SINGULAIR  Take 10 mg by mouth at bedtime.   OVER THE COUNTER MEDICATION Take 2 capsules by mouth at bedtime. Omega XL   Ozempic  (0.25 or 0.5 MG/DOSE) 2 MG/1.5ML Sopn Generic drug: Semaglutide (0.25 or 0.5MG /DOS) Inject 0.5 mg into the skin every Thursday.   pantoprazole  40 MG tablet Commonly known as: PROTONIX  TAKE 2 TABLETS EVERY DAY What changed: when to take this   rosuvastatin  40 MG tablet Commonly known as: Crestor  Take 1 tablet (40 mg total) by mouth daily. What changed: when to take this   Tresiba  FlexTouch 200 UNIT/ML FlexTouch Pen Generic drug: insulin  degludec Inject 110 Units into the skin at bedtime.   Vitamin D3 125 MCG (5000 UT) Caps Take 1 capsule (5,000 Units total) by mouth daily. What changed: when to take this   warfarin 5 MG tablet Commonly known as: COUMADIN  Take as directed. If you are unsure how to take this medication, talk to your nurse or doctor. Original instructions: TAKE ONE-HALF TABLET TO 1 TABLET BY MOUTH  DAILY OR AS DIRECTED What changed:  how much to take how to take this when to take this additional instructions        Follow-up Information     Miriam Norris, NP Follow up.   Specialty: Cardiology Why: Follow-up with Cardiology scheduled for 08/11/2024 at 11:20am. Please arrive 15 minutes early for check-in. If this date/ time does not work for you, please call our office to reschedule. Contact information: [address] [place] [address] [phone]         [facility] Follow up.   Specialty: Cardiology Why: Our office will call you to schedule an appointment to have your INR checked. Contact information: [address] [place]  [address] [phone]               Discharge Exam: Fredricka Weights   07/25/24 1518 07/25/24 2003  Weight: 96 kg 99.1 kg   BP (!) 125/59 (BP  Location: Right Arm)   Pulse 62   Temp 98.6 F (37 C) (Oral)   Resp 16   Ht 5' 4 (1.626 m)   Wt 99.1 kg   SpO2 94%   BMI 37.50 kg/m   Patient is feeling better, no chest pain and no dyspnea, no lower extremity edema, PND or orthopnea  Neurology awake and alert ENT with mild pallor with no icterus Cardiovascular with S1 and S2 present and regular with no gallops or rubs, positive systolic murmur crescendo decrescendo at  the base.  Respiratory with no rales or wheezing, no rhonchi  Abdomen with no distention  No lower extremity edema   Condition at discharge: stable  The results of significant diagnostics from this hospitalization (including imaging, microbiology, ancillary and laboratory) are listed below for reference.   Imaging Studies: CARDIAC CATHETERIZATION Result Date: 07/28/2024   1st Diag lesion is 60% stenosed.   1st Mrg lesion is 60% stenosed.   Prox LAD lesion is 95% stenosed.   Prox RCA lesion is 99% stenosed.   A drug-eluting stent was successfully placed using a STENT ONYX FRONTIER [id number].   Post intervention, there is a 0% residual stenosis.   LIMA graft was visualized by angiography and is normal in caliber. Severe restenosis proximal LAD stented segment. Patent LIMA to LAD Stable moderate obtuse marginal branch stenosis. Unchanged from last two caths. Severe stenosis in the proximal RCA Successful PTCA/DES x 1 proximal to mid RCA Normal LV systolic function Recommendations: Continue DAPT with ASA and Plavix  for at least six months. Discharge home tomorrow.   ECHOCARDIOGRAM COMPLETE Result Date: 07/26/2024    ECHOCARDIOGRAM REPORT   Patient Name:   MARCEY PERSAD Grabe Date of Exam: 07/26/2024 Medical Rec #:  [id number]     Height:       64.0 in Accession #:    [id number]    Weight:       218.5 lb Date of Birth:  1956-03-24    BSA:          2.031 m Patient Age:    68 years      BP:           119/49 mmHg Patient Gender: F             HR:           52 bpm. Exam Location:  [facility] Procedure: 2D Echo, Cardiac Doppler, Color Doppler and Intracardiac            Opacification Agent (Both Spectral and Color Flow Doppler were            utilized during procedure). Indications:    Chest Pain  History:        Patient has prior history of Echocardiogram examinations, most                 recent 05/02/2024. Angina, CAD and Previous Myocardial                 Infarction, Prior CABG, COPD, Arrythmias:Atrial Fibrillation,                 Signs/Symptoms:Chest Pain and Hypotension; Risk                 Factors:Hypertension, Sleep Apnea, Dyslipidemia and Diabetes.  Sonographer:    Juliene Rucks Referring Phys: [id number] TERRY LOISE HURST  Sonographer Comments: Patient is obese. Image acquisition challenging due to COPD. IMPRESSIONS  1. Very distal apical hypokinesis noted with use of echocontrast. Left ventricular ejection fraction, by estimation, is 60 to 65%. The left ventricle has normal function. The left ventricle has no regional wall motion abnormalities. There is mild left ventricular hypertrophy. Left ventricular diastolic parameters are consistent with Grade I diastolic dysfunction (impaired relaxation).  2. Right ventricular systolic function is normal. The right ventricular size is normal.  3. The mitral valve is normal in structure. No evidence of mitral valve regurgitation. No evidence of mitral stenosis.  4. The aortic valve is tricuspid. There is severe calcifcation of  the aortic valve. There is severe thickening of the aortic valve. Aortic valve regurgitation is not visualized. No aortic stenosis is present.  5. The inferior vena cava is normal in size with greater than 50% respiratory variability, suggesting right atrial pressure of 3 mmHg. FINDINGS  Left Ventricle: Very distal apical hypokinesis noted with use of echocontrast. Left ventricular ejection fraction, by estimation, is 60 to 65%. The left ventricle has normal function. The left ventricle has no regional wall motion abnormalities.  Definity  contrast agent was given IV to delineate the left ventricular endocardial borders. The left ventricular internal cavity size was normal in size. There is mild left ventricular hypertrophy. Left ventricular diastolic parameters are consistent with Grade I diastolic dysfunction (impaired relaxation). Normal left ventricular filling pressure. Right Ventricle: The right ventricular size is normal. Right vetricular wall thickness was not well visualized. Right ventricular systolic function is normal. Left Atrium: Left atrial size was normal in size. Right Atrium: Right atrial size was normal in size. Pericardium: There is no evidence of pericardial effusion. Mitral Valve: The mitral valve is normal in structure. There is mild thickening of the mitral valve leaflet(s). There is mild calcification of the mitral valve leaflet(s). Mild mitral annular calcification. No evidence of mitral valve regurgitation. No evidence of mitral valve stenosis. Tricuspid Valve: The tricuspid valve is normal in structure. Tricuspid valve regurgitation is not demonstrated. No evidence of tricuspid stenosis. Aortic Valve: The aortic valve is tricuspid. There is severe calcifcation of the aortic valve. There is severe thickening of the aortic valve. There is severe aortic valve annular calcification. Aortic valve regurgitation is not visualized. No aortic stenosis is present. Aortic valve mean gradient measures 7.0 mmHg. Aortic valve peak gradient measures 20.1 mmHg. Aortic valve area, by VTI measures 1.78 cm. Pulmonic Valve: The pulmonic valve was not well visualized. Pulmonic valve regurgitation is not visualized. No evidence of pulmonic stenosis. Aorta: The aortic root is normal in size and structure. Venous: The inferior vena cava is normal in size with greater than 50% respiratory variability, suggesting right atrial pressure of 3 mmHg. IAS/Shunts: No atrial level shunt detected by color flow Doppler.  LEFT VENTRICLE PLAX 2D  LVIDd:         4.00 cm   Diastology LVIDs:         2.70 cm   LV e' medial:    4.91 cm/s LV PW:         1.10 cm   LV E/e' medial:  14.5 LV IVS:        1.30 cm   LV e' lateral:   7.89 cm/s LVOT diam:     1.90 cm   LV E/e' lateral: 9.0 LV SV:         64 LV SV Index:   31 LVOT Area:     2.84 cm  RIGHT VENTRICLE RV S prime:     8.06 cm/s TAPSE (M-mode): 1.2 cm LEFT ATRIUM             Index        RIGHT ATRIUM           Index LA diam:        3.70 cm 1.82 cm/m   RA Area:     14.10 cm LA Vol (A2C):   30.0 ml 14.77 ml/m  RA Volume:   29.80 ml  14.67 ml/m LA Vol (A4C):   44.6 ml 21.96 ml/m LA Biplane Vol: 36.7 ml 18.07 ml/m  AORTIC VALVE AV Area (  Vmax):    1.10 cm AV Area (Vmean):   1.53 cm AV Area (VTI):     1.78 cm AV Vmax:           224.00 cm/s AV Vmean:          113.000 cm/s AV VTI:            0.356 m AV Peak Grad:      20.1 mmHg AV Mean Grad:      7.0 mmHg LVOT Vmax:         87.00 cm/s LVOT Vmean:        60.800 cm/s LVOT VTI:          0.224 m LVOT/AV VTI ratio: 0.63  AORTA Ao Root diam: 2.70 cm MITRAL VALVE               TRICUSPID VALVE MV Area (PHT): 2.83 cm    TR Peak grad:   11.3 mmHg MV Decel Time: 268 msec    TR Vmax:        168.00 cm/s MV E velocity: 71.00 cm/s MV A velocity: 87.40 cm/s  SHUNTS MV E/A ratio:  0.81        Systemic VTI:  0.22 m                            Systemic Diam: 1.90 cm Dorn Ross MD Electronically signed by Dorn Ross MD Signature Date/Time: 07/26/2024/6:26:59 PM    Final    DG Chest Port 1 View Result Date: 07/25/2024 CLINICAL DATA:  Chest pain EXAM: PORTABLE CHEST 1 VIEW COMPARISON:  Prior chest x-ray 06/23/2024 FINDINGS: Patient is status post median sternotomy. Cardiac and mediastinal contours are unchanged. Lungs are clear save for mild chronic bronchitic changes. No overt edema, pleural effusion, pneumothorax or focal airspace infiltrate. No acute osseous abnormality. IMPRESSION: No active disease. Electronically Signed   By: Wilkie Lent M.D.   On:  07/25/2024 16:19    Microbiology: Results for orders placed or performed during the hospital encounter of 05/01/24  MRSA Next Gen by PCR, Nasal     Status: None   Collection Time: 05/02/24  6:39 PM   Specimen: Nasal Mucosa; Nasal Swab  Result Value Ref Range Status   MRSA by PCR Next Gen NOT DETECTED NOT DETECTED Final    Comment: (NOTE) The GeneXpert MRSA Assay (FDA approved for NASAL specimens only), is one component of a comprehensive MRSA colonization surveillance program. It is not intended to diagnose MRSA infection nor to guide or monitor treatment for MRSA infections. Test performance is not FDA approved in patients less than 98 years old. Performed at [facility], [address] [place], [place] [address]     Labs: CBC: Recent Labs  Lab 07/25/24 1521 07/26/24 0158 07/27/24 0748 07/28/24 0542 07/29/24 0350  WBC 9.0 9.1 7.7 7.3 8.5  HGB 14.4 13.4 13.9 13.9 13.7  HCT 45.2 42.1 43.6 42.9 41.4  MCV 90.6 91.3 92.4 90.3 89.2  PLT 335 306 296 312 289   Basic Metabolic Panel: Recent Labs  Lab 07/25/24 1521 07/26/24 0158 07/29/24 0350  NA 138 138 136  K 4.3 4.7 4.4  CL 102 101 103  CO2 23 31 27   GLUCOSE 144* 163* 143*  BUN 20 19 16   CREATININE 1.23* 1.15* 1.16*  CALCIUM  10.6* 9.7 10.1  MG  --  2.6*  --   PHOS  --  3.8  --  Liver Function Tests: No results for input(s): AST, ALT, ALKPHOS, BILITOT, PROT, ALBUMIN  in the last 168 hours. CBG: Recent Labs  Lab 07/28/24 0727 07/28/24 1501 07/28/24 1804 07/28/24 2101 07/29/24 0722  GLUCAP 176* 167* 175* 213* 156*    Discharge time spent: greater than 30 minutes.  Signed: Elidia Toribio Furnace, MD [facility] 07/29/2024

## 2024-07-30 LAB — POCT ACTIVATED CLOTTING TIME
Activated Clotting Time: 256 s
Activated Clotting Time: 307 s

## 2024-07-31 ENCOUNTER — Telehealth: Payer: Self-pay

## 2024-07-31 LAB — POCT ACTIVATED CLOTTING TIME
Activated Clotting Time: 179 s
Activated Clotting Time: 189 s
Activated Clotting Time: 163 s

## 2024-07-31 NOTE — Transitions of Care (Post Inpatient/ED Visit) (Signed)
 07/31/2024  Name: Rachel Odonnell MRN: [id number] DOB: June 27, 1956  Today's TOC FU Call Status: Today's TOC FU Call Status:: Successful TOC FU Call Completed TOC FU Call Complete Date: 07/31/24  Patient's Name and Date of Birth confirmed. Name, DOB  Transition Care Management Follow-up Telephone Call Date of Discharge: 07/29/24 Discharge Facility: [facility]) Type of Discharge: Inpatient Admission Primary Inpatient Discharge Diagnosis:: CAD How have you been since you were released from the hospital?: Better (I have better energy,  no CP) Any questions or concerns?: No  Items Reviewed: Did you receive and understand the discharge instructions provided?: Yes Medications obtained,verified, and reconciled?: Yes (Medications Reviewed) Any new allergies since your discharge?: No Dietary orders reviewed?: Yes Type of Diet Ordered:: low salt heart healthy diet Do you have support at home?: Yes People in Home [RPT]: spouse  Medications Reviewed Today: Medications Reviewed Today     Reviewed by Rumalda Alan PENNER, RN (Registered Nurse) on 07/31/24 at 1405  Med List Status: <None>   Medication Order Taking? Sig Documenting Provider Last Dose Status Informant  Accu-Chek Softclix Lancets lancets [id number] Yes Use as instructed to test blood glucose four times daily Nida, Gebreselassie W, MD  Active Self, Pharmacy Records, Spouse/Significant Other  aspirin  81 MG chewable tablet [id number] Yes Chew 1 tablet (81 mg total) by mouth daily. Arrien, Elidia Sieving, MD  Active   busPIRone  (BUSPAR ) 5 MG tablet [id number] Yes Take 5 mg by mouth at bedtime. [provider]  Active Self, Pharmacy Records, Spouse/Significant Other  Cholecalciferol  (VITAMIN D3) 125 MCG (5000 UT) CAPS [id number] Yes Take 1 capsule (5,000 Units total) by mouth daily. Nida, Gebreselassie W, MD  Active Self, Pharmacy Records, Spouse/Significant Other  citalopram  (CELEXA ) 20 MG tablet [id number] Yes Take 1 tablet (20 mg  total) by mouth every evening. Miriam Norris, NP  Active Self, Pharmacy Records, Spouse/Significant Other  clopidogrel  (PLAVIX ) 75 MG tablet [id number] Yes Take 1 tablet (75 mg total) by mouth daily with breakfast. Arrien, Elidia Sieving, MD  Active   Continuous Glucose Receiver (FREESTYLE LIBRE 2 READER) ESPIRIDION [id number] Yes USE TO CHECK GLUCOSE AS DIRECTED Lenis Gebreselassie W, MD  Active Self, Pharmacy Records, Spouse/Significant Other  Continuous Glucose Sensor (FREESTYLE LIBRE 2 SENSOR) MISC [id number] Yes CHANGE SENSOR EVERY 14 DAYS USE  TO CHECK BLOOD GLUCOSE  CONTINUOUSLY Nida, Ethelle ORN, MD  Active Self, Pharmacy Records, Spouse/Significant Other  donepezil  (ARICEPT ) 5 MG tablet [id number] Yes Take 5 mg by mouth every evening. [provider]  Active Self, Pharmacy Records, Spouse/Significant Other  empagliflozin  (JARDIANCE ) 25 MG TABS tablet [id number] Yes Take 1 tablet (25 mg total) by mouth daily. Nida, Gebreselassie W, MD  Active Self, Pharmacy Records, Spouse/Significant Other  estradiol  (ESTRACE ) 1 MG tablet [id number] Yes Take 1 mg by mouth at bedtime. [provider]  Active Self, Pharmacy Records, Spouse/Significant Other  fenofibrate  160 MG tablet [id number] Yes Take 1 tablet (160 mg total) by mouth daily. Barrett, Rocky SAUNDERS, PA-C  Active Self, Pharmacy Records, Spouse/Significant Other  gabapentin  (NEURONTIN ) 100 MG capsule [id number] Yes Take 1 capsule (100 mg total) by mouth at bedtime. Dunn, Dayna N, PA-C  Active Self, Pharmacy Records, Spouse/Significant Other  glucose blood test strip [id number] Yes 1 each by Other route as needed. Use as instructed to test blood glucose four times daily Nida, Gebreselassie W, MD  Active Self, Pharmacy Records, Spouse/Significant Other  HUMALOG  KWIKPEN 100 UNIT/ML KwikPen [id number] Yes INJECT SUBCUTANEOUSLY 16 TO 22  UNITS INTO THE SKIN 3  TIMES  DAILY BEFORE MEALS Nida, Gebreselassie W, MD  Active Self, Pharmacy Records,  Spouse/Significant Other  insulin  degludec (TRESIBA  FLEXTOUCH) 200 UNIT/ML FlexTouch Pen [id number] Yes Inject 110 Units into the skin at bedtime. Nida, Gebreselassie W, MD  Active Self, Pharmacy Records, Spouse/Significant Other  Insulin  Pen Needle (B-D ULTRAFINE III SHORT PEN) 31G X 8 MM MISC [id number] Yes 1 each by Does not apply route as directed. Nida, Gebreselassie W, MD  Active Self, Pharmacy Records, Spouse/Significant Other  isosorbide  mononitrate (IMDUR ) 30 MG 24 hr tablet [id number] Yes Take 3 tablets (90 mg total) by mouth daily. Arrien, Mauricio Daniel, MD  Active   levocetirizine (XYZAL ) 5 MG tablet [id number] Yes Take 5 mg by mouth every evening. [provider]  Active Self, Pharmacy Records, Spouse/Significant Other  levothyroxine  (SYNTHROID ) 50 MCG tablet [id number] Yes TAKE 1 TABLET BY MOUTH DAILY  BEFORE BREAKFAST Nida, Gebreselassie W, MD  Active Self, Pharmacy Records, Spouse/Significant Other  Magnesium  Oxide (MAGNESIUM  OXIDE 400) 240 MG PACK [id number] Yes Take 400 mg by mouth daily. Miriam Norris, NP  Active Self, Pharmacy Records, Spouse/Significant Other  metFORMIN  (GLUCOPHAGE ) 500 MG tablet [id number] Yes TAKE 1 TABLET BY MOUTH DAILY  WITH BREAKFAST Nida, Gebreselassie W, MD  Active Self, Pharmacy Records, Spouse/Significant Other  modafinil  (PROVIGIL ) 100 MG tablet [id number]  Take 1 tablet (100 mg total) by mouth daily.  Patient not taking: Reported on 07/31/2024   Jude Harden GAILS, MD  Active Self, Pharmacy Records, Spouse/Significant Other           Med Note DRENA, CHUCK KANDICE Debar Jul 25, 2024  4:18 PM) Helps pt stay awake during the day d/t sleeping during the day and night  montelukast  (SINGULAIR ) 10 MG tablet [id number] Yes Take 10 mg by mouth at bedtime. [provider]  Active Self, Pharmacy Records, Spouse/Significant Other  OVER THE COUNTER MEDICATION [id number] Yes Take 2 capsules by mouth at bedtime. Omega XL [provider]  Active Self,  Pharmacy Records, Spouse/Significant Other  pantoprazole  (PROTONIX ) 40 MG tablet [id number] Yes TAKE 2 TABLETS EVERY DAY Miriam Norris, NP  Active Self, Pharmacy Records, Spouse/Significant Other  rOPINIRole  (REQUIP ) 2 MG tablet [id number] Yes Take 2 mg by mouth at bedtime. [provider]  Active Self, Pharmacy Records, Spouse/Significant Other  rosuvastatin  (CRESTOR ) 40 MG tablet [id number] Yes Take 1 tablet (40 mg total) by mouth daily. Dunn, Dayna N, PA-C  Active Self, Pharmacy Records, Spouse/Significant Other  Semaglutide ,0.25 or 0.5MG /DOS, (OZEMPIC , 0.25 OR 0.5 MG/DOSE,) 2 MG/1.5ML SOPN [id number] Yes Inject 0.5 mg into the skin every Thursday.  Patient taking differently: Inject 0.5 mg into the skin every Thursday.   [provider]  Active Self, Pharmacy Records, Spouse/Significant Other  Vibegron  (GEMTESA ) 75 MG TABS [id number] Yes Take 1 tablet (75 mg total) by mouth daily. Gerldine Lauraine BROCKS, FNP  Active Self, Pharmacy Records, Spouse/Significant Other           Med Note DRENA, CHUCK KANDICE   Tue Jul 25, 2024  4:25 PM) Haven't had samples in a while (months) but pt needs a refill on this  warfarin (COUMADIN ) 5 MG tablet [id number] Yes TAKE ONE-HALF TABLET TO 1 TABLET BY MOUTH  DAILY OR AS DIRECTED Debera Jayson KANDICE, MD  Active Self, Pharmacy Records, Spouse/Significant Other  Med List Note (Ward, Angelica, CPhT 07/25/24 1627): Send long term meds to [facility] and send short term meds to [employer] [facility] in [place]  Home Care and Equipment/Supplies: Were Home Health Services Ordered?: No Any new equipment or medical supplies ordered?: No  Functional Questionnaire: Do you need assistance with bathing/showering or dressing?: No Do you need assistance with meal preparation?: No Do you need assistance with eating?: No Do you have difficulty maintaining continence: No Do you need assistance with getting out of bed/getting out of a chair/moving?: No Do you have difficulty  managing or taking your medications?: No  Follow up appointments reviewed: PCP Follow-up appointment confirmed?: No (will call today and make an appointment) MD Provider Line Number:[phone] Given: No Specialist Hospital Follow-up appointment confirmed?: Yes Date of Specialist follow-up appointment?: 08/11/24 Follow-Up Specialty Provider:: Cardiology Do you need transportation to your follow-up appointment?: No Do you understand care options if your condition(s) worsen?: Yes-patient verbalized understanding  SDOH Interventions Today    Flowsheet Row Most Recent Value  SDOH Interventions   Food Insecurity Interventions Intervention Not Indicated  Housing Interventions Intervention Not Indicated  Transportation Interventions Intervention Not Indicated  Utilities Interventions Intervention Not Indicated  Placed call to patient who reports that she is doing well. No Chest pain since hospital discharge. Reports that she had a TOC nurse come to see her from MD office.  Reviewed when to call 911, Reviewed risk of bleeding.   Reviewed importance of good DM control.    Reviewed s/s of chest pain.  Encouraged patient to call 911 for any chest pain of falls.  Reviewed with patient that cardiac rehab is on hold until cleared by cardiology. Reviewed importance of hospital follow up with PCP and patient reports she will call today and schedule.   Alan Ee, RN, BSN, CEN [employer]- Transition of Care Team.  [facility] [phone]

## 2024-08-01 ENCOUNTER — Encounter (HOSPITAL_COMMUNITY)

## 2024-08-02 ENCOUNTER — Ambulatory Visit: Admitting: Internal Medicine

## 2024-08-02 ENCOUNTER — Encounter: Payer: Self-pay | Admitting: Internal Medicine

## 2024-08-02 VITALS — BP 116/68 | HR 92 | Ht 64.0 in | Wt 213.0 lb

## 2024-08-02 DIAGNOSIS — G4733 Obstructive sleep apnea (adult) (pediatric): Secondary | ICD-10-CM | POA: Diagnosis not present

## 2024-08-02 DIAGNOSIS — Z87891 Personal history of nicotine dependence: Secondary | ICD-10-CM

## 2024-08-02 MED ORDER — MODAFINIL 100 MG PO TABS: 100.0000 mg | ORAL_TABLET | Freq: Every day | ORAL | 1 refills | Status: AC

## 2024-08-02 NOTE — Patient Instructions (Addendum)
 Will need referral to sleep medicine for narcolepsy and sleep apnea in [place].  Pulmonary clinic follow up is as needed

## 2024-08-02 NOTE — Progress Notes (Unsigned)
 Rachel Odonnell, female    DOB: 26-Sep-1955    MRN: [id number]   Brief patient profile:  65  yowm  quit smoking 1996  former [place] self-referred back to pulmonary clinic in [place]  08/02/2024  for post hosp f/u    PFTs 05/14/21 borderline for dx of asthma with main finding ERV 29% at wt 227    Admit date:     07/25/2024  Discharge date: 07/29/2024      Recommendations at discharge:     Patient will continue taking dual antiplatelet therapy with aspirin  and clopidogrel  for 30 days, then continue with clopidogrel  alone. Continue warfarin for anticoagulation Added isosorbide  for coronary artery disease Follow up with Rachel Raymond PA-C in 7 to 10 days Follow up with Cardiology as scheduled.    Discharge Diagnoses: Principal Problem:   CAD (coronary artery disease)   Essential hypertension, benign   Paroxysmal atrial fibrillation (HCC)   Type 2 diabetes mellitus with hyperlipidemia (HCC)   Centrilobular emphysema (HCC)   Obstructive sleep apnea   Hypothyroidism   Obesity, class 2     Hospital Course: Rachel Odonnell was admitted to the hospital with the working diagnosis of unstable angina    68 yo female with the past medical history of coronary artery disease, sp stenting and CABG, atrial fibrillation, hypertension, hyperlipidemia, T2DM and obesity class 2 who presented with bilateral jaw pain and exertional dyspnea.  24 hrs prior to admission, patient developed acute symptoms on exertion, consistent with bilateral jaw pain, associated with dyspnea, that improved with resting. On the day of admission she was evaluated at the warfarin clinic, and decision was made to refer her to the ED.  On her initial physical examination her blood pressure was 151/75, HR 69, RR 15 and 02 saturation 96% Lungs with no wheezing or rhonchi, heart with S1 and S2 present and regular with no gallops, rubs or murmurs, abdomen with no distention and no lower extremity edema.    Na 138, K 4.3  Cl 102 bicarbonate 23 glucose 144 bun 20 cr 1,23 BNP 235 High sensitive troponin 17 and 15  Wbc 9,0 hgb 14.4 plt 335    Chest radiograph with hypoinflation, with positive cardiomegaly, no infiltrates or effusions, sternotomy wires in place.    EKG 56 bpm, normal axis, normal intervals, qtc 479, sinus rhythm with 1st degree AV block, with no significant ST segment changes, negative T wave lead I, aVL, lead II, III, aVF, V4 to V6. (New lateral T wave changes)    Patient was placed on aspirin , statin and isosorbide .    12/12 transferred from [facility] to [facility] for cardiac catheterization with coronary angiography. Diffuse coronary artery disease, underwent successful PTCA/DES x1 proximal to mid RCA.  12/13 no further chest pain, plan to continue antiplatelet therapy and follow up as outpatient.      Assessment and Plan: * CAD (coronary artery disease) Unstable angina,    Echocardiogram with very distal apical hypokinesis, LV systolic function preserved with EF 60 to 65%, mild LVH, grade I diastolic dysfunction with impaired relaxation, RV systolic function preserved, LA and RA with normal size, severe aortic valve annular calcification, with mean gradient 7.0 mmHg, with no signs of aortic stenosis.    Cardiac catheterization with severe restenosis proximal LAD stented segment. Patent LIMA to LAD. Stable moderate obtuse marginal branch stenosis. Unchanged from last two catheterizations.  Severe stenosis in the proximal RCA.    Successful PTCA/DES x1 proximal to mid RCA Normal LV  systolic function.    Plan to continue dual antiplatelet therapy with aspirin  and clopidogrel  for at least 30 days, then stop aspirin  and continue warfarin and clopidogrel .  Continue isosorbide .    Essential hypertension, benign Continue blood pressure monitoring    Paroxysmal atrial fibrillation (HCC) Patient has been on warfarin for anticoagulation Discharge INR is 1,5  No AV blocking agents.    Type 2 diabetes  mellitus with hyperlipidemia (HCC) Uncontrolled with hyperglycemia, 04/2024 Hgb A1c at 9.2  Patient was placed on insulin  sliding scale for glucose cover and monitoring   Her glucose remained stable during her hospitalization with a fasting glucose on the day of discharge of 143/ mg/dl    At discharge will resume her insulin  therapy and SGLT 2 inh.  Semaglutide  and metformin     Continue with statin and fenofibrate .    Diabetic neuropathy, continue with gabapentin .    Centrilobular emphysema (HCC) No signs of acute exacerbation   Obstructive sleep apnea Cpap  Patient has been on modafinil .    Hypothyroidism Continue levothyroxine     Depression Continue with citalopram  and buspirone  Patient has been on donepezil  as well.    Obesity, class 2 Calculated BMI is 37.5        History of Present Illness  08/02/2024  Pulmonary/ 1st office eval/ Jeanise Durfey / [facility] off modafinil  x months, severe narcolepsy symptoms [facility] ethen  Chief Complaint  Patient presents with   Hospitalization Follow-up    TOC - hsp follow up ( suggested by drs in hsp to see pulm for excessive daytime sleepiness)   Dyspnea:  nl activities daily living/ landing x 3 steps ok no full flight  Cough: minimal in am  Sleep: sleeping in recliner due to dyspnea around 30 degrees  SABA use: none  02 ldz:wnwz  Extremely hypersomnolent if not stimulated but no problem driving     No obvious day to day or daytime pattern/variability or assoc excess/ purulent sputum or mucus plugs or hemoptysis or cp or chest tightness, subjective wheeze or overt sinus or hb symptoms.    Also denies any obvious fluctuation of symptoms with weather or environmental changes or other aggravating or alleviating factors except as outlined above   No unusual exposure hx or h/o childhood pna/ asthma or knowledge of premature birth.  Current Allergies, Complete Past Medical History, Past Surgical History, Family History, and Social  History were reviewed in [employer] record.  ROS  The following are not active complaints unless bolded Hoarseness, sore throat, dysphagia, dental problems, itching, sneezing,  nasal congestion or discharge of excess mucus or purulent secretions, ear ache,   fever, chills, sweats, unintended wt loss or wt gain, classically pleuritic or exertional cp,  orthopnea pnd or arm/hand swelling  or leg swelling, presyncope, palpitations, abdominal pain, anorexia, nausea, vomiting, diarrhea  or change in bowel habits or change in bladder habits, change in stools or change in urine, dysuria, hematuria,  rash, arthralgias, visual complaints, headache, numbness, weakness or ataxia or problems with walking or coordination,  change in mood or  memory.            Outpatient Medications Prior to Visit  Medication Sig Dispense Refill   Accu-Chek Softclix Lancets lancets Use as instructed to test blood glucose four times daily 100 each 2   aspirin  81 MG chewable tablet Chew 1 tablet (81 mg total) by mouth daily. 30 tablet 0   busPIRone  (BUSPAR ) 5 MG tablet Take 5 mg by mouth at bedtime.  Cholecalciferol  (VITAMIN D3) 125 MCG (5000 UT) CAPS Take 1 capsule (5,000 Units total) by mouth daily. 90 capsule 0   citalopram  (CELEXA ) 20 MG tablet Take 1 tablet (20 mg total) by mouth every evening. 30 tablet 4   clopidogrel  (PLAVIX ) 75 MG tablet Take 1 tablet (75 mg total) by mouth daily with breakfast. 30 tablet 0   Continuous Glucose Receiver (FREESTYLE LIBRE 2 READER) DEVI USE TO CHECK GLUCOSE AS DIRECTED 1 each 0   Continuous Glucose Sensor (FREESTYLE LIBRE 2 SENSOR) MISC CHANGE SENSOR EVERY 14 DAYS USE  TO CHECK BLOOD GLUCOSE  CONTINUOUSLY 6 each 3   donepezil  (ARICEPT ) 5 MG tablet Take 5 mg by mouth every evening.     empagliflozin  (JARDIANCE ) 25 MG TABS tablet Take 1 tablet (25 mg total) by mouth daily. 90 tablet 1   estradiol  (ESTRACE ) 1 MG tablet Take 1 mg by mouth at bedtime.      fenofibrate  160 MG tablet Take 1 tablet (160 mg total) by mouth daily. 90 tablet 1   gabapentin  (NEURONTIN ) 100 MG capsule Take 1 capsule (100 mg total) by mouth at bedtime.     glucose blood test strip 1 each by Other route as needed. Use as instructed to test blood glucose four times daily 100 each 2   HUMALOG  KWIKPEN 100 UNIT/ML KwikPen INJECT SUBCUTANEOUSLY 16 TO 22  UNITS INTO THE SKIN 3 TIMES  DAILY BEFORE MEALS 60 mL 2   insulin  degludec (TRESIBA  FLEXTOUCH) 200 UNIT/ML FlexTouch Pen Inject 110 Units into the skin at bedtime. 24 mL 2   Insulin  Pen Needle (B-D ULTRAFINE III SHORT PEN) 31G X 8 MM MISC 1 each by Does not apply route as directed. 100 each 2   isosorbide  mononitrate (IMDUR ) 30 MG 24 hr tablet Take 3 tablets (90 mg total) by mouth daily. 90 tablet 0   levocetirizine (XYZAL ) 5 MG tablet Take 5 mg by mouth every evening.     levothyroxine  (SYNTHROID ) 50 MCG tablet TAKE 1 TABLET BY MOUTH DAILY  BEFORE BREAKFAST 90 tablet 3   Magnesium  Oxide (MAGNESIUM  OXIDE 400) 240 MG PACK Take 400 mg by mouth daily.     metFORMIN  (GLUCOPHAGE ) 500 MG tablet TAKE 1 TABLET BY MOUTH DAILY  WITH BREAKFAST 90 tablet 3   montelukast  (SINGULAIR ) 10 MG tablet Take 10 mg by mouth at bedtime.     OVER THE COUNTER MEDICATION Take 2 capsules by mouth at bedtime. Omega XL     pantoprazole  (PROTONIX ) 40 MG tablet TAKE 2 TABLETS EVERY DAY 180 tablet 1   rOPINIRole  (REQUIP ) 2 MG tablet Take 2 mg by mouth at bedtime.     rosuvastatin  (CRESTOR ) 40 MG tablet Take 1 tablet (40 mg total) by mouth daily. 30 tablet 5   Semaglutide ,0.25 or 0.5MG /DOS, (OZEMPIC , 0.25 OR 0.5 MG/DOSE,) 2 MG/1.5ML SOPN Inject 0.5 mg into the skin every Thursday. (Patient taking differently: Inject 0.5 mg into the skin every Thursday.)     Vibegron  (GEMTESA ) 75 MG TABS Take 1 tablet (75 mg total) by mouth daily. 30 tablet 11   warfarin (COUMADIN ) 5 MG tablet TAKE ONE-HALF TABLET TO 1 TABLET BY MOUTH  DAILY OR AS DIRECTED 100 tablet 1   modafinil   (PROVIGIL ) 100 MG tablet Take 1 tablet (100 mg total) by mouth daily. (Patient not taking: Reported on 08/02/2024) 90 tablet 0   No facility-administered medications prior to visit.    Past Medical History:  Diagnosis Date   ADD (attention deficit disorder)  Allergy ?   Ive had seasonal allergies for years.   Anxiety 2006   Thats when I was first put on Celexa .   Arthritis Not sure   CAD (coronary artery disease)    Diabetes mellitus without complication (HCC)    I dont remember.   Emphysema of lung (HCC) 2022   Mild case   GERD (gastroesophageal reflux disease) Not sure   Heart murmur Not sure   Hypertension    PAF (paroxysmal atrial fibrillation) (HCC)    Sleep apnea 2022   Thyroid  disease 2000      Objective:     BP 116/68   Pulse 92   Ht 5' 4 (1.626 m)   Wt 213 lb (96.6 kg)   SpO2 97% Comment: ra  BMI 36.56 kg/m   SpO2: 97 % (ra)  amb somber mod obese (by BMI) wf nad    HEENT : Oropharynx  clear      Nasal turbinates nl    NECK :  without  apparent JVD/ palpable Nodes/TM    LUNGS: no acc muscle use,  Nl contour chest which is clear to A and P bilaterally without cough on insp or exp maneuvers   CV:  RRR  no s3 or murmur or increase in P2, and no edema   ABD:  soft and nontender   MS:  Gait nl   ext warm without deformities Or obvious joint restrictions  calf tenderness, cyanosis or clubbing    SKIN: warm and dry without lesions    NEURO:  alert, approp, nl sensorium with  no motor or cerebellar deficits apparent.    I personally reviewed images and agree with radiology impression as follows:  CXR:   portable 07/25/24 No active dz    Assessment   Assessment & Plan OSA on CPAP Has been inconsistent with use and even when uses cpap her AHI is 15.7 by her last download and has hypersomnolence which may be in part due to narcolepsy s a pulmoanry problem identified in clinic today so rec  >>> continue cpap setting for now:  autoset 8 -14 and  no 02   >>> refill modafinil  until gets in with sleep medicine specialist   >>> f/u in pulmonary clinic prn   Discussed in detail all the  indications, usual  risks and alternatives  relative to the benefits with patient who agrees to proceed with Rx as outlined.       Total time for H and P, chart review, counseling, reviewing cpap download and equipment and generating customized AVS unique to this office visit / same day charting = 43 min in complex pt new to me           AVS  Patient Instructions  Will need referral to sleep medicine for narcolepsy and sleep apnea in [place].  Pulmonary clinic follow up is as needed    Ozell America, MD 08/03/2024

## 2024-08-03 ENCOUNTER — Encounter (HOSPITAL_COMMUNITY)

## 2024-08-03 ENCOUNTER — Ambulatory Visit: Attending: Internal Medicine

## 2024-08-03 DIAGNOSIS — I4821 Permanent atrial fibrillation: Secondary | ICD-10-CM | POA: Diagnosis not present

## 2024-08-03 DIAGNOSIS — Z5181 Encounter for therapeutic drug level monitoring: Secondary | ICD-10-CM

## 2024-08-03 LAB — POCT INR: INR: 1.6 — AB (ref 2.0–3.0)

## 2024-08-03 MED FILL — Verapamil HCl IV Soln 2.5 MG/ML: INTRAVENOUS | Qty: 2 | Status: AC

## 2024-08-03 NOTE — Patient Instructions (Signed)
 Take warfarin 1 tablet tonight then change warfarin to 1/2 tablet daily except 1 tablet on Fridays Recheck INR in 1 week

## 2024-08-03 NOTE — Progress Notes (Signed)
 INR 1.6; Please see anticoagulation encounter

## 2024-08-03 NOTE — Assessment & Plan Note (Addendum)
 Has been inconsistent with use and even when uses cpap her AHI is 15.7 by her last download and has hypersomnolence which may be in part due to narcolepsy s a pulmoanry problem identified in clinic today so rec  >>> continue cpap setting for now:  autoset 8 -14 and no 02   >>> refill modafinil  until gets in with sleep medicine specialist   >>> f/u in pulmonary clinic prn   Discussed in detail all the  indications, usual  risks and alternatives  relative to the benefits with patient who agrees to proceed with Rx as outlined.       Total time for H and P, chart review, counseling, reviewing cpap download and equipment and generating customized AVS unique to this office visit / same day charting = 43 min in complex pt new to me

## 2024-08-07 ENCOUNTER — Telehealth: Payer: Self-pay

## 2024-08-07 ENCOUNTER — Other Ambulatory Visit (HOSPITAL_COMMUNITY): Payer: Self-pay

## 2024-08-07 ENCOUNTER — Ambulatory Visit: Attending: Cardiology | Admitting: *Deleted

## 2024-08-07 DIAGNOSIS — I4821 Permanent atrial fibrillation: Secondary | ICD-10-CM | POA: Diagnosis not present

## 2024-08-07 DIAGNOSIS — Z5181 Encounter for therapeutic drug level monitoring: Secondary | ICD-10-CM

## 2024-08-07 LAB — POCT INR: INR: 2.1 (ref 2.0–3.0)

## 2024-08-07 NOTE — Telephone Encounter (Signed)
 Your request has been approved Request Reference Number: [id number]. MODAFINIL  TAB 100MG  is approved through 02/05/2025. Your patient may now fill this prescription and it will be covered. Authorization Expiration06/22/2026

## 2024-08-07 NOTE — Progress Notes (Signed)
 INR 2.1; Please see anticoagulation encounter

## 2024-08-07 NOTE — Patient Instructions (Signed)
 Continue warfarin 1/2 tablet daily except 1 tablet on Fridays Recheck INR in 2 weeks

## 2024-08-07 NOTE — Telephone Encounter (Signed)
*  Pulm  Pharmacy Patient Advocate Encounter   Received notification from Fax that prior authorization for Modafinil  100mg   is required/requested.   Insurance verification completed.   The patient is insured through [facility].   Per test claim: PA required; PA submitted to above mentioned insurance via Latent Key/confirmation #/EOC [id number] Status is pending

## 2024-08-08 ENCOUNTER — Encounter (HOSPITAL_COMMUNITY)

## 2024-08-11 ENCOUNTER — Telehealth: Payer: Self-pay | Admitting: Nurse Practitioner

## 2024-08-11 ENCOUNTER — Ambulatory Visit: Attending: Nurse Practitioner | Admitting: Nurse Practitioner

## 2024-08-11 ENCOUNTER — Encounter: Payer: Self-pay | Admitting: Nurse Practitioner

## 2024-08-11 VITALS — BP 116/60 | HR 60 | Ht 64.0 in | Wt 220.0 lb

## 2024-08-11 DIAGNOSIS — I2510 Atherosclerotic heart disease of native coronary artery without angina pectoris: Secondary | ICD-10-CM

## 2024-08-11 DIAGNOSIS — I4821 Permanent atrial fibrillation: Secondary | ICD-10-CM | POA: Diagnosis not present

## 2024-08-11 DIAGNOSIS — I10 Essential (primary) hypertension: Secondary | ICD-10-CM

## 2024-08-11 DIAGNOSIS — E781 Pure hyperglyceridemia: Secondary | ICD-10-CM

## 2024-08-11 DIAGNOSIS — Z01810 Encounter for preprocedural cardiovascular examination: Secondary | ICD-10-CM | POA: Diagnosis not present

## 2024-08-11 DIAGNOSIS — E785 Hyperlipidemia, unspecified: Secondary | ICD-10-CM | POA: Diagnosis not present

## 2024-08-11 DIAGNOSIS — R5383 Other fatigue: Secondary | ICD-10-CM | POA: Diagnosis not present

## 2024-08-11 DIAGNOSIS — E669 Obesity, unspecified: Secondary | ICD-10-CM

## 2024-08-11 MED ORDER — BLOOD PRESSURE KIT: 1.0000 | PACK | Freq: Once | 0 refills | Status: DC

## 2024-08-11 MED ORDER — BLOOD PRESSURE KIT: 1.0000 | PACK | Freq: Once | 0 refills | Status: AC

## 2024-08-11 NOTE — Telephone Encounter (Signed)
 Pt calling because [facility] does not do blood pressure cuff Rxs. She is asking it be sent to [facility] Delivery. Please advise.

## 2024-08-11 NOTE — Progress Notes (Addendum)
 " Cardiology Office Note:  .   Date: 08/11/2024 ID:  OTHA Odonnell, DOB 08-Feb-1956, MRN [id number] PCP: Rachel Odonnell  [facility] Providers Cardiologist:  Jayson Sierras, MD Cardiology APP:  Miriam Norris, NP    History of Present Illness: Rachel Odonnell is a 68 y.o. female with a PMH of CAD, HLD, HTN, PAF, mild aortic valve stenosis, and OSA (compliant with CPAP), who presents today for scheduled follow-up.   Prior history of DES x 1 to prox to mid LAD, abrupt occlusion of diagonal that was jailed by stent. Successfully treated with POBA in August 2024.   Seen by Rachel Alberts, NP on 04/23/2023 after cardiac catheterization. Was overall feeling better but felt few episodes of fleeting chest pain. Was staying active. Was cleared to start cardiac rehab.   05/25/2023 - Today she presents for 1 month follow-up. She says her chest pain seems to be improved slightly, but not significantly. Describes chest pain as dull ache along left lateral side of chest wall, nonradiating. Denies any alleviating or aggravating factors, however says she takes NTG when this occurs, lays down, and pain is resolved. 5/10 on 0-10 pain scale. Says she is tired, not very active. Says she is not taking provigil . Says if she takes this medicine, she will not be able to get sleep and will up awake all night. Denies any shortness of breath, palpitations, syncope, presyncope, dizziness, orthopnea, PND, swelling or significant weight changes, acute bleeding, or claudication.  07/06/2023 - Presents for follow-up today with her husband.  She says that her and her husband have returned from a cruise not that long ago.  Has been recently getting over some sinusitis and bronchitis. She continues to notice CP, not as often compared to last office visit, says adjustment with Imdur  has helped her symptoms. Continues to note fatigue. Denies any shortness of breath, palpitations, syncope, presyncope, dizziness,  orthopnea, PND, swelling or significant weight changes, acute bleeding, or claudication.  She is requesting to stop cardiac rehab at this point and just exercise at [employer].  She also sees a nutritionist regularly.  09/06/2023 - Presents today for follow-up. Still having extreme fatigue. Denies any other changes to her health.  Denies any chest pain, shortness of breath, palpitations, syncope, presyncope, dizziness, orthopnea, PND, swelling or significant weight changes, acute bleeding, or claudication.  She was hospitalized in September 2025 she noted worsening chest pain at that time and was admitted for further evaluation, was evaluated for CABG.  Was taken to the OR on May 06, 2024 and underwent CABG x 1, tolerated procedure well.  Did have some A-fib but was not felt to be a candidate for amiodarone therapy due to bradycardia.  Recently in the hospital earlier this month for unstable angina.  She underwent left heart cath that showed severe restenosis of proximal LAD stented segment with patent LIMA to LAD and stable moderate obtuse marginal branch stenosis.  Received successful PTCA/DES x 1 to proximal to mid RCA.  Recommended to continue DAPT with aspirin  and Plavix  for at least 6 months.  Echo revealed LVEF normal, grade 1 DD, severe thickening of the aortic valve with no aortic valve stenosis.  She is here for scheduled follow-up.  She states she is doing well.  Does have some generalized soreness since her open heart surgery along her chest, she is pending dental cleaning in March 2026.  Overall doing well since her procedure, does have some slight bruising along  the left breast from previous heart cath.  Overall doing very well.  She is requesting to return to taking Provigil  as this helped her narcolepsy, difficult for her to get tasks done during the day. Denies any shortness of breath, palpitations, syncope, presyncope, dizziness, orthopnea, PND, swelling or significant weight  changes, acute bleeding, or claudication.  ROS: Negative. See HPI.   Studies Reviewed: SABRA    LHC 07/2024:    1st Diag lesion is 60% stenosed.   1st Mrg lesion is 60% stenosed.   Prox LAD lesion is 95% stenosed.   Prox RCA lesion is 99% stenosed.   A drug-eluting stent was successfully placed using a STENT ONYX FRONTIER [id number].   Post intervention, there is a 0% residual stenosis.   LIMA graft was visualized by angiography and is normal in caliber.   Severe restenosis proximal LAD stented segment. Patent LIMA to LAD Stable moderate obtuse marginal branch stenosis. Unchanged from last two caths.  Severe stenosis in the proximal RCA Successful PTCA/DES x 1 proximal to mid RCA Normal LV systolic function   Recommendations: Continue DAPT with ASA and Plavix  for at least six months. Discharge home tomorrow.   Echo 07/2024:  1. Very distal apical hypokinesis noted with use of echocontrast. Left  ventricular ejection fraction, by estimation, is 60 to 65%. The left  ventricle has normal function. The left ventricle has no regional wall  motion abnormalities. There is mild left  ventricular hypertrophy. Left ventricular diastolic parameters are  consistent with Grade I diastolic dysfunction (impaired relaxation).   2. Right ventricular systolic function is normal. The right ventricular  size is normal.   3. The mitral valve is normal in structure. No evidence of mitral valve  regurgitation. No evidence of mitral stenosis.   4. The aortic valve is tricuspid. There is severe calcifcation of the  aortic valve. There is severe thickening of the aortic valve. Aortic valve  regurgitation is not visualized. No aortic stenosis is present.   5. The inferior vena cava is normal in size with greater than 50%  respiratory variability, suggesting right atrial pressure of 3 mmHg.  Vascular ultrasound 04/2024:  Summary:  Right Carotid: Velocities in the right ICA are consistent with a 1-39%   stenosis.   Left Carotid: Velocities in the left ICA are consistent with a 1-39%  stenosis.  Vertebrals: Bilateral vertebral arteries demonstrate antegrade flow.  Subclavians: Normal flow hemodynamics were seen in bilateral subclavian               arteries.   Right ABI: Resting right ankle-brachial index is within normal range. The  right toe-brachial index is abnormal.  Left ABI: Resting left ankle-brachial index is within normal range. The  left toe-brachial index is abnormal.  Right Upper Extremity: Doppler waveforms remain within normal limits with  right radial compression. Doppler waveforms decrease <50% with right ulnar  compression.  Left Upper Extremity: Doppler waveforms decrease 50% with left radial  compression. Doppler waveforms decrease <50% with left ulnar compression.    LHC 04/2024:   Prox RCA to Mid RCA lesion is 35% stenosed.   1st Mrg lesion is 60% stenosed.   Prox LAD lesion is 95% stenosed.   1st Diag lesion is 60% stenosed.   1.  Severe one-vessel coronary artery disease due to in-stent restenosis in the proximal portion of the LAD stent that was placed last year.  Stable left circumflex disease. 2.  Left ventricular angiography was not performed.  EF was  normal by echo. 3.  Mild gradient across the aortic valve. 4.  Severe right radial artery spasm.   Recommendations: The patient has very aggressive restenosis in the proximal LAD.  Management options include CABG versus drug-coated balloon angioplasty.  Will discuss during the heart meeting tomorrow.  If PCI is pursued, it will have to be done via the femoral approach. Resume heparin  drip 2 hours after TR band removal. I held clopidogrel  for now in case CABG is needed.  Right/left heart cath 03/2023:    Prox RCA to Mid RCA lesion is 35% stenosed.   1st Mrg lesion is 60% stenosed.   Prox LAD to Mid LAD lesion is 60% stenosed.   A drug-eluting stent was successfully placed using a SYNERGY XD 3.0X38.    Post intervention, there is a 0% residual stenosis.   1st Diag lesion is 40% stenosed.   Balloon angioplasty was performed using a BALLN SAPPHIRE 2.0X12.   Post intervention, there is a 0% residual stenosis.   The left ventricular systolic function is normal.   LV end diastolic pressure is mildly elevated.   The left ventricular ejection fraction is 55-65% by visual estimate.   Hemodynamic findings consistent with mild pulmonary hypertension.   Recommend to resume Warfarin, at currently prescribed dose and frequency on 04/17/2023.   Recommend concurrent antiplatelet therapy of Aspirin  81 mg for 1 month and Clopidogrel  75mg  daily for 6 months .   Single vessel obstructive CAD. Long segment of disease in the proximal to mid LAD. Angiographically this did not appear to be severe but flow was impaired on both CT FFR and directly measured RFR. Severe plaque on IVUS with Minimal lumen diameter of 2.5 mm squared.  Normal LV function. Mildly elevated LV filling pressures. PCWP 21/23 mean 16 mm Hg. LVEDP 21 mm Hg Mild pulmonary HTN PAP 40/14 mean 27 mm Hg Cardiac output 4.3 L/min with index 2.08. Successful PCI of the proximal to mid LAD with IVUS guidance and DES x 1. Abrupt occlusion of diagonal that was jailed by stent. Successfully treated with POBA through the stent struts.    Plan: will monitor overnight on telemetry. DAPT with ASA for one month and Plavix  for 6 months. May resume Coumadin  tomorrow if no bleeding problems.   Echo 02/2023:   1. Left ventricular ejection fraction, by estimation, is 60 to 65%. Left  ventricular ejection fraction by 3D volume is 63 %. The left ventricle has  normal function. The left ventricle has no regional wall motion  abnormalities. Left ventricular diastolic   parameters are consistent with Grade I diastolic dysfunction (impaired  relaxation). The average left ventricular global longitudinal strain is  -20.2 %. The global longitudinal strain is normal.   2.  Right ventricular systolic function is normal. The right ventricular  size is normal.   3. The mitral valve is grossly normal. Trivial mitral valve  regurgitation. No evidence of mitral stenosis.   4. The aortic valve is calcified. There is mild calcification of the  aortic valve. There is mild thickening of the aortic valve. Aortic valve  regurgitation is not visualized. Mild aortic valve stenosis. Aortic valve  area, by VTI measures 1.55 cm.  Aortic valve mean gradient measures 12.0 mmHg. Aortic valve Vmax measures  2.32 m/s.  CCTA 02/2023: IMPRESSION: 1. Coronary calcium  score of 153. This was 83rd percentile for age-, sex, and race-matched controls.   2. Total plaque volume 313 mm3 which is 64th percentile for age- and sex-matched controls (calcified  plaque 59 mm3; non-calcified plaque 254 mm3). TPV is severe.   3. Normal coronary origin with right dominance.   4. There is moderate (50-69%) calcified plaque in the LAD and LCX. CAD-RADS 3.   5. Will send study for FFRct.  5. Aortic atherosclerosis.   IMPRESSION: 1. FFRct findings are consistent with significant stenosis in the mid LAD.   2.  Recommend cardiac catheterization.  Cardiac monitor 04/2021:  12 day monitor Rare supraventricular ectopy in the form of isolated PACs, couplets, triplets. Fourteen episodes of SVT longest 8 beats Rare ventricular ectopy in the form of isolated PVCs, couplets. Four episodes of NSVT longest 15 beats. Reported symptoms correlated with sinus rhythm, PACs, PVCs, and afib Episodes of afib <1% burden, rates were controlled Single nocturnal pause 3.1 seconds     Patch Wear Time:  12 days and 7 hours (2022-08-22T11:20:36-0400 to 2022-09-03T18:39:38-0400)   Patient had a min HR of 45 bpm, max HR of 128 bpm, and avg HR of 58 bpm. Predominant underlying rhythm was Sinus Rhythm. First Degree AV Block was present. 4 Ventricular Tachycardia runs occurred, the run with the fastest interval  lasting 15 beats with a  max rate of 128 bpm (avg 119 bpm); the run with the fastest interval was also the longest. 14 Supraventricular Tachycardia runs occurred, the run with the fastest interval lasting 8 beats with a max rate of 121 bpm (avg 99 bpm); the run with the fastest  interval was also the longest. Atrial Fibrillation occurred (<1% burden), ranging from 64-116 bpm (avg of 82 bpm), the longest lasting 4 mins 51 secs with an avg rate of 79 bpm. 1 Pause occurred lasting 3.1 secs (19 bpm). Atrial Fibrillation was  detected within +/- 45 seconds of symptomatic patient event(s). Isolated SVEs were rare (<1.0%), SVE Couplets were rare (<1.0%), and SVE Triplets were rare (<1.0%). Isolated VEs were rare (<1.0%), VE Couplets were rare (<1.0%), and no VE Triplets were  present. Risk Assessment/Calculations:    CHA2DS2-VASc Score = 5  This indicates a 7.2% annual risk of stroke. The patient's score is based upon: CHF History: 0 HTN History: 1 Diabetes History: 1 Stroke History: 0 Vascular Disease History: 1 Age Score: 1 Gender Score: 1      Physical Exam:   VS:  BP 116/60   Pulse 60   Ht 5' 4 (1.626 m)   Wt 220 lb (99.8 kg)   SpO2 99%   BMI 37.76 kg/m    Wt Readings from Last 3 Encounters:  08/14/24 218 lb 6.4 oz (99.1 kg)  08/11/24 220 lb (99.8 kg)  08/02/24 213 lb (96.6 kg)    GEN: Obese, 68 year old female in no acute distress NECK: No JVD; No carotid bruits CARDIAC: S1/S2, irregularly irregular rhythm, Grade 1 murmur, no rubs or gallops, sternal incision is well-approximated and healing well RESPIRATORY:  Clear to auscultation without rales, wheezing or rhonchi  EXTREMITIES:  No edema; No deformity   ASSESSMENT AND PLAN: .    CAD, s/p CABG in 04/2024 Denies any chest pain, see cardiac catheterization reports noted above.  Continue current medication regimen. Heart healthy diet and regular cardiovascular exercise encouraged. Care and ED precautions discussed.   HLD,  hypertriglyceridemia LDL 70 07/2024. She is not at goal. I offered/ recommended at medication adjustment, pt defers to her Endocrinologist. LDL goal < 55. Continue current medication regimen. Heart healthy diet and regular cardiovascular exercise encouraged.    3. HTN BP stable. Discussed to monitor BP at home at  least 2 hours after medications and sitting for 5-10 minutes.  No medication changes at this time. Heart healthy diet and regular cardiovascular exercise encouraged.   4. A-fib Denies any tachycardia or palpitations. HR well controlled today. Followed closely at Coumadin  Clinic. Continue Coumadin , denies any bleeding issues.   5. Fatigue Etiology multifactorial.  Does have hx of narcolepsy.  Wants to return to taking Provigil .  Ran past clinical pharmacist today.  Discussed in office and also left a MyChart message regarding caution due to her cardiac history.  Recommended she speak with prescribing provider.  Compliant with her CPAP. She verbalized understanding.    6.  Obesity Weight loss via diet and exercise encouraged. Discussed the impact being overweight would have on cardiovascular risk.  7. Pre-op clearance Ms. Golding's perioperative risk of a major cardiac event is 6.6% according to the Revised Cardiac Risk Index (RCRI).  Therefore, she is at high risk for perioperative complications.   Her functional capacity is good at > 4 METs according to the Duke Activity Status Index (DASI). Recommendations: According to ACC/AHA guidelines, no further cardiovascular testing needed.  The patient may proceed to surgery at acceptable risk.   Antiplatelet and/or Anticoagulation Recommendations: The patient should remain on Aspirin  without interruption.   For routine dental procedures, including standard dental cleanings, x-rays, simple fillings, crowns and local anesthetic injections, Plavix  is typically not stopped.  The bleeding risk is low and can almost always be managed with local  measures.  Nor does Coumadin  need to be held for a dental cleaning per current guidelines.   Dispo: Follow-up with Dr. Debera or APP in 6-8 weeks or sooner if anything changes.   Signed, [name], NP   "

## 2024-08-11 NOTE — Patient Instructions (Addendum)
 Medication Instructions:  Your physician recommends that you continue on your current medications as directed. Please refer to the Current Medication list given to you today.  *If you need a refill on your cardiac medications before your next appointment, please call your pharmacy*  Lab Work: None If you have labs (blood work) drawn today and your tests are completely normal, you will receive your results only by: MyChart Message (if you have MyChart) OR A paper copy in the mail If you have any lab test that is abnormal or we need to change your treatment, we will call you to review the results.  Testing/Procedures: None  Follow-Up: At [facility], you and your health needs are our priority.  As part of our continuing mission to provide you with exceptional heart care, our providers are all part of one team.  This team includes your primary Cardiologist (physician) and Advanced Practice Providers or APPs (Physician Assistants and Nurse Practitioners) who all work together to provide you with the care you need, when you need it.  Your next appointment:   6-8 week(s)  Provider:   [name], NP      We recommend signing up for the patient portal called MyChart.  Sign up information is provided on this After Visit Summary.  MyChart is used to connect with patients for Virtual Visits (Telemedicine).  Patients are able to view lab/test results, encounter notes, upcoming appointments, etc.  Non-urgent messages can be sent to your provider as well.   To learn more about what you can do with MyChart, go to [email or web address].   Other Instructions Thank you for choosing [facility]!

## 2024-08-11 NOTE — Telephone Encounter (Signed)
 Blood Pressure kit sent to [facility]

## 2024-08-13 ENCOUNTER — Other Ambulatory Visit: Payer: Self-pay | Admitting: "Endocrinology

## 2024-08-13 DIAGNOSIS — E039 Hypothyroidism, unspecified: Secondary | ICD-10-CM

## 2024-08-14 ENCOUNTER — Ambulatory Visit: Admitting: "Endocrinology

## 2024-08-14 ENCOUNTER — Telehealth (HOSPITAL_COMMUNITY): Payer: Self-pay

## 2024-08-14 ENCOUNTER — Encounter (HOSPITAL_COMMUNITY): Payer: Self-pay

## 2024-08-14 ENCOUNTER — Encounter: Payer: Self-pay | Admitting: "Endocrinology

## 2024-08-14 VITALS — BP 136/76 | HR 68 | Resp 18 | Ht 64.0 in | Wt 218.4 lb

## 2024-08-14 DIAGNOSIS — E039 Hypothyroidism, unspecified: Secondary | ICD-10-CM | POA: Diagnosis not present

## 2024-08-14 DIAGNOSIS — E1159 Type 2 diabetes mellitus with other circulatory complications: Secondary | ICD-10-CM | POA: Diagnosis not present

## 2024-08-14 DIAGNOSIS — I10 Essential (primary) hypertension: Secondary | ICD-10-CM

## 2024-08-14 DIAGNOSIS — Z794 Long term (current) use of insulin: Secondary | ICD-10-CM

## 2024-08-14 DIAGNOSIS — E782 Mixed hyperlipidemia: Secondary | ICD-10-CM

## 2024-08-14 LAB — POCT GLYCOSYLATED HEMOGLOBIN (HGB A1C): Hemoglobin A1C: 8.2 % — AB (ref 4.0–5.6)

## 2024-08-14 MED ORDER — TRESIBA FLEXTOUCH 200 UNIT/ML ~~LOC~~ SOPN: 100.0000 [IU] | PEN_INJECTOR | Freq: Every day | SUBCUTANEOUS | 2 refills | Status: AC

## 2024-08-14 MED ORDER — OZEMPIC (0.25 OR 0.5 MG/DOSE) 2 MG/1.5ML ~~LOC~~ SOPN: 0.5000 mg | PEN_INJECTOR | SUBCUTANEOUS | 0 refills | Status: AC

## 2024-08-14 MED ORDER — INSULIN LISPRO (1 UNIT DIAL) 100 UNIT/ML (KWIKPEN): 10.0000 [IU] | PEN_INJECTOR | Freq: Three times a day (TID) | SUBCUTANEOUS | 1 refills | Status: AC

## 2024-08-14 NOTE — Patient Instructions (Signed)

## 2024-08-14 NOTE — Progress Notes (Signed)
 "                                                                                 08/14/2024, 10:21 AM   Endocrinology follow-up note  Subjective:    Patient ID: Rachel Odonnell, female    DOB: 1955-10-27.  Rachel Odonnell is being seen in follow-up after she was seen in consultation for  management of currently uncontrolled, symptomatic type 2 diabetes requested by  Skillman, Katherine E, PA-C.   Past Medical History:  Diagnosis Date   ADD (attention deficit disorder)    Allergy ?   Ive had seasonal allergies for years.   Anxiety 2006   Thats when I was first put on Celexa .   Arthritis Not sure   CAD (coronary artery disease)    Diabetes mellitus without complication (HCC)    I dont remember.   Emphysema of lung (HCC) 2022   Mild case   GERD (gastroesophageal reflux disease) Not sure   Heart murmur Not sure   Hypertension    PAF (paroxysmal atrial fibrillation) (HCC)    Sleep apnea 2022   Thyroid  disease 2000    Past Surgical History:  Procedure Laterality Date   ABDOMINAL HYSTERECTOMY  1984   APPENDECTOMY  Not sure   Appendix was taken out during my ovary removal.   COLONOSCOPY  02/2022   CORONARY ARTERY BYPASS GRAFT N/A 05/06/2024   Procedure: OFF PUMP CORONARY ARTERY BYPASS GRAFTING (CABG) TIMES ONE USING LEFT INTERNAL MAMMARY ARTERY;  Surgeon: Shyrl Linnie KIDD, MD;  Location: [facility] OR;  Service: Open Heart Surgery;  Laterality: N/A;   CORONARY PRESSURE/FFR STUDY N/A 04/16/2023   Procedure: CORONARY PRESSURE/FFR STUDY;  Surgeon: Jordan, Peter M, MD;  Location: [facility] INVASIVE CV LAB;  Service: Cardiovascular;  Laterality: N/A;   CORONARY STENT INTERVENTION N/A 04/16/2023   Procedure: CORONARY STENT INTERVENTION;  Surgeon: Jordan, Peter M, MD;  Location: [facility] INVASIVE CV LAB;  Service: Cardiovascular;  Laterality: N/A;   CORONARY STENT INTERVENTION N/A 07/28/2024   Procedure: CORONARY STENT INTERVENTION;  Surgeon: Verlin Lonni BIRCH, MD;  Location: [facility] INVASIVE CV LAB;   Service: Cardiovascular;  Laterality: N/A;   CORONARY ULTRASOUND/IVUS N/A 04/16/2023   Procedure: Coronary Ultrasound/IVUS;  Surgeon: Jordan, Peter M, MD;  Location: [facility] INVASIVE CV LAB;  Service: Cardiovascular;  Laterality: N/A;   EXCISIONAL HEMORRHOIDECTOMY     INCONTINENCE SURGERY     INTRAOPERATIVE TRANSESOPHAGEAL ECHOCARDIOGRAM N/A 05/06/2024   Procedure: ECHOCARDIOGRAM, TRANSESOPHAGEAL, INTRAOPERATIVE;  Surgeon: Shyrl Linnie KIDD, MD;  Location: [facility] OR;  Service: Open Heart Surgery;  Laterality: N/A;   LEFT HEART CATH AND CORONARY ANGIOGRAPHY N/A 05/04/2024   Procedure: LEFT HEART CATH AND CORONARY ANGIOGRAPHY;  Surgeon: Darron Deatrice LABOR, MD;  Location: [facility] INVASIVE CV LAB;  Service: Cardiovascular;  Laterality: N/A;   LEFT HEART CATH AND CORS/GRAFTS ANGIOGRAPHY N/A 07/28/2024   Procedure: LEFT HEART CATH AND CORS/GRAFTS ANGIOGRAPHY;  Surgeon: Verlin Lonni BIRCH, MD;  Location: [facility] INVASIVE CV LAB;  Service: Cardiovascular;  Laterality: N/A;   RIGHT/LEFT HEART CATH AND CORONARY ANGIOGRAPHY N/A 06/13/2021   Procedure: RIGHT/LEFT HEART CATH AND CORONARY ANGIOGRAPHY;  Surgeon: Jordan, Peter M, MD;  Location: [facility] INVASIVE CV  LAB;  Service: Cardiovascular;  Laterality: N/A;   RIGHT/LEFT HEART CATH AND CORONARY ANGIOGRAPHY N/A 04/16/2023   Procedure: RIGHT/LEFT HEART CATH AND CORONARY ANGIOGRAPHY;  Surgeon: Jordan, Peter M, MD;  Location: [facility] INVASIVE CV LAB;  Service: Cardiovascular;  Laterality: N/A;   TUBAL LIGATION  1982   After my last child    Social History   Socioeconomic History   Marital status: Married    Spouse name: Meribeth   Number of children: Not on file   Years of education: Not on file   Highest education level: Not on file  Occupational History   Not on file  Tobacco Use   Smoking status: Former    Current packs/day: 0.00    Average packs/day: 2.0 packs/day    Types: Cigarettes    Quit date: 08/17/1994    Years since quitting: 30.0   Smokeless tobacco: Never   Vaping Use   Vaping status: Never Used  Substance and Sexual Activity   Alcohol  use: No   Drug use: No   Sexual activity: Yes  Other Topics Concern   Not on file  Social History Narrative   Lives at home with husband, Meribeth   Former smoker Smoked 2 ppd x 25 yrs; Quit in 1996   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, retail  retired secondary school teacher, still works part-time at the [employer], used to work editor, commissioning   Now on fixed income from her SSI and pension   Social Drivers of Health   Tobacco Use: Medium Risk (08/14/2024)   Patient History    Smoking Tobacco Use: Former    Smokeless Tobacco Use: Never    Passive Exposure: Not on Actuary Strain: Low Risk (10/10/2021)   Overall Financial Resource Strain (CARDIA)    Difficulty of Paying Living Expenses: Not hard at all  Food Insecurity: No Food Insecurity (07/31/2024)   Epic    Worried About Programme Researcher, Broadcasting/film/video in the Last Year: Never true    Ran Out of Food in the Last Year: Never true  Transportation Needs: No Transportation Needs (07/31/2024)   Epic    Lack of Transportation (Medical): No    Lack of Transportation (Non-Medical): No  Physical Activity: Inactive (04/08/2023)   Received from [facility]   Exercise Vital Sign    On average, how many days per week do you engage in moderate to strenuous exercise (like a brisk walk)?: 0 days    On average, how many minutes do you engage in exercise at this level?: 0 min  Stress: No Stress Concern Present (10/10/2021)   [employer] of Occupational Health - Occupational Stress Questionnaire    Feeling of Stress : Only a little  Social Connections: Socially Integrated (07/25/2024)   Social Connection and Isolation Panel    Frequency of Communication with Friends and Family: More than three times a week    Frequency of Social Gatherings with Friends and Family: Once a week    Attends Religious Services: More than 4 times per  year    Active Member of Clubs or Organizations: Yes    Attends Banker Meetings: More than 4 times per year    Marital Status: Married  Depression (PHQ2-9): Low Risk (07/31/2024)   Depression (PHQ2-9)    PHQ-2 Score: 0  Alcohol  Screen: Low Risk (10/10/2021)   Alcohol  Screen    Last Alcohol  Screening Score (AUDIT): 1  Housing: Unknown (07/31/2024)   Epic  Unable to Pay for Housing in the Last Year: No    Number of Times Moved in the Last Year: Not on file    Homeless in the Last Year: No  Utilities: Not At Risk (07/31/2024)   Epic    Threatened with loss of utilities: No  Health Literacy: Low Risk (12/31/2021)   Received from [facility] Literacy    How often do you need to have someone help you when you read instructions, pamphlets, or other written material from your doctor or pharmacy?: Never    Family History  Problem Relation Age of Onset   Cancer Mother    Diabetes Father    Heart Problems Father    COPD Father    Emphysema Father    Heart block Father    Congestive Heart Failure Father    Heart disease Father     Outpatient Encounter Medications as of 08/14/2024  Medication Sig   Accu-Chek Softclix Lancets lancets Use as instructed to test blood glucose four times daily   aspirin  81 MG chewable tablet Chew 1 tablet (81 mg total) by mouth daily.   busPIRone  (BUSPAR ) 5 MG tablet Take 5 mg by mouth at bedtime.   Cholecalciferol  (VITAMIN D3) 125 MCG (5000 UT) CAPS Take 1 capsule (5,000 Units total) by mouth daily.   citalopram  (CELEXA ) 20 MG tablet Take 1 tablet (20 mg total) by mouth every evening.   clopidogrel  (PLAVIX ) 75 MG tablet Take 1 tablet (75 mg total) by mouth daily with breakfast.   Continuous Glucose Receiver (FREESTYLE LIBRE 2 READER) DEVI USE TO CHECK GLUCOSE AS DIRECTED   Continuous Glucose Sensor (FREESTYLE LIBRE 2 SENSOR) MISC CHANGE SENSOR EVERY 14 DAYS USE  TO CHECK BLOOD GLUCOSE  CONTINUOUSLY   donepezil  (ARICEPT ) 5 MG  tablet Take 5 mg by mouth every evening.   empagliflozin  (JARDIANCE ) 25 MG TABS tablet Take 1 tablet (25 mg total) by mouth daily.   estradiol  (ESTRACE ) 1 MG tablet Take 1 mg by mouth at bedtime.   fenofibrate  160 MG tablet Take 1 tablet (160 mg total) by mouth daily.   gabapentin  (NEURONTIN ) 100 MG capsule Take 1 capsule (100 mg total) by mouth at bedtime.   glucose blood test strip 1 each by Other route as needed. Use as instructed to test blood glucose four times daily   Insulin  Pen Needle (B-D ULTRAFINE III SHORT PEN) 31G X 8 MM MISC 1 each by Does not apply route as directed.   isosorbide  mononitrate (IMDUR ) 30 MG 24 hr tablet Take 3 tablets (90 mg total) by mouth daily.   levocetirizine (XYZAL ) 5 MG tablet Take 5 mg by mouth every evening.   levothyroxine  (SYNTHROID ) 50 MCG tablet TAKE 1 TABLET BY MOUTH DAILY  BEFORE BREAKFAST   Magnesium  Oxide (MAGNESIUM  OXIDE 400) 240 MG PACK Take 400 mg by mouth daily.   metFORMIN  (GLUCOPHAGE ) 500 MG tablet TAKE 1 TABLET BY MOUTH DAILY  WITH BREAKFAST   modafinil  (PROVIGIL ) 100 MG tablet Take 1 tablet (100 mg total) by mouth daily.   montelukast  (SINGULAIR ) 10 MG tablet Take 10 mg by mouth at bedtime.   OVER THE COUNTER MEDICATION Take 2 capsules by mouth at bedtime. Omega XL   pantoprazole  (PROTONIX ) 40 MG tablet TAKE 2 TABLETS EVERY DAY   rOPINIRole  (REQUIP ) 2 MG tablet Take 2 mg by mouth at bedtime.   rosuvastatin  (CRESTOR ) 40 MG tablet Take 1 tablet (40 mg total) by mouth daily.   Vibegron  (GEMTESA )  75 MG TABS Take 1 tablet (75 mg total) by mouth daily.   warfarin (COUMADIN ) 5 MG tablet TAKE ONE-HALF TABLET TO 1 TABLET BY MOUTH  DAILY OR AS DIRECTED   [DISCONTINUED] HUMALOG  KWIKPEN 100 UNIT/ML KwikPen INJECT SUBCUTANEOUSLY 16 TO 22  UNITS INTO THE SKIN 3 TIMES  DAILY BEFORE MEALS   [DISCONTINUED] insulin  degludec (TRESIBA  FLEXTOUCH) 200 UNIT/ML FlexTouch Pen Inject 110 Units into the skin at bedtime.   [DISCONTINUED] Semaglutide ,0.25 or 0.5MG /DOS,  (OZEMPIC , 0.25 OR 0.5 MG/DOSE,) 2 MG/1.5ML SOPN Inject 0.5 mg into the skin every Thursday. (Patient taking differently: Inject 0.5 mg into the skin every Thursday.)   insulin  degludec (TRESIBA  FLEXTOUCH) 200 UNIT/ML FlexTouch Pen Inject 100 Units into the skin at bedtime.   insulin  lispro (HUMALOG  KWIKPEN) 100 UNIT/ML KwikPen Inject 10-16 Units into the skin 3 (three) times daily before meals.   [START ON 08/17/2024] Semaglutide ,0.25 or 0.5MG /DOS, (OZEMPIC , 0.25 OR 0.5 MG/DOSE,) 2 MG/1.5ML SOPN Inject 0.5 mg into the skin every Thursday.   No facility-administered encounter medications on file as of 08/14/2024.    ALLERGIES: Allergies  Allergen Reactions   Atomoxetine Nausea And Vomiting   Azithromycin Hives   Lisinopril Nausea And Vomiting   Atorvastatin Other (See Comments)    Restless legs   Codeine Nausea Only    VACCINATION STATUS: Immunization History  Administered Date(s) Administered   Fluad Quad(high Dose 65+) 05/14/2021   Moderna SARS-COV2 Booster Vaccination 06/13/2020   Moderna Sars-Covid-2 Vaccination 09/06/2019, 10/04/2019   Pneumococcal Polysaccharide-23 04/22/2017   Pneumococcal-Unspecified 05/31/2020   Tdap 04/22/2017   Zoster Recombinant(Shingrix) 11/30/2017, 02/20/2018    Diabetes She presents for her follow-up diabetic visit. She has type 2 diabetes mellitus. Onset time: Patient was diagnosed at approximate age of 49 years. Her disease course has been improving. There are no hypoglycemic associated symptoms. Pertinent negatives for hypoglycemia include no confusion, headaches, pallor or seizures. Pertinent negatives for diabetes include no blurred vision, no chest pain, no fatigue, no polydipsia, no polyphagia and no polyuria. There are no hypoglycemic complications. Symptoms are improving. Diabetic complications include heart disease and peripheral neuropathy. Risk factors for coronary artery disease include dyslipidemia, diabetes mellitus, family history, obesity,  post-menopausal, sedentary lifestyle, tobacco exposure and hypertension. Current diabetic treatment includes insulin  injections and oral agent (dual therapy). Her weight is fluctuating minimally. She is following a generally unhealthy diet. When asked about meal planning, she reported none. She never participates in exercise. Her home blood glucose trend is decreasing steadily. Her breakfast blood glucose range is generally 140-180 mg/dl. Her lunch blood glucose range is generally 140-180 mg/dl. Her dinner blood glucose range is generally 140-180 mg/dl. Her bedtime blood glucose range is generally 140-180 mg/dl. Her overall blood glucose range is 140-180 mg/dl. (She presents with her CGM, showing significant improvement in her glycemic profile averaging 154 for the most recent 2 weeks.  Her AGP report shows 73% time in range, 20% Libre 1 hyperglycemia, 7% level 2 hyperglycemia.  She has no hypoglycemia.  Her point-of-care A1c is 8.2%, progressively improving.  ) An ACE inhibitor/angiotensin II receptor blocker is not being taken. Eye exam is current.  Hyperlipidemia This is a chronic problem. The current episode started more than 1 year ago. The problem is uncontrolled. Exacerbating diseases include diabetes, hypothyroidism and obesity. Pertinent negatives include no chest pain, myalgias or shortness of breath. Current antihyperlipidemic treatment includes statins, fibric acid derivatives and bile acid sequestrants. Risk factors for coronary artery disease include family history, dyslipidemia, obesity, a sedentary lifestyle,  post-menopausal, hypertension and diabetes mellitus.  Hypertension This is a chronic problem. The current episode started more than 1 year ago. Pertinent negatives include no blurred vision, chest pain, headaches, palpitations or shortness of breath. Risk factors for coronary artery disease include dyslipidemia, diabetes mellitus, family history, obesity, post-menopausal state,  smoking/tobacco exposure and sedentary lifestyle. Past treatments include direct vasodilators. Hypertensive end-organ damage includes CAD/MI.    Review of systems  Constitutional: + Lost 8 pounds since March 2025 ,  current Body mass index is 37.49 kg/m. , no fatigue, no subjective hyperthermia, no subjective hypothermia Eyes: no blurry vision, no xerophthalmia    Objective:    BP 136/76   Pulse 68   Resp 18   Ht 5' 4 (1.626 m)   Wt 218 lb 6.4 oz (99.1 kg)   SpO2 99%   BMI 37.49 kg/m   Wt Readings from Last 3 Encounters:  08/14/24 218 lb 6.4 oz (99.1 kg)  08/11/24 220 lb (99.8 kg)  08/02/24 213 lb (96.6 kg)    BP Readings from Last 3 Encounters:  08/14/24 136/76  08/11/24 116/60  08/02/24 116/68     Physical Exam- Limited  Constitutional:  Body mass index is 37.49 kg/m. , not in acute distress, normal state of mind Eyes:  EOMI, no exophthalmos    CMP ( most recent) CMP     Component Value Date/Time   NA 136 07/29/2024 0350   NA 142 07/19/2024 0000   K 4.4 07/29/2024 0350   CL 103 07/29/2024 0350   CO2 27 07/29/2024 0350   GLUCOSE 143 (H) 07/29/2024 0350   BUN 16 07/29/2024 0350   BUN 19 07/19/2024 0000   CREATININE 1.16 (H) 07/29/2024 0350   CALCIUM  10.1 07/29/2024 0350   PROT 7.1 09/17/2023 1135   PROT 7.2 07/07/2023 1429   ALBUMIN  4.3 07/21/2024 0744   ALBUMIN  4.2 07/07/2023 1429   AST 32 07/19/2024 0000   ALT 20 07/19/2024 0000   ALKPHOS 63 07/19/2024 0000   BILITOT 0.5 09/17/2023 1135   BILITOT 0.4 07/07/2023 1429   GFRNONAA 51 (L) 07/29/2024 0350   GFRAA >90 07/09/2012 1736   Lipid Panel     Component Value Date/Time   CHOL 139 07/19/2024 0000   CHOL 138 06/11/2023 1620   TRIG 184 (A) 07/19/2024 0000   HDL 38 07/19/2024 0000   HDL 25 (L) 06/11/2023 1620   CHOLHDL 4.4 05/03/2024 0350   VLDL 40 05/03/2024 0350   LDLCALC 70 07/19/2024 0000   LDLCALC 44 06/11/2023 1620   LABVLDL 69 (H) 06/11/2023 1620    Recent Results (from the past  2160 hours)  POCT INR     Status: Abnormal   Collection Time: 05/17/24  2:04 PM  Result Value Ref Range   INR 3.6 (A) 2.0 - 3.0   POC INR    POCT INR     Status: Abnormal   Collection Time: 06/05/24  2:26 PM  Result Value Ref Range   INR 5.9 (A) 2.0 - 3.0   POC INR    POCT INR     Status: Normal   Collection Time: 06/14/24  1:59 PM  Result Value Ref Range   INR 2.7 2.0 - 3.0   POC INR    POCT INR     Status: Abnormal   Collection Time: 06/21/24  1:33 PM  Result Value Ref Range   INR 4.4 (A) 2.0 - 3.0   POC INR    POCT INR  Status: Abnormal   Collection Time: 07/03/24  1:31 PM  Result Value Ref Range   INR 3.1 (A) 2.0 - 3.0   POC INR    POCT INR     Status: Normal   Collection Time: 07/10/24  1:28 PM  Result Value Ref Range   INR 2.1 2.0 - 3.0   POC INR    Glucose, capillary     Status: Abnormal   Collection Time: 07/11/24  1:29 PM  Result Value Ref Range   Glucose-Capillary 141 (H) 70 - 99 mg/dL    Comment: Glucose reference range applies only to samples taken after fasting for at least 8 hours.  Glucose, capillary     Status: Abnormal   Collection Time: 07/11/24  2:27 PM  Result Value Ref Range   Glucose-Capillary 143 (H) 70 - 99 mg/dL    Comment: Glucose reference range applies only to samples taken after fasting for at least 8 hours.  Basic metabolic panel with GFR     Status: None   Collection Time: 07/19/24 12:00 AM  Result Value Ref Range   BUN 19 4 - 21   CO2 22 13 - 22   Creatinine 1.1 0.5 - 1.1   Potassium 4.1 3.5 - 5.1 mEq/L   Sodium 142 137 - 147   Chloride 103 99 - 108  Lipid panel     Status: Abnormal   Collection Time: 07/19/24 12:00 AM  Result Value Ref Range   Triglycerides 184 (A) 40 - 160   Cholesterol 139 0 - 200   HDL 38 35 - 70   LDL Cholesterol 70   Hepatic function panel     Status: None   Collection Time: 07/19/24 12:00 AM  Result Value Ref Range   Alkaline Phosphatase 63 25 - 125   ALT 20 7 - 35 U/L   AST 32 13 - 35    Bilirubin, Total 0.3   TSH     Status: None   Collection Time: 07/19/24 12:00 AM  Result Value Ref Range   TSH 3.35 0.41 - 5.90    Comment: T4, FREE 1.31  Comprehensive metabolic panel with GFR     Status: None   Collection Time: 07/21/24  7:44 AM  Result Value Ref Range   Globulin 2.7    Calcium  10.4 8.7 - 10.7   Albumin  4.3 3.5 - 5.0   EGFR 53.0     Comment: Abstracted by HIM  POCT INR     Status: Abnormal   Collection Time: 07/25/24 11:53 AM  Result Value Ref Range   INR 1.7 (A) 2.0 - 3.0   POC INR    Basic metabolic panel     Status: Abnormal   Collection Time: 07/25/24  3:21 PM  Result Value Ref Range   Sodium 138 135 - 145 mmol/L   Potassium 4.3 3.5 - 5.1 mmol/L   Chloride 102 98 - 111 mmol/L   CO2 23 22 - 32 mmol/L   Glucose, Bld 144 (H) 70 - 99 mg/dL    Comment: Glucose reference range applies only to samples taken after fasting for at least 8 hours.   BUN 20 8 - 23 mg/dL   Creatinine, Ser 8.76 (H) 0.44 - 1.00 mg/dL   Calcium  10.6 (H) 8.9 - 10.3 mg/dL   GFR, Estimated 47 (L) >60 mL/min    Comment: (NOTE) Calculated using the CKD-EPI Creatinine Equation (2021)    Anion gap 14 5 - 15  Comment: Performed at [facility], [address] [place], [place] [address]  CBC     Status: None   Collection Time: 07/25/24  3:21 PM  Result Value Ref Range   WBC 9.0 4.0 - 10.5 K/uL   RBC 4.99 3.87 - 5.11 MIL/uL   Hemoglobin 14.4 12.0 - 15.0 g/dL   HCT 54.7 63.9 - 53.9 %   MCV 90.6 80.0 - 100.0 fL   MCH 28.9 26.0 - 34.0 pg   MCHC 31.9 30.0 - 36.0 g/dL   RDW 86.3 88.4 - 84.4 %   Platelets 335 150 - 400 K/uL   nRBC 0.0 0.0 - 0.2 %    Comment: Performed at [facility], [address], [place], [place] [address]  Troponin T, High Sensitivity     Status: None   Collection Time: 07/25/24  3:21 PM  Result Value Ref Range   Troponin T High Sensitivity 17 0 - 19 ng/L    Comment: (NOTE) Biotin concentrations > 1000 ng/mL falsely decrease TnT results.  Serial cardiac troponin  measurements are suggested.  Refer to the Links section for chest pain algorithms and additional  guidance. Performed at [facility], [address], [place], [place] [address]   Protime-INR (order if Patient is taking Coumadin  / Warfarin)     Status: Abnormal   Collection Time: 07/25/24  3:21 PM  Result Value Ref Range   Prothrombin Time 18.5 (H) 11.4 - 15.2 seconds   INR 1.5 (H) 0.8 - 1.2    Comment: (NOTE) INR goal varies based on device and disease states. Performed at [facility], [address], [place], [place] [address]   Pro Brain natriuretic peptide     Status: None   Collection Time: 07/25/24  3:21 PM  Result Value Ref Range   Pro Brain Natriuretic Peptide 235.0 <300.0 pg/mL    Comment: (NOTE) Age Group        Cut-Points    Interpretation  < 50 years     450 pg/mL       NT-proBNP > 450 pg/mL indicates                                ADHF is likely              50 to 75 years  900 pg/mL      NT-proBNP > 900 pg/mL indicates          ADHF is likely  > 75 years      1800 pg/mL     NT-proBNP > 1800 pg/mL indicates          ADHF is likely                           All ages    Results between       Indeterminate. Further clinical             300 and the cut-   information is needed to determine            point for age group   if ADHF is present.  Elecsys proBNP II/ Elecsys proBNP II STAT           Cut-Point                       Interpretation  300 pg/mL                    NT-proBNP <300pg/mL indicates                             ADHF is not likely  Performed at [facility], [address] [place], [place] [address]   Troponin T, High Sensitivity     Status: None   Collection Time: 07/25/24  5:32 PM  Result Value Ref Range   Troponin T High Sensitivity 15 0 - 19 ng/L    Comment: (NOTE) Biotin concentrations > 1000 ng/mL falsely decrease TnT results.  Serial cardiac troponin measurements are  suggested.  Refer to the Links section for chest pain algorithms and additional  guidance. Performed at [facility], [address], [place], [place] [address]   Glucose, capillary     Status: Abnormal   Collection Time: 07/25/24  8:15 PM  Result Value Ref Range   Glucose-Capillary 133 (H) 70 - 99 mg/dL    Comment: Glucose reference range applies only to samples taken after fasting for at least 8 hours.  Protime-INR     Status: Abnormal   Collection Time: 07/26/24  1:58 AM  Result Value Ref Range   Prothrombin Time 19.0 (H) 11.4 - 15.2 seconds   INR 1.5 (H) 0.8 - 1.2    Comment: (NOTE) INR goal varies based on device and disease states. Performed at [facility], [address] [place], [place] [address]   CBC     Status: None   Collection Time: 07/26/24  1:58 AM  Result Value Ref Range   WBC 9.1 4.0 - 10.5 K/uL   RBC 4.61 3.87 - 5.11 MIL/uL   Hemoglobin 13.4 12.0 - 15.0 g/dL   HCT 57.8 63.9 - 53.9 %   MCV 91.3 80.0 - 100.0 fL   MCH 29.1 26.0 - 34.0 pg   MCHC 31.8 30.0 - 36.0 g/dL   RDW 86.4 88.4 - 84.4 %   Platelets 306 150 - 400 K/uL   nRBC 0.0 0.0 - 0.2 %    Comment: Performed at [facility], [address] [place], [place] [address]  Basic metabolic panel     Status: Abnormal   Collection Time: 07/26/24  1:58 AM  Result Value Ref Range   Sodium 138 135 - 145 mmol/L   Potassium 4.7 3.5 - 5.1 mmol/L   Chloride 101 98 - 111 mmol/L   CO2 31 22 - 32 mmol/L   Glucose, Bld 163 (H) 70 - 99 mg/dL    Comment: Glucose reference range applies only to samples taken after fasting for at least 8 hours.   BUN 19 8 - 23 mg/dL   Creatinine, Ser 8.84 (H) 0.44 - 1.00 mg/dL   Calcium  9.7 8.9 - 10.3 mg/dL   GFR, Estimated 51 (L) >60 mL/min    Comment: (NOTE) Calculated using the CKD-EPI Creatinine Equation (2021)    Anion gap 6 5 - 15    Comment: Performed at [facility], [address] [place], [place] [address]  Magnesium      Status: Abnormal   Collection Time: 07/26/24  1:58  AM  Result Value Ref  Range   Magnesium  2.6 (H) 1.7 - 2.4 mg/dL    Comment: Performed at [facility], [address] [place], [place] [address]  Phosphorus     Status: None   Collection Time: 07/26/24  1:58 AM  Result Value Ref Range   Phosphorus 3.8 2.5 - 4.6 mg/dL    Comment: Performed at [facility], [address] [place], [place] [address]  Troponin T, High Sensitivity     Status: None   Collection Time: 07/26/24  1:58 AM  Result Value Ref Range   Troponin T High Sensitivity 16 0 - 19 ng/L    Comment: (NOTE) Biotin concentrations > 1000 ng/mL falsely decrease TnT results.  Serial cardiac troponin measurements are suggested.  Refer to the Links section for chest pain algorithms and additional  guidance. Performed at [facility], [address] [place], [place] [address]   Troponin T, High Sensitivity     Status: None   Collection Time: 07/26/24  4:04 AM  Result Value Ref Range   Troponin T High Sensitivity 18 0 - 19 ng/L    Comment: (NOTE) Biotin concentrations > 1000 ng/mL falsely decrease TnT results.  Serial cardiac troponin measurements are suggested.  Refer to the Links section for chest pain algorithms and additional  guidance. Performed at [facility], [address] [place], [place] [address]   Glucose, capillary     Status: Abnormal   Collection Time: 07/26/24  4:38 AM  Result Value Ref Range   Glucose-Capillary 133 (H) 70 - 99 mg/dL    Comment: Glucose reference range applies only to samples taken after fasting for at least 8 hours.  Glucose, capillary     Status: Abnormal   Collection Time: 07/26/24  7:52 AM  Result Value Ref Range   Glucose-Capillary 181 (H) 70 - 99 mg/dL    Comment: Glucose reference range applies only to samples taken after fasting for at least 8 hours.   Comment 1 Notify RN    Comment 2 Document in Chart   Glucose, capillary     Status: Abnormal   Collection Time: 07/26/24 11:16 AM  Result Value Ref Range   Glucose-Capillary 171  (H) 70 - 99 mg/dL    Comment: Glucose reference range applies only to samples taken after fasting for at least 8 hours.  Glucose, capillary     Status: Abnormal   Collection Time: 07/26/24  4:08 PM  Result Value Ref Range   Glucose-Capillary 153 (H) 70 - 99 mg/dL    Comment: Glucose reference range applies only to samples taken after fasting for at least 8 hours.  Heparin  level (unfractionated)     Status: None   Collection Time: 07/26/24  4:40 PM  Result Value Ref Range   Heparin  Unfractionated 0.34 0.30 - 0.70 IU/mL    Comment: (NOTE) The clinical reportable range upper limit is being lowered to >1.10 to align with the FDA approved guidance for the current laboratory assay.  If heparin  results are below expected values, and patient dosage has  been confirmed, suggest follow up testing of antithrombin III levels. Performed at [facility], [address] [place], [place] [address]   ECHOCARDIOGRAM COMPLETE     Status: None   Collection Time: 07/26/24  5:25 PM  Result Value Ref Range   Weight 3,495.61 oz   Height 64 in   BP 119/49 mmHg   AR max vel 1.10 cm2   AV Area VTI 1.78 cm2   AV Mean grad 7.0  mmHg   AV Peak grad 20.1 mmHg   Ao pk vel 2.24 m/s   AV Area mean vel 1.53 cm2   Area-P 1/2 2.83 cm2   S' Lateral 2.70 cm   Est EF 60 - 65%   Glucose, capillary     Status: Abnormal   Collection Time: 07/26/24  7:44 PM  Result Value Ref Range   Glucose-Capillary 219 (H) 70 - 99 mg/dL    Comment: Glucose reference range applies only to samples taken after fasting for at least 8 hours.  Heparin  level (unfractionated)     Status: Abnormal   Collection Time: 07/26/24 10:43 PM  Result Value Ref Range   Heparin  Unfractionated 0.24 (L) 0.30 - 0.70 IU/mL    Comment: (NOTE) The clinical reportable range upper limit is being lowered to >1.10 to align with the FDA approved guidance for the current laboratory assay.  If heparin  results are below expected values, and patient dosage has   been confirmed, suggest follow up testing of antithrombin III levels. Performed at [facility], [address] [place], [place] [address]   Glucose, capillary     Status: Abnormal   Collection Time: 07/27/24  7:41 AM  Result Value Ref Range   Glucose-Capillary 155 (H) 70 - 99 mg/dL    Comment: Glucose reference range applies only to samples taken after fasting for at least 8 hours.  CBC     Status: None   Collection Time: 07/27/24  7:48 AM  Result Value Ref Range   WBC 7.7 4.0 - 10.5 K/uL   RBC 4.72 3.87 - 5.11 MIL/uL   Hemoglobin 13.9 12.0 - 15.0 g/dL   HCT 56.3 63.9 - 53.9 %   MCV 92.4 80.0 - 100.0 fL   MCH 29.4 26.0 - 34.0 pg   MCHC 31.9 30.0 - 36.0 g/dL   RDW 86.5 88.4 - 84.4 %   Platelets 296 150 - 400 K/uL   nRBC 0.0 0.0 - 0.2 %    Comment: Performed at [facility], [address] [place], [place] [address]  Heparin  level (unfractionated)     Status: Abnormal   Collection Time: 07/27/24  7:48 AM  Result Value Ref Range   Heparin  Unfractionated <0.10 (L) 0.30 - 0.70 IU/mL    Comment: (NOTE) The clinical reportable range upper limit is being lowered to >1.10 to align with the FDA approved guidance for the current laboratory assay.  If heparin  results are below expected values, and patient dosage has  been confirmed, suggest follow up testing of antithrombin III levels. Performed at [facility], [address], [place], [place] [address]   Protime-INR     Status: Abnormal   Collection Time: 07/27/24  7:48 AM  Result Value Ref Range   Prothrombin Time 22.7 (H) 11.4 - 15.2 seconds   INR 1.9 (H) 0.8 - 1.2    Comment: (NOTE) INR goal varies based on device and disease states. Performed at [facility], [address], [place], [place] [address]   Glucose, capillary     Status: Abnormal   Collection Time: 07/27/24 11:12 AM  Result Value Ref Range   Glucose-Capillary 213 (H) 70 - 99 mg/dL    Comment: Glucose reference range applies only to samples taken after fasting  for at least 8 hours.  Glucose, capillary     Status: Abnormal   Collection Time: 07/27/24  4:41 PM  Result Value Ref Range   Glucose-Capillary 225 (H) 70 - 99 mg/dL  Comment: Glucose reference range applies only to samples taken after fasting for at least 8 hours.   Comment 1 Notify RN    Comment 2 Document in Chart   Heparin  level (unfractionated)     Status: None   Collection Time: 07/27/24  4:57 PM  Result Value Ref Range   Heparin  Unfractionated 0.38 0.30 - 0.70 IU/mL    Comment: (NOTE) The clinical reportable range upper limit is being lowered to >1.10 to align with the FDA approved guidance for the current laboratory assay.  If heparin  results are below expected values, and patient dosage has  been confirmed, suggest follow up testing of antithrombin III levels. Performed at [facility], [address], [place], [place] [address]   Glucose, capillary     Status: Abnormal   Collection Time: 07/27/24  7:34 PM  Result Value Ref Range   Glucose-Capillary 232 (H) 70 - 99 mg/dL    Comment: Glucose reference range applies only to samples taken after fasting for at least 8 hours.  CBC     Status: None   Collection Time: 07/28/24  5:42 AM  Result Value Ref Range   WBC 7.3 4.0 - 10.5 K/uL   RBC 4.75 3.87 - 5.11 MIL/uL   Hemoglobin 13.9 12.0 - 15.0 g/dL   HCT 57.0 63.9 - 53.9 %   MCV 90.3 80.0 - 100.0 fL   MCH 29.3 26.0 - 34.0 pg   MCHC 32.4 30.0 - 36.0 g/dL   RDW 86.3 88.4 - 84.4 %   Platelets 312 150 - 400 K/uL   nRBC 0.0 0.0 - 0.2 %    Comment: Performed at [facility], [address], [place], [place] [address]  Heparin  level (unfractionated)     Status: None   Collection Time: 07/28/24  5:42 AM  Result Value Ref Range   Heparin  Unfractionated 0.41 0.30 - 0.70 IU/mL    Comment: (NOTE) The clinical reportable range upper limit is being lowered to >1.10 to align with the FDA approved guidance for the current laboratory assay.  If heparin  results are below expected  values, and patient dosage has  been confirmed, suggest follow up testing of antithrombin III levels. Performed at [facility], [address], [place], [place] [address]   Protime-INR     Status: Abnormal   Collection Time: 07/28/24  5:42 AM  Result Value Ref Range   Prothrombin Time 22.5 (H) 11.4 - 15.2 seconds   INR 1.9 (H) 0.8 - 1.2    Comment: (NOTE) INR goal varies based on device and disease states. Performed at [facility], [address] [place], [place] [address]   Glucose, capillary     Status: Abnormal   Collection Time: 07/28/24  7:27 AM  Result Value Ref Range   Glucose-Capillary 176 (H) 70 - 99 mg/dL    Comment: Glucose reference range applies only to samples taken after fasting for at least 8 hours.   Comment 1 Notify RN    Comment 2 Document in Chart   POCT Activated clotting time     Status: None   Collection Time: 07/28/24 11:39 AM  Result Value Ref Range   Activated Clotting Time 307 seconds    Comment: Reference range 74-137 seconds for patients not on anticoagulant therapy.  POCT Activated clotting time     Status: None   Collection Time: 07/28/24 12:04 PM  Result Value Ref Range   Activated Clotting Time 256 seconds    Comment: Reference range 74-137 seconds  for patients not on anticoagulant therapy.  POCT Activated clotting time     Status: None   Collection Time: 07/28/24  1:55 PM  Result Value Ref Range   Activated Clotting Time 189 seconds    Comment: Reference range 74-137 seconds for patients not on anticoagulant therapy.  POCT Activated clotting time     Status: None   Collection Time: 07/28/24  2:46 PM  Result Value Ref Range   Activated Clotting Time 189 seconds    Comment: Reference range 74-137 seconds for patients not on anticoagulant therapy.  Glucose, capillary     Status: Abnormal   Collection Time: 07/28/24  3:01 PM  Result Value Ref Range   Glucose-Capillary 167 (H) 70 - 99 mg/dL    Comment: Glucose reference range applies only to  samples taken after fasting for at least 8 hours.  POCT Activated clotting time     Status: None   Collection Time: 07/28/24  3:02 PM  Result Value Ref Range   Activated Clotting Time 179 seconds    Comment: Reference range 74-137 seconds for patients not on anticoagulant therapy.  POCT Activated clotting time     Status: None   Collection Time: 07/28/24  3:34 PM  Result Value Ref Range   Activated Clotting Time 163 seconds    Comment: Reference range 74-137 seconds for patients not on anticoagulant therapy.  Glucose, capillary     Status: Abnormal   Collection Time: 07/28/24  6:04 PM  Result Value Ref Range   Glucose-Capillary 175 (H) 70 - 99 mg/dL    Comment: Glucose reference range applies only to samples taken after fasting for at least 8 hours.  Glucose, capillary     Status: Abnormal   Collection Time: 07/28/24  9:01 PM  Result Value Ref Range   Glucose-Capillary 213 (H) 70 - 99 mg/dL    Comment: Glucose reference range applies only to samples taken after fasting for at least 8 hours.  CBC     Status: None   Collection Time: 07/29/24  3:50 AM  Result Value Ref Range   WBC 8.5 4.0 - 10.5 K/uL   RBC 4.64 3.87 - 5.11 MIL/uL   Hemoglobin 13.7 12.0 - 15.0 g/dL   HCT 58.5 63.9 - 53.9 %   MCV 89.2 80.0 - 100.0 fL   MCH 29.5 26.0 - 34.0 pg   MCHC 33.1 30.0 - 36.0 g/dL   RDW 86.1 88.4 - 84.4 %   Platelets 289 150 - 400 K/uL   nRBC 0.0 0.0 - 0.2 %    Comment: Performed at [facility], [address] [place], [place] [address]  Protime-INR     Status: Abnormal   Collection Time: 07/29/24  3:50 AM  Result Value Ref Range   Prothrombin Time 19.0 (H) 11.4 - 15.2 seconds   INR 1.5 (H) 0.8 - 1.2    Comment: (NOTE) INR goal varies based on device and disease states. Performed at [facility], [address], [place], [place] [address]   Basic metabolic panel     Status: Abnormal   Collection Time: 07/29/24  3:50 AM  Result Value Ref Range   Sodium 136 135 - 145  mmol/L   Potassium 4.4 3.5 - 5.1 mmol/L   Chloride 103 98 - 111 mmol/L   CO2 27 22 - 32 mmol/L   Glucose, Bld 143 (H) 70 - 99 mg/dL    Comment: Glucose reference range applies only to samples taken  after fasting for at least 8 hours.   BUN 16 8 - 23 mg/dL   Creatinine, Ser 8.83 (H) 0.44 - 1.00 mg/dL   Calcium  10.1 8.9 - 10.3 mg/dL   GFR, Estimated 51 (L) >60 mL/min    Comment: (NOTE) Calculated using the CKD-EPI Creatinine Equation (2021)    Anion gap 6 5 - 15    Comment: Performed at [facility], [address], [place], [place] [address]  Glucose, capillary     Status: Abnormal   Collection Time: 07/29/24  7:22 AM  Result Value Ref Range   Glucose-Capillary 156 (H) 70 - 99 mg/dL    Comment: Glucose reference range applies only to samples taken after fasting for at least 8 hours.  POCT INR     Status: Abnormal   Collection Time: 08/03/24 10:01 AM  Result Value Ref Range   INR 1.6 (A) 2.0 - 3.0   POC INR    POCT INR     Status: Normal   Collection Time: 08/07/24  1:34 PM  Result Value Ref Range   INR 2.1 2.0 - 3.0   POC INR    POCT glycosylated hemoglobin (Hb A1C)     Status: Abnormal   Collection Time: 08/14/24  9:27 AM  Result Value Ref Range   Hemoglobin A1C 8.2 (A) 4.0 - 5.6 %   HbA1c POC (<> result, manual entry)     HbA1c, POC (prediabetic range)     HbA1c, POC (controlled diabetic range)       Assessment & Plan:   1) DM type 2 causing vascular disease (HCC)  - GENELLE ECONOMOU has currently uncontrolled symptomatic type 2 DM since  68 years of age.  She presents with her CGM, showing significant improvement in her glycemic profile averaging 154 for the most recent 2 weeks.  Her AGP report shows 73% time in range, 20% Libre 1 hyperglycemia, 7% level 2 hyperglycemia.  She has no hypoglycemia.  Her point-of-care A1c is 8.2%, progressively improving.    Recent labs reviewed.  - I had a long discussion with her about the progressive nature of diabetes and the  pathology behind its complications. -her diabetes is complicated by coronary artery disease, peripheral neuropathy, obesity, sedentary life, nonalcoholic fatty liver disease and she remains at extremely  high risk for more acute and chronic complications which include CAD, CVA, CKD, retinopathy, and neuropathy. These are all discussed in detail with her.  - I discussed all available options of managing her diabetes including de-escalation of medications.  Considering her associated comorbidities including sleep apnea requiring CPAP, nonalcoholic fatty liver disease, hypertension, hyperlipidemia, she is a perfect candidate for lifestyle medicine.    I have counseled her on diet  and weight management  by adopting a Whole Food , Plant Predominant  ( WFPP) nutrition as recommended by [employer] of Lifestyle Medicine. Patient is encouraged to switch to  unprocessed or minimally processed  complex starch, adequate protein intake (mainly plant source), minimal liquid fat ( mainly vegetable oils), plenty of fruits, and vegetables. -  she is advised to stick to a routine mealtimes to eat 3 complete meals a day and snack only when necessary ( to snack only to correct hypoglycemia BG <70 day time or <100 at night).   -Her engagement with lifestyle medicine nutrition is suboptimal, however,  she acknowledges that there is a room for improvement in her food and drink choices.  - she acknowledges that there is a  room for improvement in her food and drink choices. - Suggestion is made for her to avoid simple carbohydrates  from her diet including Cakes, Sweet Desserts, Ice Cream, Soda (diet and regular), Sweet Tea, Candies, Chips, Cookies, Store Bought Juices, Alcohol  , Artificial Sweeteners,  Coffee Creamer, and Sugar-free Products, Lemonade. This will help patient to have more stable blood glucose profile and potentially avoid unintended weight gain.   - she has been scheduled with Penny Crumpton, RDN, CDE  for individualized diabetes education.  - I have approached her with the following plan to manage  her diabetes and patient agrees:   - In light of her presentation with significant improvement in her glycemic profile, she will be considered for lower dose of insulin .  I discussed and lowered her Tresiba  to 100 units nightly, discussed the morning NovoLog  15 g 16 units 3 times daily before meals when Premeal blood glucose readings are above 90 mg per DL. She is benefiting from her CGM, advised to use it continuously. - She has tolerated Jardiance , discussed and increase Jardiance  to 25 mg p.o. daily at breakfast.   She is benefiting from the sample of Ozempic  she was given from clinic.  Advised to continue Ozempic   0.5 mg subcutaneously weekly.   She is encouraged to call clinic for hypoglycemia under 70 or hyperglycemia above 200 mg per DL of weekly average.  - Specific targets for  A1c;  LDL, HDL,  and Triglycerides were discussed with the patient.  2) Blood Pressure /Hypertension:   Her blood pressure is controlled to target.  she is advised to continue her current medications including Imdur  60 mg p.o. daily with breakfast .  3) Lipids/Hyperlipidemia:    Her most recent lipid panel showed improved LDL to 44 from 120.  She is advised to continue her multidrug modality including fenofibrate  160 mg p.o. daily, Crestor  10 g p.o. nightly .    She is hesitant to engage with lifestyle medicine for now.  Side effects and precautions discussed with her.  4)  Weight/Diet:   Her Body mass index is 37.49 kg/m.  -   clearly complicating her diabetes care.   she is  a candidate for weight loss. I discussed with her the fact that loss of 5 - 10% of her  current body weight will have the most impact on her diabetes management.  The above detailed  ACLM recommendations for nutrition, exercise, sleep, social life, avoidance of risky substances, the need for restorative sleep   information will also detailed  on discharge instructions.  5) Hypothyroidism:  The circumstances of her diagnosis are not available to review.  Her recent labs were consistent with appropriate replacement.  She is advised to continue levothyroxine  50 mcg p.o. daily before breakfast.    - We discussed about the correct intake of her thyroid  hormone, on empty stomach at fasting, with water , separated by at least 30 minutes from breakfast and other medications,  and separated by more than 4 hours from calcium , iron, multivitamins, acid reflux medications (PPIs). -Patient is made aware of the fact that thyroid  hormone replacement is needed for life, dose to be adjusted by periodic monitoring of thyroid  function tests.  6) Chronic Care/Health Maintenance: -she is on Statin medications and  is encouraged to initiate and continue to follow up with Ophthalmology, Dentist,  Podiatrist at least yearly or according to recommendations, and advised to   stay away from smoking. I have recommended yearly flu vaccine and pneumonia vaccine at  least every 5 years; moderate intensity exercise for up to 150 minutes weekly; and  sleep for 7- 9 hours a day.  -She will need to have vitamin D  supplement with vitamin D3 5000 units daily x90 days. She had normal diabetic foot exam on 2 March 23, 2024. - she is advised to maintain close follow up with Skillman, Katherine E, PA-C for primary care needs, as well as her other providers for optimal and coordinated care.   I spent  40  minutes in the care of the patient today including review of labs from CMP, Lipids, Thyroid  Function, Hematology (current and previous including abstractions from other facilities); face-to-face time discussing  her blood glucose readings/logs, discussing hypoglycemia and hyperglycemia episodes and symptoms, medications doses, her options of short and long term treatment based on the latest standards of care / guidelines;  discussion about incorporating lifestyle medicine;  and  documenting the encounter. Risk reduction counseling performed per USPSTF guidelines to reduce  obesity and cardiovascular risk factors.     Please refer to Patient Instructions for Blood Glucose Monitoring and Insulin /Medications Dosing Guide  in media tab for additional information. Please  also refer to  Patient Self Inventory in the Media  tab for reviewed elements of pertinent patient history.  Graceanna Theissen Deacon participated in the discussions, expressed understanding, and voiced agreement with the above plans.  All questions were answered to her satisfaction. she is encouraged to contact clinic should she have any questions or concerns prior to her return visit.   Follow up plan: - Return in about 3 months (around 11/12/2024) for Bring Meter/CGM Device/Logs- A1c in Office.  Benton Rio, [facility] [address] [place], [place] [address] Phone: [phone] Fax: [phone]  08/14/2024, 10:21 AM    "

## 2024-08-14 NOTE — Progress Notes (Signed)
 Patient called this morning wanting to explain about a recent injury she has had to her back. She is awaiting to get into the doctor to be seen and figure out what is going on. She wants to be on hold until mid January to give her back time to heal and figure out what is going on. Took out appointments until Jan. 20th.

## 2024-08-14 NOTE — Telephone Encounter (Signed)
 Made note earlier.

## 2024-08-15 ENCOUNTER — Encounter (HOSPITAL_COMMUNITY)

## 2024-08-15 ENCOUNTER — Encounter (HOSPITAL_COMMUNITY): Payer: Self-pay | Admitting: *Deleted

## 2024-08-15 DIAGNOSIS — I214 Non-ST elevation (NSTEMI) myocardial infarction: Secondary | ICD-10-CM

## 2024-08-15 DIAGNOSIS — Z951 Presence of aortocoronary bypass graft: Secondary | ICD-10-CM

## 2024-08-15 NOTE — Progress Notes (Signed)
 Cardiac Individual Treatment Plan  Patient Details  Name: Rachel Odonnell MRN: [id number] Date of Birth: 05-29-1956 Referring Provider:   Flowsheet Row CARDIAC REHAB PHASE II ORIENTATION from 06/28/2024 in [facility] CARDIAC REHABILITATION  Referring Provider Alvan Carrier MD    Initial Encounter Date:  Flowsheet Row CARDIAC REHAB PHASE II ORIENTATION from 06/28/2024 in [place] CARDIAC REHABILITATION  Date 06/28/24    Visit Diagnosis: NSTEMI (non-ST elevated myocardial infarction) (HCC)  S/P CABG x 1  Patient's Home Medications on Admission: Current Medications[1]  Past Medical History: Past Medical History:  Diagnosis Date   ADD (attention deficit disorder)    Allergy ?   Ive had seasonal allergies for years.   Anxiety 2006   Thats when I was first put on Celexa .   Arthritis Not sure   CAD (coronary artery disease)    Diabetes mellitus without complication (HCC)    I dont remember.   Emphysema of lung (HCC) 2022   Mild case   GERD (gastroesophageal reflux disease) Not sure   Heart murmur Not sure   Hypertension    PAF (paroxysmal atrial fibrillation) (HCC)    Sleep apnea 2022   Thyroid  disease 2000    Tobacco Use: Tobacco Use History[2]  Labs: Review Flowsheet  More data exists      Latest Ref Rng & Units 05/05/2024 05/06/2024 05/07/2024 07/19/2024 08/14/2024  Labs for ITP Cardiac and Pulmonary Rehab  Cholestrol 0 - 200 - - - 139     -  LDL (calc) - - - - 70     -  HDL-C 35 - 70 - - - 38     -  Trlycerides 40 - 160 - - - 184     -  Hemoglobin A1c 4.0 - 5.6 % 9.2  - - - 8.2   PH, Arterial 7.35 - 7.45 - 7.329  7.281  7.240  7.295  7.255  7.387  7.347  - -  PCO2 arterial 32 - 48 mmHg - 49.0  51.2  54.2  45.8  51.9  38.9  47.3  - -  Bicarbonate 20.0 - 28.0 mmol/L - 25.7  23.7  22.8  22.2  23.0  23.6  25.9  - -  TCO2 22 - 32 mmol/L - 27  25  24  24  25  25  25  26  27  27   - -  Acid-base deficit 0.0 - 2.0 mmol/L - 1.0  3.0  5.0  4.0  4.0  1.0  - - -  O2  Saturation % - 98  97  92  92  90  97  97  - -    Details       This result is from an external source.   Multiple values from one day are sorted in reverse-chronological order         Capillary Blood Glucose: Lab Results  Component Value Date   GLUCAP 156 (H) 07/29/2024   GLUCAP 213 (H) 07/28/2024   GLUCAP 175 (H) 07/28/2024   GLUCAP 167 (H) 07/28/2024   GLUCAP 176 (H) 07/28/2024     Exercise Target Goals: Exercise Program Goal: Individual exercise prescription set using results from initial 6 min walk test and THRR while considering  patients activity barriers and safety.   Exercise Prescription Goal: Starting with aerobic activity 30 plus minutes a day, 3 days per week for initial exercise prescription. Provide home exercise prescription and guidelines that participant acknowledges understanding prior to discharge.  Activity Barriers & Risk Stratification:  Activity Barriers & Cardiac Risk Stratification - 06/28/24 1419       Activity Barriers & Cardiac Risk Stratification   Activity Barriers Arthritis;Back Problems;Deconditioning;Muscular Weakness;Incisional Pain;Chest Pain/Angina    Cardiac Risk Stratification High          6 Minute Walk:  6 Minute Walk     Row Name 06/28/24 1519         6 Minute Walk   Phase Initial     Distance 800 feet     Walk Time 6 minutes     # of Rest Breaks 1     MPH 1.52     METS 1.42     RPE 12     VO2 Peak 4.97     Symptoms Yes (comment)     Comments BL hip pain 4/10     Resting HR 79 bpm     Resting BP 122/70     Resting Oxygen Saturation  97 %     Exercise Oxygen Saturation  during 6 min walk 97 %     Max Ex. HR 85 bpm     Max Ex. BP 134/60     2 Minute Post BP 120/68        Oxygen Initial Assessment:   Oxygen Re-Evaluation:   Oxygen Discharge (Final Oxygen Re-Evaluation):   Initial Exercise Prescription:  Initial Exercise Prescription - 06/28/24 1500       Date of Initial Exercise RX and Referring  Provider   Date 06/28/24    Referring Provider Alvan Carrier MD      Oxygen   Maintain Oxygen Saturation 88% or higher      NuStep   Level 1    SPM 50    Minutes 15    METs 1.9      Arm Ergometer   Level 1    RPM 30    Minutes 15    METs 1.9      Prescription Details   Frequency (times per week) 2    Duration Progress to 30 minutes of continuous aerobic without signs/symptoms of physical distress      Intensity   THRR 40-80% of Max Heartrate 108-137    Ratings of Perceived Exertion 11-13    Perceived Dyspnea 0-4      Resistance Training   Training Prescription Yes    Weight 3    Reps 10-15          Perform Capillary Blood Glucose checks as needed.  Exercise Prescription Changes:   Exercise Prescription Changes     Row Name 07/20/24 1500             Response to Exercise   Blood Pressure (Admit) 102/44       Blood Pressure (Exercise) 130/50       Blood Pressure (Exit) 102/50       Heart Rate (Admit) 95 bpm       Heart Rate (Exercise) 100 bpm       Heart Rate (Exit) 81 bpm       Rating of Perceived Exertion (Exercise) 13       Duration Continue with 30 min of aerobic exercise without signs/symptoms of physical distress.       Intensity THRR unchanged         Progression   Progression Continue to progress workloads to maintain intensity without signs/symptoms of physical distress.         Resistance Training  Training Prescription Yes       Weight 3       Reps 10-15         NuStep   Level 2       SPM 103       Minutes 15       METs 2.2         Arm Ergometer   Level 1       RPM 57       Minutes 15       METs 1.9          Exercise Comments:   Exercise Comments     Row Name 06/28/24 1500 06/28/24 1506 07/11/24 1339       Exercise Comments Nathanel doesn't do much exercise at home besides a little walking. Patient attend orientation today.  Patient is attending Cardiac Rehabilitation Program.  Documentation for diagnosis can be found  in [facility].  Reviewed medical chart, RPE/RPD, gym safety, and program guidelines.  Patient was fitted to equipment they will be using during rehab.  Patient is scheduled to start exercise on 07/06/24.   Initial ITP created and sent for review and signature by Dr. Dorn Ross, Medical Director for Cardiac Rehabilitation Program. First full day of exercise!  Patient was oriented to gym and equipment including functions, settings, policies, and procedures.  Patient's individual exercise prescription and treatment plan were reviewed.  All starting workloads were established based on the results of the 6 minute walk test done at initial orientation visit.  The plan for exercise progression was also introduced and progression will be customized based on patient's performance and goals.        Exercise Goals and Review:   Exercise Goals     Row Name 06/28/24 1500             Exercise Goals   Increase Physical Activity Yes       Intervention Provide advice, education, support and counseling about physical activity/exercise needs.;Develop an individualized exercise prescription for aerobic and resistive training based on initial evaluation findings, risk stratification, comorbidities and participant's personal goals.       Expected Outcomes Short Term: Attend rehab on a regular basis to increase amount of physical activity.;Long Term: Add in home exercise to make exercise part of routine and to increase amount of physical activity.;Long Term: Exercising regularly at least 3-5 days a week.       Increase Strength and Stamina Yes       Intervention Provide advice, education, support and counseling about physical activity/exercise needs.;Develop an individualized exercise prescription for aerobic and resistive training based on initial evaluation findings, risk stratification, comorbidities and participant's personal goals.       Expected Outcomes Short Term: Increase workloads from initial exercise  prescription for resistance, speed, and METs.;Long Term: Improve cardiorespiratory fitness, muscular endurance and strength as measured by increased METs and functional capacity ( );Short Term: Perform resistance training exercises routinely during rehab and add in resistance training at home       Able to understand and use rate of perceived exertion (RPE) scale Yes       Intervention Provide education and explanation on how to use RPE scale       Expected Outcomes Short Term: Able to use RPE daily in rehab to express subjective intensity level;Long Term:  Able to use RPE to guide intensity level when exercising independently       Able to understand and use Dyspnea scale Yes  Intervention Provide education and explanation on how to use Dyspnea scale       Expected Outcomes Short Term: Able to use Dyspnea scale daily in rehab to express subjective sense of shortness of breath during exertion;Long Term: Able to use Dyspnea scale to guide intensity level when exercising independently       Knowledge and understanding of Target Heart Rate Range (THRR) Yes       Intervention Provide education and explanation of THRR including how the numbers were predicted and where they are located for reference       Expected Outcomes Short Term: Able to state/look up THRR;Long Term: Able to use THRR to govern intensity when exercising independently;Short Term: Able to use daily as guideline for intensity in rehab       Able to check pulse independently Yes       Intervention Review the importance of being able to check your own pulse for safety during independent exercise;Provide education and demonstration on how to check pulse in carotid and radial arteries.       Expected Outcomes Short Term: Able to explain why pulse checking is important during independent exercise;Long Term: Able to check pulse independently and accurately       Understanding of Exercise Prescription Yes       Intervention Provide  education, explanation, and written materials on patient's individual exercise prescription       Expected Outcomes Short Term: Able to explain program exercise prescription;Long Term: Able to explain home exercise prescription to exercise independently          Exercise Goals Re-Evaluation :    Discharge Exercise Prescription (Final Exercise Prescription Changes):  Exercise Prescription Changes - 07/20/24 1500       Response to Exercise   Blood Pressure (Admit) 102/44    Blood Pressure (Exercise) 130/50    Blood Pressure (Exit) 102/50    Heart Rate (Admit) 95 bpm    Heart Rate (Exercise) 100 bpm    Heart Rate (Exit) 81 bpm    Rating of Perceived Exertion (Exercise) 13    Duration Continue with 30 min of aerobic exercise without signs/symptoms of physical distress.    Intensity THRR unchanged      Progression   Progression Continue to progress workloads to maintain intensity without signs/symptoms of physical distress.      Resistance Training   Training Prescription Yes    Weight 3    Reps 10-15      NuStep   Level 2    SPM 103    Minutes 15    METs 2.2      Arm Ergometer   Level 1    RPM 57    Minutes 15    METs 1.9          Nutrition:  Target Goals: Understanding of nutrition guidelines, daily intake of sodium 1500mg , cholesterol 200mg , calories 30% from fat and 7% or less from saturated fats, daily to have 5 or more servings of fruits and vegetables.  Biometrics:  Pre Biometrics - 06/28/24 1522       Pre Biometrics   Height 5' 7 (1.702 m)    Weight 213 lb 3 oz (96.7 kg)    Waist Circumference 41 inches    Hip Circumference 47 inches    Waist to Hip Ratio 0.87 %    BMI (Calculated) 33.38    Grip Strength 25.3 kg    Single Leg Stand 4.23 seconds  Nutrition Therapy Plan and Nutrition Goals:  Nutrition Therapy & Goals - 06/28/24 1504       Intervention Plan   Intervention Prescribe, educate and counsel regarding individualized  specific dietary modifications aiming towards targeted core components such as weight, hypertension, lipid management, diabetes, heart failure and other comorbidities.;Nutrition handout(s) given to patient.    Expected Outcomes Short Term Goal: Understand basic principles of dietary content, such as calories, fat, sodium, cholesterol and nutrients.;Short Term Goal: A plan has been developed with personal nutrition goals set during dietitian appointment.;Long Term Goal: Adherence to prescribed nutrition plan.          Nutrition Assessments:  Nutrition Assessments - 06/28/24 1505       Rate Your Plate Scores   Pre Score 59         MEDIFICTS Score Key: >=70 Need to make dietary changes  40-70 Heart Healthy Diet <= 40 Therapeutic Level Cholesterol Diet  Flowsheet Row CARDIAC REHAB PHASE II ORIENTATION from 06/28/2024 in [facility] CARDIAC REHABILITATION  Picture Your Plate Total Score on Admission 59   Picture Your Plate Scores: <59 Unhealthy dietary pattern with much room for improvement. 41-50 Dietary pattern unlikely to meet recommendations for good health and room for improvement. 51-60 More healthful dietary pattern, with some room for improvement.  >60 Healthy dietary pattern, although there may be some specific behaviors that could be improved.    Nutrition Goals Re-Evaluation:   Nutrition Goals Discharge (Final Nutrition Goals Re-Evaluation):   Psychosocial: Target Goals: Acknowledge presence or absence of significant depression and/or stress, maximize coping skills, provide positive support system. Participant is able to verbalize types and ability to use techniques and skills needed for reducing stress and depression.  Initial Review & Psychosocial Screening:  Initial Psych Review & Screening - 06/28/24 1501       Initial Review   Current issues with History of Depression;Current Psychotropic Meds      Family Dynamics   Good Support System? Yes    Comments The  pt's husband and her sisters are her main support system.      Barriers   Psychosocial barriers to participate in program There are no identifiable barriers or psychosocial needs.      Screening Interventions   Interventions Encouraged to exercise    Expected Outcomes Long Term Goal: Stressors or current issues are controlled or eliminated.;Short Term goal: Identification and review with participant of any Quality of Life or Depression concerns found by scoring the questionnaire.;Long Term goal: The participant improves quality of Life and PHQ9 Scores as seen by post scores and/or verbalization of changes;Short Term goal: Utilizing psychosocial counselor, staff and physician to assist with identification of specific Stressors or current issues interfering with healing process. Setting desired goal for each stressor or current issue identified.          Quality of Life Scores:  Quality of Life - 06/28/24 1505       Quality of Life   Select Quality of Life      Quality of Life Scores   Health/Function Pre 25.03 %    Socioeconomic Pre 26.63 %    Psych/Spiritual Pre 29.14 %    Family Pre 27.6 %    GLOBAL Pre 25.59 %         Scores of 19 and below usually indicate a poorer quality of life in these areas.  A difference of  2-3 points is a clinically meaningful difference.  A difference of 2-3 points in the  total score of the Quality of Life Index has been associated with significant improvement in overall quality of life, self-image, physical symptoms, and general health in studies assessing change in quality of life.  PHQ-9: Review Flowsheet  More data exists      07/31/2024 06/28/2024 05/19/2023 10/10/2021 07/30/2021  Depression screen PHQ 2/9  Decreased Interest 0 0 0 0 0  Down, Depressed, Hopeless 0 0 0 1 1  PHQ - 2 Score 0 0 0 1 1  Altered sleeping - 2 3 - -  Tired, decreased energy - 2 2 - -  Change in appetite - 0 0 - -  Feeling bad or failure about yourself  - 0 0 - -   Trouble concentrating - 0 0 - -  Moving slowly or fidgety/restless - 0 0 - -  Suicidal thoughts - 0 0 - -  PHQ-9 Score - 4 5  - -  Difficult doing work/chores - Not difficult at all Not difficult at all - -    Details       Data saved with a previous flowsheet row definition        Interpretation of Total Score  Total Score Depression Severity:  1-4 = Minimal depression, 5-9 = Mild depression, 10-14 = Moderate depression, 15-19 = Moderately severe depression, 20-27 = Severe depression   Psychosocial Evaluation and Intervention:  Psychosocial Evaluation - 06/28/24 1501       Psychosocial Evaluation & Interventions   Interventions Encouraged to exercise with the program and follow exercise prescription    Comments Tionna is a pleasant 68 year old female who is coming into rehab for NSTEMI and CABGX1. She did rehab last year but only completed 7 sessions due to finacial issues. She is more excited this time because she doesn't have a co-pay so she can not be stressed about that. She doesn't do much exercise at home now besides some walking. She has a good support system with her husband and sisters.    Expected Outcomes Short: Increase strength. Long: Help chest not to be so sore.    Continue Psychosocial Services  Follow up required by staff          Psychosocial Re-Evaluation:   Psychosocial Discharge (Final Psychosocial Re-Evaluation):   Vocational Rehabilitation: Provide vocational rehab assistance to qualifying candidates.   Vocational Rehab Evaluation & Intervention:   Education: Education Goals: Education classes will be provided on a weekly basis, covering required topics. Participant will state understanding/return demonstration of topics presented.  Learning Barriers/Preferences:   Education Topics: Hypertension, Hypertension Reduction -Define heart disease and high blood pressure. Discus how high blood pressure affects the body and ways to reduce high  blood pressure.   Exercise and Your Heart -Discuss why it is important to exercise, the FITT principles of exercise, normal and abnormal responses to exercise, and how to exercise safely.   Angina -Discuss definition of angina, causes of angina, treatment of angina, and how to decrease risk of having angina.   Cardiac Medications -Review what the following cardiac medications are used for, how they affect the body, and side effects that may occur when taking the medications.  Medications include Aspirin , Beta blockers, calcium  channel blockers, ACE Inhibitors, angiotensin receptor blockers, diuretics, digoxin, and antihyperlipidemics.   Congestive Heart Failure -Discuss the definition of CHF, how to live with CHF, the signs and symptoms of CHF, and how keep track of weight and sodium intake.   Heart Disease and Intimacy -Discus the effect sexual activity  has on the heart, how changes occur during intimacy as we age, and safety during sexual activity.   Smoking Cessation / COPD -Discuss different methods to quit smoking, the health benefits of quitting smoking, and the definition of COPD.   Nutrition I: Fats -Discuss the types of cholesterol, what cholesterol does to the heart, and how cholesterol levels can be controlled. Flowsheet Row CARDIAC REHAB PHASE II EXERCISE from 06/09/2023 in [place] CARDIAC REH[facility]ILITATION  Date 06/02/23  Educator hb  Instruction Review Code 1- Verbalizes Understanding    Nutrition II: Labels -Discuss the different components of food labels and how to read food label   Heart Parts/Heart Disease and PAD -Discuss the anatomy of the heart, the pathway of blood circulation through the heart, and these are affected by heart disease.   Stress I: Signs and Symptoms -Discuss the causes of stress, how stress may lead to anxiety and depression, and ways to limit stress. Flowsheet Row CARDIAC REHAB PHASE II EXERCISE from 06/09/2023 in [place] CARDIAC  REHABILITATION  Date 06/09/23  Educator HB  Instruction Review Code 1- Verbalizes Understanding    Stress II: Relaxation -Discuss different types of relaxation techniques to limit stress.   Warning Signs of Stroke / TIA -Discuss definition of a stroke, what the signs and symptoms are of a stroke, and how to identify when someone is having stroke.   Knowledge Questionnaire Score:  Knowledge Questionnaire Score - 06/28/24 1505       Knowledge Questionnaire Score   Pre Score 23/26          Core Components/Risk Factors/Patient Goals at Admission:  Personal Goals and Risk Factors at Admission - 06/28/24 1504       Core Components/Risk Factors/Patient Goals on Admission    Weight Management Yes;Weight Maintenance    Intervention Weight Management/Obesity: Establish reasonable short term and long term weight goals.;Obesity: Provide education and appropriate resources to help participant work on and attain dietary goals.    Expected Outcomes Short Term: Continue to assess and modify interventions until short term weight is achieved;Long Term: Adherence to nutrition and physical activity/exercise program aimed toward attainment of established weight goal;Weight Loss: Understanding of general recommendations for a balanced deficit meal plan, which promotes 1-2 lb weight loss per week and includes a negative energy balance of [phone] kcal/d;Understanding recommendations for meals to include 15-35% energy as protein, 25-35% energy from fat, 35-60% energy from carbohydrates, less than 200mg  of dietary cholesterol, 20-35 gm of total fiber daily    Improve shortness of breath with ADL's Yes    Intervention Provide education, individualized exercise plan and daily activity instruction to help decrease symptoms of SOB with activities of daily living.    Expected Outcomes Short Term: Improve cardiorespiratory fitness to achieve a reduction of symptoms when performing ADLs;Long Term: Be able to  perform more ADLs without symptoms or delay the onset of symptoms    Diabetes Yes    Intervention Provide education about signs/symptoms and action to take for hypo/hyperglycemia.;Provide education about proper nutrition, including hydration, and aerobic/resistive exercise prescription along with prescribed medications to achieve blood glucose in normal ranges: Fasting glucose 65-99 mg/dL    Expected Outcomes Short Term: Participant verbalizes understanding of the signs/symptoms and immediate care of hyper/hypoglycemia, proper foot care and importance of medication, aerobic/resistive exercise and nutrition plan for blood glucose control.;Long Term: Attainment of HbA1C < 7%.    Hypertension Yes    Intervention Provide education on lifestyle modifcations including regular physical activity/exercise, weight management,  moderate sodium restriction and increased consumption of fresh fruit, vegetables, and low fat dairy, alcohol  moderation, and smoking cessation.;Monitor prescription use compliance.    Expected Outcomes Short Term: Continued assessment and intervention until BP is < 140/60mm HG in hypertensive participants. < 130/72mm HG in hypertensive participants with diabetes, heart failure or chronic kidney disease.;Long Term: Maintenance of blood pressure at goal levels.    Lipids Yes    Intervention Provide education and support for participant on nutrition & aerobic/resistive exercise along with prescribed medications to achieve LDL 70mg , HDL >40mg .    Expected Outcomes Short Term: Participant states understanding of desired cholesterol values and is compliant with medications prescribed. Participant is following exercise prescription and nutrition guidelines.;Long Term: Cholesterol controlled with medications as prescribed, with individualized exercise RX and with personalized nutrition plan. Value goals: LDL < 70mg , HDL > 40 mg.          Core Components/Risk Factors/Patient Goals Review:     Core Components/Risk Factors/Patient Goals at Discharge (Final Review):    ITP Comments:  ITP Comments     Row Name 06/28/24 1506 07/11/24 1338 07/18/24 1109 08/15/24 1630     ITP Comments Patient attend orientation today.  Patient is attending Cardiac Rehabilitation Program.  Documentation for diagnosis can be found in [facility].  Reviewed medical chart, RPE/RPD, gym safety, and program guidelines.  Patient was fitted to equipment they will be using during rehab.  Patient is scheduled to start exercise on 07/06/24.   Initial ITP created and sent for review and signature by Dr. Dorn Ross, Medical Director for Cardiac Rehabilitation Program. First full day of exercise!  Patient was oriented to gym and equipment including functions, settings, policies, and procedures.  Patient's individual exercise prescription and treatment plan were reviewed.  All starting workloads were established based on the results of the 6 minute walk test done at initial orientation visit.  The plan for exercise progression was also introduced and progression will be customized based on patient's performance and goals. 30 day review completed. ITP sent to Dr. Dorn Ross, Medical Director of Cardiac Rehab. Continue with ITP unless changes are made by physician.    New to program 30 day review completed. ITP sent to Dr. Dorn Ross, Medical Director of Cardiac Rehab. Continue with ITP unless changes are made by physician.   Currently out on medical hold until mid January,       Comments: 30 day review     [1]  Current Outpatient Medications:    Accu-Chek Softclix Lancets lancets, Use as instructed to test blood glucose four times daily, Disp: 100 each, Rfl: 2   aspirin  81 MG chewable tablet, Chew 1 tablet (81 mg total) by mouth daily., Disp: 30 tablet, Rfl: 0   busPIRone  (BUSPAR ) 5 MG tablet, Take 5 mg by mouth at bedtime., Disp: , Rfl:    Cholecalciferol  (VITAMIN D3) 125 MCG (5000 UT) CAPS, Take 1 capsule  (5,000 Units total) by mouth daily., Disp: 90 capsule, Rfl: 0   citalopram  (CELEXA ) 20 MG tablet, Take 1 tablet (20 mg total) by mouth every evening., Disp: 30 tablet, Rfl: 4   clopidogrel  (PLAVIX ) 75 MG tablet, Take 1 tablet (75 mg total) by mouth daily with breakfast., Disp: 30 tablet, Rfl: 0   Continuous Glucose Receiver (FREESTYLE LIBRE 2 READER) DEVI, USE TO CHECK GLUCOSE AS DIRECTED, Disp: 1 each, Rfl: 0   Continuous Glucose Sensor (FREESTYLE LIBRE 2 SENSOR) MISC, CHANGE SENSOR EVERY 14 DAYS USE  TO CHECK BLOOD GLUCOSE  CONTINUOUSLY, Disp: 6 each, Rfl: 3   donepezil  (ARICEPT ) 5 MG tablet, Take 5 mg by mouth every evening., Disp: , Rfl:    empagliflozin  (JARDIANCE ) 25 MG TABS tablet, Take 1 tablet (25 mg total) by mouth daily., Disp: 90 tablet, Rfl: 1   estradiol  (ESTRACE ) 1 MG tablet, Take 1 mg by mouth at bedtime., Disp: , Rfl:    fenofibrate  160 MG tablet, Take 1 tablet (160 mg total) by mouth daily., Disp: 90 tablet, Rfl: 1   gabapentin  (NEURONTIN ) 100 MG capsule, Take 1 capsule (100 mg total) by mouth at bedtime., Disp: , Rfl:    glucose blood test strip, 1 each by Other route as needed. Use as instructed to test blood glucose four times daily, Disp: 100 each, Rfl: 2   insulin  degludec (TRESIBA  FLEXTOUCH) 200 UNIT/ML FlexTouch Pen, Inject 100 Units into the skin at bedtime., Disp: 24 mL, Rfl: 2   insulin  lispro (HUMALOG  KWIKPEN) 100 UNIT/ML KwikPen, Inject 10-16 Units into the skin 3 (three) times daily before meals., Disp: 30 mL, Rfl: 1   Insulin  Pen Needle (B-D ULTRAFINE III SHORT PEN) 31G X 8 MM MISC, 1 each by Does not apply route as directed., Disp: 100 each, Rfl: 2   isosorbide  mononitrate (IMDUR ) 30 MG 24 hr tablet, Take 3 tablets (90 mg total) by mouth daily., Disp: 90 tablet, Rfl: 0   levocetirizine (XYZAL ) 5 MG tablet, Take 5 mg by mouth every evening., Disp: , Rfl:    levothyroxine  (SYNTHROID ) 50 MCG tablet, TAKE 1 TABLET BY MOUTH DAILY  BEFORE BREAKFAST, Disp: 100 tablet, Rfl:  2   Magnesium  Oxide (MAGNESIUM  OXIDE 400) 240 MG PACK, Take 400 mg by mouth daily., Disp: , Rfl:    metFORMIN  (GLUCOPHAGE ) 500 MG tablet, TAKE 1 TABLET BY MOUTH DAILY  WITH BREAKFAST, Disp: 90 tablet, Rfl: 3   modafinil  (PROVIGIL ) 100 MG tablet, Take 1 tablet (100 mg total) by mouth daily., Disp: 90 tablet, Rfl: 1   montelukast  (SINGULAIR ) 10 MG tablet, Take 10 mg by mouth at bedtime., Disp: , Rfl:    OVER THE COUNTER MEDICATION, Take 2 capsules by mouth at bedtime. Omega XL, Disp: , Rfl:    pantoprazole  (PROTONIX ) 40 MG tablet, TAKE 2 TABLETS EVERY DAY, Disp: 180 tablet, Rfl: 1   rOPINIRole  (REQUIP ) 2 MG tablet, Take 2 mg by mouth at bedtime., Disp: , Rfl:    rosuvastatin  (CRESTOR ) 40 MG tablet, Take 1 tablet (40 mg total) by mouth daily., Disp: 30 tablet, Rfl: 5   [START ON 08/17/2024] Semaglutide ,0.25 or 0.5MG /DOS, (OZEMPIC , 0.25 OR 0.5 MG/DOSE,) 2 MG/1.5ML SOPN, Inject 0.5 mg into the skin every Thursday., Disp: 6 mL, Rfl: 0   Vibegron  (GEMTESA ) 75 MG TABS, Take 1 tablet (75 mg total) by mouth daily., Disp: 30 tablet, Rfl: 11   warfarin (COUMADIN ) 5 MG tablet, TAKE ONE-HALF TABLET TO 1 TABLET BY MOUTH  DAILY OR AS DIRECTED, Disp: 100 tablet, Rfl: 1 [2]  Social History Tobacco Use  Smoking Status Former   Current packs/day: 0.00   Average packs/day: 2.0 packs/day   Types: Cigarettes   Quit date: 08/17/1994   Years since quitting: 30.0  Smokeless Tobacco Never

## 2024-08-16 ENCOUNTER — Telehealth: Payer: Self-pay | Admitting: Nurse Practitioner

## 2024-08-16 NOTE — Telephone Encounter (Signed)
" °*  STAT* If patient is at the pharmacy, call can be transferred to refill team.   1. Which medications need to be refilled? (please list name of each medication and dose if known)   clopidogrel  (PLAVIX ) 75 MG tablet     2. Would you like to learn more about the convenience, safety, & potential cost savings by using the [facility]? No    3. Are you open to using the [facility] (Type [facility]. No    4. Which pharmacy/location (including street and city if local pharmacy) is medication to be sent to?[facility] - [place], [place] - [address]    5. Do they need a 30 day or 90 day supply? 90 day   "

## 2024-08-21 ENCOUNTER — Telehealth: Payer: Self-pay | Admitting: Cardiology

## 2024-08-21 ENCOUNTER — Ambulatory Visit: Attending: Cardiology | Admitting: *Deleted

## 2024-08-21 ENCOUNTER — Other Ambulatory Visit: Payer: Self-pay | Admitting: Cardiology

## 2024-08-21 DIAGNOSIS — I4821 Permanent atrial fibrillation: Secondary | ICD-10-CM | POA: Diagnosis not present

## 2024-08-21 DIAGNOSIS — Z5181 Encounter for therapeutic drug level monitoring: Secondary | ICD-10-CM | POA: Diagnosis not present

## 2024-08-21 LAB — POCT INR: INR: 1.7 — AB (ref 2.0–3.0)

## 2024-08-21 MED ORDER — CLOPIDOGREL BISULFATE 75 MG PO TABS: 75.0000 mg | ORAL_TABLET | Freq: Every day | ORAL | 3 refills | Status: DC

## 2024-08-21 NOTE — Progress Notes (Signed)
 Rachel Odonnell                                          MRN: [id number]   08/21/2024   The [facility] Quality Team Specialist reviewed this patient medical record for the purposes of chart review for care gap closure. The following were reviewed: chart review for care gap closure-glycemic status assessment.    [facility] Quality Team

## 2024-08-21 NOTE — Telephone Encounter (Signed)
 Pt's medication was sent to pt's pharmacy as requested. Confirmation received.

## 2024-08-21 NOTE — Telephone Encounter (Signed)
 1. Which medications need to be refilled? (please list name of each medication and dose if known) Clopidogrel   Isosorbide    2. Which pharmacy/location (including street and city if local pharmacy) is medication to be sent to?[facility]  3. Do they need a 30 day or 90 day supply? 90  PT IS OUT OF BOTH MEDICATIONS

## 2024-08-21 NOTE — Progress Notes (Signed)
 INR 1.7; Please see anticoagulation encounter

## 2024-08-21 NOTE — Patient Instructions (Signed)
 Increase warfarin 1/2 tablet daily except 1 tablet on Mondays and Fridays Recheck INR in 2 weeks

## 2024-08-22 ENCOUNTER — Other Ambulatory Visit: Payer: Self-pay | Admitting: Cardiology

## 2024-08-22 ENCOUNTER — Encounter (HOSPITAL_COMMUNITY)

## 2024-08-23 ENCOUNTER — Other Ambulatory Visit: Payer: Self-pay | Admitting: Cardiology

## 2024-08-23 DIAGNOSIS — I4891 Unspecified atrial fibrillation: Secondary | ICD-10-CM

## 2024-08-23 MED ORDER — CLOPIDOGREL BISULFATE 75 MG PO TABS: 75.0000 mg | ORAL_TABLET | Freq: Every day | ORAL | 3 refills | Status: AC

## 2024-08-23 MED ORDER — ISOSORBIDE MONONITRATE ER 30 MG PO TB24: 90.0000 mg | ORAL_TABLET | Freq: Every day | ORAL | 3 refills | Status: AC

## 2024-08-23 NOTE — Telephone Encounter (Signed)
 Warfarin 5mg  Dx-Afib Last INR Check-08/21/24 Last OV- [name]

## 2024-08-24 ENCOUNTER — Encounter (HOSPITAL_COMMUNITY)

## 2024-08-24 ENCOUNTER — Other Ambulatory Visit: Payer: Self-pay

## 2024-08-24 DIAGNOSIS — E1159 Type 2 diabetes mellitus with other circulatory complications: Secondary | ICD-10-CM

## 2024-08-24 MED ORDER — FREESTYLE LIBRE 2 READER DEVI: 0 refills | Status: AC

## 2024-08-25 NOTE — Telephone Encounter (Signed)
 Refills were sent to the pharmacy on 08-23-24.

## 2024-08-29 ENCOUNTER — Encounter (HOSPITAL_COMMUNITY)

## 2024-08-31 ENCOUNTER — Encounter (HOSPITAL_COMMUNITY)

## 2024-09-01 ENCOUNTER — Other Ambulatory Visit (HOSPITAL_COMMUNITY): Payer: Self-pay

## 2024-09-01 ENCOUNTER — Telehealth: Payer: Self-pay | Admitting: Pharmacy Technician

## 2024-09-01 NOTE — Telephone Encounter (Signed)
 Pharmacy Patient Advocate Encounter   Received notification from [facility] KEY that prior authorization for FreeStyle [place] 2 Reader device  is required/requested.   Insurance verification completed.   The patient is insured through [employer].   Per test claim: PA required; PA submitted to above mentioned insurance via Latent Key/confirmation #/EOC [id number] Status is pending

## 2024-09-04 ENCOUNTER — Ambulatory Visit: Attending: Cardiology | Admitting: *Deleted

## 2024-09-04 ENCOUNTER — Other Ambulatory Visit (HOSPITAL_COMMUNITY): Payer: Self-pay

## 2024-09-04 DIAGNOSIS — I4821 Permanent atrial fibrillation: Secondary | ICD-10-CM

## 2024-09-04 DIAGNOSIS — Z5181 Encounter for therapeutic drug level monitoring: Secondary | ICD-10-CM | POA: Diagnosis not present

## 2024-09-04 LAB — POCT INR: INR: 2.1 (ref 2.0–3.0)

## 2024-09-04 NOTE — Progress Notes (Signed)
 INR 2.1; Please see anticoagulation encounter

## 2024-09-04 NOTE — Telephone Encounter (Signed)
 Pharmacy Patient Advocate Encounter  Received notification from [employer] that Prior Authorization for FreeStyle [place] 2 Reader device  has been APPROVED from 08/17/24 to 08/16/25. Ran test claim, Copay is $0.00. This test claim was processed through [facility]- copay amounts may vary at other pharmacies due to pharmacy/plan contracts, or as the patient moves through the different stages of their insurance plan.   PA #/Case ID/Reference #: [id number]

## 2024-09-04 NOTE — Patient Instructions (Signed)
 Continue warfarin 1/2 tablet daily except 1 tablet on Mondays and Fridays Recheck INR in 2 weeks

## 2024-09-05 ENCOUNTER — Encounter (HOSPITAL_COMMUNITY)

## 2024-09-07 ENCOUNTER — Encounter (HOSPITAL_COMMUNITY): Attending: Cardiology

## 2024-09-12 ENCOUNTER — Encounter (HOSPITAL_COMMUNITY): Payer: Self-pay

## 2024-09-12 ENCOUNTER — Telehealth (HOSPITAL_COMMUNITY): Payer: Self-pay

## 2024-09-12 ENCOUNTER — Encounter (HOSPITAL_COMMUNITY)

## 2024-09-12 DIAGNOSIS — Z951 Presence of aortocoronary bypass graft: Secondary | ICD-10-CM

## 2024-09-12 DIAGNOSIS — I214 Non-ST elevation (NSTEMI) myocardial infarction: Secondary | ICD-10-CM

## 2024-09-12 DIAGNOSIS — Z955 Presence of coronary angioplasty implant and graft: Secondary | ICD-10-CM

## 2024-09-12 NOTE — Telephone Encounter (Signed)
 Pt called to cancel  and discharge from program

## 2024-09-12 NOTE — Telephone Encounter (Signed)
 Patient called to cancel appointment for today due to weather. Then she said that has a lot if doctor appointment coming up and has therapy for her back so she would like to be discharged. She is going to [facility].This is her second time dropping rehab

## 2024-09-12 NOTE — Progress Notes (Signed)
 Cardiac Individual Treatment Plan  Patient Details  Name: Rachel Odonnell MRN: [id number] Date of Birth: 1955-11-13 Referring Provider:   Flowsheet Row CARDIAC REHAB PHASE II ORIENTATION from 06/28/2024 in [facility] CARDIAC REHABILITATION  Referring Provider Alvan Carrier MD    Initial Encounter Date:  Flowsheet Row CARDIAC REHAB PHASE II ORIENTATION from 06/28/2024 in [place] [facility]  Date 06/28/24    Visit Diagnosis: NSTEMI (non-ST elevated myocardial infarction) (HCC)  S/P CABG x 1  Patient's Home Medications on Admission: Current Medications[1]  Past Medical History: Past Medical History:  Diagnosis Date   ADD (attention deficit disorder)    Allergy ?   Ive had seasonal allergies for years.   Anxiety 2006   Thats when I was first put on Celexa .   Arthritis Not sure   CAD (coronary artery disease)    Diabetes mellitus without complication (HCC)    I dont remember.   Emphysema of lung (HCC) 2022   Mild case   GERD (gastroesophageal reflux disease) Not sure   Heart murmur Not sure   Hypertension    PAF (paroxysmal atrial fibrillation) (HCC)    Sleep apnea 2022   Thyroid  disease 2000    Tobacco Use: Tobacco Use History[2]  Labs: Review Flowsheet  More data exists      Latest Ref Rng & Units 05/05/2024 05/06/2024 05/07/2024 07/19/2024 08/14/2024  Labs for ITP Cardiac and Pulmonary Rehab  Cholestrol 0 - 200 - - - 139     -  LDL (calc) - - - - 70     -  HDL-C 35 - 70 - - - 38     -  Trlycerides 40 - 160 - - - 184     -  Hemoglobin A1c 4.0 - 5.6 % 9.2  - - - 8.2   PH, Arterial 7.35 - 7.45 - 7.329  7.281  7.240  7.295  7.255  7.387  7.347  - -  PCO2 arterial 32 - 48 mmHg - 49.0  51.2  54.2  45.8  51.9  38.9  47.3  - -  Bicarbonate 20.0 - 28.0 mmol/L - 25.7  23.7  22.8  22.2  23.0  23.6  25.9  - -  TCO2 22 - 32 mmol/L - 27  25  24  24  25  25  25  26  27  27   - -  Acid-base deficit 0.0 - 2.0 mmol/L - 1.0  3.0  5.0  4.0  4.0  1.0  - - -  O2  Saturation % - 98  97  92  92  90  97  97  - -    Details       This result is from an external source.   Multiple values from one day are sorted in reverse-chronological order          Exercise Target Goals: Exercise Program Goal: Individual exercise prescription set using results from initial 6 min walk test and THRR while considering  patients activity barriers and safety.   Exercise Prescription Goal: Initial exercise prescription builds to 30-45 minutes a day of aerobic activity, 2-3 days per week.  Home exercise guidelines will be given to patient during program as part of exercise prescription that the participant will acknowledge.   Education: Aerobic Exercise: - Group verbal and visual presentation on the components of exercise prescription. Introduces F.I.T.T principle from ACSM for exercise prescriptions.  Reviews F.I.T.T. principles of aerobic exercise including progression. Written  material provided at class time.   Education: Resistance Exercise: - Group verbal and visual presentation on the components of exercise prescription. Introduces F.I.T.T principle from ACSM for exercise prescriptions  Reviews F.I.T.T. principles of resistance exercise including progression. Written material provided at class time.    Education: Exercise & Equipment Safety: - Individual verbal instruction and demonstration of equipment use and safety with use of the equipment.   Education: Exercise Physiology & General Exercise Guidelines: - Group verbal and written instruction with models to review the exercise physiology of the cardiovascular system and associated critical values. Provides general exercise guidelines with specific guidelines to those with heart or lung disease. Written material provided at class time. Flowsheet Row CARDIAC REHAB PHASE II EXERCISE from 07/20/2024 in [facility]  Education need identified 06/26/24    Education: Flexibility, Balance,  Mind/Body Relaxation: - Group verbal and visual presentation with interactive activity on the components of exercise prescription. Introduces F.I.T.T principle from ACSM for exercise prescriptions. Reviews F.I.T.T. principles of flexibility and balance exercise training including progression. Also discusses the mind body connection.  Reviews various relaxation techniques to help reduce and manage stress (i.e. Deep breathing, progressive muscle relaxation, and visualization). Balance handout provided to take home. Written material provided at class time. Flowsheet Row CARDIAC REHAB PHASE II EXERCISE from 07/20/2024 in [place] [facility]  Education need identified 06/26/24    Activity Barriers & Risk Stratification:  Activity Barriers & Cardiac Risk Stratification - 06/28/24 1419       Activity Barriers & Cardiac Risk Stratification   Activity Barriers Arthritis;Back Problems;Deconditioning;Muscular Weakness;Incisional Pain;Chest Pain/Angina    Cardiac Risk Stratification High          6 Minute Walk:  6 Minute Walk     Row Name 06/28/24 1519         6 Minute Walk   Phase Initial     Distance 800 feet     Walk Time 6 minutes     # of Rest Breaks 1     MPH 1.52     METS 1.42     RPE 12     VO2 Peak 4.97     Symptoms Yes (comment)     Comments BL hip pain 4/10     Resting HR 79 bpm     Resting BP 122/70     Resting Oxygen Saturation  97 %     Exercise Oxygen Saturation  during 6 min walk 97 %     Max Ex. HR 85 bpm     Max Ex. BP 134/60     2 Minute Post BP 120/68        Oxygen Initial Assessment:   Oxygen Re-Evaluation:   Oxygen Discharge (Final Oxygen Re-Evaluation):   Initial Exercise Prescription:  Initial Exercise Prescription - 06/28/24 1500       Date of Initial Exercise RX and Referring Provider   Date 06/28/24    Referring Provider Alvan Carrier MD      Oxygen   Maintain Oxygen Saturation 88% or higher      NuStep   Level 1     SPM 50    Minutes 15    METs 1.9      Arm Ergometer   Level 1    RPM 30    Minutes 15    METs 1.9      Prescription Details   Frequency (times per week) 2    Duration Progress to 30 minutes of  continuous aerobic without signs/symptoms of physical distress      Intensity   THRR 40-80% of Max Heartrate 108-137    Ratings of Perceived Exertion 11-13    Perceived Dyspnea 0-4      Resistance Training   Training Prescription Yes    Weight 3    Reps 10-15          Perform Capillary Blood Glucose checks as needed.  Exercise Prescription Changes:   Exercise Prescription Changes     Row Name 07/20/24 1500             Response to Exercise   Blood Pressure (Admit) 102/44       Blood Pressure (Exercise) 130/50       Blood Pressure (Exit) 102/50       Heart Rate (Admit) 95 bpm       Heart Rate (Exercise) 100 bpm       Heart Rate (Exit) 81 bpm       Rating of Perceived Exertion (Exercise) 13       Duration Continue with 30 min of aerobic exercise without signs/symptoms of physical distress.       Intensity THRR unchanged         Progression   Progression Continue to progress workloads to maintain intensity without signs/symptoms of physical distress.         Resistance Training   Training Prescription Yes       Weight 3       Reps 10-15         NuStep   Level 2       SPM 103       Minutes 15       METs 2.2         Arm Ergometer   Level 1       RPM 57       Minutes 15       METs 1.9          Exercise Comments:   Exercise Comments     Row Name 06/28/24 1500 06/28/24 1506 07/11/24 1339       Exercise Comments Nathanel doesn't do much exercise at home besides a little walking. Patient attend orientation today.  Patient is attending Cardiac Rehabilitation Program.  Documentation for diagnosis can be found in [facility].  Reviewed medical chart, RPE/RPD, gym safety, and program guidelines.  Patient was fitted to equipment they will be using during rehab.  Patient is  scheduled to start exercise on 07/06/24.   Initial ITP created and sent for review and signature by Dr. Dorn Ross, Medical Director for Cardiac Rehabilitation Program. First full day of exercise!  Patient was oriented to gym and equipment including functions, settings, policies, and procedures.  Patient's individual exercise prescription and treatment plan were reviewed.  All starting workloads were established based on the results of the 6 minute walk test done at initial orientation visit.  The plan for exercise progression was also introduced and progression will be customized based on patient's performance and goals.        Exercise Goals and Review:   Exercise Goals     Row Name 06/28/24 1500             Exercise Goals   Increase Physical Activity Yes       Intervention Provide advice, education, support and counseling about physical activity/exercise needs.;Develop an individualized exercise prescription for aerobic and resistive training based on initial evaluation findings, risk stratification, comorbidities  and participant's personal goals.       Expected Outcomes Short Term: Attend rehab on a regular basis to increase amount of physical activity.;Long Term: Add in home exercise to make exercise part of routine and to increase amount of physical activity.;Long Term: Exercising regularly at least 3-5 days a week.       Increase Strength and Stamina Yes       Intervention Provide advice, education, support and counseling about physical activity/exercise needs.;Develop an individualized exercise prescription for aerobic and resistive training based on initial evaluation findings, risk stratification, comorbidities and participant's personal goals.       Expected Outcomes Short Term: Increase workloads from initial exercise prescription for resistance, speed, and METs.;Long Term: Improve cardiorespiratory fitness, muscular endurance and strength as measured by increased METs and  functional capacity ( );Short Term: Perform resistance training exercises routinely during rehab and add in resistance training at home       Able to understand and use rate of perceived exertion (RPE) scale Yes       Intervention Provide education and explanation on how to use RPE scale       Expected Outcomes Short Term: Able to use RPE daily in rehab to express subjective intensity level;Long Term:  Able to use RPE to guide intensity level when exercising independently       Able to understand and use Dyspnea scale Yes       Intervention Provide education and explanation on how to use Dyspnea scale       Expected Outcomes Short Term: Able to use Dyspnea scale daily in rehab to express subjective sense of shortness of breath during exertion;Long Term: Able to use Dyspnea scale to guide intensity level when exercising independently       Knowledge and understanding of Target Heart Rate Range (THRR) Yes       Intervention Provide education and explanation of THRR including how the numbers were predicted and where they are located for reference       Expected Outcomes Short Term: Able to state/look up THRR;Long Term: Able to use THRR to govern intensity when exercising independently;Short Term: Able to use daily as guideline for intensity in rehab       Able to check pulse independently Yes       Intervention Review the importance of being able to check your own pulse for safety during independent exercise;Provide education and demonstration on how to check pulse in carotid and radial arteries.       Expected Outcomes Short Term: Able to explain why pulse checking is important during independent exercise;Long Term: Able to check pulse independently and accurately       Understanding of Exercise Prescription Yes       Intervention Provide education, explanation, and written materials on patient's individual exercise prescription       Expected Outcomes Short Term: Able to explain program exercise  prescription;Long Term: Able to explain home exercise prescription to exercise independently          Exercise Goals Re-Evaluation :   Discharge Exercise Prescription (Final Exercise Prescription Changes):  Exercise Prescription Changes - 07/20/24 1500       Response to Exercise   Blood Pressure (Admit) 102/44    Blood Pressure (Exercise) 130/50    Blood Pressure (Exit) 102/50    Heart Rate (Admit) 95 bpm    Heart Rate (Exercise) 100 bpm    Heart Rate (Exit) 81 bpm    Rating of Perceived Exertion (Exercise)  13    Duration Continue with 30 min of aerobic exercise without signs/symptoms of physical distress.    Intensity THRR unchanged      Progression   Progression Continue to progress workloads to maintain intensity without signs/symptoms of physical distress.      Resistance Training   Training Prescription Yes    Weight 3    Reps 10-15      NuStep   Level 2    SPM 103    Minutes 15    METs 2.2      Arm Ergometer   Level 1    RPM 57    Minutes 15    METs 1.9          Nutrition:  Target Goals: Understanding of nutrition guidelines, daily intake of sodium 1500mg , cholesterol 200mg , calories 30% from fat and 7% or less from saturated fats, daily to have 5 or more servings of fruits and vegetables.  Education: Nutrition 1 -Group instruction provided by verbal, written material, interactive activities, discussions, models, and posters to present general guidelines for heart healthy nutrition including macronutrients, label reading, and promoting whole foods over processed counterparts. Education serves as pensions consultant of discussion of heart healthy eating for all. Written material provided at class time. Flowsheet Row CARDIAC REHAB PHASE II EXERCISE from 07/20/2024 in [facility]  Education need identified 06/26/24     Education: Nutrition 2 -Group instruction provided by verbal, written material, interactive activities, discussions, models,  and posters to present general guidelines for heart healthy nutrition including sodium, cholesterol, and saturated fat. Providing guidance of habit forming to improve blood pressure, cholesterol, and body weight. Written material provided at class time. Flowsheet Row CARDIAC REHAB PHASE II EXERCISE from 07/20/2024 in [place] [facility]  Education need identified 06/26/24      Biometrics:  Pre Biometrics - 06/28/24 1522       Pre Biometrics   Height 5' 7 (1.702 m)    Weight 213 lb 3 oz (96.7 kg)    Waist Circumference 41 inches    Hip Circumference 47 inches    Waist to Hip Ratio 0.87 %    BMI (Calculated) 33.38    Grip Strength 25.3 kg    Single Leg Stand 4.23 seconds           Nutrition Therapy Plan and Nutrition Goals:  Nutrition Therapy & Goals - 06/28/24 1504       Intervention Plan   Intervention Prescribe, educate and counsel regarding individualized specific dietary modifications aiming towards targeted core components such as weight, hypertension, lipid management, diabetes, heart failure and other comorbidities.;Nutrition handout(s) given to patient.    Expected Outcomes Short Term Goal: Understand basic principles of dietary content, such as calories, fat, sodium, cholesterol and nutrients.;Short Term Goal: A plan has been developed with personal nutrition goals set during dietitian appointment.;Long Term Goal: Adherence to prescribed nutrition plan.          Nutrition Assessments:  Nutrition Assessments - 06/28/24 1505       Rate Your Plate Scores   Pre Score 59         MEDIFICTS Score Key: >=70 Need to make dietary changes  40-70 Heart Healthy Diet <= 40 Therapeutic Level Cholesterol Diet  Flowsheet Row CARDIAC REHAB PHASE II ORIENTATION from 06/28/2024 in [facility] CARDIAC REH[facility]ILITATION  Picture Your Plate Total Score on Admission 59   Picture Your Plate Scores: <59 Unhealthy dietary pattern with much room for improvement. 41-50  Dietary pattern unlikely to meet recommendations for good health and room for improvement. 51-60 More healthful dietary pattern, with some room for improvement.  >60 Healthy dietary pattern, although there may be some specific behaviors that could be improved.    Nutrition Goals Re-Evaluation:   Nutrition Goals Discharge (Final Nutrition Goals Re-Evaluation):   Psychosocial: Target Goals: Acknowledge presence or absence of significant depression and/or stress, maximize coping skills, provide positive support system. Participant is able to verbalize types and ability to use techniques and skills needed for reducing stress and depression.   Education: Stress, Anxiety, and Depression - Group verbal and visual presentation to define topics covered.  Reviews how body is impacted by stress, anxiety, and depression.  Also discusses healthy ways to reduce stress and to treat/manage anxiety and depression. Written material provided at class time.   Education: Sleep Hygiene -Provides group verbal and written instruction about how sleep can affect your health.  Define sleep hygiene, discuss sleep cycles and impact of sleep habits. Review good sleep hygiene tips.   Initial Review & Psychosocial Screening:  Initial Psych Review & Screening - 06/28/24 1501       Initial Review   Current issues with History of Depression;Current Psychotropic Meds      Family Dynamics   Good Support System? Yes    Comments The pt's husband and her sisters are her main support system.      Barriers   Psychosocial barriers to participate in program There are no identifiable barriers or psychosocial needs.      Screening Interventions   Interventions Encouraged to exercise    Expected Outcomes Long Term Goal: Stressors or current issues are controlled or eliminated.;Short Term goal: Identification and review with participant of any Quality of Life or Depression concerns found by scoring the questionnaire.;Long  Term goal: The participant improves quality of Life and PHQ9 Scores as seen by post scores and/or verbalization of changes;Short Term goal: Utilizing psychosocial counselor, staff and physician to assist with identification of specific Stressors or current issues interfering with healing process. Setting desired goal for each stressor or current issue identified.          Quality of Life Scores:   Quality of Life - 06/28/24 1505       Quality of Life   Select Quality of Life      Quality of Life Scores   Health/Function Pre 25.03 %    Socioeconomic Pre 26.63 %    Psych/Spiritual Pre 29.14 %    Family Pre 27.6 %    GLOBAL Pre 25.59 %         Scores of 19 and below usually indicate a poorer quality of life in these areas.  A difference of  2-3 points is a clinically meaningful difference.  A difference of 2-3 points in the total score of the Quality of Life Index has been associated with significant improvement in overall quality of life, self-image, physical symptoms, and general health in studies assessing change in quality of life.  PHQ-9: Review Flowsheet  More data exists      07/31/2024 06/28/2024 05/19/2023 10/10/2021 07/30/2021  Depression screen PHQ 2/9  Decreased Interest 0 0 0 0 0  Down, Depressed, Hopeless 0 0 0 1 1  PHQ - 2 Score 0 0 0 1 1  Altered sleeping - 2 3 - -  Tired, decreased energy - 2 2 - -  Change in appetite - 0 0 - -  Feeling bad or failure about  yourself  - 0 0 - -  Trouble concentrating - 0 0 - -  Moving slowly or fidgety/restless - 0 0 - -  Suicidal thoughts - 0 0 - -  PHQ-9 Score - 4 5  - -  Difficult doing work/chores - Not difficult at all Not difficult at all - -    Details       Data saved with a previous flowsheet row definition        Interpretation of Total Score  Total Score Depression Severity:  1-4 = Minimal depression, 5-9 = Mild depression, 10-14 = Moderate depression, 15-19 = Moderately severe depression, 20-27 = Severe  depression   Psychosocial Evaluation and Intervention:  Psychosocial Evaluation - 06/28/24 1501       Psychosocial Evaluation & Interventions   Interventions Encouraged to exercise with the program and follow exercise prescription    Comments Elya is a pleasant 69 year old female who is coming into rehab for NSTEMI and CABGX1. She did rehab last year but only completed 7 sessions due to finacial issues. She is more excited this time because she doesn't have a co-pay so she can not be stressed about that. She doesn't do much exercise at home now besides some walking. She has a good support system with her husband and sisters.    Expected Outcomes Short: Increase strength. Long: Help chest not to be so sore.    Continue Psychosocial Services  Follow up required by staff          Psychosocial Re-Evaluation:   Psychosocial Discharge (Final Psychosocial Re-Evaluation):   Vocational Rehabilitation: Provide vocational rehab assistance to qualifying candidates.   Vocational Rehab Evaluation & Intervention:   Education: Education Goals: Education classes will be provided on a variety of topics geared toward better understanding of heart health and risk factor modification. Participant will state understanding/return demonstration of topics presented as noted by education test scores.  Learning Barriers/Preferences:   General Cardiac Education Topics:  AED/CPR: - Group verbal and written instruction with the use of models to demonstrate the basic use of the AED with the basic ABC's of resuscitation.   Test and Procedures: - Group verbal and visual presentation and models provide information about basic cardiac anatomy and function. Reviews the testing methods done to diagnose heart disease and the outcomes of the test results. Describes the treatment choices: Medical Management, Angioplasty, or Coronary Bypass Surgery for treating various heart conditions including Myocardial  Infarction, Angina, Valve Disease, and Cardiac Arrhythmias. Written material provided at class time.   Medication Safety: - Group verbal and visual instruction to review commonly prescribed medications for heart and lung disease. Reviews the medication, class of the drug, and side effects. Includes the steps to properly store meds and maintain the prescription regimen. Written material provided at class time. Flowsheet Row CARDIAC REHAB PHASE II EXERCISE from 07/20/2024 in [place] [facility]  Date 07/20/24  Educator HB  Instruction Review Code 1- Verbalizes Understanding    Intimacy: - Group verbal instruction through game format to discuss how heart and lung disease can affect sexual intimacy. Written material provided at class time.   Know Your Numbers and Heart Failure: - Group verbal and visual instruction to discuss disease risk factors for cardiac and pulmonary disease and treatment options.  Reviews associated critical values for Overweight/Obesity, Hypertension, Cholesterol, and Diabetes.  Discusses basics of heart failure: signs/symptoms and treatments.  Introduces Heart Failure Zone chart for action plan for heart failure. Written material provided  at class time.   Infection Prevention: - Provides verbal and written material to individual with discussion of infection control including proper hand washing and proper equipment cleaning during exercise session.   Falls Prevention: - Provides verbal and written material to individual with discussion of falls prevention and safety.   Other: -Provides group and verbal instruction on various topics (see comments)   Knowledge Questionnaire Score:  Knowledge Questionnaire Score - 06/28/24 1505       Knowledge Questionnaire Score   Pre Score 23/26          Core Components/Risk Factors/Patient Goals at Admission:  Personal Goals and Risk Factors at Admission - 06/28/24 1504       Core Components/Risk  Factors/Patient Goals on Admission    Weight Management Yes;Weight Maintenance    Intervention Weight Management/Obesity: Establish reasonable short term and long term weight goals.;Obesity: Provide education and appropriate resources to help participant work on and attain dietary goals.    Expected Outcomes Short Term: Continue to assess and modify interventions until short term weight is achieved;Long Term: Adherence to nutrition and physical activity/exercise program aimed toward attainment of established weight goal;Weight Loss: Understanding of general recommendations for a balanced deficit meal plan, which promotes 1-2 lb weight loss per week and includes a negative energy balance of [phone] kcal/d;Understanding recommendations for meals to include 15-35% energy as protein, 25-35% energy from fat, 35-60% energy from carbohydrates, less than 200mg  of dietary cholesterol, 20-35 gm of total fiber daily    Improve shortness of breath with ADL's Yes    Intervention Provide education, individualized exercise plan and daily activity instruction to help decrease symptoms of SOB with activities of daily living.    Expected Outcomes Short Term: Improve cardiorespiratory fitness to achieve a reduction of symptoms when performing ADLs;Long Term: Be able to perform more ADLs without symptoms or delay the onset of symptoms    Diabetes Yes    Intervention Provide education about signs/symptoms and action to take for hypo/hyperglycemia.;Provide education about proper nutrition, including hydration, and aerobic/resistive exercise prescription along with prescribed medications to achieve blood glucose in normal ranges: Fasting glucose 65-99 mg/dL    Expected Outcomes Short Term: Participant verbalizes understanding of the signs/symptoms and immediate care of hyper/hypoglycemia, proper foot care and importance of medication, aerobic/resistive exercise and nutrition plan for blood glucose control.;Long Term: Attainment  of HbA1C < 7%.    Hypertension Yes    Intervention Provide education on lifestyle modifcations including regular physical activity/exercise, weight management, moderate sodium restriction and increased consumption of fresh fruit, vegetables, and low fat dairy, alcohol  moderation, and smoking cessation.;Monitor prescription use compliance.    Expected Outcomes Short Term: Continued assessment and intervention until BP is < 140/102mm HG in hypertensive participants. < 130/2mm HG in hypertensive participants with diabetes, heart failure or chronic kidney disease.;Long Term: Maintenance of blood pressure at goal levels.    Lipids Yes    Intervention Provide education and support for participant on nutrition & aerobic/resistive exercise along with prescribed medications to achieve LDL 70mg , HDL >40mg .    Expected Outcomes Short Term: Participant states understanding of desired cholesterol values and is compliant with medications prescribed. Participant is following exercise prescription and nutrition guidelines.;Long Term: Cholesterol controlled with medications as prescribed, with individualized exercise RX and with personalized nutrition plan. Value goals: LDL < 70mg , HDL > 40 mg.          Education:Diabetes - Individual verbal and written instruction to review signs/symptoms of diabetes, desired ranges of glucose  level fasting, after meals and with exercise. Acknowledge that pre and post exercise glucose checks will be done for 3 sessions at entry of program.   Core Components/Risk Factors/Patient Goals Review:    Core Components/Risk Factors/Patient Goals at Discharge (Final Review):    ITP Comments:  ITP Comments     Row Name 06/28/24 1506 07/11/24 1338 07/18/24 1109 08/15/24 1630 09/12/24 1012   ITP Comments Patient attend orientation today.  Patient is attending Cardiac Rehabilitation Program.  Documentation for diagnosis can be found in [facility].  Reviewed medical chart, RPE/RPD, gym safety,  and program guidelines.  Patient was fitted to equipment they will be using during rehab.  Patient is scheduled to start exercise on 07/06/24.   Initial ITP created and sent for review and signature by Dr. Dorn Ross, Medical Director for Cardiac Rehabilitation Program. First full day of exercise!  Patient was oriented to gym and equipment including functions, settings, policies, and procedures.  Patient's individual exercise prescription and treatment plan were reviewed.  All starting workloads were established based on the results of the 6 minute walk test done at initial orientation visit.  The plan for exercise progression was also introduced and progression will be customized based on patient's performance and goals. 30 day review completed. ITP sent to Dr. Dorn Ross, Medical Director of Cardiac Rehab. Continue with ITP unless changes are made by physician.    New to program 30 day review completed. ITP sent to Dr. Dorn Ross, Medical Director of Cardiac Rehab. Continue with ITP unless changes are made by physician.   Currently out on medical hold until mid January, 30 day review completed. ITP sent to Dr. Dorn Ross, Medical Director of Cardiac Rehab. Continue with ITP unless changes are made by physician.   Goals not obtained due to attendance issues. Was supposed to start back on 09/05/24, yet has failed to start back.      Comments: 30 day review     [1]  Current Outpatient Medications:    Accu-Chek Softclix Lancets lancets, Use as instructed to test blood glucose four times daily, Disp: 100 each, Rfl: 2   aspirin  81 MG chewable tablet, Chew 1 tablet (81 mg total) by mouth daily., Disp: 30 tablet, Rfl: 0   busPIRone  (BUSPAR ) 5 MG tablet, Take 5 mg by mouth at bedtime., Disp: , Rfl:    Cholecalciferol  (VITAMIN D3) 125 MCG (5000 UT) CAPS, Take 1 capsule (5,000 Units total) by mouth daily., Disp: 90 capsule, Rfl: 0   citalopram  (CELEXA ) 20 MG tablet, Take 1 tablet (20 mg  total) by mouth every evening., Disp: 30 tablet, Rfl: 4   clopidogrel  (PLAVIX ) 75 MG tablet, Take 1 tablet (75 mg total) by mouth daily with breakfast., Disp: 90 tablet, Rfl: 3   Continuous Glucose Receiver (FREESTYLE LIBRE 2 READER) DEVI, USE TO MONITOR GLUCOSE CONTINUOUSLY AS DIRECTED, Disp: 1 each, Rfl: 0   Continuous Glucose Sensor (FREESTYLE LIBRE 2 SENSOR) MISC, CHANGE SENSOR EVERY 14 DAYS USE  TO CHECK BLOOD GLUCOSE  CONTINUOUSLY, Disp: 6 each, Rfl: 3   donepezil  (ARICEPT ) 5 MG tablet, Take 5 mg by mouth every evening., Disp: , Rfl:    empagliflozin  (JARDIANCE ) 25 MG TABS tablet, Take 1 tablet (25 mg total) by mouth daily., Disp: 90 tablet, Rfl: 1   estradiol  (ESTRACE ) 1 MG tablet, Take 1 mg by mouth at bedtime., Disp: , Rfl:    fenofibrate  160 MG tablet, Take 1 tablet (160 mg total) by mouth daily., Disp: 90 tablet, Rfl:  1   gabapentin  (NEURONTIN ) 100 MG capsule, Take 1 capsule (100 mg total) by mouth at bedtime., Disp: , Rfl:    glucose blood test strip, 1 each by Other route as needed. Use as instructed to test blood glucose four times daily, Disp: 100 each, Rfl: 2   insulin  degludec (TRESIBA  FLEXTOUCH) 200 UNIT/ML FlexTouch Pen, Inject 100 Units into the skin at bedtime., Disp: 24 mL, Rfl: 2   insulin  lispro (HUMALOG  KWIKPEN) 100 UNIT/ML KwikPen, Inject 10-16 Units into the skin 3 (three) times daily before meals., Disp: 30 mL, Rfl: 1   Insulin  Pen Needle (B-D ULTRAFINE III SHORT PEN) 31G X 8 MM MISC, 1 each by Does not apply route as directed., Disp: 100 each, Rfl: 2   isosorbide  mononitrate (IMDUR ) 30 MG 24 hr tablet, Take 3 tablets (90 mg total) by mouth daily., Disp: 270 tablet, Rfl: 3   levocetirizine (XYZAL ) 5 MG tablet, Take 5 mg by mouth every evening., Disp: , Rfl:    levothyroxine  (SYNTHROID ) 50 MCG tablet, TAKE 1 TABLET BY MOUTH DAILY  BEFORE BREAKFAST, Disp: 100 tablet, Rfl: 2   Magnesium  Oxide (MAGNESIUM  OXIDE 400) 240 MG PACK, Take 400 mg by mouth daily., Disp: , Rfl:     metFORMIN  (GLUCOPHAGE ) 500 MG tablet, TAKE 1 TABLET BY MOUTH DAILY  WITH BREAKFAST, Disp: 90 tablet, Rfl: 3   modafinil  (PROVIGIL ) 100 MG tablet, Take 1 tablet (100 mg total) by mouth daily., Disp: 90 tablet, Rfl: 1   montelukast  (SINGULAIR ) 10 MG tablet, Take 10 mg by mouth at bedtime., Disp: , Rfl:    OVER THE COUNTER MEDICATION, Take 2 capsules by mouth at bedtime. Omega XL, Disp: , Rfl:    pantoprazole  (PROTONIX ) 40 MG tablet, TAKE 2 TABLETS EVERY DAY, Disp: 180 tablet, Rfl: 1   rOPINIRole  (REQUIP ) 2 MG tablet, Take 2 mg by mouth at bedtime., Disp: , Rfl:    rosuvastatin  (CRESTOR ) 40 MG tablet, Take 1 tablet (40 mg total) by mouth daily., Disp: 30 tablet, Rfl: 5   Semaglutide ,0.25 or 0.5MG /DOS, (OZEMPIC , 0.25 OR 0.5 MG/DOSE,) 2 MG/1.5ML SOPN, Inject 0.5 mg into the skin every Thursday., Disp: 6 mL, Rfl: 0   Vibegron  (GEMTESA ) 75 MG TABS, Take 1 tablet (75 mg total) by mouth daily., Disp: 30 tablet, Rfl: 11   warfarin (COUMADIN ) 5 MG tablet, TAKE 1/2 TO 1 TABLET DAILY AS DIRECTED BY COUMADIN  CLINIC- 1 TAB ON MONDAY AND FRIDAYS AND 1/2 TAB ALL OTHER DAYS AS DIRECTED, Disp: 65 tablet, Rfl: 1 [2]  Social History Tobacco Use  Smoking Status Former   Current packs/day: 0.00   Average packs/day: 2.0 packs/day   Types: Cigarettes   Quit date: 08/17/1994   Years since quitting: 30.0  Smokeless Tobacco Never

## 2024-09-12 NOTE — Progress Notes (Addendum)
 Discharge Progress Report  Patient Details  Name: Rachel Odonnell MRN: [id number] Date of Birth: 11/23/1955 Referring Provider:   Flowsheet Row CARDIAC REHAB PHASE II ORIENTATION from 06/28/2024 in [facility] CARDIAC REHABILITATION  Referring Provider Alvan Carrier MD     Number of Visits: 2  Reason for Discharge:  Early Exit:  Personal, Lack of attendance, and going to local gym   Smoking History:  Tobacco Use History[1]  Diagnosis:  NSTEMI (non-ST elevated myocardial infarction) (HCC)  S/P CABG x 1  Status post primary angioplasty with coronary stent  ADL UCSD:   Initial Exercise Prescription:   Discharge Exercise Prescription (Final Exercise Prescription Changes):  Exercise Prescription Changes - 07/20/24 1500       Response to Exercise   Blood Pressure (Admit) 102/44    Blood Pressure (Exercise) 130/50    Blood Pressure (Exit) 102/50    Heart Rate (Admit) 95 bpm    Heart Rate (Exercise) 100 bpm    Heart Rate (Exit) 81 bpm    Rating of Perceived Exertion (Exercise) 13    Duration Continue with 30 min of aerobic exercise without signs/symptoms of physical distress.    Intensity THRR unchanged      Progression   Progression Continue to progress workloads to maintain intensity without signs/symptoms of physical distress.      Resistance Training   Training Prescription Yes    Weight 3    Reps 10-15      NuStep   Level 2    SPM 103    Minutes 15    METs 2.2      Arm Ergometer   Level 1    RPM 57    Minutes 15    METs 1.9          Functional Capacity:   Psychological, QOL, Others - Outcomes: PHQ 2/9:    07/31/2024    2:21 PM 06/28/2024    2:14 PM 05/19/2023    2:51 PM 10/10/2021    5:33 PM 07/30/2021    5:19 PM  Depression screen PHQ 2/9  Decreased Interest 0 0 0 0 0  Down, Depressed, Hopeless 0 0 0 1 1  PHQ - 2 Score 0 0 0 1 1  Altered sleeping  2 3    Tired, decreased energy  2 2    Change in appetite  0 0    Feeling bad or  failure about yourself   0 0    Trouble concentrating  0 0    Moving slowly or fidgety/restless  0 0    Suicidal thoughts  0 0    PHQ-9 Score  4 5     Difficult doing work/chores  Not difficult at all Not difficult at all       Data saved with a previous flowsheet row definition    Quality of Life:   Personal Goals: Goals established at orientation with interventions provided to work toward goal.    Personal Goals Discharge:   Exercise Goals and Review:   Exercise Goals Re-Evaluation:   Nutrition & Weight - Outcomes:    Nutrition:   Nutrition Discharge:   Education Questionnaire Score:   Goals reviewed with patient; copy given to patient.    [1]  Social History Tobacco Use  Smoking Status Former   Current packs/day: 0.00   Average packs/day: 2.0 packs/day   Types: Cigarettes   Quit date: 08/17/1994   Years since quitting: 30.0  Smokeless Tobacco Never

## 2024-09-13 ENCOUNTER — Ambulatory Visit (HOSPITAL_COMMUNITY)

## 2024-09-13 NOTE — Therapy (Incomplete)
 " OUTPATIENT PHYSICAL THERAPY THORACOLUMBAR AND CERVICAL EVALUATION   Patient Name: Rachel Odonnell MRN: [id number] DOB:[birth year], 69 y.o., female Today's Date: 09/13/2024  END OF SESSION:   Past Medical History:  Diagnosis Date   ADD (attention deficit disorder)    Allergy ?   Ive had seasonal allergies for years.   Anxiety 2006   Thats when I was first put on Celexa .   Arthritis Not sure   CAD (coronary artery disease)    Diabetes mellitus without complication (HCC)    I dont remember.   Emphysema of lung (HCC) 2022   Mild case   GERD (gastroesophageal reflux disease) Not sure   Heart murmur Not sure   Hypertension    PAF (paroxysmal atrial fibrillation) (HCC)    Sleep apnea 2022   Thyroid  disease 2000   Past Surgical History:  Procedure Laterality Date   ABDOMINAL HYSTERECTOMY  1984   APPENDECTOMY  Not sure   Appendix was taken out during my ovary removal.   COLONOSCOPY  02/2022   CORONARY ARTERY BYPASS GRAFT N/A 05/06/2024   Procedure: OFF PUMP CORONARY ARTERY BYPASS GRAFTING (CABG) TIMES ONE USING LEFT INTERNAL MAMMARY ARTERY;  Surgeon: Shyrl Linnie KIDD, MD;  Location: [facility] OR;  Service: Open Heart Surgery;  Laterality: N/A;   CORONARY PRESSURE/FFR STUDY N/A 04/16/2023   Procedure: CORONARY PRESSURE/FFR STUDY;  Surgeon: Jordan, Peter M, MD;  Location: [facility] INVASIVE CV LAB;  Service: Cardiovascular;  Laterality: N/A;   CORONARY STENT INTERVENTION N/A 04/16/2023   Procedure: CORONARY STENT INTERVENTION;  Surgeon: Jordan, Peter M, MD;  Location: [facility] INVASIVE CV LAB;  Service: Cardiovascular;  Laterality: N/A;   CORONARY STENT INTERVENTION N/A 07/28/2024   Procedure: CORONARY STENT INTERVENTION;  Surgeon: Verlin Lonni BIRCH, MD;  Location: [facility] INVASIVE CV LAB;  Service: Cardiovascular;  Laterality: N/A;   CORONARY ULTRASOUND/IVUS N/A 04/16/2023   Procedure: Coronary Ultrasound/IVUS;  Surgeon: Jordan, Peter M, MD;  Location: [facility] INVASIVE CV LAB;  Service:  Cardiovascular;  Laterality: N/A;   EXCISIONAL HEMORRHOIDECTOMY     INCONTINENCE SURGERY     INTRAOPERATIVE TRANSESOPHAGEAL ECHOCARDIOGRAM N/A 05/06/2024   Procedure: ECHOCARDIOGRAM, TRANSESOPHAGEAL, INTRAOPERATIVE;  Surgeon: Shyrl Linnie KIDD, MD;  Location: [facility] OR;  Service: Open Heart Surgery;  Laterality: N/A;   LEFT HEART CATH AND CORONARY ANGIOGRAPHY N/A 05/04/2024   Procedure: LEFT HEART CATH AND CORONARY ANGIOGRAPHY;  Surgeon: Darron Deatrice LABOR, MD;  Location: [facility] INVASIVE CV LAB;  Service: Cardiovascular;  Laterality: N/A;   LEFT HEART CATH AND CORS/GRAFTS ANGIOGRAPHY N/A 07/28/2024   Procedure: LEFT HEART CATH AND CORS/GRAFTS ANGIOGRAPHY;  Surgeon: Verlin Lonni BIRCH, MD;  Location: [facility] INVASIVE CV LAB;  Service: Cardiovascular;  Laterality: N/A;   RIGHT/LEFT HEART CATH AND CORONARY ANGIOGRAPHY N/A 06/13/2021   Procedure: RIGHT/LEFT HEART CATH AND CORONARY ANGIOGRAPHY;  Surgeon: Jordan, Peter M, MD;  Location: [facility] INVASIVE CV LAB;  Service: Cardiovascular;  Laterality: N/A;   RIGHT/LEFT HEART CATH AND CORONARY ANGIOGRAPHY N/A 04/16/2023   Procedure: RIGHT/LEFT HEART CATH AND CORONARY ANGIOGRAPHY;  Surgeon: Jordan, Peter M, MD;  Location: [facility] INVASIVE CV LAB;  Service: Cardiovascular;  Laterality: N/A;   TUBAL LIGATION  1982   After my last child   Patient Active Problem List   Diagnosis Date Noted   Depression 07/29/2024   Obesity, class 2 07/28/2024   Chest pain 07/25/2024   Orthostatic hypotension 05/09/2024   S/P CABG x 1 05/06/2024   NSTEMI (non-ST elevated myocardial infarction) (HCC) 05/03/2024   ACS (acute coronary syndrome) (HCC)  05/02/2024   Dyslipidemia 05/02/2024   Type 2 diabetes mellitus with peripheral neuropathy (HCC) 05/02/2024   OSA on CPAP 05/02/2024   Anxiety and depression 05/02/2024   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 04/30/2024   OAB (overactive bladder) 07/29/2023   Recurrent UTI 07/29/2023   CAD (coronary artery disease) 04/17/2023   Mild pulmonary  hypertension (HCC) 04/17/2023   Angina pectoris 04/16/2023   Presence of urogenital implant 02/16/2023   History of stress incontinence 02/16/2023   Urge incontinence 02/16/2023   Urinary urgency 02/16/2023   Insulin  long-term use (HCC) 01/28/2023   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   Paroxysmal atrial fibrillation (HCC) 01/01/2022   History of colon polyps 12/31/2021   Mixed hyperlipidemia 12/16/2021   Hypothyroidism 12/16/2021   Vitamin D  deficiency 12/16/2021   Morbid obesity (HCC) 12/16/2021   Non-alcoholic fatty liver disease 12/16/2021   Tremor 11/17/2021   Memory loss 11/17/2021   Hypersomnolence 09/24/2021   Restless legs syndrome (RLS) 09/24/2021   Unstable angina (HCC) 06/13/2021   Centrilobular emphysema (HCC) 05/14/2021   Obstructive sleep apnea 05/14/2021   Chronic stable angina 05/14/2021   DM type 2 causing vascular disease (HCC) 05/31/2016   Essential hypertension, benign 05/31/2016   Migraine with aura 05/31/2016   Overweight 11/03/2011   Kidney stones 11/03/2011    PCP: Jeanette Comer BRAVO, PA-C  REFERRING PROVIDER: Lari Elspeth BRAVO, MD  REFERRING DIAG: M54.41 (ICD-10-CM) - Lumbago with sciatica, right side M54.12 (ICD-10-CM) - Radiculopathy, cervical region  Rationale for Evaluation and Treatment: Rehabilitation  THERAPY DIAG:  No diagnosis found.  ONSET DATE: ***  SUBJECTIVE:                                                                                                                                                                                           SUBJECTIVE STATEMENT: ***  PERTINENT HISTORY:  ***  PAIN:  Are you having pain? {OPRCPAIN:27236}  PRECAUTIONS: {Therapy precautions:24002}  RED FLAGS: {PT Red Flags:29287}   WEIGHT BEARING RESTRICTIONS: {Yes ***/No:24003}  FALLS:  Has patient fallen in last 6 months? {fallsyesno:27318}  LIVING ENVIRONMENT: Lives with: {OPRC lives with:25569::lives with their  family} Lives in: {Lives in:25570} Stairs: {opstairs:27293} Has following equipment at home: {Assistive devices:23999}  OCCUPATION: ***  PLOF: {PLOF:24004}  PATIENT GOALS: ***  NEXT MD VISIT: ***  OBJECTIVE:  Note: Objective measures were completed at Evaluation unless otherwise noted.  DIAGNOSTIC FINDINGS:  ***  PATIENT SURVEYS:  {rehab surveys:24030}  COGNITION: Overall cognitive status: {cognition:24006}     SENSATION: {sensation:27233}  MUSCLE LENGTH: Hamstrings: Right *** deg; Left *** deg Debby test: Right *** deg; Left *** deg  POSTURE: {posture:25561}  PALPATION: ***  LUMBAR ROM:   AROM eval  Flexion   Extension   Right lateral flexion   Left lateral flexion   Right rotation   Left rotation    (Blank rows = not tested)  LOWER EXTREMITY ROM:     {AROM/PROM:27142}  Right eval Left eval  Hip flexion    Hip extension    Hip abduction    Hip adduction    Hip internal rotation    Hip external rotation    Knee flexion    Knee extension    Ankle dorsiflexion    Ankle plantarflexion    Ankle inversion    Ankle eversion     (Blank rows = not tested)  LOWER EXTREMITY MMT:    MMT Right eval Left eval  Hip flexion    Hip extension    Hip abduction    Hip adduction    Hip internal rotation    Hip external rotation    Knee flexion    Knee extension    Ankle dorsiflexion    Ankle plantarflexion    Ankle inversion    Ankle eversion     (Blank rows = not tested)  LUMBAR SPECIAL TESTS:  {lumbar special test:25242}  FUNCTIONAL TESTS:  {Functional tests:24029} CERVICAL ROM:   {AROM/PROM:27142} ROM A/PROM (deg) eval  Flexion   Extension   Right lateral flexion   Left lateral flexion   Right rotation   Left rotation    (Blank rows = not tested)  UPPER EXTREMITY ROM:  {AROM/PROM:27142} ROM Right eval Left eval  Shoulder flexion    Shoulder extension    Shoulder abduction    Shoulder adduction    Shoulder extension     Shoulder internal rotation    Shoulder external rotation    Elbow flexion    Elbow extension    Wrist flexion    Wrist extension    Wrist ulnar deviation    Wrist radial deviation    Wrist pronation    Wrist supination     (Blank rows = not tested)  UPPER EXTREMITY MMT:  MMT Right eval Left eval  Shoulder flexion    Shoulder extension    Shoulder abduction    Shoulder adduction    Shoulder extension    Shoulder internal rotation    Shoulder external rotation    Middle trapezius    Lower trapezius    Elbow flexion    Elbow extension    Wrist flexion    Wrist extension    Wrist ulnar deviation    Wrist radial deviation    Wrist pronation    Wrist supination    Grip strength     (Blank rows = not tested)  CERVICAL SPECIAL TESTS:  {Cervical special tests:25246} GAIT: Distance walked: *** Assistive device utilized: {Assistive devices:23999} Level of assistance: {Levels of assistance:24026} Comments: ***  TREATMENT DATE: 09/13/2024 physical therapy evaluation and HEP instruction  PATIENT EDUCATION:  Education details: Patient educated on exam findings, POC, scope of PT, HEP, and ***. Person educated: Patient Education method: Explanation, Demonstration, and Handouts Education comprehension: verbalized understanding, returned demonstration, verbal cues required, and tactile cues required  HOME EXERCISE PROGRAM: ***  ASSESSMENT:  CLINICAL IMPRESSION: Patient is a 69 y.o. female who was seen today for physical therapy evaluation and treatment for M54.41 (ICD-10-CM) - Lumbago with sciatica, right side M54.12 (ICD-10-CM) - Radiculopathy, cervical region.   OBJECTIVE IMPAIRMENTS: {opptimpairments:25111}.   ACTIVITY LIMITATIONS: {activitylimitations:27494}  PARTICIPATION LIMITATIONS: {participationrestrictions:25113}  PERSONAL  FACTORS: {Personal factors:25162} are also affecting patient's functional outcome.   REHAB POTENTIAL: Good  CLINICAL DECISION MAKING: Evolving/moderate complexity  EVALUATION COMPLEXITY: Moderate   GOALS: Goals reviewed with patient? No  SHORT TERM GOALS: Target date: ***  patient will be independent with initial HEP and compliant with HEP 3-4 times a week   Baseline: Goal status: INITIAL  2.  Patient will report 50% improvement overall  Baseline:  Goal status: INITIAL  3.  *** Baseline:  Goal status: INITIAL  4.  *** Baseline:  Goal status: INITIAL  5.  *** Baseline:  Goal status: INITIAL  6.  *** Baseline:  Goal status: INITIAL  LONG TERM GOALS: Target date: ***  Patient will be independent in self management strategies to improve quality of life and functional outcomes.  Baseline:  Goal status: INITIAL  2.  Patient will report 70% improvement overall  Baseline:  Goal status: INITIAL  3.  *** Baseline:  Goal status: INITIAL  4.  *** Baseline:  Goal status: INITIAL  5.  *** Baseline:  Goal status: INITIAL  6.  *** Baseline:  Goal status: INITIAL  PLAN:  PT FREQUENCY: {rehab frequency:25116}  PT DURATION: {rehab duration:25117}  PLANNED INTERVENTIONS: {rehab planned interventions:25118::97110-Therapeutic exercises,97530- Therapeutic [phone]- Neuromuscular re-education,97535- Self Rjmz,02859- Manual therapy,Patient/Family education}.  PLAN FOR NEXT SESSION: ***   Greig GORMAN Quivers, PT 09/13/2024, 7:31 AM  "

## 2024-09-14 ENCOUNTER — Encounter (HOSPITAL_COMMUNITY)

## 2024-09-18 ENCOUNTER — Ambulatory Visit

## 2024-09-19 ENCOUNTER — Ambulatory Visit

## 2024-09-19 ENCOUNTER — Encounter (HOSPITAL_COMMUNITY)

## 2024-09-20 ENCOUNTER — Ambulatory Visit

## 2024-09-20 DIAGNOSIS — Z5181 Encounter for therapeutic drug level monitoring: Secondary | ICD-10-CM

## 2024-09-20 DIAGNOSIS — I4821 Permanent atrial fibrillation: Secondary | ICD-10-CM | POA: Diagnosis not present

## 2024-09-20 LAB — POCT INR: INR: 1.6 — AB (ref 2.0–3.0)

## 2024-09-20 NOTE — Progress Notes (Signed)
 INR-1.6

## 2024-09-20 NOTE — Patient Instructions (Signed)
 Take warfarin 1 tablet tonight and tomorrow night then resume 1/2 tablet daily except 1 tablet on Mondays and Fridays Recheck INR in 2 weeks

## 2024-09-21 ENCOUNTER — Encounter (HOSPITAL_COMMUNITY)

## 2024-09-21 ENCOUNTER — Encounter: Payer: Self-pay | Admitting: Nurse Practitioner

## 2024-09-21 ENCOUNTER — Ambulatory Visit: Admitting: Nurse Practitioner

## 2024-09-21 NOTE — Progress Notes (Unsigned)
 " Cardiology Office Note:  .   Date: 08/11/2024 ID:  Rachel Odonnell, DOB 1956/02/24, MRN [id number] PCP: Jeanette Comer Odonnell Rachel  [facility] Providers Cardiologist:  Jayson Sierras, MD Cardiology APP:  Miriam Norris, NP    History of Present Illness: Rachel Odonnell is a 69 y.o. female with a PMH of CAD, HLD, HTN, PAF, mild aortic valve stenosis, and OSA (compliant with CPAP), who presents today for scheduled follow-up.   Prior history of DES x 1 to prox to mid LAD, abrupt occlusion of diagonal that was jailed by stent. Successfully treated with POBA in August 2024.   Seen by [name], NP on 04/23/2023 after cardiac catheterization. Was overall feeling better but felt few episodes of fleeting chest pain. Was staying active. Was cleared to start cardiac rehab.   05/25/2023 - Today she presents for 1 month follow-up. She says her chest pain seems to be improved slightly, but not significantly. Describes chest pain as dull ache along left lateral side of chest wall, nonradiating. Denies any alleviating or aggravating factors, however says she takes NTG when this occurs, lays down, and pain is resolved. 5/10 on 0-10 pain scale. Says she is tired, not very active. Says she is not taking provigil . Says if she takes this medicine, she will not be able to get sleep and will up awake all night. Denies any shortness of breath, palpitations, syncope, presyncope, dizziness, orthopnea, PND, swelling or significant weight changes, acute bleeding, or claudication.  07/06/2023 - Presents for follow-up today with her husband.  She says that her and her husband have returned from a cruise not that long ago.  Has been recently getting over some sinusitis and bronchitis. She continues to notice CP, not as often compared to last office visit, says adjustment with Imdur  has helped her symptoms. Continues to note fatigue. Denies any shortness of breath, palpitations, syncope, presyncope, dizziness,  orthopnea, PND, swelling or significant weight changes, acute bleeding, or claudication.  She is requesting to stop cardiac rehab at this point and just exercise at [employer].  She also sees a nutritionist regularly.  09/06/2023 - Presents today for follow-up. Still having extreme fatigue. Denies any other changes to her health.  Denies any chest pain, shortness of breath, palpitations, syncope, presyncope, dizziness, orthopnea, PND, swelling or significant weight changes, acute bleeding, or claudication.  She was hospitalized in September 2025 she noted worsening chest pain at that time and was admitted for further evaluation, was evaluated for CABG.  Was taken to the OR on May 06, 2024 and underwent CABG x 1, tolerated procedure well.  Did have some A-fib but was not felt to be a candidate for amiodarone therapy due to bradycardia.  Recently in the hospital earlier this month for unstable angina.  She underwent left heart cath that showed severe restenosis of proximal LAD stented segment with patent LIMA to LAD and stable moderate obtuse marginal branch stenosis.  Received successful PTCA/DES x 1 to proximal to mid RCA.  Recommended to continue DAPT with aspirin  and Plavix  for at least 6 months.  Echo revealed LVEF normal, grade 1 DD, severe thickening of the aortic valve with no aortic valve stenosis.  She is here for scheduled follow-up.  She states she is doing well.  Does have some generalized soreness since her open heart surgery along her chest, she is pending dental cleaning in March 2026.  Overall doing well since her procedure, does have some slight bruising along  the left breast from previous heart cath.  Overall doing very well.  She is requesting to return to taking Provigil  as this helped her narcolepsy, difficult for her to get tasks done during the day. Denies any shortness of breath, palpitations, syncope, presyncope, dizziness, orthopnea, PND, swelling or significant weight  changes, acute bleeding, or claudication.  ROS: Negative. See HPI.   Studies Reviewed: SABRA    LHC 07/2024:    1st Diag lesion is 60% stenosed.   1st Mrg lesion is 60% stenosed.   Prox LAD lesion is 95% stenosed.   Prox RCA lesion is 99% stenosed.   A drug-eluting stent was successfully placed using a STENT ONYX FRONTIER [id number].   Post intervention, there is a 0% residual stenosis.   LIMA graft was visualized by angiography and is normal in caliber.   Severe restenosis proximal LAD stented segment. Patent LIMA to LAD Stable moderate obtuse marginal branch stenosis. Unchanged from last two caths.  Severe stenosis in the proximal RCA Successful PTCA/DES x 1 proximal to mid RCA Normal LV systolic function   Recommendations: Continue DAPT with ASA and Plavix  for at least six months. Discharge home tomorrow.   Echo 07/2024:  1. Very distal apical hypokinesis noted with use of echocontrast. Left  ventricular ejection fraction, by estimation, is 60 to 65%. The left  ventricle has normal function. The left ventricle has no regional wall  motion abnormalities. There is mild left  ventricular hypertrophy. Left ventricular diastolic parameters are  consistent with Grade I diastolic dysfunction (impaired relaxation).   2. Right ventricular systolic function is normal. The right ventricular  size is normal.   3. The mitral valve is normal in structure. No evidence of mitral valve  regurgitation. No evidence of mitral stenosis.   4. The aortic valve is tricuspid. There is severe calcifcation of the  aortic valve. There is severe thickening of the aortic valve. Aortic valve  regurgitation is not visualized. No aortic stenosis is present.   5. The inferior vena cava is normal in size with greater than 50%  respiratory variability, suggesting right atrial pressure of 3 mmHg.  Vascular ultrasound 04/2024:  Summary:  Right Carotid: Velocities in the right ICA are consistent with a 1-39%   stenosis.   Left Carotid: Velocities in the left ICA are consistent with a 1-39%  stenosis.  Vertebrals: Bilateral vertebral arteries demonstrate antegrade flow.  Subclavians: Normal flow hemodynamics were seen in bilateral subclavian               arteries.   Right ABI: Resting right ankle-brachial index is within normal range. The  right toe-brachial index is abnormal.  Left ABI: Resting left ankle-brachial index is within normal range. The  left toe-brachial index is abnormal.  Right Upper Extremity: Doppler waveforms remain within normal limits with  right radial compression. Doppler waveforms decrease <50% with right ulnar  compression.  Left Upper Extremity: Doppler waveforms decrease 50% with left radial  compression. Doppler waveforms decrease <50% with left ulnar compression.    LHC 04/2024:   Prox RCA to Mid RCA lesion is 35% stenosed.   1st Mrg lesion is 60% stenosed.   Prox LAD lesion is 95% stenosed.   1st Diag lesion is 60% stenosed.   1.  Severe one-vessel coronary artery disease due to in-stent restenosis in the proximal portion of the LAD stent that was placed last year.  Stable left circumflex disease. 2.  Left ventricular angiography was not performed.  EF was  normal by echo. 3.  Mild gradient across the aortic valve. 4.  Severe right radial artery spasm.   Recommendations: The patient has very aggressive restenosis in the proximal LAD.  Management options include CABG versus drug-coated balloon angioplasty.  Will discuss during the heart meeting tomorrow.  If PCI is pursued, it will have to be done via the femoral approach. Resume heparin  drip 2 hours after TR band removal. I held clopidogrel  for now in case CABG is needed.  Right/left heart cath 03/2023:    Prox RCA to Mid RCA lesion is 35% stenosed.   1st Mrg lesion is 60% stenosed.   Prox LAD to Mid LAD lesion is 60% stenosed.   A drug-eluting stent was successfully placed using a SYNERGY XD 3.0X38.    Post intervention, there is a 0% residual stenosis.   1st Diag lesion is 40% stenosed.   Balloon angioplasty was performed using a BALLN SAPPHIRE 2.0X12.   Post intervention, there is a 0% residual stenosis.   The left ventricular systolic function is normal.   LV end diastolic pressure is mildly elevated.   The left ventricular ejection fraction is 55-65% by visual estimate.   Hemodynamic findings consistent with mild pulmonary hypertension.   Recommend to resume Warfarin, at currently prescribed dose and frequency on 04/17/2023.   Recommend concurrent antiplatelet therapy of Aspirin  81 mg for 1 month and Clopidogrel  75mg  daily for 6 months .   Single vessel obstructive CAD. Long segment of disease in the proximal to mid LAD. Angiographically this did not appear to be severe but flow was impaired on both CT FFR and directly measured RFR. Severe plaque on IVUS with Minimal lumen diameter of 2.5 mm squared.  Normal LV function. Mildly elevated LV filling pressures. PCWP 21/23 mean 16 mm Hg. LVEDP 21 mm Hg Mild pulmonary HTN PAP 40/14 mean 27 mm Hg Cardiac output 4.3 L/min with index 2.08. Successful PCI of the proximal to mid LAD with IVUS guidance and DES x 1. Abrupt occlusion of diagonal that was jailed by stent. Successfully treated with POBA through the stent struts.    Plan: will monitor overnight on telemetry. DAPT with ASA for one month and Plavix  for 6 months. May resume Coumadin  tomorrow if no bleeding problems.   Echo 02/2023:   1. Left ventricular ejection fraction, by estimation, is 60 to 65%. Left  ventricular ejection fraction by 3D volume is 63 %. The left ventricle has  normal function. The left ventricle has no regional wall motion  abnormalities. Left ventricular diastolic   parameters are consistent with Grade I diastolic dysfunction (impaired  relaxation). The average left ventricular global longitudinal strain is  -20.2 %. The global longitudinal strain is normal.   2.  Right ventricular systolic function is normal. The right ventricular  size is normal.   3. The mitral valve is grossly normal. Trivial mitral valve  regurgitation. No evidence of mitral stenosis.   4. The aortic valve is calcified. There is mild calcification of the  aortic valve. There is mild thickening of the aortic valve. Aortic valve  regurgitation is not visualized. Mild aortic valve stenosis. Aortic valve  area, by VTI measures 1.55 cm.  Aortic valve mean gradient measures 12.0 mmHg. Aortic valve Vmax measures  2.32 m/s.  CCTA 02/2023: IMPRESSION: 1. Coronary calcium  score of 153. This was 83rd percentile for age-, sex, and race-matched controls.   2. Total plaque volume 313 mm3 which is 64th percentile for age- and sex-matched controls (calcified  plaque 59 mm3; non-calcified plaque 254 mm3). TPV is severe.   3. Normal coronary origin with right dominance.   4. There is moderate (50-69%) calcified plaque in the LAD and LCX. CAD-RADS 3.   5. Will send study for FFRct.  5. Aortic atherosclerosis.   IMPRESSION: 1. FFRct findings are consistent with significant stenosis in the mid LAD.   2.  Recommend cardiac catheterization.  Cardiac monitor 04/2021:  12 day monitor Rare supraventricular ectopy in the form of isolated PACs, couplets, triplets. Fourteen episodes of SVT longest 8 beats Rare ventricular ectopy in the form of isolated PVCs, couplets. Four episodes of NSVT longest 15 beats. Reported symptoms correlated with sinus rhythm, PACs, PVCs, and afib Episodes of afib <1% burden, rates were controlled Single nocturnal pause 3.1 seconds     Patch Wear Time:  12 days and 7 hours (2022-08-22T11:20:36-0400 to 2022-09-03T18:39:38-0400)   Patient had a min HR of 45 bpm, max HR of 128 bpm, and avg HR of 58 bpm. Predominant underlying rhythm was Sinus Rhythm. First Degree AV Block was present. 4 Ventricular Tachycardia runs occurred, the run with the fastest interval  lasting 15 beats with a  max rate of 128 bpm (avg 119 bpm); the run with the fastest interval was also the longest. 14 Supraventricular Tachycardia runs occurred, the run with the fastest interval lasting 8 beats with a max rate of 121 bpm (avg 99 bpm); the run with the fastest  interval was also the longest. Atrial Fibrillation occurred (<1% burden), ranging from 64-116 bpm (avg of 82 bpm), the longest lasting 4 mins 51 secs with an avg rate of 79 bpm. 1 Pause occurred lasting 3.1 secs (19 bpm). Atrial Fibrillation was  detected within +/- 45 seconds of symptomatic patient event(s). Isolated SVEs were rare (<1.0%), SVE Couplets were rare (<1.0%), and SVE Triplets were rare (<1.0%). Isolated VEs were rare (<1.0%), VE Couplets were rare (<1.0%), and no VE Triplets were  present. Risk Assessment/Calculations:    CHA2DS2-VASc Score = 5  This indicates a 7.2% annual risk of stroke. The patient's score is based upon: CHF History: 0 HTN History: 1 Diabetes History: 1 Stroke History: 0 Vascular Disease History: 1 Age Score: 1 Gender Score: 1      Physical Exam:   VS:  There were no vitals taken for this visit.   Wt Readings from Last 3 Encounters:  08/14/24 218 lb 6.4 oz (99.1 kg)  08/11/24 220 lb (99.8 kg)  08/02/24 213 lb (96.6 kg)    GEN: Obese, 69 year old female in no acute distress NECK: No JVD; No carotid bruits CARDIAC: S1/S2, irregularly irregular rhythm, Grade 1 murmur, no rubs or gallops, sternal incision is well-approximated and healing well RESPIRATORY:  Clear to auscultation without rales, wheezing or rhonchi  EXTREMITIES:  No edema; No deformity   ASSESSMENT AND PLAN: .    CAD, s/p CABG in 04/2024 Denies any chest pain, see cardiac catheterization reports noted above.  Continue current medication regimen. Heart healthy diet and regular cardiovascular exercise encouraged. Care and ED precautions discussed.   HLD, hypertriglyceridemia LDL 70 07/2024. She is not at goal. I  offered/ recommended at medication adjustment, pt defers to her Endocrinologist. LDL goal < 55. Continue current medication regimen. Heart healthy diet and regular cardiovascular exercise encouraged.    3. HTN BP stable. Discussed to monitor BP at home at least 2 hours after medications and sitting for 5-10 minutes.  No medication changes at this time. Heart healthy diet and regular  cardiovascular exercise encouraged.   4. A-fib Denies any tachycardia or palpitations. HR well controlled today. Followed closely at Coumadin  Clinic. Continue Coumadin , denies any bleeding issues.   5. Fatigue Etiology multifactorial.  Does have hx of narcolepsy.  Wants to return to taking Provigil .  Ran past clinical pharmacist today.  Discussed in office and also left a MyChart message regarding caution due to her cardiac history.  Recommended she speak with prescribing provider.  Compliant with her CPAP. She verbalized understanding.    6.  Obesity Weight loss via diet and exercise encouraged. Discussed the impact being overweight would have on cardiovascular risk.  7. Pre-op clearance Ms. Kulick's perioperative risk of a major cardiac event is  % according to the Revised Cardiac Risk Index (RCRI).  Therefore, she is at high risk for perioperative complications.   Her functional capacity is good at > 4 METs according to the Duke Activity Status Index (DASI). Recommendations: According to ACC/AHA guidelines, no further cardiovascular testing needed.  The patient may proceed to surgery at acceptable risk.   Antiplatelet and/or Anticoagulation Recommendations: The patient should remain on Aspirin  without interruption.   For routine dental procedures, including standard dental cleanings, x-rays, simple fillings, crowns and local anesthetic injections, Plavix  is typically not stopped.  The bleeding risk is low and can almost always be managed with local measures.  Nor does Coumadin  need to be held for a dental cleaning  per current guidelines.   Dispo: Follow-up with Dr. Debera or APP in 6-8 weeks or sooner if anything changes.   Signed, [name], NP   "

## 2024-09-21 NOTE — Patient Instructions (Signed)
 Medication Instructions:  Your physician recommends that you continue on your current medications as directed. Please refer to the Current Medication list given to you today.  *If you need a refill on your cardiac medications before your next appointment, please call your pharmacy*  Lab Work: NONE   If you have labs (blood work) drawn today and your tests are completely normal, you will receive your results only by: MyChart Message (if you have MyChart) OR A paper copy in the mail If you have any lab test that is abnormal or we need to change your treatment, we will call you to review the results.  Testing/Procedures: NONE   Follow-Up: At [facility], you and your health needs are our priority.  As part of our continuing mission to provide you with exceptional heart care, our providers are all part of one team.  This team includes your primary Cardiologist (physician) and Advanced Practice Providers or APPs (Physician Assistants and Nurse Practitioners) who all work together to provide you with the care you need, when you need it.  Your next appointment:   6 month(s)  Provider:   [name], NP    We recommend signing up for the patient portal called MyChart.  Sign up information is provided on this After Visit Summary.  MyChart is used to connect with patients for Virtual Visits (Telemedicine).  Patients are able to view lab/test results, encounter notes, upcoming appointments, etc.  Non-urgent messages can be sent to your provider as well.   To learn more about what you can do with MyChart, go to [email or web address].   Other Instructions Thank you for choosing [facility]!

## 2024-09-26 ENCOUNTER — Encounter (HOSPITAL_COMMUNITY)

## 2024-09-28 ENCOUNTER — Encounter (HOSPITAL_COMMUNITY)

## 2024-09-28 ENCOUNTER — Ambulatory Visit (HOSPITAL_BASED_OUTPATIENT_CLINIC_OR_DEPARTMENT_OTHER): Admitting: Pulmonary Disease

## 2024-10-02 ENCOUNTER — Ambulatory Visit: Admitting: Nurse Practitioner

## 2024-10-02 ENCOUNTER — Ambulatory Visit

## 2024-10-03 ENCOUNTER — Encounter (HOSPITAL_COMMUNITY)

## 2024-10-04 ENCOUNTER — Ambulatory Visit

## 2024-10-05 ENCOUNTER — Encounter (HOSPITAL_COMMUNITY)

## 2024-10-10 ENCOUNTER — Encounter (HOSPITAL_COMMUNITY)

## 2024-10-10 ENCOUNTER — Ambulatory Visit (HOSPITAL_COMMUNITY)

## 2024-10-12 ENCOUNTER — Encounter (HOSPITAL_COMMUNITY)

## 2024-10-17 ENCOUNTER — Encounter (HOSPITAL_COMMUNITY)

## 2024-10-19 ENCOUNTER — Encounter (HOSPITAL_COMMUNITY)

## 2024-10-24 ENCOUNTER — Encounter (HOSPITAL_COMMUNITY)

## 2024-10-26 ENCOUNTER — Encounter (HOSPITAL_COMMUNITY)

## 2024-10-31 ENCOUNTER — Encounter (HOSPITAL_COMMUNITY)

## 2024-10-31 ENCOUNTER — Ambulatory Visit: Admitting: Cardiology

## 2024-11-02 ENCOUNTER — Encounter (HOSPITAL_COMMUNITY)

## 2024-11-07 ENCOUNTER — Encounter (HOSPITAL_COMMUNITY)

## 2024-11-22 ENCOUNTER — Ambulatory Visit: Admitting: "Endocrinology

## 2025-01-31 ENCOUNTER — Ambulatory Visit: Admitting: Urology
